# Patient Record
Sex: Male | Born: 1950
Health system: Southern US, Community
[De-identification: ages and names within clinical notes are randomized; demographics above are authoritative.]

## PROBLEM LIST (undated history)

## (undated) DIAGNOSIS — Z9289 Personal history of other medical treatment: Secondary | ICD-10-CM

## (undated) DIAGNOSIS — R0600 Dyspnea, unspecified: Secondary | ICD-10-CM

## (undated) DIAGNOSIS — I2510 Atherosclerotic heart disease of native coronary artery without angina pectoris: Secondary | ICD-10-CM

## (undated) DIAGNOSIS — S0590XA Unspecified injury of unspecified eye and orbit, initial encounter: Secondary | ICD-10-CM

## (undated) DIAGNOSIS — I5042 Chronic combined systolic (congestive) and diastolic (congestive) heart failure: Secondary | ICD-10-CM

## (undated) DIAGNOSIS — I4821 Permanent atrial fibrillation: Secondary | ICD-10-CM

## (undated) DIAGNOSIS — L02419 Cutaneous abscess of limb, unspecified: Secondary | ICD-10-CM

## (undated) DIAGNOSIS — E1165 Type 2 diabetes mellitus with hyperglycemia: Secondary | ICD-10-CM

## (undated) DIAGNOSIS — I428 Other cardiomyopathies: Secondary | ICD-10-CM

## (undated) DIAGNOSIS — Z9989 Dependence on other enabling machines and devices: Secondary | ICD-10-CM

## (undated) DIAGNOSIS — I10 Essential (primary) hypertension: Secondary | ICD-10-CM

## (undated) DIAGNOSIS — N390 Urinary tract infection, site not specified: Secondary | ICD-10-CM

## (undated) DIAGNOSIS — Z789 Other specified health status: Secondary | ICD-10-CM

## (undated) DIAGNOSIS — T4145XA Adverse effect of unspecified anesthetic, initial encounter: Secondary | ICD-10-CM

## (undated) DIAGNOSIS — M1990 Unspecified osteoarthritis, unspecified site: Secondary | ICD-10-CM

## (undated) DIAGNOSIS — L03119 Cellulitis of unspecified part of limb: Secondary | ICD-10-CM

## (undated) DIAGNOSIS — G4733 Obstructive sleep apnea (adult) (pediatric): Secondary | ICD-10-CM

## (undated) HISTORY — DX: Type 2 diabetes mellitus with hyperglycemia: E11.65

## (undated) HISTORY — DX: Cutaneous abscess of limb, unspecified: L02.419

## (undated) HISTORY — PX: PLACEMENT AND SUTURE OF SECONDARY INTRAOCULAR LENS: SHX5338

## (undated) HISTORY — PX: EYE MUSCLE SURGERY: SHX370

## (undated) HISTORY — DX: Urinary tract infection, site not specified: N39.0

## (undated) HISTORY — PX: TONSILLECTOMY: SUR1361

## (undated) HISTORY — DX: Unspecified injury of unspecified eye and orbit, initial encounter: S05.90XA

## (undated) HISTORY — DX: Cellulitis of unspecified part of limb: L03.119

## (undated) HISTORY — PX: RETINAL DETACHMENT SURGERY: SHX105

## (undated) HISTORY — PX: CARDIOVERSION: SHX1299

## (undated) HISTORY — PX: CORNEAL TRANSPLANT: SHX108

---

## 2005-03-08 HISTORY — PX: EYE SURGERY: SHX253

## 2005-03-11 ENCOUNTER — Observation Stay (HOSPITAL_COMMUNITY): Admission: EM | Admit: 2005-03-11 | Discharge: 2005-03-12 | Payer: Self-pay | Admitting: Emergency Medicine

## 2005-03-31 ENCOUNTER — Ambulatory Visit (HOSPITAL_COMMUNITY): Admission: RE | Admit: 2005-03-31 | Discharge: 2005-04-01 | Payer: Self-pay | Admitting: Ophthalmology

## 2008-08-04 ENCOUNTER — Ambulatory Visit: Payer: Self-pay | Admitting: *Deleted

## 2008-08-04 ENCOUNTER — Ambulatory Visit: Payer: Self-pay | Admitting: Family Medicine

## 2008-08-04 ENCOUNTER — Inpatient Hospital Stay (HOSPITAL_COMMUNITY): Admission: EM | Admit: 2008-08-04 | Discharge: 2008-08-13 | Payer: Self-pay | Admitting: Emergency Medicine

## 2008-08-04 DIAGNOSIS — I4821 Permanent atrial fibrillation: Secondary | ICD-10-CM

## 2008-08-04 DIAGNOSIS — L03119 Cellulitis of unspecified part of limb: Secondary | ICD-10-CM

## 2008-08-04 DIAGNOSIS — N390 Urinary tract infection, site not specified: Secondary | ICD-10-CM

## 2008-08-04 DIAGNOSIS — L02419 Cutaneous abscess of limb, unspecified: Secondary | ICD-10-CM

## 2008-08-04 DIAGNOSIS — R188 Other ascites: Secondary | ICD-10-CM

## 2008-08-04 DIAGNOSIS — E1159 Type 2 diabetes mellitus with other circulatory complications: Secondary | ICD-10-CM

## 2008-08-04 DIAGNOSIS — IMO0001 Reserved for inherently not codable concepts without codable children: Secondary | ICD-10-CM

## 2008-08-04 DIAGNOSIS — I4891 Unspecified atrial fibrillation: Secondary | ICD-10-CM

## 2008-08-04 HISTORY — DX: Cutaneous abscess of limb, unspecified: L02.419

## 2008-08-04 HISTORY — DX: Urinary tract infection, site not specified: N39.0

## 2008-08-04 HISTORY — DX: Reserved for inherently not codable concepts without codable children: IMO0001

## 2008-08-04 HISTORY — DX: Permanent atrial fibrillation: I48.21

## 2008-08-04 LAB — CONVERTED CEMR LAB
Glucose, Urine, Semiquant: 100
Protein, U semiquant: >=300
Specific Gravity, Urine: >=1.03
Urobilinogen, UA: >=8

## 2008-08-05 ENCOUNTER — Encounter: Payer: Self-pay | Admitting: Family Medicine

## 2008-08-07 ENCOUNTER — Encounter (INDEPENDENT_AMBULATORY_CARE_PROVIDER_SITE_OTHER): Payer: Self-pay | Admitting: *Deleted

## 2008-08-12 ENCOUNTER — Encounter: Payer: Self-pay | Admitting: Cardiology

## 2008-09-16 ENCOUNTER — Inpatient Hospital Stay (HOSPITAL_COMMUNITY): Admission: AD | Admit: 2008-09-16 | Discharge: 2008-09-19 | Payer: Self-pay | Admitting: Cardiology

## 2008-11-21 ENCOUNTER — Ambulatory Visit (HOSPITAL_COMMUNITY): Admission: RE | Admit: 2008-11-21 | Discharge: 2008-11-21 | Payer: Self-pay | Admitting: Ophthalmology

## 2009-08-04 IMAGING — CR DG CHEST 2V
2 series · 2 of 2 positions shown · non-contrast
Comparison: 08/04/2008 and earlier.

CLINICAL DATA: 57-year-old male with atrial fibrillation.

CHEST - 2 VIEW

[w chest pa *]
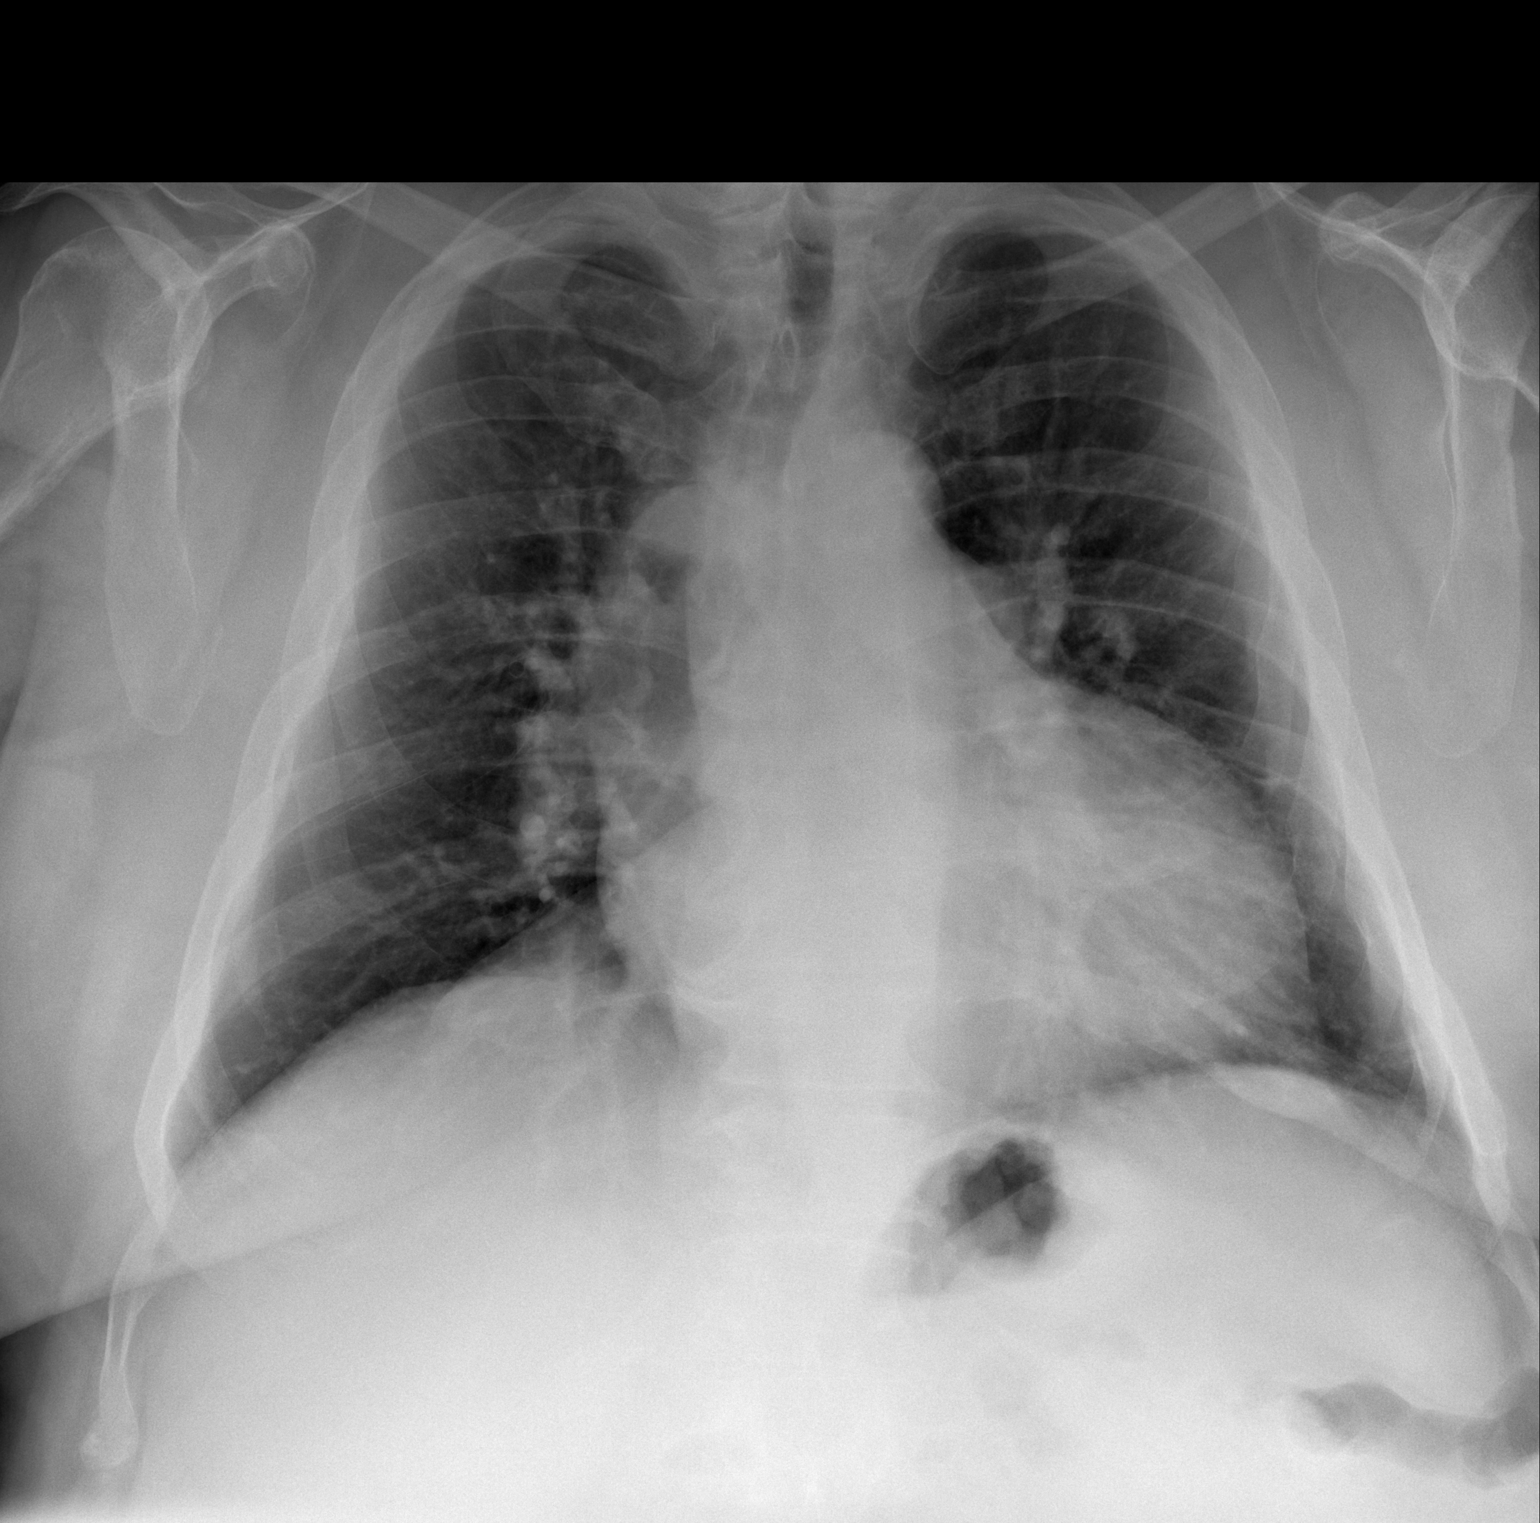

[w chest lat *]
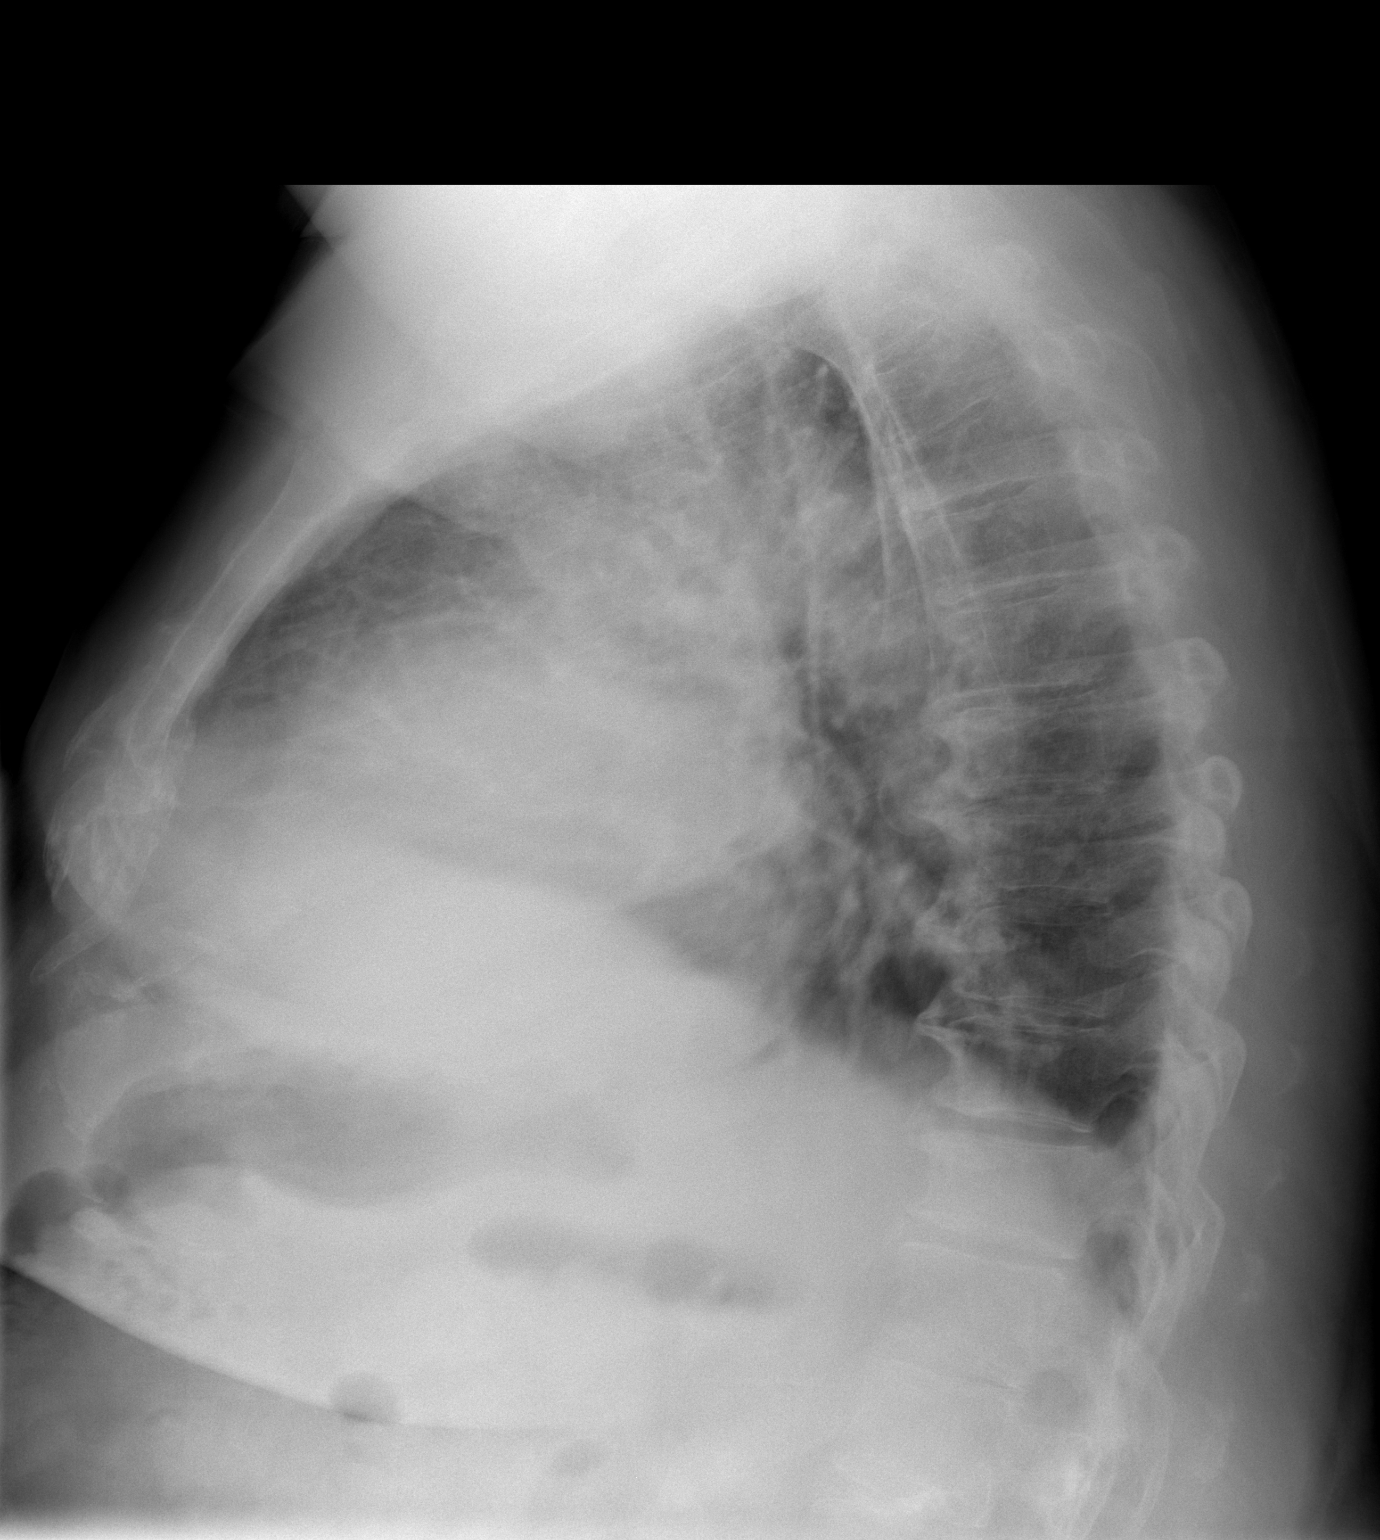

[2 of 2 positions shown; findings below may reference images not displayed]

FINDINGS: Stable mild progression of cardiomegaly and 6228.
Otherwise, normal mediastinal contours.  Low lung volume and
respiratory motion artifact on the lateral view. Visualized
tracheal air column is within normal limits.  No pneumothorax,
pulmonary edema, pleural effusion, consolidation, or confluent
airspace opacity. Stable visualized osseous structures.
IMPRESSION: Cardiomegaly.  No acute cardiopulmonary abnormality.

## 2010-01-08 ENCOUNTER — Ambulatory Visit: Payer: Self-pay | Admitting: Cardiology

## 2010-01-08 ENCOUNTER — Inpatient Hospital Stay (HOSPITAL_COMMUNITY): Admission: EM | Admit: 2010-01-08 | Discharge: 2010-01-11 | Payer: Self-pay | Admitting: Emergency Medicine

## 2010-05-17 ENCOUNTER — Emergency Department (HOSPITAL_COMMUNITY): Payer: 59

## 2010-05-17 ENCOUNTER — Emergency Department (HOSPITAL_COMMUNITY)
Admission: EM | Admit: 2010-05-17 | Discharge: 2010-05-17 | Disposition: A | Discharge status: Home / Self Care | Payer: 59 | Attending: Emergency Medicine | Admitting: Emergency Medicine

## 2010-05-17 DIAGNOSIS — I4891 Unspecified atrial fibrillation: Secondary | ICD-10-CM | POA: Insufficient documentation

## 2010-05-17 DIAGNOSIS — M549 Dorsalgia, unspecified: Secondary | ICD-10-CM | POA: Insufficient documentation

## 2010-05-17 DIAGNOSIS — B029 Zoster without complications: Secondary | ICD-10-CM | POA: Insufficient documentation

## 2010-05-17 DIAGNOSIS — R079 Chest pain, unspecified: Secondary | ICD-10-CM | POA: Insufficient documentation

## 2010-05-17 DIAGNOSIS — Z79899 Other long term (current) drug therapy: Secondary | ICD-10-CM | POA: Insufficient documentation

## 2010-05-17 DIAGNOSIS — E119 Type 2 diabetes mellitus without complications: Secondary | ICD-10-CM | POA: Insufficient documentation

## 2010-05-19 LAB — COMPREHENSIVE METABOLIC PANEL
AST: 36 U/L (ref 0–37)
BUN: 23 mg/dL (ref 6–23)
CO2: 23 mEq/L (ref 19–32)
Chloride: 104 mEq/L (ref 96–112)
Creatinine, Ser: 1.03 mg/dL (ref 0.4–1.5)
GFR calc Af Amer: >60 mL/min (ref >60)
GFR calc non Af Amer: >60 mL/min (ref >60)
Glucose, Bld: 142 mg/dL — ABNORMAL HIGH (ref 70–99)
Total Bilirubin: 2.3 mg/dL — ABNORMAL HIGH (ref 0.3–1.2)

## 2010-05-19 LAB — CBC
HCT: 47.1 % (ref 39.0–52.0)
Hemoglobin: 16.4 g/dL (ref 13.0–17.0)
MCH: 32.7 pg (ref 26.0–34.0)
MCHC: 34.8 g/dL (ref 30.0–36.0)
MCV: 94 fL (ref 78.0–100.0)
Platelets: 256 K/uL (ref 150–400)
RBC: 5.01 MIL/uL (ref 4.22–5.81)
RDW: 14.8 % (ref 11.5–15.5)
WBC: 8.8 K/uL (ref 4.0–10.5)

## 2010-05-19 LAB — URINE MICROSCOPIC-ADD ON

## 2010-05-19 LAB — DIFFERENTIAL
Basophils Absolute: 0 10*3/uL (ref 0.0–0.1)
Basophils Relative: 0 % (ref 0–1)
Eosinophils Absolute: 0 K/uL (ref 0.0–0.7)
Eosinophils Relative: 1 % (ref 0–5)
Lymphocytes Relative: 19 % (ref 12–46)
Lymphs Abs: 1.7 K/uL (ref 0.7–4.0)
Monocytes Absolute: 1.2 K/uL — ABNORMAL HIGH (ref 0.1–1.0)
Monocytes Relative: 14 % — ABNORMAL HIGH (ref 3–12)
Neutro Abs: 5.8 K/uL (ref 1.7–7.7)
Neutrophils Relative %: 66 % (ref 43–77)

## 2010-05-19 LAB — CARDIAC PANEL(CRET KIN+CKTOT+MB+TROPI)
CK, MB: 4.7 ng/mL — ABNORMAL HIGH (ref 0.3–4.0)
Relative Index: 2.5 (ref 0.0–2.5)
Relative Index: 2.5 (ref 0.0–2.5)
Troponin I: 0.01 ng/mL (ref 0.00–0.06)

## 2010-05-19 LAB — BASIC METABOLIC PANEL
BUN: 12 mg/dL (ref 6–23)
Calcium: 8.5 mg/dL (ref 8.4–10.5)
Calcium: 8.7 mg/dL (ref 8.4–10.5)
Chloride: 102 mEq/L (ref 96–112)
Chloride: 103 mEq/L (ref 96–112)
Creatinine, Ser: 0.88 mg/dL (ref 0.4–1.5)
Creatinine, Ser: 0.89 mg/dL (ref 0.4–1.5)
GFR calc Af Amer: >60 mL/min (ref >60)
GFR calc non Af Amer: >60 mL/min (ref >60)
Glucose, Bld: 110 mg/dL — ABNORMAL HIGH (ref 70–99)
Glucose, Bld: 94 mg/dL (ref 70–99)
Potassium: 3.5 mEq/L (ref 3.5–5.1)
Potassium: 3.6 mEq/L (ref 3.5–5.1)
Sodium: 136 mEq/L (ref 135–145)
Sodium: 138 mEq/L (ref 135–145)

## 2010-05-19 LAB — TROPONIN I: Troponin I: 0.02 ng/mL (ref 0.00–0.06)

## 2010-05-19 LAB — URINALYSIS, ROUTINE W REFLEX MICROSCOPIC
Bilirubin Urine: NEGATIVE
Glucose, UA: NEGATIVE mg/dL
Ketones, ur: NEGATIVE mg/dL
Leukocytes, UA: NEGATIVE
Nitrite: NEGATIVE
Protein, ur: 30 mg/dL — AB
Specific Gravity, Urine: 1.015 (ref 1.005–1.030)
Urobilinogen, UA: 1 mg/dL (ref 0.0–1.0)
pH: 5 (ref 5.0–8.0)

## 2010-05-19 LAB — GLUCOSE, CAPILLARY
Glucose-Capillary: 103 mg/dL — ABNORMAL HIGH (ref 70–99)
Glucose-Capillary: 104 mg/dL — ABNORMAL HIGH (ref 70–99)
Glucose-Capillary: 122 mg/dL — ABNORMAL HIGH (ref 70–99)
Glucose-Capillary: 132 mg/dL — ABNORMAL HIGH (ref 70–99)
Glucose-Capillary: 133 mg/dL — ABNORMAL HIGH (ref 70–99)
Glucose-Capillary: 140 mg/dL — ABNORMAL HIGH (ref 70–99)
Glucose-Capillary: 212 mg/dL — ABNORMAL HIGH (ref 70–99)

## 2010-05-19 LAB — CK TOTAL AND CKMB (NOT AT ARMC)
CK, MB: 4.7 ng/mL — ABNORMAL HIGH (ref 0.3–4.0)
Total CK: 157 U/L (ref 7–232)

## 2010-05-19 LAB — POCT CARDIAC MARKERS

## 2010-05-19 LAB — COMPREHENSIVE METABOLIC PANEL WITH GFR
ALT: 29 U/L (ref 0–53)
Albumin: 3.4 g/dL — ABNORMAL LOW (ref 3.5–5.2)
Alkaline Phosphatase: 73 U/L (ref 39–117)
Calcium: 8.7 mg/dL (ref 8.4–10.5)
Potassium: 3.8 meq/L (ref 3.5–5.1)
Sodium: 136 meq/L (ref 135–145)
Total Protein: 6.5 g/dL (ref 6.0–8.3)

## 2010-05-19 LAB — PROTIME-INR
INR: 5.84 (ref 0.00–1.49)
INR: 7.72 (ref 0.00–1.49)
Prothrombin Time: 52.1 seconds — ABNORMAL HIGH (ref 11.6–15.2)
Prothrombin Time: 64.6 seconds — ABNORMAL HIGH (ref 11.6–15.2)

## 2010-05-19 LAB — APTT: aPTT: 53 s — ABNORMAL HIGH (ref 24–37)

## 2010-05-19 LAB — BRAIN NATRIURETIC PEPTIDE: Pro B Natriuretic peptide (BNP): 457 pg/mL — ABNORMAL HIGH (ref 0.0–100.0)

## 2010-06-12 LAB — CBC
MCHC: 33.8 g/dL (ref 30.0–36.0)
RBC: 4.98 MIL/uL (ref 4.22–5.81)
WBC: 9.7 10*3/uL (ref 4.0–10.5)

## 2010-06-12 LAB — APTT: aPTT: 31 seconds (ref 24–37)

## 2010-06-12 LAB — GLUCOSE, CAPILLARY
Glucose-Capillary: 102 mg/dL — ABNORMAL HIGH (ref 70–99)
Glucose-Capillary: 152 mg/dL — ABNORMAL HIGH (ref 70–99)
Glucose-Capillary: 152 mg/dL — ABNORMAL HIGH (ref 70–99)

## 2010-06-12 LAB — BASIC METABOLIC PANEL
Calcium: 9.8 mg/dL (ref 8.4–10.5)
Creatinine, Ser: 0.87 mg/dL (ref 0.4–1.5)
GFR calc Af Amer: >60 mL/min (ref >60)
GFR calc non Af Amer: >60 mL/min (ref >60)

## 2010-06-12 LAB — PROTIME-INR
INR: 1.1 (ref 0.00–1.49)
Prothrombin Time: 13.8 seconds (ref 11.6–15.2)

## 2010-06-14 LAB — GLUCOSE, CAPILLARY
Glucose-Capillary: 107 mg/dL — ABNORMAL HIGH (ref 70–99)
Glucose-Capillary: 125 mg/dL — ABNORMAL HIGH (ref 70–99)
Glucose-Capillary: 128 mg/dL — ABNORMAL HIGH (ref 70–99)
Glucose-Capillary: 171 mg/dL — ABNORMAL HIGH (ref 70–99)

## 2010-06-14 LAB — COMPREHENSIVE METABOLIC PANEL
Albumin: 3.6 g/dL (ref 3.5–5.2)
Alkaline Phosphatase: 62 U/L (ref 39–117)
BUN: 11 mg/dL (ref 6–23)
Creatinine, Ser: 0.89 mg/dL (ref 0.4–1.5)
Potassium: 4.2 mEq/L (ref 3.5–5.1)
Total Protein: 8.1 g/dL (ref 6.0–8.3)

## 2010-06-14 LAB — CBC
HCT: 45.1 % (ref 39.0–52.0)
Platelets: 286 10*3/uL (ref 150–400)
RDW: 13.3 % (ref 11.5–15.5)

## 2010-06-14 LAB — PROTIME-INR
INR: 2.7 — ABNORMAL HIGH (ref 0.00–1.49)
INR: 2.7 — ABNORMAL HIGH (ref 0.00–1.49)
Prothrombin Time: 30.3 seconds — ABNORMAL HIGH (ref 11.6–15.2)
Prothrombin Time: 30.8 seconds — ABNORMAL HIGH (ref 11.6–15.2)
Prothrombin Time: 33.3 seconds — ABNORMAL HIGH (ref 11.6–15.2)

## 2010-06-14 LAB — BRAIN NATRIURETIC PEPTIDE: Pro B Natriuretic peptide (BNP): 81 pg/mL (ref 0.0–100.0)

## 2010-06-15 LAB — BASIC METABOLIC PANEL
BUN: 8 mg/dL (ref 6–23)
BUN: 9 mg/dL (ref 6–23)
BUN: 9 mg/dL (ref 6–23)
BUN: 9 mg/dL (ref 6–23)
BUN: 9 mg/dL (ref 6–23)
CO2: 30 mEq/L (ref 19–32)
CO2: 32 mEq/L (ref 19–32)
Calcium: 8.9 mg/dL (ref 8.4–10.5)
Calcium: 8.9 mg/dL (ref 8.4–10.5)
Calcium: 9.4 mg/dL (ref 8.4–10.5)
Calcium: 9.6 mg/dL (ref 8.4–10.5)
Chloride: 100 mEq/L (ref 96–112)
Chloride: 102 mEq/L (ref 96–112)
Chloride: 102 mEq/L (ref 96–112)
Chloride: 95 mEq/L — ABNORMAL LOW (ref 96–112)
Creatinine, Ser: 0.87 mg/dL (ref 0.4–1.5)
Creatinine, Ser: 0.91 mg/dL (ref 0.4–1.5)
Creatinine, Ser: 0.91 mg/dL (ref 0.4–1.5)
Creatinine, Ser: 0.94 mg/dL (ref 0.4–1.5)
Creatinine, Ser: 0.97 mg/dL (ref 0.4–1.5)
Creatinine, Ser: 0.99 mg/dL (ref 0.4–1.5)
GFR calc Af Amer: >60 mL/min (ref >60)
GFR calc Af Amer: >60 mL/min (ref >60)
GFR calc Af Amer: >60 mL/min (ref >60)
GFR calc Af Amer: >60 mL/min (ref >60)
GFR calc Af Amer: >60 mL/min (ref >60)
GFR calc Af Amer: >60 mL/min (ref >60)
GFR calc non Af Amer: >60 mL/min (ref >60)
GFR calc non Af Amer: >60 mL/min (ref >60)
GFR calc non Af Amer: >60 mL/min (ref >60)
GFR calc non Af Amer: >60 mL/min (ref >60)
GFR calc non Af Amer: >60 mL/min (ref >60)
Glucose, Bld: 110 mg/dL — ABNORMAL HIGH (ref 70–99)
Glucose, Bld: 116 mg/dL — ABNORMAL HIGH (ref 70–99)
Glucose, Bld: 157 mg/dL — ABNORMAL HIGH (ref 70–99)
Potassium: 3.6 mEq/L (ref 3.5–5.1)
Potassium: 3.9 mEq/L (ref 3.5–5.1)
Potassium: 4.1 mEq/L (ref 3.5–5.1)
Sodium: 138 mEq/L (ref 135–145)
Sodium: 141 mEq/L (ref 135–145)

## 2010-06-15 LAB — GLUCOSE, CAPILLARY
Glucose-Capillary: 114 mg/dL — ABNORMAL HIGH (ref 70–99)
Glucose-Capillary: 114 mg/dL — ABNORMAL HIGH (ref 70–99)
Glucose-Capillary: 118 mg/dL — ABNORMAL HIGH (ref 70–99)
Glucose-Capillary: 118 mg/dL — ABNORMAL HIGH (ref 70–99)
Glucose-Capillary: 121 mg/dL — ABNORMAL HIGH (ref 70–99)
Glucose-Capillary: 121 mg/dL — ABNORMAL HIGH (ref 70–99)
Glucose-Capillary: 122 mg/dL — ABNORMAL HIGH (ref 70–99)
Glucose-Capillary: 122 mg/dL — ABNORMAL HIGH (ref 70–99)
Glucose-Capillary: 133 mg/dL — ABNORMAL HIGH (ref 70–99)
Glucose-Capillary: 134 mg/dL — ABNORMAL HIGH (ref 70–99)
Glucose-Capillary: 136 mg/dL — ABNORMAL HIGH (ref 70–99)
Glucose-Capillary: 137 mg/dL — ABNORMAL HIGH (ref 70–99)
Glucose-Capillary: 150 mg/dL — ABNORMAL HIGH (ref 70–99)

## 2010-06-15 LAB — CBC
HCT: 42.5 % (ref 39.0–52.0)
HCT: 44.5 % (ref 39.0–52.0)
HCT: 45.1 % (ref 39.0–52.0)
MCHC: 33.6 g/dL (ref 30.0–36.0)
MCHC: 33.9 g/dL (ref 30.0–36.0)
MCV: 93.7 fL (ref 78.0–100.0)
MCV: 94.8 fL (ref 78.0–100.0)
Platelets: 205 10*3/uL (ref 150–400)
Platelets: 229 10*3/uL (ref 150–400)
Platelets: 237 10*3/uL (ref 150–400)
Platelets: 240 10*3/uL (ref 150–400)
Platelets: 241 10*3/uL (ref 150–400)
Platelets: 244 10*3/uL (ref 150–400)
Platelets: 251 10*3/uL (ref 150–400)
RBC: 4.38 MIL/uL (ref 4.22–5.81)
RBC: 4.51 MIL/uL (ref 4.22–5.81)
RBC: 4.7 MIL/uL (ref 4.22–5.81)
RDW: 14 % (ref 11.5–15.5)
RDW: 14 % (ref 11.5–15.5)
RDW: 14.2 % (ref 11.5–15.5)
WBC: 6.6 10*3/uL (ref 4.0–10.5)
WBC: 6.6 10*3/uL (ref 4.0–10.5)
WBC: 7.4 10*3/uL (ref 4.0–10.5)
WBC: 7.4 10*3/uL (ref 4.0–10.5)
WBC: 7.5 10*3/uL (ref 4.0–10.5)
WBC: 7.8 10*3/uL (ref 4.0–10.5)

## 2010-06-15 LAB — HEPARIN LEVEL (UNFRACTIONATED)
Heparin Unfractionated: 0.25 IU/mL — ABNORMAL LOW (ref 0.30–0.70)
Heparin Unfractionated: 0.35 IU/mL (ref 0.30–0.70)
Heparin Unfractionated: 0.36 IU/mL (ref 0.30–0.70)
Heparin Unfractionated: 0.37 IU/mL (ref 0.30–0.70)
Heparin Unfractionated: 0.38 IU/mL (ref 0.30–0.70)
Heparin Unfractionated: 0.39 IU/mL (ref 0.30–0.70)
Heparin Unfractionated: 0.55 IU/mL (ref 0.30–0.70)
Heparin Unfractionated: 0.73 IU/mL — ABNORMAL HIGH (ref 0.30–0.70)

## 2010-06-15 LAB — PROTIME-INR
INR: 1.2 (ref 0.00–1.49)
INR: 1.3 (ref 0.00–1.49)
INR: 1.7 — ABNORMAL HIGH (ref 0.00–1.49)
INR: 1.8 — ABNORMAL HIGH (ref 0.00–1.49)
Prothrombin Time: 17.1 seconds — ABNORMAL HIGH (ref 11.6–15.2)
Prothrombin Time: 17.9 seconds — ABNORMAL HIGH (ref 11.6–15.2)
Prothrombin Time: 22 seconds — ABNORMAL HIGH (ref 11.6–15.2)

## 2010-06-15 LAB — BRAIN NATRIURETIC PEPTIDE
Pro B Natriuretic peptide (BNP): 126 pg/mL — ABNORMAL HIGH (ref 0.0–100.0)
Pro B Natriuretic peptide (BNP): 137 pg/mL — ABNORMAL HIGH (ref 0.0–100.0)
Pro B Natriuretic peptide (BNP): 141 pg/mL — ABNORMAL HIGH (ref 0.0–100.0)
Pro B Natriuretic peptide (BNP): 157 pg/mL — ABNORMAL HIGH (ref 0.0–100.0)

## 2010-06-16 LAB — LIPID PANEL
HDL: 28 mg/dL — ABNORMAL LOW (ref >39)
Total CHOL/HDL Ratio: 4.5 RATIO
VLDL: 9 mg/dL (ref 0–40)

## 2010-06-16 LAB — POCT I-STAT, CHEM 8
BUN: 17 mg/dL (ref 6–23)
Calcium, Ion: 1.08 mmol/L — ABNORMAL LOW (ref 1.12–1.32)
Chloride: 105 mEq/L (ref 96–112)
Creatinine, Ser: 0.9 mg/dL (ref 0.4–1.5)
Glucose, Bld: 156 mg/dL — ABNORMAL HIGH (ref 70–99)
HCT: 49 % (ref 39.0–52.0)
Hemoglobin: 16.7 g/dL (ref 13.0–17.0)
Potassium: 4.5 mEq/L (ref 3.5–5.1)
Sodium: 140 mEq/L (ref 135–145)
TCO2: 25 mmol/L (ref 0–100)

## 2010-06-16 LAB — DIFFERENTIAL
Basophils Absolute: 0.1 10*3/uL (ref 0.0–0.1)
Basophils Relative: 1 % (ref 0–1)
Eosinophils Absolute: 0.1 10*3/uL (ref 0.0–0.7)
Eosinophils Relative: 2 % (ref 0–5)
Lymphocytes Relative: 20 % (ref 12–46)
Lymphs Abs: 1.4 10*3/uL (ref 0.7–4.0)
Monocytes Absolute: 0.7 10*3/uL (ref 0.1–1.0)
Monocytes Relative: 10 % (ref 3–12)
Neutro Abs: 4.8 10*3/uL (ref 1.7–7.7)
Neutrophils Relative %: 67 % (ref 43–77)

## 2010-06-16 LAB — URINALYSIS, ROUTINE W REFLEX MICROSCOPIC
Hgb urine dipstick: NEGATIVE
Ketones, ur: 15 mg/dL — AB
Nitrite: POSITIVE — AB
Specific Gravity, Urine: 1.033 — ABNORMAL HIGH (ref 1.005–1.030)
Urobilinogen, UA: 2 mg/dL — ABNORMAL HIGH (ref 0.0–1.0)

## 2010-06-16 LAB — URINALYSIS, MICROSCOPIC ONLY
Leukocytes, UA: NEGATIVE
Nitrite: POSITIVE — AB
Specific Gravity, Urine: 1.023 (ref 1.005–1.030)
Urobilinogen, UA: 1 mg/dL (ref 0.0–1.0)

## 2010-06-16 LAB — HEPATIC FUNCTION PANEL
ALT: 31 U/L (ref 0–53)
AST: 27 U/L (ref 0–37)
Albumin: 3 g/dL — ABNORMAL LOW (ref 3.5–5.2)
Alkaline Phosphatase: 61 U/L (ref 39–117)
Bilirubin, Direct: 0.5 mg/dL — ABNORMAL HIGH (ref 0.0–0.3)
Indirect Bilirubin: 1.1 mg/dL — ABNORMAL HIGH (ref 0.3–0.9)
Total Bilirubin: 1.6 mg/dL — ABNORMAL HIGH (ref 0.3–1.2)
Total Protein: 6.1 g/dL (ref 6.0–8.3)

## 2010-06-16 LAB — CBC
HCT: 46 % (ref 39.0–52.0)
Hemoglobin: 15.5 g/dL (ref 13.0–17.0)
MCHC: 33.4 g/dL (ref 30.0–36.0)
MCHC: 33.7 g/dL (ref 30.0–36.0)
MCV: 93.7 fL (ref 78.0–100.0)
MCV: 94 fL (ref 78.0–100.0)
Platelets: 239 10*3/uL (ref 150–400)
Platelets: 252 10*3/uL (ref 150–400)
RBC: 4.9 MIL/uL (ref 4.22–5.81)
RDW: 13.9 % (ref 11.5–15.5)
WBC: 7.2 10*3/uL (ref 4.0–10.5)
WBC: 8.5 10*3/uL (ref 4.0–10.5)

## 2010-06-16 LAB — URINE MICROSCOPIC-ADD ON

## 2010-06-16 LAB — BASIC METABOLIC PANEL
BUN: 10 mg/dL (ref 6–23)
CO2: 25 mEq/L (ref 19–32)
Calcium: 8.4 mg/dL (ref 8.4–10.5)
Chloride: 105 mEq/L (ref 96–112)
Creatinine, Ser: 0.8 mg/dL (ref 0.4–1.5)
GFR calc Af Amer: >60 mL/min (ref >60)

## 2010-06-16 LAB — URINE CULTURE

## 2010-06-16 LAB — GLUCOSE, CAPILLARY
Glucose-Capillary: 141 mg/dL — ABNORMAL HIGH (ref 70–99)
Glucose-Capillary: 147 mg/dL — ABNORMAL HIGH (ref 70–99)
Glucose-Capillary: 165 mg/dL — ABNORMAL HIGH (ref 70–99)

## 2010-06-16 LAB — POCT CARDIAC MARKERS
CKMB, poc: 1.6 ng/mL (ref 1.0–8.0)
Myoglobin, poc: 90.3 ng/mL (ref 12–200)
Troponin i, poc: <0.05 ng/mL (ref 0.00–0.09)

## 2010-06-16 LAB — HEMOGLOBIN A1C
Hgb A1c MFr Bld: 9.5 % — ABNORMAL HIGH (ref 4.6–6.1)
Mean Plasma Glucose: 226 mg/dL

## 2010-06-16 LAB — BLOOD GAS, ARTERIAL
Acid-Base Excess: 0.4 mmol/L (ref 0.0–2.0)
Drawn by: 277331
FIO2: 0.21 %
O2 Saturation: 94 %
Patient temperature: 98.6

## 2010-06-16 LAB — PROTIME-INR
INR: 1.2 (ref 0.00–1.49)
Prothrombin Time: 15.4 seconds — ABNORMAL HIGH (ref 11.6–15.2)

## 2010-06-16 LAB — APTT: aPTT: 30 seconds (ref 24–37)

## 2010-07-21 NOTE — Discharge Summary (Signed)
NAME:  Raymond Pratt, Raymond Pratt NO.:  [id number]   MEDICAL RECORD NO.:  [id number]          PATIENT TYPE:  INP   LOCATION:  2005                         FACILITY:  [facility]   PHYSICIAN:  Ludwig Lean. Doreatha Lew, M.D.DATE OF BIRTH:  January 23, 1951   DATE OF ADMISSION:  09/16/2008  DATE OF DISCHARGE:  09/19/2008                               DISCHARGE SUMMARY   DISCHARGE DIAGNOSES:  1. Atrial fibrillation with subsequent failed cardioversion.  The      patient will be managed with rate control and chronic Coumadin      anticoagulation.  2. Coumadin anticoagulation.  His INR at discharge is 3.1.  3. History of volume overload/cor pulmonale.  4. Diabetes.  5. History of urinary tract infection.  6. Left eye surgery x4 due to a past injury with a nail gun.  7. Sleep apnea.  8. Cardiac enlargement.  9. Previous failed TEE cardioversion in May 2010.  10.Lone run of [place], asymptomatic.   HISTORY OF PRESENT ILLNESS:  Raymond Pratt is a very pleasant 60 year old  morbidly obese white male who presents for initiation of antiarrhythmic  therapy.  He had presented with volume overload towards the latter part  of May and at that time was found to be in atrial fibrillation.  The  exact duration was unknown.  He had unsuccessful attempts at TEE  cardioversion at that time.  He had been maintained on Coumadin.  He  presented for drug loading with plans to repeat his cardioversion with  the hopes to restore sinus rhythm.  Clinically, he has done very well.  He is diuresed nicely.  He is no longer short of breath.  He is not  lightheaded or dizzy.   Please see the history and physical for further patient presentation and  profile.   LABORATORY DATA:  His INR on admission was 2.7.  Digoxin level was 0.3.  BNP was 81.  His CBC was normal.  His chemistries were normal except for  a glucose of 139.   His chest x-ray on admission showed cardiomegaly with no acute  abnormality.   HOSPITAL COURSE:  The  patient was admitted electively.  He was started  on sotalol.  He tolerated that without any known problems.  He underwent  repeat cardioversion on September 18, 2008.  He did have sinus rhythm, but  only held this rhythm for approximately 2 minutes and then reverted back  to atrial fibrillation.  His sotalol was subsequently discontinued.  He  was watched overnight.  He has had one short run of ventricular  tachycardia during this admission with no recurrence..  Today, on September 19, 2008, he is doing well without complaints.  He is felt to be a  satisfactory candidate for discharge today.   DISCHARGE CONDITION:  Stable.   DISCHARGE DIET:  Diabetic heart-healthy.   DISCHARGE MEDICINES:  1. Lasix 60 mg 2 times a day.  2. Potassium 20 mEq a day.  3. Januvia 100 mg a day.  4. Metformin 500 mg b.i.d.  5. Coumadin 7.5 alternating with 5 mg.  6. Tylenol p.r.n.  7. Diltiazem 90 mg 4  times a day.  8. Lanoxin 0.125 daily.  9. Eye drops as he was taking before.   I plan on seeing him back in the office in approximately 2 weeks,  certainly sooner if any problems arise in the interim.   Greater than 30 minutes spent for discharge.      [name], N.P.      Ludwig Lean. Doreatha Lew, M.D.  Electronically Signed    LC/MEDQ  D:  09/19/2008  T:  09/19/2008  Job:  [id number]   cc:   Precious Reel, MD

## 2010-07-21 NOTE — Op Note (Signed)
NAME:  Raymond Pratt, BEAS NO.:  [id number]   MEDICAL RECORD NO.:  [id number]          PATIENT TYPE:  INP   LOCATION:  2005                         FACILITY:  [facility]   PHYSICIAN:  Ludwig Lean. Doreatha Lew, M.D.DATE OF BIRTH:  05/23/1950   DATE OF PROCEDURE:  09/18/2008  DATE OF DISCHARGE:                               OPERATIVE REPORT   PROCEDURE:  Cardioversion.   ANESTHESIA:  Crissie Sickles. Ossey, MD with propofol 150 mcg IV.   Using anterior pressure paddles, 200 watt-seconds biphasic energy were  delivered with conversion to normal sinus rhythm.  The patient remained  in sinus rhythm for approximately 2 minutes and then spontaneously  converted back to atrial fibrillation with controlled ventricular  response of approximately 80-90 beats per minute.  It is felt the  patient will need to be managed in chronic atrial fibrillation.  Sotalol  will be discontinued at this point in time.      Ludwig Lean. Doreatha Lew, M.D.  Electronically Signed     SNT/MEDQ  D:  09/18/2008  T:  09/18/2008  Job:  [id number]

## 2010-07-21 NOTE — Discharge Summary (Signed)
NAME:  Raymond Pratt, DEJONGH NO.:  [id number]   MEDICAL RECORD NO.:  [id number]          PATIENT TYPE:  INP   LOCATION:  4715                         FACILITY:  [facility]   PHYSICIAN:  Ludwig Lean. Doreatha Lew, M.D.DATE OF BIRTH:  01-16-1951   DATE OF ADMISSION:  08/04/2008  DATE OF DISCHARGE:  08/13/2008                               DISCHARGE SUMMARY   DISCHARGE DIAGNOSES:  1. Atrial fibrillation with failed attempt at cardioversion.  2. Coumadin anticoagulation.  His INR at discharge is 1.8.  3. Volume overload/cor pulmonale.  Weight is currently 140.7 kg.  4. Diabetes, uncontrolled with hemoglobin A1c 9.4.  5. History of urinary tract infections.  6. Left eye surgery x4 secondary to a past injury with a nail gun.  7. Sleep apnea.   HISTORY OF PRESENT ILLNESS:  The patient is a 60 year old morbidly obese  white male who has diabetes.  He has not been on any medicines due to  missed appointments.  He presents to the hospital with 2 weeks of  worsening lower extremity edema and shortness of breath.  In the  emergency room, he was noted to be in atrial fibrillation with a rapid  ventricular response.  He was basically unaware of the atrial  arrhythmia.  He was subsequently placed on Cardizem drip and was  admitted for further evaluation.   Please see the history and physical for further presentation and  profile.   LABORATORY DATA:  His chest x-ray showed new cardiac enlargement with  possible edema.  He had a right pleural effusion.  His EKG showed atrial  fibrillation with a rapid ventricular response.  There were no ST or T-  wave changes to suggest ischemia.  His white count was 7.2, hemoglobin  15.  His creatinine was 0.9 with a glucose of 156.  His troponin was  negative with INR was 1.2, albumin was 3.0.  AST and ALT were not  elevated.   HOSPITAL COURSE:  The patient was admitted from the emergency room.  He  was placed on IV Cardizem drip and he was diuresed with  IV Lasix.  His  BNPs were really not elevated.  His presentation on admission was 193.  His point of care markers were negative.  Throughout the remainder of  his hospitalization, we worked on his volume overload.  He was diuresed  gently and had significant weight loss.  His weight on admission was 159  kg.  His rate was controlled with Cardizem, which was subsequently able  to be switched over to an oral regimen.  He was also placed on Coumadin.  We proceeded on with attempts at a TEE cardioversion on August 12, 2008;however, this was unsuccessful.  The patient did receive 59 shock  and had a brief run of sinus rhythm that was only sustained for about 15  seconds.  He subsequently converted back to atrial fibrillation.  He  received two further shocks without conversion.  The procedure was  subsequently aborted.  The patient will now continue with Coumadin  anticoagulation.  We will continue to diurese him as an outpatient and  we  will attempt repeat cardioversion with probable antiarrhythmic  therapy on board in approximately 3-4 weeks.  The patient was started on  CPAP during this admission and that will try to be carried on through at  discharge.  He has had wound care evaluate his lower legs, which are  slowly improving.  He has been up and ambulatory without problems and  today on August 13, 2008, he was felt to be a satisfactory candidate for  discharge.   Discharge weight is 140.7 kg.   Discharge chemistry shows sodium 138, potassium 3.9, chloride 102, CO2  29, BUN 9, creatinine 0.9, and glucose of 104.  His CBC is normal.  His  INR is 1.8.  His BNP is 126.   Discharge condition is stable.   Discharge diet is low-salt heart-healthy diabetic.   Activity is to be increased as tolerated.   DISCHARGE MEDICINES:  1. Glucophage 500 mg two times a day.  2. Januvia 100 mg a day.  3. Baby aspirin daily until he has been told to stop.  4. His eye drops he may continue as he was  taking before.  5. Potassium 20 mEq two times a day.  6. Coumadin 5 mg tablets and will have him take two tablets each      evening initially and then otherwise as directed.  7. Lasix 60 mg b.i.d. in the morning and afternoon.  8. Diltiazem 90 mg four times a day.   We will have his first protime and his Coumadin this coming Thursday at  our office.  He is to come between the hours of 8 a.m. and 4 p.m.  We  will alert Huey Romans that the patient will be discharged in order to arrange  home CPAP use.  We will plan on seeing him back in the office in  approximately 1 week, certainly sooner if any problems arise in the  interim.  We will also having touch base with Dr. Virgina Jock in the next week  or so as well.   Greater than 30 minutes spent for discharge.      [name], N.P.      Ludwig Lean. Doreatha Lew, M.D.  Electronically Signed    LC/MEDQ  D:  08/13/2008  T:  08/13/2008  Job:  [id number]   cc:   Precious Reel, MD

## 2010-07-21 NOTE — Op Note (Signed)
NAME:  Raymond Pratt, Raymond Pratt NO.:  [id number]   MEDICAL RECORD NO.:  [id number]          PATIENT TYPE:  INP   LOCATION:  4715                         FACILITY:  [facility]   PHYSICIAN:  Peter M. Martinique, M.D.  DATE OF BIRTH:  [birth year]   DATE OF PROCEDURE:  08/12/2008  DATE OF DISCHARGE:                               OPERATIVE REPORT   INDICATIONS FOR PROCEDURE:  The patient is a 60 year old white male with  history of congestive heart failure and atrial fibrillation.  Atrial  fibrillation rate has been controlled with Diltiazem.  He has been  anticoagulated.   PROCEDURE:  Transesophageal echocardiogram followed by elective  cardioversion.   TRANSESOPHAGEAL ECHOCARDIOGRAM NOTE:  The patient was sedated with 4 mg  of IV Versed and 50 mcg of IV fentanyl.  TEE probe was passed easily  down the esophagus.  The images demonstrated mild mitral, tricuspid, and  pulmonic insufficiency.  The aortic valve was normal.  The aorta was  normal.  The left ventricular function was normal.  There was no left  atrial or atrial appendage clot.   We proceeded at this point with elective cardioversion.  Per anesthesia,  the patient received IV Diprivan 100 mg.  He next received a  synchronized biphasic DC shock at 150 joules.  This did result in  conversion to normal sinus rhythm, but within 30 seconds he had  converted back into atrial fibrillation.  We then gave him a second and  third shock at 200 joules and the patient failed to convert with either  one of these defibrillations.  At this point, further attempts were  aborted.   IMPRESSION:  Unsuccessful elective cardioversion with inability to  maintain sinus rhythm.           ______________________________  Peter M. Martinique, M.D.     PMJ/MEDQ  D:  08/12/2008  T:  08/13/2008  Job:  [id number]   cc:   Ludwig Lean. Doreatha Lew, M.D.

## 2010-07-21 NOTE — H&P (Signed)
NAME:  Raymond Pratt, Raymond Pratt NO.:  [id number]   MEDICAL RECORD NO.:  [id number]          PATIENT TYPE:  EMS   LOCATION:  [facility]                         FACILITY:  [facility]   PHYSICIAN:  Nigel Bridgeman, MD     DATE OF BIRTH:  March 27, 1950   DATE OF ADMISSION:  08/04/2008  DATE OF DISCHARGE:                              HISTORY & PHYSICAL   CHIEF COMPLAINT:  Atrial fibrillation with rapid ventricular rate in the  setting of shortness of breath and swelling.   HISTORY OF PRESENT ILLNESS:  This is a 60 year old white male with a  history of diabetes mellitus presents with 2 weeks of worsening lower  extremity edema, abdominal edema, and shortness of breath.  The patient  has had worsening orthopnea as well and has slept in a recliner the last  two nights.  His legs are actually weeping fluid.  He denies chest pain  or palpitations or presyncope.  In the emergency department was found to  have atrial fibrillation with rapid ventricular response.  He has not  felt this at all and has no idea when he went into it.  He was placed on  a diltiazem drip and a cardiology consult was called.   PAST MEDICAL HISTORY:  1. Diabetes.  2. History of UTIs  3. Left eye surgery x4 secondary to injury with nail gun.  4. Tonsillectomy and adenoidectomy.   ALLERGIES:  NO KNOWN DRUG ALLERGIES.   MEDICATIONS:  None currently due to missed appointments.  He was  recently on Actos, Amaryl and Altace.   SOCIAL HISTORY:  Lives in [place].  He is on temporary  disability/workman's comp secondary to his eye injury.  He does not  smoke, drink or use drugs.  He is a Games developer.   FAMILY HISTORY:  His mother died of a brain tumor.  He is unsure what  his father died of.  His siblings are living without disease.   REVIEW OF SYSTEMS:  Complete review of systems done and found to be  otherwise negative except as stated in the HPI.   PHYSICAL EXAM:  VITAL SIGNS:  Temperature is 99.1 with a pulse of 131 on  admission with a respiratory rate of 22 and blood pressure of 121/65, O2  saturations are 97% on 2 liters.  With 30 mg of IV diltiazem his blood  pressure dropped 85/70, but has since come back up to 110/88.  His heart  rate is dropped down to 114.  GENERAL:  He is morbidly obese.  HEENT: Shows NCAT, MMM, oropharynx without erythema or exudates.  His  left eye has surgical changes.  NECK:  Supple without lymphadenopathy or bruits.  I cannot assess  thyromegaly or JVD due to neck plethora.  HEART:  Has an irregular irregular rhythm.  No murmurs, gallops or rubs.  Pulses 2+ and equal bilaterally without bruits.  LUNGS:  Mild crackles bilateral bases but for the most part clear.  SKIN:  Shows bilateral shin erythema especially around weeping sites.  He also has groin skin breakdown with probable fungus.  ABDOMEN:  Soft and nontender with normal  bowel sounds.  No rebound or  guarding.  He does have pitting edema in his lower abdomen.  EXTREMITIES:  Show no cyanosis or clubbing.  He does have 2-3+ pitting  edema bilaterally.  MUSCULOSKELETAL:  Shows no joint deformity, effusions or spine or CVA  tenderness.  NEUROLOGICALLY:  He is alert and oriented x3 with cranial nerves II-XII  grossly intact.  Strength is 5/5 all extremities and axial groups with  normal sensation throughout.   LABORATORY:  Radiology review:  His chest x-ray shows new myocardial  enlargement with possible edema.  He has right pleural effusion.  EKG  shows a rate of 134 in atrial fibrillation but no ST or T-wave changes  indicating ischemia.  His labs show white count of 7.2 with a hemoglobin  of 15.5.  His creatinine is 0.9 with a glucose of 156.  Troponin is  negative at this time and INR is 1.2.  Total protein is 6.1 with albumin  of 3.0.  His total bilirubin is 1.6 and his AST and ALT are not  elevated.   ASSESSMENT/PLAN:  This is a 60 year old white male with no significant  heart history who presents in atrial  fibrillation with rapid ventricular  rate and fluid retention likely secondary to congestive heart failure.  1. Atrial fibrillation with rapid ventricular response:  Control rate      with diltiazem drip, heparinize for thrombus prevention.  Check      TSH.  Check echo and if left atrial enlargement absent, consider      TEE/DC cardioversion in the a.m..  2. Fluid retention:  Likely congestive heart failure.  Etiologies      include ischemic cardiomyopathy versus tachycardia induced      cardiomyopathy.  Check BNP and an echocardiogram.  Diurese with      Lasix b.i.d. to t.i.d. depending on response in blood pressure.  3. Coronary artery disease risk:  The patient is very high risk for      coronary artery disease with diabetes.  I suspect he has high      cholesterol as well and lipids will be checked in the morning.  I      am unsure of his blood pressure at home although it was elevated at      some point as evidenced by his prior placement on Altace.  He will      likely need an ischemic evaluation in the future, especially if he      has congestive heart failure, but this may be warranted in the      setting of atrial fibrillation as well.  4. Diabetes mellitus:  I will place him on insulin sliding scale for      now.  Hemoglobin A1c will be checked.  5. Groin breakdown:  Nystatin powder b.i.d.  6. Pt complains of urinary issues: will check UA for occult UTI.      Nigel Bridgeman, MD  Electronically Signed     ACJ/MEDQ  D:  08/04/2008  T:  08/04/2008  Job:  [id number]

## 2010-07-21 NOTE — H&P (Signed)
NAME:  Raymond Pratt, Raymond Pratt NO.:  [id number]   MEDICAL RECORD NO.:  [id number]          PATIENT TYPE:  INP   LOCATION:  2005                         FACILITY:  [facility]   PHYSICIAN:  Ludwig Lean. Doreatha Lew, M.D.DATE OF BIRTH:  1950-09-28   DATE OF ADMISSION:  09/16/2008  DATE OF DISCHARGE:                              HISTORY & PHYSICAL   CHIEF COMPLAINT:  None.   HISTORY OF PRESENT ILLNESS:  Raymond Pratt is a 60 year old morbidly  obese white male who presents for initiation of antiarrhythmic therapy.  He presented with volume overload towards the latter part of May 2010.  At that time, he was noted to be in atrial fibrillation.  He had an  unsuccessful attempt at cardioversion.  He has been maintained on  Coumadin anticoagulation.  He now presents for initiation of  antiarrhythmic therapy with plans for repeat cardioversion in order to  restore sinus rhythm.  Clinically, he has done well.  He has diuresed  nicely.  He is no longer short of breath.   PAST MEDICAL HISTORY:  1. Atrial fibrillation diagnosed in May 2010.  He did have a failed      attempt at TEE cardioversion.  2. Coumadin anticoagulation.  3. History of volume overload/cor pulmonale.  4. Diabetes.  5. History of UTI.  6. Left eye surgery x4 due to a past injury with a nail gun.  7. History of sleep apnea.  8. Cardiac enlargement.   ALLERGIES:  None.   CURRENT MEDICATIONS:  1. Lasix 40 mg taking one and a half tablets b.i.d.  2. Klor-Con 20 mEq daily.  3. Januvia 100 mg daily.  4. Metformin 500 mg 2 times a day.  5. Coumadin 7.5 alternating with 5 mg tablets.  6. Tylenol 500 mg on an as-needed basis.  7. Diltiazem 90 mg 4 times a day.  8. Lanoxin 0.125 mg daily.  9. Brimonidine tartrate  1 drop in the left eye.  10.Prednisolone acetate 1 drop in the left eye.  11.Timolol maleate 1 drop also in the left eye.   SOCIAL HISTORY:  He lives alone.  He is currently on workmen's comp due  to his  previous eye injury.  He has no smoking history and does not have  any alcohol use.   FAMILY HISTORY:  His mother died of a brain tumor.  He is unsure of what  his father died of.  He does have siblings that are living without  significant disease.   REVIEW OF SYSTEMS:  He has had no recent fever, flu, or chills.  He  denies headaches, being lightheaded, or dizzy.  He has not had any chest  pain.  He is no longer short of breath.  He has no complaints of  palpitations.  No complaints of abdominal upset, constipation, or  diarrhea.  Lower extremity edema has basically resolved.  All other  review of systems are negative.   PHYSICAL EXAMINATION:  GENERAL:  He is a pleasant, morbidly obese white  male who is in no acute distress.  He is alert and conversive.  SKIN:  Warm and dry.  Color is unremarkable.  VITAL SIGNS:  Blood pressure 118/78 sitting and 116/76 standing, his  weight is 259 pounds, heart rate is 100 and irregular.  HEENT:  Normocephalic, atraumatic.  Pupils are reactive.  There is a  left eye deformity noted.  Sclerae is nonicteric.  NECK:  Supple.  There is no adenopathy.  No JVD.  LUNGS:  Clear.  CARDIAC:  An irregularly irregular rhythm.  His heart tones are distant.  ABDOMEN:  Morbidly obese, yet soft.  He does have positive bowel sounds.  EXTREMITIES:  Full, but really no significant edema.  MUSCULOSKELETAL:  Strength to be equal and symmetric.  Gait and range of  motion are intact.  NEUROLOGIC:  No gross focal deficits.   Pertinent labs are pending.   OVERALL IMPRESSION:  1. Atrial fibrillation.  2. Morbid obesity.  3. Diabetes.  4. Cor pulmonale with a history of volume overload.   PLAN:  We will proceed on with admission to the hospital on Monday, September 16, 2008.  We will make plans to start him on sotalol 80 mg 2 times a  day.  We will attempt repeat cardioversion during this admission as  well.  The further treatment plan to follow per Dr. Susa Simmonds   discretion.      [name], N.P.      Ludwig Lean. Doreatha Lew, M.D.  Electronically Signed    LC/MEDQ  D:  09/11/2008  T:  09/11/2008  Job:  [id number]   cc:   Precious Reel, MD
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
## 2011-07-28 ENCOUNTER — Encounter: Payer: Self-pay | Admitting: *Deleted

## 2012-02-28 ENCOUNTER — Inpatient Hospital Stay (HOSPITAL_COMMUNITY): Payer: Self-pay

## 2012-02-28 ENCOUNTER — Encounter (HOSPITAL_COMMUNITY): Payer: Self-pay | Admitting: General Practice

## 2012-02-28 ENCOUNTER — Inpatient Hospital Stay (HOSPITAL_COMMUNITY)
Admission: AD | Admit: 2012-02-28 | Discharge: 2012-03-08 | DRG: 287 | Disposition: A | Discharge status: Home / Self Care | Payer: 59 | Source: Ambulatory Visit | Attending: Cardiology | Admitting: Cardiology

## 2012-02-28 DIAGNOSIS — I5043 Acute on chronic combined systolic (congestive) and diastolic (congestive) heart failure: Principal | ICD-10-CM

## 2012-02-28 DIAGNOSIS — I2510 Atherosclerotic heart disease of native coronary artery without angina pectoris: Secondary | ICD-10-CM | POA: Diagnosis present

## 2012-02-28 DIAGNOSIS — E1169 Type 2 diabetes mellitus with other specified complication: Secondary | ICD-10-CM | POA: Diagnosis present

## 2012-02-28 DIAGNOSIS — Y849 Medical procedure, unspecified as the cause of abnormal reaction of the patient, or of later complication, without mention of misadventure at the time of the procedure: Secondary | ICD-10-CM | POA: Diagnosis not present

## 2012-02-28 DIAGNOSIS — G4733 Obstructive sleep apnea (adult) (pediatric): Secondary | ICD-10-CM

## 2012-02-28 DIAGNOSIS — T85698A Other mechanical complication of other specified internal prosthetic devices, implants and grafts, initial encounter: Secondary | ICD-10-CM | POA: Diagnosis not present

## 2012-02-28 DIAGNOSIS — I10 Essential (primary) hypertension: Secondary | ICD-10-CM | POA: Diagnosis present

## 2012-02-28 DIAGNOSIS — Z7901 Long term (current) use of anticoagulants: Secondary | ICD-10-CM

## 2012-02-28 DIAGNOSIS — Z9989 Dependence on other enabling machines and devices: Secondary | ICD-10-CM

## 2012-02-28 DIAGNOSIS — N390 Urinary tract infection, site not specified: Secondary | ICD-10-CM

## 2012-02-28 DIAGNOSIS — I517 Cardiomegaly: Secondary | ICD-10-CM | POA: Diagnosis present

## 2012-02-28 DIAGNOSIS — IMO0002 Reserved for concepts with insufficient information to code with codable children: Secondary | ICD-10-CM | POA: Diagnosis present

## 2012-02-28 DIAGNOSIS — T07XXXA Unspecified multiple injuries, initial encounter: Secondary | ICD-10-CM | POA: Diagnosis present

## 2012-02-28 DIAGNOSIS — I509 Heart failure, unspecified: Secondary | ICD-10-CM | POA: Diagnosis present

## 2012-02-28 DIAGNOSIS — Z6839 Body mass index (BMI) 39.0-39.9, adult: Secondary | ICD-10-CM

## 2012-02-28 DIAGNOSIS — R0789 Other chest pain: Secondary | ICD-10-CM

## 2012-02-28 DIAGNOSIS — Y9210 Unspecified residential institution as the place of occurrence of the external cause: Secondary | ICD-10-CM | POA: Diagnosis not present

## 2012-02-28 DIAGNOSIS — E785 Hyperlipidemia, unspecified: Secondary | ICD-10-CM | POA: Diagnosis present

## 2012-02-28 DIAGNOSIS — Z7982 Long term (current) use of aspirin: Secondary | ICD-10-CM

## 2012-02-28 DIAGNOSIS — I4891 Unspecified atrial fibrillation: Secondary | ICD-10-CM

## 2012-02-28 DIAGNOSIS — E871 Hypo-osmolality and hyponatremia: Secondary | ICD-10-CM

## 2012-02-28 DIAGNOSIS — R188 Other ascites: Secondary | ICD-10-CM

## 2012-02-28 DIAGNOSIS — L03119 Cellulitis of unspecified part of limb: Secondary | ICD-10-CM

## 2012-02-28 DIAGNOSIS — J9819 Other pulmonary collapse: Secondary | ICD-10-CM | POA: Diagnosis present

## 2012-02-28 DIAGNOSIS — E1165 Type 2 diabetes mellitus with hyperglycemia: Secondary | ICD-10-CM | POA: Diagnosis present

## 2012-02-28 DIAGNOSIS — I279 Pulmonary heart disease, unspecified: Secondary | ICD-10-CM | POA: Diagnosis present

## 2012-02-28 DIAGNOSIS — I080 Rheumatic disorders of both mitral and aortic valves: Secondary | ICD-10-CM | POA: Diagnosis present

## 2012-02-28 DIAGNOSIS — E662 Morbid (severe) obesity with alveolar hypoventilation: Secondary | ICD-10-CM | POA: Diagnosis present

## 2012-02-28 HISTORY — DX: Obstructive sleep apnea (adult) (pediatric): G47.33

## 2012-02-28 HISTORY — DX: Unspecified osteoarthritis, unspecified site: M19.90

## 2012-02-28 HISTORY — PX: PERIPHERALLY INSERTED CENTRAL CATHETER INSERTION: SHX2221

## 2012-02-28 HISTORY — DX: Adverse effect of unspecified anesthetic, initial encounter: T41.45XA

## 2012-02-28 HISTORY — DX: Dependence on other enabling machines and devices: Z99.89

## 2012-02-28 LAB — COMPREHENSIVE METABOLIC PANEL
ALT: 32 U/L (ref 0–53)
AST: 36 U/L (ref 0–37)
Albumin: 3.3 g/dL — ABNORMAL LOW (ref 3.5–5.2)
Alkaline Phosphatase: 98 U/L (ref 39–117)
BUN: 19 mg/dL (ref 6–23)
CO2: 30 mEq/L (ref 19–32)
Calcium: 9.1 mg/dL (ref 8.4–10.5)
Chloride: 99 mEq/L (ref 96–112)
Creatinine, Ser: 0.95 mg/dL (ref 0.50–1.35)
GFR calc Af Amer: >90 mL/min (ref >90)
GFR calc non Af Amer: 89 mL/min — ABNORMAL LOW (ref >90)
Glucose, Bld: 145 mg/dL — ABNORMAL HIGH (ref 70–99)
Potassium: 3.6 mEq/L (ref 3.5–5.1)
Sodium: 138 mEq/L (ref 135–145)
Total Bilirubin: 1 mg/dL (ref 0.3–1.2)
Total Protein: 7.5 g/dL (ref 6.0–8.3)

## 2012-02-28 LAB — PROTIME-INR
INR: 2.41 — ABNORMAL HIGH (ref 0.00–1.49)
Prothrombin Time: 25.1 seconds — ABNORMAL HIGH (ref 11.6–15.2)

## 2012-02-28 LAB — CBC
HCT: 43.9 % (ref 39.0–52.0)
Hemoglobin: 14 g/dL (ref 13.0–17.0)
MCH: 31.1 pg (ref 26.0–34.0)
MCHC: 31.9 g/dL (ref 30.0–36.0)
MCV: 97.6 fL (ref 78.0–100.0)
Platelets: 265 10*3/uL (ref 150–400)
RBC: 4.5 MIL/uL (ref 4.22–5.81)
RDW: 14.2 % (ref 11.5–15.5)
WBC: 7.4 10*3/uL (ref 4.0–10.5)

## 2012-02-28 LAB — TROPONIN I: Troponin I: <0.3 ng/mL (ref <0.30)

## 2012-02-28 LAB — TSH: TSH: 2.919 u[IU]/mL (ref 0.350–4.500)

## 2012-02-28 LAB — PRO B NATRIURETIC PEPTIDE: Pro B Natriuretic peptide (BNP): 1405 pg/mL — ABNORMAL HIGH (ref 0–125)

## 2012-02-28 LAB — GLUCOSE, CAPILLARY: Glucose-Capillary: 137 mg/dL — ABNORMAL HIGH (ref 70–99)

## 2012-02-28 MED ORDER — ACETAMINOPHEN 325 MG PO TABS
650.0000 mg | ORAL_TABLET | Freq: Four times a day (QID) | ORAL | Status: DC | PRN
  Administered 2012-03-02 – 2012-03-05 (×5): 650 mg via ORAL
  Filled 2012-02-28 (×5): qty 2

## 2012-02-28 MED ORDER — SODIUM CHLORIDE 0.9 % IJ SOLN
3.0000 mL | Freq: Two times a day (BID) | INTRAMUSCULAR | Status: DC
  Administered 2012-02-28 – 2012-03-06 (×8): 3 mL via INTRAVENOUS

## 2012-02-28 MED ORDER — FUROSEMIDE 10 MG/ML IJ SOLN
80.0000 mg | Freq: Three times a day (TID) | INTRAMUSCULAR | Status: DC
  Administered 2012-02-28 – 2012-03-02 (×8): 80 mg via INTRAVENOUS
  Filled 2012-02-28 (×11): qty 8

## 2012-02-28 MED ORDER — FUROSEMIDE 10 MG/ML IJ SOLN: 40.0000 mg | Freq: Two times a day (BID) | INTRAMUSCULAR | Status: DC

## 2012-02-28 MED ORDER — ASPIRIN 81 MG PO TABS: 81.0000 mg | ORAL_TABLET | Freq: Every day | ORAL | Status: DC

## 2012-02-28 MED ORDER — SODIUM CHLORIDE 0.9 % IJ SOLN: 3.0000 mL | INTRAMUSCULAR | Status: DC | PRN

## 2012-02-28 MED ORDER — METOLAZONE 2.5 MG PO TABS
2.5000 mg | ORAL_TABLET | Freq: Every day | ORAL | Status: DC
  Administered 2012-02-28 – 2012-03-01 (×3): 2.5 mg via ORAL
  Filled 2012-02-28 (×4): qty 1

## 2012-02-28 MED ORDER — AMIODARONE HCL IN DEXTROSE 360-4.14 MG/200ML-% IV SOLN
60.0000 mg/h | INTRAVENOUS | Status: AC
  Administered 2012-02-28 (×2): 60 mg/h via INTRAVENOUS
  Filled 2012-02-28 (×2): qty 200

## 2012-02-28 MED ORDER — DIGOXIN 250 MCG PO TABS
0.2500 mg | ORAL_TABLET | Freq: Every day | ORAL | Status: DC
  Administered 2012-02-28 – 2012-03-08 (×10): 0.25 mg via ORAL
  Filled 2012-02-28 (×10): qty 1

## 2012-02-28 MED ORDER — SODIUM CHLORIDE 0.9 % IV SOLN: 250.0000 mL | INTRAVENOUS | Status: DC | PRN

## 2012-02-28 MED ORDER — SODIUM CHLORIDE 0.9 % IJ SOLN: 10.0000 mL | INTRAMUSCULAR | Status: DC | PRN

## 2012-02-28 MED ORDER — ENALAPRIL MALEATE 2.5 MG PO TABS
2.5000 mg | ORAL_TABLET | Freq: Two times a day (BID) | ORAL | Status: DC
  Administered 2012-02-28 – 2012-03-08 (×18): 2.5 mg via ORAL
  Filled 2012-02-28 (×20): qty 1

## 2012-02-28 MED ORDER — FUROSEMIDE 10 MG/ML IJ SOLN
80.0000 mg | Freq: Two times a day (BID) | INTRAMUSCULAR | Status: DC
  Administered 2012-02-28: 80 mg via INTRAVENOUS
  Filled 2012-02-28 (×2): qty 8

## 2012-02-28 MED ORDER — AMIODARONE LOAD VIA INFUSION
150.0000 mg | Freq: Once | INTRAVENOUS | Status: AC
  Administered 2012-02-28: 150 mg via INTRAVENOUS
  Filled 2012-02-28: qty 83.34

## 2012-02-28 MED ORDER — SIMVASTATIN 20 MG PO TABS
20.0000 mg | ORAL_TABLET | Freq: Every day | ORAL | Status: DC
  Administered 2012-02-28 – 2012-03-07 (×9): 20 mg via ORAL
  Filled 2012-02-28 (×11): qty 1

## 2012-02-28 MED ORDER — INSULIN ASPART 100 UNIT/ML ~~LOC~~ SOLN
0.0000 [IU] | Freq: Three times a day (TID) | SUBCUTANEOUS | Status: DC
  Administered 2012-02-28 – 2012-02-29 (×2): 2 [IU] via SUBCUTANEOUS
  Administered 2012-02-29: 3 [IU] via SUBCUTANEOUS
  Administered 2012-03-01: 2 [IU] via SUBCUTANEOUS
  Administered 2012-03-01 – 2012-03-02 (×2): 3 [IU] via SUBCUTANEOUS
  Administered 2012-03-03 – 2012-03-05 (×4): 2 [IU] via SUBCUTANEOUS
  Administered 2012-03-05: 3 [IU] via SUBCUTANEOUS
  Administered 2012-03-06 – 2012-03-08 (×3): 2 [IU] via SUBCUTANEOUS

## 2012-02-28 MED ORDER — ASPIRIN EC 81 MG PO TBEC
81.0000 mg | DELAYED_RELEASE_TABLET | Freq: Every day | ORAL | Status: DC
  Administered 2012-02-28 – 2012-03-08 (×10): 81 mg via ORAL
  Filled 2012-02-28 (×10): qty 1

## 2012-02-28 MED ORDER — ACETAMINOPHEN 650 MG RE SUPP: 650.0000 mg | Freq: Four times a day (QID) | RECTAL | Status: DC | PRN

## 2012-02-28 MED ORDER — SODIUM CHLORIDE 0.9 % IJ SOLN
3.0000 mL | Freq: Two times a day (BID) | INTRAMUSCULAR | Status: DC
  Administered 2012-02-28 – 2012-03-05 (×3): 3 mL via INTRAVENOUS

## 2012-02-28 MED ORDER — AMIODARONE HCL IN DEXTROSE 360-4.14 MG/200ML-% IV SOLN
30.0000 mg/h | INTRAVENOUS | Status: DC
  Administered 2012-02-29 – 2012-03-02 (×6): 30 mg/h via INTRAVENOUS
  Filled 2012-02-28 (×12): qty 200

## 2012-02-28 MED ORDER — DILTIAZEM HCL ER COATED BEADS 120 MG PO CP24
120.0000 mg | ORAL_CAPSULE | Freq: Every day | ORAL | Status: DC
  Filled 2012-02-28: qty 1

## 2012-02-28 MED ORDER — POTASSIUM CHLORIDE CRYS ER 20 MEQ PO TBCR
40.0000 meq | EXTENDED_RELEASE_TABLET | Freq: Two times a day (BID) | ORAL | Status: DC
  Administered 2012-02-28: 40 meq via ORAL
  Filled 2012-02-28 (×3): qty 2

## 2012-02-28 MED ORDER — TIMOLOL MALEATE 0.25 % OP SOLN
1.0000 [drp] | Freq: Every day | OPHTHALMIC | Status: DC
  Filled 2012-02-28: qty 5
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
## 2012-02-28 NOTE — Plan of Care (Signed)
Problem: Food- and Nutrition-Related Knowledge Deficit (NB-1.1) Goal: Nutrition education Formal process to instruct or train a patient/client in a skill or to impart knowledge to help patients/clients voluntarily manage or modify food choices and eating behavior to maintain or improve health.  Outcome: Completed/Met Date Met:  02/28/12   RD consulted for nutrition education regarding weight loss.  Per pt he did weigh 365 lb over a year ago. He had gotten down to 248 lb with exercise and healthy diet. Pt is now up to 306 lb per pt a lot is fluid (pt admitted with fluid overload). Pt feels that diet has not changed and that fluid gain has been due to diabetes medication change. Pt admits to having more weight to lose and want to continue to lose weight.   Body mass index is 46.65 kg/(m^2). Pt meets criteria for extreme obesity class III based on current BMI.  RD provided "Weight Loss Tips" handout from the Academy of [employer]. Emphasized the importance of serving sizes and provided examples of correct portions of common foods. Discussed importance of controlled and consistent intake throughout the day. Provided examples of ways to balance meals/snacks and encouraged intake of high-fiber, whole grain complex carbohydrates. Emphasized the importance of hydration with calorie-free beverages and limiting sugar-sweetened beverages. Encouraged pt to discuss physical activity options with physician. Teach back method used.  Expect fair compliance.  Current diet order is CHO Modified Medium , patient is consuming approximately 100% of meals at this time. Labs and medications reviewed. No further nutrition interventions warranted at this time. RD contact information provided. If additional nutrition issues arise, please re-consult RD.  [place], [place], [place] Pager [phone] After Hours Pager
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
## 2012-02-28 NOTE — Progress Notes (Signed)
*  PRELIMINARY RESULTS* Echocardiogram 2D Echocardiogram has been performed.  Raymond Pratt 02/28/2012, 4:49 PM

## 2012-02-29 ENCOUNTER — Inpatient Hospital Stay (HOSPITAL_COMMUNITY): Payer: Self-pay

## 2012-02-29 LAB — GLUCOSE, CAPILLARY
Glucose-Capillary: 161 mg/dL — ABNORMAL HIGH (ref 70–99)
Glucose-Capillary: 123 mg/dL — ABNORMAL HIGH (ref 70–99)

## 2012-02-29 LAB — BASIC METABOLIC PANEL
Chloride: 98 mEq/L (ref 96–112)
GFR calc Af Amer: >90 mL/min (ref >90)
Potassium: 3.3 mEq/L — ABNORMAL LOW (ref 3.5–5.1)

## 2012-02-29 LAB — CBC
HCT: 45.6 % (ref 39.0–52.0)
Platelets: 291 10*3/uL (ref 150–400)
RDW: 14.1 % (ref 11.5–15.5)
WBC: 8.6 10*3/uL (ref 4.0–10.5)

## 2012-02-29 LAB — PROTIME-INR: INR: 2.08 — ABNORMAL HIGH (ref 0.00–1.49)

## 2012-02-29 MED ORDER — AMIODARONE IV BOLUS ONLY 150 MG/100ML
150.0000 mg | Freq: Once | INTRAVENOUS | Status: AC
  Administered 2012-02-29: 150 mg via INTRAVENOUS
  Filled 2012-02-29 (×2): qty 100

## 2012-02-29 MED ORDER — HEPARIN (PORCINE) IN NACL 100-0.45 UNIT/ML-% IJ SOLN
1600.0000 [IU]/h | INTRAMUSCULAR | Status: DC
  Administered 2012-02-29: 1200 [IU]/h via INTRAVENOUS
  Filled 2012-02-29 (×3): qty 250

## 2012-02-29 MED ORDER — SPIRONOLACTONE 12.5 MG HALF TABLET
12.5000 mg | ORAL_TABLET | Freq: Every day | ORAL | Status: DC
  Administered 2012-02-29 – 2012-03-08 (×9): 12.5 mg via ORAL
  Filled 2012-02-29 (×10): qty 1

## 2012-02-29 MED ORDER — POTASSIUM CHLORIDE CRYS ER 20 MEQ PO TBCR
40.0000 meq | EXTENDED_RELEASE_TABLET | Freq: Three times a day (TID) | ORAL | Status: DC
  Administered 2012-02-29 – 2012-03-02 (×9): 40 meq via ORAL
  Filled 2012-02-29 (×10): qty 2

## 2012-02-29 NOTE — Progress Notes (Signed)
Subjective: S/P 3rd dose of Lasix and is O>>I Almost 3# weight loss. PICC is coiled and needs to be done by IR Discussed. Breathing OK sitting upright. HR = AFib and 110-115. Looks better than yesterday  Objective: Vital signs in last 24 hours: Temp:  [97.5 F (36.4 C)-98.2 F (36.8 C)] 98.2 F (36.8 C) (12/24 0532) Pulse Rate:  [80-134] 111  (12/24 0532) Resp:  [20-21] 20  (12/24 0532) BP: (116-131)/(57-103) 131/76 mmHg (12/24 0532) SpO2:  [95 %-99 %] 95 % (12/24 0532) Weight:  [137.893 kg (304 lb)-139.164 kg (306 lb 12.8 oz)] 137.893 kg (304 lb) (12/24 0532) Weight change:  Last BM Date: 02/28/12  CBG (last 3)   Basename 02/29/12 0547 02/28/12 2211 02/28/12 1714  GLUCAP 136* 135* 137*    Intake/Output from previous day:  Intake/Output Summary (Last 24 hours) at 02/29/12 0828 Last data filed at 02/29/12 0807  Gross per 24 hour  Intake 1461.25 ml  Output   3825 ml  Net -2363.75 ml   12/23 0701 - 12/24 0700 In: 1101.3 [P.O.:802; I.V.:299.3] Out: 3675 [Urine:3675]   Physical Exam  General appearance: Alert and oriented.  Sitting in chair with leg elevated Head: Normocephalic, without obvious abnormality, atraumatic  Eyes: conjunctivae/corneas clear. PERRL, EOM's intact.  Nose: Nares normal. Septum midline. Mucosa normal. No drainage or sinus tenderness.  Throat: lips, mucosa, and tongue normal; teeth and gums normal  Neck: + JVD  Resp: Distant c Rales  Cardio: Irreg Irreg Rapid  GI: Ab distended and tense with fluid. Obese  Extremities:2-3+ Edema c excoriations/weaping  Looks less like cellulitis this am   Lab Results:  [facility] 02/29/12 0425 02/28/12 1614  NA 137 138  K 3.3* 3.6  CL 98 99  CO2 27 30  GLUCOSE 131* 145*  BUN 21 19  CREATININE 1.02 0.95  CALCIUM 8.9 9.1  MG -- 2.2  PHOS -- 3.0     Basename 02/28/12 1614  AST 36  ALT 32  ALKPHOS 98  BILITOT 1.0  PROT 7.5  ALBUMIN 3.3*     Basename 02/29/12 0425 02/28/12 1614  WBC 8.6 7.4   NEUTROABS -- --  HGB 14.8 14.0  HCT 45.6 43.9  MCV 97.0 97.6  PLT 291 265    Lab Results  Component Value Date   INR 2.41* 02/28/2012   INR 2.88* 01/11/2010   INR 5.84 CRITICAL RESULT CALLED TO, READ BACK BY AND VERIFIED WITH: BOLLER T,RN [id number] 11.05.11 BUCKSONG* 01/10/2010     Basename 02/28/12 1534  CKTOTAL --  CKMB --  CKMBINDEX --  TROPONINI <0.30     Basename 02/28/12 1614  TSH 2.919  T4TOTAL --  T3FREE --  THYROIDAB --    No results found for this basename: VITAMINB12:2,FOLATE:2,FERRITIN:2,TIBC:2,IRON:2,RETICCTPCT:2 in the last 72 hours  Micro Results: No results found for this or any previous visit (from the past 240 hour(s)).   Studies/Results: US Abdomen Limited  02/28/2012  *RADIOLOGY REPORT*  Clinical Data:  Abdominal bloating, evaluate for ascites for possible paracentesis  LIMITED ABDOMINAL ULTRASOUND - RIGHT UPPER QUADRANT  Comparison:  None.  Findings:  No ascites visualized.  IMPRESSION: No ascites visualized.   Original Report Authenticated By: Conchita Paris, M.D.    Dg Chest Port 1 View  02/28/2012  *RADIOLOGY REPORT*  Clinical Data: Central line placement.  PORTABLE CHEST - 1 VIEW  Comparison: Chest radiograph performed earlier today at 03:51 p.m.  Findings: The patient's right PICC appears to be coiled in the right axillary region; this  may be progressing distally along a right axillary vessel, and should be significantly retracted or removed.  Lung expansion is mildly decreased.  Mild vascular congestion is noted.  Mild scattered bilateral atelectasis is seen.  No definite pleural effusion or pneumothorax is identified.  The cardiomediastinal silhouette remains enlarged.  No acute osseous abnormalities are seen.  IMPRESSION:  1.  Right PICC appears to be coiled in the right axillary region; this may be progressing distally along the right axillary vessels, and should be significantly retracted or removed. 2.  Lungs hypoexpanded; mild scattered bilateral  atelectasis seen. 3.  Mild vascular congestion and cardiomegaly again noted.  These results were called by telephone on 02/28/2012 at 11:33 p.m. to [facility], who verbally acknowledged these results.   Original Report Authenticated By: Santa Lighter, M.D.    Portable Chest 1 View  02/28/2012  *RADIOLOGY REPORT*  Clinical Data: Short of breath  PORTABLE CHEST - 1 VIEW  Comparison: 01/08/2010  Findings: Severe cardiomegaly.  Vascular congestion.  No Kerley B lines to suggest pulmonary edema.  No pneumothorax.  No definite pleural effusions. Subsegmental atelectasis at both lung bases.  IMPRESSION: Cardiomegaly and vascular congestion. Bibasilar atelectasis.   Original Report Authenticated By: Marybelle Killings, M.D.      Medications: Scheduled:   . aspirin EC  81 mg Oral Daily  . digoxin  0.25 mg Oral Daily  . enalapril  2.5 mg Oral BID  . furosemide  80 mg Intravenous Q8H  . insulin aspart  0-15 Units Subcutaneous TID WC  . metolazone  2.5 mg Oral Daily  . potassium chloride  40 mEq Oral BID  . simvastatin  20 mg Oral q1800  . sodium chloride  3 mL Intravenous Q12H  . sodium chloride  3 mL Intravenous Q12H  . timolol  1 drop Both Eyes Daily   Continuous:   . amiodarone (NEXTERONE PREMIX) 360 mg/200 mL dextrose 30 mg/hr (02/29/12 0225)     Assessment/Plan: Active Problems:  DIABETES MELLITUS, UNCONTROLLED  ATRIAL FIBRILLATION WITH RAPID VENTRICULAR RESPONSE  Acute on chronic combined systolic and diastolic heart failure  Chest pressure  CHF R and L CHF/Anasarca/Cor Pulmonale/Obesity Hypoventilation - Now EF 20-25%.  Continue aggressive Diuresis.  Elevation of the legs, use of compression stockings, sodium restiction, and meds. May need step down.  Dr B says he will take pt onto his service this am with me managing the DM - I appreciate that.  May need Cath.  May need ultrafiltration.  Dr B wants him off Dilt with the low EF.  K needs replacement Afterload reduction started.   Attempt at PICC was curled and may need to get via IR - cards to order.  HTN - BP is usually excellent and low - He may not tolerate aggressive adjustment of meds   Afib with RVR -Better HR on IV Amio (still tachy but better) and may do IV Dig.  Coumadin with Goal INR 2-3, but current plan is to hold coumadin and use IV Hep gtt  H/O Failed DCCV   MORBID OBESITY  - Needs wt loss.  HYPERLIPIDEMIA on Pravastatin Sodium 40 Mg Tabs (Pravastatin sodium) ... With last LDL: 103 (08/24/2011)  OBSTRUCTIVE SLEEP APNEA - CPAP HS ordered   Legs irritated red and excoriated but no obvious cellulitis. WBC fine. Monitor  DVT Proph with Coumadin/Heparin - INR 2.41 now  I consulted SW to see what he may qualify for - as he is uninsured. Maybe he can sign up for Obamacare.  DIABETES MELLITUS - On Januvia/Onglyza outpatient and will just do ISS Last Hgb A1C: 7.2 up from 5.7  He is off Glucophage.  CBGs are fine -  Basename 02/29/12 0547 02/28/12 2211 02/28/12 1714  GLUCAP 136* 135* 137*     LOS: 1 day   [place] M 02/29/2012, 8:28 AM

## 2012-02-29 NOTE — Progress Notes (Signed)
Advanced Heart Failure Rounding Note   Subjective:    [name] is a 61 y.o. gentlemen with history of morbid obesity, chronic atrial fibrillation on coumadin (diagnosed in 2010 with failed DCCV), diastolic heart failure (per echo 2010), HTN and sleep apnea with CPAP. Also has history of DM (dx 10 years ago) and left eye surgery due to trauma from nail gun accident.  He has been admitted by Dr. Virgina Pratt for progressive edema that has "spread to whole body." Pertinent labs include proBNP 1405, Cr 0.95, Albumin 3.3, INR 2.41. He is in rapid AF with HRs in 110-150 range. CXR with cardiomegaly and vascular congestion and bibasilar atelectasis. No ascites noted on abdominal u/s Wt 306 pounds on admit. He has been placed on IV lasix 80 mg BID. Repeat echo showed LVEF fallen 20-25% (nml 2010), diffuse hypokinesis. Trivial AI, mild MR, LA mildly dilated, RV mod dilated with mod reduced systolic function, PAPP 46 mmHg.   Net -2.5L overnight.  Feels ok.  +orthopnea, lower extremity edema and abdominal distention.   Amiodarone gtt started yesterday for afib with RVR, remains 100-120.      Objective:   Weight Range:  Vital Signs:   Temp:  [97.5 F (36.4 C)-98.2 F (36.8 C)] 98.2 F (36.8 C) (12/24 0532) Pulse Rate:  [80-134] 111  (12/24 0532) Resp:  [20-21] 20  (12/24 0532) BP: (116-131)/(57-103) 131/76 mmHg (12/24 0532) SpO2:  [95 %-99 %] 95 % (12/24 0532) Weight:  [304 lb (137.893 kg)-306 lb 12.8 oz (139.164 kg)] 304 lb (137.893 kg) (12/24 0532) Last BM Date: 02/28/12  Weight change: Filed Weights   02/28/12 1445 02/29/12 0532  Weight: 306 lb 12.8 oz (139.164 kg) 304 lb (137.893 kg)    Intake/Output:   Intake/Output Summary (Last 24 hours) at 02/29/12 0751 Last data filed at 02/29/12 0606  Gross per 24 hour  Intake 1101.25 ml  Output   3675 ml  Net -2573.75 ml     Physical Exam: General: Obese, chronically-ill appearing. No resp difficulty  HEENT: normal  Neck: supple. Very  thick. Unable to assess JVP . Carotids 2+ bilat; no bruits. No lymphadenopathy or thryomegaly appreciated.  Cor: PMI nonpalpable. Irregularly irreg distant heart sounds  Lungs: clear  Abdomen: nontender, obese, + distention. Good bowel sounds.  Extremities: no cyanosis, clubbing, rash, 3-4+ edema up to thighs and into scrotum with weeping areas  Neuro: alert & orientedx3, cranial nerves grossly intact. moves all 4 extremities w/o difficulty. Affect pleasant   Telemetry: afib   Labs: Basic Metabolic Panel:  Lab [id number] 0425 02/28/12 1614  NA 137 138  K 3.3* 3.6  CL 98 99  CO2 27 30  GLUCOSE 131* 145*  BUN 21 19  CREATININE 1.02 0.95  CALCIUM 8.9 9.1  MG -- 2.2  PHOS -- 3.0    Liver Function Tests:  Lab 02/28/12 1614  AST 36  ALT 32  ALKPHOS 98  BILITOT 1.0  PROT 7.5  ALBUMIN 3.3*   No results found for this basename: LIPASE:5,AMYLASE:5 in the last 168 hours No results found for this basename: AMMONIA:3 in the last 168 hours  CBC:  Lab 02/29/12 0425 02/28/12 1614  WBC 8.6 7.4  NEUTROABS -- --  HGB 14.8 14.0  HCT 45.6 43.9  MCV 97.0 97.6  PLT 291 265    Cardiac Enzymes:  Lab 02/28/12 1534  CKTOTAL --  CKMB --  CKMBINDEX --  TROPONINI <0.30    BNP: BNP (last 3 results)  Basename 02/28/12 1534  PROBNP 1405.0*     Other results:  EKG:   Imaging: US Abdomen Limited  02/28/2012  *RADIOLOGY REPORT*  Clinical Data:  Abdominal bloating, evaluate for ascites for possible paracentesis  LIMITED ABDOMINAL ULTRASOUND - RIGHT UPPER QUADRANT  Comparison:  None.  Findings:  No ascites visualized.  IMPRESSION: No ascites visualized.   Original Report Authenticated By: Conchita Paris, M.D.    Dg Chest Port 1 View  02/28/2012  *RADIOLOGY REPORT*  Clinical Data: Central line placement.  PORTABLE CHEST - 1 VIEW  Comparison: Chest radiograph performed earlier today at 03:51 p.m.  Findings: The patient's right PICC appears to be coiled in the right axillary  region; this may be progressing distally along a right axillary vessel, and should be significantly retracted or removed.  Lung expansion is mildly decreased.  Mild vascular congestion is noted.  Mild scattered bilateral atelectasis is seen.  No definite pleural effusion or pneumothorax is identified.  The cardiomediastinal silhouette remains enlarged.  No acute osseous abnormalities are seen.  IMPRESSION:  1.  Right PICC appears to be coiled in the right axillary region; this may be progressing distally along the right axillary vessels, and should be significantly retracted or removed. 2.  Lungs hypoexpanded; mild scattered bilateral atelectasis seen. 3.  Mild vascular congestion and cardiomegaly again noted.  These results were called by telephone on 02/28/2012 at 11:33 p.m. to [facility], who verbally acknowledged these results.   Original Report Authenticated By: Santa Lighter, M.D.    Portable Chest 1 View  02/28/2012  *RADIOLOGY REPORT*  Clinical Data: Short of breath  PORTABLE CHEST - 1 VIEW  Comparison: 01/08/2010  Findings: Severe cardiomegaly.  Vascular congestion.  No Kerley B lines to suggest pulmonary edema.  No pneumothorax.  No definite pleural effusions. Subsegmental atelectasis at both lung bases.  IMPRESSION: Cardiomegaly and vascular congestion. Bibasilar atelectasis.   Original Report Authenticated By: Marybelle Killings, M.D.       Medications:     Scheduled Medications:    . aspirin EC  81 mg Oral Daily  . digoxin  0.25 mg Oral Daily  . enalapril  2.5 mg Oral BID  . furosemide  80 mg Intravenous Q8H  . insulin aspart  0-15 Units Subcutaneous TID WC  . metolazone  2.5 mg Oral Daily  . potassium chloride  40 mEq Oral BID  . simvastatin  20 mg Oral q1800  . sodium chloride  3 mL Intravenous Q12H  . sodium chloride  3 mL Intravenous Q12H  . timolol  1 drop Both Eyes Daily     Infusions:    . amiodarone (NEXTERONE PREMIX) 360 mg/200 mL dextrose 30 mg/hr (02/29/12 0225)      PRN Medications:  sodium chloride, acetaminophen, acetaminophen, sodium chloride, sodium chloride   Assessment:   1. Acute on chronic diastolic/systolic HF heart failure  2. Permanent AF - now with RVR  - on coumadin  3. Morbid obesity/anasarca  4. Sleep apnea with CPAP  5. DM2  6. Chest pressure  Plan/Discussion:    Patient volume status improving on IV diuresis.  Will continue current regimen with addition of spiro.  Watch renal function and K closely.  With coil in PICC will remove and have IR place today.  Transfer to stepdown for hemodynamic monitoring.  Consider adding low dose carvedilol once volume improves.  Give amio bolus as patient remains in afib with RVR  Length of Stay: [place], [facility] 02/29/2012, 7:51 AM  Patient seen and  examined with Leone Haven, PA-C. We discussed all aspects of the encounter. I agree with the assessment and plan as stated above.   Beginning to diurese but still a LONG way to go. Would consider lasix drip but don't want to keep him up all night without Foley. Agree with spiro. Will get PICC replaced by IR (thank you). Will move to our service and transfer to stepdown to measure CVPs and co-ox. Rebolus amio for AF with RVR. Will need R & LHC prior to d/c given newly reduced EF with massive cardiomegaly on CXR (? tachy-induced).   Sherard Sutch,MD 9:21 AM

## 2012-02-29 NOTE — Procedures (Signed)
Successful exchange of right arm PICC.  Length = 39 cm.  Tip in lower SVC.  Ready to use.

## 2012-02-29 NOTE — Progress Notes (Addendum)
Patient referred to CSW for an Advanced Directive. Met with patient- he was not interested in completing the document but was willing to receive the document for later perusal. Patient states that he has a daughter who lives in [place] who he wants to be his [place]. He has discussed with her- however he does not want her to be notified of this admission due to it being the Christmas holiday. Patient related "I'll call her after Christmas."  Patient was given the AD document and discussed how to complete it at a later time. Patient also discussed issues regarding lack of any insurance; he was covered by his work until recently and now is Investment banker, corporate but has been denied by both [employer] due to preexisting conditions.  Patient has attempted to enroll via the government phone number via the [employer] but states he was kept on hold for over and hour and he gave up.  He does not have a computer at home.  Patient states he will continue to work on this after he gets home from the hospital and will enlist his daughter's help as well. Message left for [facility]- Development worker, community re: above   He was appreciative of CSW's information and denies any further needs.  Patient transferred to 2900. CSW signing off.  Lorie Phenix. [place], [place]

## 2012-02-29 NOTE — Progress Notes (Signed)
Report called to [facility], RN on 2900. Will transfer pt. To stepdown unit. Maddelyn Rocca, Joselyn Glassman

## 2012-02-29 NOTE — Plan of Care (Signed)
Problem: Phase I Progression Outcomes Goal: EF % per last Echo/documented,Core Reminder form on chart Outcome: Completed/Met Date Met:  02/29/12 EF 20-25% per ECHO performed on 02/28/12

## 2012-02-29 NOTE — Progress Notes (Signed)
ANTICOAGULATION CONSULT NOTE - Follow Up Consult  Pharmacy Consult for Heparin Indication: atrial fibrillation  No Known Allergies  Patient Measurements: Height: 5\' 8"  (172.7 cm) Weight: 304 lb (137.893 kg) (b scale) IBW/kg (Calculated) : 68.4  Heparin dosing weight: 101 kg  Vital Signs: Temp: 98.2 F (36.8 C) (12/24 0532) Temp src: Oral (12/24 0532) BP: 122/106 mmHg (12/24 1029) Pulse Rate: 111  (12/24 1029)  Labs:  Basename 02/29/12 1015 02/29/12 0425 02/28/12 1614 02/28/12 1534  HGB -- 14.8 14.0 --  HCT -- 45.6 43.9 --  PLT -- 291 265 --  APTT -- -- -- --  LABPROT 22.5* -- 25.1* --  INR 2.08* -- 2.41* --  HEPARINUNFRC -- -- -- --  CREATININE -- 1.02 0.95 --  CKTOTAL -- -- -- --  CKMB -- -- -- --  TROPONINI -- -- -- <0.30    Estimated Creatinine Clearance: 104.8 ml/min (by C-G formula based on Cr of 1.02).  Assessment: 61yom on coumadin pta for afib, admitted with a therapeutic INR. Last dose 12/23. Coumadin now on hold for possible R/LHC and orders received for heparin when INR < 2. INR 2.08 today and ok per heart failure team to go ahead and start heparin.   Goal of Therapy:  Heparin level 0.3-0.7 units/ml Monitor platelets by anticoagulation protocol: Yes   Plan:  1) No bolus 2) Start heparin at 1200 units/hr 3) 6h heparin level 4) Daily heparin level and CBC  Deboraha Sprang 02/29/2012,11:47 AM

## 2012-02-29 NOTE — Care Management Note (Signed)
    Page 1 of 1   02/29/2012     12:59:09 PM   CARE MANAGEMENT NOTE 02/29/2012  Patient:  Raymond Pratt, Raymond Pratt   Account Number:  [id number]  Date Initiated:  02/29/2012  Documentation initiated by:  Elissa Hefty  Subjective/Objective Assessment:   adm w heart failure     Action/Plan:   lives alone, pcp dr Shon Baton   Anticipated DC Date:     Anticipated DC Plan:        DC Planning Services  CM consult      Choice offered to / List presented to:             Status of service:   Medicare Important Message given?   (If response is "NO", the following Medicare IM given date fields will be blank) Date Medicare IM given:   Date Additional Medicare IM given:    Discharge Disposition:    Per UR Regulation:  Reviewed for med. necessity/level of care/duration of stay  If discussed at [place] of Stay Meetings, dates discussed:    Comments:  12/24 1300 debbie Beckett Hickmon rn,bsn [id number]

## 2012-03-01 LAB — BASIC METABOLIC PANEL
CO2: 30 mEq/L (ref 19–32)
Chloride: 94 mEq/L — ABNORMAL LOW (ref 96–112)
GFR calc Af Amer: >90 mL/min (ref >90)
Potassium: 4 mEq/L (ref 3.5–5.1)
Sodium: 136 mEq/L (ref 135–145)

## 2012-03-01 LAB — GLUCOSE, CAPILLARY
Glucose-Capillary: 114 mg/dL — ABNORMAL HIGH (ref 70–99)
Glucose-Capillary: 160 mg/dL — ABNORMAL HIGH (ref 70–99)
Glucose-Capillary: 104 mg/dL — ABNORMAL HIGH (ref 70–99)

## 2012-03-01 LAB — HEPARIN LEVEL (UNFRACTIONATED): Heparin Unfractionated: 0.15 IU/mL — ABNORMAL LOW (ref 0.30–0.70)

## 2012-03-01 LAB — PROTIME-INR
INR: 1.93 — ABNORMAL HIGH (ref 0.00–1.49)
Prothrombin Time: 21.3 seconds — ABNORMAL HIGH (ref 11.6–15.2)

## 2012-03-01 LAB — CBC
HCT: 44.3 % (ref 39.0–52.0)
MCV: 95.3 fL (ref 78.0–100.0)
Platelets: 282 10*3/uL (ref 150–400)
RBC: 4.65 MIL/uL (ref 4.22–5.81)
WBC: 7.8 10*3/uL (ref 4.0–10.5)

## 2012-03-01 LAB — CARBOXYHEMOGLOBIN
Carboxyhemoglobin: 1.1 % (ref 0.5–1.5)
O2 Saturation: 50.2 %
Total hemoglobin: 15.4 g/dL (ref 13.5–18.0)

## 2012-03-01 MED ORDER — HEPARIN (PORCINE) IN NACL 100-0.45 UNIT/ML-% IJ SOLN
1700.0000 [IU]/h | INTRAMUSCULAR | Status: DC
  Administered 2012-03-01 – 2012-03-06 (×9): 1900 [IU]/h via INTRAVENOUS
  Administered 2012-03-06: 1700 [IU]/h via INTRAVENOUS
  Filled 2012-03-01 (×13): qty 250

## 2012-03-01 NOTE — Progress Notes (Signed)
ANTICOAGULATION CONSULT NOTE - Follow Up Consult  Pharmacy Consult for Heparin Indication: atrial fibrillation  No Known Allergies  Patient Measurements: Height: 5\' 8"  (172.7 cm) Weight: 304 lb (137.893 kg) (b scale) IBW/kg (Calculated) : 68.4  Heparin dosing weight: 101 kg  Vital Signs: Temp: 97.4 F (36.3 C) (12/24 2319) Temp src: Oral (12/24 2319) BP: 123/84 mmHg (12/25 0000)  Labs:  Basename 02/29/12 2206 02/29/12 1015 02/29/12 0425 02/28/12 1614 02/28/12 1534  HGB -- -- 14.8 14.0 --  HCT -- -- 45.6 43.9 --  PLT -- -- 291 265 --  APTT -- -- -- -- --  LABPROT -- 22.5* -- 25.1* --  INR -- 2.08* -- 2.41* --  HEPARINUNFRC <0.10* -- -- -- --  CREATININE -- -- 1.02 0.95 --  CKTOTAL -- -- -- -- --  CKMB -- -- -- -- --  TROPONINI -- -- -- -- <0.30    Estimated Creatinine Clearance: 104.8 ml/min (by C-G formula based on Cr of 1.02).  Assessment: 61yom on coumadin pta for afib, admitted with a therapeutic INR. Last dose 12/23. Coumadin now on hold for possible R/LHC and orders received for heparin when INR < 2. Heparin level (<0.1) is below-goal on 1200 units/hr. No problem with line / infusion and no bleeding per RN.   Goal of Therapy:  Heparin level 0.3-0.7 units/ml Monitor platelets by anticoagulation protocol: Yes   Plan:  1. Increase IV heparin to 1600 units/hr.  2. Heparin level in 6 hours.   Doy Mince, Shana Chute 03/01/2012,12:37 AM

## 2012-03-01 NOTE — Progress Notes (Addendum)
Subjective: He says fluid is coming off gradually. Legs itchy last night but better today. No pain.  Objective: Vital signs in last 24 hours: Temp:  [97.3 F (36.3 C)-97.7 F (36.5 C)] 97.4 F (36.3 C) (12/25 0808) Pulse Rate:  [105-114] 105  (12/25 0904) Resp:  [20] 20  (12/24 1538) BP: (104-154)/(63-90) 107/83 mmHg (12/25 0757) SpO2:  [94 %-100 %] 97 % (12/25 0757) Weight:  [133.8 kg (294 lb 15.6 oz)] 133.8 kg (294 lb 15.6 oz) (12/25 0500) Weight change: -5.364 kg (-11 lb 13.2 oz) Last BM Date: 02/28/12  Intake/Output from previous day: 12/24 0701 - 12/25 0700 In: 1729.3 [P.O.:600; I.V.:989.3; IV Piggyback:140] Out: [id number] [Urine:5775] Intake/Output this shift: Total I/O In: 693.8 [P.O.:480; I.V.:213.8] Out: 700 [Urine:700]  General appearance: alert, cooperative and appears stated age Resp: clear to auscultation bilaterally Cardio: irregularly irregular rhythm,  No sig, m/r/g. GI: soft, non-tender; bowel sounds normal; no masses,  no organomegaly Extremities: 2+ edema with some venous stasis dermatitis. Neurologic: Grossly normal   Lab Results:  Basename 03/01/12 0445 02/29/12 0425  WBC 7.8 8.6  HGB 14.7 14.8  HCT 44.3 45.6  PLT 282 291   BMET  Basename 03/01/12 0445 02/29/12 0425  NA 136 137  K 4.0 3.3*  CL 94* 98  CO2 30 27  GLUCOSE 230* 131*  BUN 21 21  CREATININE 1.01 1.02  CALCIUM 9.1 8.9   CMET CMP     Component Value Date/Time   NA 136 03/01/2012 0445   K 4.0 03/01/2012 0445   CL 94* 03/01/2012 0445   CO2 30 03/01/2012 0445   GLUCOSE 230* 03/01/2012 0445   BUN 21 03/01/2012 0445   CREATININE 1.01 03/01/2012 0445   CALCIUM 9.1 03/01/2012 0445   PROT 7.5 02/28/2012 1614   ALBUMIN 3.3* 02/28/2012 1614   AST 36 02/28/2012 1614   ALT 32 02/28/2012 1614   ALKPHOS 98 02/28/2012 1614   BILITOT 1.0 02/28/2012 1614   GFRNONAA 79* 03/01/2012 0445   GFRAA >90 03/01/2012 0445     Studies/Results: US Abdomen Limited  02/28/2012  *RADIOLOGY  REPORT*  Clinical Data:  Abdominal bloating, evaluate for ascites for possible paracentesis  LIMITED ABDOMINAL ULTRASOUND - RIGHT UPPER QUADRANT  Comparison:  None.  Findings:  No ascites visualized.  IMPRESSION: No ascites visualized.   Original Report Authenticated By: Conchita Paris, M.D.    Ir Fluoro Guide Cv Line Right  02/29/2012  *RADIOLOGY REPORT*  Clinical history:Malpositioned right arm PICC line.  PROCEDURE(S): EXCHANGE OF RIGHT ARM PICC LINE WITH FLUOROSCOPY  Physician: Stephan Minister. Henn, MD  Medications:None  Moderate sedation time:None  Fluoroscopy time: 1 minute  Procedure:The procedure was explained to the patient.  The risks and benefits of the procedure were discussed and the patient's questions were addressed.  Informed consent was obtained from the patient.  The right upper arm PICC line were prepped and draped in a sterile fashion.  Maximal barrier sterile technique was utilized including caps, mask, sterile gowns, sterile gloves, sterile drape, hand hygiene and skin antiseptic.  The catheter was pulled back and cut.  A 0.018 wire was advanced centrally.  A peel-away sheath was placed.  A double-lumen power PICC line was cut to 39 cm.  Catheter was advanced through the peel-away sheath to the central venous system.  Catheter was positioned in the lower SVC.  Both lumens aspirated and flushed well.  The skin was anesthetized with lidocaine and the catheter was sutured to the skin.  Findings: The existing  PICC line appeared to be in the right cephalic vein and pointing towards the right brachial vein in the axillary region.  New catheter tip is in the lower SVC.  Complications: None  Impression:Successful exchange of the right arm PICC line.   Original Report Authenticated By: Markus Daft, M.D.    Dg Chest Port 1 View  02/28/2012  *RADIOLOGY REPORT*  Clinical Data: Central line placement.  PORTABLE CHEST - 1 VIEW  Comparison: Chest radiograph performed earlier today at 03:51 p.m.  Findings: The  patient's right PICC appears to be coiled in the right axillary region; this may be progressing distally along a right axillary vessel, and should be significantly retracted or removed.  Lung expansion is mildly decreased.  Mild vascular congestion is noted.  Mild scattered bilateral atelectasis is seen.  No definite pleural effusion or pneumothorax is identified.  The cardiomediastinal silhouette remains enlarged.  No acute osseous abnormalities are seen.  IMPRESSION:  1.  Right PICC appears to be coiled in the right axillary region; this may be progressing distally along the right axillary vessels, and should be significantly retracted or removed. 2.  Lungs hypoexpanded; mild scattered bilateral atelectasis seen. 3.  Mild vascular congestion and cardiomegaly again noted.  These results were called by telephone on 02/28/2012 at 11:33 p.m. to [facility], who verbally acknowledged these results.   Original Report Authenticated By: Santa Lighter, M.D.    Portable Chest 1 View  02/28/2012  *RADIOLOGY REPORT*  Clinical Data: Short of breath  PORTABLE CHEST - 1 VIEW  Comparison: 01/08/2010  Findings: Severe cardiomegaly.  Vascular congestion.  No Kerley B lines to suggest pulmonary edema.  No pneumothorax.  No definite pleural effusions. Subsegmental atelectasis at both lung bases.  IMPRESSION: Cardiomegaly and vascular congestion. Bibasilar atelectasis.   Original Report Authenticated By: Marybelle Killings, M.D.     Medications: I have reviewed the patient's current medications.     Marland Kitchen aspirin EC  81 mg Oral Daily  . digoxin  0.25 mg Oral Daily  . enalapril  2.5 mg Oral BID  . furosemide  80 mg Intravenous Q8H  . insulin aspart  0-15 Units Subcutaneous TID WC  . metolazone  2.5 mg Oral Daily  . potassium chloride  40 mEq Oral TID  . simvastatin  20 mg Oral q1800  . sodium chloride  3 mL Intravenous Q12H  . sodium chloride  3 mL Intravenous Q12H  . spironolactone  12.5 mg Oral Daily  . timolol  1 drop Both  Eyes Daily   CBG (last 3)   Basename 03/01/12 0813 02/29/12 2126 02/29/12 1701  GLUCAP 146* 123* 98      Assessment/Plan:  Active Problems:  DIABETES MELLITUS,he is not uncontrolled, last a1c 6.8% in the office and he was only on a dppIV inhibitor-cbg's good on current regimen.  ATRIAL FIBRILLATION WITH RAPID VENTRICULAR RESPONSE-per cardiology.  Acute on chronic combined systolic and diastolic heart failure-plan reviewed as per Dr. Jeffie Pollock  Edema-follow Obesity-follow.   LOS: 2 days   Jerlyn Ly, MD 03/01/2012, 11:03 AM

## 2012-03-01 NOTE — Progress Notes (Signed)
ANTICOAGULATION CONSULT NOTE - Follow Up Consult  Pharmacy Consult for Heparin Indication: atrial fibrillation  No Known Allergies  Patient Measurements: Height: 5\' 8"  (172.7 cm) Weight: 294 lb 15.6 oz (133.8 kg) IBW/kg (Calculated) : 68.4  Heparin dosing weight: 101 kg  Vital Signs: Temp: 97.7 F (36.5 C) (12/25 0343) Temp src: Oral (12/25 0343) BP: 104/63 mmHg (12/25 0400)  Labs:  Basename 03/01/12 0445 03/01/12 0425 02/29/12 2206 02/29/12 1015 02/29/12 0425 02/28/12 1614 02/28/12 1534  HGB 14.7 -- -- -- 14.8 -- --  HCT 44.3 -- -- -- 45.6 43.9 --  PLT 282 -- -- -- 291 265 --  APTT -- -- -- -- -- -- --  LABPROT 21.3* -- -- 22.5* -- 25.1* --  INR 1.93* -- -- 2.08* -- 2.41* --  HEPARINUNFRC -- 0.15* <0.10* -- -- -- --  CREATININE 1.01 -- -- -- 1.02 0.95 --  CKTOTAL -- -- -- -- -- -- --  CKMB -- -- -- -- -- -- --  TROPONINI -- -- -- -- -- -- <0.30    Estimated Creatinine Clearance: 104.1 ml/min (by C-G formula based on Cr of 1.01).  Assessment: 61yom on coumadin pta for afib, admitted with a therapeutic INR. Last dose 12/23. Coumadin now on hold for possible R/LHC and orders received for heparin when INR < 2. Heparin level (0.15) is below-goal on 1600 units/hr.  The heparin level was drawn about 4 hrs post dose increase.  Goal of Therapy:  Heparin level 0.3-0.7 units/ml Monitor platelets by anticoagulation protocol: Yes   Plan:  1. Increase IV heparin to 1900 units/hr.  2. Heparin level in 6 hours.   Hildred Laser, Pharm D 03/01/2012 7:43 AM

## 2012-03-01 NOTE — Progress Notes (Signed)
Advanced Heart Failure Rounding Note   Subjective:    Raymond Pratt is a 61 y.o. gentlemen with history of morbid obesity, chronic atrial fibrillation on coumadin (diagnosed in 2010 with failed DCCV), diastolic heart failure (per echo 2010), HTN and sleep apnea with CPAP. Also has history of DM (dx 10 years ago) and left eye surgery due to trauma from nail gun accident.  He has been admitted by Dr. Virgina Jock for progressive edema that has "spread to whole body." Pertinent labs include proBNP 1405, Cr 0.95, Albumin 3.3, INR 2.41. He is in rapid AF with HRs in 110-150 range. CXR with cardiomegaly and vascular congestion and bibasilar atelectasis. No ascites noted on abdominal u/s Wt 306 pounds on admit. He has been placed on IV lasix 80 mg BID. Repeat echo showed LVEF fallen 20-25% (nml 2010), diffuse hypokinesis. Trivial AI, mild MR, LA mildly dilated, RV mod dilated with mod reduced systolic function, PAPP 46 mmHg.   Diuresing briskly on IV lasix. Weight down 12 pounds so far. Feeling better. HR improved with IV amio. CVP still in 20s. Co-ox 50%.   Objective:   Weight Range:  Vital Signs:   Temp:  [97.3 F (36.3 C)-97.7 F (36.5 C)] 97.4 F (36.3 C) (12/25 0808) Pulse Rate:  [105-114] 105  (12/25 0904) Resp:  [20] 20  (12/24 1538) BP: (104-154)/(63-106) 107/83 mmHg (12/25 0757) SpO2:  [94 %-100 %] 97 % (12/25 0757) Weight:  [133.8 kg (294 lb 15.6 oz)] 133.8 kg (294 lb 15.6 oz) (12/25 0500) Last BM Date: 02/28/12  Weight change: Filed Weights   02/28/12 1445 02/29/12 0532 03/01/12 0500  Weight: 139.164 kg (306 lb 12.8 oz) 137.893 kg (304 lb) 133.8 kg (294 lb 15.6 oz)    Intake/Output:   Intake/Output Summary (Last 24 hours) at 03/01/12 1018 Last data filed at 03/01/12 1000  Gross per 24 hour  Intake 1997.44 ml  Output   5175 ml  Net -3177.56 ml     Physical Exam: General: Obese, chronically-ill appearing. No resp difficulty  HEENT: normal  Neck: supple. Very thick. Unable to  assess JVP . Carotids 2+ bilat; no bruits. No lymphadenopathy or thryomegaly appreciated.  Cor: PMI nonpalpable. Irregularly irreg distant heart sounds  Lungs: clear  Abdomen: nontender, obese, + distention. Good bowel sounds.  Extremities: no cyanosis, clubbing, rash, 3+ edema up to thighs and into scrotum with weeping areas  Neuro: alert & orientedx3, cranial nerves grossly intact. moves all 4 extremities w/o difficulty. Affect pleasant   Telemetry: afib   Labs: Basic Metabolic Panel:  Lab [id number] 0445 02/29/12 0425 02/28/12 1614  NA 136 137 138  K 4.0 3.3* 3.6  CL 94* 98 99  CO2 30 27 30   GLUCOSE 230* 131* 145*  BUN 21 21 19   CREATININE 1.01 1.02 0.95  CALCIUM 9.1 8.9 9.1  MG -- -- 2.2  PHOS -- -- 3.0    Liver Function Tests:  Lab 02/28/12 1614  AST 36  ALT 32  ALKPHOS 98  BILITOT 1.0  PROT 7.5  ALBUMIN 3.3*   No results found for this basename: LIPASE:5,AMYLASE:5 in the last 168 hours No results found for this basename: AMMONIA:3 in the last 168 hours  CBC:  Lab 03/01/12 0445 02/29/12 0425 02/28/12 1614  WBC 7.8 8.6 7.4  NEUTROABS -- -- --  HGB 14.7 14.8 14.0  HCT 44.3 45.6 43.9  MCV 95.3 97.0 97.6  PLT 282 291 265    Cardiac Enzymes:  Lab 02/28/12 1534  CKTOTAL --  CKMB --  CKMBINDEX --  TROPONINI <0.30    BNP: BNP (last 3 results)  Basename 02/28/12 1534  PROBNP 1405.0*     Other results:  EKG:   Imaging: US Abdomen Limited  02/28/2012  *RADIOLOGY REPORT*  Clinical Data:  Abdominal bloating, evaluate for ascites for possible paracentesis  LIMITED ABDOMINAL ULTRASOUND - RIGHT UPPER QUADRANT  Comparison:  None.  Findings:  No ascites visualized.  IMPRESSION: No ascites visualized.   Original Report Authenticated By: Conchita Paris, M.D.    Ir Fluoro Guide Cv Line Right  02/29/2012  *RADIOLOGY REPORT*  Clinical history:Malpositioned right arm PICC line.  PROCEDURE(S): EXCHANGE OF RIGHT ARM PICC LINE WITH FLUOROSCOPY  Physician: Stephan Minister.  Henn, MD  Medications:None  Moderate sedation time:None  Fluoroscopy time: 1 minute  Procedure:The procedure was explained to the patient.  The risks and benefits of the procedure were discussed and the patient's questions were addressed.  Informed consent was obtained from the patient.  The right upper arm PICC line were prepped and draped in a sterile fashion.  Maximal barrier sterile technique was utilized including caps, mask, sterile gowns, sterile gloves, sterile drape, hand hygiene and skin antiseptic.  The catheter was pulled back and cut.  A 0.018 wire was advanced centrally.  A peel-away sheath was placed.  A double-lumen power PICC line was cut to 39 cm.  Catheter was advanced through the peel-away sheath to the central venous system.  Catheter was positioned in the lower SVC.  Both lumens aspirated and flushed well.  The skin was anesthetized with lidocaine and the catheter was sutured to the skin.  Findings: The existing PICC line appeared to be in the right cephalic vein and pointing towards the right brachial vein in the axillary region.  New catheter tip is in the lower SVC.  Complications: None  Impression:Successful exchange of the right arm PICC line.   Original Report Authenticated By: Markus Daft, M.D.    Dg Chest Port 1 View  02/28/2012  *RADIOLOGY REPORT*  Clinical Data: Central line placement.  PORTABLE CHEST - 1 VIEW  Comparison: Chest radiograph performed earlier today at 03:51 p.m.  Findings: The patient's right PICC appears to be coiled in the right axillary region; this may be progressing distally along a right axillary vessel, and should be significantly retracted or removed.  Lung expansion is mildly decreased.  Mild vascular congestion is noted.  Mild scattered bilateral atelectasis is seen.  No definite pleural effusion or pneumothorax is identified.  The cardiomediastinal silhouette remains enlarged.  No acute osseous abnormalities are seen.  IMPRESSION:  1.  Right PICC appears to  be coiled in the right axillary region; this may be progressing distally along the right axillary vessels, and should be significantly retracted or removed. 2.  Lungs hypoexpanded; mild scattered bilateral atelectasis seen. 3.  Mild vascular congestion and cardiomegaly again noted.  These results were called by telephone on 02/28/2012 at 11:33 p.m. to [facility], who verbally acknowledged these results.   Original Report Authenticated By: Santa Lighter, M.D.    Portable Chest 1 View  02/28/2012  *RADIOLOGY REPORT*  Clinical Data: Short of breath  PORTABLE CHEST - 1 VIEW  Comparison: 01/08/2010  Findings: Severe cardiomegaly.  Vascular congestion.  No Kerley B lines to suggest pulmonary edema.  No pneumothorax.  No definite pleural effusions. Subsegmental atelectasis at both lung bases.  IMPRESSION: Cardiomegaly and vascular congestion. Bibasilar atelectasis.   Original Report Authenticated By: Marybelle Killings, M.D.  Medications:     Scheduled Medications:    . aspirin EC  81 mg Oral Daily  . digoxin  0.25 mg Oral Daily  . enalapril  2.5 mg Oral BID  . furosemide  80 mg Intravenous Q8H  . insulin aspart  0-15 Units Subcutaneous TID WC  . metolazone  2.5 mg Oral Daily  . potassium chloride  40 mEq Oral TID  . simvastatin  20 mg Oral q1800  . sodium chloride  3 mL Intravenous Q12H  . sodium chloride  3 mL Intravenous Q12H  . spironolactone  12.5 mg Oral Daily  . timolol  1 drop Both Eyes Daily    Infusions:    . amiodarone (NEXTERONE PREMIX) 360 mg/200 mL dextrose 30 mg/hr (03/01/12 0620)  . heparin 1,900 Units/hr (03/01/12 0954)    PRN Medications: sodium chloride, acetaminophen, acetaminophen, sodium chloride, sodium chloride   Assessment:   1. Acute on chronic diastolic/systolic HF heart failure  2. Permanent AF - now with RVR  - on coumadin  3. Morbid obesity/anasarca  4. Sleep apnea with CPAP  5. DM2  6. Chest pressure  Plan/Discussion:    Diuresing well but still  a LONG way to go. Will continue current regimen. Co-ox of 50% suggests low output but currently stable without inotropes. May need to consider milrinone at some point. Continue amio and heparin for AF.  Will need R & LHC prior to d/c given newly reduced EF with massive cardiomegaly on CXR (? tachy-induced).   Al Gagen,MD 10:18 AM

## 2012-03-01 NOTE — Progress Notes (Signed)
ANTICOAGULATION CONSULT NOTE - Follow Up Consult  Pharmacy Consult for Heparin Indication: atrial fibrillation  No Known Allergies  Patient Measurements: Height: 5\' 8"  (172.7 cm) Weight: 294 lb 15.6 oz (133.8 kg) IBW/kg (Calculated) : 68.4  Heparin dosing weight: 101 kg  Vital Signs: Temp: 97.9 F (36.6 C) (12/25 1305) Temp src: Oral (12/25 1305) BP: 119/78 mmHg (12/25 1307) Pulse Rate: 97  (12/25 1305)  Labs:  Basename 03/01/12 1250 03/01/12 0445 03/01/12 0425 02/29/12 2206 02/29/12 1015 02/29/12 0425 02/28/12 1614 02/28/12 1534  HGB -- 14.7 -- -- -- 14.8 -- --  HCT -- 44.3 -- -- -- 45.6 43.9 --  PLT -- 282 -- -- -- 291 265 --  APTT -- -- -- -- -- -- -- --  LABPROT -- 21.3* -- -- 22.5* -- 25.1* --  INR -- 1.93* -- -- 2.08* -- 2.41* --  HEPARINUNFRC 0.31 -- 0.15* <0.10* -- -- -- --  CREATININE -- 1.01 -- -- -- 1.02 0.95 --  CKTOTAL -- -- -- -- -- -- -- --  CKMB -- -- -- -- -- -- -- --  TROPONINI -- -- -- -- -- -- -- <0.30    Estimated Creatinine Clearance: 104.1 ml/min (by C-G formula based on Cr of 1.01).  Assessment: 61yom on coumadin pta for afib, admitted with a therapeutic INR. Last dose 12/23. Coumadin now on hold for possible R/LHC and orders received for heparin when INR < 2. Heparin level (0.31 is at on 1900 units/hr.  The heparin level was drawn about 3 hrs post dose increase.  Goal of Therapy:  Heparin level 0.3-0.7 units/ml Monitor platelets by anticoagulation protocol: Yes   Plan:   -No heparin changes -Will recheck a level in am  Hildred Laser, Pharm D 03/01/2012 2:14 PM

## 2012-03-02 ENCOUNTER — Encounter (HOSPITAL_COMMUNITY)
Admission: AD | Disposition: A | Discharge status: Home / Self Care | Payer: Self-pay | Source: Ambulatory Visit | Attending: Internal Medicine

## 2012-03-02 LAB — GLUCOSE, CAPILLARY
Glucose-Capillary: 119 mg/dL — ABNORMAL HIGH (ref 70–99)
Glucose-Capillary: 106 mg/dL — ABNORMAL HIGH (ref 70–99)

## 2012-03-02 LAB — CBC
Hemoglobin: 16.2 g/dL (ref 13.0–17.0)
MCH: 31.9 pg (ref 26.0–34.0)
Platelets: 249 10*3/uL (ref 150–400)
RBC: 5.08 MIL/uL (ref 4.22–5.81)
WBC: 9.3 10*3/uL (ref 4.0–10.5)

## 2012-03-02 LAB — BASIC METABOLIC PANEL
Calcium: 9.7 mg/dL (ref 8.4–10.5)
GFR calc non Af Amer: 70 mL/min — ABNORMAL LOW (ref >90)
Glucose, Bld: 138 mg/dL — ABNORMAL HIGH (ref 70–99)
Potassium: 4.2 mEq/L (ref 3.5–5.1)
Sodium: 132 mEq/L — ABNORMAL LOW (ref 135–145)

## 2012-03-02 LAB — CARBOXYHEMOGLOBIN
Carboxyhemoglobin: 1.5 % (ref 0.5–1.5)
O2 Saturation: 62.9 %
Total hemoglobin: 16.2 g/dL (ref 13.5–18.0)

## 2012-03-02 LAB — HEPARIN LEVEL (UNFRACTIONATED): Heparin Unfractionated: 0.44 IU/mL (ref 0.30–0.70)

## 2012-03-02 LAB — PROTIME-INR: INR: 1.62 — ABNORMAL HIGH (ref 0.00–1.49)

## 2012-03-02 SURGERY — PERCUTANEOUS CORONARY STENT INTERVENTION (PCI-S)
Anesthesia: LOCAL

## 2012-03-02 MED ORDER — FUROSEMIDE 10 MG/ML IJ SOLN
80.0000 mg | Freq: Three times a day (TID) | INTRAMUSCULAR | Status: DC
  Administered 2012-03-02 (×3): 80 mg via INTRAVENOUS
  Filled 2012-03-02 (×4): qty 8

## 2012-03-02 MED ORDER — METOPROLOL SUCCINATE ER 25 MG PO TB24
25.0000 mg | ORAL_TABLET | Freq: Every day | ORAL | Status: DC
  Administered 2012-03-02 – 2012-03-05 (×4): 25 mg via ORAL
  Filled 2012-03-02 (×5): qty 1

## 2012-03-02 MED ORDER — TORSEMIDE 20 MG PO TABS
60.0000 mg | ORAL_TABLET | Freq: Every day | ORAL | Status: DC
  Filled 2012-03-02: qty 3

## 2012-03-02 MED ORDER — AMIODARONE HCL 200 MG PO TABS
400.0000 mg | ORAL_TABLET | Freq: Two times a day (BID) | ORAL | Status: DC
  Administered 2012-03-02 – 2012-03-05 (×8): 400 mg via ORAL
  Filled 2012-03-02 (×10): qty 2

## 2012-03-02 MED ORDER — METOPROLOL SUCCINATE ER 50 MG PO TB24
50.0000 mg | ORAL_TABLET | Freq: Every day | ORAL | Status: DC
  Filled 2012-03-02: qty 1

## 2012-03-02 MED ORDER — AMIODARONE HCL 200 MG PO TABS
200.0000 mg | ORAL_TABLET | Freq: Two times a day (BID) | ORAL | Status: DC
  Filled 2012-03-02 (×2): qty 1

## 2012-03-02 MED ORDER — METOLAZONE 2.5 MG PO TABS
2.5000 mg | ORAL_TABLET | Freq: Every day | ORAL | Status: DC
  Administered 2012-03-02 – 2012-03-04 (×3): 2.5 mg via ORAL
  Filled 2012-03-02 (×4): qty 1

## 2012-03-02 MED ORDER — CARVEDILOL 3.125 MG PO TABS
3.1250 mg | ORAL_TABLET | Freq: Two times a day (BID) | ORAL | Status: DC
  Filled 2012-03-02 (×2): qty 1

## 2012-03-02 MED FILL — Chlorhexidine Gluconate Liquid 4%: CUTANEOUS | Qty: 45 | Status: AC

## 2012-03-02 NOTE — Progress Notes (Signed)
Amiodarone gtt d/ced after po given. Continue to monitor.

## 2012-03-02 NOTE — Progress Notes (Addendum)
CARDIAC REHAB PHASE I   PRE:  Rate/Rhythm: afib/pvc 97 BP:  Supine:   Sitting: 105/75  Standing:    SaO2: 96 RA  MODE:  Ambulation: 600 ft   POST:  Rate/Rhythem: afib 130's  BP:  Supine:  Sitting: 124/89  Standing:    SaO2: 96 RA  Ambulated in hallway x 2 assist with no complaints.  Pt denies any sob or discomfort.  Pt back to chair with call bell in place with no needs at this time [id number] Donney Dice

## 2012-03-02 NOTE — Progress Notes (Signed)
Pt. States he can place cpap on at his convenience. Pt. States he wears cpap at home and has the same machine. RT will continue to monitor pt.

## 2012-03-02 NOTE — Progress Notes (Signed)
Subjective: Feels a lot better than admission Down 20#s Fluid is coming off. Less Edema  Objective: Vital signs in last 24 hours: Temp:  [97.4 F (36.3 C)-98 F (36.7 C)] 97.5 F (36.4 C) (12/26 0500) Pulse Rate:  [86-108] 108  (12/25 1513) Resp:  [18-20] 20  (12/26 0500) BP: (97-148)/(63-93) 97/79 mmHg (12/26 0500) SpO2:  [96 %-99 %] 99 % (12/26 0500) Weight:  [129.9 kg (286 lb 6 oz)] 129.9 kg (286 lb 6 oz) (12/26 0500) Weight change: -3.9 kg (-8 lb 9.6 oz) Last BM Date: 02/28/12  Intake/Output from previous day: 12/25 0701 - 12/26 0700 In: 2261.8 [P.O.:960; I.V.:1277.8; IV Piggyback:24] Out: 6750 [Urine:6750] Intake/Output this shift:    General appearance: alert, cooperative and appears stated age Resp: clear to auscultation bilaterally Cardio: irregularly irregular rhythm, Tachy,  No sig, m/r/g. GI: soft, non-tender; bowel sounds normal; no masses,  no organomegaly Extremities: 2+ edema with some venous stasis dermatitis. Neurologic: Grossly normal   Lab Results:  Basename 03/02/12 0420 03/01/12 0445  WBC 9.3 7.8  HGB 16.2 14.7  HCT 47.5 44.3  PLT 249 282   BMET  Basename 03/02/12 0420 03/01/12 0445  NA 132* 136  K 4.2 4.0  CL 91* 94*  CO2 31 30  GLUCOSE 138* 230*  BUN 24* 21  CREATININE 1.12 1.01  CALCIUM 9.7 9.1   CMET CMP     Component Value Date/Time   NA 132* 03/02/2012 0420   K 4.2 03/02/2012 0420   CL 91* 03/02/2012 0420   CO2 31 03/02/2012 0420   GLUCOSE 138* 03/02/2012 0420   BUN 24* 03/02/2012 0420   CREATININE 1.12 03/02/2012 0420   CALCIUM 9.7 03/02/2012 0420   PROT 7.5 02/28/2012 1614   ALBUMIN 3.3* 02/28/2012 1614   AST 36 02/28/2012 1614   ALT 32 02/28/2012 1614   ALKPHOS 98 02/28/2012 1614   BILITOT 1.0 02/28/2012 1614   GFRNONAA 70* 03/02/2012 0420   GFRAA 81* 03/02/2012 0420     Studies/Results: Ir Fluoro Guide Cv Line Right  02/29/2012  *RADIOLOGY REPORT*  Clinical history:Malpositioned right arm PICC line.   PROCEDURE(S): EXCHANGE OF RIGHT ARM PICC LINE WITH FLUOROSCOPY  Physician: Stephan Minister. Henn, MD  Medications:None  Moderate sedation time:None  Fluoroscopy time: 1 minute  Procedure:The procedure was explained to the patient.  The risks and benefits of the procedure were discussed and the patient's questions were addressed.  Informed consent was obtained from the patient.  The right upper arm PICC line were prepped and draped in a sterile fashion.  Maximal barrier sterile technique was utilized including caps, mask, sterile gowns, sterile gloves, sterile drape, hand hygiene and skin antiseptic.  The catheter was pulled back and cut.  A 0.018 wire was advanced centrally.  A peel-away sheath was placed.  A double-lumen power PICC line was cut to 39 cm.  Catheter was advanced through the peel-away sheath to the central venous system.  Catheter was positioned in the lower SVC.  Both lumens aspirated and flushed well.  The skin was anesthetized with lidocaine and the catheter was sutured to the skin.  Findings: The existing PICC line appeared to be in the right cephalic vein and pointing towards the right brachial vein in the axillary region.  New catheter tip is in the lower SVC.  Complications: None  Impression:Successful exchange of the right arm PICC line.   Original Report Authenticated By: Markus Daft, M.D.     Medications: I have reviewed the patient's current medications.     Marland Kitchen  aspirin EC  81 mg Oral Daily  . digoxin  0.25 mg Oral Daily  . enalapril  2.5 mg Oral BID  . furosemide  80 mg Intravenous Q8H  . insulin aspart  0-15 Units Subcutaneous TID WC  . metolazone  2.5 mg Oral Daily  . potassium chloride  40 mEq Oral TID  . simvastatin  20 mg Oral q1800  . sodium chloride  3 mL Intravenous Q12H  . sodium chloride  3 mL Intravenous Q12H  . spironolactone  12.5 mg Oral Daily  . timolol  1 drop Both Eyes Daily   CBG (last 3)   Basename 03/01/12 2041 03/01/12 1637 03/01/12 1215  GLUCAP 104* 160*  114*      Assessment/Plan:  CHF R and L CHF/Anasarca/Cor Pulmonale/Obesity Hypoventilation - Now EF 20-25%. Continue current Rxs as he is doing Markedly better.   May need Cath.  Afterload reduction started.  PICC.   HTN - BP is excellent Afib with RVR -Still Tachy but Better HR on IV Amio and may do IV Dig.  Coumadin with Goal INR 2-3, but current plan is to hold coumadin and use IV Hep gtt  H/O Failed DCCV   MORBID OBESITY - Needs wt loss.   HYPERLIPIDEMIA on Pravastatin Sodium 40 Mg Tabs (Pravastatin sodium) ... With last LDL: 103 (08/24/2011)   OBSTRUCTIVE SLEEP APNEA - CPAP HS  Legs irritated red and excoriated but no obvious cellulitis. WBC fine. Monitor  DVT Proph with Coumadin/Heparin  DIABETES MELLITUS - On Januvia/Onglyza outpatient and will just do ISS  He is off Glucophage.  CBGs are fine    LOS: 3 days   Precious Reel, MD 03/02/2012, 8:06 AM

## 2012-03-02 NOTE — Progress Notes (Signed)
Pt. States he can place cpap on at his convenience. Pt. Wears cpap at home, same equipment. RT will continue to monitor pt.

## 2012-03-02 NOTE — Progress Notes (Signed)
Nutrition Education Note:   RD consulted for CHF diet education. Pt was previously educated at this admission on weight loss tips.  Pt states that he follows with MD's for weight loss and CHF diet control. Well educated in both areas for foods to avoid, portion sizes, and healthy foods to include more often. Pt states that he does not think he has recently increased his sodium intake, but has been gaining fluid weight.   Pt was provided with "nutrition for heart failure" hand out from the [employer]. Pt familiar with information. Pt denies any additional questions on weight loss or sodium intake at this time.   Diet: Carb Mod Medium. Recommend adding sodium restriction to diet to reinforce diet education.  PO intake: 100%  Chart reviewed, no additional nutrition interventions warranted at this time. Please re-consult as needed.   Orson Slick RD, LDN Pager [phone] After Hours pager [phone]

## 2012-03-02 NOTE — Progress Notes (Signed)
Advanced Heart Failure Rounding Note   Subjective:    Raymond Pratt is a 61 y.o. gentlemen with history of morbid obesity, chronic atrial fibrillation on coumadin (diagnosed in 2010 with failed DCCV), diastolic heart failure (per echo 2010), HTN and sleep apnea with CPAP. Also has history of DM (dx 10 years ago) and left eye surgery due to trauma from nail gun accident.  He has been admitted by Dr. Virgina Jock for progressive edema that has "spread to whole body." Pertinent labs include proBNP 1405, Cr 0.95, Albumin 3.3, INR 2.41. He is in rapid AF with HRs in 110-150 range. CXR with cardiomegaly and vascular congestion and bibasilar atelectasis. No ascites noted on abdominal u/s Wt 306 pounds on admit. He has been placed on IV lasix 80 mg BID. Repeat echo showed LVEF fallen 20-25% (nml 2010), diffuse hypokinesis. Trivial AI, mild MR, LA mildly dilated, RV mod dilated with mod reduced systolic function, PAPP 46 mmHg.   Diuresing briskly on IV lasix and daily Metolazone. Weight down 20 pounds so far. Feeling better. HR improved with IV amio. CVP down to 8. Able to use CPAP  Denies SOB/PND/Orthopnea   CVP 8 this am (checked personally). Co-ox pending  Objective:   Weight Range:  Vital Signs:   Temp:  [97.4 F (36.3 C)-98 F (36.7 C)] 97.5 F (36.4 C) (12/26 0500) Pulse Rate:  [86-108] 108  (12/25 1513) Resp:  [18-20] 20  (12/26 0500) BP: (97-148)/(63-93) 97/79 mmHg (12/26 0500) SpO2:  [96 %-99 %] 99 % (12/26 0500) Weight:  [286 lb 6 oz (129.9 kg)] 286 lb 6 oz (129.9 kg) (12/26 0500) Last BM Date: 02/28/12  Weight change: Filed Weights   02/29/12 0532 03/01/12 0500 03/02/12 0500  Weight: 304 lb (137.893 kg) 294 lb 15.6 oz (133.8 kg) 286 lb 6 oz (129.9 kg)    Intake/Output:   Intake/Output Summary (Last 24 hours) at 03/02/12 0804 Last data filed at 03/02/12 0700  Gross per 24 hour  Intake 2206.1 ml  Output   6050 ml  Net -3843.9 ml     Physical Exam: CVP 8 General: Obese,  chronically-ill appearing. No resp difficulty sitting in chair HEENT: normal  Neck: supple. Very thick. Unable to assess JVP . Carotids 2+ bilat; no bruits. No lymphadenopathy or thryomegaly appreciated.  Cor: PMI nonpalpable. Irregular distant heart sounds  Lungs: clear  Abdomen: nontender, obese, + distention. Good bowel sounds.  Extremities: no cyanosis, clubbing, rash, LLE 2+ > RLE 1+ Neuro: alert & orientedx3, cranial nerves grossly intact. moves all 4 extremities w/o difficulty. Affect pleasant   Telemetry: afib 90-110  Labs: Basic Metabolic Panel:  Lab [id number] 0420 03/01/12 0445 02/29/12 0425 02/28/12 1614  NA 132* 136 137 138  K 4.2 4.0 3.3* 3.6  CL 91* 94* 98 99  CO2 31 30 27 30   GLUCOSE 138* 230* 131* 145*  BUN 24* 21 21 19   CREATININE 1.12 1.01 1.02 0.95  CALCIUM 9.7 9.1 8.9 --  MG -- -- -- 2.2  PHOS -- -- -- 3.0    Liver Function Tests:  Lab 02/28/12 1614  AST 36  ALT 32  ALKPHOS 98  BILITOT 1.0  PROT 7.5  ALBUMIN 3.3*   No results found for this basename: LIPASE:5,AMYLASE:5 in the last 168 hours No results found for this basename: AMMONIA:3 in the last 168 hours  CBC:  Lab 03/02/12 0420 03/01/12 0445 02/29/12 0425 02/28/12 1614  WBC 9.3 7.8 8.6 7.4  NEUTROABS -- -- -- --  HGB  16.2 14.7 14.8 14.0  HCT 47.5 44.3 45.6 43.9  MCV 93.5 95.3 97.0 97.6  PLT 249 282 291 265    Cardiac Enzymes:  Lab 02/28/12 1534  CKTOTAL --  CKMB --  CKMBINDEX --  TROPONINI <0.30    BNP: BNP (last 3 results)  Basename 02/28/12 1534  PROBNP 1405.0*     Other results:    Imaging: Ir Fluoro Guide Cv Line Right  02/29/2012  *RADIOLOGY REPORT*  Clinical history:Malpositioned right arm PICC line.  PROCEDURE(S): EXCHANGE OF RIGHT ARM PICC LINE WITH FLUOROSCOPY  Physician: Stephan Minister. Henn, MD  Medications:None  Moderate sedation time:None  Fluoroscopy time: 1 minute  Procedure:The procedure was explained to the patient.  The risks and benefits of the procedure  were discussed and the patient's questions were addressed.  Informed consent was obtained from the patient.  The right upper arm PICC line were prepped and draped in a sterile fashion.  Maximal barrier sterile technique was utilized including caps, mask, sterile gowns, sterile gloves, sterile drape, hand hygiene and skin antiseptic.  The catheter was pulled back and cut.  A 0.018 wire was advanced centrally.  A peel-away sheath was placed.  A double-lumen power PICC line was cut to 39 cm.  Catheter was advanced through the peel-away sheath to the central venous system.  Catheter was positioned in the lower SVC.  Both lumens aspirated and flushed well.  The skin was anesthetized with lidocaine and the catheter was sutured to the skin.  Findings: The existing PICC line appeared to be in the right cephalic vein and pointing towards the right brachial vein in the axillary region.  New catheter tip is in the lower SVC.  Complications: None  Impression:Successful exchange of the right arm PICC line.   Original Report Authenticated By: Markus Daft, M.D.      Medications:     Scheduled Medications:    . aspirin EC  81 mg Oral Daily  . digoxin  0.25 mg Oral Daily  . enalapril  2.5 mg Oral BID  . furosemide  80 mg Intravenous Q8H  . insulin aspart  0-15 Units Subcutaneous TID WC  . metolazone  2.5 mg Oral Daily  . potassium chloride  40 mEq Oral TID  . simvastatin  20 mg Oral q1800  . sodium chloride  3 mL Intravenous Q12H  . sodium chloride  3 mL Intravenous Q12H  . spironolactone  12.5 mg Oral Daily  . timolol  1 drop Both Eyes Daily    Infusions:    . amiodarone (NEXTERONE PREMIX) 360 mg/200 mL dextrose 30 mg/hr (03/02/12 0446)  . heparin 1,900 Units/hr (03/01/12 2321)    PRN Medications: sodium chloride, acetaminophen, acetaminophen, sodium chloride, sodium chloride   Assessment:   1. Acute on chronic diastolic/systolic HF heart failure (EF 20-25%) 2. Permanent AF - now with RVR  - on  coumadin  3. Morbid obesity/anasarca  4. Sleep apnea with CPAP  5. DM2  6. Chest pressure  Plan/Discussion:    Volume status improving. CVP down to 8. Weight down 8 pounds over night. But still with significant peripheral edema and he says his baseline weight is close to 250 (he is 285 today). Will continue IV lasix  And metolazone. Add TED hose. Diurese as much as possible - renal function permitting. Co-ox was low yesterday. We are repeating today. If remains low can consider milrinone to facilitate diuresis as needed. Probable cath tomorrow versus early next week.  Continue enalapril 2.5 mg  bid.    Switch Amiodarone to po and add low-dose Toprol (failed Heparin for DC-CV in past). Heparin per pharmacy. INR goal 2-3  Consult cardiac rehab. Consult dietitian.   Raymond Quince Rejoice Heatwole,MD 8:43 AM

## 2012-03-02 NOTE — Progress Notes (Signed)
ANTICOAGULATION CONSULT NOTE - Follow Up Consult  Pharmacy Consult for heparin Indication: atrial fibrillation  No Known Allergies  Patient Measurements: Height: 5\' 8"  (172.7 cm) Weight: 286 lb 6 oz (129.9 kg) IBW/kg (Calculated) : 68.4    Vital Signs: Temp: 98.2 F (36.8 C) (12/26 0815) Temp src: Oral (12/26 0815) BP: 100/43 mmHg (12/26 0812) Pulse Rate: 115  (12/26 0907)  Labs:  Basename 03/02/12 0420 03/01/12 1250 03/01/12 0445 03/01/12 0425 02/29/12 1015 02/29/12 0425 02/28/12 1534  HGB 16.2 -- 14.7 -- -- -- --  HCT 47.5 -- 44.3 -- -- 45.6 --  PLT 249 -- 282 -- -- 291 --  APTT -- -- -- -- -- -- --  LABPROT 18.7* -- 21.3* -- 22.5* -- --  INR 1.62* -- 1.93* -- 2.08* -- --  HEPARINUNFRC 0.44 0.31 -- 0.15* -- -- --  CREATININE 1.12 -- 1.01 -- -- 1.02 --  CKTOTAL -- -- -- -- -- -- --  CKMB -- -- -- -- -- -- --  TROPONINI -- -- -- -- -- -- <0.30    Estimated Creatinine Clearance: 92.3 ml/min (by C-G formula based on Cr of 1.12).   Medications:  Scheduled:    . amiodarone  400 mg Oral BID  . aspirin EC  81 mg Oral Daily  . digoxin  0.25 mg Oral Daily  . enalapril  2.5 mg Oral BID  . furosemide  80 mg Intravenous TID  . insulin aspart  0-15 Units Subcutaneous TID WC  . metolazone  2.5 mg Oral Daily  . metoprolol succinate  25 mg Oral Daily  . potassium chloride  40 mEq Oral TID  . simvastatin  20 mg Oral q1800  . sodium chloride  3 mL Intravenous Q12H  . sodium chloride  3 mL Intravenous Q12H  . spironolactone  12.5 mg Oral Daily  . timolol  1 drop Both Eyes Daily  . [DISCONTINUED] amiodarone  200 mg Oral BID  . [DISCONTINUED] carvedilol  3.125 mg Oral BID WC  . [DISCONTINUED] furosemide  80 mg Intravenous Q8H  . [DISCONTINUED] metolazone  2.5 mg Oral Daily  . [DISCONTINUED] metoprolol succinate  50 mg Oral Daily  . [DISCONTINUED] torsemide  60 mg Oral Daily    Assessment: 61 yo male on coumadin pta for afib ( PTA dose 5mg  daily except 7.5mg  on Wed and Sat  ) Coumadin now on hold for possible R/LHC and consult for heparin when INR < 2. (INR today 1.62) Heparin level therapeutic at 0.44. Noted cath possible planned for tomorrow 12/27 or next week. No noted bleeding.  Goal of Therapy:  Heparin level 0.3-0.7 units/ml Monitor platelets by anticoagulation protocol: Yes   Plan:  1. Continue current heparin gtt 1900 units/hour 2. Will recheck a level in am 3. Monitor CBC and s/sx of bleeding    Bola A. Calhoun, [place] Pharmacist Pager:[phone] Phone [phone] 03/02/2012 9:51 AM

## 2012-03-03 ENCOUNTER — Encounter (HOSPITAL_COMMUNITY)
Admission: AD | Disposition: A | Discharge status: Home / Self Care | Payer: Self-pay | Source: Ambulatory Visit | Attending: Internal Medicine

## 2012-03-03 LAB — GLUCOSE, CAPILLARY
Glucose-Capillary: 139 mg/dL — ABNORMAL HIGH (ref 70–99)
Glucose-Capillary: 131 mg/dL — ABNORMAL HIGH (ref 70–99)
Glucose-Capillary: 114 mg/dL — ABNORMAL HIGH (ref 70–99)

## 2012-03-03 LAB — BASIC METABOLIC PANEL
BUN: 27 mg/dL — ABNORMAL HIGH (ref 6–23)
Calcium: 9.6 mg/dL (ref 8.4–10.5)
Creatinine, Ser: 1.26 mg/dL (ref 0.50–1.35)
GFR calc non Af Amer: 60 mL/min — ABNORMAL LOW (ref >90)
Glucose, Bld: 119 mg/dL — ABNORMAL HIGH (ref 70–99)

## 2012-03-03 LAB — CARBOXYHEMOGLOBIN: Methemoglobin: 1 % (ref 0.0–1.5)

## 2012-03-03 LAB — HEPARIN LEVEL (UNFRACTIONATED): Heparin Unfractionated: 0.52 IU/mL (ref 0.30–0.70)

## 2012-03-03 LAB — CBC
MCH: 30.7 pg (ref 26.0–34.0)
MCHC: 32.8 g/dL (ref 30.0–36.0)
Platelets: 304 10*3/uL (ref 150–400)

## 2012-03-03 SURGERY — LEFT AND RIGHT HEART CATHETERIZATION WITH CORONARY ANGIOGRAM

## 2012-03-03 MED ORDER — FUROSEMIDE 10 MG/ML IJ SOLN
80.0000 mg | Freq: Two times a day (BID) | INTRAMUSCULAR | Status: DC
  Administered 2012-03-03: 80 mg via INTRAVENOUS
  Filled 2012-03-03 (×2): qty 8

## 2012-03-03 MED ORDER — POTASSIUM CHLORIDE CRYS ER 20 MEQ PO TBCR
40.0000 meq | EXTENDED_RELEASE_TABLET | Freq: Two times a day (BID) | ORAL | Status: DC
  Administered 2012-03-03 – 2012-03-05 (×6): 40 meq via ORAL
  Filled 2012-03-03 (×6): qty 2

## 2012-03-03 NOTE — Progress Notes (Signed)
ANTICOAGULATION CONSULT NOTE - Follow Up Consult  Pharmacy Consult for heparin Indication: atrial fibrillation  No Known Allergies  Patient Measurements: Height: 5\' 8"  (172.7 cm) Weight: 277 lb 12.5 oz (126 kg) IBW/kg (Calculated) : 68.4    Vital Signs: Temp: 98.6 F (37 C) (12/27 0730) Temp src: Oral (12/27 0730) BP: 97/71 mmHg (12/27 0730) Pulse Rate: 99  (12/27 0730)  Labs:  Basename 03/03/12 0400 03/02/12 0420 03/01/12 1250 03/01/12 0445  HGB 15.3 16.2 -- --  HCT 46.6 47.5 -- 44.3  PLT 304 249 -- 282  APTT -- -- -- --  LABPROT 17.4* 18.7* -- 21.3*  INR 1.47 1.62* -- 1.93*  HEPARINUNFRC 0.52 0.44 0.31 --  CREATININE 1.26 1.12 -- 1.01  CKTOTAL -- -- -- --  CKMB -- -- -- --  TROPONINI -- -- -- --    Estimated Creatinine Clearance: 80.6 ml/min (by C-G formula based on Cr of 1.26).   Medications:  Scheduled:    . amiodarone  400 mg Oral BID  . aspirin EC  81 mg Oral Daily  . digoxin  0.25 mg Oral Daily  . enalapril  2.5 mg Oral BID  . furosemide  80 mg Intravenous BID  . insulin aspart  0-15 Units Subcutaneous TID WC  . metolazone  2.5 mg Oral Daily  . metoprolol succinate  25 mg Oral Daily  . potassium chloride  40 mEq Oral BID  . simvastatin  20 mg Oral q1800  . sodium chloride  3 mL Intravenous Q12H  . sodium chloride  3 mL Intravenous Q12H  . spironolactone  12.5 mg Oral Daily  . timolol  1 drop Both Eyes Daily  . [DISCONTINUED] furosemide  80 mg Intravenous TID  . [DISCONTINUED] potassium chloride  40 mEq Oral TID    Assessment: 61 yo male on coumadin pta for afib ( PTA dose 5mg  daily except 7.5mg  on Wed and Sat ) Coumadin now on hold for possible R/LHC today 12/27. Pharmacy to manage heparin when INR < 2. Heparin level therapeutic at 0.52. CBC stable and no noted bleeding.  Goal of Therapy:  Heparin level 0.3-0.7 units/ml Monitor platelets by anticoagulation protocol: Yes   Plan:  1. Continue current heparin gtt 1900 units/hour 2. Will recheck  a level in am 3. F/u plans after cath  Bola A. Maloy, [place] Pharmacist [phone] Phone [phone] 03/03/2012 9:00 AM

## 2012-03-03 NOTE — Progress Notes (Signed)
Advanced Heart Failure Rounding Note   Subjective:    [name] is a 61 y.o. gentlemen with history of morbid obesity, chronic atrial fibrillation on coumadin (diagnosed in 2010 with failed DCCV), diastolic heart failure (per echo 2010), HTN and sleep apnea with CPAP. Also has history of DM (dx 10 years ago) and left eye surgery due to trauma from nail gun accident.  He has been admitted by Dr. Virgina Jock for progressive edema that has "spread to whole body." Pertinent labs include proBNP 1405, Cr 0.95, Albumin 3.3, INR 2.41. He is in rapid AF with HRs in 110-150 range. CXR with cardiomegaly and vascular congestion and bibasilar atelectasis. No ascites noted on abdominal u/s Wt 306 pounds on admit. He has been placed on IV lasix 80 mg BID. Repeat echo showed LVEF fallen 20-25% (nml 2010), diffuse hypokinesis. Trivial AI, mild MR, LA mildly dilated, RV mod dilated with mod reduced systolic function, PAPP 46 mmHg.   Diuresing briskly on IV lasix and daily Metolazone. Weight down 29 pounds so far. Yesterday he was switched to po amiodarone and Toprol XL added.  CVP 11 .Ambulated around unit.     Denies SOB/PND/Orthopnea   CVP 11  (checked personally). Co-ox 71.7  Objective:   Weight Range:  Vital Signs:   Temp:  [97.6 F (36.4 C)-98.6 F (37 C)] 98.6 F (37 C) (12/27 0730) Pulse Rate:  [93-115] 99  (12/27 0730) Resp:  [17-19] 17  (12/27 0730) BP: (92-109)/(43-78) 97/71 mmHg (12/27 0730) SpO2:  [94 %-98 %] 96 % (12/27 0730) Weight:  [277 lb 12.5 oz (126 kg)] 277 lb 12.5 oz (126 kg) (12/27 0424) Last BM Date: 02/28/12  Weight change: Filed Weights   03/01/12 0500 03/02/12 0500 03/03/12 0424  Weight: 294 lb 15.6 oz (133.8 kg) 286 lb 6 oz (129.9 kg) 277 lb 12.5 oz (126 kg)    Intake/Output:   Intake/Output Summary (Last 24 hours) at 03/03/12 0756 Last data filed at 03/03/12 0600  Gross per 24 hour  Intake 1091.4 ml  Output   4500 ml  Net -3408.6 ml     Physical Exam: CVP  11 General: Obese, chronically-ill appearing. No resp difficulty sitting in chair HEENT: normal  Neck: supple. Very thick. Unable to assess JVP . Carotids 2+ bilat; no bruits. No lymphadenopathy or thryomegaly appreciated.  Cor: PMI nonpalpable. Irregular distant heart sounds  Lungs: clear  Abdomen: nontender, obese, + distention. Good bowel sounds.  Extremities: no cyanosis, clubbing, rash, LLE 2+ > RLE 2+ into thigh Neuro: alert & orientedx3, cranial nerves grossly intact. moves all 4 extremities w/o difficulty. Affect pleasant   Telemetry: afib 90-110  Labs: Basic Metabolic Panel:  Lab [id number] 0400 03/02/12 0420 03/01/12 0445 02/29/12 0425 02/28/12 1614  NA 132* 132* 136 137 138  K 4.6 4.2 4.0 3.3* 3.6  CL 92* 91* 94* 98 99  CO2 29 31 30 27 30   GLUCOSE 119* 138* 230* 131* 145*  BUN 27* 24* 21 21 19   CREATININE 1.26 1.12 1.01 1.02 0.95  CALCIUM 9.6 9.7 9.1 -- --  MG -- -- -- -- 2.2  PHOS -- -- -- -- 3.0    Liver Function Tests:  Lab 02/28/12 1614  AST 36  ALT 32  ALKPHOS 98  BILITOT 1.0  PROT 7.5  ALBUMIN 3.3*   No results found for this basename: LIPASE:5,AMYLASE:5 in the last 168 hours No results found for this basename: AMMONIA:3 in the last 168 hours  CBC:  Lab 03/03/12 0400  03/02/12 0420 03/01/12 0445 02/29/12 0425 02/28/12 1614  WBC 8.4 9.3 7.8 8.6 7.4  NEUTROABS -- -- -- -- --  HGB 15.3 16.2 14.7 14.8 14.0  HCT 46.6 47.5 44.3 45.6 43.9  MCV 93.4 93.5 95.3 97.0 97.6  PLT 304 249 282 291 265    Cardiac Enzymes:  Lab 02/28/12 1534  CKTOTAL --  CKMB --  CKMBINDEX --  TROPONINI <0.30    BNP: BNP (last 3 results)  Basename 02/28/12 1534  PROBNP 1405.0*     Other results:    Imaging: No results found.   Medications:     Scheduled Medications:    . amiodarone  400 mg Oral BID  . aspirin EC  81 mg Oral Daily  . digoxin  0.25 mg Oral Daily  . enalapril  2.5 mg Oral BID  . furosemide  80 mg Intravenous TID  . insulin aspart  0-15  Units Subcutaneous TID WC  . metolazone  2.5 mg Oral Daily  . metoprolol succinate  25 mg Oral Daily  . potassium chloride  40 mEq Oral TID  . simvastatin  20 mg Oral q1800  . sodium chloride  3 mL Intravenous Q12H  . sodium chloride  3 mL Intravenous Q12H  . spironolactone  12.5 mg Oral Daily  . timolol  1 drop Both Eyes Daily    Infusions:    . heparin 1,900 Units/hr (03/03/12 0414)    PRN Medications: sodium chloride, acetaminophen, acetaminophen, sodium chloride, sodium chloride   Assessment:   1. Acute on chronic diastolic/systolic HF heart failure (EF 20-25%) 2. Permanent AF - now with RVR  - on coumadin  3. Morbid obesity/anasarca  4. Sleep apnea with CPAP  5. DM2  6. Chest pressure  Plan/Discussion:   Volume status continues to improve. Overall weight down 29 pounds. Cut back lasix to 80 mg IV bid. Cut back potassium. Creatinine trending up 1.1> 1.26. BP soft. Will not titrate HF medications.  NPO and possible cath later today.   Switch Amiodarone to po and add low-dose Toprol (failed Heparin for DC-CV in past). Heparin per pharmacy. INR goal 2-3  Consult cardiac rehab. Consult dietitian.   CLEGG,AMY,NP-C 7:56 AM  Patient seen and examined with Darrick Grinder, NP. We discussed all aspects of the encounter. I agree with the assessment and plan as stated above.   Still with significant volume overload. Co-ox much better. Would continue to push diuresis. Defer cath until Monday. Watch renal function closely.  Continue coumadin for AF.  Monique Hefty,MD 12:08 PM

## 2012-03-03 NOTE — Progress Notes (Signed)
CARDIAC REHAB PHASE I   PRE:  Rate/Rhythm: 96 afib    BP: sitting 96/78    SaO2:   MODE:  Ambulation: 800 ft   POST:  Rate/Rhythm: 119 afib    BP: sitting 150/91     SaO2:   Tolerated well. No c/o except boredom. BP high after walk. Pt can walk independently. [place], ACSM

## 2012-03-03 NOTE — Progress Notes (Signed)
Subjective: Feels a lot better than admission Down 30#s Fluid is coming off. Less Edema He is starting to get ansy and is asking when he can leave.  Objective: Vital signs in last 24 hours: Temp:  [97.6 F (36.4 C)-98.6 F (37 C)] 98.6 F (37 C) (12/27 0730) Pulse Rate:  [93-115] 99  (12/27 0730) Resp:  [17-19] 17  (12/27 0730) BP: (92-109)/(43-78) 97/71 mmHg (12/27 0730) SpO2:  [94 %-98 %] 96 % (12/27 0730) Weight:  [126 kg (277 lb 12.5 oz)] 126 kg (277 lb 12.5 oz) (12/27 0424) Weight change: -3.9 kg (-8 lb 9.6 oz) Last BM Date: 02/28/12  Intake/Output from previous day: 12/26 0701 - 12/27 0700 In: 1091.4 [P.O.:420; I.V.:655.4; IV Piggyback:16] Out: 4500 [Urine:4500] Intake/Output this shift:    General appearance: alert, cooperative and appears stated age Resp: clear to auscultation bilaterally Cardio: irregularly irregular rhythm, Tachy,  No sig, m/r/g. GI: soft, non-tender; bowel sounds normal; no masses,  no organomegaly Extremities: 1+ edema with some venous stasis dermatitis. Neurologic: Grossly normal   Lab Results:  Basename 03/03/12 0400 03/02/12 0420  WBC 8.4 9.3  HGB 15.3 16.2  HCT 46.6 47.5  PLT 304 249   BMET  Basename 03/03/12 0400 03/02/12 0420  NA 132* 132*  K 4.6 4.2  CL 92* 91*  CO2 29 31  GLUCOSE 119* 138*  BUN 27* 24*  CREATININE 1.26 1.12  CALCIUM 9.6 9.7   CMET CMP     Component Value Date/Time   NA 132* 03/03/2012 0400   K 4.6 03/03/2012 0400   CL 92* 03/03/2012 0400   CO2 29 03/03/2012 0400   GLUCOSE 119* 03/03/2012 0400   BUN 27* 03/03/2012 0400   CREATININE 1.26 03/03/2012 0400   CALCIUM 9.6 03/03/2012 0400   PROT 7.5 02/28/2012 1614   ALBUMIN 3.3* 02/28/2012 1614   AST 36 02/28/2012 1614   ALT 32 02/28/2012 1614   ALKPHOS 98 02/28/2012 1614   BILITOT 1.0 02/28/2012 1614   GFRNONAA 60* 03/03/2012 0400   GFRAA 70* 03/03/2012 0400     Studies/Results: No results found.  Medications: I have reviewed the  patient's current medications.     Marland Kitchen amiodarone  400 mg Oral BID  . aspirin EC  81 mg Oral Daily  . digoxin  0.25 mg Oral Daily  . enalapril  2.5 mg Oral BID  . furosemide  80 mg Intravenous TID  . insulin aspart  0-15 Units Subcutaneous TID WC  . metolazone  2.5 mg Oral Daily  . metoprolol succinate  25 mg Oral Daily  . potassium chloride  40 mEq Oral TID  . simvastatin  20 mg Oral q1800  . sodium chloride  3 mL Intravenous Q12H  . sodium chloride  3 mL Intravenous Q12H  . spironolactone  12.5 mg Oral Daily  . timolol  1 drop Both Eyes Daily   CBG (last 3)   Basename 03/03/12 0733 03/02/12 2149 03/02/12 1638  GLUCAP 139* 104* 106*      Assessment/Plan:  CHF R and L CHF/Anasarca/Cor Pulmonale/Obesity Hypoventilation - Now EF 20-25%. Continue current Rxs as he is doing Markedly better.  Now down close to 30 #s and feels much better.Sallye Lat he is starting to get ansy and wants to go home soon.  He still has a few more tests to run May get Cath later today.  Afterload reduction continued and BP is on lower side. Asymptomatic. PICC.   Hyponatremia - 132 and stable.  Monitor.  Cr  stable with all the diuresis.  HTN - BP is excellent to low Afib with RVR -HR 93-115 on PO Amio and BB  Coumadin with Goal INR 2-3, but current plan is to hold coumadin and continue IV Hep gtt  H/O Failed DCCV   MORBID OBESITY - Needs wt loss.   HYPERLIPIDEMIA on Pravastatin Sodium 40 Mg Tabs (Pravastatin sodium) ... With last LDL: 103 (08/24/2011)   OBSTRUCTIVE SLEEP APNEA - CPAP HS  Legs irritated red and excoriated/Venous stasis no obvious cellulitis. WBC fine. Monitor.  DVT Proph with Coumadin/Heparin  DIABETES MELLITUS - On Januvia/Onglyza outpatient and will just do ISS here and restart DPP4 on D/c He is off Glucophage.  CBGs are fine    LOS: 4 days   Precious Reel, MD 03/03/2012, 7:49 AM

## 2012-03-04 DIAGNOSIS — E871 Hypo-osmolality and hyponatremia: Secondary | ICD-10-CM

## 2012-03-04 DIAGNOSIS — G4733 Obstructive sleep apnea (adult) (pediatric): Secondary | ICD-10-CM

## 2012-03-04 DIAGNOSIS — Z9989 Dependence on other enabling machines and devices: Secondary | ICD-10-CM

## 2012-03-04 LAB — HEPARIN LEVEL (UNFRACTIONATED): Heparin Unfractionated: 0.54 IU/mL (ref 0.30–0.70)

## 2012-03-04 LAB — URINALYSIS, ROUTINE W REFLEX MICROSCOPIC
Bilirubin Urine: NEGATIVE
Ketones, ur: NEGATIVE mg/dL
Leukocytes, UA: NEGATIVE
Nitrite: NEGATIVE
Protein, ur: NEGATIVE mg/dL

## 2012-03-04 LAB — CBC
MCH: 32 pg (ref 26.0–34.0)
Platelets: 287 10*3/uL (ref 150–400)
RBC: 5.18 MIL/uL (ref 4.22–5.81)
RDW: 13.7 % (ref 11.5–15.5)

## 2012-03-04 LAB — GLUCOSE, CAPILLARY: Glucose-Capillary: 143 mg/dL — ABNORMAL HIGH (ref 70–99)

## 2012-03-04 LAB — BASIC METABOLIC PANEL
Calcium: 9.9 mg/dL (ref 8.4–10.5)
GFR calc non Af Amer: 62 mL/min — ABNORMAL LOW (ref >90)
Glucose, Bld: 125 mg/dL — ABNORMAL HIGH (ref 70–99)
Sodium: 127 mEq/L — ABNORMAL LOW (ref 135–145)

## 2012-03-04 LAB — CARBOXYHEMOGLOBIN: Carboxyhemoglobin: 1.5 % (ref 0.5–1.5)

## 2012-03-04 LAB — PROTIME-INR: INR: 1.25 (ref 0.00–1.49)

## 2012-03-04 MED ORDER — FUROSEMIDE 10 MG/ML IJ SOLN
80.0000 mg | Freq: Two times a day (BID) | INTRAMUSCULAR | Status: DC
  Administered 2012-03-04 – 2012-03-05 (×4): 80 mg via INTRAVENOUS
  Filled 2012-03-04 (×4): qty 8

## 2012-03-04 NOTE — Progress Notes (Signed)
Pt. States he can place cpap on at his convenience. RT will continue to monitor.

## 2012-03-04 NOTE — Progress Notes (Signed)
Subjective: Feels much better after diuresis.  Able to sleep in bed without significant orthopnea.  Decreased peripheral edema.  Objective: Vital signs in last 24 hours: Temp:  [97.4 F (36.3 C)-98.3 F (36.8 C)] 97.6 F (36.4 C) (12/28 0735) Pulse Rate:  [94-103] 103  (12/27 1545) Resp:  [18-19] 18  (12/28 0735) BP: (98-143)/(66-96) 108/66 mmHg (12/28 0800) SpO2:  [95 %-99 %] 99 % (12/28 0800) Weight:  [124.2 kg (273 lb 13 oz)] 124.2 kg (273 lb 13 oz) (12/28 0500) Weight change: -1.8 kg (-3 lb 15.5 oz) Last BM Date: 02/28/12  CBG (last 3)   Basename 03/04/12 0733 03/03/12 2128 03/03/12 1645  GLUCAP 143* 114* 106*    Intake/Output from previous day: 12/27 0701 - 12/28 0700 In: 966 [P.O.:400; I.V.:566] Out: 3175 [Urine:3175] Intake/Output this shift: Total I/O In: 32 [I.V.:24; IV Piggyback:8] Out: -   General appearance: alert and no distress Eyes: no scleral icterus; periorbital edema Throat: oropharynx moist without erythema Resp: minimal bibasilar crackles Cardio: irregularly irregular rhythm GI: soft, non-tender; bowel sounds normal; no masses,  no organomegaly Extremities: no clubbing, cyanosis or edema  Lab Results:  Basename 03/04/12 0430 03/03/12 0400  NA 127* 132*  K 4.7 4.6  CL 89* 92*  CO2 28 29  GLUCOSE 125* 119*  BUN 29* 27*  CREATININE 1.24 1.26  CALCIUM 9.9 9.6  MG -- --  PHOS -- --   No results found for this basename: AST:2,ALT:2,ALKPHOS:2,BILITOT:2,PROT:2,ALBUMIN:2 in the last 72 hours  Basename 03/04/12 0430 03/03/12 0400  WBC 8.9 8.4  NEUTROABS -- --  HGB 16.6 15.3  HCT 48.9 46.6  MCV 94.4 93.4  PLT 287 304   Lab Results  Component Value Date   INR 1.25 03/04/2012   INR 1.47 03/03/2012   INR 1.62* 03/02/2012   No results found for this basename: CKTOTAL:3,CKMB:3,CKMBINDEX:3,TROPONINI:3 in the last 72 hours No results found for this basename: TSH,T4TOTAL,FREET3,T3FREE,THYROIDAB in the last 72 hours No results found for this  basename: VITAMINB12:2,FOLATE:2,FERRITIN:2,TIBC:2,IRON:2,RETICCTPCT:2 in the last 72 hours  Studies/Results: No results found.   Medications: Scheduled:   . amiodarone  400 mg Oral BID  . aspirin EC  81 mg Oral Daily  . digoxin  0.25 mg Oral Daily  . enalapril  2.5 mg Oral BID  . furosemide  80 mg Intravenous BID  . insulin aspart  0-15 Units Subcutaneous TID WC  . metolazone  2.5 mg Oral Daily  . metoprolol succinate  25 mg Oral Daily  . potassium chloride  40 mEq Oral BID  . simvastatin  20 mg Oral q1800  . sodium chloride  3 mL Intravenous Q12H  . sodium chloride  3 mL Intravenous Q12H  . spironolactone  12.5 mg Oral Daily  . timolol  1 drop Both Eyes Daily   Continuous:   . heparin 1,900 Units/hr (03/03/12 2014)    Assessment/Plan: Active Problems: 1. DIABETES MELLITUS, UNCONTROLLED- well controlled on SSI.  Will transition to DPP-4 on discharge.  Check urinalysis for proteinuria. 2. Hyponatremia- slight decrease with diuresis.  Continue to monitor. 3. OSA on CPAP- continue CPAP. 4. Acute on chronic combined systolic and diastolic heart failure (EF 20-25%)- continues to diurese well.  Management per cardiology.  Anticipate cath on Monday. 5. ATRIAL FIBRILLATION WITH RAPID VENTRICULAR RESPONSE- continue rate control, Amiodarone and anticoagulation per Cardiology.    Greatly appreciate cardiology management.  We will continue to follow with you.     LOS: 5 days   Raymond Pratt,W [name] 03/04/2012, 9:31 AM

## 2012-03-04 NOTE — Progress Notes (Signed)
Advanced Heart Failure Rounding Note   Subjective:    Raymond Pratt is a 61 y.o. gentlemen with history of morbid obesity, chronic atrial fibrillation on coumadin (diagnosed in 2010 with failed DCCV), diastolic heart failure (per echo 2010), HTN and sleep apnea with CPAP. Also has history of DM (dx 10 years ago) and left eye surgery due to trauma from nail gun accident.  He has been admitted by Dr. Virgina Jock for progressive edema that has "spread to whole body." Pertinent labs include proBNP 1405, Cr 0.95, Albumin 3.3, INR 2.41. He is in rapid AF with HRs in 110-150 range. CXR with cardiomegaly and vascular congestion and bibasilar atelectasis. No ascites noted on abdominal u/s Wt 306 pounds on admit. He has been placed on IV lasix 80 mg BID. Repeat echo showed LVEF fallen 20-25% (nml 2010), diffuse hypokinesis. Trivial AI, mild MR, LA mildly dilated, RV mod dilated with mod reduced systolic function, PAPP 46 mmHg.   Diuresing briskly on IV lasix and daily Metolazone. Weight down 33 pounds so far. Ambulating unit. AF improved rate control on amio. Denies SOB/PND/Orthopnea. Eager to go home  CVP 10  (checked personally). Co-ox 62  Objective:   Weight Range:  Vital Signs:   Temp:  [97.4 F (36.3 C)-98.6 F (37 C)] 98 F (36.7 C) (12/28 0401) Pulse Rate:  [94-103] 103  (12/27 1545) Resp:  [17-19] 18  (12/27 1545) BP: (97-143)/(71-96) 98/73 mmHg (12/27 2350) SpO2:  [95 %-97 %] 97 % (12/28 0401) Weight:  [124.2 kg (273 lb 13 oz)] 124.2 kg (273 lb 13 oz) (12/28 0500) Last BM Date: 02/28/12  Weight change: Filed Weights   03/02/12 0500 03/03/12 0424 03/04/12 0500  Weight: 129.9 kg (286 lb 6 oz) 126 kg (277 lb 12.5 oz) 124.2 kg (273 lb 13 oz)    Intake/Output:   Intake/Output Summary (Last 24 hours) at 03/04/12 0725 Last data filed at 03/04/12 0500  Gross per 24 hour  Intake    928 ml  Output   3175 ml  Net  -2247 ml     Physical Exam: CVP 11 General: Obese, chronically-ill appearing.  No resp difficulty sitting in chair HEENT: normal  Neck: supple. Very thick. Unable to assess JVP . Carotids 2+ bilat; no bruits. No lymphadenopathy or thryomegaly appreciated.  Cor: PMI nonpalpable. Irregular distant heart sounds  Lungs: clear  Abdomen: nontender, obese, + distention. Good bowel sounds.  Extremities: no cyanosis, clubbing, rash, LLE 2+ > RLE 2+.Thigh edema nearly resolved. Neuro: alert & orientedx3, cranial nerves grossly intact. moves all 4 extremities w/o difficulty. Affect pleasant   Telemetry: afib 80-90  Labs: Basic Metabolic Panel:  Lab [id number] 0430 03/03/12 0400 03/02/12 0420 03/01/12 0445 02/29/12 0425 02/28/12 1614  NA 127* 132* 132* 136 137 --  K 4.7 4.6 4.2 4.0 3.3* --  CL 89* 92* 91* 94* 98 --  CO2 28 29 31 30 27  --  GLUCOSE 125* 119* 138* 230* 131* --  BUN 29* 27* 24* 21 21 --  CREATININE 1.24 1.26 1.12 1.01 1.02 --  CALCIUM 9.9 9.6 9.7 -- -- --  MG -- -- -- -- -- 2.2  PHOS -- -- -- -- -- 3.0    Liver Function Tests:  Lab 02/28/12 1614  AST 36  ALT 32  ALKPHOS 98  BILITOT 1.0  PROT 7.5  ALBUMIN 3.3*   No results found for this basename: LIPASE:5,AMYLASE:5 in the last 168 hours No results found for this basename: AMMONIA:3 in the last 168  hours  CBC:  Lab 03/04/12 0430 03/03/12 0400 03/02/12 0420 03/01/12 0445 02/29/12 0425  WBC 8.9 8.4 9.3 7.8 8.6  NEUTROABS -- -- -- -- --  HGB 16.6 15.3 16.2 14.7 14.8  HCT 48.9 46.6 47.5 44.3 45.6  MCV 94.4 93.4 93.5 95.3 97.0  PLT 287 304 249 282 291    Cardiac Enzymes:  Lab 02/28/12 1534  CKTOTAL --  CKMB --  CKMBINDEX --  TROPONINI <0.30    BNP: BNP (last 3 results)  Basename 02/28/12 1534  PROBNP 1405.0*     Other results:    Imaging: No results found.   Medications:     Scheduled Medications:    . amiodarone  400 mg Oral BID  . aspirin EC  81 mg Oral Daily  . digoxin  0.25 mg Oral Daily  . enalapril  2.5 mg Oral BID  . furosemide  80 mg Intravenous BID  .  insulin aspart  0-15 Units Subcutaneous TID WC  . metolazone  2.5 mg Oral Daily  . metoprolol succinate  25 mg Oral Daily  . potassium chloride  40 mEq Oral BID  . simvastatin  20 mg Oral q1800  . sodium chloride  3 mL Intravenous Q12H  . sodium chloride  3 mL Intravenous Q12H  . spironolactone  12.5 mg Oral Daily  . timolol  1 drop Both Eyes Daily    Infusions:    . heparin 1,900 Units/hr (03/03/12 2014)    PRN Medications: sodium chloride, acetaminophen, acetaminophen, sodium chloride, sodium chloride   Assessment:   1. Acute on chronic diastolic/systolic HF heart failure (EF 20-25%) 2. Permanent AF - now with RVR  - on coumadin  3. Morbid obesity/anasarca  4. Sleep apnea with CPAP  5. DM2  6. Chest pressure 7. Hyponatremia  Plan/Discussion:   Volume status continues to improve but still with a way to go. Overall weight down 33 pounds. Renal function stable but sodium dropping. If sodium < 123 will consider giving several doses of tolvaptan.   BP soft. Will not titrate HF medications at this point.  RHC/LLHC on Monday.  Heparin per pharmacy. Resume coumadin post cath.  INR goal 2-3  Continue CR.    Weylin Plagge,MD 7:25 AM

## 2012-03-04 NOTE — Progress Notes (Signed)
CARDIAC REHAB PHASE I   PRE:  Rate/Rhythm: 95afib  BP:  Supine:   Sitting: 137/96  Standing:    SaO2: 93%RA  MODE:  Ambulation: 1050 ft   POST:  Rate/Rhythem: 119afib  BP:  Supine:   Sitting: 136/93  Standing:    SaO2: 97%RA 1419-1450 Pt walked 1050 ft on RA with steady gait. Tolerated well. Said his breathing was better. Able to converse during walk. Reviewed signs of CHF and importance of daily weights.  Jeani Sow

## 2012-03-04 NOTE — Progress Notes (Signed)
ANTICOAGULATION CONSULT NOTE - Follow Up Consult  Pharmacy Consult for heparin (Coumadin on hold) Indication: atrial fibrillation  No Known Allergies  Patient Measurements: Height: 5\' 8"  (172.7 cm) Weight: 273 lb 13 oz (124.2 kg) (stand up scale) IBW/kg (Calculated) : 68.4    Vital Signs: Temp: 97.6 F (36.4 C) (12/28 0735) Temp src: Oral (12/28 0735) BP: 108/66 mmHg (12/28 0800)  Labs:  Basename 03/04/12 0430 03/03/12 0400 03/02/12 0420  HGB 16.6 15.3 --  HCT 48.9 46.6 47.5  PLT 287 304 249  APTT -- -- --  LABPROT 15.5* 17.4* 18.7*  INR 1.25 1.47 1.62*  HEPARINUNFRC 0.54 0.52 0.44  CREATININE 1.24 1.26 1.12  CKTOTAL -- -- --  CKMB -- -- --  TROPONINI -- -- --    Estimated Creatinine Clearance: 81.3 ml/min (by C-G formula based on Cr of 1.24).   Medications:  Scheduled:     . amiodarone  400 mg Oral BID  . aspirin EC  81 mg Oral Daily  . digoxin  0.25 mg Oral Daily  . enalapril  2.5 mg Oral BID  . furosemide  80 mg Intravenous BID  . insulin aspart  0-15 Units Subcutaneous TID WC  . metolazone  2.5 mg Oral Daily  . metoprolol succinate  25 mg Oral Daily  . potassium chloride  40 mEq Oral BID  . simvastatin  20 mg Oral q1800  . sodium chloride  3 mL Intravenous Q12H  . sodium chloride  3 mL Intravenous Q12H  . spironolactone  12.5 mg Oral Daily  . timolol  1 drop Both Eyes Daily  . [DISCONTINUED] furosemide  80 mg Intravenous BID    Assessment: 61 yo male on coumadin pta for afib ( PTA dose 5mg  daily except 7.5mg  on Wed and Sat ) Coumadin now on hold for [facility] on Monday. Pharmacy to manage heparin while INR < 2. Heparin level therapeutic at 0.54. CBC stable and no noted bleeding.  Goal of Therapy:  Heparin level 0.3-0.7 units/ml Monitor platelets by anticoagulation protocol: Yes   Plan:  1. Continue current heparin gtt 1900 units/hour 2. Will recheck a level in am 3. F/u plans after cath  Nevada Crane, Pharm D 03/04/2012 8:55 AM

## 2012-03-05 DIAGNOSIS — E871 Hypo-osmolality and hyponatremia: Secondary | ICD-10-CM

## 2012-03-05 LAB — CBC
MCV: 94.3 fL (ref 78.0–100.0)
Platelets: 314 10*3/uL (ref 150–400)
RBC: 5.22 MIL/uL (ref 4.22–5.81)
RDW: 13.6 % (ref 11.5–15.5)
WBC: 9.3 10*3/uL (ref 4.0–10.5)

## 2012-03-05 LAB — GLUCOSE, CAPILLARY
Glucose-Capillary: 117 mg/dL — ABNORMAL HIGH (ref 70–99)
Glucose-Capillary: 169 mg/dL — ABNORMAL HIGH (ref 70–99)
Glucose-Capillary: 111 mg/dL — ABNORMAL HIGH (ref 70–99)

## 2012-03-05 LAB — PROTIME-INR
INR: 1.27 (ref 0.00–1.49)
Prothrombin Time: 15.6 seconds — ABNORMAL HIGH (ref 11.6–15.2)

## 2012-03-05 LAB — CARBOXYHEMOGLOBIN
Carboxyhemoglobin: 1.5 % (ref 0.5–1.5)
O2 Saturation: 60.4 %
Total hemoglobin: 18.4 g/dL — ABNORMAL HIGH (ref 13.5–18.0)

## 2012-03-05 LAB — BASIC METABOLIC PANEL
CO2: 28 mEq/L (ref 19–32)
Calcium: 9.5 mg/dL (ref 8.4–10.5)
Chloride: 89 mEq/L — ABNORMAL LOW (ref 96–112)
Creatinine, Ser: 1.25 mg/dL (ref 0.50–1.35)
GFR calc Af Amer: 71 mL/min — ABNORMAL LOW (ref >90)
Sodium: 125 mEq/L — ABNORMAL LOW (ref 135–145)

## 2012-03-05 MED ORDER — ASPIRIN 81 MG PO CHEW
324.0000 mg | CHEWABLE_TABLET | ORAL | Status: AC
  Administered 2012-03-06: 324 mg via ORAL
  Filled 2012-03-05: qty 4

## 2012-03-05 MED ORDER — SODIUM CHLORIDE 0.9 % IV SOLN: 250.0000 mL | INTRAVENOUS | Status: DC | PRN

## 2012-03-05 MED ORDER — SODIUM CHLORIDE 0.9 % IJ SOLN: 3.0000 mL | INTRAMUSCULAR | Status: DC | PRN

## 2012-03-05 MED ORDER — LINAGLIPTIN 5 MG PO TABS
5.0000 mg | ORAL_TABLET | Freq: Every day | ORAL | Status: DC
  Administered 2012-03-05 – 2012-03-08 (×4): 5 mg via ORAL
  Filled 2012-03-05 (×4): qty 1

## 2012-03-05 MED ORDER — SODIUM CHLORIDE 0.9 % IJ SOLN: 3.0000 mL | Freq: Two times a day (BID) | INTRAMUSCULAR | Status: DC

## 2012-03-05 NOTE — Progress Notes (Signed)
ANTICOAGULATION CONSULT NOTE - Follow Up Consult  Pharmacy Consult for heparin (Coumadin on hold) Indication: atrial fibrillation  No Known Allergies  Patient Measurements: Height: 5\' 8"  (172.7 cm) Weight: 268 lb 15.4 oz (122 kg) IBW/kg (Calculated) : 68.4    Vital Signs: Temp: 98.3 F (36.8 C) (12/29 0809) Temp src: Oral (12/29 0809) BP: 115/78 mmHg (12/29 0419)  Labs:  Basename 03/05/12 0425 03/04/12 0430 03/03/12 0400  HGB 16.8 16.6 --  HCT 49.2 48.9 46.6  PLT 314 287 304  APTT -- -- --  LABPROT 15.6* 15.5* 17.4*  INR 1.27 1.25 1.47  HEPARINUNFRC 0.36 0.54 0.52  CREATININE 1.25 1.24 1.26  CKTOTAL -- -- --  CKMB -- -- --  TROPONINI -- -- --    Estimated Creatinine Clearance: 79.8 ml/min (by C-G formula based on Cr of 1.25).   Medications:  Scheduled:     . amiodarone  400 mg Oral BID  . aspirin EC  81 mg Oral Daily  . digoxin  0.25 mg Oral Daily  . enalapril  2.5 mg Oral BID  . furosemide  80 mg Intravenous BID  . insulin aspart  0-15 Units Subcutaneous TID WC  . linagliptin  5 mg Oral Daily  . metoprolol succinate  25 mg Oral Daily  . potassium chloride  40 mEq Oral BID  . simvastatin  20 mg Oral q1800  . sodium chloride  3 mL Intravenous Q12H  . sodium chloride  3 mL Intravenous Q12H  . spironolactone  12.5 mg Oral Daily  . [DISCONTINUED] metolazone  2.5 mg Oral Daily  . [DISCONTINUED] timolol  1 drop Both Eyes Daily    Assessment: 61 yo male on coumadin pta for afib ( PTA dose 5mg  daily except 7.5mg  on Wed and Sat ) Coumadin now on hold for [facility] on Monday. Pharmacy to manage heparin while INR < 2. Heparin level therapeutic at 0.36. CBC stable and no noted bleeding.  Goal of Therapy:  Heparin level 0.3-0.7 units/ml Monitor platelets by anticoagulation protocol: Yes   Plan:  1. Continue current heparin gtt 1900 units/hour 2. Will recheck a level in am 3. F/u plans after cath  Raymond Pratt D 03/05/2012 11:17 AM

## 2012-03-05 NOTE — Progress Notes (Addendum)
Advanced Heart Failure Rounding Note   Subjective:    Mr. Rosengrant is a 61 y.o. gentlemen with history of morbid obesity, chronic atrial fibrillation on coumadin (diagnosed in 2010 with failed DCCV), diastolic heart failure (per echo 2010), HTN and sleep apnea with CPAP. Also has history of DM (dx 10 years ago) and left eye surgery due to trauma from nail gun accident.  He has been admitted by Dr. Virgina Jock for progressive edema that has "spread to whole body." Pertinent labs include proBNP 1405, Cr 0.95, Albumin 3.3, INR 2.41. He is in rapid AF with HRs in 110-150 range. CXR with cardiomegaly and vascular congestion and bibasilar atelectasis. No ascites noted on abdominal u/s Wt 306 pounds on admit. He has been placed on IV lasix 80 mg BID. Repeat echo showed LVEF fallen 20-25% (nml 2010), diffuse hypokinesis. Trivial AI, mild MR, LA mildly dilated, RV mod dilated with mod reduced systolic function, PAPP 46 mmHg.   Continues to diurese briskly on IV lasix and daily Metolazone. Weight down 38 pounds so far. Ambulating unit. AF improved rate control on amio. Denies SOB/PND/Orthopnea. Sodium continues to fall. Cr stable  CVP 10  (checked personally). Co-ox 60  Objective:   Weight Range:  Vital Signs:   Temp:  [97.5 F (36.4 C)-98.7 F (37.1 C)] 98.3 F (36.8 C) (12/29 0809) Resp:  [15-20] 18  (12/29 0809) BP: (94-122)/(68-89) 115/78 mmHg (12/29 0419) SpO2:  [97 %-98 %] 98 % (12/29 0809) Weight:  [122 kg (268 lb 15.4 oz)] 122 kg (268 lb 15.4 oz) (12/29 0427) Last BM Date: 03/04/12  Weight change: Filed Weights   03/03/12 0424 03/04/12 0500 03/05/12 0427  Weight: 126 kg (277 lb 12.5 oz) 124.2 kg (273 lb 13 oz) 122 kg (268 lb 15.4 oz)    Intake/Output:   Intake/Output Summary (Last 24 hours) at 03/05/12 0916 Last data filed at 03/05/12 0700  Gross per 24 hour  Intake    776 ml  Output   3975 ml  Net  -3199 ml     Physical Exam: CVP 11 General: Obese, chronically-ill appearing. No  resp difficulty sitting in chair HEENT: normal  Neck: supple. Very thick. Unable to assess JVP . Carotids 2+ bilat; no bruits. No lymphadenopathy or thryomegaly appreciated.  Cor: PMI nonpalpable. Irregular distant heart sounds  Lungs: clear  Abdomen: nontender, obese, + distention. Good bowel sounds.  Extremities: no cyanosis, clubbing, rash, LLE 1+ > RLE 1+.Thigh edema resolved. Neuro: alert & orientedx3, cranial nerves grossly intact. moves all 4 extremities w/o difficulty. Affect pleasant   Telemetry: afib 80-90  Labs: Basic Metabolic Panel:  Lab [id number] 03/04/12 0430 03/03/12 0400 03/02/12 0420 03/01/12 0445 02/28/12 1614  NA 125* 127* 132* 132* 136 --  K 4.5 4.7 4.6 4.2 4.0 --  CL 89* 89* 92* 91* 94* --  CO2 28 28 29 31 30  --  GLUCOSE 120* 125* 119* 138* 230* --  BUN 35* 29* 27* 24* 21 --  CREATININE 1.25 1.24 1.26 1.12 1.01 --  CALCIUM 9.5 9.9 9.6 -- -- --  MG -- -- -- -- -- 2.2  PHOS -- -- -- -- -- 3.0    Liver Function Tests:  Lab 02/28/12 1614  AST 36  ALT 32  ALKPHOS 98  BILITOT 1.0  PROT 7.5  ALBUMIN 3.3*   No results found for this basename: LIPASE:5,AMYLASE:5 in the last 168 hours No results found for this basename: AMMONIA:3 in the last 168 hours  CBC:  Lab  03/05/12 0425 03/04/12 0430 03/03/12 0400 03/02/12 0420 03/01/12 0445  WBC 9.3 8.9 8.4 9.3 7.8  NEUTROABS -- -- -- -- --  HGB 16.8 16.6 15.3 16.2 14.7  HCT 49.2 48.9 46.6 47.5 44.3  MCV 94.3 94.4 93.4 93.5 95.3  PLT 314 287 304 249 282    Cardiac Enzymes:  Lab 02/28/12 1534  CKTOTAL --  CKMB --  CKMBINDEX --  TROPONINI <0.30    BNP: BNP (last 3 results)  Basename 02/28/12 1534  PROBNP 1405.0*     Other results:    Imaging: No results found.   Medications:     Scheduled Medications:    . amiodarone  400 mg Oral BID  . aspirin EC  81 mg Oral Daily  . digoxin  0.25 mg Oral Daily  . enalapril  2.5 mg Oral BID  . furosemide  80 mg Intravenous BID  . insulin  aspart  0-15 Units Subcutaneous TID WC  . metolazone  2.5 mg Oral Daily  . metoprolol succinate  25 mg Oral Daily  . potassium chloride  40 mEq Oral BID  . simvastatin  20 mg Oral q1800  . sodium chloride  3 mL Intravenous Q12H  . sodium chloride  3 mL Intravenous Q12H  . spironolactone  12.5 mg Oral Daily  . timolol  1 drop Both Eyes Daily    Infusions:    . heparin 1,900 Units/hr (03/05/12 0217)    PRN Medications: sodium chloride, acetaminophen, acetaminophen, sodium chloride, sodium chloride   Assessment:   1. Acute on chronic diastolic/systolic HF heart failure (EF 20-25%) 2. Permanent AF - now with RVR  - on coumadin  3. Morbid obesity/anasarca  4. Sleep apnea with CPAP  5. DM2  6. Chest pressure 7. Hyponatremia  Plan/Discussion:   Volume status continues to improve but still with some on board. Overall weight down 39 pounds. Renal function stable but sodium dropping. If sodium < 124 will consider giving several doses of tolvaptan. Stop metolazone today.   BP soft. Will not titrate HF medications at this point.  RHC/LLHC tomorrow.  Heparin per pharmacy. Resume coumadin post cath.  INR goal 2-3  Continue CR.    Christyn Gutkowski,MD 9:16 AM

## 2012-03-05 NOTE — Progress Notes (Signed)
Pt. Stated that he would place himself on CPAP when ready for bed. Sterile water was placed in CPAP by RT.

## 2012-03-05 NOTE — Progress Notes (Signed)
Pt. States that he will place himself on CPAP when ready for bed.

## 2012-03-05 NOTE — Progress Notes (Signed)
Subjective: Feels better.  Continues to diurese well.  Down another 4 lbs.  No lightheadedness or dizziness.  Less swelling.  Shortness of breath improved.  Objective: Vital signs in last 24 hours: Temp:  [97.5 F (36.4 C)-98.7 F (37.1 C)] 98.3 F (36.8 C) (12/29 0809) Resp:  [15-20] 18  (12/29 0809) BP: (94-122)/(68-89) 115/78 mmHg (12/29 0419) SpO2:  [97 %-98 %] 98 % (12/29 0809) Weight:  [122 kg (268 lb 15.4 oz)] 122 kg (268 lb 15.4 oz) (12/29 0427) Weight change: -2.2 kg (-4 lb 13.6 oz) Last BM Date: 03/04/12  CBG (last 3)   Basename 03/05/12 0808 03/04/12 2147 03/04/12 1705  GLUCAP 117* 142* 104*    Intake/Output from previous day: 12/28 0701 - 12/29 0700 In: [id number] [P.O.:240; I.V.:576; IV Piggyback:16] Out: 3975 [Urine:3975] Intake/Output this shift: Total I/O In: -  Out: 900 [Urine:900]  General appearance: alert and obese Eyes: no scleral icterus Throat: oropharynx moist without erythema Resp: clear to auscultation bilaterally Cardio: irregularly irregular rhythm GI: soft, non-tender; bowel sounds normal; no masses,  no organomegaly Extremities: no clubbing, cyanosis; decreased edema, thickened skin over thighs with less edema  Lab Results:  Basename 03/05/12 0425 03/04/12 0430  NA 125* 127*  K 4.5 4.7  CL 89* 89*  CO2 28 28  GLUCOSE 120* 125*  BUN 35* 29*  CREATININE 1.25 1.24  CALCIUM 9.5 9.9  MG -- --  PHOS -- --   No results found for this basename: AST:2,ALT:2,ALKPHOS:2,BILITOT:2,PROT:2,ALBUMIN:2 in the last 72 hours  Basename 03/05/12 0425 03/04/12 0430  WBC 9.3 8.9  NEUTROABS -- --  HGB 16.8 16.6  HCT 49.2 48.9  MCV 94.3 94.4  PLT 314 287   Lab Results  Component Value Date   INR 1.27 03/05/2012   INR 1.25 03/04/2012   INR 1.47 03/03/2012   Urinalysis negative- no proteinuria  Studies/Results: No results found.   Medications: Scheduled:   . amiodarone  400 mg Oral BID  . aspirin EC  81 mg Oral Daily  . digoxin  0.25 mg Oral  Daily  . enalapril  2.5 mg Oral BID  . furosemide  80 mg Intravenous BID  . insulin aspart  0-15 Units Subcutaneous TID WC  . metoprolol succinate  25 mg Oral Daily  . potassium chloride  40 mEq Oral BID  . simvastatin  20 mg Oral q1800  . sodium chloride  3 mL Intravenous Q12H  . sodium chloride  3 mL Intravenous Q12H  . spironolactone  12.5 mg Oral Daily  . timolol  1 drop Both Eyes Daily   Continuous:   . heparin 1,900 Units/hr (03/05/12 0217)    Assessment/Plan: Active Problems:  1. DIABETES MELLITUS, UNCONTROLLED- well controlled on SSI. Will resume DPP-4 in anticipation of possible discharge after cath (Tradjenta substituted for Januvia based on formulary in hospital).  2. Hyponatremia- continued decline with diuresis. Continue to monitor. Stopping Zaroxylyn as recommended by Cardiology may help.   3. OSA on CPAP- continue CPAP.  4. Acute on chronic combined systolic and diastolic heart failure (EF 20-25%)- continues to diurese well. Management per cardiology. Anticipate cath on Monday.  5. ATRIAL FIBRILLATION WITH RAPID VENTRICULAR RESPONSE- continue rate control, Amiodarone and anticoagulation per Cardiology.  Greatly appreciate cardiology management. We will continue to follow with you.     LOS: 6 days   Kolbee Stallman,W [name] 03/05/2012, 10:53 AM

## 2012-03-06 ENCOUNTER — Encounter (HOSPITAL_COMMUNITY)
Admission: AD | Disposition: A | Discharge status: Home / Self Care | Payer: Self-pay | Source: Ambulatory Visit | Attending: Internal Medicine

## 2012-03-06 DIAGNOSIS — I2510 Atherosclerotic heart disease of native coronary artery without angina pectoris: Secondary | ICD-10-CM

## 2012-03-06 HISTORY — PX: LEFT AND RIGHT HEART CATHETERIZATION WITH CORONARY ANGIOGRAM: SHX5449

## 2012-03-06 LAB — CARBOXYHEMOGLOBIN: Carboxyhemoglobin: 1.6 % — ABNORMAL HIGH (ref 0.5–1.5)

## 2012-03-06 LAB — GLUCOSE, CAPILLARY
Glucose-Capillary: 114 mg/dL — ABNORMAL HIGH (ref 70–99)
Glucose-Capillary: 55 mg/dL — ABNORMAL LOW (ref 70–99)
Glucose-Capillary: 108 mg/dL — ABNORMAL HIGH (ref 70–99)

## 2012-03-06 LAB — BASIC METABOLIC PANEL
BUN: 39 mg/dL — ABNORMAL HIGH (ref 6–23)
CO2: 27 mEq/L (ref 19–32)
Calcium: 9.8 mg/dL (ref 8.4–10.5)
Creatinine, Ser: 1.29 mg/dL (ref 0.50–1.35)
Glucose, Bld: 123 mg/dL — ABNORMAL HIGH (ref 70–99)

## 2012-03-06 LAB — CBC
MCH: 32 pg (ref 26.0–34.0)
MCHC: 34.1 g/dL (ref 30.0–36.0)
MCV: 93.8 fL (ref 78.0–100.0)
Platelets: 317 10*3/uL (ref 150–400)
RBC: 5.34 MIL/uL (ref 4.22–5.81)
RDW: 13.6 % (ref 11.5–15.5)

## 2012-03-06 LAB — POCT I-STAT 3, ART BLOOD GAS (G3+): O2 Saturation: 91 %

## 2012-03-06 SURGERY — LEFT AND RIGHT HEART CATHETERIZATION WITH CORONARY ANGIOGRAM
Anesthesia: LOCAL

## 2012-03-06 MED ORDER — WARFARIN - PHARMACIST DOSING INPATIENT
Freq: Every day | Status: DC
  Administered 2012-03-07: 18:00:00

## 2012-03-06 MED ORDER — SODIUM CHLORIDE 0.9 % IV SOLN
250.0000 mL | INTRAVENOUS | Status: DC
Start: 2012-03-07 — End: 2012-03-07

## 2012-03-06 MED ORDER — SODIUM CHLORIDE 0.9 % IJ SOLN: 3.0000 mL | Freq: Two times a day (BID) | INTRAMUSCULAR | Status: DC

## 2012-03-06 MED ORDER — NITROGLYCERIN 0.2 MG/ML ON CALL CATH LAB
INTRAVENOUS | Status: AC
  Filled 2012-03-06: qty 1

## 2012-03-06 MED ORDER — POTASSIUM CHLORIDE CRYS ER 20 MEQ PO TBCR: 40.0000 meq | EXTENDED_RELEASE_TABLET | Freq: Every day | ORAL | Status: DC

## 2012-03-06 MED ORDER — LIDOCAINE HCL (PF) 1 % IJ SOLN
INTRAMUSCULAR | Status: AC
  Filled 2012-03-06: qty 30

## 2012-03-06 MED ORDER — ACETAMINOPHEN 325 MG PO TABS
650.0000 mg | ORAL_TABLET | ORAL | Status: DC | PRN
  Administered 2012-03-06 – 2012-03-07 (×2): 650 mg via ORAL
  Filled 2012-03-06 (×2): qty 2

## 2012-03-06 MED ORDER — MIDAZOLAM HCL 2 MG/2ML IJ SOLN
INTRAMUSCULAR | Status: AC
  Filled 2012-03-06: qty 2

## 2012-03-06 MED ORDER — HEPARIN (PORCINE) IN NACL 2-0.9 UNIT/ML-% IJ SOLN
INTRAMUSCULAR | Status: AC
  Filled 2012-03-06: qty 1000

## 2012-03-06 MED ORDER — FUROSEMIDE 10 MG/ML IJ SOLN
80.0000 mg | Freq: Two times a day (BID) | INTRAMUSCULAR | Status: AC
  Administered 2012-03-06 – 2012-03-07 (×3): 80 mg via INTRAVENOUS
  Filled 2012-03-06 (×4): qty 8

## 2012-03-06 MED ORDER — SODIUM CHLORIDE 0.9 % IJ SOLN
3.0000 mL | INTRAMUSCULAR | Status: DC | PRN
  Administered 2012-03-06 (×2): 3 mL via INTRAVENOUS

## 2012-03-06 MED ORDER — WARFARIN SODIUM 7.5 MG PO TABS
7.5000 mg | ORAL_TABLET | Freq: Once | ORAL | Status: AC
  Administered 2012-03-06: 7.5 mg via ORAL
  Filled 2012-03-06: qty 1

## 2012-03-06 MED ORDER — POTASSIUM CHLORIDE CRYS ER 20 MEQ PO TBCR
40.0000 meq | EXTENDED_RELEASE_TABLET | Freq: Every day | ORAL | Status: DC
  Filled 2012-03-06: qty 2

## 2012-03-06 MED ORDER — ONDANSETRON HCL 4 MG/2ML IJ SOLN: 4.0000 mg | Freq: Four times a day (QID) | INTRAMUSCULAR | Status: DC | PRN

## 2012-03-06 MED ORDER — METOPROLOL SUCCINATE ER 50 MG PO TB24
75.0000 mg | ORAL_TABLET | Freq: Every day | ORAL | Status: DC
  Administered 2012-03-06 – 2012-03-08 (×3): 75 mg via ORAL
  Filled 2012-03-06 (×3): qty 1

## 2012-03-06 MED ORDER — FENTANYL CITRATE 0.05 MG/ML IJ SOLN
INTRAMUSCULAR | Status: AC
  Filled 2012-03-06: qty 2

## 2012-03-06 NOTE — Progress Notes (Signed)
Advanced Heart Failure Rounding Note   Subjective:    Raymond Pratt is a 61 y.o. gentlemen with history of morbid obesity, chronic atrial fibrillation on coumadin (diagnosed in 2010 with failed DCCV), diastolic heart failure (per echo 2010), HTN and sleep apnea with CPAP. Also has history of DM (dx 10 years ago) and left eye surgery due to trauma from nail gun accident.  He has been admitted by Dr. Virgina Jock for progressive edema that has "spread to whole body." Pertinent labs include proBNP 1405, Cr 0.95, Albumin 3.3, INR 2.41. He is in rapid AF with HRs in 110-150 range. CXR with cardiomegaly and vascular congestion and bibasilar atelectasis. No ascites noted on abdominal u/s Wt 306 pounds on admit. He has been placed on IV lasix 80 mg BID. Repeat echo showed LVEF fallen 20-25% (nml 2010), diffuse hypokinesis. Trivial AI, mild MR, LA mildly dilated, RV mod dilated with mod reduced systolic function, PAPP 46 mmHg.   Continues to diurese briskly on IV lasix and daily Metolazone. Weight down 42 pounds so far. Ambulating unit. AF improved rate control on amio. Denies SOB/PND/Orthopnea. Sodium stable. Cr stable   Co-ox 84  Objective:   Weight Range:  Vital Signs:   Temp:  [98.2 F (36.8 C)-98.7 F (37.1 C)] 98.3 F (36.8 C) (12/30 0400) Pulse Rate:  [81-93] 93  (12/29 1813) Resp:  [18-20] 20  (12/29 2200) BP: (103-139)/(62-87) 112/62 mmHg (12/30 0400) SpO2:  [96 %-98 %] 96 % (12/30 0400) Weight:  [265 lb 10.5 oz (120.5 kg)] 265 lb 10.5 oz (120.5 kg) (12/30 0502) Last BM Date: 03/05/12  Weight change: Filed Weights   03/04/12 0500 03/05/12 0427 03/06/12 0502  Weight: 273 lb 13 oz (124.2 kg) 268 lb 15.4 oz (122 kg) 265 lb 10.5 oz (120.5 kg)    Intake/Output:   Intake/Output Summary (Last 24 hours) at 03/06/12 0749 Last data filed at 03/06/12 [id number]  Gross per 24 hour  Intake   1284 ml  Output   4700 ml  Net  -3416 ml     Physical Exam: CVP 11 General: Obese, chronically-ill  appearing. No resp difficulty sitting in chair HEENT: normal  Neck: supple. Very thick. Unable to assess JVP . Carotids 2+ bilat; no bruits. No lymphadenopathy or thryomegaly appreciated.  Cor: PMI nonpalpable. Irregular distant heart sounds  Lungs: clear  Abdomen: nontender, obese, + distention. Good bowel sounds.  Extremities: no cyanosis, clubbing, rash, trace ankle edema Neuro: alert & orientedx3, cranial nerves grossly intact. moves all 4 extremities w/o difficulty. Affect pleasant   Telemetry: afib 80-90  Labs: Basic Metabolic Panel:  Lab [id number] 03/05/12 0425 03/04/12 0430 03/03/12 0400 03/02/12 0420 02/28/12 1614  NA 128* 125* 127* 132* 132* --  K 5.0 4.5 4.7 4.6 4.2 --  CL 90* 89* 89* 92* 91* --  CO2 27 28 28 29 31  --  GLUCOSE 123* 120* 125* 119* 138* --  BUN 39* 35* 29* 27* 24* --  CREATININE 1.29 1.25 1.24 1.26 1.12 --  CALCIUM 9.8 9.5 9.9 -- -- --  MG -- -- -- -- -- 2.2  PHOS -- -- -- -- -- 3.0    Liver Function Tests:  Lab 02/28/12 1614  AST 36  ALT 32  ALKPHOS 98  BILITOT 1.0  PROT 7.5  ALBUMIN 3.3*   No results found for this basename: LIPASE:5,AMYLASE:5 in the last 168 hours No results found for this basename: AMMONIA:3 in the last 168 hours  CBC:  Lab 03/06/12 0443 03/05/12  0425 03/04/12 0430 03/03/12 0400 03/02/12 0420  WBC 10.2 9.3 8.9 8.4 9.3  NEUTROABS -- -- -- -- --  HGB 17.1* 16.8 16.6 15.3 16.2  HCT 50.1 49.2 48.9 46.6 47.5  MCV 93.8 94.3 94.4 93.4 93.5  PLT 317 314 287 304 249    Cardiac Enzymes:  Lab 02/28/12 1534  CKTOTAL --  CKMB --  CKMBINDEX --  TROPONINI <0.30    BNP: BNP (last 3 results)  Basename 02/28/12 1534  PROBNP 1405.0*     Other results:    Imaging: No results found.   Medications:     Scheduled Medications:    . amiodarone  400 mg Oral BID  . aspirin EC  81 mg Oral Daily  . digoxin  0.25 mg Oral Daily  . enalapril  2.5 mg Oral BID  . furosemide  80 mg Intravenous BID  . insulin  aspart  0-15 Units Subcutaneous TID WC  . linagliptin  5 mg Oral Daily  . metoprolol succinate  25 mg Oral Daily  . potassium chloride  40 mEq Oral BID  . simvastatin  20 mg Oral q1800  . sodium chloride  3 mL Intravenous Q12H  . sodium chloride  3 mL Intravenous Q12H  . sodium chloride  3 mL Intravenous Q12H  . spironolactone  12.5 mg Oral Daily    Infusions:    . heparin 1,700 Units/hr (03/06/12 0700)    PRN Medications: sodium chloride, sodium chloride, acetaminophen, acetaminophen, sodium chloride, sodium chloride, sodium chloride   Assessment:   1. Acute on chronic diastolic/systolic HF heart failure (EF 20-25%) 2. Permanent AF - now with RVR  - on coumadin  3. Morbid obesity/anasarca  4. Sleep apnea with CPAP  5. DM2  6. Chest pressure 7. Hyponatremia  Plan/Discussion:   Volume status continues to improve but still with some on board. Overall weight down 42 pounds. Renal function stable but sodium dropping.   RHC/LHC today.   Heparin per pharmacy. Resume coumadin post cath.  INR goal 2-3  Continue cardiac rehab.   CLEGG,AMY NP-C 7:49 AM  Patient seen with NP, agree with note.  Volume difficult to ascertain but weight down considerably.  CVP not set up.  Will hold am Lasix today and hold K today.  LHC/RHC this afternoon.  Hopefully switch over to po diuretics based on cath numbers.  Stop amiodarone today and increase Toprol XL to 75 mg daily.  Continue other meds.   [name] 03/06/2012 8:04 AM

## 2012-03-06 NOTE — Progress Notes (Signed)
ANTICOAGULATION CONSULT NOTE - Follow Up Consult  Pharmacy Consult for heparin (Coumadin on hold) Indication: atrial fibrillation  No Known Allergies  Patient Measurements: Height: 5\' 8"  (172.7 cm) Weight: 265 lb 10.5 oz (120.5 kg) IBW/kg (Calculated) : 68.4    Vital Signs: Temp: 98.3 F (36.8 C) (12/30 0400) Temp src: Oral (12/30 0400) BP: 112/62 mmHg (12/30 0400)  Labs:  Basename 03/06/12 0443 03/05/12 0425 03/04/12 0430  HGB 17.1* 16.8 --  HCT 50.1 49.2 48.9  PLT 317 314 287  APTT -- -- --  LABPROT 15.9* 15.6* 15.5*  INR 1.30 1.27 1.25  HEPARINUNFRC 0.79* 0.36 0.54  CREATININE 1.29 1.25 1.24  CKTOTAL -- -- --  CKMB -- -- --  TROPONINI -- -- --    Estimated Creatinine Clearance: 76.8 ml/min (by C-G formula based on Cr of 1.29).  Assessment: 61 yo male with h/o  Afib, Coumadin now on hold for cath, for heparin.  Goal of Therapy:  Heparin level 0.3-0.7 units/ml Monitor platelets by anticoagulation protocol: Yes   Plan:  Decrease Heparin 1700 units/hr F/U after cath  Phillis Knack, PharmD, BCPS  03/06/2012 6:36 AM

## 2012-03-06 NOTE — Progress Notes (Signed)
ANTICOAGULATION CONSULT NOTE - Follow Up Consult  Pharmacy Consult for coumadin Indication: atrial fibrillation  No Known Allergies  Patient Measurements: Height: 5\' 8"  (172.7 cm) Weight: 265 lb 10.5 oz (120.5 kg) IBW/kg (Calculated) : 68.4    Vital Signs: Temp: 97.9 F (36.6 C) (12/30 1158) Temp src: Oral (12/30 1158) BP: 115/69 mmHg (12/30 1158) Pulse Rate: 78  (12/30 1158)  Labs:  Basename 03/06/12 0443 03/05/12 0425 03/04/12 0430  HGB 17.1* 16.8 --  HCT 50.1 49.2 48.9  PLT 317 314 287  APTT -- -- --  LABPROT 15.9* 15.6* 15.5*  INR 1.30 1.27 1.25  HEPARINUNFRC 0.79* 0.36 0.54  CREATININE 1.29 1.25 1.24  CKTOTAL -- -- --  CKMB -- -- --  TROPONINI -- -- --    Estimated Creatinine Clearance: 76.8 ml/min (by C-G formula based on Cr of 1.29).  Assessment: 61 yo male with hx of afib on chronic coumadin. Previously coumadin has been on hold for cardiac cath. In the meantime he had been on heparin. Now heparin has been discontinued post-cath and coumadin will be restarted.   Goal of Therapy:  INR 2-3   Plan:  1. Coumadin 7.5mg  PO x 1 today 2. Daily INR has already been ordered 3. F/u AM INR  Salome Arnt, PharmD, BCPS Pager # [phone] 03/06/2012 5:43 PM

## 2012-03-06 NOTE — CV Procedure (Signed)
   Cardiac Catheterization Procedure Note  Name: Raymond Pratt MRN: [id number] DOB: 10/01/1950  Procedure: Right Heart Cath, Left Heart Cath, Selective Coronary Angiography  Indication:    Procedural Details: The right groin was prepped, draped, and anesthetized with 1% lidocaine. Using the modified Seldinger technique a 5 French sheath was placed in the right femoral artery and a 7 French sheath was placed in the right femoral vein. A Swan-Ganz catheter was used for the right heart catheterization. Standard protocol was followed for recording of right heart pressures and sampling of oxygen saturations. Fick cardiac output was calculated. Standard Judkins catheters were used for selective coronary angiography.  LV-gram was not done due to elevated creatinine. There were no immediate procedural complications. The patient was transferred to the post catheterization recovery area for further monitoring.  Procedural Findings: Hemodynamics (mmHg) RA mean 11 RV 44/8 PA 46/22, mean 32 PCWP mean 20 LV 140/12 AO 128/73  Oxygen saturations: PA 60% AO 91%  Cardiac Output (Fick) 4.26 Cardiac Index (Fick) 1.84   Coronary angiography: Coronary dominance: right  Left mainstem: No significant disease.   Left anterior descending (LAD): Luminal irregularities.  Left circumflex (LCx): Large system, no significant disease.   Right coronary artery (RCA): 30% proximal RCA stenosis.   Left ventriculography: Not done due to elevated creatinine.   Final Conclusions:  Nonobstructive CAD.  Mildly elevated LV filling pressure.  Low cardiac index.   Recommendations: Continue IV Lasix for now.  Will need to follow creatinine closely.    [name] 03/06/2012, 3:56 PM

## 2012-03-06 NOTE — Progress Notes (Signed)
CARDIAC REHAB PHASE I   PRE:  Rate/Rhythm: 80 afib    BP: sitting 114/78    SaO2:   MODE:  Ambulation: 1400 ft   POST:  Rate/Rhythm: 130 afib    BP: sitting 115/91     SaO2:   Tolerated well. Began to get SOB on last lap while talking. HR higher, 120-130 afib. Asked pt to read HF booklet and we will discuss tomorrow after cath. Pt sts he watches his diet in general.  [id number]  Darrick Meigs CES, ACSM

## 2012-03-07 LAB — POCT I-STAT 3, VENOUS BLOOD GAS (G3P V)
pCO2, Ven: 47.1 mmHg (ref 45.0–50.0)
pH, Ven: 7.385 — ABNORMAL HIGH (ref 7.250–7.300)

## 2012-03-07 LAB — BASIC METABOLIC PANEL
Calcium: 9.8 mg/dL (ref 8.4–10.5)
GFR calc Af Amer: 71 mL/min — ABNORMAL LOW (ref >90)
GFR calc non Af Amer: 62 mL/min — ABNORMAL LOW (ref >90)
Potassium: 4.8 mEq/L (ref 3.5–5.1)
Sodium: 126 mEq/L — ABNORMAL LOW (ref 135–145)

## 2012-03-07 LAB — PROTIME-INR
INR: 1.25 (ref 0.00–1.49)
Prothrombin Time: 15.5 seconds — ABNORMAL HIGH (ref 11.6–15.2)

## 2012-03-07 LAB — GLUCOSE, CAPILLARY: Glucose-Capillary: 154 mg/dL — ABNORMAL HIGH (ref 70–99)

## 2012-03-07 LAB — CBC
Hemoglobin: 17.8 g/dL — ABNORMAL HIGH (ref 13.0–17.0)
MCH: 32.2 pg (ref 26.0–34.0)
Platelets: 272 10*3/uL (ref 150–400)
RBC: 5.52 MIL/uL (ref 4.22–5.81)

## 2012-03-07 MED ORDER — TORSEMIDE 20 MG PO TABS
40.0000 mg | ORAL_TABLET | Freq: Every day | ORAL | Status: DC
  Filled 2012-03-07: qty 2

## 2012-03-07 MED ORDER — TORSEMIDE 20 MG PO TABS: 40.0000 mg | ORAL_TABLET | Freq: Every day | ORAL | Status: DC

## 2012-03-07 MED ORDER — WARFARIN SODIUM 10 MG PO TABS
10.0000 mg | ORAL_TABLET | Freq: Once | ORAL | Status: AC
  Administered 2012-03-07: 18:00:00 10 mg via ORAL
  Filled 2012-03-07: qty 1

## 2012-03-07 NOTE — Progress Notes (Signed)
Advanced Heart Failure Rounding Note   Subjective:    Raymond Pratt is a 61 y.o. gentlemen with history of morbid obesity, chronic atrial fibrillation on coumadin (diagnosed in 2010 with failed DCCV), diastolic heart failure (per echo 2010), HTN and sleep apnea with CPAP. Also has history of DM (dx 10 years ago) and left eye surgery due to trauma from nail gun accident.  He has been admitted by Dr. Virgina Jock for progressive edema that has "spread to whole body." Pertinent labs include proBNP 1405, Cr 0.95, Albumin 3.3, INR 2.41. He is in rapid AF with HRs in 110-150 range. CXR with cardiomegaly and vascular congestion and bibasilar atelectasis. No ascites noted on abdominal u/s Wt 306 pounds on admit. He has been placed on IV lasix 80 mg BID. Repeat echo showed LVEF fallen 20-25% (nml 2010), diffuse hypokinesis. Trivial AI, mild MR, LA mildly dilated, RV mod dilated with mod reduced systolic function, PAPP 46 mmHg.   Continues to diurese briskly on IV lasix and daily Metolazone. Weight down 44 pounds so far. Ambulating unit. Yesterday amiodarone stopped and Toprol Xl 75 mg started.   03/06/12 LHC/RHC  RA mean 11  RV 44/8  PA 46/22, mean 32  PCWP mean 20  LV 140/12  AO 128/73  Oxygen saturations:  PA 60%  AO 91%  Cardiac Output (Fick) 4.26  Cardiac Index (Fick) 1.84   Otherwise normal - Right coronary artery (RCA): 30% proximal RCA stenosis   Denies  SOB/PND/orthopnea.     Objective:   Weight Range:  Vital Signs:   Temp:  [97.5 F (36.4 C)-98.2 F (36.8 C)] 98.2 F (36.8 C) (12/31 0440) Pulse Rate:  [47-108] 75  (12/31 0440) Resp:  [15-48] 15  (12/31 0440) BP: (96-127)/(51-93) 96/51 mmHg (12/31 0440) SpO2:  [91 %-98 %] 96 % (12/31 0440) Weight:  [263 lb 14.3 oz (119.7 kg)] 263 lb 14.3 oz (119.7 kg) (12/31 0440) Last BM Date: 03/05/12  Weight change: Filed Weights   03/05/12 0427 03/06/12 0502 03/07/12 0440  Weight: 268 lb 15.4 oz (122 kg) 265 lb 10.5 oz (120.5 kg) 263 lb  14.3 oz (119.7 kg)    Intake/Output:   Intake/Output Summary (Last 24 hours) at 03/07/12 0819 Last data filed at 03/07/12 0000  Gross per 24 hour  Intake 711.27 ml  Output   2375 ml  Net -1663.73 ml     Physical Exam:  General: Obese, chronically-ill appearing. No resp difficulty sitting in chair HEENT: normal  Neck: supple. Very thick. Unable to assess JVP . Carotids 2+ bilat; no bruits. No lymphadenopathy or thryomegaly appreciated.  Cor: PMI nonpalpable. Irregular distant heart sounds  Lungs: clear  Abdomen: nontender, obese, + distention. Good bowel sounds.  Extremities: no cyanosis, clubbing, rash, trace ankle edema Neuro: alert & orientedx3, cranial nerves grossly intact. moves all 4 extremities w/o difficulty. Affect pleasant   Telemetry: 90s  Labs: Basic Metabolic Panel:  Lab [id number] 03/06/12 0443 03/05/12 0425 03/04/12 0430 03/03/12 0400  NA 126* 128* 125* 127* 132*  K 4.8 5.0 4.5 4.7 4.6  CL 90* 90* 89* 89* 92*  CO2 25 27 28 28 29   GLUCOSE 129* 123* 120* 125* 119*  BUN 36* 39* 35* 29* 27*  CREATININE 1.24 1.29 1.25 1.24 1.26  CALCIUM 9.8 9.8 9.5 -- --  MG -- -- -- -- --  PHOS -- -- -- -- --    Liver Function Tests: No results found for this basename: AST:5,ALT:5,ALKPHOS:5,BILITOT:5,PROT:5,ALBUMIN:5 in the last 168 hours No  results found for this basename: LIPASE:5,AMYLASE:5 in the last 168 hours No results found for this basename: AMMONIA:3 in the last 168 hours  CBC:  Lab 03/07/12 0602 03/06/12 0443 03/05/12 0425 03/04/12 0430 03/03/12 0400  WBC 11.0* 10.2 9.3 8.9 8.4  NEUTROABS -- -- -- -- --  HGB 17.8* 17.1* 16.8 16.6 15.3  HCT 51.7 50.1 49.2 48.9 46.6  MCV 93.7 93.8 94.3 94.4 93.4  PLT 272 317 314 287 304    Cardiac Enzymes: No results found for this basename: CKTOTAL:5,CKMB:5,CKMBINDEX:5,TROPONINI:5 in the last 168 hours  BNP: BNP (last 3 results)  Basename 02/28/12 1534  PROBNP 1405.0*     Other results:    Imaging: No  results found.   Medications:     Scheduled Medications:    . aspirin EC  81 mg Oral Daily  . digoxin  0.25 mg Oral Daily  . enalapril  2.5 mg Oral BID  . furosemide  80 mg Intravenous BID  . insulin aspart  0-15 Units Subcutaneous TID WC  . linagliptin  5 mg Oral Daily  . metoprolol succinate  75 mg Oral Daily  . potassium chloride  40 mEq Oral Daily  . simvastatin  20 mg Oral q1800  . sodium chloride  3 mL Intravenous Q12H  . sodium chloride  3 mL Intravenous Q12H  . spironolactone  12.5 mg Oral Daily  . Warfarin - Pharmacist Dosing Inpatient   Does not apply q1800    Infusions:    . sodium chloride      PRN Medications: sodium chloride, acetaminophen, acetaminophen, acetaminophen, ondansetron (ZOFRAN) IV, sodium chloride, sodium chloride, sodium chloride   Assessment:   1. Acute on chronic diastolic/systolic HF heart failure (EF 20-25%) 2. Permanent AF - now with RVR  - on coumadin  3. Morbid obesity/anasarca  4. Sleep apnea with CPAP  5. DM2  6. Chest pressure 7. Hyponatremia  Plan/Discussion:   Volume status continues to improve but still with mild volume overload. Give one more dose of IV lasix and switch to  Demadex 40 mg po bid today.  Will not tirate HF meds due to soft SBP. Overall weight down 44 pounds. Renal function stable but sodium dropping.   Coumadin per pharmacy. INR < 2.0. Prior to admit coumadin followed at [facility].   Continue cardiac rehab.   Lengthy discussion about daily weights, medication compliance, low salt food choices. Anticipate D/C in am.   Raymond Pratt,Raymond Pratt 8:19 AM  Patient seen with NP, agree with the above note.  BP and HR stable.  Continue coumadin, no history of stroke so no overaching need to bridge with heparin gtt.  Diuresed well yesterday, change to po regimen tonight. Will not titrate other CHF meds with soft BP at times.  Anticipate discharge in am.   Raymond Pratt 03/07/2012 1:29 PM

## 2012-03-07 NOTE — Progress Notes (Signed)
Subjective: Seen for reevaluation of DM2. His appetite is good and his dyspnea is much improved. Appreciate care rendered by cardiologist.   Objective: Vital signs in last 24 hours: Temp:  [97.5 F (36.4 C)-98.2 F (36.8 C)] 98 F (36.7 C) (12/31 0900) Pulse Rate:  [47-108] 88  (12/31 0900) Resp:  [15-48] 22  (12/31 0900) BP: (96-127)/(51-87) 125/80 mmHg (12/31 0900) SpO2:  [91 %-98 %] 96 % (12/31 0900) Weight:  [119.7 kg (263 lb 14.3 oz)] 119.7 kg (263 lb 14.3 oz) (12/31 0440) Weight change: -0.8 kg (-1 lb 12.2 oz)   Intake/Output from previous day: 12/30 0701 - 12/31 0700 In: 711.3 [P.O.:360; I.V.:351.3] Out: 2375 [Urine:2375]   General appearance: alert, cooperative and no distress Resp: clear to auscultation bilaterally Cardio: irregularly irregular rhythm Extremities: bilateral trace leg edema  Lab Results:  Basename 03/07/12 0602 03/06/12 0443  WBC 11.0* 10.2  HGB 17.8* 17.1*  HCT 51.7 50.1  PLT 272 317   BMET  Basename 03/07/12 0602 03/06/12 0443  NA 126* 128*  K 4.8 5.0  CL 90* 90*  CO2 25 27  GLUCOSE 129* 123*  BUN 36* 39*  CREATININE 1.24 1.29  CALCIUM 9.8 9.8   CMET CMP     Component Value Date/Time   NA 126* 03/07/2012 0602   K 4.8 03/07/2012 0602   CL 90* 03/07/2012 0602   CO2 25 03/07/2012 0602   GLUCOSE 129* 03/07/2012 0602   BUN 36* 03/07/2012 0602   CREATININE 1.24 03/07/2012 0602   CALCIUM 9.8 03/07/2012 0602   PROT 7.5 02/28/2012 1614   ALBUMIN 3.3* 02/28/2012 1614   AST 36 02/28/2012 1614   ALT 32 02/28/2012 1614   ALKPHOS 98 02/28/2012 1614   BILITOT 1.0 02/28/2012 1614   GFRNONAA 62* 03/07/2012 0602   GFRAA 71* 03/07/2012 0602    CBG (last 3)   Basename 03/07/12 0837 03/06/12 2126 03/06/12 1638  GLUCAP 116* 176* 108*    INR RESULTS:   Lab Results  Component Value Date   INR 1.25 03/07/2012   INR 1.30 03/06/2012   INR 1.27 03/05/2012     Studies/Results: No results found.  Medications: I have reviewed the  patient's current medications.  Assessment/Plan: #1 DM2:  Stable on sliding scale insulin and anticipate that he can restart Januvia upon discharge from the hospital. We have been supplying him with samples.  #2 Obstructive Sleep Apnea: stable on CPAP treatment.   LOS: 8 days   Jehan Bonano G 03/07/2012, 11:00 AM

## 2012-03-07 NOTE — Progress Notes (Signed)
CARDIAC REHAB PHASE I   PRE:  Rate/Rhythm: 90 Afib  BP:  Supine:   Sitting: 125/80  Standing:    SaO2:   MODE:  Ambulation: 1000 ft   POST:  Rate/Rhythem: 117 Afib  BP:  Supine:   Sitting: 110/79  Standing:    SaO2:  0955-1045 Pt tolerated ambulation well without c/o of pain or SOB. Vs stable Pt very anxious to go home. Explained reasons why he is here and making sure that CHF is better and that he is on the right medications. He states that he understands but is so anxious to go home.Pt has a clear understanding of CHF and he can resite the signs and symptoms of CHF. He seems to have trouble applying them to himself. I  gave pt exercise guidelines, discussed zones for CHF,daily weights, diet and when to call MD or 011. He voices understanding. Put CHF video for pt to watch.  Deon Pilling

## 2012-03-07 NOTE — Plan of Care (Signed)
Problem: Phase III Progression Outcomes Goal: Other Phase III Outcomes/Goals Outcome: Completed/Met Date Met:  03/07/12 Rt groin level 0.  Reviewed post cath instructions, pt voices understanding.  Reviewed CHF teaching materials, pt states he understands all this and was doing everything he was supposed to at home but doesn't understand how he gained 40 pounds of fluid weight.  States the furosemide just "stopped working."  Pt pleasant but has poor hygiene and refuses to wash up despite spilling dinner on gown and having obvious body odor.  Refused to wear TED hose.  Given tylenol at HS as requested b/c he states it helps his legs relax.  Wearing CPAP.

## 2012-03-07 NOTE — Progress Notes (Signed)
ANTICOAGULATION CONSULT NOTE - Follow Up Consult  Pharmacy Consult for coumadin Indication: atrial fibrillation  No Known Allergies  Patient Measurements: Height: 5\' 8"  (172.7 cm) Weight: 263 lb 14.3 oz (119.7 kg) IBW/kg (Calculated) : 68.4    Vital Signs: Temp: 98 F (36.7 C) (12/31 0900) Temp src: Oral (12/31 0900) BP: 125/80 mmHg (12/31 0900) Pulse Rate: 88  (12/31 0900)  Labs:  Basename 03/07/12 0602 03/06/12 0443 03/05/12 0425  HGB 17.8* 17.1* --  HCT 51.7 50.1 49.2  PLT 272 317 314  APTT -- -- --  LABPROT 15.5* 15.9* 15.6*  INR 1.25 1.30 1.27  HEPARINUNFRC -- 0.79* 0.36  CREATININE 1.24 1.29 1.25  CKTOTAL -- -- --  CKMB -- -- --  TROPONINI -- -- --    Estimated Creatinine Clearance: 79.7 ml/min (by C-G formula based on Cr of 1.24).  Assessment: 61 yo male with hx of afib on chronic coumadin. Previously coumadin has been on hold for cardiac cath. In the meantime he had been on heparin. Now heparin has been discontinued post-cath and coumadin restarted 12/30.   INR 1.24.  Goal of Therapy:  INR 2-3   Plan:  1. Coumadin 10 mg PO x 1 today 2. Daily INR. 3. Unless he has a large rise in INR tomorrow, he will likely need Coumadin 7.5 mg on 1/1, and then can resume his home dose of 5 mg daily except 7.5 mg on Wed and Sat.  Recommend f/u with [facility] on Friday if possible.  [place] Crane, Pharm D 03/07/2012 10:01 AM

## 2012-03-08 LAB — BASIC METABOLIC PANEL
BUN: 44 mg/dL — ABNORMAL HIGH (ref 6–23)
Chloride: 91 mEq/L — ABNORMAL LOW (ref 96–112)
GFR calc Af Amer: 71 mL/min — ABNORMAL LOW (ref >90)
GFR calc non Af Amer: 61 mL/min — ABNORMAL LOW (ref >90)
Glucose, Bld: 124 mg/dL — ABNORMAL HIGH (ref 70–99)
Potassium: 5 mEq/L (ref 3.5–5.1)
Sodium: 129 mEq/L — ABNORMAL LOW (ref 135–145)

## 2012-03-08 LAB — CBC
HCT: 50.8 % (ref 39.0–52.0)
Hemoglobin: 17.4 g/dL — ABNORMAL HIGH (ref 13.0–17.0)
MCH: 32 pg (ref 26.0–34.0)
MCHC: 34.3 g/dL (ref 30.0–36.0)
RBC: 5.44 MIL/uL (ref 4.22–5.81)

## 2012-03-08 LAB — CARBOXYHEMOGLOBIN
Methemoglobin: 1.3 % (ref 0.0–1.5)
O2 Saturation: 50 %
Total hemoglobin: 18.8 g/dL — ABNORMAL HIGH (ref 13.5–18.0)

## 2012-03-08 MED ORDER — ENALAPRIL MALEATE 2.5 MG PO TABS: 2.5000 mg | ORAL_TABLET | Freq: Two times a day (BID) | ORAL | Status: DC

## 2012-03-08 MED ORDER — SPIRONOLACTONE 12.5 MG HALF TABLET: 12.5000 mg | ORAL_TABLET | Freq: Every day | ORAL | Status: DC

## 2012-03-08 MED ORDER — TORSEMIDE 20 MG PO TABS
40.0000 mg | ORAL_TABLET | Freq: Two times a day (BID) | ORAL | Status: DC
  Administered 2012-03-08: 40 mg via ORAL
  Filled 2012-03-08 (×3): qty 2

## 2012-03-08 MED ORDER — SPIRONOLACTONE 25 MG PO TABS: ORAL_TABLET | ORAL | Status: DC

## 2012-03-08 MED ORDER — TORSEMIDE 20 MG PO TABS: 40.0000 mg | ORAL_TABLET | Freq: Two times a day (BID) | ORAL | Status: DC

## 2012-03-08 MED ORDER — SIMVASTATIN 20 MG PO TABS: 20.0000 mg | ORAL_TABLET | Freq: Every day | ORAL | Status: DC

## 2012-03-08 MED ORDER — METOPROLOL SUCCINATE ER 25 MG PO TB24: 75.0000 mg | ORAL_TABLET | Freq: Every day | ORAL | Status: DC

## 2012-03-08 MED ORDER — DIGOXIN 250 MCG PO TABS: 0.2500 mg | ORAL_TABLET | Freq: Every day | ORAL | Status: DC

## 2012-03-08 NOTE — Progress Notes (Signed)
Patient ID: Roarke Dutkiewicz, male   DOB: 05-10-1950, 62 y.o.   MRN: [id number] Advanced Heart Failure Rounding Note   Subjective:    Mr. Waugaman is a 62 y.o. gentlemen with history of morbid obesity, chronic atrial fibrillation on coumadin (diagnosed in 2010 with failed DCCV), diastolic heart failure (per echo 2010), HTN and sleep apnea with CPAP. Also has history of DM (dx 10 years ago) and left eye surgery due to trauma from nail gun accident.  He has been admitted by Dr. Virgina Jock for progressive edema that has "spread to whole body." Pertinent labs include proBNP 1405, Cr 0.95, Albumin 3.3, INR 2.41. He is in rapid AF with HRs in 110-150 range. CXR with cardiomegaly and vascular congestion and bibasilar atelectasis. No ascites noted on abdominal u/s Wt 306 pounds on admit. He has been placed on IV lasix 80 mg BID. Repeat echo showed LVEF fallen 20-25% (nml 2010), diffuse hypokinesis. Trivial AI, mild MR, LA mildly dilated, RV mod dilated with mod reduced systolic function, PAPP 46 mmHg.   03/06/12 LHC/RHC  RA mean 11  RV 44/8  PA 46/22, mean 32  PCWP mean 20  LV 140/12  AO 128/73  Oxygen saturations:  PA 60%  AO 91%  Cardiac Output (Fick) 4.26  Cardiac Index (Fick) 1.84  Otherwise normal - Right coronary artery (RCA): 30% proximal RCA stenosis   He has diuresed well.  Weight is down 45 lbs.  Po diuretics begun yesterday.  Denies  SOB/PND/orthopnea.  Feels ready to go home.     Objective:   Weight Range:  Vital Signs:   Temp:  [98 F (36.7 C)-98.1 F (36.7 C)] 98 F (36.7 C) (01/01 0819) Pulse Rate:  [66-92] 92  (01/01 0819) Resp:  [14-19] 14  (01/01 0819) BP: (101-138)/(41-76) 138/76 mmHg (01/01 0819) SpO2:  [96 %-100 %] 98 % (01/01 0819) Weight:  [261 lb 3.2 oz (118.48 kg)] 261 lb 3.2 oz (118.48 kg) (01/01 0430) Last BM Date: 03/07/12  Weight change: Filed Weights   03/06/12 0502 03/07/12 0440 03/08/12 0430  Weight: 265 lb 10.5 oz (120.5 kg) 263 lb 14.3 oz (119.7 kg)  261 lb 3.2 oz (118.48 kg)    Intake/Output:   Intake/Output Summary (Last 24 hours) at 03/08/12 [id number] Last data filed at 03/08/12 0600  Gross per 24 hour  Intake    588 ml  Output   2575 ml  Net  -1987 ml     Physical Exam:  General: Obese, chronically-ill appearing. No resp difficulty sitting in chair HEENT: normal  Neck: supple. Very thick. No JVD. Carotids 2+ bilat; no bruits. No lymphadenopathy or thryomegaly appreciated.  Cor: PMI nonpalpable. Irregular distant heart sounds  Lungs: clear  Abdomen: nontender, obese, + distention. Good bowel sounds.  Extremities: no cyanosis, clubbing.  No edema.  Neuro: alert & orientedx3, cranial nerves grossly intact. moves all 4 extremities w/o difficulty. Affect pleasant  Labs: Basic Metabolic Panel:  Lab [id number] 0500 03/07/12 0602 03/06/12 0443 03/05/12 0425 03/04/12 0430  NA 129* 126* 128* 125* 127*  K 5.0 4.8 5.0 4.5 4.7  CL 91* 90* 90* 89* 89*  CO2 25 25 27 28 28   GLUCOSE 124* 129* 123* 120* 125*  BUN 44* 36* 39* 35* 29*  CREATININE 1.25 1.24 1.29 1.25 1.24  CALCIUM 9.9 9.8 9.8 -- --  MG -- -- -- -- --  PHOS -- -- -- -- --    Liver Function Tests: No results found for this basename: AST:5,ALT:5,ALKPHOS:5,BILITOT:5,PROT:5,ALBUMIN:5 in the  last 168 hours No results found for this basename: LIPASE:5,AMYLASE:5 in the last 168 hours No results found for this basename: AMMONIA:3 in the last 168 hours  CBC:  Lab 03/08/12 0500 03/07/12 0602 03/06/12 0443 03/05/12 0425 03/04/12 0430  WBC 9.6 11.0* 10.2 9.3 8.9  NEUTROABS -- -- -- -- --  HGB 17.4* 17.8* 17.1* 16.8 16.6  HCT 50.8 51.7 50.1 49.2 48.9  MCV 93.4 93.7 93.8 94.3 94.4  PLT 251 272 317 314 287    Cardiac Enzymes: No results found for this basename: CKTOTAL:5,CKMB:5,CKMBINDEX:5,TROPONINI:5 in the last 168 hours  BNP: BNP (last 3 results)  Basename 02/28/12 1534  PROBNP 1405.0*     Other results:    Imaging: No results found.   Medications:      Scheduled Medications:    . aspirin EC  81 mg Oral Daily  . digoxin  0.25 mg Oral Daily  . enalapril  2.5 mg Oral BID  . insulin aspart  0-15 Units Subcutaneous TID WC  . linagliptin  5 mg Oral Daily  . metoprolol succinate  75 mg Oral Daily  . simvastatin  20 mg Oral q1800  . spironolactone  12.5 mg Oral Daily  . torsemide  40 mg Oral Daily  . Warfarin - Pharmacist Dosing Inpatient   Does not apply q1800    Infusions:    PRN Medications: acetaminophen, acetaminophen, acetaminophen, ondansetron (ZOFRAN) IV   Assessment:   1. Acute on chronic diastolic/systolic HF heart failure (EF 20-25%) 2. Permanent AF  - on coumadin  3. Morbid obesity/anasarca  4. Sleep apnea with CPAP  5. DM2  6. Chest pressure 7. Hyponatremia  Plan/Discussion:   Doing well today on po meds.  Weight down 45 lbs total. HR controlled on Toprol XL. K is upper normal today and BUN has risen.  He had last dose of IV diuretic yesterday morning.  Will continue spironolactone at low dose but will need K closely followed.  Coumadin restarted post-cath.  INR is 1.22 today.  No history of CVA so I think it would be reasonable to allow INR to trend up without Lovenox/heparin bridging.    Will discharge today.  - Followup 1/7 in CHF clinic.  Will need close INR followup at Dr. Keane Police office.  Needs BMET and digoxin level on Monday 1/6 in CHF clinic.   - Cardiac meds for discharge: Coumadin per Dr. Keane [employer] office, ASA 81, digoxin 0.25 mg daily, enalapril 2.5 mg bid, Toprol XL 75 daily, spironolactone 12.5 mg daily, torsemide 40 mg po bid, pravastatin at prior dosing.   [name] 03/08/2012 9:07 AM

## 2012-03-08 NOTE — Progress Notes (Signed)
Pt places self on CPAP. Able to setup and remove mask himself.
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
## 2012-03-14 ENCOUNTER — Ambulatory Visit (HOSPITAL_COMMUNITY): Admit: 2012-03-14 | Payer: Self-pay

## 2013-01-15 ENCOUNTER — Inpatient Hospital Stay (HOSPITAL_COMMUNITY)
Admission: EM | Admit: 2013-01-15 | Discharge: 2013-01-20 | DRG: 292 | Disposition: A | Discharge status: Home Health | Payer: BC Managed Care – PPO | Attending: Cardiology | Admitting: Cardiology

## 2013-01-15 ENCOUNTER — Encounter (HOSPITAL_COMMUNITY): Payer: Self-pay | Admitting: Emergency Medicine

## 2013-01-15 ENCOUNTER — Emergency Department (HOSPITAL_COMMUNITY): Payer: BC Managed Care – PPO

## 2013-01-15 DIAGNOSIS — I4891 Unspecified atrial fibrillation: Secondary | ICD-10-CM | POA: Diagnosis present

## 2013-01-15 DIAGNOSIS — I10 Essential (primary) hypertension: Secondary | ICD-10-CM | POA: Diagnosis present

## 2013-01-15 DIAGNOSIS — IMO0001 Reserved for inherently not codable concepts without codable children: Secondary | ICD-10-CM | POA: Diagnosis present

## 2013-01-15 DIAGNOSIS — M109 Gout, unspecified: Secondary | ICD-10-CM | POA: Diagnosis present

## 2013-01-15 DIAGNOSIS — Z7901 Long term (current) use of anticoagulants: Secondary | ICD-10-CM

## 2013-01-15 DIAGNOSIS — I428 Other cardiomyopathies: Secondary | ICD-10-CM | POA: Diagnosis present

## 2013-01-15 DIAGNOSIS — R21 Rash and other nonspecific skin eruption: Secondary | ICD-10-CM | POA: Diagnosis present

## 2013-01-15 DIAGNOSIS — E871 Hypo-osmolality and hyponatremia: Secondary | ICD-10-CM | POA: Diagnosis present

## 2013-01-15 DIAGNOSIS — E1169 Type 2 diabetes mellitus with other specified complication: Secondary | ICD-10-CM | POA: Diagnosis present

## 2013-01-15 DIAGNOSIS — Z7982 Long term (current) use of aspirin: Secondary | ICD-10-CM

## 2013-01-15 DIAGNOSIS — R791 Abnormal coagulation profile: Secondary | ICD-10-CM | POA: Diagnosis present

## 2013-01-15 DIAGNOSIS — I2510 Atherosclerotic heart disease of native coronary artery without angina pectoris: Secondary | ICD-10-CM | POA: Diagnosis present

## 2013-01-15 DIAGNOSIS — I5043 Acute on chronic combined systolic (congestive) and diastolic (congestive) heart failure: Principal | ICD-10-CM | POA: Diagnosis present

## 2013-01-15 DIAGNOSIS — G4733 Obstructive sleep apnea (adult) (pediatric): Secondary | ICD-10-CM | POA: Diagnosis present

## 2013-01-15 DIAGNOSIS — Z79899 Other long term (current) drug therapy: Secondary | ICD-10-CM

## 2013-01-15 DIAGNOSIS — I509 Heart failure, unspecified: Secondary | ICD-10-CM | POA: Diagnosis present

## 2013-01-15 DIAGNOSIS — N289 Disorder of kidney and ureter, unspecified: Secondary | ICD-10-CM

## 2013-01-15 DIAGNOSIS — M7989 Other specified soft tissue disorders: Secondary | ICD-10-CM

## 2013-01-15 DIAGNOSIS — Z6841 Body Mass Index (BMI) 40.0 and over, adult: Secondary | ICD-10-CM

## 2013-01-15 HISTORY — DX: Essential (primary) hypertension: I10

## 2013-01-15 HISTORY — DX: Permanent atrial fibrillation: I48.21

## 2013-01-15 HISTORY — DX: Atherosclerotic heart disease of native coronary artery without angina pectoris: I25.10

## 2013-01-15 HISTORY — DX: Other cardiomyopathies: I42.8

## 2013-01-15 HISTORY — DX: Personal history of other medical treatment: Z92.89

## 2013-01-15 HISTORY — DX: Chronic combined systolic (congestive) and diastolic (congestive) heart failure: I50.42

## 2013-01-15 LAB — PRO B NATRIURETIC PEPTIDE: Pro B Natriuretic peptide (BNP): 1239 pg/mL — ABNORMAL HIGH (ref 0–125)

## 2013-01-15 LAB — PROTIME-INR: INR: 1.6 — ABNORMAL HIGH (ref 0.00–1.49)

## 2013-01-15 LAB — CBC WITH DIFFERENTIAL/PLATELET
Basophils Absolute: 0 10*3/uL (ref 0.0–0.1)
Eosinophils Relative: 2 % (ref 0–5)
HCT: 43.5 % (ref 39.0–52.0)
Hemoglobin: 14.6 g/dL (ref 13.0–17.0)
Lymphocytes Relative: 19 % (ref 12–46)
Lymphs Abs: 1.4 10*3/uL (ref 0.7–4.0)
MCV: 96 fL (ref 78.0–100.0)
Monocytes Absolute: 0.9 10*3/uL (ref 0.1–1.0)
Monocytes Relative: 13 % — ABNORMAL HIGH (ref 3–12)
Neutro Abs: 5 10*3/uL (ref 1.7–7.7)
RDW: 15.1 % (ref 11.5–15.5)
WBC: 7.5 10*3/uL (ref 4.0–10.5)

## 2013-01-15 LAB — COMPREHENSIVE METABOLIC PANEL
AST: 43 U/L — ABNORMAL HIGH (ref 0–37)
BUN: 27 mg/dL — ABNORMAL HIGH (ref 6–23)
CO2: 29 mEq/L (ref 19–32)
Calcium: 9.3 mg/dL (ref 8.4–10.5)
Chloride: 96 mEq/L (ref 96–112)
Creatinine, Ser: 1.13 mg/dL (ref 0.50–1.35)
GFR calc Af Amer: 79 mL/min — ABNORMAL LOW (ref >90)
GFR calc non Af Amer: 68 mL/min — ABNORMAL LOW (ref >90)
Glucose, Bld: 154 mg/dL — ABNORMAL HIGH (ref 70–99)
Total Bilirubin: 1.2 mg/dL (ref 0.3–1.2)

## 2013-01-15 LAB — POCT I-STAT, CHEM 8
BUN: 32 mg/dL — ABNORMAL HIGH (ref 6–23)
Calcium, Ion: 1.05 mmol/L — ABNORMAL LOW (ref 1.13–1.30)
Chloride: 97 mEq/L (ref 96–112)
Hemoglobin: 15.6 g/dL (ref 13.0–17.0)
Sodium: 139 mEq/L (ref 135–145)
TCO2: 27 mmol/L (ref 0–100)

## 2013-01-15 LAB — GLUCOSE, CAPILLARY
Glucose-Capillary: 174 mg/dL — ABNORMAL HIGH (ref 70–99)
Glucose-Capillary: 137 mg/dL — ABNORMAL HIGH (ref 70–99)

## 2013-01-15 LAB — POCT I-STAT TROPONIN I: Troponin i, poc: 0.02 ng/mL (ref 0.00–0.08)

## 2013-01-15 LAB — URIC ACID: Uric Acid, Serum: 13 mg/dL — ABNORMAL HIGH (ref 4.0–7.8)

## 2013-01-15 LAB — POCT I-STAT 3, ART BLOOD GAS (G3+)
Bicarbonate: 28.3 mEq/L — ABNORMAL HIGH (ref 20.0–24.0)
O2 Saturation: 93 %
Patient temperature: 37
TCO2: 29 mmol/L (ref 0–100)
pCO2 arterial: 39.2 mmHg (ref 35.0–45.0)
pH, Arterial: 7.466 — ABNORMAL HIGH (ref 7.350–7.450)

## 2013-01-15 LAB — MRSA PCR SCREENING: MRSA by PCR: NEGATIVE

## 2013-01-15 MED ORDER — CLOBETASOL PROPIONATE 0.05 % EX CREA
TOPICAL_CREAM | Freq: Two times a day (BID) | CUTANEOUS | Status: DC
  Administered 2013-01-15 (×2): via TOPICAL
  Administered 2013-01-16: 1 via TOPICAL
  Administered 2013-01-16 – 2013-01-19 (×6): via TOPICAL
  Filled 2013-01-15: qty 15

## 2013-01-15 MED ORDER — DILTIAZEM HCL 100 MG IV SOLR
5.0000 mg/h | INTRAVENOUS | Status: DC
  Administered 2013-01-15: 15 mg/h via INTRAVENOUS
  Administered 2013-01-16: 10 mg/h via INTRAVENOUS
  Filled 2013-01-15 (×2): qty 100

## 2013-01-15 MED ORDER — ATORVASTATIN CALCIUM 10 MG PO TABS
10.0000 mg | ORAL_TABLET | Freq: Every day | ORAL | Status: DC
  Administered 2013-01-15 – 2013-01-19 (×5): 10 mg via ORAL
  Filled 2013-01-15 (×6): qty 1

## 2013-01-15 MED ORDER — ALPRAZOLAM 0.25 MG PO TABS
0.2500 mg | ORAL_TABLET | Freq: Two times a day (BID) | ORAL | Status: DC | PRN
  Administered 2013-01-19: 0.25 mg via ORAL
  Filled 2013-01-15: qty 1

## 2013-01-15 MED ORDER — INSULIN ASPART 100 UNIT/ML ~~LOC~~ SOLN
0.0000 [IU] | Freq: Three times a day (TID) | SUBCUTANEOUS | Status: DC
  Administered 2013-01-15: 2 [IU] via SUBCUTANEOUS
  Administered 2013-01-15: 3 [IU] via SUBCUTANEOUS
  Administered 2013-01-16 – 2013-01-19 (×7): 2 [IU] via SUBCUTANEOUS
  Administered 2013-01-19: 3 [IU] via SUBCUTANEOUS
  Administered 2013-01-20: 2 [IU] via SUBCUTANEOUS
  Filled 2013-01-15: qty 1

## 2013-01-15 MED ORDER — INSULIN ASPART 100 UNIT/ML ~~LOC~~ SOLN: 0.0000 [IU] | Freq: Every day | SUBCUTANEOUS | Status: DC

## 2013-01-15 MED ORDER — SODIUM CHLORIDE 0.9 % IJ SOLN
3.0000 mL | Freq: Two times a day (BID) | INTRAMUSCULAR | Status: DC
  Administered 2013-01-15 – 2013-01-18 (×4): 3 mL via INTRAVENOUS

## 2013-01-15 MED ORDER — ENALAPRIL MALEATE 2.5 MG PO TABS
2.5000 mg | ORAL_TABLET | Freq: Two times a day (BID) | ORAL | Status: DC
  Administered 2013-01-15 – 2013-01-20 (×11): 2.5 mg via ORAL
  Filled 2013-01-15 (×12): qty 1

## 2013-01-15 MED ORDER — ENOXAPARIN SODIUM 150 MG/ML ~~LOC~~ SOLN
150.0000 mg | Freq: Two times a day (BID) | SUBCUTANEOUS | Status: DC
  Administered 2013-01-15 – 2013-01-16 (×4): 150 mg via SUBCUTANEOUS
  Filled 2013-01-15 (×6): qty 1

## 2013-01-15 MED ORDER — DILTIAZEM HCL 25 MG/5ML IV SOLN
10.0000 mg | Freq: Once | INTRAVENOUS | Status: AC
  Administered 2013-01-15: 10 mg via INTRAVENOUS
  Filled 2013-01-15: qty 5

## 2013-01-15 MED ORDER — LINAGLIPTIN 5 MG PO TABS
5.0000 mg | ORAL_TABLET | Freq: Every day | ORAL | Status: DC
  Administered 2013-01-15 – 2013-01-20 (×6): 5 mg via ORAL
  Filled 2013-01-15 (×8): qty 1

## 2013-01-15 MED ORDER — WARFARIN SODIUM 7.5 MG PO TABS
7.5000 mg | ORAL_TABLET | Freq: Once | ORAL | Status: AC
  Administered 2013-01-15: 7.5 mg via ORAL
  Filled 2013-01-15: qty 1

## 2013-01-15 MED ORDER — ACETAMINOPHEN 325 MG PO TABS
650.0000 mg | ORAL_TABLET | ORAL | Status: DC | PRN
  Administered 2013-01-17 – 2013-01-20 (×5): 650 mg via ORAL
  Filled 2013-01-15 (×5): qty 2

## 2013-01-15 MED ORDER — NITROGLYCERIN 0.4 MG SL SUBL: 0.4000 mg | SUBLINGUAL_TABLET | SUBLINGUAL | Status: DC | PRN

## 2013-01-15 MED ORDER — WARFARIN - PHARMACIST DOSING INPATIENT: Freq: Every day | Status: DC

## 2013-01-15 MED ORDER — METOPROLOL TARTRATE 12.5 MG HALF TABLET
12.5000 mg | ORAL_TABLET | Freq: Four times a day (QID) | ORAL | Status: DC
  Administered 2013-01-15 – 2013-01-16 (×4): 12.5 mg via ORAL
  Filled 2013-01-15 (×8): qty 1

## 2013-01-15 MED ORDER — ZOLPIDEM TARTRATE 5 MG PO TABS
5.0000 mg | ORAL_TABLET | Freq: Every evening | ORAL | Status: DC | PRN
  Administered 2013-01-15 – 2013-01-18 (×4): 5 mg via ORAL
  Filled 2013-01-15 (×4): qty 1

## 2013-01-15 MED ORDER — FUROSEMIDE 10 MG/ML IJ SOLN
40.0000 mg | Freq: Once | INTRAMUSCULAR | Status: AC
  Administered 2013-01-15: 40 mg via INTRAVENOUS
  Filled 2013-01-15: qty 4

## 2013-01-15 MED ORDER — ONDANSETRON HCL 4 MG/2ML IJ SOLN: 4.0000 mg | Freq: Four times a day (QID) | INTRAMUSCULAR | Status: DC | PRN

## 2013-01-15 MED ORDER — DILTIAZEM HCL 100 MG IV SOLR
5.0000 mg/h | Freq: Once | INTRAVENOUS | Status: AC
  Administered 2013-01-15: 5 mg/h via INTRAVENOUS
  Filled 2013-01-15 (×2): qty 100

## 2013-01-15 MED ORDER — ASPIRIN 81 MG PO TABS
81.0000 mg | ORAL_TABLET | Freq: Every day | ORAL | Status: DC
  Administered 2013-01-15 – 2013-01-16 (×2): 81 mg via ORAL
  Filled 2013-01-15 (×6): qty 1

## 2013-01-15 MED ORDER — FUROSEMIDE 10 MG/ML IJ SOLN
40.0000 mg | INTRAMUSCULAR | Status: AC
  Administered 2013-01-15: 40 mg via INTRAVENOUS
  Filled 2013-01-15: qty 4

## 2013-01-15 MED ORDER — DIGOXIN 250 MCG PO TABS
0.2500 mg | ORAL_TABLET | Freq: Every day | ORAL | Status: DC
  Administered 2013-01-15 – 2013-01-20 (×6): 0.25 mg via ORAL
  Filled 2013-01-15: qty 2
  Filled 2013-01-15 (×5): qty 1

## 2013-01-15 MED ORDER — SODIUM CHLORIDE 0.9 % IV SOLN: 250.0000 mL | INTRAVENOUS | Status: DC | PRN

## 2013-01-15 MED ORDER — DEXTROSE 5 % IV SOLN
20.0000 mg/h | INTRAVENOUS | Status: DC
  Administered 2013-01-15: 5 mg/h via INTRAVENOUS
  Administered 2013-01-17: 20 mg/h via INTRAVENOUS
  Filled 2013-01-15 (×14): qty 25

## 2013-01-15 MED ORDER — ALLOPURINOL 300 MG PO TABS
300.0000 mg | ORAL_TABLET | Freq: Every day | ORAL | Status: DC
  Administered 2013-01-15 – 2013-01-20 (×6): 300 mg via ORAL
  Filled 2013-01-15 (×6): qty 1

## 2013-01-15 MED ORDER — SODIUM CHLORIDE 0.9 % IJ SOLN: 3.0000 mL | INTRAMUSCULAR | Status: DC | PRN

## 2013-01-15 MED ORDER — SIMVASTATIN 20 MG PO TABS
20.0000 mg | ORAL_TABLET | Freq: Every day | ORAL | Status: DC
  Filled 2013-01-15: qty 1

## 2013-01-15 NOTE — Progress Notes (Signed)
*  PRELIMINARY RESULTS* Vascular Ultrasound Lower extremity venous duplex has been completed.  Preliminary findings: no obvious evidence of DVT in visualized veins.   Landry Mellow, RDMS, RVT  01/15/2013, 9:24 AM

## 2013-01-15 NOTE — ED Notes (Signed)
Suanne Marker, [place] with cardiology at the bedside.

## 2013-01-15 NOTE — ED Notes (Signed)
Phlebotomy at the bedside  

## 2013-01-15 NOTE — ED Notes (Signed)
Pt. reports progressing SOB with weight gain , edema at upper thighs and abdomen for several days , denies chest pain , pt. stated history of CHF .
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
## 2013-01-15 NOTE — ED Notes (Signed)
There are currently no stepdown beds available. Pt made aware. Recliner placed at bedside for pt. States he'll stay in that and he can't lay down.

## 2013-01-15 NOTE — Progress Notes (Signed)
Placed pt. On cpap. Pt. Is tolerating well at this time. Pt. Placed on 6 cmh20 per pt. Request.

## 2013-01-15 NOTE — Care Management Note (Addendum)
    Page 1 of 2   01/18/2013     11:22:15 AM   CARE MANAGEMENT NOTE 01/18/2013  Patient:  Raymond Pratt, Raymond Pratt   Account Number:  [id number]  Date Initiated:  01/15/2013  Documentation initiated by:  Elissa Hefty  Subjective/Objective Assessment:   adm w at fib     Action/Plan:   lives alone, pcp dr Shon Baton   Anticipated DC Date:     Anticipated DC Plan:  [place]  CM consult      Choice offered to / List presented to:          [facility] arranged  [place] - 11 Patient Refused      Status of service:   Medicare Important Message given?   (If response is "NO", the following Medicare IM given date fields will be blank) Date Medicare IM given:   Date Additional Medicare IM given:    Discharge Disposition:    Per UR Regulation:  Reviewed for med. necessity/level of care/duration of stay  If discussed at [place] of Stay Meetings, dates discussed:    Comments:  11/13   1121 debbie Kyeisha Janowicz rn,bsn spoke w pt again today. he states that he does not want hhc. will let md know that he does not want hhc.  11/12  1523 debbie Efrata Brunner rn,bsn pt really does not want hhc. he states he will follow up w md and hopes to return to work. will be available if pt changes his mind. will cont to follow.  11/12 1156a debbie Crystalynn Mcinerney rn,bsn spoke w pt and he would like to get back to work. if he is not able to return home immediately after disch he would be willing for hhc nse to ck on him. he seems to have good understanding of heart failure and how to manage. ref to donna w [facility] for hhrn for chf. no pref to hhc agency by pt as long as they accept bcbs ins. pt plans to ck on disability in 12-14 to start process incase he can't work.
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
## 2013-01-15 NOTE — ED Notes (Signed)
Pt states he was at the doctor on Friday and from then to now his scale said he had gained 20 lbs.

## 2013-01-15 NOTE — ED Notes (Signed)
Admitting Cardiologist at bedside.

## 2013-01-16 ENCOUNTER — Inpatient Hospital Stay (HOSPITAL_COMMUNITY): Payer: BC Managed Care – PPO

## 2013-01-16 ENCOUNTER — Encounter (HOSPITAL_COMMUNITY): Payer: Self-pay

## 2013-01-16 DIAGNOSIS — I059 Rheumatic mitral valve disease, unspecified: Secondary | ICD-10-CM

## 2013-01-16 LAB — GLUCOSE, CAPILLARY
Glucose-Capillary: 140 mg/dL — ABNORMAL HIGH (ref 70–99)
Glucose-Capillary: 145 mg/dL — ABNORMAL HIGH (ref 70–99)

## 2013-01-16 LAB — BASIC METABOLIC PANEL
BUN: 22 mg/dL (ref 6–23)
Calcium: 8.8 mg/dL (ref 8.4–10.5)
Chloride: 97 mEq/L (ref 96–112)
Creatinine, Ser: 1.01 mg/dL (ref 0.50–1.35)
GFR calc Af Amer: >90 mL/min (ref >90)
Glucose, Bld: 165 mg/dL — ABNORMAL HIGH (ref 70–99)

## 2013-01-16 LAB — PROTIME-INR: Prothrombin Time: 17.7 seconds — ABNORMAL HIGH (ref 11.6–15.2)

## 2013-01-16 MED ORDER — METOLAZONE 5 MG PO TABS
5.0000 mg | ORAL_TABLET | Freq: Once | ORAL | Status: AC
  Administered 2013-01-16: 5 mg via ORAL
  Filled 2013-01-16: qty 1

## 2013-01-16 MED ORDER — PERFLUTREN LIPID MICROSPHERE
INTRAVENOUS | Status: AC
  Administered 2013-01-16: 2 mL
  Filled 2013-01-16: qty 10

## 2013-01-16 MED ORDER — WARFARIN SODIUM 10 MG PO TABS
10.0000 mg | ORAL_TABLET | Freq: Once | ORAL | Status: AC
  Administered 2013-01-16: 10 mg via ORAL
  Filled 2013-01-16: qty 1

## 2013-01-16 MED ORDER — POTASSIUM CHLORIDE CRYS ER 20 MEQ PO TBCR
40.0000 meq | EXTENDED_RELEASE_TABLET | Freq: Three times a day (TID) | ORAL | Status: DC
  Administered 2013-01-16 – 2013-01-19 (×10): 40 meq via ORAL
  Filled 2013-01-16 (×4): qty 2
  Filled 2013-01-16: qty 4
  Filled 2013-01-16 (×8): qty 2

## 2013-01-16 NOTE — Progress Notes (Addendum)
Pt is up and down from bed to chair. Pt takes off CPAP, and O2 whenever he wants despite continued education about O2 sats. Pt has been educated repeatedly about calling nurse for help before attempting to move about the room so he does not fall or get caught up on the wires/iv tubing. Pt refuses to call, and instead continues to get himself twisted up over and over again in the lines and tubing. Pt just returned to bed; bed alarm turned on.
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
## 2013-01-16 NOTE — Procedures (Signed)
Procedure:  Right arm PICC line placement Access:  Right brachial vein 36 cm DL Power PICC placed with tip at cavoatrial junction

## 2013-01-16 NOTE — Progress Notes (Signed)
Advanced Heart Failure Rounding Note   Subjective:    Raymond Pratt is a 62 y.o. male with positive PMH of chronic combined systolic and diastolic CHF (99991111 Echo: EF 20-25%, NYHA class III), A-fib w/ RVR (2010 s/p failed cardioversion), DM, HTN and OSA on CPAP who presents to the [facility] ED with SOB.   Over the last 6 weeks notes that he has had a functional decline and weight has been trending up. Has been seen multiple times at PCP with diuretic adjustments. Presented to the ED yesterday with progressive increased SOB, orthopnea, and DOE. Weight on admission 316 lbs (baseline 261 lbs)  Started on lasix gtt yesterday 5 mg/hr. Weight is down 2 lbs and 24 hr I/O -420 cc. INR on admission subtherapeutic 1.6 and he was started on lovenox this am 1.5. +orthopnea and SOB. Denies CP. Remains on cardizem gtt 10 mg/hr.  Objective:   Weight Range:  Vital Signs:   Temp:  [97.3 F (36.3 C)-98.4 F (36.9 C)] 98.4 F (36.9 C) (11/11 0321) Pulse Rate:  [48-115] 100 (11/10 2345) Resp:  [18-29] 20 (11/11 0107) BP: (91-128)/(49-108) 128/108 mmHg (11/11 0320) SpO2:  [92 %-100 %] 96 % (11/11 0320) Weight:  [314 lb 13.1 oz (142.8 kg)-316 lb 9.3 oz (143.6 kg)] 314 lb 13.1 oz (142.8 kg) (11/11 0328) Last BM Date: 01/15/13  Weight change: Filed Weights   01/15/13 1154 01/16/13 0328  Weight: 316 lb 9.3 oz (143.6 kg) 314 lb 13.1 oz (142.8 kg)    Intake/Output:   Intake/Output Summary (Last 24 hours) at 01/16/13 0709 Last data filed at 01/16/13 0600  Gross per 24 hour  Intake   1380 ml  Output   1800 ml  Net   -420 ml     Physical Exam: General:  Chronically ill appearing. No resp difficulty; sitting in chair HEENT: normal Neck: supple. JVP difficult to assess d/t body habitus, but appears to be elevated . Carotids 2+ bilat; no bruits. No lymphadenopathy or thryomegaly appreciated. Cor: PMI nondisplaced. Regular rate &  Irregular rhythm. No rubs, gallops or murmurs. Lungs: clear Abdomen:  soft, nontender, +++distended. No hepatosplenomegaly. No bruits or masses. Good bowel sounds. Extremities: no cyanosis, clubbing, rash, Woody edema all the way to thighs Neuro: alert & orientedx3, cranial nerves grossly intact. moves all 4 extremities w/o difficulty. Affect pleasant  Telemetry: Afib 80-90s  Labs: Basic Metabolic Panel:  Recent Labs Lab 01/15/13 0441 01/15/13 0508  NA 137 139  K 3.6 3.4*  CL 96 97  CO2 29  --   GLUCOSE 154* 156*  BUN 27* 32*  CREATININE 1.13 1.40*  CALCIUM 9.3  --     Liver Function Tests:  Recent Labs Lab 01/15/13 0441  AST 43*  ALT 28  ALKPHOS 117  BILITOT 1.2  PROT 7.8  ALBUMIN 3.4*   No results found for this basename: LIPASE, AMYLASE,  in the last 168 hours No results found for this basename: AMMONIA,  in the last 168 hours  CBC:  Recent Labs Lab 01/15/13 0441 01/15/13 0508  WBC 7.5  --   NEUTROABS 5.0  --   HGB 14.6 15.6  HCT 43.5 46.0  MCV 96.0  --   PLT 256  --     Cardiac Enzymes: No results found for this basename: CKTOTAL, CKMB, CKMBINDEX, TROPONINI,  in the last 168 hours  BNP: BNP (last 3 results)  Recent Labs  02/28/12 1534 01/15/13 0444  PROBNP 1405.0* 1239.0*    Imaging: Dg Chest 2  View  01/15/2013   CLINICAL DATA:  Shortness of breath, leg swelling, congestive heart failure  EXAM: CHEST  2 VIEW  COMPARISON:  Prior radiograph from 02/28/2012  FINDINGS: Cardiomegaly is grossly stable as compared to prior studies.  The lungs are normally inflated. There is diffuse pulmonary vascular congestion with interstitial thickening, consistent with mild pulmonary edema. Few scattered Kerley B-lines are noted at the left lung base. Bibasilar opacities may reflect atelectasis, edema, or less likely infiltrates. No pneumothorax. A small right pleural effusion is present.  No acute osseous abnormality.  IMPRESSION: 1. Cardiomegaly with mild pulmonary edema. 2. Small right pleural effusion.   Electronically Signed    By: Jeannine Boga M.D.   On: 01/15/2013 06:27      Medications:     Scheduled Medications: . allopurinol  300 mg Oral Daily  . aspirin  81 mg Oral Daily  . atorvastatin  10 mg Oral q1800  . clobetasol cream   Topical BID  . digoxin  0.25 mg Oral Daily  . enalapril  2.5 mg Oral BID  . enoxaparin (LOVENOX) injection  150 mg Subcutaneous Q12H  . insulin aspart  0-15 Units Subcutaneous TID WC  . insulin aspart  0-5 Units Subcutaneous QHS  . linagliptin  5 mg Oral Daily  . metoprolol tartrate  12.5 mg Oral Q6H  . sodium chloride  3 mL Intravenous Q12H  . Warfarin - Pharmacist Dosing Inpatient   Does not apply q1800     Infusions: . diltiazem (CARDIZEM) infusion 10 mg/hr (01/16/13 0328)  . furosemide (LASIX) infusion 5 mg/hr (01/15/13 1145)     PRN Medications:  sodium chloride, acetaminophen, ALPRAZolam, nitroGLYCERIN, ondansetron (ZOFRAN) IV, sodium chloride, zolpidem   Assessment:   1) A/C systolic HF 2) NICM  3) Non obstructive CAD 4) DM2 5) Afib, on coumadin 6) OSA, on CPAP 7) HTN   Plan/Discussion:    Patient remains markedly volume overloaded, about 50 lbs from baseline. Will increase lasix gtt to 15 mg/hr and add metolazone 5 mg today. BMET pending. Will discuss with Dr. Haroldine Laws, whether UF would be beneficial.  Remains in Afib with rate control, will wean cardizem down to 5 mg hr and if rate remains stable discontinue. On lopressor q6hr will hold for now d/t volume overload. If rate becomes an issue can switch to amiodarone.  ECHO pending.   Place PICC for CVP measuring.   INR remains subtherapeutic, pharmacy dosing Coumadin. Will continue lovenox.   Will consult CM and [facility].   Length of Stay: 1   Rande Brunt 01/16/2013, 7:09 AM  Advanced Heart Failure Team Pager [phone] (M-F; 7a - 4p)  Please contact [facility] for night-coverage after hours (4p -7a ) and weekends on [email or web address]  Patient seen and examined with Junie Bame, NP. We discussed all aspects of the encounter. I agree with the assessment and plan as stated above. He is markedly volume overloaded and not responding well to low-dose IV lasix. We discussed increasing lasix gtt versus ultrafiltration as well as PICC versus central line. He prefers to try higher dose lasix via PICC. We will arrange for this today. Will also add metolazone. Continue coumadin.   Echo today.   Quillian Quince Jedidiah Demartini,MD 12:32 PM

## 2013-01-16 NOTE — Progress Notes (Signed)
Echocardiogram 2D Echocardiogram has been performed.  Raymond Pratt 01/16/2013, 1:17 PM

## 2013-01-17 LAB — BASIC METABOLIC PANEL
BUN: 23 mg/dL (ref 6–23)
BUN: 28 mg/dL — ABNORMAL HIGH (ref 6–23)
CO2: 29 mEq/L (ref 19–32)
Chloride: 92 mEq/L — ABNORMAL LOW (ref 96–112)
Creatinine, Ser: 1.31 mg/dL (ref 0.50–1.35)
GFR calc Af Amer: 66 mL/min — ABNORMAL LOW (ref >90)
GFR calc non Af Amer: 71 mL/min — ABNORMAL LOW (ref >90)
GFR calc non Af Amer: 57 mL/min — ABNORMAL LOW (ref >90)
Glucose, Bld: 116 mg/dL — ABNORMAL HIGH (ref 70–99)
Glucose, Bld: 264 mg/dL — ABNORMAL HIGH (ref 70–99)
Potassium: 3.6 mEq/L (ref 3.5–5.1)
Potassium: 3.6 mEq/L (ref 3.5–5.1)
Sodium: 135 mEq/L (ref 135–145)

## 2013-01-17 LAB — GLUCOSE, CAPILLARY
Glucose-Capillary: 128 mg/dL — ABNORMAL HIGH (ref 70–99)
Glucose-Capillary: 128 mg/dL — ABNORMAL HIGH (ref 70–99)
Glucose-Capillary: 115 mg/dL — ABNORMAL HIGH (ref 70–99)
Glucose-Capillary: 135 mg/dL — ABNORMAL HIGH (ref 70–99)

## 2013-01-17 MED ORDER — METOPROLOL SUCCINATE ER 25 MG PO TB24
25.0000 mg | ORAL_TABLET | Freq: Two times a day (BID) | ORAL | Status: DC
  Administered 2013-01-17 – 2013-01-18 (×3): 25 mg via ORAL
  Filled 2013-01-17 (×5): qty 1

## 2013-01-17 MED ORDER — INSULIN ASPART 100 UNIT/ML ~~LOC~~ SOLN: 0.0000 [IU] | Freq: Three times a day (TID) | SUBCUTANEOUS | Status: DC

## 2013-01-17 MED ORDER — SPIRONOLACTONE 12.5 MG HALF TABLET
12.5000 mg | ORAL_TABLET | Freq: Every day | ORAL | Status: DC
  Administered 2013-01-17 – 2013-01-20 (×4): 12.5 mg via ORAL
  Filled 2013-01-17 (×4): qty 1

## 2013-01-17 MED ORDER — ENOXAPARIN SODIUM 150 MG/ML ~~LOC~~ SOLN
1.0000 mg/kg | Freq: Two times a day (BID) | SUBCUTANEOUS | Status: DC
  Administered 2013-01-17 (×2): 135 mg via SUBCUTANEOUS
  Filled 2013-01-17 (×4): qty 1

## 2013-01-17 MED ORDER — ASPIRIN EC 81 MG PO TBEC
81.0000 mg | DELAYED_RELEASE_TABLET | Freq: Every day | ORAL | Status: DC
  Administered 2013-01-17 – 2013-01-20 (×4): 81 mg via ORAL
  Filled 2013-01-17 (×4): qty 1

## 2013-01-17 MED ORDER — METOLAZONE 5 MG PO TABS
5.0000 mg | ORAL_TABLET | Freq: Once | ORAL | Status: AC
  Administered 2013-01-17: 5 mg via ORAL
  Filled 2013-01-17: qty 1

## 2013-01-17 MED ORDER — WARFARIN SODIUM 10 MG PO TABS
10.0000 mg | ORAL_TABLET | Freq: Once | ORAL | Status: AC
  Administered 2013-01-17: 10 mg via ORAL
  Filled 2013-01-17: qty 1

## 2013-01-17 NOTE — Progress Notes (Signed)
CARDIAC REHAB PHASE I   PRE:  Rate/Rhythm: 122 afib    BP: sitting 119/94    SaO2: 94 RA  MODE:  Ambulation: 300 ft   POST:  Rate/Rhythm: 149 afib    BP: sitting 122/103     SaO2: 95-96 Ra  Pt reluctant to walk due to cramping from fluid change. Agreed. Fairly steady, walked slow. HR increased to 130-140s. Some SOB. Return to recliner. Diastolic BP elevated.  [id number]   Josephina Shih [place] CES, ACSM 01/17/2013 2:56 PM

## 2013-01-17 NOTE — Progress Notes (Signed)
Paged and spoke with Dr. Colon Flattery regarding pt's HR (Afib-110s-140s). MD does not want to restart cardizem gtt at this time in light of pt's low EF and hypervolemia.

## 2013-01-17 NOTE — Progress Notes (Signed)
Advanced Heart Failure Rounding Note   Subjective:    Raymond Pratt is a 62 y.o. male with positive PMH of chronic combined systolic and diastolic CHF (99991111 Echo: EF 20-25%, NYHA class III), A-fib w/ RVR (2010 s/p failed cardioversion), DM, HTN and OSA on CPAP who presents to the [facility] ED with SOB.   Over the last 6 weeks notes that he has had a functional decline and weight has been trending up. Has been seen multiple times at PCP with diuretic adjustments. Presented to the ED yesterday with progressive increased SOB, orthopnea, and DOE. Weight on admission 316 lbs (baseline 261 lbs)  Lasix gtt increased to 20 mg/hr yesterday along with a dose of 5 mg metolazone. Weight down 14 lbs and 24 hr I/O -7.1 liters. Cardizem gtt turned off and now Afib 110-120s. +orthopnea and SOB. Denies CP. INR 1.67. CVP 20  Objective:   Weight Range:  Vital Signs:   Temp:  [97.8 F (36.6 C)-98.8 F (37.1 C)] 98.2 F (36.8 C) (11/12 0411) Pulse Rate:  [91-114] 114 (11/12 0400) BP: (110-128)/(79-95) 117/86 mmHg (11/12 0400) SpO2:  [94 %-99 %] 95 % (11/12 0400) Weight:  [295 lb 13.7 oz (134.2 kg)-298 lb 15.1 oz (135.6 kg)] 298 lb 15.1 oz (135.6 kg) (11/12 0700) Last BM Date: 01/15/13  Weight change: Filed Weights   01/16/13 0328 01/17/13 0455 01/17/13 0700  Weight: 314 lb 13.1 oz (142.8 kg) 295 lb 13.7 oz (134.2 kg) 298 lb 15.1 oz (135.6 kg)    Intake/Output:   Intake/Output Summary (Last 24 hours) at 01/17/13 0724 Last data filed at 01/17/13 0600  Gross per 24 hour  Intake 987.09 ml  Output   8160 ml  Net -7172.91 ml     Physical Exam: General:  Chronically ill appearing. No resp difficulty; sitting in chair HEENT: normal Neck: supple. JVP difficult to assess d/t body habitus, but appears to be elevated . Carotids 2+ bilat; no bruits. No lymphadenopathy or thryomegaly appreciated. Cor: PMI nondisplaced. Regular rate &  Irregular rhythm. No rubs, gallops or murmurs. Lungs: clear Abdomen:  soft, nontender, +++distended. No hepatosplenomegaly. No bruits or masses. Good bowel sounds. Extremities: no cyanosis, clubbing, rash, Woody edema all the way to thighs Neuro: alert & orientedx3, cranial nerves grossly intact. moves all 4 extremities w/o difficulty. Affect pleasant  Telemetry: Afib 110-120s  Labs: Basic Metabolic Panel:  Recent Labs Lab 01/15/13 0441 01/15/13 0508 01/16/13 0854 01/17/13 0445  NA 137 139 136 135  K 3.6 3.4* 3.1* 3.6  CL 96 97 97 92*  CO2 29  --  28 29  GLUCOSE 154* 156* 165* 116*  BUN 27* 32* 22 23  CREATININE 1.13 1.40* 1.01 1.09  CALCIUM 9.3  --  8.8 9.4    Liver Function Tests:  Recent Labs Lab 01/15/13 0441  AST 43*  ALT 28  ALKPHOS 117  BILITOT 1.2  PROT 7.8  ALBUMIN 3.4*   No results found for this basename: LIPASE, AMYLASE,  in the last 168 hours No results found for this basename: AMMONIA,  in the last 168 hours  CBC:  Recent Labs Lab 01/15/13 0441 01/15/13 0508  WBC 7.5  --   NEUTROABS 5.0  --   HGB 14.6 15.6  HCT 43.5 46.0  MCV 96.0  --   PLT 256  --     Cardiac Enzymes: No results found for this basename: CKTOTAL, CKMB, CKMBINDEX, TROPONINI,  in the last 168 hours  BNP: BNP (last 3 results)  Recent  Labs  02/28/12 1534 01/15/13 0444  PROBNP 1405.0* 1239.0*    Imaging: Ir Fluoro Guide Cv Line Right  01/16/2013   CLINICAL DATA:  Heart failure and need for PICC line for long-term IV access needs.  EXAM: RIGHT UPPER EXTREMITY PICC LINE PLACEMENT WITH ULTRASOUND AND FLUOROSCOPIC GUIDANCE  FLUOROSCOPY TIME:  12 seconds.  PROCEDURE: The patient was advised of the possible risks and complications and agreed to undergo the procedure. The patient was then brought to the angiographic suite for the procedure.  The right arm was prepped with chlorhexidine, draped in the usual sterile fashion using maximum barrier technique (cap and mask, sterile gown, sterile gloves, large sterile sheet, hand hygiene and cutaneous  antisepsis) and infiltrated locally with 1% Lidocaine.  Ultrasound demonstrated patency of the right brachial vein, and this was documented with an image. Under real-time ultrasound guidance, this vein was accessed with a 21 gauge micropuncture needle and image documentation was performed. A 0.018 wire was introduced in to the vein. Over this, a 5.0 [place] dual lumen power injectable PICC was advanced to the lower SVC/right atrial junction. Fluoroscopy during the procedure and fluoro spot radiograph confirms appropriate catheter position. The catheter was flushed and covered with a sterile dressing.  Complications: None  IMPRESSION: Successful right arm power injectable PICC line placement with ultrasound and fluoroscopic guidance. The catheter is ready for use.   Electronically Signed   By: Aletta Edouard M.D.   On: 01/16/2013 16:49   Ir US Guide Vasc Access Right  01/16/2013   CLINICAL DATA:  Heart failure and need for PICC line for long-term IV access needs.  EXAM: RIGHT UPPER EXTREMITY PICC LINE PLACEMENT WITH ULTRASOUND AND FLUOROSCOPIC GUIDANCE  FLUOROSCOPY TIME:  12 seconds.  PROCEDURE: The patient was advised of the possible risks and complications and agreed to undergo the procedure. The patient was then brought to the angiographic suite for the procedure.  The right arm was prepped with chlorhexidine, draped in the usual sterile fashion using maximum barrier technique (cap and mask, sterile gown, sterile gloves, large sterile sheet, hand hygiene and cutaneous antisepsis) and infiltrated locally with 1% Lidocaine.  Ultrasound demonstrated patency of the right brachial vein, and this was documented with an image. Under real-time ultrasound guidance, this vein was accessed with a 21 gauge micropuncture needle and image documentation was performed. A 0.018 wire was introduced in to the vein. Over this, a 5.0 [place] dual lumen power injectable PICC was advanced to the lower SVC/right atrial junction.  Fluoroscopy during the procedure and fluoro spot radiograph confirms appropriate catheter position. The catheter was flushed and covered with a sterile dressing.  Complications: None  IMPRESSION: Successful right arm power injectable PICC line placement with ultrasound and fluoroscopic guidance. The catheter is ready for use.   Electronically Signed   By: Aletta Edouard M.D.   On: 01/16/2013 16:49     Medications:     Scheduled Medications: . allopurinol  300 mg Oral Daily  . aspirin  81 mg Oral Daily  . atorvastatin  10 mg Oral q1800  . clobetasol cream   Topical BID  . digoxin  0.25 mg Oral Daily  . enalapril  2.5 mg Oral BID  . enoxaparin (LOVENOX) injection  1 mg/kg Subcutaneous Q12H  . insulin aspart  0-15 Units Subcutaneous TID WC  . insulin aspart  0-5 Units Subcutaneous QHS  . linagliptin  5 mg Oral Daily  . potassium chloride  40 mEq Oral TID  . sodium chloride  3 mL Intravenous Q12H  . warfarin  10 mg Oral ONCE-1800  . Warfarin - Pharmacist Dosing Inpatient   Does not apply q1800    Infusions: . furosemide (LASIX) infusion 20 mg/hr (01/17/13 0640)    PRN Medications: sodium chloride, acetaminophen, ALPRAZolam, nitroGLYCERIN, ondansetron (ZOFRAN) IV, sodium chloride, zolpidem   Assessment:   1) A/C systolic HF 2) NICM  3) Non obstructive CAD 4) DM2 5) Afib, on coumadin 6) OSA, on CPAP 7) HTN   Plan/Discussion:    Brisk diuresis yesterday, will continue lasix gtt at 20 mg/hr with another dose of 5 mg metolazone.   Afib rate now 110-120s, will discuss with Dr. Haroldine Laws about starting Amiodarone gtt.   ECHO EF 20-25%, RV mildly dilated and systolic fx severely reduced.  SBP 110-120s will start spiro 12.5 mg daily and continue to watch Cr. Not on a BB currently because of volume overload.  INR remains subtherapeutic, pharmacy dosing Coumadin. Will continue lovenox.   Will consult CM and [facility].   Length of Stay: 2   Rande Brunt  NP-C 01/17/2013, 7:24 AM  Advanced Heart Failure Team Pager [phone] (M-F; 7a - 4p)  Please contact [facility] for night-coverage after hours (4p -7a ) and weekends on [email or web address]  Patient seen and examined with Junie Bame, NP. We discussed all aspects of the encounter. I agree with the assessment and plan as stated above. Diuresing very well on current regimen. Will continue. Can cut lasix back a bit if needed. Watch renal function and electrolytes. Can follow hematocrit to make sure we are not exceeding capillary refill rate.   Start Toprol 25 bid for AF with RVR. Continue coumadin.   Daniel Bensimhon,MD 8:02 AM

## 2013-01-17 NOTE — Progress Notes (Signed)
Found pt up in chair, off cpap and on room air.  Pt talking w/ RN, RN working w/ pt, no resp distress noted.
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
## 2013-01-17 NOTE — Progress Notes (Signed)
Patient is able to put CPAP mask on and off by himself, 6cm pressure, 3L oxygen bled in, patient is stable and no signs of distress.  RT will continue to monitor.

## 2013-01-18 LAB — GLUCOSE, CAPILLARY
Glucose-Capillary: 127 mg/dL — ABNORMAL HIGH (ref 70–99)
Glucose-Capillary: 104 mg/dL — ABNORMAL HIGH (ref 70–99)
Glucose-Capillary: 126 mg/dL — ABNORMAL HIGH (ref 70–99)

## 2013-01-18 LAB — BASIC METABOLIC PANEL
CO2: 32 mEq/L (ref 19–32)
Calcium: 9.2 mg/dL (ref 8.4–10.5)
Chloride: 84 mEq/L — ABNORMAL LOW (ref 96–112)
Creatinine, Ser: 1.28 mg/dL (ref 0.50–1.35)
GFR calc Af Amer: 68 mL/min — ABNORMAL LOW (ref >90)
Glucose, Bld: 276 mg/dL — ABNORMAL HIGH (ref 70–99)
Potassium: 3.9 mEq/L (ref 3.5–5.1)
Sodium: 126 mEq/L — ABNORMAL LOW (ref 135–145)

## 2013-01-18 LAB — CBC
MCV: 93.9 fL (ref 78.0–100.0)
Platelets: 338 10*3/uL (ref 150–400)
RBC: 4.75 MIL/uL (ref 4.22–5.81)
RDW: 14.5 % (ref 11.5–15.5)
WBC: 9.3 10*3/uL (ref 4.0–10.5)

## 2013-01-18 LAB — PROTIME-INR
INR: 2 — ABNORMAL HIGH (ref 0.00–1.49)
Prothrombin Time: 22.1 seconds — ABNORMAL HIGH (ref 11.6–15.2)

## 2013-01-18 MED ORDER — METOPROLOL SUCCINATE 12.5 MG HALF TABLET
12.5000 mg | ORAL_TABLET | Freq: Once | ORAL | Status: AC
  Administered 2013-01-18: 16:00:00 via ORAL
  Filled 2013-01-18: qty 1

## 2013-01-18 MED ORDER — METOPROLOL SUCCINATE ER 25 MG PO TB24
37.5000 mg | ORAL_TABLET | Freq: Two times a day (BID) | ORAL | Status: DC
  Administered 2013-01-18 – 2013-01-19 (×2): 37.5 mg via ORAL
  Filled 2013-01-18 (×4): qty 1

## 2013-01-18 MED ORDER — WARFARIN SODIUM 5 MG PO TABS
5.0000 mg | ORAL_TABLET | Freq: Once | ORAL | Status: AC
  Administered 2013-01-18: 5 mg via ORAL
  Filled 2013-01-18: qty 1

## 2013-01-18 NOTE — Progress Notes (Signed)
Advanced Heart Failure Rounding Note   Subjective:    Raymond Pratt is a 62 y.o. male with positive PMH of chronic combined systolic and diastolic CHF (99991111 Echo: EF 20-25%, NYHA class III), A-fib w/ RVR (2010 s/p failed cardioversion), DM, HTN and OSA on CPAP who presents to the [facility] ED with SOB.   Over the last 6 weeks notes that he has had a functional decline and weight has been trending up. Has been seen multiple times at PCP with diuretic adjustments. Presented to the ED yesterday with progressive increased SOB, orthopnea, and DOE. Weight on admission 316 lbs (baseline 261 lbs)  Weight down 14 lbs and 24 hr I/O -8 liters. Cr stable. Started Toprol 25 mg BID and spiro 12.5 mg. Ambulating with CR. Afib 100-110 +orthopnea and SOB. Denies CP. INR 2.0, CVP 12. + cramps  Objective:   Weight Range:  Vital Signs:   Temp:  [97.4 F (36.3 C)-99.2 F (37.3 C)] 97.7 F (36.5 C) (11/13 0413) Pulse Rate:  [124-134] 124 (11/12 2307) Resp:  [18-20] 20 (11/12 2307) BP: (101-130)/(53-99) 128/73 mmHg (11/13 0350) SpO2:  [95 %-98 %] 98 % (11/13 0413) Weight:  [284 lb 6.3 oz (129 kg)] 284 lb 6.3 oz (129 kg) (11/13 0500) Last BM Date: 01/17/13  Weight change: Filed Weights   01/17/13 0455 01/17/13 0700 01/18/13 0500  Weight: 295 lb 13.7 oz (134.2 kg) 298 lb 15.1 oz (135.6 kg) 284 lb 6.3 oz (129 kg)    Intake/Output:   Intake/Output Summary (Last 24 hours) at 01/18/13 0728 Last data filed at 01/18/13 0600  Gross per 24 hour  Intake   1960 ml  Output  10050 ml  Net  -8090 ml     Physical Exam: General:  Chronically ill appearing. No resp difficulty; sitting in chair HEENT: normal Neck: supple. JVP difficult to assess d/t body habitus, but appears to be elevated . Carotids 2+ bilat; no bruits. No lymphadenopathy or thryomegaly appreciated. Cor: PMI nondisplaced. Irregular rate & Irregular rhythm. No rubs, gallops or murmurs. Lungs: clear Abdomen: obese soft, nontender, +distended.  No hepatosplenomegaly. No bruits or masses. Good bowel sounds. Extremities: no cyanosis, clubbing, rash, Woody edema; RUE 2L PICC Neuro: alert & orientedx3, cranial nerves grossly intact. moves all 4 extremities w/o difficulty. Affect pleasant  Telemetry: Afib 100-110s  Labs: Basic Metabolic Panel:  Recent Labs Lab 01/15/13 0441 01/15/13 0508 01/16/13 0854 01/17/13 0445 01/17/13 2310 01/18/13 0355  NA 137 139 136 135 126* 126*  K 3.6 3.4* 3.1* 3.6 3.6 3.9  CL 96 97 97 92* 84* 84*  CO2 29  --  28 29 32 32  GLUCOSE 154* 156* 165* 116* 264* 276*  BUN 27* 32* 22 23 28* 28*  CREATININE 1.13 1.40* 1.01 1.09 1.31 1.28  CALCIUM 9.3  --  8.8 9.4 9.4 9.2    Liver Function Tests:  Recent Labs Lab 01/15/13 0441  AST 43*  ALT 28  ALKPHOS 117  BILITOT 1.2  PROT 7.8  ALBUMIN 3.4*   No results found for this basename: LIPASE, AMYLASE,  in the last 168 hours No results found for this basename: AMMONIA,  in the last 168 hours  CBC:  Recent Labs Lab 01/15/13 0441 01/15/13 0508 01/18/13 0355  WBC 7.5  --  9.3  NEUTROABS 5.0  --   --   HGB 14.6 15.6 15.4  HCT 43.5 46.0 44.6  MCV 96.0  --  93.9  PLT 256  --  338  Cardiac Enzymes: No results found for this basename: CKTOTAL, CKMB, CKMBINDEX, TROPONINI,  in the last 168 hours  BNP: BNP (last 3 results)  Recent Labs  02/28/12 1534 01/15/13 0444  PROBNP 1405.0* 1239.0*    Imaging: Ir Fluoro Guide Cv Line Right  01/16/2013   CLINICAL DATA:  Heart failure and need for PICC line for long-term IV access needs.  EXAM: RIGHT UPPER EXTREMITY PICC LINE PLACEMENT WITH ULTRASOUND AND FLUOROSCOPIC GUIDANCE  FLUOROSCOPY TIME:  12 seconds.  PROCEDURE: The patient was advised of the possible risks and complications and agreed to undergo the procedure. The patient was then brought to the angiographic suite for the procedure.  The right arm was prepped with chlorhexidine, draped in the usual sterile fashion using maximum barrier  technique (cap and mask, sterile gown, sterile gloves, large sterile sheet, hand hygiene and cutaneous antisepsis) and infiltrated locally with 1% Lidocaine.  Ultrasound demonstrated patency of the right brachial vein, and this was documented with an image. Under real-time ultrasound guidance, this vein was accessed with a 21 gauge micropuncture needle and image documentation was performed. A 0.018 wire was introduced in to the vein. Over this, a 5.0 [place] dual lumen power injectable PICC was advanced to the lower SVC/right atrial junction. Fluoroscopy during the procedure and fluoro spot radiograph confirms appropriate catheter position. The catheter was flushed and covered with a sterile dressing.  Complications: None  IMPRESSION: Successful right arm power injectable PICC line placement with ultrasound and fluoroscopic guidance. The catheter is ready for use.   Electronically Signed   By: Aletta Edouard M.D.   On: 01/16/2013 16:49   Ir US Guide Vasc Access Right  01/16/2013   CLINICAL DATA:  Heart failure and need for PICC line for long-term IV access needs.  EXAM: RIGHT UPPER EXTREMITY PICC LINE PLACEMENT WITH ULTRASOUND AND FLUOROSCOPIC GUIDANCE  FLUOROSCOPY TIME:  12 seconds.  PROCEDURE: The patient was advised of the possible risks and complications and agreed to undergo the procedure. The patient was then brought to the angiographic suite for the procedure.  The right arm was prepped with chlorhexidine, draped in the usual sterile fashion using maximum barrier technique (cap and mask, sterile gown, sterile gloves, large sterile sheet, hand hygiene and cutaneous antisepsis) and infiltrated locally with 1% Lidocaine.  Ultrasound demonstrated patency of the right brachial vein, and this was documented with an image. Under real-time ultrasound guidance, this vein was accessed with a 21 gauge micropuncture needle and image documentation was performed. A 0.018 wire was introduced in to the vein. Over this, a  5.0 [place] dual lumen power injectable PICC was advanced to the lower SVC/right atrial junction. Fluoroscopy during the procedure and fluoro spot radiograph confirms appropriate catheter position. The catheter was flushed and covered with a sterile dressing.  Complications: None  IMPRESSION: Successful right arm power injectable PICC line placement with ultrasound and fluoroscopic guidance. The catheter is ready for use.   Electronically Signed   By: Aletta Edouard M.D.   On: 01/16/2013 16:49     Medications:     Scheduled Medications: . allopurinol  300 mg Oral Daily  . aspirin EC  81 mg Oral Daily  . atorvastatin  10 mg Oral q1800  . clobetasol cream   Topical BID  . digoxin  0.25 mg Oral Daily  . enalapril  2.5 mg Oral BID  . enoxaparin (LOVENOX) injection  1 mg/kg Subcutaneous Q12H  . insulin aspart  0-15 Units Subcutaneous TID WC  . insulin  aspart  0-5 Units Subcutaneous QHS  . linagliptin  5 mg Oral Daily  . metoprolol succinate  25 mg Oral BID  . potassium chloride  40 mEq Oral TID  . sodium chloride  3 mL Intravenous Q12H  . spironolactone  12.5 mg Oral Daily  . Warfarin - Pharmacist Dosing Inpatient   Does not apply q1800    Infusions: . furosemide (LASIX) infusion 20 mg/hr (01/17/13 1900)    PRN Medications: sodium chloride, acetaminophen, ALPRAZolam, nitroGLYCERIN, ondansetron (ZOFRAN) IV, sodium chloride, zolpidem   Assessment:   1) A/C systolic HF 2) NICM  3) Non obstructive CAD 4) DM2 5) Afib, on coumadin 6) OSA, on CPAP 7) HTN 8) Hyponatremia  Plan/Discussion:    Weight down another 14 lbs (total 32 lbs), however still volume overloaded. Cr and Hct stable will stop metolazone and continue lasix gtt at 20 mg/hr.   Afib rate better controlled on Toprol, will increase to 37.5 mg BID. SBP 120s.  INR now therapeutic, will stop lovenox and continue coumadin. Pharmacy dosing.   Will consult CM and [facility].   Patient agreed to [facility].   Length of  Stay: 3   Rande Brunt NP-C 01/18/2013, 7:28 AM  Advanced Heart Failure Team Pager [phone] (M-F; 7a - 4p)  Please contact [facility] for night-coverage after hours (4p -7a ) and weekends on [email or web address]  Patient seen and examined with Junie Bame, NP. We discussed all aspects of the encounter. I agree with the assessment and plan as stated above.   Volume status improving but still a bit to go. Renal function stable. Continue diuresis. Agree with increasing Toprol for AF. Continue to ambulate with CR.  INR therapeutic.   Kimaya Whitlatch,MD 11:23 AM
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
## 2013-01-18 NOTE — Progress Notes (Signed)
CARDIAC REHAB PHASE I   PRE:  Rate/Rhythm: 111 afib    BP: sitting 147/94    SaO2: 96 RA  MODE:  Ambulation: 350 ft   POST:  Rate/Rhythm: 140s    BP: sitting 99/84     SaO2: 97 RA  Pt frustrated today. Sts he is depressed. Doesn't understand how he got this fluid again. Sts he doesn't eat salt. Able to walk better today, no cramping, but HR still up to high 140s walking. Encouraged another walk later to see if more controlled. Did not seem SOB, he denied SOB. BP much lower after walk. Will f/u. [id number]   Josephina Shih [place] CES, ACSM 01/18/2013 11:52 AM

## 2013-01-19 LAB — GLUCOSE, CAPILLARY
Glucose-Capillary: 169 mg/dL — ABNORMAL HIGH (ref 70–99)
Glucose-Capillary: 98 mg/dL (ref 70–99)
Glucose-Capillary: 117 mg/dL — ABNORMAL HIGH (ref 70–99)

## 2013-01-19 LAB — BASIC METABOLIC PANEL
BUN: 39 mg/dL — ABNORMAL HIGH (ref 6–23)
CO2: 31 mEq/L (ref 19–32)
Glucose, Bld: 112 mg/dL — ABNORMAL HIGH (ref 70–99)
Potassium: 4.4 mEq/L (ref 3.5–5.1)
Sodium: 133 mEq/L — ABNORMAL LOW (ref 135–145)

## 2013-01-19 MED ORDER — TORSEMIDE 20 MG PO TABS
40.0000 mg | ORAL_TABLET | Freq: Two times a day (BID) | ORAL | Status: DC
Start: 2013-01-19 — End: 2013-01-20
  Administered 2013-01-19 – 2013-01-20 (×2): 40 mg via ORAL
  Filled 2013-01-19 (×4): qty 2

## 2013-01-19 MED ORDER — POTASSIUM CHLORIDE CRYS ER 20 MEQ PO TBCR
20.0000 meq | EXTENDED_RELEASE_TABLET | Freq: Two times a day (BID) | ORAL | Status: DC
  Administered 2013-01-19 – 2013-01-20 (×2): 20 meq via ORAL
  Filled 2013-01-19 (×2): qty 1

## 2013-01-19 MED ORDER — METOPROLOL SUCCINATE ER 50 MG PO TB24
50.0000 mg | ORAL_TABLET | Freq: Two times a day (BID) | ORAL | Status: DC
  Administered 2013-01-19 – 2013-01-20 (×2): 50 mg via ORAL
  Filled 2013-01-19 (×3): qty 1

## 2013-01-19 MED ORDER — WARFARIN SODIUM 7.5 MG PO TABS
7.5000 mg | ORAL_TABLET | Freq: Once | ORAL | Status: AC
  Administered 2013-01-19: 7.5 mg via ORAL
  Filled 2013-01-19 (×2): qty 1

## 2013-01-19 NOTE — Progress Notes (Signed)
Patient says he has been on and off CPAP all night.  He did not want me to put it on him, said he would do it himself.

## 2013-01-19 NOTE — Progress Notes (Signed)
Advanced Heart Failure Rounding Note   Subjective:    Raymond Pratt is a 62 y.o. male with positive PMH of chronic combined systolic and diastolic CHF (99991111 Echo: EF 20-25%, NYHA class III), A-fib w/ RVR (2010 s/p failed cardioversion), DM, HTN and OSA on CPAP who presents to the [facility] ED with SOB.   Over the last 6 weeks notes that he has had a functional decline and weight has been trending up. Has been seen multiple times at PCP with diuretic adjustments. Presented to the ED yesterday with progressive increased SOB, orthopnea, and DOE. Weight on admission 316 lbs (baseline 261 lbs)  Weight down another 7 pounds. (now 277)  Breathing better.  Cr rising slightly. 1.1->1.3->1.4. INR 2.0. HR still in low 100s. Toprol has been added. CVP 7  Objective:   Weight Range:  Vital Signs:   Temp:  [97.6 F (36.4 C)-98.5 F (36.9 C)] 97.9 F (36.6 C) (11/14 0415) Resp:  [18-20] 18 (11/13 1555) BP: (95-119)/(70-84) 95/70 mmHg (11/14 0403) SpO2:  [93 %-97 %] 97 % (11/14 0415) Weight:  [126 kg (277 lb 12.5 oz)] 126 kg (277 lb 12.5 oz) (11/14 0631) Last BM Date: 01/17/13  Weight change: Filed Weights   01/17/13 0700 01/18/13 0500 01/19/13 0631  Weight: 135.6 kg (298 lb 15.1 oz) 129 kg (284 lb 6.3 oz) 126 kg (277 lb 12.5 oz)    Intake/Output:   Intake/Output Summary (Last 24 hours) at 01/19/13 0802 Last data filed at 01/19/13 0500  Gross per 24 hour  Intake   1023 ml  Output   5125 ml  Net  -4102 ml     Physical Exam: General:  Chronically ill appearing. No resp difficulty; sitting in chair HEENT: normal Neck: supple. JVP difficult to assess d/t body habitus, but appears to be elevated . Carotids 2+ bilat; no bruits. No lymphadenopathy or thryomegaly appreciated. Cor: PMI nondisplaced. Irregular rate & Irregular rhythm. No rubs, gallops or murmurs. Lungs: clear Abdomen: obese soft, nontender, + mildly distended. No hepatosplenomegaly. No bruits or masses. Good bowel  sounds. Extremities: no cyanosis, clubbing, rash, tr- 1+ Woody edema; RUE 2L PICC Neuro: alert & orientedx3, cranial nerves grossly intact. moves all 4 extremities w/o difficulty. Affect pleasant  Telemetry: Afib 100-110s  Labs: Basic Metabolic Panel:  Recent Labs Lab 01/16/13 0854 01/17/13 0445 01/17/13 2310 01/18/13 0355 01/19/13 0425  NA 136 135 126* 126* 133*  K 3.1* 3.6 3.6 3.9 4.4  CL 97 92* 84* 84* 90*  CO2 28 29 32 32 31  GLUCOSE 165* 116* 264* 276* 112*  BUN 22 23 28* 28* 39*  CREATININE 1.01 1.09 1.31 1.28 1.42*  CALCIUM 8.8 9.4 9.4 9.2 9.3    Liver Function Tests:  Recent Labs Lab 01/15/13 0441  AST 43*  ALT 28  ALKPHOS 117  BILITOT 1.2  PROT 7.8  ALBUMIN 3.4*   No results found for this basename: LIPASE, AMYLASE,  in the last 168 hours No results found for this basename: AMMONIA,  in the last 168 hours  CBC:  Recent Labs Lab 01/15/13 0441 01/15/13 0508 01/18/13 0355  WBC 7.5  --  9.3  NEUTROABS 5.0  --   --   HGB 14.6 15.6 15.4  HCT 43.5 46.0 44.6  MCV 96.0  --  93.9  PLT 256  --  338    Cardiac Enzymes: No results found for this basename: CKTOTAL, CKMB, CKMBINDEX, TROPONINI,  in the last 168 hours  BNP: BNP (last 3 results)  Recent Labs  02/28/12 1534 01/15/13 0444  PROBNP 1405.0* 1239.0*    Imaging: No results found.   Medications:     Scheduled Medications: . allopurinol  300 mg Oral Daily  . aspirin EC  81 mg Oral Daily  . atorvastatin  10 mg Oral q1800  . clobetasol cream   Topical BID  . digoxin  0.25 mg Oral Daily  . enalapril  2.5 mg Oral BID  . insulin aspart  0-15 Units Subcutaneous TID WC  . insulin aspart  0-5 Units Subcutaneous QHS  . linagliptin  5 mg Oral Daily  . metoprolol succinate  37.5 mg Oral BID  . potassium chloride  40 mEq Oral TID  . sodium chloride  3 mL Intravenous Q12H  . spironolactone  12.5 mg Oral Daily  . warfarin  7.5 mg Oral ONCE-1800  . Warfarin - Pharmacist Dosing Inpatient    Does not apply q1800    Infusions: . furosemide (LASIX) infusion 20 mg/hr (01/18/13 1900)    PRN Medications: sodium chloride, acetaminophen, ALPRAZolam, nitroGLYCERIN, ondansetron (ZOFRAN) IV, sodium chloride, zolpidem   Assessment:   1) A/C systolic HF 2) NICM  3) Non obstructive CAD 4) DM2 5) Afib, on coumadin 6) OSA, on CPAP 7) HTN 8) Hyponatremia  Plan/Discussion:    Weight down another 7 lbs (total 39 lbs). CVP looks good. Cr up slightly. Will stop IV lasix and switch back to po demadex. Will try to get home in am.   Afib rate still not well controlled increase Toprol to 50 mg BID.   INR now therapeutic, continue coumadin. Pharmacy dosing.   Patient agreed to [facility] and Paramedicine  Length of Stay: 4   [name] MD  01/19/2013, 8:02 AM  Advanced Heart Failure Team Pager [phone] (M-F; [place])  Please contact [facility] for night-coverage after hours (4p -7a ) and weekends on [email or web address]

## 2013-01-19 NOTE — Progress Notes (Signed)
CARDIAC REHAB PHASE I   PRE:  Rate/Rhythm: 106-119 afib  BP:  Supine:   Sitting: 121/73  Standing:    SaO2: 98%RA  MODE:  Ambulation: 700 ft   POST:  Rate/Rhythm: 130 afib at 350 ft and 700 ft  BP:  Supine:   Sitting: 109/87  Standing:    SaO2: 95-97%RA [id number] Pt wants to go home because he has funeral to go to tomorrow. He walked 700 ft with heart rate to 130. Sats good. Denied palpitations or SOB but stopped to rest due to leg pain. To recliner after walk. Reviewed daily weights and when to call MD. Pt states he has to watch salt, coumadin and diabetes which makes diet difficult. Discussed CRP 2 but he declined due to work schedule. Stated he has CHF booklet at home.   Graylon Good, RN BSN  01/19/2013 9:44 AM
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
## 2013-01-20 ENCOUNTER — Encounter (HOSPITAL_COMMUNITY): Payer: Self-pay | Admitting: Physician Assistant

## 2013-01-20 LAB — BASIC METABOLIC PANEL
BUN: 45 mg/dL — ABNORMAL HIGH (ref 6–23)
Calcium: 9.5 mg/dL (ref 8.4–10.5)
Chloride: 88 mEq/L — ABNORMAL LOW (ref 96–112)
Creatinine, Ser: 1.32 mg/dL (ref 0.50–1.35)
GFR calc non Af Amer: 57 mL/min — ABNORMAL LOW (ref >90)
Glucose, Bld: 121 mg/dL — ABNORMAL HIGH (ref 70–99)
Sodium: 129 mEq/L — ABNORMAL LOW (ref 135–145)

## 2013-01-20 MED ORDER — METOLAZONE 5 MG PO TABS: 5.0000 mg | ORAL_TABLET | Freq: Every day | ORAL | Status: DC | PRN

## 2013-01-20 MED ORDER — POTASSIUM CHLORIDE CRYS ER 20 MEQ PO TBCR: 20.0000 meq | EXTENDED_RELEASE_TABLET | Freq: Two times a day (BID) | ORAL | Status: DC

## 2013-01-20 MED ORDER — ALLOPURINOL 300 MG PO TABS: 300.0000 mg | ORAL_TABLET | Freq: Every day | ORAL | Status: DC

## 2013-01-20 MED ORDER — METOPROLOL SUCCINATE ER 50 MG PO TB24: 50.0000 mg | ORAL_TABLET | Freq: Two times a day (BID) | ORAL | Status: DC

## 2013-01-20 MED ORDER — TORSEMIDE 20 MG PO TABS: 40.0000 mg | ORAL_TABLET | Freq: Two times a day (BID) | ORAL | Status: DC

## 2013-01-20 NOTE — Progress Notes (Signed)
Advanced Heart Failure Rounding Note   Subjective:    Raymond Pratt is a 62 y.o. male with positive PMH of chronic combined systolic and diastolic CHF (99991111 Echo: EF 20-25%, NYHA class III), A-fib w/ RVR (2010 s/p failed cardioversion), DM, HTN and OSA on CPAP who presents to the [facility] ED with SOB.   Over the last 6 weeks notes that he has had a functional decline and weight has been trending up. Has been seen multiple times at PCP with diuretic adjustments. Presented to the ED yesterday with progressive increased SOB, orthopnea, and DOE. Weight on admission 316 lbs (baseline 261 lbs)  Taken of IV lasix yesterday as CVP down and renal function slightly worse. Lopressor increased for chronic AF with RVR, (diltaizem stopped on admit due to low EF)  Weight down another pound. (now 276)  Breathing better.  Anxious to go home. Renal function improved. 1.1->1.3->1.4->1.3 . INR 2.0. HR now100s.  CVP 5-6  Objective:   Weight Range:  Vital Signs:   Temp:  [97.3 F (36.3 C)-98.5 F (36.9 C)] 98.5 F (36.9 C) (11/15 0300) Pulse Rate:  [112-113] 112 (11/14 2104) Resp:  [16-18] 18 (11/15 0509) BP: (98-142)/(73-99) 112/97 mmHg (11/15 0509) SpO2:  [94 %-98 %] 98 % (11/15 0300) Weight:  [125.3 kg (276 lb 3.8 oz)] 125.3 kg (276 lb 3.8 oz) (11/15 0500) Last BM Date: 01/17/13  Weight change: Filed Weights   01/18/13 0500 01/19/13 0631 01/20/13 0500  Weight: 129 kg (284 lb 6.3 oz) 126 kg (277 lb 12.5 oz) 125.3 kg (276 lb 3.8 oz)    Intake/Output:   Intake/Output Summary (Last 24 hours) at 01/20/13 0836 Last data filed at 01/20/13 0300  Gross per 24 hour  Intake   1040 ml  Output   2900 ml  Net  -1860 ml     Physical Exam: General:  Chronically ill appearing. No resp difficulty; sitting in chair HEENT: normal Neck: supple. JVP difficult to assess d/t body habitus, but appears to be elevated . Carotids 2+ bilat; no bruits. No lymphadenopathy or thryomegaly appreciated. Cor: PMI  nondisplaced. Irregular rate & Irregular rhythm. No rubs, gallops or murmurs. Lungs: clear Abdomen: obese soft, nontender, nondistended. No hepatosplenomegaly. No bruits or masses. Good bowel sounds. Extremities: no cyanosis, clubbing, rash, tr Woody edema; RUE 2L PICC Neuro: alert & orientedx3, cranial nerves grossly intact. moves all 4 extremities w/o difficulty. Affect pleasant  Telemetry: Afib 95-110s  Labs: Basic Metabolic Panel:  Recent Labs Lab 01/17/13 0445 01/17/13 2310 01/18/13 0355 01/19/13 0425 01/20/13 0515  NA 135 126* 126* 133* 129*  K 3.6 3.6 3.9 4.4 4.5  CL 92* 84* 84* 90* 88*  CO2 29 32 32 31 31  GLUCOSE 116* 264* 276* 112* 121*  BUN 23 28* 28* 39* 45*  CREATININE 1.09 1.31 1.28 1.42* 1.32  CALCIUM 9.4 9.4 9.2 9.3 9.5    Liver Function Tests:  Recent Labs Lab 01/15/13 0441  AST 43*  ALT 28  ALKPHOS 117  BILITOT 1.2  PROT 7.8  ALBUMIN 3.4*   No results found for this basename: LIPASE, AMYLASE,  in the last 168 hours No results found for this basename: AMMONIA,  in the last 168 hours  CBC:  Recent Labs Lab 01/15/13 0441 01/15/13 0508 01/18/13 0355  WBC 7.5  --  9.3  NEUTROABS 5.0  --   --   HGB 14.6 15.6 15.4  HCT 43.5 46.0 44.6  MCV 96.0  --  93.9  PLT 256  --  338    Cardiac Enzymes: No results found for this basename: CKTOTAL, CKMB, CKMBINDEX, TROPONINI,  in the last 168 hours  BNP: BNP (last 3 results)  Recent Labs  02/28/12 1534 01/15/13 0444  PROBNP 1405.0* 1239.0*    Imaging: No results found.   Medications:     Scheduled Medications: . allopurinol  300 mg Oral Daily  . aspirin EC  81 mg Oral Daily  . atorvastatin  10 mg Oral q1800  . clobetasol cream   Topical BID  . digoxin  0.25 mg Oral Daily  . enalapril  2.5 mg Oral BID  . insulin aspart  0-15 Units Subcutaneous TID WC  . insulin aspart  0-5 Units Subcutaneous QHS  . linagliptin  5 mg Oral Daily  . metoprolol succinate  50 mg Oral BID  . potassium  chloride  20 mEq Oral BID  . sodium chloride  3 mL Intravenous Q12H  . spironolactone  12.5 mg Oral Daily  . torsemide  40 mg Oral BID  . Warfarin - Pharmacist Dosing Inpatient   Does not apply q1800    Infusions:    PRN Medications: sodium chloride, acetaminophen, ALPRAZolam, nitroGLYCERIN, ondansetron (ZOFRAN) IV, sodium chloride, zolpidem   Assessment:   1) A/C systolic HF 2) NICM  3) Non obstructive CAD 4) DM2 5) Afib, on coumadin 6) OSA, on CPAP 7) HTN 8) Hyponatremia  Plan/Discussion:    Weight down another pound (total 40 lbs). CVP looks good. Cr stable.   Afib improved control on Toprol 50 mg BID.   INR  therapeutic, continue coumadin.   Patient agreed to [facility] and [place] today on torsemide 40 bid. Toprol 50 bid Stop diltiazem Continue digoxin  Take metolazone 5 mg and KCL 18meq for weight 280 or greater. F/u HF Clinic 1 week from Monday (scheduled)  Please cc Dr. Shon Baton (PCP) on d/c summary. He will f/u with him as well.   [name] 8:49 AM  Advanced Heart Failure Team Pager [phone] (M-F; 7a - 4p)  Please contact [facility] for night-coverage after hours (4p -7a ) and weekends on [email or web address]

## 2013-01-20 NOTE — Progress Notes (Signed)
   CARE MANAGEMENT NOTE 01/20/2013  Patient:  Raymond Pratt, Raymond Pratt   Account Number:  [id number]  Date Initiated:  01/15/2013  Documentation initiated by:  Elissa Hefty  Subjective/Objective Assessment:   adm w at fib     Action/Plan:   lives alone, pcp dr Shon Baton   Anticipated DC Date:     Anticipated DC Plan:  [place]  CM consult      [place]   Choice offered to / List presented to:  C-1 Patient        [place] arranged  HH-1 RN      [place].   Status of service:  Completed, signed off Medicare Important Message given?   (If response is "NO", the following Medicare IM given date fields will be blank) Date Medicare IM given:   Date Additional Medicare IM given:    Discharge Disposition:  [place]  Per UR Regulation:  Reviewed for med. necessity/level of care/duration of stay  If discussed at [place] of Stay Meetings, dates discussed:    Comments:  01/20/13 12:30 Cm spoke with pt who accepts [facility].  Pt chooses [facility] for [facility].  Address and contact numbers verified.  Referral faxed to [facility] for [facility].  No other CM needs were communicated.  Mariane Masters, BSN, [place].  11/13   1121 debbie dowell rn,bsn spoke w pt again today. he states that he does not want [facility]. will let md know that he does not want [facility].  11/12  1523 debbie dowell rn,bsn pt really does not want [facility]. he states he will follow up w md and hopes to return to work. will be available if pt changes his mind. will cont to follow.  11/12 1156a debbie dowell rn,bsn spoke w pt and he would like to get back to work. if he is not able to return home immediately after disch he would be willing for [facility]hc nse to ck on him. he seems to have good understanding of heart failure and how to manage. ref to donna w [facility] for hhrn for chf. no pref to hhc agency by pt as long as they accept bcbs ins. pt plans to ck on  disability in 12-14 to start process incase he can't work.

## 2013-01-20 NOTE — Discharge Summary (Signed)
Patient seen and examined with Richardson Dopp, PA. We discussed all aspects of the encounter. I agree with the assessment and plan as stated above. He is ready for d/c. See my rounding note for other details.   Lonnell Chaput,MD 2:35 PM

## 2013-01-20 NOTE — Progress Notes (Signed)
Pt. Put himself on CPAP. No signs of respiratory distress and sats are fine. Pt also states that he takes the CPAP off from time to time to sit up on side of bed because he gets cramps in leg. RT advised would continue to monitor.
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
## 2013-01-20 NOTE — Progress Notes (Signed)
Pt declined walk at this time as he had just had PICC line removed.  He was dressed and ready for d/c.  He had no further follow-up questions regarding his d/c education. Alberteen Sam, MA, ACSM RCEP

## 2013-01-24 ENCOUNTER — Telehealth: Payer: Self-pay | Admitting: Cardiology

## 2013-01-24 NOTE — Telephone Encounter (Signed)
New problem   [facility] has been unable to reach pt by phone and house visit. Pt hasn't returned their calls. They will be discharging the pt because of this. Please advise.

## 2013-01-25 ENCOUNTER — Encounter: Payer: BC Managed Care – PPO | Admitting: Cardiology

## 2013-01-25 ENCOUNTER — Encounter: Payer: Self-pay | Admitting: Internal Medicine

## 2013-01-25 NOTE — Telephone Encounter (Signed)
Returned call to [facility] at [facility] this patient is not Dr.Jordan's patient.Advised Dr.Bensimhon is patient's cardiologist.

## 2013-01-29 ENCOUNTER — Ambulatory Visit (HOSPITAL_COMMUNITY)
Admission: RE | Admit: 2013-01-29 | Discharge: 2013-01-29 | Disposition: A | Discharge status: Home / Self Care | Payer: BC Managed Care – PPO | Source: Ambulatory Visit | Attending: Internal Medicine | Admitting: Internal Medicine

## 2013-01-29 VITALS — BP 124/74 | HR 78 | Wt 280.5 lb

## 2013-01-29 DIAGNOSIS — G4733 Obstructive sleep apnea (adult) (pediatric): Secondary | ICD-10-CM | POA: Insufficient documentation

## 2013-01-29 DIAGNOSIS — E119 Type 2 diabetes mellitus without complications: Secondary | ICD-10-CM | POA: Insufficient documentation

## 2013-01-29 DIAGNOSIS — I509 Heart failure, unspecified: Secondary | ICD-10-CM | POA: Insufficient documentation

## 2013-01-29 DIAGNOSIS — I2510 Atherosclerotic heart disease of native coronary artery without angina pectoris: Secondary | ICD-10-CM | POA: Insufficient documentation

## 2013-01-29 DIAGNOSIS — I10 Essential (primary) hypertension: Secondary | ICD-10-CM | POA: Insufficient documentation

## 2013-01-29 DIAGNOSIS — I5042 Chronic combined systolic (congestive) and diastolic (congestive) heart failure: Secondary | ICD-10-CM

## 2013-01-29 DIAGNOSIS — I5022 Chronic systolic (congestive) heart failure: Secondary | ICD-10-CM

## 2013-01-29 DIAGNOSIS — I428 Other cardiomyopathies: Secondary | ICD-10-CM | POA: Insufficient documentation

## 2013-01-29 LAB — BASIC METABOLIC PANEL
BUN: 38 mg/dL — ABNORMAL HIGH (ref 6–23)
CO2: 31 mEq/L (ref 19–32)
Chloride: 91 mEq/L — ABNORMAL LOW (ref 96–112)
GFR calc Af Amer: 75 mL/min — ABNORMAL LOW (ref >90)
Glucose, Bld: 118 mg/dL — ABNORMAL HIGH (ref 70–99)
Potassium: 4 mEq/L (ref 3.5–5.1)

## 2013-01-29 MED ORDER — SPIRONOLACTONE 25 MG PO TABS: 12.5000 mg | ORAL_TABLET | Freq: Every day | ORAL | Status: DC

## 2013-01-29 NOTE — Patient Instructions (Addendum)
Go back to taking torsemide 40 mg twice a day.   Remember to take 5 mg metolazone if weight is equal to or greater than 280 lbs. Stop taking your potassium even if you take metolazone.   START NEW MEDICATION:  SPIRONOLACTONE 25 MG; TAKE 1/2 TAB DAILY  Get labs at Dr. Keane Police on December 4th.  Your physician recommends that you schedule a follow-up appointment in: 2-3 weeks  Do the following things EVERYDAY: 1) Weigh yourself in the morning before breakfast. Write it down and keep it in a log. 2) Take your medicines as prescribed 3) Eat low salt foods-Limit salt (sodium) to 2000 mg per day.  4) Stay as active as you can everyday 5) Limit all fluids for the day to less than 2 liters 6)
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
## 2013-02-01 DIAGNOSIS — I5042 Chronic combined systolic (congestive) and diastolic (congestive) heart failure: Secondary | ICD-10-CM | POA: Insufficient documentation

## 2013-02-16 ENCOUNTER — Ambulatory Visit (HOSPITAL_COMMUNITY)
Admission: RE | Admit: 2013-02-16 | Discharge: 2013-02-16 | Disposition: A | Discharge status: Home / Self Care | Payer: BC Managed Care – PPO | Source: Ambulatory Visit | Attending: Internal Medicine | Admitting: Internal Medicine

## 2013-02-16 VITALS — BP 130/80 | HR 93 | Wt 294.0 lb

## 2013-02-16 DIAGNOSIS — I5042 Chronic combined systolic (congestive) and diastolic (congestive) heart failure: Secondary | ICD-10-CM

## 2013-02-16 DIAGNOSIS — G4733 Obstructive sleep apnea (adult) (pediatric): Secondary | ICD-10-CM

## 2013-02-16 DIAGNOSIS — I10 Essential (primary) hypertension: Secondary | ICD-10-CM

## 2013-02-16 MED ORDER — ENALAPRIL MALEATE 2.5 MG PO TABS: 2.5000 mg | ORAL_TABLET | Freq: Two times a day (BID) | ORAL | Status: DC

## 2013-02-16 MED ORDER — SPIRONOLACTONE 25 MG PO TABS: 25.0000 mg | ORAL_TABLET | Freq: Every day | ORAL | Status: AC

## 2013-02-16 MED ORDER — TORSEMIDE 20 MG PO TABS: 60.0000 mg | ORAL_TABLET | Freq: Two times a day (BID) | ORAL | Status: DC
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
## 2013-02-16 NOTE — Patient Instructions (Signed)
Take spironolactone 25 mg daily  Take 60 mg torsemide twice a day  Do the following things EVERYDAY: 1) Weigh yourself in the morning before breakfast. Write it down and keep it in a log. 2) Take your medicines as prescribed 3) Eat low salt foods-Limit salt (sodium) to 2000 mg per day.  4) Stay as active as you can everyday 5) Limit all fluids for the day to less than 2 liters  Follow up in 3 weeks.

## 2013-03-07 ENCOUNTER — Encounter: Payer: Self-pay | Admitting: Licensed Clinical Social Worker

## 2013-03-07 ENCOUNTER — Ambulatory Visit (HOSPITAL_COMMUNITY)
Admission: RE | Admit: 2013-03-07 | Discharge: 2013-03-07 | Disposition: A | Discharge status: Home / Self Care | Payer: BC Managed Care – PPO | Source: Ambulatory Visit | Attending: Internal Medicine | Admitting: Internal Medicine

## 2013-03-07 VITALS — BP 104/66 | HR 98 | Wt 280.0 lb

## 2013-03-07 DIAGNOSIS — I4891 Unspecified atrial fibrillation: Secondary | ICD-10-CM | POA: Insufficient documentation

## 2013-03-07 DIAGNOSIS — I10 Essential (primary) hypertension: Secondary | ICD-10-CM

## 2013-03-07 DIAGNOSIS — G4733 Obstructive sleep apnea (adult) (pediatric): Secondary | ICD-10-CM

## 2013-03-07 DIAGNOSIS — I5042 Chronic combined systolic (congestive) and diastolic (congestive) heart failure: Secondary | ICD-10-CM | POA: Insufficient documentation

## 2013-03-07 DIAGNOSIS — Z7901 Long term (current) use of anticoagulants: Secondary | ICD-10-CM

## 2013-03-07 LAB — BASIC METABOLIC PANEL
BUN: 41 mg/dL — ABNORMAL HIGH (ref 6–23)
CO2: 28 mEq/L (ref 19–32)
Calcium: 9 mg/dL (ref 8.4–10.5)
Chloride: 92 mEq/L — ABNORMAL LOW (ref 96–112)
Creatinine, Ser: 1.07 mg/dL (ref 0.50–1.35)
Glucose, Bld: 178 mg/dL — ABNORMAL HIGH (ref 70–99)
Potassium: 3.9 mEq/L (ref 3.7–5.3)
Sodium: 136 mEq/L — ABNORMAL LOW (ref 137–147)

## 2013-03-07 LAB — PROTIME-INR: Prothrombin Time: 17.5 seconds — ABNORMAL HIGH (ref 11.6–15.2)
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
## 2013-03-07 NOTE — Progress Notes (Signed)
CSW met with patient in the clinic to assess insurance and disability. Patient reports that he has worked in [employer] for many years with the same company. He states that he was on workers compensation for almost 5 years due to an eye injury back in 2008. He returned to work and recently has had increasing health issues and has missed work on and off over the past year. He states that he applied for Disability back in November and is considering early retirement in September 2015 due to his health. He expressed frusration with the lengthy disability process but denies any need for assistance at this time.  He currently has health insurance through the [place] and is hopeful for a reduced premium in January 2015 due to reduced annual income in 2014. Patient spoke of supportive daughter and [employer]. He states "I don't know how anyone gets through stress without family and faith". CSW provided support and encouraged patient to continue with disability process and seek assistance if needed. Patient appears to have good coping skills and will follow up with CSW if further needs arise. Raquel Sarna, [place]

## 2013-03-07 NOTE — Addendum Note (Signed)
Encounter addended by: Kerry Dory, CMA on: 03/07/2013 11:03 AM<BR>     Documentation filed: Orders

## 2013-03-07 NOTE — Patient Instructions (Signed)
Change your metolazone to 5 mg every Monday and Friday. Do not take this Friday. Call if weight is going up.  Continue to take all other medications as prescribed.  Will get labs and fax to Dr. Virgina Jock  Follow up in 1 month  Continue to exercise as much as you can and watch portion control  Do the following things EVERYDAY: 1) Weigh yourself in the morning before breakfast. Write it down and keep it in a log. 2) Take your medicines as prescribed 3) Eat low salt foods-Limit salt (sodium) to 2000 mg per day.  4) Stay as active as you can everyday 5) Limit all fluids for the day to less than 2 liters 6)

## 2013-03-24 ENCOUNTER — Other Ambulatory Visit: Payer: Self-pay | Admitting: Adult Health

## 2013-04-05 ENCOUNTER — Encounter (HOSPITAL_COMMUNITY): Payer: BC Managed Care – PPO

## 2013-08-10 ENCOUNTER — Encounter: Payer: Self-pay | Admitting: Internal Medicine

## 2013-08-14 ENCOUNTER — Inpatient Hospital Stay (HOSPITAL_COMMUNITY): Admission: RE | Admit: 2013-08-14 | Payer: BC Managed Care – PPO | Source: Ambulatory Visit

## 2013-08-22 ENCOUNTER — Encounter: Payer: Self-pay | Admitting: Internal Medicine

## 2013-12-28 ENCOUNTER — Inpatient Hospital Stay (HOSPITAL_COMMUNITY)
Admission: AD | Admit: 2013-12-28 | Discharge: 2014-01-07 | DRG: 291 | Disposition: A | Discharge status: Home Health | Payer: BC Managed Care – PPO | Source: Ambulatory Visit | Attending: Internal Medicine | Admitting: Internal Medicine

## 2013-12-28 ENCOUNTER — Encounter (HOSPITAL_COMMUNITY): Payer: Self-pay | Admitting: *Deleted

## 2013-12-28 ENCOUNTER — Inpatient Hospital Stay (HOSPITAL_COMMUNITY): Payer: BC Managed Care – PPO

## 2013-12-28 DIAGNOSIS — I429 Cardiomyopathy, unspecified: Secondary | ICD-10-CM | POA: Diagnosis present

## 2013-12-28 DIAGNOSIS — I129 Hypertensive chronic kidney disease with stage 1 through stage 4 chronic kidney disease, or unspecified chronic kidney disease: Secondary | ICD-10-CM | POA: Diagnosis present

## 2013-12-28 DIAGNOSIS — K761 Chronic passive congestion of liver: Secondary | ICD-10-CM | POA: Diagnosis present

## 2013-12-28 DIAGNOSIS — N179 Acute kidney failure, unspecified: Secondary | ICD-10-CM | POA: Diagnosis present

## 2013-12-28 DIAGNOSIS — I959 Hypotension, unspecified: Secondary | ICD-10-CM | POA: Diagnosis not present

## 2013-12-28 DIAGNOSIS — I428 Other cardiomyopathies: Secondary | ICD-10-CM | POA: Diagnosis present

## 2013-12-28 DIAGNOSIS — Z79899 Other long term (current) drug therapy: Secondary | ICD-10-CM | POA: Diagnosis not present

## 2013-12-28 DIAGNOSIS — E871 Hypo-osmolality and hyponatremia: Secondary | ICD-10-CM | POA: Diagnosis present

## 2013-12-28 DIAGNOSIS — K7200 Acute and subacute hepatic failure without coma: Secondary | ICD-10-CM | POA: Diagnosis present

## 2013-12-28 DIAGNOSIS — B029 Zoster without complications: Secondary | ICD-10-CM | POA: Diagnosis present

## 2013-12-28 DIAGNOSIS — I10 Essential (primary) hypertension: Secondary | ICD-10-CM | POA: Diagnosis present

## 2013-12-28 DIAGNOSIS — F419 Anxiety disorder, unspecified: Secondary | ICD-10-CM | POA: Diagnosis present

## 2013-12-28 DIAGNOSIS — Z7901 Long term (current) use of anticoagulants: Secondary | ICD-10-CM

## 2013-12-28 DIAGNOSIS — Z7982 Long term (current) use of aspirin: Secondary | ICD-10-CM

## 2013-12-28 DIAGNOSIS — E785 Hyperlipidemia, unspecified: Secondary | ICD-10-CM | POA: Diagnosis present

## 2013-12-28 DIAGNOSIS — R059 Cough, unspecified: Secondary | ICD-10-CM

## 2013-12-28 DIAGNOSIS — R911 Solitary pulmonary nodule: Secondary | ICD-10-CM | POA: Diagnosis present

## 2013-12-28 DIAGNOSIS — R21 Rash and other nonspecific skin eruption: Secondary | ICD-10-CM | POA: Diagnosis present

## 2013-12-28 DIAGNOSIS — F430 Acute stress reaction: Secondary | ICD-10-CM | POA: Diagnosis present

## 2013-12-28 DIAGNOSIS — E876 Hypokalemia: Secondary | ICD-10-CM | POA: Diagnosis present

## 2013-12-28 DIAGNOSIS — I2781 Cor pulmonale (chronic): Secondary | ICD-10-CM | POA: Diagnosis present

## 2013-12-28 DIAGNOSIS — E872 Acidosis: Secondary | ICD-10-CM | POA: Diagnosis present

## 2013-12-28 DIAGNOSIS — I5043 Acute on chronic combined systolic (congestive) and diastolic (congestive) heart failure: Secondary | ICD-10-CM | POA: Diagnosis present

## 2013-12-28 DIAGNOSIS — I482 Chronic atrial fibrillation, unspecified: Secondary | ICD-10-CM

## 2013-12-28 DIAGNOSIS — N189 Chronic kidney disease, unspecified: Secondary | ICD-10-CM | POA: Diagnosis present

## 2013-12-28 DIAGNOSIS — E119 Type 2 diabetes mellitus without complications: Secondary | ICD-10-CM | POA: Diagnosis present

## 2013-12-28 DIAGNOSIS — K7290 Hepatic failure, unspecified without coma: Secondary | ICD-10-CM | POA: Diagnosis not present

## 2013-12-28 DIAGNOSIS — G4733 Obstructive sleep apnea (adult) (pediatric): Secondary | ICD-10-CM | POA: Diagnosis present

## 2013-12-28 DIAGNOSIS — E1169 Type 2 diabetes mellitus with other specified complication: Secondary | ICD-10-CM | POA: Diagnosis present

## 2013-12-28 DIAGNOSIS — R002 Palpitations: Secondary | ICD-10-CM | POA: Diagnosis present

## 2013-12-28 DIAGNOSIS — Z6841 Body Mass Index (BMI) 40.0 and over, adult: Secondary | ICD-10-CM

## 2013-12-28 DIAGNOSIS — L299 Pruritus, unspecified: Secondary | ICD-10-CM | POA: Diagnosis present

## 2013-12-28 DIAGNOSIS — I481 Persistent atrial fibrillation: Secondary | ICD-10-CM

## 2013-12-28 DIAGNOSIS — I272 Other secondary pulmonary hypertension: Secondary | ICD-10-CM | POA: Diagnosis present

## 2013-12-28 DIAGNOSIS — E46 Unspecified protein-calorie malnutrition: Secondary | ICD-10-CM | POA: Diagnosis present

## 2013-12-28 DIAGNOSIS — I2510 Atherosclerotic heart disease of native coronary artery without angina pectoris: Secondary | ICD-10-CM | POA: Diagnosis present

## 2013-12-28 DIAGNOSIS — K759 Inflammatory liver disease, unspecified: Secondary | ICD-10-CM | POA: Diagnosis present

## 2013-12-28 DIAGNOSIS — E1121 Type 2 diabetes mellitus with diabetic nephropathy: Secondary | ICD-10-CM

## 2013-12-28 DIAGNOSIS — R05 Cough: Secondary | ICD-10-CM

## 2013-12-28 DIAGNOSIS — Z8249 Family history of ischemic heart disease and other diseases of the circulatory system: Secondary | ICD-10-CM | POA: Diagnosis not present

## 2013-12-28 DIAGNOSIS — I509 Heart failure, unspecified: Secondary | ICD-10-CM

## 2013-12-28 DIAGNOSIS — Z23 Encounter for immunization: Secondary | ICD-10-CM | POA: Diagnosis not present

## 2013-12-28 DIAGNOSIS — I4819 Other persistent atrial fibrillation: Secondary | ICD-10-CM

## 2013-12-28 LAB — POCT I-STAT 3, ART BLOOD GAS (G3+)
Acid-Base Excess: 7 mmol/L — ABNORMAL HIGH (ref 0.0–2.0)
Bicarbonate: 30.6 mEq/L — ABNORMAL HIGH (ref 20.0–24.0)
O2 Saturation: 98 %
PH ART: 7.507 — AB (ref 7.350–7.450)
PO2 ART: 102 mmHg — AB (ref 80.0–100.0)
Patient temperature: 97.9
TCO2: 32 mmol/L (ref 0–100)
pCO2 arterial: 38.5 mmHg (ref 35.0–45.0)

## 2013-12-28 LAB — MRSA PCR SCREENING: MRSA by PCR: NEGATIVE

## 2013-12-28 LAB — LIPASE, BLOOD: Lipase: 53 U/L (ref 11–59)

## 2013-12-28 LAB — PRO B NATRIURETIC PEPTIDE: PRO B NATRI PEPTIDE: 2624 pg/mL — AB (ref 0–125)

## 2013-12-28 LAB — CARBOXYHEMOGLOBIN
CARBOXYHEMOGLOBIN: 1 % (ref 0.5–1.5)
Methemoglobin: 0.9 % (ref 0.0–1.5)
O2 SAT: 53.5 %
TOTAL HEMOGLOBIN: 15.2 g/dL (ref 13.5–18.0)

## 2013-12-28 LAB — HEPATIC FUNCTION PANEL
ALT: 449 U/L — ABNORMAL HIGH (ref 0–53)
AST: 732 U/L — ABNORMAL HIGH (ref 0–37)
Albumin: 3 g/dL — ABNORMAL LOW (ref 3.5–5.2)
Alkaline Phosphatase: 118 U/L — ABNORMAL HIGH (ref 39–117)
BILIRUBIN DIRECT: 1.9 mg/dL — AB (ref 0.0–0.3)
BILIRUBIN TOTAL: 3.2 mg/dL — AB (ref 0.3–1.2)
Indirect Bilirubin: 1.3 mg/dL — ABNORMAL HIGH (ref 0.3–0.9)
Total Protein: 7.1 g/dL (ref 6.0–8.3)

## 2013-12-28 LAB — LACTIC ACID, PLASMA: Lactic Acid, Venous: 4.6 mmol/L — ABNORMAL HIGH (ref 0.5–2.2)

## 2013-12-28 LAB — PROTIME-INR
INR: 3.52 — ABNORMAL HIGH (ref 0.00–1.49)
Prothrombin Time: 35.5 seconds — ABNORMAL HIGH (ref 11.6–15.2)

## 2013-12-28 LAB — GLUCOSE, CAPILLARY
GLUCOSE-CAPILLARY: 75 mg/dL (ref 70–99)
GLUCOSE-CAPILLARY: 81 mg/dL (ref 70–99)
GLUCOSE-CAPILLARY: 120 mg/dL — AB (ref 70–99)
Glucose-Capillary: 77 mg/dL (ref 70–99)
Glucose-Capillary: 86 mg/dL (ref 70–99)

## 2013-12-28 LAB — AMYLASE: Amylase: 59 U/L (ref 0–105)

## 2013-12-28 LAB — TRIGLYCERIDES: Triglycerides: 53 mg/dL (ref <150)

## 2013-12-28 LAB — DIGOXIN LEVEL: Digoxin Level: <0.3 ng/mL — ABNORMAL LOW (ref 0.8–2.0)

## 2013-12-28 LAB — AMMONIA: Ammonia: 10 umol/L — ABNORMAL LOW (ref 11–60)

## 2013-12-28 MED ORDER — MILRINONE IN DEXTROSE 20 MG/100ML IV SOLN
0.2500 ug/kg/min | INTRAVENOUS | Status: DC
  Administered 2013-12-28: 0.25 ug/kg/min via INTRAVENOUS
  Filled 2013-12-28: qty 100

## 2013-12-28 MED ORDER — METOPROLOL SUCCINATE ER 50 MG PO TB24
50.0000 mg | ORAL_TABLET | Freq: Two times a day (BID) | ORAL | Status: DC
  Administered 2013-12-28 – 2013-12-30 (×5): 50 mg via ORAL
  Filled 2013-12-28 (×7): qty 1

## 2013-12-28 MED ORDER — AMIODARONE HCL IN DEXTROSE 360-4.14 MG/200ML-% IV SOLN
60.0000 mg/h | INTRAVENOUS | Status: AC
  Administered 2013-12-28 (×2): 60 mg/h via INTRAVENOUS
  Filled 2013-12-28 (×2): qty 200

## 2013-12-28 MED ORDER — SODIUM CHLORIDE 0.9 % IJ SOLN
3.0000 mL | Freq: Two times a day (BID) | INTRAMUSCULAR | Status: DC
Start: 2013-12-28 — End: 2014-01-07
  Administered 2013-12-28 – 2013-12-30 (×6): 3 mL via INTRAVENOUS
  Administered 2013-12-31: 12:00:00 via INTRAVENOUS
  Administered 2014-01-02 – 2014-01-06 (×6): 3 mL via INTRAVENOUS

## 2013-12-28 MED ORDER — SODIUM CHLORIDE 0.9 % IJ SOLN
10.0000 mL | INTRAMUSCULAR | Status: DC | PRN
  Administered 2013-12-28: 30 mL

## 2013-12-28 MED ORDER — AMIODARONE LOAD VIA INFUSION
150.0000 mg | Freq: Once | INTRAVENOUS | Status: AC
  Administered 2013-12-28: 150 mg via INTRAVENOUS
  Filled 2013-12-28: qty 83.34

## 2013-12-28 MED ORDER — IOHEXOL 300 MG/ML  SOLN
25.0000 mL | INTRAMUSCULAR | Status: AC
Start: 2013-12-28 — End: 2013-12-28
  Administered 2013-12-28: 25 mL via ORAL

## 2013-12-28 MED ORDER — MILRINONE IN DEXTROSE 20 MG/100ML IV SOLN
0.3750 ug/kg/min | INTRAVENOUS | Status: DC
  Administered 2013-12-28 – 2013-12-29 (×4): 0.25 ug/kg/min via INTRAVENOUS
  Administered 2013-12-30: 0.375 ug/kg/min via INTRAVENOUS
  Administered 2013-12-30: 0.25 ug/kg/min via INTRAVENOUS
  Administered 2013-12-31 – 2014-01-03 (×12): 0.375 ug/kg/min via INTRAVENOUS
  Filled 2013-12-28 (×18): qty 100

## 2013-12-28 MED ORDER — SODIUM CHLORIDE 0.9 % IV SOLN
INTRAVENOUS | Status: DC
  Administered 2013-12-29: 08:00:00 via INTRAVENOUS

## 2013-12-28 MED ORDER — ASPIRIN EC 81 MG PO TBEC
81.0000 mg | DELAYED_RELEASE_TABLET | Freq: Every day | ORAL | Status: DC
  Administered 2013-12-28 – 2014-01-07 (×11): 81 mg via ORAL
  Filled 2013-12-28 (×12): qty 1

## 2013-12-28 MED ORDER — AMIODARONE HCL IN DEXTROSE 360-4.14 MG/200ML-% IV SOLN
30.0000 mg/h | INTRAVENOUS | Status: DC
  Administered 2013-12-28 – 2013-12-31 (×6): 30 mg/h via INTRAVENOUS
  Filled 2013-12-28 (×14): qty 200

## 2013-12-28 MED ORDER — ONDANSETRON HCL 4 MG/2ML IJ SOLN
4.0000 mg | Freq: Four times a day (QID) | INTRAMUSCULAR | Status: DC | PRN
Start: 2013-12-28 — End: 2014-01-07

## 2013-12-28 MED ORDER — ONDANSETRON HCL 4 MG PO TABS: 4.0000 mg | ORAL_TABLET | Freq: Four times a day (QID) | ORAL | Status: DC | PRN

## 2013-12-28 MED ORDER — DILTIAZEM HCL 25 MG/5ML IV SOLN
5.0000 mg | Freq: Once | INTRAVENOUS | Status: DC
  Filled 2013-12-28: qty 5

## 2013-12-28 MED ORDER — CETYLPYRIDINIUM CHLORIDE 0.05 % MT LIQD
7.0000 mL | Freq: Two times a day (BID) | OROMUCOSAL | Status: DC
  Administered 2013-12-28 – 2014-01-04 (×14): 7 mL via OROMUCOSAL

## 2013-12-28 MED ORDER — FUROSEMIDE 10 MG/ML IJ SOLN
80.0000 mg | Freq: Two times a day (BID) | INTRAMUSCULAR | Status: DC
  Administered 2013-12-28: 80 mg via INTRAVENOUS
  Filled 2013-12-28 (×3): qty 8

## 2013-12-28 MED ORDER — FUROSEMIDE 10 MG/ML IJ SOLN
80.0000 mg | Freq: Once | INTRAMUSCULAR | Status: AC
  Administered 2013-12-28: 80 mg via INTRAVENOUS
  Filled 2013-12-28: qty 8

## 2013-12-28 MED ORDER — POTASSIUM CHLORIDE CRYS ER 20 MEQ PO TBCR
20.0000 meq | EXTENDED_RELEASE_TABLET | Freq: Two times a day (BID) | ORAL | Status: DC
Start: 2013-12-28 — End: 2014-01-01
  Administered 2013-12-28 – 2013-12-31 (×8): 20 meq via ORAL
  Filled 2013-12-28 (×13): qty 1

## 2013-12-28 MED ORDER — WARFARIN - PHARMACIST DOSING INPATIENT
Freq: Every day | Status: DC
  Administered 2014-01-04 – 2014-01-05 (×2)

## 2013-12-28 MED ORDER — LINAGLIPTIN 5 MG PO TABS
5.0000 mg | ORAL_TABLET | Freq: Every day | ORAL | Status: DC
  Administered 2013-12-28 – 2014-01-07 (×11): 5 mg via ORAL
  Filled 2013-12-28 (×12): qty 1
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
## 2013-12-28 NOTE — Progress Notes (Signed)
Pt. Complaining of shortness of breath.  Patient arousable but lethargic.  Oxygen saturation 90s but desat into 80s while sleeping.  Dr. Haroldine Laws paged and made aware.  New orders received.  Will continue to monitor.

## 2013-12-28 NOTE — Progress Notes (Signed)
Patient noted diaphoretic and with sob. O2 applied, blood glucose check, VS stable. RR called. Dr. Haroldine Laws paged and left message. Orders placed per Rapid response nurse. Oncoming nurse report off. Will continue to monitor.  Ave Filter, RN

## 2013-12-28 NOTE — Progress Notes (Signed)
  Amiodarone Drug - Drug Interaction Consult Note  Recommendations: Reduce digoxin if it's to be restarted Keep zocor max dose at 20mg  Rx will sign off   Amiodarone is metabolized by the cytochrome P450 system and therefore has the potential to cause many drug interactions. Amiodarone has an average plasma half-life of 50 days (range 20 to 100 days).   There is potential for drug interactions to occur several weeks or months after stopping treatment and the onset of drug interactions may be slow after initiating amiodarone.   [x]  Statins: Increased risk of myopathy. Simvastatin- restrict dose to 20mg  daily. Other statins: counsel patients to report any muscle pain or weakness immediately.  []  Anticoagulants: Amiodarone can increase anticoagulant effect. Consider warfarin dose reduction. Patients should be monitored closely and the dose of anticoagulant altered accordingly, remembering that amiodarone levels take several weeks to stabilize.  []  Antiepileptics: Amiodarone can increase plasma concentration of phenytoin, the dose should be reduced. Note that small changes in phenytoin dose can result in large changes in levels. Monitor patient and counsel on signs of toxicity.  [x]  Beta blockers: increased risk of bradycardia, AV block and myocardial depression. Sotalol - avoid concomitant use.  []   Calcium channel blockers (diltiazem and verapamil): increased risk of bradycardia, AV block and myocardial depression.  []   Cyclosporine: Amiodarone increases levels of cyclosporine. Reduced dose of cyclosporine is recommended.  [x]  Digoxin dose should be halved when amiodarone is started.  []  Diuretics: increased risk of cardiotoxicity if hypokalemia occurs.  []  Oral hypoglycemic agents (glyburide, glipizide, glimepiride): increased risk of hypoglycemia. Patient's glucose levels should be monitored closely when initiating amiodarone therapy.   []  Drugs that prolong the QT interval:  Torsades de  pointes risk may be increased with concurrent use - avoid if possible.  Monitor QTc, also keep magnesium/potassium WNL if concurrent therapy can't be avoided. Marland Kitchen Antibiotics: e.g. fluoroquinolones, erythromycin. . Antiarrhythmics: e.g. quinidine, procainamide, disopyramide, sotalol. . Antipsychotics: e.g. phenothiazines, haloperidol.  . Lithium, tricyclic antidepressants, and methadone. Thank You,   Onnie Boer, PharmD Pager: [phone] 12/28/2013 12:49 PM

## 2013-12-28 NOTE — Progress Notes (Signed)
Amylase call back was only 67. Await CT and other w/up. HR 150. Cardizem 5 mg IV push ordered for HR. Continue BB Dig on hold until level known. Diuretics on hold till we know Vol Status and cards has eval.

## 2013-12-28 NOTE — Progress Notes (Signed)
  Patient more lethargic and dyspneic. Co-ox 53%. U/o sluggish. Will start milrinone and move to SDU.  Travin Marik,MD 4:01 PM

## 2013-12-28 NOTE — Progress Notes (Signed)
Peripherally Inserted Central Catheter/Midline Placement  The IV Nurse has discussed with the patient and/or persons authorized to consent for the patient, the purpose of this procedure and the potential benefits and risks involved with this procedure.  The benefits include less needle sticks, lab draws from the catheter and patient may be discharged home with the catheter.  Risks include, but not limited to, infection, bleeding, blood clot (thrombus formation), and puncture of an artery; nerve damage and irregular heat beat.  Alternatives to this procedure were also discussed.  PICC/Midline Placement Documentation        Raymond Pratt 12/28/2013, 3:21 PM
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
## 2013-12-28 NOTE — Progress Notes (Signed)
Patient arrived to unit directly from PCP. Safety precautions reviewed with patient/ Call light within reach. Alarm activated. TELE applied and confirmed. Dr. Virgina Jock paged r/t his elevate HR. Patient appears in no distress. Awaiting for orders.   Ave Filter, RN

## 2013-12-29 ENCOUNTER — Inpatient Hospital Stay (HOSPITAL_COMMUNITY): Payer: BC Managed Care – PPO

## 2013-12-29 ENCOUNTER — Encounter (HOSPITAL_COMMUNITY): Payer: Self-pay | Admitting: Radiology

## 2013-12-29 DIAGNOSIS — I482 Chronic atrial fibrillation: Secondary | ICD-10-CM

## 2013-12-29 DIAGNOSIS — I059 Rheumatic mitral valve disease, unspecified: Secondary | ICD-10-CM

## 2013-12-29 DIAGNOSIS — B029 Zoster without complications: Secondary | ICD-10-CM

## 2013-12-29 LAB — GLUCOSE, CAPILLARY
GLUCOSE-CAPILLARY: 142 mg/dL — AB (ref 70–99)
GLUCOSE-CAPILLARY: 213 mg/dL — AB (ref 70–99)

## 2013-12-29 LAB — CARBOXYHEMOGLOBIN
Carboxyhemoglobin: 1.3 % (ref 0.5–1.5)
Methemoglobin: 0.9 % (ref 0.0–1.5)
O2 Saturation: 64.7 %
Total hemoglobin: 13.6 g/dL (ref 13.5–18.0)

## 2013-12-29 LAB — PROTIME-INR
INR: 3.8 — AB (ref 0.00–1.49)
Prothrombin Time: 37.7 seconds — ABNORMAL HIGH (ref 11.6–15.2)

## 2013-12-29 LAB — COMPREHENSIVE METABOLIC PANEL
ALT: 514 U/L — AB (ref 0–53)
AST: 823 U/L — AB (ref 0–37)
Albumin: 2.9 g/dL — ABNORMAL LOW (ref 3.5–5.2)
Alkaline Phosphatase: 107 U/L (ref 39–117)
Anion gap: 14 (ref 5–15)
BILIRUBIN TOTAL: 2.9 mg/dL — AB (ref 0.3–1.2)
BUN: 58 mg/dL — ABNORMAL HIGH (ref 6–23)
CALCIUM: 8.6 mg/dL (ref 8.4–10.5)
CHLORIDE: 82 meq/L — AB (ref 96–112)
CO2: 33 meq/L — AB (ref 19–32)
CREATININE: 1.82 mg/dL — AB (ref 0.50–1.35)
GFR calc Af Amer: 44 mL/min — ABNORMAL LOW (ref >90)
GFR calc non Af Amer: 38 mL/min — ABNORMAL LOW (ref >90)
GLUCOSE: 124 mg/dL — AB (ref 70–99)
Potassium: 3.2 mEq/L — ABNORMAL LOW (ref 3.7–5.3)
Sodium: 129 mEq/L — ABNORMAL LOW (ref 137–147)
Total Protein: 6.6 g/dL (ref 6.0–8.3)

## 2013-12-29 LAB — CBC
HCT: 40.3 % (ref 39.0–52.0)
HEMOGLOBIN: 13.5 g/dL (ref 13.0–17.0)
MCH: 30.7 pg (ref 26.0–34.0)
MCHC: 33.5 g/dL (ref 30.0–36.0)
MCV: 91.6 fL (ref 78.0–100.0)
PLATELETS: 342 10*3/uL (ref 150–400)
RBC: 4.4 MIL/uL (ref 4.22–5.81)
RDW: 15.2 % (ref 11.5–15.5)
WBC: 8.1 10*3/uL (ref 4.0–10.5)

## 2013-12-29 MED ORDER — POTASSIUM CHLORIDE CRYS ER 20 MEQ PO TBCR
40.0000 meq | EXTENDED_RELEASE_TABLET | Freq: Once | ORAL | Status: AC
  Administered 2013-12-29: 40 meq via ORAL
  Filled 2013-12-29: qty 2

## 2013-12-29 MED ORDER — POTASSIUM CHLORIDE CRYS ER 20 MEQ PO TBCR
40.0000 meq | EXTENDED_RELEASE_TABLET | Freq: Once | ORAL | Status: AC
  Administered 2013-12-29: 40 meq via ORAL

## 2013-12-29 MED ORDER — IOHEXOL 300 MG/ML  SOLN
25.0000 mL | INTRAMUSCULAR | Status: AC
  Administered 2013-12-29 (×2): 25 mL via ORAL

## 2013-12-29 MED ORDER — FUROSEMIDE 10 MG/ML IJ SOLN
30.0000 mg/h | INTRAVENOUS | Status: DC
  Administered 2013-12-29: 10 mg/h via INTRAVENOUS
  Administered 2013-12-30 – 2013-12-31 (×2): 20 mg/h via INTRAVENOUS
  Filled 2013-12-29 (×7): qty 25

## 2013-12-29 MED ORDER — PERFLUTREN LIPID MICROSPHERE
1.0000 mL | INTRAVENOUS | Status: AC | PRN
  Filled 2013-12-29: qty 10

## 2013-12-29 MED ORDER — VALACYCLOVIR HCL 500 MG PO TABS
1000.0000 mg | ORAL_TABLET | Freq: Two times a day (BID) | ORAL | Status: AC
  Administered 2013-12-29 – 2014-01-04 (×13): 1000 mg via ORAL
  Filled 2013-12-29 (×14): qty 2

## 2013-12-29 NOTE — Progress Notes (Signed)
Patient ID: Raymond Pratt, male   DOB: Nov 16, 1950, 63 y.o.   MRN: [id number]   Raymond Pratt is a 63 y.o. morbidly obese male with h/o chronic combined systolic and diastolic CHF ([id number]) Echo: EF 20-25%, chronic AF (2010 s/p failed cardioversion), DM, HTN and OSA on CPAP.   Discharged from the hospital 01/20/13 for [facility] HF. He diursed 40 lbs and required lasix gtt. Discharge weight was 276 lbs.   Apparently had been doing well until a few weeks ago. Weight was down in the 250s. However over past few weeks has gained over 30 pounds and now weight 289 pounds. Yesterday felt nauseated and also noted pruritic rash on trunk so went to see Dr. Virgina Jock. Labs revealed elevated transaminases in 500s with bilirubin in 3s. Admitted for liver failure.   Denies abdominal pain. Chronic dyspnea without change. + ab bloating. No orthopnea or PND. No CP.  Started on milrinone with co-ox 53%.  Amiodarone begun with elevated rate. Sluggish diuresis overnight, creatinine elevated at 1.8.  LFTs remain elevated.  Rash on back remains pruritic.   Scheduled Meds: . antiseptic oral rinse  7 mL Mouth Rinse BID  . aspirin EC  81 mg Oral Daily  . linagliptin  5 mg Oral Daily  . metoprolol succinate  50 mg Oral BID  . potassium chloride SA  20 mEq Oral BID  . potassium chloride  40 mEq Oral Once  . potassium chloride  40 mEq Oral Once  . sodium chloride  3 mL Intravenous Q12H  . Warfarin - Pharmacist Dosing Inpatient   Does not apply q1800   Continuous Infusions: . sodium chloride 10 mL/hr at 12/29/13 0743  . amiodarone 30 mg/hr (12/29/13 0743)  . furosemide (LASIX) infusion    . milrinone 0.25 mcg/kg/min (12/29/13 0500)   PRN Meds:.ondansetron (ZOFRAN) IV, ondansetron, sodium chloride   Filed Vitals:   12/29/13 0400 12/29/13 0500 12/29/13 0742 12/29/13 0810  BP: 103/62 121/104    Pulse:    80  Temp:    97.5 F (36.4 C)  TempSrc:    Oral  Resp: 24 21  21   Height:   5\' 7"  (1.702 m)   Weight:   290 lb 9.1  oz (131.8 kg)   SpO2: 94% 99%  95%    Intake/Output Summary (Last 24 hours) at 12/29/13 0827 Last data filed at 12/29/13 0500  Gross per 24 hour  Intake 1042.21 ml  Output   1150 ml  Net -107.79 ml    LABS: Basic Metabolic Panel:  Recent Labs  12/29/13 0430  NA 129*  K 3.2*  CL 82*  CO2 33*  GLUCOSE 124*  BUN 58*  CREATININE 1.82*  CALCIUM 8.6   Liver Function Tests:  Recent Labs  12/28/13 1210 12/29/13 0430  AST 732* 823*  ALT 449* 514*  ALKPHOS 118* 107  BILITOT 3.2* 2.9*  PROT 7.1 6.6  ALBUMIN 3.0* 2.9*    Recent Labs  12/28/13 1210  LIPASE 53  AMYLASE 59   CBC:  Recent Labs  12/29/13 0430  WBC 8.1  HGB 13.5  HCT 40.3  MCV 91.6  PLT 342   Cardiac Enzymes: No results found for this basename: CKTOTAL, CKMB, CKMBINDEX, TROPONINI,  in the last 72 hours BNP: No components found with this basename: POCBNP,  D-Dimer: No results found for this basename: DDIMER,  in the last 72 hours Hemoglobin A1C: No results found for this basename: HGBA1C,  in the last 72 hours Fasting Lipid Panel:  Recent Labs  12/28/13 1210  TRIG 53   Thyroid Function Tests: No results found for this basename: TSH, T4TOTAL, FREET3, T3FREE, THYROIDAB,  in the last 72 hours Anemia Panel: No results found for this basename: VITAMINB12, FOLATE, FERRITIN, TIBC, IRON, RETICCTPCT,  in the last 72 hours  RADIOLOGY: Dg Chest Port 1 View  12/28/2013   CLINICAL DATA:  Line placement.  Subsequent encounter.  EXAM: PORTABLE CHEST - 1 VIEW  COMPARISON:  Chest radiograph 01/15/2013.  FINDINGS: Cardiomegaly. LEFT upper extremity PICC is present with the tip in the mid to lower SVC, approximately 1 vertebral body inferior to the carina. Lung volumes are low. Monitoring leads project over the chest. Tubing projects over the LEFT chest.  IMPRESSION: New LEFT upper extremity PICC with the tip in the mid to lower SVC.   Electronically Signed   By: Dereck Ligas M.D.   On: 12/28/2013 15:53      PHYSICAL EXAM General: NAD Neck: JVP 14-16, no thyromegaly or thyroid nodule.  Lungs: Clear to auscultation bilaterally with normal respiratory effort. CV: Nondisplaced PMI.  Heart mildly tachy, irregular S1/S2, no S3/S4, no murmur.  1+ ankle edema.   Abdomen: Soft, nontender, no hepatosplenomegaly, no distention.  Neurologic: Alert and oriented x 3.  Psych: Normal affect. Extremities: No clubbing or cyanosis.  Skin: vesicular-appearing rash across back.    TELEMETRY: Reviewed telemetry pt in atrial fibrillation rate 100s   Assessment:    1. Volume overload  2. Acute on chronic systolic HF: Nonischemic cardiomyopathy - EF 20-25%  3. Chronic AF now with RVR  4. Acute liver failure  5. DM2  6. Morbid obesity  7. AKI on CKD  Plan/Discussion:    Overall scenario concerning for decompensated HF (R>L) though he denies many of the classic symptoms. However weight is up over 30 pounds and he is edematous. He is on milrinone now with low output => co-ox up to 65% this morning.  JVP quite high on exam.  Suspect liver failure likely related to low output or passive hepatopathy. However, must also consider other causes.  Dr. Virgina Jock working up.  - Awaiting echo. - Follow co-ox, CVP (need to start checking CVPs) - Continue current milrinone with improved co-ox, will start Lasix gtt at 10 mg/hr.   Elevated creatinine, ?cardiorenal.  Follow closely.   Chronic atrial fibrillation with elevated rate on milrinone.  Amiodarone was started, will need to follow LFTs closely.  Simvastatin held. Continue warfarin.   Given rash on back, some concern for disseminated Zoster which could also cause hepatitis.  Will involve ID.   [name] 12/29/2013 8:34 AM

## 2013-12-29 NOTE — Progress Notes (Addendum)
Subjective: He feels a bit better today. On airborne isolation for vesicular rash.  Objective: Vital signs in last 24 hours: Temp:  [97.3 F (36.3 C)-98.8 F (37.1 C)] 97.5 F (36.4 C) (10/24 0810) Pulse Rate:  [80-131] 80 (10/24 0810) Resp:  [11-28] 21 (10/24 0810) BP: (92-137)/(45-104) 121/104 mmHg (10/24 0500) SpO2:  [88 %-100 %] 95 % (10/24 0810) Weight:  [131.1 kg (289 lb 0.4 oz)-133.3 kg (293 lb 14 oz)] 131.8 kg (290 lb 9.1 oz) (10/24 0742) Weight change:  Last BM Date: 12/28/13  Intake/Output from previous day: 10/23 0701 - 10/24 0700 In: 1082.2 [P.O.:680; I.V.:402.2] Out: 1150 [Urine:1150] Intake/Output this shift:    General appearance: alert, cooperative and appears stated age Resp: clear to auscultation bilaterally Cardio: irregular, tachy, no sig murmur, no rub. GI: soft, non-tender; bowel sounds normal; no masses,  no organomegaly Extremities: 1+ bilat LE edema Neurologic: Grossly normal On trunck and a bit on arms has a few excoriated areas that could be vesicular.  Lab Results:  Recent Labs  12/29/13 0430  WBC 8.1  HGB 13.5  HCT 40.3  PLT 342   BMET  Recent Labs  12/29/13 0430  NA 129*  K 3.2*  CL 82*  CO2 33*  GLUCOSE 124*  BUN 58*  CREATININE 1.82*  CALCIUM 8.6   CMET CMP     Component Value Date/Time   NA 129* 12/29/2013 0430   K 3.2* 12/29/2013 0430   CL 82* 12/29/2013 0430   CO2 33* 12/29/2013 0430   GLUCOSE 124* 12/29/2013 0430   BUN 58* 12/29/2013 0430   CREATININE 1.82* 12/29/2013 0430   CALCIUM 8.6 12/29/2013 0430   PROT 6.6 12/29/2013 0430   ALBUMIN 2.9* 12/29/2013 0430   AST 823* 12/29/2013 0430   ALT 514* 12/29/2013 0430   ALKPHOS 107 12/29/2013 0430   BILITOT 2.9* 12/29/2013 0430   GFRNONAA 38* 12/29/2013 0430   GFRAA 44* 12/29/2013 0430     Studies/Results: Dg Chest Port 1 View  12/28/2013   CLINICAL DATA:  Line placement.  Subsequent encounter.  EXAM: PORTABLE CHEST - 1 VIEW  COMPARISON:  Chest radiograph  01/15/2013.  FINDINGS: Cardiomegaly. LEFT upper extremity PICC is present with the tip in the mid to lower SVC, approximately 1 vertebral body inferior to the carina. Lung volumes are low. Monitoring leads project over the chest. Tubing projects over the LEFT chest.  IMPRESSION: New LEFT upper extremity PICC with the tip in the mid to lower SVC.   Electronically Signed   By: Dereck Ligas M.D.   On: 12/28/2013 15:53    Medications: I have reviewed the patient's current medications.  Marland Kitchen antiseptic oral rinse  7 mL Mouth Rinse BID  . aspirin EC  81 mg Oral Daily  . linagliptin  5 mg Oral Daily  . metoprolol succinate  50 mg Oral BID  . potassium chloride SA  20 mEq Oral BID  . potassium chloride  40 mEq Oral Once  . potassium chloride  40 mEq Oral Once  . sodium chloride  3 mL Intravenous Q12H  . Warfarin - Pharmacist Dosing Inpatient   Does not apply q1800   CBG (last 3)   Recent Labs  12/28/13 1521 12/28/13 1755 12/28/13 2145  GLUCAP 77 86 120*    Lab Results  Component Value Date   INR 3.80* 12/29/2013   INR 3.52* 12/28/2013   INR 1.48 03/07/2013    Last sugar 140 or so.  Assessment/Plan:  Active Problems:  Acute liver failure-unclear cause. May be due to decompensated R > L heart failure with passive liver congestion.  I suppose it could be due to a viral infection (agree with ID consult and airborne isolation for now).  Cont. chf management per cardiology.  Agree with CT abd to rule out anatomic liver pathology.   Acute on chronic heart failure-see above. DM2-may need to place on ssi. He was placed back on insulin as an outpatient recently he says.  However, sugars controlled for now on just oral dppIV medication.  Full Code. anticoag-per pharmacy   LOS: 1 day   Jerlyn Ly, MD 12/29/2013, 10:26 AM

## 2013-12-29 NOTE — Progress Notes (Signed)
Patient refused CPAP, said that he was restless and would prefer to not use tonight.  RT will continue to monitor.

## 2013-12-29 NOTE — Progress Notes (Signed)
  Echocardiogram 2D Echocardiogram has been performed.  Donata Clay 12/29/2013, 9:57 AM

## 2013-12-29 NOTE — Progress Notes (Signed)
Patient was transferred to [facility] and the RN stated that she needed to come down with pt. But she could not come with him as of 1745, she would need to call back. I advised the RN patient had received PO contrast, and was ready for CT as of 1345, and that we really should try to do the CT today, 10/23. She will call when the patient is able to come down. Patient may need additional contrast PO prior to CT.
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
## 2013-12-30 DIAGNOSIS — R911 Solitary pulmonary nodule: Secondary | ICD-10-CM

## 2013-12-30 DIAGNOSIS — Z7901 Long term (current) use of anticoagulants: Secondary | ICD-10-CM

## 2013-12-30 LAB — HEPATITIS PANEL, ACUTE
HCV Ab: NEGATIVE
HEP A IGM: NONREACTIVE
HEP B C IGM: NONREACTIVE
Hepatitis B Surface Ag: NEGATIVE

## 2013-12-30 LAB — COMPREHENSIVE METABOLIC PANEL
ALT: 530 U/L — ABNORMAL HIGH (ref 0–53)
ANION GAP: 18 — AB (ref 5–15)
AST: 718 U/L — ABNORMAL HIGH (ref 0–37)
Albumin: 3.1 g/dL — ABNORMAL LOW (ref 3.5–5.2)
Alkaline Phosphatase: 115 U/L (ref 39–117)
BILIRUBIN TOTAL: 3.2 mg/dL — AB (ref 0.3–1.2)
BUN: 59 mg/dL — AB (ref 6–23)
CO2: 29 mEq/L (ref 19–32)
CREATININE: 1.85 mg/dL — AB (ref 0.50–1.35)
Calcium: 8.8 mg/dL (ref 8.4–10.5)
Chloride: 82 mEq/L — ABNORMAL LOW (ref 96–112)
GFR calc non Af Amer: 37 mL/min — ABNORMAL LOW (ref >90)
GFR, EST AFRICAN AMERICAN: 43 mL/min — AB (ref >90)
GLUCOSE: 150 mg/dL — AB (ref 70–99)
Potassium: 3.8 mEq/L (ref 3.7–5.3)
Sodium: 129 mEq/L — ABNORMAL LOW (ref 137–147)
Total Protein: 6.9 g/dL (ref 6.0–8.3)

## 2013-12-30 LAB — CARBOXYHEMOGLOBIN
Carboxyhemoglobin: 1.2 % (ref 0.5–1.5)
Methemoglobin: 1 % (ref 0.0–1.5)
O2 Saturation: 51.7 %
TOTAL HEMOGLOBIN: 13.8 g/dL (ref 13.5–18.0)

## 2013-12-30 LAB — PROTIME-INR
INR: 4.38 — ABNORMAL HIGH (ref 0.00–1.49)
PROTHROMBIN TIME: 42.1 s — AB (ref 11.6–15.2)

## 2013-12-30 LAB — CBC
HEMATOCRIT: 40.3 % (ref 39.0–52.0)
HEMOGLOBIN: 13.6 g/dL (ref 13.0–17.0)
MCH: 30.8 pg (ref 26.0–34.0)
MCHC: 33.7 g/dL (ref 30.0–36.0)
MCV: 91.2 fL (ref 78.0–100.0)
Platelets: 335 10*3/uL (ref 150–400)
RBC: 4.42 MIL/uL (ref 4.22–5.81)
RDW: 15.2 % (ref 11.5–15.5)
WBC: 8.9 10*3/uL (ref 4.0–10.5)

## 2013-12-30 LAB — GLUCOSE, CAPILLARY
GLUCOSE-CAPILLARY: 158 mg/dL — AB (ref 70–99)
Glucose-Capillary: 163 mg/dL — ABNORMAL HIGH (ref 70–99)
Glucose-Capillary: 218 mg/dL — ABNORMAL HIGH (ref 70–99)

## 2013-12-30 LAB — HIV ANTIBODY (ROUTINE TESTING W REFLEX): HIV: NONREACTIVE

## 2013-12-30 MED ORDER — ALTEPLASE 2 MG IJ SOLR
2.0000 mg | Freq: Once | INTRAMUSCULAR | Status: AC
  Administered 2013-12-30: 2 mg
  Filled 2013-12-30: qty 2

## 2013-12-30 MED ORDER — METOLAZONE 2.5 MG PO TABS
2.5000 mg | ORAL_TABLET | Freq: Two times a day (BID) | ORAL | Status: DC
  Administered 2013-12-30 (×2): 2.5 mg via ORAL
  Filled 2013-12-30 (×4): qty 1

## 2013-12-30 NOTE — Progress Notes (Signed)
Patient remains having trouble keeping leads in place and monitoring difficulty.  Patient changed to portable Tele pack earlier today, but since being here since 1900 have replaced leads 4 times.  Taped leads in place.  Central monitoring called to say VT episode strip saved. Patient scratching chest. Trying to monitor.

## 2013-12-30 NOTE — Progress Notes (Signed)
Subjective: He wants to eat  A more regular diet. No pain.  Less sob than on admission. Appreciate cards, ID, pharmacy input/help.  Objective: Vital signs in last 24 hours: Temp:  [97.4 F (36.3 C)-97.5 F (36.4 C)] 97.4 F (36.3 C) (10/25 0800) Pulse Rate:  [60-121] 60 (10/24 2300) Resp:  [15-29] 29 (10/25 0824) BP: (85-126)/(53-84) 93/65 mmHg (10/25 1007) SpO2:  [92 %-98 %] 92 % (10/25 0341) Weight change: 0.7 kg (1 lb 8.7 oz) Last BM Date: 12/29/13  Intake/Output from previous day: 10/24 0701 - 10/25 0700 In: 2370.6 [P.O.:1100; I.V.:670.6] Out: 1050 [Urine:1050] Intake/Output this shift: Total I/O In: 690.8 [P.O.:320; I.V.:370.8] Out: 325 [Urine:325]  General appearance: alert, cooperative and appears stated age Resp: clear to auscultation bilaterally Cardio: tachy, no sig. m/r/g GI: soft, non-tender; bowel sounds normal; no masses,  no organomegaly Extremities: extremities normal, atraumatic, no cyanosis or edema Neurologic: Grossly normal   Lab Results:  Recent Labs  12/29/13 0430 12/30/13 0510  WBC 8.1 8.9  HGB 13.5 13.6  HCT 40.3 40.3  PLT 342 335   BMET  Recent Labs  12/29/13 0430 12/30/13 0510  NA 129* 129*  K 3.2* 3.8  CL 82* 82*  CO2 33* 29  GLUCOSE 124* 150*  BUN 58* 59*  CREATININE 1.82* 1.85*  CALCIUM 8.6 8.8   CMET CMP     Component Value Date/Time   NA 129* 12/30/2013 0510   K 3.8 12/30/2013 0510   CL 82* 12/30/2013 0510   CO2 29 12/30/2013 0510   GLUCOSE 150* 12/30/2013 0510   BUN 59* 12/30/2013 0510   CREATININE 1.85* 12/30/2013 0510   CALCIUM 8.8 12/30/2013 0510   PROT 6.9 12/30/2013 0510   ALBUMIN 3.1* 12/30/2013 0510   AST 718* 12/30/2013 0510   ALT 530* 12/30/2013 0510   ALKPHOS 115 12/30/2013 0510   BILITOT 3.2* 12/30/2013 0510   GFRNONAA 37* 12/30/2013 0510   GFRAA 43* 12/30/2013 0510     Studies/Results: Ct Abdomen Pelvis Wo Contrast  12/29/2013   CLINICAL DATA:  63 year old male with history of abdominal  pain and nausea. Acute signs of liver and renal failure.  EXAM: CT ABDOMEN AND PELVIS WITHOUT CONTRAST  TECHNIQUE: Multidetector CT imaging of the abdomen and pelvis was performed following the standard protocol without IV contrast.  COMPARISON:  No priors.  FINDINGS: Lower chest: 8 x 10 mm smoothly marginated left lower lobe pulmonary nodule (image 10 of series 205). A few tiny pleural-based calcifications are noted in the lower right hemithorax. Moderate cardiomegaly. Atherosclerotic calcifications in the right coronary artery. No pericardial fluid, thickening or pericardial calcification.  Hepatobiliary: Mild diffuse decreased attenuation throughout the hepatic parenchyma, compatible with hepatic steatosis. No definite focal hepatic lesions. Gallbladder is unremarkable in appearance.  Pancreas: Unremarkable.  Spleen: Unremarkable.  Adrenals/Urinary Tract: 4 mm calcification in the lower pole collecting system of the right kidney, likely to represent a nonobstructive calculus. No additional calculi are identified within the left renal collecting system, along the course of either ureter, or within the lumen of the urinary bladder. No hydroureteronephrosis to indicate urinary tract obstruction at this time. Urinary bladder is unremarkable in appearance. Bilateral adrenal glands are normal in appearance.  Stomach/Bowel: Normal appearance of the stomach. No pathologic dilatation of the small bowel or colon. Normal appendix.  Vascular/Lymphatic: Atherosclerosis throughout the abdominal and pelvic vasculature, without definite aneurysm. No pathologically enlarged lymph nodes are noted in the abdomen or pelvis on today's non contrast CT examination. Numerous prominent borderline enlarged  bilateral inguinal and left external iliac lymph nodes are noted, but are nonspecific.  Reproductive: Prostate gland is heavily calcified (nonspecific).  Other: Trace volume of ascites.  No pneumoperitoneum.  Musculoskeletal: There are  no aggressive appearing lytic or blastic lesions noted in the visualized portions of the skeleton.  IMPRESSION: 1. No definite acute findings in the abdomen or pelvis. 2. There is a trace volume of ascites. 3. 4 mm nonobstructive calculus in the lower pole collecting system of the right kidney. 4. 10 x 8 mm left lower lobe pulmonary nodule (image 10 of series 205). A short-term follow-up chest CT is recommended in 3 months to ensure the stability or resolution of this finding, as neoplasm is not excluded. 5. Mild hepatic steatosis. 6. Moderate cardiomegaly. 7. Atherosclerosis, including right coronary artery disease. Please note that although the presence of coronary artery calcium documents the presence of coronary artery disease, the severity of this disease and any potential stenosis cannot be assessed on this non-gated CT examination. Assessment for potential risk factor modification, dietary therapy or pharmacologic therapy may be warranted, if clinically indicated.   Electronically Signed   By: Vinnie Langton M.D.   On: 12/29/2013 14:48   Dg Chest Port 1 View  12/28/2013   CLINICAL DATA:  Line placement.  Subsequent encounter.  EXAM: PORTABLE CHEST - 1 VIEW  COMPARISON:  Chest radiograph 01/15/2013.  FINDINGS: Cardiomegaly. LEFT upper extremity PICC is present with the tip in the mid to lower SVC, approximately 1 vertebral body inferior to the carina. Lung volumes are low. Monitoring leads project over the chest. Tubing projects over the LEFT chest.  IMPRESSION: New LEFT upper extremity PICC with the tip in the mid to lower SVC.   Electronically Signed   By: Dereck Ligas M.D.   On: 12/28/2013 15:53    Medications: I have reviewed the patient's current medications.  Marland Kitchen alteplase  2 mg Intracatheter Once  . antiseptic oral rinse  7 mL Mouth Rinse BID  . aspirin EC  81 mg Oral Daily  . linagliptin  5 mg Oral Daily  . metoprolol succinate  50 mg Oral BID  . potassium chloride SA  20 mEq Oral BID   . sodium chloride  3 mL Intravenous Q12H  . valACYclovir  1,000 mg Oral BID  . Warfarin - Pharmacist Dosing Inpatient   Does not apply q1800   Lab Results  Component Value Date   INR 4.38* 12/30/2013   INR 3.80* 12/29/2013   INR 3.52* 12/28/2013     Assessment/Plan:  Active Problems:   Acute liver failure-likely due to passive liver congestion due to R > L acute on chronic chf.  Follow labs daily. Suppose viral etiology is possible. Denies recent nsaid use.  CT shows no bile obstruction or other obvious cause of abnL LFT. Continue current meds.   Acute on chronic heart failure-per cardiology Possible zoster-swab pending. On valtrex Advance diet.   LOS: 2 days   Jerlyn Ly, MD 12/30/2013, 12:10 PM

## 2013-12-30 NOTE — Progress Notes (Signed)
INFECTIOUS DISEASE PROGRESS NOTE  ID: Raymond Pratt is a 63 y.o. male with  Active Problems:   Acute liver failure   Acute on chronic heart failure  Subjective: Denies SOb, no change in rash.  Abtx:  Anti-infectives   Start     Dose/Rate Route Frequency Ordered Stop   12/29/13 1700  valACYclovir (VALTREX) tablet 1,000 mg     1,000 mg Oral 2 times daily 12/29/13 1440        Medications:  Scheduled: . antiseptic oral rinse  7 mL Mouth Rinse BID  . aspirin EC  81 mg Oral Daily  . linagliptin  5 mg Oral Daily  . metoprolol succinate  50 mg Oral BID  . potassium chloride SA  20 mEq Oral BID  . sodium chloride  3 mL Intravenous Q12H  . valACYclovir  1,000 mg Oral BID  . Warfarin - Pharmacist Dosing Inpatient   Does not apply q1800    Objective: Vital signs in last 24 hours: Temp:  [97.4 F (36.3 C)-97.5 F (36.4 C)] 97.4 F (36.3 C) (10/25 0800) Pulse Rate:  [60-121] 60 (10/24 2300) Resp:  [15-29] 29 (10/25 0824) BP: (85-126)/(53-84) 93/65 mmHg (10/25 1007) SpO2:  [92 %-98 %] 92 % (10/25 0341)   General appearance: alert, cooperative and no distress Resp: clear to auscultation bilaterally Cardio: regular rate and rhythm GI: normal findings: bowel sounds normal and soft, non-tender Skin: scattered, small blisters/crusts.   Lab Results  Recent Labs  12/29/13 0430 12/30/13 0510  WBC 8.1 8.9  HGB 13.5 13.6  HCT 40.3 40.3  NA 129* 129*  K 3.2* 3.8  CL 82* 82*  CO2 33* 29  BUN 58* 59*  CREATININE 1.82* 1.85*   Liver Panel  Recent Labs  12/28/13 1210 12/29/13 0430 12/30/13 0510  PROT 7.1 6.6 6.9  ALBUMIN 3.0* 2.9* 3.1*  AST 732* 823* 718*  ALT 449* 514* 530*  ALKPHOS 118* 107 115  BILITOT 3.2* 2.9* 3.2*  BILIDIR 1.9*  --   --   IBILI 1.3*  --   --    Sedimentation Rate No results found for this basename: ESRSEDRATE,  in the last 72 hours C-Reactive Protein No results found for this basename: CRP,  in the last 72  hours  Microbiology: Recent Results (from the past 240 hour(s))  MRSA PCR SCREENING     Status: None   Collection Time    12/28/13  4:55 PM      Result Value Ref Range Status   MRSA by PCR NEGATIVE  NEGATIVE Final   Comment:            The GeneXpert MRSA Assay (FDA     approved for NASAL specimens     only), is one component of a     comprehensive MRSA colonization     surveillance program. It is not     intended to diagnose MRSA     infection nor to guide or     monitor treatment for     MRSA infections.    Studies/Results: Ct Abdomen Pelvis Wo Contrast  12/29/2013   CLINICAL DATA:  63 year old male with history of abdominal pain and nausea. Acute signs of liver and renal failure.  EXAM: CT ABDOMEN AND PELVIS WITHOUT CONTRAST  TECHNIQUE: Multidetector CT imaging of the abdomen and pelvis was performed following the standard protocol without IV contrast.  COMPARISON:  No priors.  FINDINGS: Lower chest: 8 x 10 mm smoothly marginated left lower lobe pulmonary  nodule (image 10 of series 205). A few tiny pleural-based calcifications are noted in the lower right hemithorax. Moderate cardiomegaly. Atherosclerotic calcifications in the right coronary artery. No pericardial fluid, thickening or pericardial calcification.  Hepatobiliary: Mild diffuse decreased attenuation throughout the hepatic parenchyma, compatible with hepatic steatosis. No definite focal hepatic lesions. Gallbladder is unremarkable in appearance.  Pancreas: Unremarkable.  Spleen: Unremarkable.  Adrenals/Urinary Tract: 4 mm calcification in the lower pole collecting system of the right kidney, likely to represent a nonobstructive calculus. No additional calculi are identified within the left renal collecting system, along the course of either ureter, or within the lumen of the urinary bladder. No hydroureteronephrosis to indicate urinary tract obstruction at this time. Urinary bladder is unremarkable in appearance. Bilateral  adrenal glands are normal in appearance.  Stomach/Bowel: Normal appearance of the stomach. No pathologic dilatation of the small bowel or colon. Normal appendix.  Vascular/Lymphatic: Atherosclerosis throughout the abdominal and pelvic vasculature, without definite aneurysm. No pathologically enlarged lymph nodes are noted in the abdomen or pelvis on today's non contrast CT examination. Numerous prominent borderline enlarged bilateral inguinal and left external iliac lymph nodes are noted, but are nonspecific.  Reproductive: Prostate gland is heavily calcified (nonspecific).  Other: Trace volume of ascites.  No pneumoperitoneum.  Musculoskeletal: There are no aggressive appearing lytic or blastic lesions noted in the visualized portions of the skeleton.  IMPRESSION: 1. No definite acute findings in the abdomen or pelvis. 2. There is a trace volume of ascites. 3. 4 mm nonobstructive calculus in the lower pole collecting system of the right kidney. 4. 10 x 8 mm left lower lobe pulmonary nodule (image 10 of series 205). A short-term follow-up chest CT is recommended in 3 months to ensure the stability or resolution of this finding, as neoplasm is not excluded. 5. Mild hepatic steatosis. 6. Moderate cardiomegaly. 7. Atherosclerosis, including right coronary artery disease. Please note that although the presence of coronary artery calcium documents the presence of coronary artery disease, the severity of this disease and any potential stenosis cannot be assessed on this non-gated CT examination. Assessment for potential risk factor modification, dietary therapy or pharmacologic therapy may be warranted, if clinically indicated.   Electronically Signed   By: Vinnie Langton M.D.   On: 12/29/2013 14:48   Dg Chest Port 1 View  12/28/2013   CLINICAL DATA:  Line placement.  Subsequent encounter.  EXAM: PORTABLE CHEST - 1 VIEW  COMPARISON:  Chest radiograph 01/15/2013.  FINDINGS: Cardiomegaly. LEFT upper extremity PICC is  present with the tip in the mid to lower SVC, approximately 1 vertebral body inferior to the carina. Lung volumes are low. Monitoring leads project over the chest. Tubing projects over the LEFT chest.  IMPRESSION: New LEFT upper extremity PICC with the tip in the mid to lower SVC.   Electronically Signed   By: Dereck Ligas M.D.   On: 12/28/2013 15:53     Assessment/Plan: Zoster  Hepatitis  CHF (Ef 15-20%) ARF, acute on chronic Pulmonary Nodule  Total days of antibiotics: 2 valtrex  LFTs are roughly unchanged Will continue valtrex, await his viral studies.  Viral serologies (hepatitis, hiv) are -          Bobby Rumpf Infectious Diseases (pager) [phone] [email or web address] 12/30/2013, 11:02 AM  LOS: 2 days

## 2013-12-30 NOTE — Progress Notes (Signed)
Pharmacist Heart Failure Core Measure Documentation  Assessment: Raymond Pratt has an EF documented as 15-20% on 12/29/13 by ECHO.  Rationale: Heart failure patients with left ventricular systolic dysfunction (LVSD) and an EF < 40% should be prescribed an angiotensin converting enzyme inhibitor (ACEI) or angiotensin receptor blocker (ARB) at discharge unless a contraindication is documented in the medical record.  This patient is not currently on an ACEI or ARB for HF.  This note is being placed in the record in order to provide documentation that a contraindication to the use of these agents is present for this encounter.  ACE Inhibitor or Angiotensin Receptor Blocker is contraindicated (specify all that apply)  []   ACEI allergy AND ARB allergy []   Angioedema []   Moderate or severe aortic stenosis []   Hyperkalemia [x]   Hypotension []   Renal artery stenosis [x]   Worsening renal function, preexisting renal disease or dysfunction   Raymond Pratt 12/30/2013 3:13 PM

## 2013-12-30 NOTE — Progress Notes (Signed)
"  I am so sick of being woke up every hour or so  For vital signs or something. Please allow me to sleep for 6 hours undisturbed. Will I get arrested if I leave tonight." Patient upset about not getting any rest. Will allow rest tonight and not disturb patient til 0600 in AM. Staff made aware. Will monitor.

## 2013-12-30 NOTE — Progress Notes (Signed)
  Dr. Jackalyn Lombard note reviewed.   Continues to struggle with advanced HF. CVP 23. Co-ox 51%. Poor urine output despite lasix gtt at 10/hr.   Echo reviewed. LVEF 15-20% RV function moderately reduced.   Will increase lasix to 20/hr. Increase milrinone to 0.375. Add metolazone. Hopefully hemodynamics will improve once volume is off. However he is quite tenuous.   Daniel Bensimhon,MD 1:31 PM

## 2013-12-30 NOTE — Progress Notes (Signed)
Patient ID: Raymond Pratt, male   DOB: 09-10-1950, 63 y.o.   MRN: [id number]   Raymond Pratt is a 63 y.o. morbidly obese male with h/o chronic combined systolic and diastolic CHF ([id number]) Echo: EF 20-25%, chronic AF (2010 s/p failed cardioversion), DM, HTN and OSA on CPAP.   Denies abdominal pain. Chronic dyspnea without change. + ab bloating. No orthopnea or PND. No CP.  Started on milrinone with little clinical improvement.  Amiodarone begun with elevated rate. He is not yet diuresing very well despite lasix drip.   Scheduled Meds: . antiseptic oral rinse  7 mL Mouth Rinse BID  . aspirin EC  81 mg Oral Daily  . linagliptin  5 mg Oral Daily  . metoprolol succinate  50 mg Oral BID  . potassium chloride SA  20 mEq Oral BID  . sodium chloride  3 mL Intravenous Q12H  . valACYclovir  1,000 mg Oral BID  . Warfarin - Pharmacist Dosing Inpatient   Does not apply q1800   Continuous Infusions: . sodium chloride 10 mL/hr at 12/29/13 0743  . amiodarone 30 mg/hr (12/30/13 0620)  . furosemide (LASIX) infusion Stopped (12/30/13 [id number])  . milrinone 0.25 mcg/kg/min (12/30/13 0620)   PRN Meds:.ondansetron (ZOFRAN) IV, ondansetron, sodium chloride   Filed Vitals:   12/30/13 0400 12/30/13 0800 12/30/13 0824 12/30/13 1007  BP:   85/63 93/65  Pulse:      Temp:  97.4 F (36.3 C)    TempSrc:  Oral    Resp: 23  29   Height:      Weight:      SpO2:        Intake/Output Summary (Last 24 hours) at 12/30/13 1038 Last data filed at 12/30/13 1000  Gross per 24 hour  Intake 2348.8 ml  Output    800 ml  Net 1548.8 ml    LABS: Basic Metabolic Panel:  Recent Labs  12/29/13 0430 12/30/13 0510  NA 129* 129*  K 3.2* 3.8  CL 82* 82*  CO2 33* 29  GLUCOSE 124* 150*  BUN 58* 59*  CREATININE 1.82* 1.85*  CALCIUM 8.6 8.8   Liver Function Tests:  Recent Labs  12/29/13 0430 12/30/13 0510  AST 823* 718*  ALT 514* 530*  ALKPHOS 107 115  BILITOT 2.9* 3.2*  PROT 6.6 6.9  ALBUMIN 2.9*  3.1*    Recent Labs  12/28/13 1210  LIPASE 53  AMYLASE 59   CBC:  Recent Labs  12/29/13 0430 12/30/13 0510  WBC 8.1 8.9  HGB 13.5 13.6  HCT 40.3 40.3  MCV 91.6 91.2  PLT 342 335   Cardiac Enzymes: No results found for this basename: CKTOTAL, CKMB, CKMBINDEX, TROPONINI,  in the last 72 hours BNP: No components found with this basename: POCBNP,  D-Dimer: No results found for this basename: DDIMER,  in the last 72 hours Hemoglobin A1C: No results found for this basename: HGBA1C,  in the last 72 hours Fasting Lipid Panel:  Recent Labs  12/28/13 1210  TRIG 53   Thyroid Function Tests: No results found for this basename: TSH, T4TOTAL, FREET3, T3FREE, THYROIDAB,  in the last 72 hours Anemia Panel: No results found for this basename: VITAMINB12, FOLATE, FERRITIN, TIBC, IRON, RETICCTPCT,  in the last 72 hours  RADIOLOGY: Dg Chest Port 1 View  12/28/2013   CLINICAL DATA:  Line placement.  Subsequent encounter.  EXAM: PORTABLE CHEST - 1 VIEW  COMPARISON:  Chest radiograph 01/15/2013.  FINDINGS: Cardiomegaly. LEFT upper extremity PICC is present  with the tip in the mid to lower SVC, approximately 1 vertebral body inferior to the carina. Lung volumes are low. Monitoring leads project over the chest. Tubing projects over the LEFT chest.  IMPRESSION: New LEFT upper extremity PICC with the tip in the mid to lower SVC.   Electronically Signed   By: Dereck Ligas M.D.   On: 12/28/2013 15:53    PHYSICAL EXAM General: morbidly obese , chronically illl HEENT:  Op clear Neck: JVP 14-16, no thyromegaly or thyroid nodule.  Lungs: Clear to auscultation bilaterally with normal respiratory effort. CV: Nondisplaced PMI.  Heart tachycardic, irregular S1/S2, no S3/S4, no murmur.  1+ ankle edema.   Abdomen: Soft, nontender, no hepatosplenomegaly, no distention.  Neurologic: Alert and oriented x 3.  Psych: Normal affect. Extremities: No clubbing or cyanosis.  Skin: vesicular-appearing rash  across back. excoriations diffusely  TELEMETRY: Reviewed telemetry pt in atrial fibrillation rate 100s   Assessment:    1. Volume overload  2. Acute on chronic systolic HF: Nonischemic cardiomyopathy - EF 20-25%  3. Chronic AF now with RVR  4. Acute liver failure  5. DM2  6. Morbid obesity  7. AKI on CKD  Plan/Discussion:    The patient has severe decompensated systolic and diastolic CHF (R>L).  He probably has an end stage cardiomyopathy though he denies many of the classic symptoms. However weight is up over 30 pounds and he is edematous. He is on milrinone now with low output but is not making significant improvement. He is not diuresing very well with IV lasix.  Suspect liver failure likely related to low output or passive hepatopathy. However, must also consider other causes.  Primary team is working up.  Echo is reviewed - Continue current milrinone, continue IV Lasix gtt at 10 mg/hr.   Elevated creatinine, ?cardiorenal.  Follow closely.   Chronic atrial fibrillation with elevated rate on milrinone.  Amiodarone was started, will need to follow LFTs closely.  Simvastatin held. Continue warfarin.   ID is now on board.  His prognosis is quite poor.  I think that ultimately a more palliative approach would be advised.  Thompson Grayer 12/30/2013 10:38 AM

## 2013-12-30 NOTE — Progress Notes (Signed)
Found patient with leads off several times thru out the night. When replacing leads found patient with IV tubing on floor and on the bed. IV tubing changed twice and cath cleaned with alcohol for 30 seconds before placing back in Picc line each time. Milrinone leaking in bed and on floor. Explained again the necessity of keeping IV tubing intact. Judy Pimple charge RN of findings. Several calls from monitoring team about leads being off. Patient states he hates being connected to so much stuff, "I can't move around." Placed patient in a different lounge chair for his comfort. Patient quite and cooperative for now. Will continue to monitor.
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
## 2013-12-31 DIAGNOSIS — R21 Rash and other nonspecific skin eruption: Secondary | ICD-10-CM

## 2013-12-31 DIAGNOSIS — I509 Heart failure, unspecified: Secondary | ICD-10-CM

## 2013-12-31 DIAGNOSIS — N179 Acute kidney failure, unspecified: Secondary | ICD-10-CM

## 2013-12-31 DIAGNOSIS — N189 Chronic kidney disease, unspecified: Secondary | ICD-10-CM

## 2013-12-31 DIAGNOSIS — K7290 Hepatic failure, unspecified without coma: Secondary | ICD-10-CM

## 2013-12-31 DIAGNOSIS — E871 Hypo-osmolality and hyponatremia: Secondary | ICD-10-CM

## 2013-12-31 DIAGNOSIS — K759 Inflammatory liver disease, unspecified: Secondary | ICD-10-CM

## 2013-12-31 LAB — COMPREHENSIVE METABOLIC PANEL
ALBUMIN: 2.7 g/dL — AB (ref 3.5–5.2)
ALK PHOS: 133 U/L — AB (ref 39–117)
ALT: 464 U/L — AB (ref 0–53)
AST: 509 U/L — AB (ref 0–37)
Anion gap: 14 (ref 5–15)
BUN: 58 mg/dL — ABNORMAL HIGH (ref 6–23)
CALCIUM: 7.8 mg/dL — AB (ref 8.4–10.5)
CO2: 30 mEq/L (ref 19–32)
Chloride: 81 mEq/L — ABNORMAL LOW (ref 96–112)
Creatinine, Ser: 1.81 mg/dL — ABNORMAL HIGH (ref 0.50–1.35)
GFR calc Af Amer: 45 mL/min — ABNORMAL LOW (ref >90)
GFR calc non Af Amer: 38 mL/min — ABNORMAL LOW (ref >90)
GLUCOSE: 346 mg/dL — AB (ref 70–99)
POTASSIUM: 2.8 meq/L — AB (ref 3.7–5.3)
SODIUM: 125 meq/L — AB (ref 137–147)
Total Bilirubin: 2.6 mg/dL — ABNORMAL HIGH (ref 0.3–1.2)
Total Protein: 6.2 g/dL (ref 6.0–8.3)

## 2013-12-31 LAB — CBC
HCT: 37.2 % — ABNORMAL LOW (ref 39.0–52.0)
HEMOGLOBIN: 12.5 g/dL — AB (ref 13.0–17.0)
MCH: 30.5 pg (ref 26.0–34.0)
MCHC: 33.6 g/dL (ref 30.0–36.0)
MCV: 90.7 fL (ref 78.0–100.0)
Platelets: 295 10*3/uL (ref 150–400)
RBC: 4.1 MIL/uL — ABNORMAL LOW (ref 4.22–5.81)
RDW: 15.1 % (ref 11.5–15.5)
WBC: 7.5 10*3/uL (ref 4.0–10.5)

## 2013-12-31 LAB — VARICELLA ZOSTER ANTIBODY, IGG: Varicella IgG: >4000 Index — ABNORMAL HIGH (ref <135.00)

## 2013-12-31 LAB — POTASSIUM: POTASSIUM: 3.4 meq/L — AB (ref 3.7–5.3)

## 2013-12-31 LAB — MAGNESIUM: MAGNESIUM: 2 mg/dL (ref 1.5–2.5)

## 2013-12-31 LAB — GLUCOSE, CAPILLARY
GLUCOSE-CAPILLARY: 156 mg/dL — AB (ref 70–99)
Glucose-Capillary: 165 mg/dL — ABNORMAL HIGH (ref 70–99)
Glucose-Capillary: 151 mg/dL — ABNORMAL HIGH (ref 70–99)
Glucose-Capillary: 157 mg/dL — ABNORMAL HIGH (ref 70–99)

## 2013-12-31 LAB — PROTIME-INR
INR: 3.73 — AB (ref 0.00–1.49)
Prothrombin Time: 37.2 seconds — ABNORMAL HIGH (ref 11.6–15.2)

## 2013-12-31 LAB — SODIUM: SODIUM: 129 meq/L — AB (ref 137–147)

## 2013-12-31 LAB — CARBOXYHEMOGLOBIN
Carboxyhemoglobin: 1.1 % (ref 0.5–1.5)
METHEMOGLOBIN: 1 % (ref 0.0–1.5)
O2 SAT: 54.3 %
Total hemoglobin: 12.9 g/dL — ABNORMAL LOW (ref 13.5–18.0)

## 2013-12-31 LAB — HEMOGLOBIN A1C
Hgb A1c MFr Bld: 7.7 % — ABNORMAL HIGH (ref <5.7)
Mean Plasma Glucose: 174 mg/dL — ABNORMAL HIGH (ref <117)

## 2013-12-31 MED ORDER — INFLUENZA VAC SPLIT QUAD 0.5 ML IM SUSY
0.5000 mL | PREFILLED_SYRINGE | INTRAMUSCULAR | Status: AC
  Administered 2014-01-03: 0.5 mL via INTRAMUSCULAR
  Filled 2013-12-31 (×2): qty 0.5

## 2013-12-31 MED ORDER — HYDROCERIN EX CREA
TOPICAL_CREAM | Freq: Two times a day (BID) | CUTANEOUS | Status: DC
Start: 2013-12-31 — End: 2014-01-07
  Administered 2013-12-31 – 2014-01-01 (×2): via TOPICAL
  Administered 2014-01-02: 1 via TOPICAL
  Administered 2014-01-02 – 2014-01-03 (×2): via TOPICAL
  Administered 2014-01-03: 1 via TOPICAL
  Administered 2014-01-04: 22:00:00 via TOPICAL
  Administered 2014-01-04: 1 via TOPICAL
  Administered 2014-01-05 – 2014-01-07 (×5): via TOPICAL
  Filled 2013-12-31: qty 113

## 2013-12-31 MED ORDER — FUROSEMIDE 10 MG/ML IJ SOLN
20.0000 mg/h | INTRAMUSCULAR | Status: DC
  Administered 2013-12-31 – 2014-01-02 (×6): 30 mg/h via INTRAVENOUS
  Administered 2014-01-02: 20 mg/h via INTRAVENOUS
  Filled 2013-12-31 (×14): qty 25

## 2013-12-31 MED ORDER — METOLAZONE 5 MG PO TABS
5.0000 mg | ORAL_TABLET | Freq: Two times a day (BID) | ORAL | Status: DC
  Administered 2013-12-31 – 2014-01-01 (×4): 5 mg via ORAL
  Filled 2013-12-31 (×8): qty 1

## 2013-12-31 MED ORDER — SODIUM CHLORIDE 0.9 % IJ SOLN
10.0000 mL | INTRAMUSCULAR | Status: DC | PRN
  Administered 2014-01-03: 20 mL

## 2013-12-31 MED ORDER — TOLVAPTAN 15 MG PO TABS
15.0000 mg | ORAL_TABLET | Freq: Once | ORAL | Status: AC
  Administered 2013-12-31: 15 mg via ORAL
  Filled 2013-12-31: qty 1

## 2013-12-31 MED ORDER — SODIUM CHLORIDE 0.9 % IJ SOLN
10.0000 mL | Freq: Two times a day (BID) | INTRAMUSCULAR | Status: DC
  Administered 2013-12-31 – 2014-01-02 (×3): 10 mL
  Administered 2014-01-02: 20 mL
  Administered 2014-01-03 (×2): 10 mL
  Administered 2014-01-04: 20 mL
  Administered 2014-01-05: 10 mL
  Administered 2014-01-06: 20 mL
  Administered 2014-01-06 – 2014-01-07 (×2): 10 mL

## 2013-12-31 MED ORDER — POTASSIUM CHLORIDE 10 MEQ/100ML IV SOLN
10.0000 meq | INTRAVENOUS | Status: DC
  Administered 2013-12-31: 10 meq via INTRAVENOUS
  Filled 2013-12-31: qty 100

## 2013-12-31 MED ORDER — POTASSIUM CHLORIDE CRYS ER 20 MEQ PO TBCR
40.0000 meq | EXTENDED_RELEASE_TABLET | Freq: Once | ORAL | Status: AC
  Administered 2013-12-31: 40 meq via ORAL
  Filled 2013-12-31: qty 2

## 2013-12-31 MED ORDER — INSULIN GLARGINE 100 UNIT/ML ~~LOC~~ SOLN
15.0000 [IU] | Freq: Two times a day (BID) | SUBCUTANEOUS | Status: DC
  Administered 2013-12-31 – 2014-01-07 (×15): 15 [IU] via SUBCUTANEOUS
  Filled 2013-12-31 (×16): qty 0.15

## 2013-12-31 MED ORDER — SPIRONOLACTONE 12.5 MG HALF TABLET
12.5000 mg | ORAL_TABLET | Freq: Every day | ORAL | Status: DC
  Administered 2013-12-31 – 2014-01-06 (×7): 12.5 mg via ORAL
  Filled 2013-12-31 (×8): qty 1

## 2013-12-31 MED ORDER — AMIODARONE HCL 200 MG PO TABS
400.0000 mg | ORAL_TABLET | Freq: Two times a day (BID) | ORAL | Status: DC
Start: 2013-12-31 — End: 2014-01-06
  Administered 2013-12-31 – 2014-01-05 (×12): 400 mg via ORAL
  Filled 2013-12-31 (×15): qty 2

## 2013-12-31 MED ORDER — POTASSIUM CHLORIDE 10 MEQ/100ML IV SOLN: 10.0000 meq | INTRAVENOUS | Status: DC

## 2013-12-31 MED ORDER — INSULIN ASPART 100 UNIT/ML ~~LOC~~ SOLN
0.0000 [IU] | Freq: Three times a day (TID) | SUBCUTANEOUS | Status: DC
  Administered 2013-12-31 (×3): 3 [IU] via SUBCUTANEOUS
  Administered 2014-01-01 (×3): 2 [IU] via SUBCUTANEOUS
  Administered 2014-01-02 (×2): 3 [IU] via SUBCUTANEOUS
  Administered 2014-01-02: 2 [IU] via SUBCUTANEOUS
  Administered 2014-01-03: 11 [IU] via SUBCUTANEOUS
  Administered 2014-01-03: 3 [IU] via SUBCUTANEOUS
  Administered 2014-01-03: 2 [IU] via SUBCUTANEOUS
  Administered 2014-01-04: 5 [IU] via SUBCUTANEOUS
  Administered 2014-01-04: 3 [IU] via SUBCUTANEOUS
  Administered 2014-01-04: 5 [IU] via SUBCUTANEOUS
  Administered 2014-01-05: 2 [IU] via SUBCUTANEOUS
  Administered 2014-01-05: 5 [IU] via SUBCUTANEOUS
  Administered 2014-01-06 (×2): 2 [IU] via SUBCUTANEOUS
  Administered 2014-01-07: 3 [IU] via SUBCUTANEOUS

## 2013-12-31 MED ORDER — LORAZEPAM 0.5 MG PO TABS
0.5000 mg | ORAL_TABLET | Freq: Three times a day (TID) | ORAL | Status: DC | PRN
  Administered 2013-12-31 – 2014-01-03 (×3): 0.5 mg via ORAL
  Filled 2013-12-31 (×3): qty 1

## 2013-12-31 MED ORDER — LORAZEPAM 0.5 MG PO TABS
0.5000 mg | ORAL_TABLET | Freq: Once | ORAL | Status: AC
  Administered 2013-12-31: 0.5 mg via ORAL
  Filled 2013-12-31: qty 1

## 2013-12-31 MED ORDER — POTASSIUM CHLORIDE CRYS ER 20 MEQ PO TBCR
40.0000 meq | EXTENDED_RELEASE_TABLET | Freq: Two times a day (BID) | ORAL | Status: AC
  Administered 2013-12-31 (×2): 40 meq via ORAL
  Filled 2013-12-31: qty 2

## 2013-12-31 MED ORDER — PNEUMOCOCCAL VAC POLYVALENT 25 MCG/0.5ML IJ INJ
0.5000 mL | INJECTION | INTRAMUSCULAR | Status: AC
  Administered 2014-01-03: 0.5 mL via INTRAMUSCULAR
  Filled 2013-12-31 (×2): qty 0.5

## 2013-12-31 NOTE — Progress Notes (Signed)
Subjective: Admitted c Shock Liver d/t R CHF.  May have been brought on by Viral Vesicular rash and AFib RVR. He looks lots better than Fridy. He is having lots of frustration and panic Sx and wants to get out of ICU.  His IV pump beeping is driving him mad. Appreciate cards, ID, pharmacy input/help.  Objective: Vital signs in last 24 hours: Temp:  [97.2 F (36.2 C)-98.2 F (36.8 C)] 97.2 F (36.2 C) (10/26 0300) Pulse Rate:  [62-114] 91 (10/26 0300) Resp:  [17-29] 22 (10/26 0300) BP: (82-106)/(35-72) 106/64 mmHg (10/26 0300) SpO2:  [91 %-98 %] 91 % (10/26 0300) Weight:  [134.3 kg (296 lb 1.2 oz)] 134.3 kg (296 lb 1.2 oz) (10/26 0300) Weight change: 2.5 kg (5 lb 8.2 oz) Last BM Date: 12/29/13  Intake/Output from previous day: 10/25 0701 - 10/26 0700 In: 1812.7 [P.O.:620; I.V.:1192.7] Out: 2250 [Urine:2250] Intake/Output this shift:    General appearance: alert, cooperative and appears stated age Resp: clear to auscultation bilaterally Cardio: tachy, no sig. m/r/g GI: soft, non-tender; bowel sounds normal; no masses,  no organomegaly, Obese Extremities: extremities normal, atraumatic, no cyanosis or edema Neurologic: Grossly normal Chest c much less Rash.  No warmth. Skin coloring much better than friday   Lab Results:  Recent Labs  12/30/13 0510 12/31/13 0407  WBC 8.9 7.5  HGB 13.6 12.5*  HCT 40.3 37.2*  PLT 335 295   BMET  Recent Labs  12/30/13 0510 12/31/13 0407  NA 129* 125*  K 3.8 2.8*  CL 82* 81*  CO2 29 30  GLUCOSE 150* 346*  BUN 59* 58*  CREATININE 1.85* 1.81*  CALCIUM 8.8 7.8*   CMET CMP     Component Value Date/Time   NA 125* 12/31/2013 0407   K 2.8* 12/31/2013 0407   CL 81* 12/31/2013 0407   CO2 30 12/31/2013 0407   GLUCOSE 346* 12/31/2013 0407   BUN 58* 12/31/2013 0407   CREATININE 1.81* 12/31/2013 0407   CALCIUM 7.8* 12/31/2013 0407   PROT 6.2 12/31/2013 0407   ALBUMIN 2.7* 12/31/2013 0407   AST 509* 12/31/2013 0407   ALT 464*  12/31/2013 0407   ALKPHOS 133* 12/31/2013 0407   BILITOT 2.6* 12/31/2013 0407   GFRNONAA 38* 12/31/2013 0407   GFRAA 45* 12/31/2013 0407     Studies/Results: Ct Abdomen Pelvis Wo Contrast  12/29/2013   CLINICAL DATA:  63 year old male with history of abdominal pain and nausea. Acute signs of liver and renal failure.  EXAM: CT ABDOMEN AND PELVIS WITHOUT CONTRAST  TECHNIQUE: Multidetector CT imaging of the abdomen and pelvis was performed following the standard protocol without IV contrast.  COMPARISON:  No priors.  FINDINGS: Lower chest: 8 x 10 mm smoothly marginated left lower lobe pulmonary nodule (image 10 of series 205). A few tiny pleural-based calcifications are noted in the lower right hemithorax. Moderate cardiomegaly. Atherosclerotic calcifications in the right coronary artery. No pericardial fluid, thickening or pericardial calcification.  Hepatobiliary: Mild diffuse decreased attenuation throughout the hepatic parenchyma, compatible with hepatic steatosis. No definite focal hepatic lesions. Gallbladder is unremarkable in appearance.  Pancreas: Unremarkable.  Spleen: Unremarkable.  Adrenals/Urinary Tract: 4 mm calcification in the lower pole collecting system of the right kidney, likely to represent a nonobstructive calculus. No additional calculi are identified within the left renal collecting system, along the course of either ureter, or within the lumen of the urinary bladder. No hydroureteronephrosis to indicate urinary tract obstruction at this time. Urinary bladder is unremarkable in appearance. Bilateral  adrenal glands are normal in appearance.  Stomach/Bowel: Normal appearance of the stomach. No pathologic dilatation of the small bowel or colon. Normal appendix.  Vascular/Lymphatic: Atherosclerosis throughout the abdominal and pelvic vasculature, without definite aneurysm. No pathologically enlarged lymph nodes are noted in the abdomen or pelvis on today's non contrast CT examination.  Numerous prominent borderline enlarged bilateral inguinal and left external iliac lymph nodes are noted, but are nonspecific.  Reproductive: Prostate gland is heavily calcified (nonspecific).  Other: Trace volume of ascites.  No pneumoperitoneum.  Musculoskeletal: There are no aggressive appearing lytic or blastic lesions noted in the visualized portions of the skeleton.  IMPRESSION: 1. No definite acute findings in the abdomen or pelvis. 2. There is a trace volume of ascites. 3. 4 mm nonobstructive calculus in the lower pole collecting system of the right kidney. 4. 10 x 8 mm left lower lobe pulmonary nodule (image 10 of series 205). A short-term follow-up chest CT is recommended in 3 months to ensure the stability or resolution of this finding, as neoplasm is not excluded. 5. Mild hepatic steatosis. 6. Moderate cardiomegaly. 7. Atherosclerosis, including right coronary artery disease. Please note that although the presence of coronary artery calcium documents the presence of coronary artery disease, the severity of this disease and any potential stenosis cannot be assessed on this non-gated CT examination. Assessment for potential risk factor modification, dietary therapy or pharmacologic therapy may be warranted, if clinically indicated.   Electronically Signed   By: Vinnie Langton M.D.   On: 12/29/2013 14:48    Medications: I have reviewed the patient's current medications.  Marland Kitchen antiseptic oral rinse  7 mL Mouth Rinse BID  . aspirin EC  81 mg Oral Daily  . linagliptin  5 mg Oral Daily  . metolazone  2.5 mg Oral BID  . potassium chloride  10 mEq Intravenous Q1 Hr x 4  . potassium chloride SA  20 mEq Oral BID  . sodium chloride  3 mL Intravenous Q12H  . valACYclovir  1,000 mg Oral BID  . Warfarin - Pharmacist Dosing Inpatient   Does not apply q1800   Lab Results  Component Value Date   INR 3.73* 12/31/2013   INR 4.38* 12/30/2013   INR 3.80* 12/29/2013     Assessment/Plan:  Acute liver  failure-likely due to passive liver congestion due to R > L acute on chronic chf.  AST and ALT still 509 and 464 respectively. INR is 3.73.  Suppose viral etiology is possible. Denies recent nsaid use.  CT shows no bile obstruction or other obvious cause of abnl LFT. Continue current meds. NICCM - Acute on chronic heart failure c sig Vol Overload-per cardiology. Pro-BNP 2624 on Admit.  EF 15-20% on 12/29/13 by ECHO.  CVP 18. lasix 20/hr. Milrinone to 0.375. Metolazone.  May do UF Worsening renal function - Cr 1.8 today.  Following Cr Lactic Acidosis from Hypoperfusion d/t low CO CHF which was source of his ab pain in my office Ammonia was only 10 - good. Afib RVR - Amio helping Possible zoster-swab pending. On valtrex.  Hepatitis profile (-), (-) HIV Ab,  Viral cx P Protein Cal Malnutrition - Advance diet. DM2- ISS and Lantus to start. Continue Tradjenta. Frustration and Anxiety - Ativan added.  He wants to be moved and I do not see how we can accommodate that at this point Hypokalemia - Replete 4 mm nonobstructive calculus in the lower pole collecting system of the right kidney - NTD 10 x 8 mm  left lower lobe pulmonary nodule. A short-term follow-up chest CT is recommended in 3 months to ensure the stability or resolution of this finding, as neoplasm is not excluded. Coronary Atherosclerosis - seen on CT.  RF modify as able. Goal INR 2-3 OSA - CPAP   LOS: 3 days   Precious Reel, MD 12/31/2013, 7:46 AM

## 2013-12-31 NOTE — Progress Notes (Signed)
Utilization Review Completed.Amarys Sliwinski T10/26/2015  

## 2013-12-31 NOTE — Progress Notes (Addendum)
Patient ID: Jarmaine Gambles, male   DOB: 1950-05-09, 63 y.o.   MRN: [id number]   Majdi Moeder is a 63 y.o. morbidly obese male with h/o chronic combined systolic and diastolic CHF ([id number]) Echo: EF 20-25%, chronic AF (2010 s/p failed cardioversion), DM, HTN and OSA on CPAP.   Discharged from the hospital 01/20/13 for [facility] HF. He diursed 40 lbs and required lasix gtt. Discharge weight was 276 lbs.   Apparently had been doing well until a few weeks ago. Weight was down in the 250s. However over past few weeks has gained over 30 pounds and hit 289 pounds. Felt nauseated and also noted pruritic rash on trunk so went to see Dr. Virgina Jock. Labs revealed elevated transaminases in 500s with bilirubin in 3s. Admitted for liver failure/HF.   Remains on lasix gtt 20 mg/hr and on metolazone BID, 24 hr I/O -437 cc and weight up 3 lbs. (we reweighed patient and he was 293 not 296)/  On milrinone 0.375 mcg and amiodarone gtt 30 mg/hr. Co-ox 54%. Renal function stable.   Very anxious and irritated this am. Says he wants to leave. Frustrated that nursing response time has been slower than he would like. Denies SOB. Feels bloated. Zoster PCR still pending.     Scheduled Meds: . antiseptic oral rinse  7 mL Mouth Rinse BID  . aspirin EC  81 mg Oral Daily  . linagliptin  5 mg Oral Daily  . metolazone  2.5 mg Oral BID  . potassium chloride  10 mEq Intravenous Q1 Hr x 4  . potassium chloride SA  20 mEq Oral BID  . sodium chloride  3 mL Intravenous Q12H  . valACYclovir  1,000 mg Oral BID  . Warfarin - Pharmacist Dosing Inpatient   Does not apply q1800   Continuous Infusions: . sodium chloride 10 mL/hr at 12/29/13 0743  . amiodarone 30 mg/hr (12/31/13 0600)  . furosemide (LASIX) infusion 20 mg/hr (12/31/13 [id number])  . milrinone 0.375 mcg/kg/min (12/31/13 0600)   PRN Meds:.ondansetron (ZOFRAN) IV, ondansetron, sodium chloride   Filed Vitals:   12/30/13 1942 12/30/13 2024 12/30/13 2321 12/31/13 0300  BP: 91/35  91/35 101/72 106/64  Pulse: 96 114 62 91  Temp: 97.4 F (36.3 C)  97.4 F (36.3 C) 97.2 F (36.2 C)  TempSrc: Oral  Oral Oral  Resp: 22 20 17 22   Height:      Weight:    296 lb 1.2 oz (134.3 kg)  SpO2: 97% 97% 98% 91%    Intake/Output Summary (Last 24 hours) at 12/31/13 0745 Last data filed at 12/31/13 0617  Gross per 24 hour  Intake 1812.66 ml  Output   2250 ml  Net -437.34 ml    LABS: Basic Metabolic Panel:  Recent Labs  12/30/13 0510 12/31/13 0407  NA 129* 125*  K 3.8 2.8*  CL 82* 81*  CO2 29 30  GLUCOSE 150* 346*  BUN 59* 58*  CREATININE 1.85* 1.81*  CALCIUM 8.8 7.8*   Liver Function Tests:  Recent Labs  12/30/13 0510 12/31/13 0407  AST 718* 509*  ALT 530* 464*  ALKPHOS 115 133*  BILITOT 3.2* 2.6*  PROT 6.9 6.2  ALBUMIN 3.1* 2.7*    Recent Labs  12/28/13 1210  LIPASE 53  AMYLASE 59   CBC:  Recent Labs  12/30/13 0510 12/31/13 0407  WBC 8.9 7.5  HGB 13.6 12.5*  HCT 40.3 37.2*  MCV 91.2 90.7  PLT 335 295   Cardiac Enzymes: No results found for  this basename: CKTOTAL, CKMB, CKMBINDEX, TROPONINI,  in the last 72 hours BNP: No components found with this basename: POCBNP,  D-Dimer: No results found for this basename: DDIMER,  in the last 72 hours Hemoglobin A1C: No results found for this basename: HGBA1C,  in the last 72 hours Fasting Lipid Panel:  Recent Labs  12/28/13 1210  TRIG 53   Thyroid Function Tests: No results found for this basename: TSH, T4TOTAL, FREET3, T3FREE, THYROIDAB,  in the last 72 hours Anemia Panel: No results found for this basename: VITAMINB12, FOLATE, FERRITIN, TIBC, IRON, RETICCTPCT,  in the last 72 hours  RADIOLOGY: Dg Chest Port 1 View  12/28/2013   CLINICAL DATA:  Line placement.  Subsequent encounter.  EXAM: PORTABLE CHEST - 1 VIEW  COMPARISON:  Chest radiograph 01/15/2013.  FINDINGS: Cardiomegaly. LEFT upper extremity PICC is present with the tip in the mid to lower SVC, approximately 1 vertebral  body inferior to the carina. Lung volumes are low. Monitoring leads project over the chest. Tubing projects over the LEFT chest.  IMPRESSION: New LEFT upper extremity PICC with the tip in the mid to lower SVC.   Electronically Signed   By: Dereck Ligas M.D.   On: 12/28/2013 15:53    PHYSICAL EXAM CVP 15 General: NAD Neck: JVP 14-16, no thyromegaly or thyroid nodule.  Lungs: Clear to auscultation bilaterally with normal respiratory effort. CV: Nondisplaced PMI.  Heart mildly tachy, irregular S1/S2, no S3/S4, no murmur.  3+ Woody edema.  Abdomen: nontender, no hepatosplenomegaly,  +++ distention.  Neurologic: Alert and oriented x 3.  Psych: Normal affect. Extremities: No clubbing or cyanosis.  Skin: vesicular-appearing rash across back.    TELEMETRY: Reviewed telemetry pt in atrial fibrillation rate 100s   Assessment:    1. Volume overload  2. Acute on chronic systolic biventricular HF: Nonischemic cardiomyopathy - EF 15-20%. RV moderately HK 3. Chronic AF now with RVR  4. Acute liver failure - HIV/hepatitis panels negative  5. DM2  6. Morbid obesity  7. AKI on CKD 8. Hypokalemia/hyponatremia 9. Rash - zoster PCR pending  Plan/Discussion:    Over the weekend lasit gtt increased to 20 mg/hr with metolazone BID, however remains markedly volume overloaded and having sluggish diuresis. Remains on milrinone 0.375 mcg and co-ox 54% this morning. CVP 19 and sodium trending down 125 this am. Can't use demeclocycline or tolvapatan with elevated LFTs.   Will increase lasix gtt to 30 mg hr and increase metolazone 5 mg BID. Will continue to follow renal function closely. K+ 2.8 this am will supplement and recheck at 1300. Check Mag.  Repeat ECHO shows EF 15-20% and RV fx moderately reduced.   Chronic atrial fibrillation with elevated rate on milrinone.  Amiodarone was started and remains on 30 mg/hr. He is getting a lot of fluid from this would like to wean to 15 mg/hr or switch to PO. Will  discuss with Dr. Haroldine Laws.  LFTs trending down, continue to hold Simvastatin. Continue warfarin, pharmacy dosing.   Given rash on back, some concern for disseminated Zoster, ID following.   Junie Bame B NP-C 12/31/2013 7:45 AM  Patient seen and examined with Junie Bame, NP. We discussed all aspects of the encounter. I agree with the assessment and plan as stated above.   Very agitated this morning. He has R>>L heart failure and not diuresing well with high-dose diuretics. Agree with increasing diuretic regimen. Given hyponatremia will give tolvaptan as well. We discussed possibility of ultrafiltration - he is open  to this but would like to try medical therapy first.   Hopefully he can coe off isolation soon as he is going stir crazy. Agree with careful use of benzos per primary team. Can switch amio to po.   Supp K+.   Raima Geathers,MD 9:48 AM

## 2013-12-31 NOTE — Progress Notes (Addendum)
INFECTIOUS DISEASE PROGRESS NOTE  ID: Raymond Pratt is a 63 y.o. male with hx of acute congestive heart failure presents with 30# weight gain, congestive transaminitis in 500s, elevated tbili 3. But also noted to have vesicular rash concerning for recurrent zoster Active Problems:   Acute liver failure   Acute on chronic heart failure  Subjective: Denies SOb, no change in rash. He is anxious to have room of door open since he suffers from claustophobia  Abtx:  Anti-infectives   Start     Dose/Rate Route Frequency Ordered Stop   12/29/13 1700  valACYclovir (VALTREX) tablet 1,000 mg     1,000 mg Oral 2 times daily 12/29/13 1440        Medications:  Scheduled: . amiodarone  400 mg Oral BID  . antiseptic oral rinse  7 mL Mouth Rinse BID  . aspirin EC  81 mg Oral Daily  . insulin aspart  0-15 Units Subcutaneous TID WC  . insulin glargine  15 Units Subcutaneous BID  . linagliptin  5 mg Oral Daily  . metolazone  5 mg Oral BID  . potassium chloride SA  20 mEq Oral BID  . potassium chloride  40 mEq Oral BID  . sodium chloride  10-40 mL Intracatheter Q12H  . sodium chloride  3 mL Intravenous Q12H  . spironolactone  12.5 mg Oral Daily  . tolvaptan  15 mg Oral Once  . valACYclovir  1,000 mg Oral BID  . Warfarin - Pharmacist Dosing Inpatient   Does not apply q1800    Objective: Vital signs in last 24 hours: Temp:  [97.2 F (36.2 C)-98.2 F (36.8 C)] 97.3 F (36.3 C) (10/26 0743) Pulse Rate:  [62-114] 91 (10/26 0300) Resp:  [13-22] 13 (10/26 0743) BP: (82-106)/(35-75) 101/75 mmHg (10/26 0743) SpO2:  [91 %-98 %] 94 % (10/26 0743) Weight:  [293 lb (132.904 kg)-296 lb 1.2 oz (134.3 kg)] 293 lb (132.904 kg) (10/26 0900) gen = a xo by 3 in nad. Obese male in nad Skin = numerous excoriated lesions/papules to low back, chest wall and abdomen, non-vesicular, crusted, some with excoriation, does not appear to follow a dermatome as one would expect with zoster   Lab  Results  Recent Labs  12/30/13 0510 12/31/13 0407  WBC 8.9 7.5  HGB 13.6 12.5*  HCT 40.3 37.2*  NA 129* 125*  K 3.8 2.8*  CL 82* 81*  CO2 29 30  BUN 59* 58*  CREATININE 1.85* 1.81*   Liver Panel  Recent Labs  12/28/13 1210  12/30/13 0510 12/31/13 0407  PROT 7.1  < > 6.9 6.2  ALBUMIN 3.0*  < > 3.1* 2.7*  AST 732*  < > 718* 509*  ALT 449*  < > 530* 464*  ALKPHOS 118*  < > 115 133*  BILITOT 3.2*  < > 3.2* 2.6*  BILIDIR 1.9*  --   --   --   IBILI 1.3*  --   --   --   < > = values in this interval not displayed. Sedimentation Rate No results found for this basename: ESRSEDRATE,  in the last 72 hours C-Reactive Protein No results found for this basename: CRP,  in the last 72 hours  Microbiology: Recent Results (from the past 240 hour(s))  MRSA PCR SCREENING     Status: None   Collection Time    12/28/13  4:55 PM      Result Value Ref Range Status   MRSA by PCR NEGATIVE  NEGATIVE  Final   Comment:            The GeneXpert MRSA Assay (FDA     approved for NASAL specimens     only), is one component of a     comprehensive MRSA colonization     surveillance program. It is not     intended to diagnose MRSA     infection nor to guide or     monitor treatment for     MRSA infections.    Studies/Results: Ct Abdomen Pelvis Wo Contrast  12/29/2013   CLINICAL DATA:  63 year old male with history of abdominal pain and nausea. Acute signs of liver and renal failure.  EXAM: CT ABDOMEN AND PELVIS WITHOUT CONTRAST  TECHNIQUE: Multidetector CT imaging of the abdomen and pelvis was performed following the standard protocol without IV contrast.  COMPARISON:  No priors.  FINDINGS: Lower chest: 8 x 10 mm smoothly marginated left lower lobe pulmonary nodule (image 10 of series 205). A few tiny pleural-based calcifications are noted in the lower right hemithorax. Moderate cardiomegaly. Atherosclerotic calcifications in the right coronary artery. No pericardial fluid, thickening or  pericardial calcification.  Hepatobiliary: Mild diffuse decreased attenuation throughout the hepatic parenchyma, compatible with hepatic steatosis. No definite focal hepatic lesions. Gallbladder is unremarkable in appearance.  Pancreas: Unremarkable.  Spleen: Unremarkable.  Adrenals/Urinary Tract: 4 mm calcification in the lower pole collecting system of the right kidney, likely to represent a nonobstructive calculus. No additional calculi are identified within the left renal collecting system, along the course of either ureter, or within the lumen of the urinary bladder. No hydroureteronephrosis to indicate urinary tract obstruction at this time. Urinary bladder is unremarkable in appearance. Bilateral adrenal glands are normal in appearance.  Stomach/Bowel: Normal appearance of the stomach. No pathologic dilatation of the small bowel or colon. Normal appendix.  Vascular/Lymphatic: Atherosclerosis throughout the abdominal and pelvic vasculature, without definite aneurysm. No pathologically enlarged lymph nodes are noted in the abdomen or pelvis on today's non contrast CT examination. Numerous prominent borderline enlarged bilateral inguinal and left external iliac lymph nodes are noted, but are nonspecific.  Reproductive: Prostate gland is heavily calcified (nonspecific).  Other: Trace volume of ascites.  No pneumoperitoneum.  Musculoskeletal: There are no aggressive appearing lytic or blastic lesions noted in the visualized portions of the skeleton.  IMPRESSION: 1. No definite acute findings in the abdomen or pelvis. 2. There is a trace volume of ascites. 3. 4 mm nonobstructive calculus in the lower pole collecting system of the right kidney. 4. 10 x 8 mm left lower lobe pulmonary nodule (image 10 of series 205). A short-term follow-up chest CT is recommended in 3 months to ensure the stability or resolution of this finding, as neoplasm is not excluded. 5. Mild hepatic steatosis. 6. Moderate cardiomegaly. 7.  Atherosclerosis, including right coronary artery disease. Please note that although the presence of coronary artery calcium documents the presence of coronary artery disease, the severity of this disease and any potential stenosis cannot be assessed on this non-gated CT examination. Assessment for potential risk factor modification, dietary therapy or pharmacologic therapy may be warranted, if clinically indicated.   Electronically Signed   By: Vinnie Langton M.D.   On: 12/29/2013 14:48     Assessment/Plan: Pruritic rash possible Zoster =after 4 days, lesions are healing but not completely convinced this is zoster since the pattern is not consistent with zoster. Lesions are healing. No longer vesicular. Recommend to discontinue isolation.  Would recommend using lotion eucerin  to help with dry skin/pruritic component of it. Recommend to discontinue valtrex. Although if it was more vesicular on admit, would have him finish 7 day course. He has varicella ab suggestive of past infection. Viral culture still pending, but can take up to 5-10 day to grow.   Hepatitis  = related to chf,CHF (Ef 15-20%)  ARF, acute on chronic = related to chf   Total days of antibiotics: 4 valtrex  Will sign off        Kalvin Buss Infectious Diseases (pager) [phone] [email or web address] 12/31/2013, 11:10 AM  LOS: 3 days
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
## 2013-12-31 NOTE — Progress Notes (Signed)
Called to check on patient, all leads off. Found patient sitting in chair with leads in lap, portable Tele box apart end torn off and batteries missing. Changed patient back to hard wire, central monitoring notified. Charge nurse aware. Tapped leads to chest, and asked patient to leave them on. "This stuff won't stay on me." Checked CVP while there with reading at 18. Patient saving urine as advised. "I think I'm sicker than they know." "Or else they aren't telling me." Will try to monitor.

## 2013-12-31 NOTE — Progress Notes (Signed)
CRITICAL VALUE ALERT  Critical value received:  K+ 2.8  Date of notification:  12-31-13  Time of notification:  0550  Critical value read back:Yes.    Nurse who received alert:  Tyrell Antonio  MD notified (1st page):  Maurine Simmering  Time of first page:  0555  MD notified (2nd page):  Time of second page:  Responding MD:  Maurine Simmering  Time MD responded:  [phone]

## 2014-01-01 LAB — COMPREHENSIVE METABOLIC PANEL
ALBUMIN: 3.2 g/dL — AB (ref 3.5–5.2)
ALK PHOS: 163 U/L — AB (ref 39–117)
ALT: 451 U/L — AB (ref 0–53)
AST: 407 U/L — AB (ref 0–37)
Anion gap: 11 (ref 5–15)
BILIRUBIN TOTAL: 3.1 mg/dL — AB (ref 0.3–1.2)
BUN: 53 mg/dL — ABNORMAL HIGH (ref 6–23)
CHLORIDE: 84 meq/L — AB (ref 96–112)
CO2: 39 mEq/L — ABNORMAL HIGH (ref 19–32)
Calcium: 9.2 mg/dL (ref 8.4–10.5)
Creatinine, Ser: 1.72 mg/dL — ABNORMAL HIGH (ref 0.50–1.35)
GFR calc Af Amer: 47 mL/min — ABNORMAL LOW (ref >90)
GFR, EST NON AFRICAN AMERICAN: 41 mL/min — AB (ref >90)
Glucose, Bld: 160 mg/dL — ABNORMAL HIGH (ref 70–99)
POTASSIUM: 3.1 meq/L — AB (ref 3.7–5.3)
SODIUM: 134 meq/L — AB (ref 137–147)
Total Protein: 7.4 g/dL (ref 6.0–8.3)

## 2014-01-01 LAB — GLUCOSE, CAPILLARY
GLUCOSE-CAPILLARY: 129 mg/dL — AB (ref 70–99)
Glucose-Capillary: 148 mg/dL — ABNORMAL HIGH (ref 70–99)
Glucose-Capillary: 132 mg/dL — ABNORMAL HIGH (ref 70–99)
Glucose-Capillary: 154 mg/dL — ABNORMAL HIGH (ref 70–99)

## 2014-01-01 LAB — CBC
HCT: 40.1 % (ref 39.0–52.0)
Hemoglobin: 13.7 g/dL (ref 13.0–17.0)
MCH: 31.1 pg (ref 26.0–34.0)
MCHC: 34.2 g/dL (ref 30.0–36.0)
MCV: 91.1 fL (ref 78.0–100.0)
PLATELETS: 306 10*3/uL (ref 150–400)
RBC: 4.4 MIL/uL (ref 4.22–5.81)
RDW: 15.3 % (ref 11.5–15.5)
WBC: 9.1 10*3/uL (ref 4.0–10.5)

## 2014-01-01 LAB — CARBOXYHEMOGLOBIN
Carboxyhemoglobin: 1.3 % (ref 0.5–1.5)
METHEMOGLOBIN: 0.8 % (ref 0.0–1.5)
O2 Saturation: 58.2 %
Total hemoglobin: 14.3 g/dL (ref 13.5–18.0)

## 2014-01-01 LAB — PROTIME-INR
INR: 2.17 — AB (ref 0.00–1.49)
Prothrombin Time: 24.4 seconds — ABNORMAL HIGH (ref 11.6–15.2)

## 2014-01-01 LAB — PRO B NATRIURETIC PEPTIDE: Pro B Natriuretic peptide (BNP): 1882 pg/mL — ABNORMAL HIGH (ref 0–125)

## 2014-01-01 LAB — VARICELLA ZOSTER ANTIBODY, IGM: Varicella-Zoster Ab, IgM: 0.02 {ISR} (ref <0.91)

## 2014-01-01 MED ORDER — POTASSIUM CHLORIDE CRYS ER 20 MEQ PO TBCR
40.0000 meq | EXTENDED_RELEASE_TABLET | Freq: Once | ORAL | Status: AC
  Administered 2014-01-01: 40 meq via ORAL
  Filled 2014-01-01: qty 2

## 2014-01-01 MED ORDER — WARFARIN SODIUM 5 MG PO TABS
5.0000 mg | ORAL_TABLET | Freq: Once | ORAL | Status: AC
  Administered 2014-01-01: 5 mg via ORAL
  Filled 2014-01-01: qty 1

## 2014-01-01 MED ORDER — DM-GUAIFENESIN ER 30-600 MG PO TB12
1.0000 | ORAL_TABLET | Freq: Two times a day (BID) | ORAL | Status: DC | PRN
  Administered 2014-01-01 – 2014-01-06 (×9): 1 via ORAL
  Filled 2014-01-01 (×11): qty 1

## 2014-01-01 MED ORDER — POTASSIUM CHLORIDE CRYS ER 20 MEQ PO TBCR
40.0000 meq | EXTENDED_RELEASE_TABLET | Freq: Three times a day (TID) | ORAL | Status: DC
  Administered 2014-01-01 – 2014-01-02 (×6): 40 meq via ORAL
  Filled 2014-01-01 (×8): qty 2

## 2014-01-01 MED ORDER — POTASSIUM CHLORIDE CRYS ER 20 MEQ PO TBCR: 40.0000 meq | EXTENDED_RELEASE_TABLET | Freq: Two times a day (BID) | ORAL | Status: DC

## 2014-01-01 MED ORDER — METOPROLOL SUCCINATE ER 25 MG PO TB24
25.0000 mg | ORAL_TABLET | Freq: Two times a day (BID) | ORAL | Status: DC
  Administered 2014-01-01 (×2): 25 mg via ORAL
  Filled 2014-01-01 (×4): qty 1

## 2014-01-01 NOTE — Progress Notes (Signed)
RT has entered room on several occasions to place patient on CPAP. Each time the patient asks for RT to come in at a later time. RT entered room again at 3:34am again to see if patient was ready to be placed on CPAP and he refused. RT will continue to monitor.

## 2014-01-01 NOTE — Progress Notes (Signed)
RT entered patients room to place on CPAP and patient stated he would place himself on it before he went to sleep.

## 2014-01-01 NOTE — Progress Notes (Signed)
Subjective: Admitted c Shock Liver d/t R CHF.  May have been brought on by Viral Vesicular rash and AFib RVR. He looks lots better than Friday. Appreciate cards, ID, pharmacy input/help. Dr Baxter Flattery said to complete Valtrex and remove isolation Still hypokalemic but some better Cr stable to min better Now c great diuresis and loss of weight.  Possibly he has turned the corner.  Objective: Vital signs in last 24 hours: Temp:  [97.3 F (36.3 C)-98.1 F (36.7 C)] 97.8 F (36.6 C) (10/27 0356) Pulse Rate:  [88-135] 128 (10/27 0356) Resp:  [13-24] 18 (10/27 0356) BP: (86-126)/(31-95) 111/65 mmHg (10/27 0356) SpO2:  [92 %-99 %] 99 % (10/27 0356) Weight:  [127.37 kg (280 lb 12.8 oz)-132.904 kg (293 lb)] 127.37 kg (280 lb 12.8 oz) (10/27 0300) Weight change: -1.396 kg (-3 lb 1.2 oz) Last BM Date: 12/31/13  Intake/Output from previous day: 10/26 0701 - 10/27 0700 In: 2418.5 [P.O.:1310; I.V.:1108.5] Out: 10000 [Urine:10000] Intake/Output this shift:    General appearance: alert, cooperative and appears stated age Resp: clear to auscultation bilaterally Cardio: tachy, no sig. m/r/g GI: soft, non-tender; bowel sounds normal; no masses,  no organomegaly, Obese Extremities: extremities normal, atraumatic, no cyanosis or edema Neurologic: Grossly normal Chest c much less Rash.  No warmth. Skin coloring much better than friday   Lab Results:  Recent Labs  12/31/13 0407 01/01/14 0415  WBC 7.5 9.1  HGB 12.5* 13.7  HCT 37.2* 40.1  PLT 295 306   BMET  Recent Labs  12/31/13 0407 12/31/13 1600 01/01/14 0415  NA 125* 129* 134*  K 2.8* 3.4* 3.1*  CL 81*  --  84*  CO2 30  --  39*  GLUCOSE 346*  --  160*  BUN 58*  --  53*  CREATININE 1.81*  --  1.72*  CALCIUM 7.8*  --  9.2   CMET CMP     Component Value Date/Time   NA 134* 01/01/2014 0415   K 3.1* 01/01/2014 0415   CL 84* 01/01/2014 0415   CO2 39* 01/01/2014 0415   GLUCOSE 160* 01/01/2014 0415   BUN 53* 01/01/2014 0415    CREATININE 1.72* 01/01/2014 0415   CALCIUM 9.2 01/01/2014 0415   PROT 7.4 01/01/2014 0415   ALBUMIN 3.2* 01/01/2014 0415   AST 407* 01/01/2014 0415   ALT 451* 01/01/2014 0415   ALKPHOS 163* 01/01/2014 0415   BILITOT 3.1* 01/01/2014 0415   GFRNONAA 41* 01/01/2014 0415   GFRAA 47* 01/01/2014 0415     Studies/Results: No results found.  Medications: I have reviewed the patient's current medications.  Marland Kitchen amiodarone  400 mg Oral BID  . antiseptic oral rinse  7 mL Mouth Rinse BID  . aspirin EC  81 mg Oral Daily  . hydrocerin   Topical BID  . Influenza vac split quadrivalent PF  0.5 mL Intramuscular Tomorrow-1000  . insulin aspart  0-15 Units Subcutaneous TID WC  . insulin glargine  15 Units Subcutaneous BID  . linagliptin  5 mg Oral Daily  . metolazone  5 mg Oral BID  . pneumococcal 23 valent vaccine  0.5 mL Intramuscular Tomorrow-1000  . potassium chloride SA  20 mEq Oral BID  . sodium chloride  10-40 mL Intracatheter Q12H  . sodium chloride  3 mL Intravenous Q12H  . spironolactone  12.5 mg Oral Daily  . valACYclovir  1,000 mg Oral BID  . Warfarin - Pharmacist Dosing Inpatient   Does not apply q1800   Lab Results  Component  Value Date   INR 2.17* 01/01/2014   INR 3.73* 12/31/2013   INR 4.38* 12/30/2013     Assessment/Plan:  Acute liver failure-likely due to passive liver congestion due to R > L acute on chronic chf.  AST and ALT still 407 and 4651 respectively - which is slowly improving. INR is 2.17.  Suppose viral etiology is possible.  CT shows no bile obstruction or other obvious cause of abnl LFT. Continue current meds.  NICCM - Acute on chronic heart failure c sig Vol Overload-per cardiology. Pro-BNP 2624 - 1882.  EF 15-20% on 12/29/13 by ECHO.   lasix 30/hr. Milrinone to 0.375. Metolazone.  May not need to do UF as he is diuresing and wt reducing  Worsening renal function - Cr 1.7 - 1.8 today.  Following Cr.  Actually Cr better than expected even c sig  diuresis  Lactic Acidosis from Hypoperfusion d/t low CO CHF which was source of his ab pain in my office Ammonia was only 10 - good. Afib RVR - Amio helping.  INR today 2.17 Possible zoster-swab pending. On valtrex for 7 days.  Hepatitis profile (-), (-) HIV Ab,  Viral cx P.  Off isolation and skin looks better Protein Cal Malnutrition - Advance diet. DM2- ISS and Lantus. Continue Tradjenta.  CBGS fine Frustration and Anxiety better today off isolation. He is just tired- Ativan prn.   Hypokalemia - Replete 4 mm nonobstructive calculus in the lower pole collecting system of the right kidney - NTD 10 x 8 mm left lower lobe pulmonary nodule. A short-term follow-up chest CT is recommended in 3 months to ensure the stability or resolution of this finding, as neoplasm is not excluded. Coronary Atherosclerosis - seen on CT.  RF modify as able. Goal INR 2-3 OSA - CPAP.  He did not wear last night.   LOS: 4 days   Precious Reel, MD 01/01/2014, 7:12 AM
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
## 2014-01-01 NOTE — Progress Notes (Signed)
Patient ID: Raymond Pratt, male   DOB: February 13, 1951, 63 y.o.   MRN: [id number]   Raymond Pratt is a 63 y.o. morbidly obese male with h/o chronic combined systolic and diastolic CHF ([id number]) Echo: EF 20-25%, chronic AF (2010 s/p failed cardioversion), DM, HTN and OSA on CPAP.   Discharged from the hospital 01/20/13 for Raymond Pratt HF. He diursed 40 lbs and required lasix gtt. Discharge weight was 276 lbs.   Apparently had been doing well until a few weeks ago. Weight was down in the 250s. However over past few weeks has gained over 30 pounds and hit 289 pounds. Felt nauseated and also noted pruritic rash on trunk so went to see Dr. Virgina Pratt. Labs revealed elevated transaminases in 500s with bilirubin in 3s. Admitted for liver failure/HF.   Yesterday lasix increased to 30/hr and metolazone increased.  Also received one dose of tolvaptan. -7.5L overnight. Weight down 13 pounds to 280. Still diuresing well. Breathing better. AF rate is fast again after stopping IV amio yesterday. Remains on milrinone 0.375.    Scheduled Meds: . amiodarone  400 mg Oral BID  . antiseptic oral rinse  7 mL Mouth Rinse BID  . aspirin EC  81 mg Oral Daily  . hydrocerin   Topical BID  . Influenza vac split quadrivalent PF  0.5 mL Intramuscular Tomorrow-1000  . insulin aspart  0-15 Units Subcutaneous TID WC  . insulin glargine  15 Units Subcutaneous BID  . linagliptin  5 mg Oral Daily  . metolazone  5 mg Oral BID  . pneumococcal 23 valent vaccine  0.5 mL Intramuscular Tomorrow-1000  . potassium chloride  40 mEq Oral Once  . potassium chloride SA  40 mEq Oral TID  . sodium chloride  10-40 mL Intracatheter Q12H  . sodium chloride  3 mL Intravenous Q12H  . spironolactone  12.5 mg Oral Daily  . valACYclovir  1,000 mg Oral BID  . warfarin  5 mg Oral ONCE-1800  . Warfarin - Pharmacist Dosing Inpatient   Does not apply q1800   Continuous Infusions: . sodium chloride 10 mL/hr at 12/29/13 0743  . furosemide (LASIX) infusion 30  mg/hr (01/01/14 0230)  . milrinone 0.375 mcg/kg/min (01/01/14 0547)   PRN Meds:.dextromethorphan-guaiFENesin, LORazepam, ondansetron (ZOFRAN) IV, ondansetron, sodium chloride, sodium chloride   Filed Vitals:   01/01/14 0013 01/01/14 0300 01/01/14 0356 01/01/14 0742  BP: 105/73  111/65 95/61  Pulse: 131 135 128 122  Temp: 97.3 F (36.3 C)  97.8 F (36.6 C) 97.6 F (36.4 C)  TempSrc: Oral  Oral Oral  Resp: 20 18 18    Height:      Weight:  127.37 kg (280 lb 12.8 oz)    SpO2: 99%  99% 95%    Intake/Output Summary (Last 24 hours) at 01/01/14 1017 Last data filed at 01/01/14 0900  Gross per 24 hour  Intake 2283.5 ml  Output  10250 ml  Net -7966.5 ml    LABS: Basic Metabolic Panel:  Recent Labs  12/31/13 0407 12/31/13 1600 01/01/14 0415  NA 125* 129* 134*  K 2.8* 3.4* 3.1*  CL 81*  --  84*  CO2 30  --  39*  GLUCOSE 346*  --  160*  BUN 58*  --  53*  CREATININE 1.81*  --  1.72*  CALCIUM 7.8*  --  9.2  MG 2.0  --   --    Liver Function Tests:  Recent Labs  12/31/13 0407 01/01/14 0415  AST 509* 407*  ALT 464*  451*  ALKPHOS 133* 163*  BILITOT 2.6* 3.1*  PROT 6.2 7.4  ALBUMIN 2.7* 3.2*   No results found for this basename: LIPASE, AMYLASE,  in the last 72 hours CBC:  Recent Labs  12/31/13 0407 01/01/14 0415  WBC 7.5 9.1  HGB 12.5* 13.7  HCT 37.2* 40.1  MCV 90.7 91.1  PLT 295 306   Cardiac Enzymes: No results found for this basename: CKTOTAL, CKMB, CKMBINDEX, TROPONINI,  in the last 72 hours BNP: No components found with this basename: POCBNP,  D-Dimer: No results found for this basename: DDIMER,  in the last 72 hours Hemoglobin A1C:  Recent Labs  12/31/13 0407  HGBA1C 7.7*   Fasting Lipid Panel: No results found for this basename: CHOL, HDL, LDLCALC, TRIG, CHOLHDL, LDLDIRECT,  in the last 72 hours Thyroid Function Tests: No results found for this basename: TSH, T4TOTAL, FREET3, T3FREE, THYROIDAB,  in the last 72 hours Anemia Panel: No  results found for this basename: VITAMINB12, FOLATE, FERRITIN, TIBC, IRON, RETICCTPCT,  in the last 72 hours  RADIOLOGY: Dg Chest Port 1 View  12/28/2013   CLINICAL DATA:  Line placement.  Subsequent encounter.  EXAM: PORTABLE CHEST - 1 VIEW  COMPARISON:  Chest radiograph 01/15/2013.  FINDINGS: Cardiomegaly. LEFT upper extremity PICC is present with the tip in the mid to lower SVC, approximately 1 vertebral body inferior to the carina. Lung volumes are low. Monitoring leads project over the chest. Tubing projects over the LEFT chest.  IMPRESSION: New LEFT upper extremity PICC with the tip in the mid to lower SVC.   Electronically Signed   By: Dereck Ligas M.D.   On: 12/28/2013 15:53    PHYSICAL EXAM CVP 10 General: NAD Neck: JVP jaw, no thyromegaly or thyroid nodule.  Lungs: Clear to auscultation bilaterally with normal respiratory effort. CV: Nondisplaced PMI.  Heart mildly tachy, irregular S1/S2, no S3/S4, no murmur.  1+ Woody edema.  Abdomen: nontender, no hepatosplenomegaly,  + distention.  Neurologic: Alert and oriented x 3.  Psych: Normal affect. Extremities: No clubbing or cyanosis.  Skin: vesicular-appearing rash across back.    TELEMETRY: Reviewed telemetry pt in atrial fibrillation rate 120-140s   Assessment:    1. Volume overload  2. Acute on chronic systolic biventricular HF: Nonischemic cardiomyopathy - EF 15-20%. RV moderately HK 3. Chronic AF now with RVR  4. Acute liver failure - HIV/hepatitis panels negative  5. DM2  6. Morbid obesity  7. AKI on CKD 8. Hypokalemia/hyponatremia 9. Rash - zoster PCR pending  Plan/Discussion:    Great diuresis overnight. Still with mild fluid overload. Renal function stable. Hyponatremia improved. Co-ox is good on milrinone. Will diurese one more day and then work on trying to wean milrinone. AF rate back up despite po amio. Will restart low-dose Toprol. Continue coumadin. K supped.    Raymond Ziff,MD 10:17 AM

## 2014-01-02 LAB — CBC
HCT: 42.7 % (ref 39.0–52.0)
Hemoglobin: 14.5 g/dL (ref 13.0–17.0)
MCH: 31.5 pg (ref 26.0–34.0)
MCHC: 34 g/dL (ref 30.0–36.0)
MCV: 92.6 fL (ref 78.0–100.0)
Platelets: 336 10*3/uL (ref 150–400)
RBC: 4.61 MIL/uL (ref 4.22–5.81)
RDW: 15.7 % — ABNORMAL HIGH (ref 11.5–15.5)
WBC: 10.3 10*3/uL (ref 4.0–10.5)

## 2014-01-02 LAB — CARBOXYHEMOGLOBIN
CARBOXYHEMOGLOBIN: 1.4 % (ref 0.5–1.5)
Methemoglobin: 0.9 % (ref 0.0–1.5)
O2 Saturation: 56.6 %
Total hemoglobin: 15.2 g/dL (ref 13.5–18.0)

## 2014-01-02 LAB — COMPREHENSIVE METABOLIC PANEL
ALT: 369 U/L — AB (ref 0–53)
AST: 272 U/L — AB (ref 0–37)
Albumin: 3.3 g/dL — ABNORMAL LOW (ref 3.5–5.2)
Alkaline Phosphatase: 151 U/L — ABNORMAL HIGH (ref 39–117)
Anion gap: 11 (ref 5–15)
BILIRUBIN TOTAL: 3.3 mg/dL — AB (ref 0.3–1.2)
BUN: 44 mg/dL — ABNORMAL HIGH (ref 6–23)
CO2: 44 meq/L — AB (ref 19–32)
Calcium: 9.7 mg/dL (ref 8.4–10.5)
Chloride: 86 mEq/L — ABNORMAL LOW (ref 96–112)
Creatinine, Ser: 1.6 mg/dL — ABNORMAL HIGH (ref 0.50–1.35)
GFR calc non Af Amer: 45 mL/min — ABNORMAL LOW (ref >90)
GFR, EST AFRICAN AMERICAN: 52 mL/min — AB (ref >90)
Glucose, Bld: 131 mg/dL — ABNORMAL HIGH (ref 70–99)
Potassium: 3.6 mEq/L — ABNORMAL LOW (ref 3.7–5.3)
SODIUM: 141 meq/L (ref 137–147)
Total Protein: 7.5 g/dL (ref 6.0–8.3)

## 2014-01-02 LAB — GLUCOSE, CAPILLARY
GLUCOSE-CAPILLARY: 165 mg/dL — AB (ref 70–99)
GLUCOSE-CAPILLARY: 240 mg/dL — AB (ref 70–99)
Glucose-Capillary: 194 mg/dL — ABNORMAL HIGH (ref 70–99)
Glucose-Capillary: 150 mg/dL — ABNORMAL HIGH (ref 70–99)

## 2014-01-02 LAB — PROTIME-INR
INR: 1.91 — AB (ref 0.00–1.49)
PROTHROMBIN TIME: 22 s — AB (ref 11.6–15.2)

## 2014-01-02 LAB — VARICELLA-ZOSTER BY PCR: Varicella-Zoster, PCR: NOT DETECTED

## 2014-01-02 MED ORDER — METOLAZONE 5 MG PO TABS
5.0000 mg | ORAL_TABLET | Freq: Every day | ORAL | Status: DC
  Administered 2014-01-02: 5 mg via ORAL
  Filled 2014-01-02 (×2): qty 1

## 2014-01-02 MED ORDER — POTASSIUM CHLORIDE CRYS ER 20 MEQ PO TBCR: 40.0000 meq | EXTENDED_RELEASE_TABLET | Freq: Once | ORAL | Status: DC

## 2014-01-02 MED ORDER — POTASSIUM CHLORIDE CRYS ER 20 MEQ PO TBCR
40.0000 meq | EXTENDED_RELEASE_TABLET | Freq: Once | ORAL | Status: AC
  Administered 2014-01-02: 40 meq via ORAL
  Filled 2014-01-02: qty 2

## 2014-01-02 MED ORDER — PREDNISONE 20 MG PO TABS
40.0000 mg | ORAL_TABLET | Freq: Every day | ORAL | Status: AC
  Administered 2014-01-02 – 2014-01-04 (×3): 40 mg via ORAL
  Filled 2014-01-02 (×3): qty 2

## 2014-01-02 MED ORDER — WARFARIN SODIUM 7.5 MG PO TABS
7.5000 mg | ORAL_TABLET | Freq: Once | ORAL | Status: AC
  Administered 2014-01-02: 7.5 mg via ORAL
  Filled 2014-01-02: qty 1

## 2014-01-02 MED ORDER — ACETAZOLAMIDE 250 MG PO TABS
500.0000 mg | ORAL_TABLET | Freq: Two times a day (BID) | ORAL | Status: AC
  Administered 2014-01-02 (×2): 500 mg via ORAL
  Filled 2014-01-02 (×2): qty 2

## 2014-01-02 MED ORDER — METOPROLOL SUCCINATE ER 50 MG PO TB24
50.0000 mg | ORAL_TABLET | Freq: Two times a day (BID) | ORAL | Status: DC
  Administered 2014-01-02 – 2014-01-07 (×9): 50 mg via ORAL
  Filled 2014-01-02 (×12): qty 1

## 2014-01-02 NOTE — Progress Notes (Signed)
Subjective: Admitted c Shock Liver d/t R CHF.  May have been brought on by Viral Vesicular rash and AFib RVR. He looks lots better than Friday. Cr improving despite diuresis d/t better perfusion. Wt 285# on admit in my office 266.75 now.   ie 22-26# wt loss LFTs improving as well Brisk Diuresis.  Objective: Vital signs in last 24 hours: Temp:  [97.4 F (36.3 C)-98.5 F (36.9 C)] 97.4 F (36.3 C) (10/28 0350) Pulse Rate:  [104-127] 126 (10/28 0439) Resp:  [17-18] 18 (10/28 0350) BP: (86-117)/(61-76) 117/76 mmHg (10/28 0439) SpO2:  [91 %-99 %] 94 % (10/28 0350) Weight:  [121 kg (266 lb 12.1 oz)] 121 kg (266 lb 12.1 oz) (10/28 0359) Weight change: -11.904 kg (-26 lb 3.9 oz) Last BM Date: 01/02/14  Intake/Output from previous day: 10/27 0701 - 10/28 0700 In: 2395 [P.O.:1360; I.V.:1035] Out: 9525 [Urine:9525] Intake/Output this shift:    General appearance: alert, cooperative and appears stated age Resp: clear to auscultation bilaterally Cardio: tachy, no sig. m/r/g GI: soft, non-tender; bowel sounds normal; no masses,  no organomegaly, Obese Extremities: extremities normal, atraumatic, no cyanosis or edema Neurologic: Grossly normal Chest c much less Rash.  No warmth. Skin coloring much better than friday   Lab Results:  Recent Labs  01/01/14 0415 01/02/14 0455  WBC 9.1 10.3  HGB 13.7 14.5  HCT 40.1 42.7  PLT 306 336   BMET  Recent Labs  01/01/14 0415 01/02/14 0455  NA 134* 141  K 3.1* 3.6*  CL 84* 86*  CO2 39* 44*  GLUCOSE 160* 131*  BUN 53* 44*  CREATININE 1.72* 1.60*  CALCIUM 9.2 9.7   CMET CMP     Component Value Date/Time   NA 141 01/02/2014 0455   K 3.6* 01/02/2014 0455   CL 86* 01/02/2014 0455   CO2 44* 01/02/2014 0455   GLUCOSE 131* 01/02/2014 0455   BUN 44* 01/02/2014 0455   CREATININE 1.60* 01/02/2014 0455   CALCIUM 9.7 01/02/2014 0455   PROT 7.5 01/02/2014 0455   ALBUMIN 3.3* 01/02/2014 0455   AST 272* 01/02/2014 0455   ALT 369*  01/02/2014 0455   ALKPHOS 151* 01/02/2014 0455   BILITOT 3.3* 01/02/2014 0455   GFRNONAA 45* 01/02/2014 0455   GFRAA 52* 01/02/2014 0455     Studies/Results: No results found.  Medications: I have reviewed the patient's current medications.  Marland Kitchen acetaZOLAMIDE  500 mg Oral BID  . amiodarone  400 mg Oral BID  . antiseptic oral rinse  7 mL Mouth Rinse BID  . aspirin EC  81 mg Oral Daily  . hydrocerin   Topical BID  . Influenza vac split quadrivalent PF  0.5 mL Intramuscular Tomorrow-1000  . insulin aspart  0-15 Units Subcutaneous TID WC  . insulin glargine  15 Units Subcutaneous BID  . linagliptin  5 mg Oral Daily  . metolazone  5 mg Oral Daily  . metoprolol succinate  50 mg Oral BID  . pneumococcal 23 valent vaccine  0.5 mL Intramuscular Tomorrow-1000  . potassium chloride SA  40 mEq Oral TID  . potassium chloride  40 mEq Oral Once  . sodium chloride  10-40 mL Intracatheter Q12H  . sodium chloride  3 mL Intravenous Q12H  . spironolactone  12.5 mg Oral Daily  . valACYclovir  1,000 mg Oral BID  . Warfarin - Pharmacist Dosing Inpatient   Does not apply q1800   Lab Results  Component Value Date   INR 2.17* 01/01/2014   INR  3.73* 12/31/2013   INR 4.38* 12/30/2013     Assessment/Plan:  Acute liver failure- due to passive liver congestion due to R > L acute on chronic chf.  AST and ALT is finally  improving - slowly improving. INR is 2.17.  Suppose viral etiology is possible.  CT shows no bile obstruction or other obvious cause of abnl LFT. Continue current meds.  NICCM - Acute on chronic heart failure c sig Vol Overload-per cardiology. Clearly improving and doing better.  Cards saying may go home by Friday.  EF 15-20% on 12/29/13 by ECHO.   lasix 30/hr. Milrinone to 0.375. Metolazone.  May not need to do UF as he is diuresing and wt reducing  Worsening renal function - Cr 1.7 - 1.8 today.  Following Cr.  Actually Cr better than expected even c sig diuresis  Lactic Acidosis  from Hypoperfusion d/t low CO CHF which was source of his ab pain in my office Ammonia was only 10 - good. Afib RVR - Amio helping.  toprol increased per cards. INR goal 2-3 Possible zoster- IgM Abs (-). Finish valtrex after 7 days.  Hepatitis profile (-), (-) HIV Ab,  Viral cx P.  Off isolation and skin improving  Protein Cal Malnutrition - Advance diet. DM2- ISS and Lantus. Continue Tradjenta.  CBGs fine Hypokalemia - Repleted 4 mm nonobstructive calculus in the lower pole collecting system of the right kidney - NTD 10 x 8 mm left lower lobe pulmonary nodule. A short-term follow-up chest CT is recommended in 3 months to ensure the stability or resolution of this finding, as neoplasm is not excluded. Coronary Atherosclerosis - seen on CT.  RF modify as able. Goal INR 2-3 OSA - CPAP   LOS: 5 days   Precious Reel, MD 01/02/2014, 8:06 AM

## 2014-01-02 NOTE — Evaluation (Signed)
Physical Therapy Evaluation Patient Details Name: Raymond Pratt MRN: [id number] DOB: 1950/09/21 Today's Date: 01/02/2014   History of Present Illness  pt presents with SOB, CHF, and A-fib.    Clinical Impression  Pt eager for mobility and indicates feeling much better today than when admitted.  Pt's HR remains elevated in 130's throughout session with occasional nonsustained increases to 140's.  Pt indicates feeling fine when HR increases.  Feel pt will continue to make great progress with mobility to return to home at D/C.  Will continue to follow.      Follow Up Recommendations No PT follow up;Supervision - Intermittent    Equipment Recommendations  None recommended by PT    Recommendations for Other Services       Precautions / Restrictions Precautions Precautions: None Precaution Comments: Watch HR Restrictions Weight Bearing Restrictions: No      Mobility  Bed Mobility               General bed mobility comments: pt sitting EOB.    Transfers Overall transfer level: Needs assistance Equipment used: None Transfers: Sit to/from Stand Sit to Stand: Supervision         General transfer comment: pt demos good safety  Ambulation/Gait Ambulation/Gait assistance: Min guard Ambulation Distance (Feet): 300 Feet Assistive device: None (and IV Pole) Gait Pattern/deviations: Step-through pattern;Decreased stride length     General Gait Details: pt ambulated initially with no AD, but utilized IV pole for support during 2nd half of gait 2/2 fatigue.  pt's HR remained elevated in 130's with nonsustained increases to 140's during mobility.    Stairs            Wheelchair Mobility    Modified Rankin (Stroke Patients Only)       Balance Overall balance assessment: Needs assistance Sitting-balance support: No upper extremity supported;Feet supported Sitting balance-Leahy Scale: Good     Standing balance support: No upper extremity supported Standing  balance-Leahy Scale: Fair                               Pertinent Vitals/Pain Pain Assessment: No/denies pain    Home Living Family/patient expects to be discharged to:: Private residence Living Arrangements: Alone Available Help at Discharge: Family;Available PRN/intermittently Type of Home: House Home Access: Stairs to enter   Entrance Stairs-Number of Steps: 2 Home Layout: Two level;Able to live on main level with bedroom/bathroom Home Equipment: None Additional Comments: pt notes he can stay at his daughter's home when he needs help.      Prior Function Level of Independence: Independent               Hand Dominance        Extremity/Trunk Assessment   Upper Extremity Assessment: Defer to OT evaluation           Lower Extremity Assessment: Overall WFL for tasks assessed      Cervical / Trunk Assessment: Normal  Communication   Communication: No difficulties  Cognition Arousal/Alertness: Awake/alert Behavior During Therapy: WFL for tasks assessed/performed Overall Cognitive Status: Within Functional Limits for tasks assessed                      General Comments      Exercises        Assessment/Plan    PT Assessment Patient needs continued PT services  PT Diagnosis Difficulty walking   PT Problem List Decreased strength;Decreased activity  tolerance;Decreased balance;Decreased mobility;Decreased knowledge of use of DME;Cardiopulmonary status limiting activity  PT Treatment Interventions DME instruction;Gait training;Stair training;Functional mobility training;Therapeutic activities;Therapeutic exercise;Balance training;Patient/family education   PT Goals (Current goals can be found in the Care Plan section) Acute Rehab PT Goals Patient Stated Goal: To get back outside.   PT Goal Formulation: With patient Time For Goal Achievement: 01/16/14 Potential to Achieve Goals: Good    Frequency Min 3X/week   Barriers to discharge         Co-evaluation               End of Session Equipment Utilized During Treatment: Gait belt Activity Tolerance: Patient tolerated treatment well Patient left: in bed;with call bell/phone within reach (Sitting EOB) Nurse Communication: Mobility status         Time: [id number] PT Time Calculation (min): 24 min   Charges:   PT Evaluation $Initial PT Evaluation Tier I: 1 Procedure PT Treatments $Gait Training: 8-22 mins   PT G CodesCatarina Hartshorn, [place] 01/02/2014, 9:46 AM

## 2014-01-02 NOTE — Progress Notes (Signed)
Pharmacist Heart Failure Core Measure Documentation  Assessment: Raymond Pratt has an EF documented as 15-20% on 12/29/13 by Echo.  Rationale: Heart failure patients with left ventricular systolic dysfunction (LVSD) and an EF < 40% should be prescribed an angiotensin converting enzyme inhibitor (ACEI) or angiotensin receptor blocker (ARB) at discharge unless a contraindication is documented in the medical record.  This patient is not currently on an ACEI or ARB for HF.  This note is being placed in the record in order to provide documentation that a contraindication to the use of these agents is present for this encounter.  ACE Inhibitor or Angiotensin Receptor Blocker is contraindicated (specify all that apply)  []   ACEI allergy AND ARB allergy []   Angioedema []   Moderate or severe aortic stenosis []   Hyperkalemia [x]   Hypotension []   Renal artery stenosis []   Worsening renal function, preexisting renal disease or dysfunction  Raymond Pratt, Pharm D 01/02/2014 10:46 AM

## 2014-01-02 NOTE — Progress Notes (Signed)
Patient ID: Phinehas Leesman, male   DOB: [birth year], 63 y.o.   MRN: [id number]   Jaimie Edgecomb is a 63 y.o. morbidly obese male with h/o chronic combined systolic and diastolic CHF ([id number]) Echo: EF 20-25%, chronic AF (2010 s/p failed cardioversion), DM, HTN and OSA on CPAP.   Discharged from the hospital 01/20/13 for [facility] HF. He diursed 40 lbs and required lasix gtt. Discharge weight was 276 lbs.   Apparently had been doing well until a few weeks ago. Weight was down in the 250s. However over past few weeks has gained over 30 pounds and hit 289 pounds. Felt nauseated and also noted pruritic rash on trunk so went to see Dr. Virgina Jock. Labs revealed elevated transaminases in 500s with bilirubin in 3s. Admitted for liver failure/HF.   Remains on lasix gtt 30 mg/hr and metolazone 5 mg BID. 24 hr I/O - 7.1 liters and weight down 14 lbs. Renal function stable. Denies SOB, orthopnea or CP. Remains on milrinone 0.375 mcg.   Scheduled Meds: . amiodarone  400 mg Oral BID  . antiseptic oral rinse  7 mL Mouth Rinse BID  . aspirin EC  81 mg Oral Daily  . hydrocerin   Topical BID  . Influenza vac split quadrivalent PF  0.5 mL Intramuscular Tomorrow-1000  . insulin aspart  0-15 Units Subcutaneous TID WC  . insulin glargine  15 Units Subcutaneous BID  . linagliptin  5 mg Oral Daily  . metolazone  5 mg Oral BID  . metoprolol succinate  25 mg Oral BID  . pneumococcal 23 valent vaccine  0.5 mL Intramuscular Tomorrow-1000  . potassium chloride SA  40 mEq Oral TID  . sodium chloride  10-40 mL Intracatheter Q12H  . sodium chloride  3 mL Intravenous Q12H  . spironolactone  12.5 mg Oral Daily  . valACYclovir  1,000 mg Oral BID  . Warfarin - Pharmacist Dosing Inpatient   Does not apply q1800   Continuous Infusions: . sodium chloride 10 mL/hr at 12/29/13 0743  . furosemide (LASIX) infusion 30 mg/hr (01/02/14 0712)  . milrinone 0.375 mcg/kg/min (01/02/14 0325)   PRN Meds:.dextromethorphan-guaiFENesin,  LORazepam, ondansetron (ZOFRAN) IV, ondansetron, sodium chloride, sodium chloride   Filed Vitals:   01/01/14 2356 01/02/14 0350 01/02/14 0359 01/02/14 0439  BP: 95/75   117/76  Pulse: 112 104  126  Temp: 98.1 F (36.7 C) 97.4 F (36.3 C)    TempSrc: Oral Axillary    Resp: 17 18    Height:      Weight:   266 lb 12.1 oz (121 kg)   SpO2: 99% 94%      Intake/Output Summary (Last 24 hours) at 01/02/14 0742 Last data filed at 01/02/14 0700  Gross per 24 hour  Intake   2395 ml  Output   9525 ml  Net  -7130 ml    LABS: Basic Metabolic Panel:  Recent Labs  12/31/13 0407  01/01/14 0415 01/02/14 0455  NA 125*  < > 134* 141  K 2.8*  < > 3.1* 3.6*  CL 81*  --  84* 86*  CO2 30  --  39* 44*  GLUCOSE 346*  --  160* 131*  BUN 58*  --  53* 44*  CREATININE 1.81*  --  1.72* 1.60*  CALCIUM 7.8*  --  9.2 9.7  MG 2.0  --   --   --   < > = values in this interval not displayed. Liver Function Tests:  Recent Labs  01/01/14 0415  01/02/14 0455  AST 407* 272*  ALT 451* 369*  ALKPHOS 163* 151*  BILITOT 3.1* 3.3*  PROT 7.4 7.5  ALBUMIN 3.2* 3.3*   No results found for this basename: LIPASE, AMYLASE,  in the last 72 hours CBC:  Recent Labs  01/01/14 0415 01/02/14 0455  WBC 9.1 10.3  HGB 13.7 14.5  HCT 40.1 42.7  MCV 91.1 92.6  PLT 306 336   Cardiac Enzymes: No results found for this basename: CKTOTAL, CKMB, CKMBINDEX, TROPONINI,  in the last 72 hours BNP: No components found with this basename: POCBNP,  D-Dimer: No results found for this basename: DDIMER,  in the last 72 hours Hemoglobin A1C:  Recent Labs  12/31/13 0407  HGBA1C 7.7*   Fasting Lipid Panel: No results found for this basename: CHOL, HDL, LDLCALC, TRIG, CHOLHDL, LDLDIRECT,  in the last 72 hours Thyroid Function Tests: No results found for this basename: TSH, T4TOTAL, FREET3, T3FREE, THYROIDAB,  in the last 72 hours Anemia Panel: No results found for this basename: VITAMINB12, FOLATE, FERRITIN,  TIBC, IRON, RETICCTPCT,  in the last 72 hours  RADIOLOGY: Dg Chest Port 1 View  12/28/2013   CLINICAL DATA:  Line placement.  Subsequent encounter.  EXAM: PORTABLE CHEST - 1 VIEW  COMPARISON:  Chest radiograph 01/15/2013.  FINDINGS: Cardiomegaly. LEFT upper extremity PICC is present with the tip in the mid to lower SVC, approximately 1 vertebral body inferior to the carina. Lung volumes are low. Monitoring leads project over the chest. Tubing projects over the LEFT chest.  IMPRESSION: New LEFT upper extremity PICC with the tip in the mid to lower SVC.   Electronically Signed   By: Dereck Ligas M.D.   On: 12/28/2013 15:53    PHYSICAL EXAM CVP 9 General: NAD Neck: JVP difficult to assess d/t body habitus but mildly elevated, no thyromegaly or thyroid nodule.  Lungs: Clear to auscultation bilaterally with normal respiratory effort. CV: Nondisplaced PMI.  Heart mildly tachy, irregular S1/S2, no S3/S4, no murmur.  1+ Woody edema.  Abdomen: nontender, no hepatosplenomegaly,  + distention.  Neurologic: Alert and oriented x 3.  Psych: Normal affect. Extremities: No clubbing or cyanosis.  Skin: vesicular-appearing rash across back.    TELEMETRY: Reviewed telemetry pt in atrial fibrillation rate 120s   Assessment:    1. Volume overload  2. Acute on chronic systolic biventricular HF: Nonischemic cardiomyopathy - EF 15-20%. RV moderately HK 3. Chronic AF now with RVR  4. Acute liver failure - HIV/hepatitis panels negative  5. DM2  6. Morbid obesity  7. AKI on CKD 8. Hypokalemia/hyponatremia 9. Rash - zoster PCR pending  Plan/Discussion:    Stable overnight and continues to have brisk diuresis. Weight down another 14 lbs and 24 hr I/O -7.1 liters. CVP 9 and urine remains very clear. Will cut lasix gtt back to 20 mg/hr and give only one dose of 5 mg metolazone today. CO2 44 will give diamox 500 mg BID today. Continue milrinone 0.375 mcg and will try to wean down tomorrow.   K+ 3.6 will  supplement. AF rate remains elevated 115-120s. Will increase Toprol to 50 mg BID.   Ambulate in the halls. Hopefully home by Friday.   Rande Brunt, NP-C 7:42 AM   Patient seen and examined with Junie Bame, NP. We discussed all aspects of the encounter. I agree with the assessment and plan as stated above.   Continues to diurese briskly. Weight down 30 pounds in 2 days. Feeling better but remains frustrated.  AF rate improved but still elevated. Agree with increasing Toprol. Agree with diamox. Hopefully can try to wean milrinone soon. Would not pursue AV node ablation + biV pacer at this point.   Rubi Tooley,MD 9:10 AM
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
## 2014-01-02 NOTE — Discharge Instructions (Signed)
°Information on my medicine - Coumadin®   (Warfarin) ° °This medication education was reviewed with me or my healthcare representative as part of my discharge preparation.  The pharmacist that spoke with me during my hospital stay was:  Tomesha Sargent Rhea, RPH ° °Why was Coumadin prescribed for you? °Coumadin was prescribed for you because you have a blood clot or a medical condition that can cause an increased risk of forming blood clots. Blood clots can cause serious health problems by blocking the flow of blood to the heart, lung, or brain. Coumadin can prevent harmful blood clots from forming. °As a reminder your indication for Coumadin is:   Stroke Prevention Because Of Atrial Fibrillation ° °What test will check on my response to Coumadin? °While on Coumadin (warfarin) you will need to have an INR test regularly to ensure that your dose is keeping you in the desired range. The INR (international normalized ratio) number is calculated from the result of the laboratory test called prothrombin time (PT). ° °If an INR APPOINTMENT HAS NOT ALREADY BEEN MADE FOR YOU please schedule an appointment to have this lab work done by your health care provider within 7 days. °Your INR goal is usually a number between:  2 to 3 or your provider may give you a more narrow range like 2-2.5.  Ask your health care provider during an office visit what your goal INR is. ° °What  do you need to  know  About  COUMADIN? °Take Coumadin (warfarin) exactly as prescribed by your healthcare provider about the same time each day.  DO NOT stop taking without talking to the doctor who prescribed the medication.  Stopping without other blood clot prevention medication to take the place of Coumadin may increase your risk of developing a new clot or stroke.  Get refills before you run out. ° °What do you do if you miss a dose? °If you miss a dose, take it as soon as you remember on the same day then continue your regularly scheduled regimen the  next day.  Do not take two doses of Coumadin at the same time. ° °Important Safety Information °A possible side effect of Coumadin (Warfarin) is an increased risk of bleeding. You should call your healthcare provider right away if you experience any of the following: °  Bleeding from an injury or your nose that does not stop. °  Unusual colored urine (red or dark brown) or unusual colored stools (red or black). °  Unusual bruising for unknown reasons. °  A serious fall or if you hit your head (even if there is no bleeding). ° °Some foods or medicines interact with Coumadin® (warfarin) and might alter your response to warfarin. To help avoid this: °  Eat a balanced diet, maintaining a consistent amount of Vitamin K. °  Notify your provider about major diet changes you plan to make. °  Avoid alcohol or limit your intake to 1 drink for women and 2 drinks for men per day. °(1 drink is 5 oz. wine, 12 oz. beer, or 1.5 oz. liquor.) ° °Make sure that ANY health care provider who prescribes medication for you knows that you are taking Coumadin (warfarin).  Also make sure the healthcare provider who is monitoring your Coumadin knows when you have started a new medication including herbals and non-prescription products. ° °Coumadin® (Warfarin)  Major Drug Interactions  °Increased Warfarin Effect Decreased Warfarin Effect  °Alcohol (large quantities) °Antibiotics (esp. Septra/Bactrim, Flagyl, Cipro) °Amiodarone (Cordarone) °Aspirin (  ASA) °Cimetidine (Tagamet) °Megestrol (Megace) °NSAIDs (ibuprofen, naproxen, etc.) °Piroxicam (Feldene) °Propafenone (Rythmol SR) °Propranolol (Inderal) °Isoniazid (INH) °Posaconazole (Noxafil) Barbiturates (Phenobarbital) °Carbamazepine (Tegretol) °Chlordiazepoxide (Librium) °Cholestyramine (Questran) °Griseofulvin °Oral Contraceptives °Rifampin °Sucralfate (Carafate) °Vitamin K  ° °Coumadin® (Warfarin) Major Herbal Interactions  °Increased Warfarin Effect Decreased Warfarin Effect   °Garlic °Ginseng °Ginkgo biloba Coenzyme Q10 °Green tea °St. John’s wort   ° °Coumadin® (Warfarin) FOOD Interactions  °Eat a consistent number of servings per week of foods HIGH in Vitamin K °(1 serving = ½ cup)  °Collards (cooked, or boiled & drained) °Kale (cooked, or boiled & drained) °Mustard greens (cooked, or boiled & drained) °Parsley *serving size only = ¼ cup °Spinach (cooked, or boiled & drained) °Swiss chard (cooked, or boiled & drained) °Turnip greens (cooked, or boiled & drained)  °Eat a consistent number of servings per week of foods MEDIUM-HIGH in Vitamin K °(1 serving = 1 cup)  °Asparagus (cooked, or boiled & drained) °Broccoli (cooked, boiled & drained, or raw & chopped) °Brussel sprouts (cooked, or boiled & drained) *serving size only = ½ cup °Lettuce, raw (green leaf, endive, romaine) °Spinach, raw °Turnip greens, raw & chopped  ° °These websites have more information on Coumadin (warfarin):  www.coumadin.com; °www.ahrq.gov/consumer/coumadin.htm; ° ° ° °

## 2014-01-02 NOTE — Evaluation (Signed)
Occupational Therapy Evaluation Patient Details Name: Raymond Pratt MRN: [id number] DOB: Jan 12, 1951 Today's Date: 01/02/2014    History of Present Illness pt presents with SOB, CHF, and A-fib.     Clinical Impression   Pt is performing ADL and ADL transfers at an independent to supervision level.  Instructed in energy conservation. No further OT needs.  Follow Up Recommendations  No OT follow up;Supervision - Intermittent    Equipment Recommendations       Recommendations for Other Services       Precautions / Restrictions Precautions Precautions: None Precaution Comments: Watch HR Restrictions Weight Bearing Restrictions: No      Mobility Bed Mobility               General bed mobility comments: pt sitting EOB.    Transfers Overall transfer level: Needs assistance Equipment used: None Transfers: Sit to/from Stand Sit to Stand: Supervision         General transfer comment: pt demos good safety    Balance Overall balance assessment: Needs assistance Sitting-balance support: No upper extremity supported;Feet supported Sitting balance-Leahy Scale: Good     Standing balance support: No upper extremity supported Standing balance-Leahy Scale: Fair                              ADL Overall ADL's : Needs assistance/impaired Eating/Feeding: Independent;Sitting   Grooming: Wash/dry hands;Standing;Supervision/safety   Upper Body Bathing: Set up;Sitting   Lower Body Bathing: Sit to/from stand;Independent   Upper Body Dressing : Set up;Sitting   Lower Body Dressing: Sit to/from stand;Independent   Toilet Transfer:  (stood to use urinal independently)   Toileting- Water quality scientist and Hygiene: Independent;Sit to/from stand         General ADL Comments: Pt brings his LE up on the bed to reach his feet to donn socks. Instructed in energy conservation strategies.     Vision                     Perception     Praxis       Pertinent Vitals/Pain Pain Assessment: No/denies pain     Hand Dominance Right   Extremity/Trunk Assessment Upper Extremity Assessment Upper Extremity Assessment: Overall WFL for tasks assessed   Lower Extremity Assessment Lower Extremity Assessment: Defer to PT evaluation   Cervical / Trunk Assessment Cervical / Trunk Assessment: Normal   Communication Communication Communication: No difficulties   Cognition Arousal/Alertness: Awake/alert Behavior During Therapy: Flat affect (low energy) Overall Cognitive Status: Within Functional Limits for tasks assessed                     General Comments       Exercises       Shoulder Instructions      Home Living Family/patient expects to be discharged to:: Private residence Living Arrangements: Alone Available Help at Discharge: Family;Available PRN/intermittently Type of Home: House Home Access: Stairs to enter Entrance Stairs-Number of Steps: 2   Home Layout: Two level;Able to live on main level with bedroom/bathroom     Bathroom Shower/Tub: Teacher, early years/pre: Standard     Home Equipment: Crutches;Cane - single point   Additional Comments: pt notes he can stay at his daughter's home when he needs help.        Prior Functioning/Environment Level of Independence: Independent             OT Diagnosis:  OT Problem List:     OT Treatment/Interventions:      OT Goals(Current goals can be found in the care plan section) Acute Rehab OT Goals Patient Stated Goal: To get back outside.    OT Frequency:     Barriers to D/C:            Co-evaluation              End of Session    Activity Tolerance: Patient tolerated treatment well Patient left: in bed;with call bell/phone within reach;with nursing/sitter in room   Time: 1232-1255 OT Time Calculation (min): 23 min Charges:  OT General Charges $OT Visit: 1 Procedure OT Evaluation $Initial OT Evaluation Tier I: 1  Procedure OT Treatments $Self Care/Home Management : 8-22 mins G-Codes:    Malka So 01/02/2014, 12:59 PM [phone]

## 2014-01-02 NOTE — Progress Notes (Signed)
Asked pt about wearing CPAP tonight and he states that he doesn't get much sleep and has to get up throughout the night and take it off. Educated pt on importance, and encouraged him to call if he changes his mind about wearing the CPAP, he communicates understanding.

## 2014-01-03 LAB — COMPREHENSIVE METABOLIC PANEL
ALT: 291 U/L — ABNORMAL HIGH (ref 0–53)
AST: 162 U/L — ABNORMAL HIGH (ref 0–37)
Albumin: 3.3 g/dL — ABNORMAL LOW (ref 3.5–5.2)
Alkaline Phosphatase: 137 U/L — ABNORMAL HIGH (ref 39–117)
Anion gap: 13 (ref 5–15)
BUN: 45 mg/dL — ABNORMAL HIGH (ref 6–23)
CO2: 40 mEq/L (ref 19–32)
Calcium: 9.5 mg/dL (ref 8.4–10.5)
Chloride: 79 mEq/L — ABNORMAL LOW (ref 96–112)
Creatinine, Ser: 1.77 mg/dL — ABNORMAL HIGH (ref 0.50–1.35)
GFR calc Af Amer: 46 mL/min — ABNORMAL LOW (ref >90)
GFR calc non Af Amer: 39 mL/min — ABNORMAL LOW (ref >90)
Glucose, Bld: 342 mg/dL — ABNORMAL HIGH (ref 70–99)
Potassium: 3.7 mEq/L (ref 3.7–5.3)
Sodium: 132 mEq/L — ABNORMAL LOW (ref 137–147)
Total Bilirubin: 3 mg/dL — ABNORMAL HIGH (ref 0.3–1.2)
Total Protein: 7.8 g/dL (ref 6.0–8.3)

## 2014-01-03 LAB — CBC
HCT: 42.5 % (ref 39.0–52.0)
Hemoglobin: 14.1 g/dL (ref 13.0–17.0)
MCH: 30.6 pg (ref 26.0–34.0)
MCHC: 33.2 g/dL (ref 30.0–36.0)
MCV: 92.2 fL (ref 78.0–100.0)
Platelets: 365 10*3/uL (ref 150–400)
RBC: 4.61 MIL/uL (ref 4.22–5.81)
RDW: 15.5 % (ref 11.5–15.5)
WBC: 10.5 10*3/uL (ref 4.0–10.5)

## 2014-01-03 LAB — GLUCOSE, CAPILLARY
GLUCOSE-CAPILLARY: 298 mg/dL — AB (ref 70–99)
Glucose-Capillary: 199 mg/dL — ABNORMAL HIGH (ref 70–99)
Glucose-Capillary: 316 mg/dL — ABNORMAL HIGH (ref 70–99)
Glucose-Capillary: 143 mg/dL — ABNORMAL HIGH (ref 70–99)

## 2014-01-03 LAB — CARBOXYHEMOGLOBIN
Carboxyhemoglobin: 1.7 % — ABNORMAL HIGH (ref 0.5–1.5)
Methemoglobin: 0.8 % (ref 0.0–1.5)
O2 Saturation: 73.6 %
Total hemoglobin: 14.6 g/dL (ref 13.5–18.0)

## 2014-01-03 LAB — PROTIME-INR
INR: 2.13 — ABNORMAL HIGH (ref 0.00–1.49)
Prothrombin Time: 24 seconds — ABNORMAL HIGH (ref 11.6–15.2)

## 2014-01-03 MED ORDER — ACETAZOLAMIDE ER 500 MG PO CP12
500.0000 mg | ORAL_CAPSULE | Freq: Two times a day (BID) | ORAL | Status: DC
  Filled 2014-01-03 (×2): qty 1

## 2014-01-03 MED ORDER — WARFARIN SODIUM 5 MG PO TABS
5.0000 mg | ORAL_TABLET | Freq: Once | ORAL | Status: AC
  Administered 2014-01-03: 5 mg via ORAL
  Filled 2014-01-03: qty 1

## 2014-01-03 MED ORDER — MILRINONE IN DEXTROSE 20 MG/100ML IV SOLN
0.1250 ug/kg/min | INTRAVENOUS | Status: DC
  Administered 2014-01-03: 0.25 ug/kg/min via INTRAVENOUS
  Administered 2014-01-04 – 2014-01-05 (×2): 0.125 ug/kg/min via INTRAVENOUS
  Filled 2014-01-03 (×3): qty 100

## 2014-01-03 MED ORDER — DIGOXIN 125 MCG PO TABS
0.1250 mg | ORAL_TABLET | Freq: Every day | ORAL | Status: DC
  Administered 2014-01-03 – 2014-01-07 (×5): 0.125 mg via ORAL
  Filled 2014-01-03 (×5): qty 1

## 2014-01-03 NOTE — Progress Notes (Signed)
Subjective: Admitted c Shock Liver d/t R CHF.  May have been brought on by Viral Vesicular rash and AFib RVR. He looks lots better than Friday. LFTs improving Wt 285# on admit in my office (higher in house) - 259 now.   ie 26-30# wt loss S/p Brisk Diuresis. Now hypotensive and Tachycardic - somehow tolerating this.  Objective: Vital signs in last 24 hours: Temp:  [97.2 F (36.2 C)-98.7 F (37.1 C)] 97.8 F (36.6 C) (10/29 0732) Pulse Rate:  [61-130] 122 (10/29 0732) Resp:  [18-20] 18 (10/29 0400) BP: (51-105)/(29-64) 100/62 mmHg (10/29 0750) SpO2:  [93 %-97 %] 94 % (10/29 0732) Weight:  [117.482 kg (259 lb)] 117.482 kg (259 lb) (10/29 0600) Weight change: -3.518 kg (-7 lb 12.1 oz) Last BM Date: 01/02/14  Intake/Output from previous day: 10/28 0701 - 10/29 0700 In: 2038 [P.O.:360; I.V.:1678] Out: 5625 [Urine:5625] Intake/Output this shift:    General appearance: alert, cooperative and appears stated age Resp: clear to auscultation bilaterally Cardio: tachy, no sig. m/r/g GI: soft, non-tender; bowel sounds normal; no masses,  no organomegaly, Obese Extremities: extremities normal, atraumatic, no cyanosis or edema Neurologic: Grossly normal Skin clearly better Skin coloring much better than friday   Lab Results:  Recent Labs  01/02/14 0455 01/03/14 0500  WBC 10.3 10.5  HGB 14.5 14.1  HCT 42.7 42.5  PLT 336 365   BMET  Recent Labs  01/02/14 0455 01/03/14 0500  NA 141 132*  K 3.6* 3.7  CL 86* 79*  CO2 44* 40*  GLUCOSE 131* 342*  BUN 44* 45*  CREATININE 1.60* 1.77*  CALCIUM 9.7 9.5   CMET CMP     Component Value Date/Time   NA 132* 01/03/2014 0500   K 3.7 01/03/2014 0500   CL 79* 01/03/2014 0500   CO2 40* 01/03/2014 0500   GLUCOSE 342* 01/03/2014 0500   BUN 45* 01/03/2014 0500   CREATININE 1.77* 01/03/2014 0500   CALCIUM 9.5 01/03/2014 0500   PROT 7.8 01/03/2014 0500   ALBUMIN 3.3* 01/03/2014 0500   AST 162* 01/03/2014 0500   ALT 291*  01/03/2014 0500   ALKPHOS 137* 01/03/2014 0500   BILITOT 3.0* 01/03/2014 0500   GFRNONAA 39* 01/03/2014 0500   GFRAA 46* 01/03/2014 0500     Studies/Results: No results found.  Medications: I have reviewed the patient's current medications.  Marland Kitchen amiodarone  400 mg Oral BID  . antiseptic oral rinse  7 mL Mouth Rinse BID  . aspirin EC  81 mg Oral Daily  . hydrocerin   Topical BID  . Influenza vac split quadrivalent PF  0.5 mL Intramuscular Tomorrow-1000  . insulin aspart  0-15 Units Subcutaneous TID WC  . insulin glargine  15 Units Subcutaneous BID  . linagliptin  5 mg Oral Daily  . metolazone  5 mg Oral Daily  . metoprolol succinate  50 mg Oral BID  . pneumococcal 23 valent vaccine  0.5 mL Intramuscular Tomorrow-1000  . potassium chloride SA  40 mEq Oral TID  . predniSONE  40 mg Oral Q breakfast  . sodium chloride  10-40 mL Intracatheter Q12H  . sodium chloride  3 mL Intravenous Q12H  . spironolactone  12.5 mg Oral Daily  . valACYclovir  1,000 mg Oral BID  . Warfarin - Pharmacist Dosing Inpatient   Does not apply q1800   Lab Results  Component Value Date   INR 2.13* 01/03/2014   INR 1.91* 01/02/2014   INR 2.17* 01/01/2014     Assessment/Plan:  Acute liver failure- due to passive liver congestion due to R > L acute on chronic chf.  AST and ALT improving nicely. INR therapeutic.  CT showed no bile obstruction or other obvious cause of abnl LFT. Continue current meds.  NICCM - Acute on chronic heart failure c sig Vol Overload- per cardiology. Will ask cards to move him to their service.  Clearly improving and doing better much better - weight down near baseline.  EF 15-20% on 12/29/13 by ECHO.   lasix 30/hr - may need to place on hold c the low BP - May need fluid back? Except CVP still up some.  Milrinone. Hold Metolazone.  UF not needed.  Will discus c Ht Failure team.  Worsening renal function - Cr 1.7 - 1.8 today.  Following Cr.  Watch for ATN c low BP  Lactic Acidosis  from Hypoperfusion d/t low CO CHF which was source of his ab pain in my office Ammonia was only 10 - good. Afib RVR - Amio/toprol - Cards holding on Ablation BiV Pacer. INR goal 2-3 Possible zoster- IgM Abs (-). Finish valtrex after 7 days.  Hepatitis profile (-), (-) HIV Ab,  Viral cx P.  Off isolation and skin improved  Protein Cal Malnutrition - push diet. DM2- ISS and Lantus. Continue Tradjenta.  CBGs fine 150-240 Hypokalemia - Repleted 4 mm nonobstructive calculus in the lower pole collecting system of the right kidney - NTD 10 x 8 mm left lower lobe pulmonary nodule. A short-term follow-up chest CT is recommended in 3 months to ensure the stability or resolution of this finding, as neoplasm is not excluded. Coronary Atherosclerosis - seen on CT.  RF modify as able. Goal INR 2-3 OSA - CPAP   LOS: 6 days   Precious Reel, MD 01/03/2014, 7:53 AM

## 2014-01-03 NOTE — Progress Notes (Signed)
Patient found sitting on side of bed stated "I feel like I'm having a panic attack", no visible respiratory distress noted. Patient was itching bilateral arms due to dry rash present. I gave the patient his PRN dose of 0.5mg  ativan, placed his oxygen back on him at 2L and applied his prescribed Eucerin cream to bilateral arms. His gown, bed pad and blankets were changed and the patient was repositioned in the bed to a more comfortable position. He made several statements about wanting to go home and felt he was being lied to about how long his actual length of stay in the hospital would be.
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
## 2014-01-03 NOTE — Progress Notes (Signed)
Patient ID: Raymond Pratt, male   DOB: 03-15-1950, 63 y.o.   MRN: [id number]   Raymond Pratt is a 63 y.o. morbidly obese male with h/o chronic combined systolic and diastolic CHF ([id number]) Echo: EF 20-25%, chronic AF (2010 s/p failed cardioversion), DM, HTN and OSA on CPAP.   Discharged from the hospital 01/20/13 for [facility] HF. He diursed 40 lbs and required lasix gtt. Discharge weight was 276 lbs.   Apparently had been doing well until a few weeks ago. Weight was down in the 250s. However over past few weeks has gained over 30 pounds and hit 289 pounds. Felt nauseated and also noted pruritic rash on trunk so went to see Dr. Virgina Jock. Labs revealed elevated transaminases in 500s with bilirubin in 3s. Admitted for liver failure/HF.   Lasix gtt decreased to 20 mg/hr and metolazone 5 mg once yesterday. He also received diamox for elevated CO2. Weight down 7 lbs and 24 hr I/O - 3.5 liters. Creatinine slightly elevated but BUN stable. Denies SOB, orthopnea or CP. CVP 13. BP recorded as 50 this am but was 100 when checked manually.   Scheduled Meds: . amiodarone  400 mg Oral BID  . antiseptic oral rinse  7 mL Mouth Rinse BID  . aspirin EC  81 mg Oral Daily  . hydrocerin   Topical BID  . Influenza vac split quadrivalent PF  0.5 mL Intramuscular Tomorrow-1000  . insulin aspart  0-15 Units Subcutaneous TID WC  . insulin glargine  15 Units Subcutaneous BID  . linagliptin  5 mg Oral Daily  . metolazone  5 mg Oral Daily  . metoprolol succinate  50 mg Oral BID  . pneumococcal 23 valent vaccine  0.5 mL Intramuscular Tomorrow-1000  . potassium chloride SA  40 mEq Oral TID  . predniSONE  40 mg Oral Q breakfast  . sodium chloride  10-40 mL Intracatheter Q12H  . sodium chloride  3 mL Intravenous Q12H  . spironolactone  12.5 mg Oral Daily  . valACYclovir  1,000 mg Oral BID  . Warfarin - Pharmacist Dosing Inpatient   Does not apply q1800   Continuous Infusions: . sodium chloride 10 mL/hr at 01/02/14 2000    . furosemide (LASIX) infusion 20 mg/hr (01/02/14 2000)  . milrinone 0.375 mcg/kg/min (01/03/14 0725)   PRN Meds:.dextromethorphan-guaiFENesin, LORazepam, ondansetron (ZOFRAN) IV, ondansetron, sodium chloride, sodium chloride   Filed Vitals:   01/03/14 0400 01/03/14 0600 01/03/14 0732 01/03/14 0750  BP: 93/63  51/29 100/62  Pulse:   122   Temp:   97.8 F (36.6 C)   TempSrc:   Oral   Resp: 18     Height:  5\' 7"  (1.702 m)    Weight:  259 lb (117.482 kg)    SpO2: 94%  94%     Intake/Output Summary (Last 24 hours) at 01/03/14 0750 Last data filed at 01/03/14 0700  Gross per 24 hour  Intake   2038 ml  Output   5625 ml  Net  -3587 ml    LABS: Basic Metabolic Panel:  Recent Labs  01/02/14 0455 01/03/14 0500  NA 141 132*  K 3.6* 3.7  CL 86* 79*  CO2 44* 40*  GLUCOSE 131* 342*  BUN 44* 45*  CREATININE 1.60* 1.77*  CALCIUM 9.7 9.5   Liver Function Tests:  Recent Labs  01/02/14 0455 01/03/14 0500  AST 272* 162*  ALT 369* 291*  ALKPHOS 151* 137*  BILITOT 3.3* 3.0*  PROT 7.5 7.8  ALBUMIN 3.3* 3.3*  No results found for this basename: LIPASE, AMYLASE,  in the last 72 hours CBC:  Recent Labs  01/02/14 0455 01/03/14 0500  WBC 10.3 10.5  HGB 14.5 14.1  HCT 42.7 42.5  MCV 92.6 92.2  PLT 336 365   Cardiac Enzymes: No results found for this basename: CKTOTAL, CKMB, CKMBINDEX, TROPONINI,  in the last 72 hours BNP: No components found with this basename: POCBNP,  D-Dimer: No results found for this basename: DDIMER,  in the last 72 hours Hemoglobin A1C: No results found for this basename: HGBA1C,  in the last 72 hours Fasting Lipid Panel: No results found for this basename: CHOL, HDL, LDLCALC, TRIG, CHOLHDL, LDLDIRECT,  in the last 72 hours Thyroid Function Tests: No results found for this basename: TSH, T4TOTAL, FREET3, T3FREE, THYROIDAB,  in the last 72 hours Anemia Panel: No results found for this basename: VITAMINB12, FOLATE, FERRITIN, TIBC, IRON,  RETICCTPCT,  in the last 72 hours  RADIOLOGY: Dg Chest Port 1 View  12/28/2013   CLINICAL DATA:  Line placement.  Subsequent encounter.  EXAM: PORTABLE CHEST - 1 VIEW  COMPARISON:  Chest radiograph 01/15/2013.  FINDINGS: Cardiomegaly. LEFT upper extremity PICC is present with the tip in the mid to lower SVC, approximately 1 vertebral body inferior to the carina. Lung volumes are low. Monitoring leads project over the chest. Tubing projects over the LEFT chest.  IMPRESSION: New LEFT upper extremity PICC with the tip in the mid to lower SVC.   Electronically Signed   By: Dereck Ligas M.D.   On: 12/28/2013 15:53    PHYSICAL EXAM CVP 9 General: NAD Neck: JVP difficult to assess d/t body habitus , no thyromegaly or thyroid nodule.  Lungs: Clear to auscultation bilaterally with normal respiratory effort. CV: Nondisplaced PMI.  Heart mildly tachy, irregular S1/S2, no S3/S4, no murmur.  1+ Woody edema.  Abdomen: nontender, no hepatosplenomegaly,  + distention.  Neurologic: Alert and oriented x 3.  Psych: Normal affect. Extremities: No clubbing or cyanosis.  Skin: vesicular-appearing rash across back.    TELEMETRY: Reviewed telemetry pt in atrial fibrillation rate 120s   Assessment:    1. Volume overload  2. Acute on chronic systolic biventricular HF: Nonischemic cardiomyopathy - EF 15-20%. RV moderately HK 3. Chronic AF now with RVR  4. Acute liver failure - HIV/hepatitis panels negative  5. DM2  6. Morbid obesity  7. AKI on CKD 8. Hypokalemia/hyponatremia 9. Rash - zoster PCR pending  Plan/Discussion:    Stable overnight. SBP running low on monitor but by manual cuff SBP 100s.   Continues to diurese and weight down another 6 lbs and 24 hr I/O -3.5 liters. His CVP remains elevated at 13. Creatinine is up slightly however BUN is stable. Will try one more day of IV lasix and one dose of metolazone. Co2 improved however remains elevated at 40 will give 2 more doses of diamox. Will  recheck BMET at 1300.   HR remains elevated. 63 is on his home dose of Toprol 50 mg BID. HR is probably increased also d/t milrinone. Will decrease milrinone to 0.25 mcg. Can't titrate BB d/t low BP.  Continue to ambulate in the halls.   Rande Brunt, NP-C 7:50 AM  Patient seen and examined with Junie Bame, NP. We discussed all aspects of the encounter. I agree with the assessment and plan as stated above.   CVP now 8. Would stop lasix and metolazone and restart oral diuretics tomorrow. Agree with weaning milrinone. Will need  to watch co-ox closely.   Main cardiac issue now is AF with RVR which is longstanding. Will continue amio. BP too low to titrate Toprol. Will start low-dose digoxin carefully. AVN ablation and CRT would be last option.   Celie Desrochers,MD 10:02 AM

## 2014-01-04 ENCOUNTER — Inpatient Hospital Stay (HOSPITAL_COMMUNITY): Payer: BC Managed Care – PPO

## 2014-01-04 LAB — COMPREHENSIVE METABOLIC PANEL
ALT: 216 U/L — AB (ref 0–53)
AST: 100 U/L — AB (ref 0–37)
Albumin: 3.3 g/dL — ABNORMAL LOW (ref 3.5–5.2)
Alkaline Phosphatase: 142 U/L — ABNORMAL HIGH (ref 39–117)
Anion gap: 13 (ref 5–15)
BUN: 57 mg/dL — ABNORMAL HIGH (ref 6–23)
CALCIUM: 9.5 mg/dL (ref 8.4–10.5)
CO2: 39 meq/L — AB (ref 19–32)
Chloride: 78 mEq/L — ABNORMAL LOW (ref 96–112)
Creatinine, Ser: 2.14 mg/dL — ABNORMAL HIGH (ref 0.50–1.35)
GFR calc Af Amer: 36 mL/min — ABNORMAL LOW (ref >90)
GFR calc non Af Amer: 31 mL/min — ABNORMAL LOW (ref >90)
Glucose, Bld: 148 mg/dL — ABNORMAL HIGH (ref 70–99)
Potassium: 3.2 mEq/L — ABNORMAL LOW (ref 3.7–5.3)
SODIUM: 130 meq/L — AB (ref 137–147)
TOTAL PROTEIN: 7.6 g/dL (ref 6.0–8.3)
Total Bilirubin: 2.3 mg/dL — ABNORMAL HIGH (ref 0.3–1.2)

## 2014-01-04 LAB — GLUCOSE, CAPILLARY
GLUCOSE-CAPILLARY: 154 mg/dL — AB (ref 70–99)
GLUCOSE-CAPILLARY: 225 mg/dL — AB (ref 70–99)
Glucose-Capillary: 249 mg/dL — ABNORMAL HIGH (ref 70–99)
Glucose-Capillary: 230 mg/dL — ABNORMAL HIGH (ref 70–99)

## 2014-01-04 LAB — URINALYSIS, ROUTINE W REFLEX MICROSCOPIC
Bilirubin Urine: NEGATIVE
Glucose, UA: NEGATIVE mg/dL
Hgb urine dipstick: NEGATIVE
Ketones, ur: NEGATIVE mg/dL
LEUKOCYTES UA: NEGATIVE
Nitrite: NEGATIVE
PROTEIN: NEGATIVE mg/dL
Specific Gravity, Urine: 1.014 (ref 1.005–1.030)
Urobilinogen, UA: 1 mg/dL (ref 0.0–1.0)
pH: 7.5 (ref 5.0–8.0)

## 2014-01-04 LAB — PROTIME-INR
INR: 2.43 — ABNORMAL HIGH (ref 0.00–1.49)
Prothrombin Time: 26.6 seconds — ABNORMAL HIGH (ref 11.6–15.2)

## 2014-01-04 LAB — CARBOXYHEMOGLOBIN
CARBOXYHEMOGLOBIN: 1.3 % (ref 0.5–1.5)
Methemoglobin: 0.9 % (ref 0.0–1.5)
O2 SAT: 64.2 %
Total hemoglobin: 14.9 g/dL (ref 13.5–18.0)

## 2014-01-04 LAB — CBC
HCT: 40.6 % (ref 39.0–52.0)
Hemoglobin: 13.8 g/dL (ref 13.0–17.0)
MCH: 30.6 pg (ref 26.0–34.0)
MCHC: 34 g/dL (ref 30.0–36.0)
MCV: 90 fL (ref 78.0–100.0)
PLATELETS: 363 10*3/uL (ref 150–400)
RBC: 4.51 MIL/uL (ref 4.22–5.81)
RDW: 15.5 % (ref 11.5–15.5)
WBC: 14.5 10*3/uL — ABNORMAL HIGH (ref 4.0–10.5)

## 2014-01-04 MED ORDER — AMIODARONE HCL 400 MG PO TABS: 400.0000 mg | ORAL_TABLET | Freq: Two times a day (BID) | ORAL | Status: DC

## 2014-01-04 MED ORDER — WARFARIN SODIUM 5 MG PO TABS
5.0000 mg | ORAL_TABLET | Freq: Once | ORAL | Status: AC
  Administered 2014-01-04: 5 mg via ORAL
  Filled 2014-01-04: qty 1

## 2014-01-04 MED ORDER — DM-GUAIFENESIN ER 30-600 MG PO TB12: 1.0000 | ORAL_TABLET | Freq: Two times a day (BID) | ORAL | Status: DC | PRN

## 2014-01-04 MED ORDER — TEMAZEPAM 15 MG PO CAPS
15.0000 mg | ORAL_CAPSULE | Freq: Every evening | ORAL | Status: DC | PRN
  Administered 2014-01-04 – 2014-01-06 (×3): 15 mg via ORAL
  Filled 2014-01-04 (×3): qty 1

## 2014-01-04 MED ORDER — POTASSIUM CHLORIDE CRYS ER 20 MEQ PO TBCR
40.0000 meq | EXTENDED_RELEASE_TABLET | Freq: Two times a day (BID) | ORAL | Status: AC
  Administered 2014-01-04 (×2): 40 meq via ORAL
  Filled 2014-01-04: qty 4

## 2014-01-04 MED ORDER — POTASSIUM CHLORIDE CRYS ER 20 MEQ PO TBCR
40.0000 meq | EXTENDED_RELEASE_TABLET | Freq: Once | ORAL | Status: AC
  Administered 2014-01-04: 40 meq via ORAL
  Filled 2014-01-04: qty 2
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
## 2014-01-04 NOTE — Progress Notes (Signed)
Inpatient Diabetes Program Recommendations  AACE/ADA: New Consensus Statement on Inpatient Glycemic Control (2013)  Target Ranges:  Prepandial:   less than 140 mg/dL      Peak postprandial:   less than 180 mg/dL (1-2 hours)      Critically ill patients:  140 - 180 mg/dL  Results for Raymond Pratt, Raymond Pratt (MRN [id number]) as of 01/04/2014 11:22  Ref. Range 01/03/2014 07:35 01/03/2014 11:51 01/03/2014 16:29 01/03/2014 21:26 01/04/2014 08:11  Glucose-Capillary Latest Range: 70-99 mg/dL 199 (H) 316 (H) 143 (H) 298 (H) 154 (H)   Inpatient Diabetes Program Recommendations Insulin - Meal Coverage: add Novolog 4 units TID with meals per Glycemic Control order set (should not be given if pt eats <50%) Diet: add carb modified to current heart healthy diet Thank you  Raoul Pitch BSN, RN,CDE Inpatient Diabetes Coordinator [phone] (team pager)

## 2014-01-04 NOTE — Progress Notes (Signed)
Subjective: Admitted c Shock Liver d/t R CHF.  May have been brought on by Viral Vesicular rash and AFib RVR. He looks lots better than Friday. LFTs improving Wt 285# on admit in my office (higher in house) - 259 10/29 and 263 today.   ie 26-30# wt loss S/p Brisk Diuresis.  BPs 100-120 Remains Tachycardic - tolerating this. Has not slept much. Up and down all night Some panic attack Sxs - given Ativan and O2. Cr up > 2. Only IV = Milrinone. He is not ready for D/c but if OK c Cards can we move to floor.  He is clearly frustrated and wants to go home More coughing, some sputum  Objective: Vital signs in last 24 hours: Temp:  [97.4 F (36.3 C)-98.2 F (36.8 C)] 97.4 F (36.3 C) (10/30 0341) Pulse Rate:  [121-133] 121 (10/30 0341) Resp:  [20] 20 (10/29 2000) BP: (51-114)/(29-82) 114/82 mmHg (10/30 0341) SpO2:  [94 %-96 %] 96 % (10/30 0341) Weight:  [119.3 kg (263 lb 0.1 oz)] 119.3 kg (263 lb 0.1 oz) (10/30 0600) Weight change: 1.818 kg (4 lb 0.1 oz) Last BM Date: 01/03/14  Intake/Output from previous day: 10/29 0701 - 10/30 0700 In: 1309.8 [P.O.:1040; I.V.:269.8] Out: 1000 [Urine:1000] Intake/Output this shift: Total I/O In: 186 [I.V.:186] Out: 575 [Urine:575]  General appearance: alert, cooperative and appears stated age Resp: clear to auscultation bilaterally Cardio: tachy, no sig. m/r/g GI: soft, non-tender; bowel sounds normal; no masses,  no organomegaly, Obese Extremities: extremities normal, atraumatic, no cyanosis or edema Neurologic: Grossly normal Skin clearly better Skin coloring much better than friday   Lab Results:  Recent Labs  01/03/14 0500 01/04/14 0400  WBC 10.5 14.5*  HGB 14.1 13.8  HCT 42.5 40.6  PLT 365 363   BMET  Recent Labs  01/03/14 0500 01/04/14 0400  NA 132* 130*  K 3.7 3.2*  CL 79* 78*  CO2 40* 39*  GLUCOSE 342* 148*  BUN 45* 57*  CREATININE 1.77* 2.14*  CALCIUM 9.5 9.5   CMET CMP     Component Value Date/Time   NA  130* 01/04/2014 0400   K 3.2* 01/04/2014 0400   CL 78* 01/04/2014 0400   CO2 39* 01/04/2014 0400   GLUCOSE 148* 01/04/2014 0400   BUN 57* 01/04/2014 0400   CREATININE 2.14* 01/04/2014 0400   CALCIUM 9.5 01/04/2014 0400   PROT 7.6 01/04/2014 0400   ALBUMIN 3.3* 01/04/2014 0400   AST 100* 01/04/2014 0400   ALT 216* 01/04/2014 0400   ALKPHOS 142* 01/04/2014 0400   BILITOT 2.3* 01/04/2014 0400   GFRNONAA 31* 01/04/2014 0400   GFRAA 36* 01/04/2014 0400     Studies/Results: No results found.  Medications: I have reviewed the patient's current medications.  Marland Kitchen amiodarone  400 mg Oral BID  . antiseptic oral rinse  7 mL Mouth Rinse BID  . aspirin EC  81 mg Oral Daily  . digoxin  0.125 mg Oral Daily  . hydrocerin   Topical BID  . insulin aspart  0-15 Units Subcutaneous TID WC  . insulin glargine  15 Units Subcutaneous BID  . linagliptin  5 mg Oral Daily  . metoprolol succinate  50 mg Oral BID  . predniSONE  40 mg Oral Q breakfast  . sodium chloride  10-40 mL Intracatheter Q12H  . sodium chloride  3 mL Intravenous Q12H  . spironolactone  12.5 mg Oral Daily  . valACYclovir  1,000 mg Oral BID  . Warfarin - Pharmacist Dosing  Inpatient   Does not apply q1800   Lab Results  Component Value Date   INR 2.43* 01/04/2014   INR 2.13* 01/03/2014   INR 1.91* 01/02/2014     Assessment/Plan:  Acute liver failure- due to passive liver congestion due to R > L acute on chronic chf.  AST and ALT improving nicely 100/216 respectively. INR therapeutic.  CT showed no bile obstruction or other obvious cause of abnl LFT. Continue current meds.  Leukocytosis - WBC shot up to 14.5 today - c cough/sputum - already on Mucinex.  Check PA and Lat CXR  Insomnia - Resoril 15 HS added.  NICCM - Acute on chronic heart failure c sig Vol Overload- per cardiology. Will ask cards to move him to their service.  Had been improving and doing better much better - Lasix gtt and metalozone sopped.  Milrinone  decreased.  Weight down near baseline but up a few pounds since yesterday.  BPs need to be obtained manually and OK.  EF 15-20% on 12/29/13 by ECHO.    Except CVP 10-13 and Co-Ox 64. Cr up to 2.14.  UF not needed.  Will discus c Ht Failure team.  I do not think he is ready for D/c and needs 1-2 more days?? - ? Transfer to floor.  Worsening renal function - Cr 1.7 - 1.8 - 2.19.  Following Cr.    Anxiety - Ativan  Lactic Acidosis from Hypoperfusion d/t low CO CHF which was source of his ab pain in my office Ammonia was only 10 - good. Afib RVR - Amio/toprol = 110-120 HR  - Cards holding on Ablation BiV Pacer. INR goal 2-3 Zoster Ruled out- IgM Abs (-). Finish valtrex after 7 days.  Hepatitis profile (-), (-) HIV Ab,  Viral cx (-), Swab (-)  Off isolation and skin improved.  His skin lesions were probably excoriations from Elevated T Bili c pruritis.  Current T Bili 2.3 Protein Cal Malnutrition - push diet. DM2- ISS and Lantus. Continue Tradjenta.  CBGs fine c one sig elevation after fruit Hypokalemia - Needs more Repletion 4 mm nonobstructive calculus in the lower pole collecting system of the right kidney - NTD 10 x 8 mm left lower lobe pulmonary nodule. A short-term follow-up chest CT is recommended in 3 months to ensure the stability or resolution of this finding, as neoplasm is not excluded. Coronary Atherosclerosis - seen on CT.  RF modify as able. Goal INR 2-3 OSA - CPAP   LOS: 7 days   Precious Reel, MD 01/04/2014, 6:31 AM

## 2014-01-04 NOTE — Progress Notes (Addendum)
Patient ID: Raymond Pratt, male   DOB: 06/11/1950, 63 y.o.   MRN: [id number]   Vern Fithen is a 63 y.o. morbidly obese male with h/o chronic combined systolic and diastolic CHF ([id number]) Echo: EF 20-25%, chronic AF (2010 s/p failed cardioversion), DM, HTN and OSA on CPAP.   Discharged from the hospital 01/20/13 for [facility] HF. He diursed 40 lbs and required lasix gtt. Discharge weight was 276 lbs.   Apparently had been doing well until a few weeks ago. Weight was down in the 250s. However over past few weeks has gained over 30 pounds and hit 289 pounds. Felt nauseated and also noted pruritic rash on trunk so went to see Dr. Virgina Jock. Labs revealed elevated transaminases in 500s with bilirubin in 3s. Admitted for liver failure/HF.   Lasix gtt stopped as well as metolazone yesterday. Milrinone decreased to 0. 25 mcg and co-ox 64%. Weight up 4 lbs. Renal function up slightly. This am WBC up and has pending blood cx and urine cx. CVP 8    Scheduled Meds: . amiodarone  400 mg Oral BID  . antiseptic oral rinse  7 mL Mouth Rinse BID  . aspirin EC  81 mg Oral Daily  . digoxin  0.125 mg Oral Daily  . hydrocerin   Topical BID  . insulin aspart  0-15 Units Subcutaneous TID WC  . insulin glargine  15 Units Subcutaneous BID  . linagliptin  5 mg Oral Daily  . metoprolol succinate  50 mg Oral BID  . predniSONE  40 mg Oral Q breakfast  . sodium chloride  10-40 mL Intracatheter Q12H  . sodium chloride  3 mL Intravenous Q12H  . spironolactone  12.5 mg Oral Daily  . valACYclovir  1,000 mg Oral BID  . Warfarin - Pharmacist Dosing Inpatient   Does not apply q1800   Continuous Infusions: . sodium chloride 10 mL/hr at 01/02/14 2000  . milrinone 0.25 mcg/kg/min (01/03/14 2012)   PRN Meds:.dextromethorphan-guaiFENesin, LORazepam, ondansetron (ZOFRAN) IV, ondansetron, sodium chloride, sodium chloride, temazepam   Filed Vitals:   01/03/14 2000 01/03/14 2300 01/04/14 0341 01/04/14 0600  BP: 104/62 97/57  114/82   Pulse:  121 121   Temp: 98.1 F (36.7 C) 97.9 F (36.6 C) 97.4 F (36.3 C)   TempSrc: Oral Oral Oral   Resp: 20     Height:      Weight:    263 lb 0.1 oz (119.3 kg)  SpO2: 94% 94% 96%     Intake/Output Summary (Last 24 hours) at 01/04/14 0809 Last data filed at 01/04/14 0600  Gross per 24 hour  Intake 1283.6 ml  Output   1000 ml  Net  283.6 ml    LABS: Basic Metabolic Panel:  Recent Labs  01/03/14 0500 01/04/14 0400  NA 132* 130*  K 3.7 3.2*  CL 79* 78*  CO2 40* 39*  GLUCOSE 342* 148*  BUN 45* 57*  CREATININE 1.77* 2.14*  CALCIUM 9.5 9.5   Liver Function Tests:  Recent Labs  01/03/14 0500 01/04/14 0400  AST 162* 100*  ALT 291* 216*  ALKPHOS 137* 142*  BILITOT 3.0* 2.3*  PROT 7.8 7.6  ALBUMIN 3.3* 3.3*   No results found for this basename: LIPASE, AMYLASE,  in the last 72 hours CBC:  Recent Labs  01/03/14 0500 01/04/14 0400  WBC 10.5 14.5*  HGB 14.1 13.8  HCT 42.5 40.6  MCV 92.2 90.0  PLT 365 363   Cardiac Enzymes: No results found for this basename:  CKTOTAL, CKMB, CKMBINDEX, TROPONINI,  in the last 72 hours BNP: No components found with this basename: POCBNP,  D-Dimer: No results found for this basename: DDIMER,  in the last 72 hours Hemoglobin A1C: No results found for this basename: HGBA1C,  in the last 72 hours Fasting Lipid Panel: No results found for this basename: CHOL, HDL, LDLCALC, TRIG, CHOLHDL, LDLDIRECT,  in the last 72 hours Thyroid Function Tests: No results found for this basename: TSH, T4TOTAL, FREET3, T3FREE, THYROIDAB,  in the last 72 hours Anemia Panel: No results found for this basename: VITAMINB12, FOLATE, FERRITIN, TIBC, IRON, RETICCTPCT,  in the last 72 hours  RADIOLOGY: Dg Chest Port 1 View  12/28/2013   CLINICAL DATA:  Line placement.  Subsequent encounter.  EXAM: PORTABLE CHEST - 1 VIEW  COMPARISON:  Chest radiograph 01/15/2013.  FINDINGS: Cardiomegaly. LEFT upper extremity PICC is present with the tip  in the mid to lower SVC, approximately 1 vertebral body inferior to the carina. Lung volumes are low. Monitoring leads project over the chest. Tubing projects over the LEFT chest.  IMPRESSION: New LEFT upper extremity PICC with the tip in the mid to lower SVC.   Electronically Signed   By: Dereck Ligas M.D.   On: 12/28/2013 15:53    PHYSICAL EXAM CVP 8 General: NAD, lying in bed Neck: JVP difficult to assess d/t body habitus , no thyromegaly or thyroid nodule.  Lungs: Clear to auscultation bilaterally with normal respiratory effort. CV: Nondisplaced PMI.  Heart mildly tachy, irregular S1/S2, no S3/S4, no murmur.  1+ Woody edema.  Abdomen: nontender, no hepatosplenomegaly, mildly distended Neurologic: Alert and oriented x 3.  Psych: Normal affect. Extremities: No clubbing or cyanosis; R 2L PICC.  Skin: vesicular-appearing rash across back.    TELEMETRY: Reviewed telemetry pt in atrial fibrillation rate 120s   Assessment:    1. Volume overload  2. Acute on chronic systolic biventricular HF: Nonischemic cardiomyopathy - EF 15-20%. RV moderately HK 3. Chronic AF now with RVR  4. Acute liver failure - HIV/hepatitis panels negative  5. DM2  6. Morbid obesity  7. Acute on chronic renal failure, stage III 8. Hypokalemia/hyponatremia 9. Rash -   Plan/Discussion:    Stable overnight. Lasix gtt and metolazone stopped yesterday. Weight is up 4 lbs and renal fx up. Will continue to hold diuretics today. CVP 8. Co-ox 64% will wean milrinone to 0.125 mcg. Supplement K+   WBC slightly elevated today and has pending blood cx and urine cx. CXR shows bibasilar subsegmental atelectasis.   Remains in afib but HR more controlled 100-115s on Toprol 50 mg BID.   Patient very anxious to go home.   Continue to ambulate in the halls.   Rande Brunt, NP-C 8:09 AM  Patient seen and examined with Junie Bame, NP. We discussed all aspects of the encounter. I agree with the assessment and plan as  stated above.   He is making progress though he remains stir crazy. Continue to wean milrinone. Hold diuretics one more day. If renal function stabilizes can start back on home regimen. AF rate improving with weaning milrinone. Continue Toprol and amio. WBC up. BCx and UA drawn. No fevers. Will give incentive spirometer.   Hopefully we can get milrinone off tomorrow and potentially home Sunday or Monday unless other issues arise.   Quincey Quesinberry,MD 8:42 AM

## 2014-01-04 NOTE — Progress Notes (Signed)
Physical Therapy Treatment Patient Details Name: Panayiotis Rainville MRN: [id number] DOB: 12/10/1950 Today's Date: January 06, 2014    History of Present Illness pt presents with SOB, CHF, and A-fib.      PT Comments    Pt doing well with mobility and no further PT needed.  Ready for dc from PT standpoint.    Follow Up Recommendations  No PT follow up     Equipment Recommendations  None recommended by PT    Recommendations for Other Services       Precautions / Restrictions Precautions Precautions: None    Mobility  Bed Mobility               General bed mobility comments: Per pt independent.  Transfers Overall transfer level: Modified independent Equipment used: None Transfers: Sit to/from Stand Sit to Stand: Modified independent (Device/Increase time)            Ambulation/Gait Ambulation/Gait assistance: Modified independent (Device/Increase time) Ambulation Distance (Feet): 350 Feet Assistive device: None (IV pole at times) Gait Pattern/deviations: Wide base of support         Stairs            Wheelchair Mobility    Modified Rankin (Stroke Patients Only)       Balance     Sitting balance-Leahy Scale: Good       Standing balance-Leahy Scale: Good                      Cognition Arousal/Alertness: Awake/alert Behavior During Therapy: WFL for tasks assessed/performed Overall Cognitive Status: Within Functional Limits for tasks assessed                      Exercises      General Comments        Pertinent Vitals/Pain Pain Assessment: No/denies pain    Home Living                      Prior Function            PT Goals (current goals can now be found in the care plan section) Progress towards PT goals: Goals met/education completed, patient discharged from PT    Frequency       PT Plan Other (comment) (DC PT)    Co-evaluation             End of Session   Activity Tolerance:  Patient tolerated treatment well Patient left: in chair;with call bell/phone within reach     Time: 1430-1439 PT Time Calculation (min): 9 min  Charges:  $Gait Training: 8-22 mins                    G Codes:      Dariel Pellecchia 06-Jan-2014, 3:38 PM  [facility] PT [phone]

## 2014-01-05 LAB — CARBOXYHEMOGLOBIN
Carboxyhemoglobin: 1.1 % (ref 0.5–1.5)
Carboxyhemoglobin: 1.4 % (ref 0.5–1.5)
METHEMOGLOBIN: 0.9 % (ref 0.0–1.5)
Methemoglobin: 0.9 % (ref 0.0–1.5)
O2 Saturation: 47.9 %
O2 Saturation: 57.6 %
TOTAL HEMOGLOBIN: 14.4 g/dL (ref 13.5–18.0)
Total hemoglobin: 13.9 g/dL (ref 13.5–18.0)

## 2014-01-05 LAB — COMPREHENSIVE METABOLIC PANEL
ALBUMIN: 3.2 g/dL — AB (ref 3.5–5.2)
ALK PHOS: 137 U/L — AB (ref 39–117)
ALT: 161 U/L — AB (ref 0–53)
ANION GAP: 13 (ref 5–15)
AST: 61 U/L — AB (ref 0–37)
BILIRUBIN TOTAL: 2.1 mg/dL — AB (ref 0.3–1.2)
BUN: 51 mg/dL — AB (ref 6–23)
CHLORIDE: 88 meq/L — AB (ref 96–112)
CO2: 33 mEq/L — ABNORMAL HIGH (ref 19–32)
Calcium: 9.6 mg/dL (ref 8.4–10.5)
Creatinine, Ser: 1.59 mg/dL — ABNORMAL HIGH (ref 0.50–1.35)
GFR calc Af Amer: 52 mL/min — ABNORMAL LOW (ref >90)
GFR calc non Af Amer: 45 mL/min — ABNORMAL LOW (ref >90)
Glucose, Bld: 165 mg/dL — ABNORMAL HIGH (ref 70–99)
Potassium: 3.4 mEq/L — ABNORMAL LOW (ref 3.7–5.3)
SODIUM: 134 meq/L — AB (ref 137–147)
Total Protein: 7.8 g/dL (ref 6.0–8.3)

## 2014-01-05 LAB — CBC
HEMATOCRIT: 41.4 % (ref 39.0–52.0)
Hemoglobin: 13.7 g/dL (ref 13.0–17.0)
MCH: 30.4 pg (ref 26.0–34.0)
MCHC: 33.1 g/dL (ref 30.0–36.0)
MCV: 92 fL (ref 78.0–100.0)
Platelets: 368 10*3/uL (ref 150–400)
RBC: 4.5 MIL/uL (ref 4.22–5.81)
RDW: 15.8 % — AB (ref 11.5–15.5)
WBC: 11 10*3/uL — ABNORMAL HIGH (ref 4.0–10.5)

## 2014-01-05 LAB — PROTIME-INR
INR: 2.52 — ABNORMAL HIGH (ref 0.00–1.49)
Prothrombin Time: 27.4 seconds — ABNORMAL HIGH (ref 11.6–15.2)

## 2014-01-05 LAB — GLUCOSE, CAPILLARY
GLUCOSE-CAPILLARY: 122 mg/dL — AB (ref 70–99)
Glucose-Capillary: 211 mg/dL — ABNORMAL HIGH (ref 70–99)
Glucose-Capillary: 176 mg/dL — ABNORMAL HIGH (ref 70–99)
Glucose-Capillary: 189 mg/dL — ABNORMAL HIGH (ref 70–99)

## 2014-01-05 MED ORDER — WARFARIN SODIUM 5 MG PO TABS
5.0000 mg | ORAL_TABLET | Freq: Once | ORAL | Status: AC
  Administered 2014-01-05: 5 mg via ORAL
  Filled 2014-01-05: qty 1

## 2014-01-05 MED ORDER — POTASSIUM CHLORIDE CRYS ER 20 MEQ PO TBCR
40.0000 meq | EXTENDED_RELEASE_TABLET | Freq: Once | ORAL | Status: AC
  Administered 2014-01-05: 40 meq via ORAL
  Filled 2014-01-05: qty 2

## 2014-01-05 NOTE — Progress Notes (Signed)
Subjective: Raymond Pratt, sitting in Raymond chair currently, alert and oriented, clearly frustrated with length of duration of hospital stay greater than 2 weeks, continues on stepdown, continues on milrinone, cardiology seeing the patient at the same time, there are dictating care at this time given Asian significant cardiovascular issues. Patient continues to go somewhat stir crazy given length of time in the hospital. Current weight from this morning is unavailable, but denies any worsening respiratory distress, chest pain, or palpitations. Denies any presyncope.  Objective: Vital signs in last 24 hours: Temp:  [97.5 F (36.4 C)-98.2 F (36.8 C)] 98.2 F (36.8 C) (10/31 1150) Pulse Rate:  [98-117] 98 (10/31 0500) Resp:  [16-20] 20 (10/31 1150) BP: (97-129)/(61-77) 110/65 mmHg (10/31 1150) SpO2:  [94 %-97 %] 96 % (10/31 1150) Weight change:  Last BM Date: 01/04/14  CBG (last 3)   Recent Labs  01/04/14 1646 01/04/14 2120 01/05/14 0810  GLUCAP 230* 225* 122*    Intake/Output from previous day: 10/30 0701 - 10/31 0700 In: 1016.8 [P.O.:900; I.V.:116.8] Out: 1575 [Urine:1575] Intake/Output this shift: Total I/O In: 350 [P.O.:350] Out: 250 [Urine:250]  General appearance obese, cooperative, answering all questions appropriately No respiratory distress, lungs clear to auscultation bilaterally Cardiovascular reveals minimal tachycardia, with no appreciable murmurs Abdomen obese, but soft, nontender Extremities reveal no edema, overt cyanosis, skin thin and shiny   Lab Results:  Recent Labs  01/04/14 0400 01/05/14 0645  NA 130* 134*  K 3.2* 3.4*  CL 78* 88*  CO2 39* 33*  GLUCOSE 148* 165*  BUN 57* 51*  CREATININE 2.14* 1.59*  CALCIUM 9.5 9.6    Recent Labs  01/04/14 0400 01/05/14 0645  AST 100* 61*  ALT 216* 161*  ALKPHOS 142* 137*  BILITOT 2.3* 2.1*  PROT 7.6 7.8  ALBUMIN 3.3* 3.2*    Recent Labs  01/04/14 0400 01/05/14 0645  WBC 14.5* 11.0*  HGB  13.8 13.7  HCT 40.6 41.4  MCV 90.0 92.0  PLT 363 368   No results found for this basename: CKTOTAL, CKMB, CKMBINDEX, TROPONINI,  in the last 72 hours No results found for this basename: TSH, T4TOTAL, FREET3, T3FREE, THYROIDAB,  in the last 72 hours No results found for this basename: VITAMINB12, FOLATE, FERRITIN, TIBC, IRON, RETICCTPCT,  in the last 72 hours  Studies/Results: Dg Chest 2 View  01/04/2014   CLINICAL DATA:  Cough  EXAM: CHEST  2 VIEW  COMPARISON:  12/29/2013; 12/28/2013  FINDINGS: Mild rightward rotation. Prominence of the ascending thoracic aorta. Slight hilar prominence, likely vascular.  Mild cardiomegaly persists. Upper zone pulmonary vascular prominence. Suspected subsegmental atelectasis in both lung bases.  Left PICC line tip: SVC. Old left-sided healed rib fractures. Thoracic spondylosis noted.  No pleural effusion identified. Slight anterior wedging at T12, probably physiologic.  IMPRESSION: 1. Cardiomegaly with upper zone pulmonary vascular prominence compatible with pulmonary venous hypertension. 2. Bibasilar subsegmental atelectasis. 3. Prominent contour of the ascending thoracic aorta, at least partially due to rightward rotation. Raymond dilated ascending thoracic aorta is not excluded. 4. Mild bilateral hilar prominence is likely vascular.   Electronically Signed   By: Sherryl Barters M.D.   On: 01/04/2014 08:10     Medications: Scheduled: . amiodarone  400 mg Oral BID  . aspirin EC  81 mg Oral Daily  . digoxin  0.125 mg Oral Daily  . hydrocerin   Topical BID  . insulin aspart  0-15 Units Subcutaneous TID WC  . insulin glargine  15 Units Subcutaneous BID  .  linagliptin  5 mg Oral Daily  . metoprolol succinate  50 mg Oral BID  . potassium chloride  40 mEq Oral Once  . sodium chloride  10-40 mL Intracatheter Q12H  . sodium chloride  3 mL Intravenous Q12H  . spironolactone  12.5 mg Oral Daily  . warfarin  5 mg Oral ONCE-1800  . Warfarin - Pharmacist Dosing Inpatient    Does not apply q1800   Continuous: . sodium chloride 10 mL/hr at 01/02/14 2000  . milrinone 0.125 mcg/kg/min (01/05/14 1100)    Assessment/Plan: Active Problems:   Acute liver failure secondary to passive liver congestion right greater than left currently continues to depend on milrinone, care being dictated by radiology, with improving liver function transaminases.   Acute on chronic heart failure secondary to the above Leukocytosis, improving, chest x-ray without overt pneumonia Insomnia on Restoril Worsening renal function improving by holding diuretics yesterday, diuretics to be reinitiated per cardiology Anxiety using Ativan DM 2-continuing insulin and sliding scale\ Disposition per cardiology with plan of care and action    LOS: 8 days   Raymond Pratt 01/05/2014, 12:18 PM
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
## 2014-01-05 NOTE — Progress Notes (Addendum)
Patient ID: Raymond Pratt, male   DOB: 10/17/1950, 63 y.o.   MRN: [id number]   Raymond Pratt is a 63 y.o. morbidly obese male with h/o chronic combined systolic and diastolic CHF ([id number]) Echo: EF 20-25%, chronic AF (2010 s/p failed cardioversion), DM, HTN and OSA on CPAP.   Diuretics remain on hold.  Milrinone decreased to 0. 125 mcg yesterday. Co-ox 48% today. No weight on chart. Cr back to baseline. Walking halls without difficulty. WBC down. UA negative. CXR with atelectasis.  Denies dyspnea. CVP 13    Scheduled Meds: . amiodarone  400 mg Oral BID  . aspirin EC  81 mg Oral Daily  . digoxin  0.125 mg Oral Daily  . hydrocerin   Topical BID  . insulin aspart  0-15 Units Subcutaneous TID WC  . insulin glargine  15 Units Subcutaneous BID  . linagliptin  5 mg Oral Daily  . metoprolol succinate  50 mg Oral BID  . sodium chloride  10-40 mL Intracatheter Q12H  . sodium chloride  3 mL Intravenous Q12H  . spironolactone  12.5 mg Oral Daily  . Warfarin - Pharmacist Dosing Inpatient   Does not apply q1800   Continuous Infusions: . sodium chloride 10 mL/hr at 01/02/14 2000  . milrinone 0.125 mcg/kg/min (01/05/14 0122)   PRN Meds:.dextromethorphan-guaiFENesin, LORazepam, ondansetron (ZOFRAN) IV, ondansetron, sodium chloride, sodium chloride, temazepam   Filed Vitals:   01/04/14 2130 01/04/14 2330 01/05/14 0500 01/05/14 0812  BP:  97/61 97/61 97/77   Pulse: 106 117 98   Temp:  97.5 F (36.4 C) 97.9 F (36.6 C) 97.8 F (36.6 C)  TempSrc:  Oral Oral Oral  Resp:   16 18  Height:      Weight:      SpO2:  96% 97% 96%    Intake/Output Summary (Last 24 hours) at 01/05/14 1127 Last data filed at 01/05/14 1037  Gross per 24 hour  Intake 1233.6 ml  Output   1825 ml  Net -591.4 ml    LABS: Basic Metabolic Panel:  Recent Labs  01/04/14 0400 01/05/14 0645  NA 130* 134*  K 3.2* 3.4*  CL 78* 88*  CO2 39* 33*  GLUCOSE 148* 165*  BUN 57* 51*  CREATININE 2.14* 1.59*  CALCIUM  9.5 9.6   Liver Function Tests:  Recent Labs  01/04/14 0400 01/05/14 0645  AST 100* 61*  ALT 216* 161*  ALKPHOS 142* 137*  BILITOT 2.3* 2.1*  PROT 7.6 7.8  ALBUMIN 3.3* 3.2*   No results found for this basename: LIPASE, AMYLASE,  in the last 72 hours CBC:  Recent Labs  01/04/14 0400 01/05/14 0645  WBC 14.5* 11.0*  HGB 13.8 13.7  HCT 40.6 41.4  MCV 90.0 92.0  PLT 363 368   Cardiac Enzymes: No results found for this basename: CKTOTAL, CKMB, CKMBINDEX, TROPONINI,  in the last 72 hours BNP: No components found with this basename: POCBNP,  D-Dimer: No results found for this basename: DDIMER,  in the last 72 hours Hemoglobin A1C: No results found for this basename: HGBA1C,  in the last 72 hours Fasting Lipid Panel: No results found for this basename: CHOL, HDL, LDLCALC, TRIG, CHOLHDL, LDLDIRECT,  in the last 72 hours Thyroid Function Tests: No results found for this basename: TSH, T4TOTAL, FREET3, T3FREE, THYROIDAB,  in the last 72 hours Anemia Panel: No results found for this basename: VITAMINB12, FOLATE, FERRITIN, TIBC, IRON, RETICCTPCT,  in the last 72 hours  RADIOLOGY: Dg Chest Port 1 View  12/28/2013  CLINICAL DATA:  Line placement.  Subsequent encounter.  EXAM: PORTABLE CHEST - 1 VIEW  COMPARISON:  Chest radiograph 01/15/2013.  FINDINGS: Cardiomegaly. LEFT upper extremity PICC is present with the tip in the mid to lower SVC, approximately 1 vertebral body inferior to the carina. Lung volumes are low. Monitoring leads project over the chest. Tubing projects over the LEFT chest.  IMPRESSION: New LEFT upper extremity PICC with the tip in the mid to lower SVC.   Electronically Signed   By: Dereck Ligas M.D.   On: 12/28/2013 15:53    PHYSICAL EXAM CVP 13 General: NAD, sitting in chair Neck: JVP difficult to assess d/t body habitus , no thyromegaly or thyroid nodule.  Lungs: Clear to auscultation bilaterally with normal respiratory effort. CV: Nondisplaced PMI.   Heart mildly tachy, irregular S1/S2, no S3/S4, no murmur.  1+ Woody edema.  Abdomen: nontender, no hepatosplenomegaly, mildly distended Neurologic: Alert and oriented x 3.  Psych: Normal affect. Extremities: No clubbing or cyanosis; R 2L PICC.  Skin: vesicular-appearing rash across back.    TELEMETRY: Reviewed telemetry pt in atrial fibrillation rate 95-105. Up to 115 with ambulation   Assessment:    1. Volume overload  2. Acute on chronic systolic biventricular HF: Nonischemic cardiomyopathy - EF 15-20%. RV moderately HK 3. Chronic AF now with RVR  4. Acute liver failure - HIV/hepatitis panels negative  5. DM2  6. Morbid obesity  7. Acute on chronic renal failure, stage III 8. Hypokalemia/hyponatremia 9. Rash -   Plan/Discussion:    Stable overnight. Feeling better. Diuretics on hold. CVP back up to 13. Renal function now back to baseline.  Will resume home diuretics. Supp K+.    AF rate improved on amio and with milrinone wean.  Low co-ox concerning and I worry he may be nearing the point where he might be inotrope dependent. Will repeat co-ox now. Continue to wean as tolerated. Ideally would cut back Toprol but needs for rate control.   Continue to ambulate in the halls.   Raymond Bickers, MD 11:27 AM   Addendum: Repeat co-ox 57%. Will stop milrinone and recheck in am.  Raymond Pratt 1:42 PM

## 2014-01-06 LAB — GLUCOSE, CAPILLARY
GLUCOSE-CAPILLARY: 131 mg/dL — AB (ref 70–99)
GLUCOSE-CAPILLARY: 135 mg/dL — AB (ref 70–99)
GLUCOSE-CAPILLARY: 143 mg/dL — AB (ref 70–99)

## 2014-01-06 LAB — BASIC METABOLIC PANEL
Anion gap: 13 (ref 5–15)
BUN: 40 mg/dL — ABNORMAL HIGH (ref 6–23)
CO2: 28 mEq/L (ref 19–32)
Calcium: 8.4 mg/dL (ref 8.4–10.5)
Chloride: 93 mEq/L — ABNORMAL LOW (ref 96–112)
Creatinine, Ser: 1.16 mg/dL (ref 0.50–1.35)
GFR calc Af Amer: 76 mL/min — ABNORMAL LOW (ref >90)
GFR, EST NON AFRICAN AMERICAN: 66 mL/min — AB (ref >90)
Glucose, Bld: 322 mg/dL — ABNORMAL HIGH (ref 70–99)
POTASSIUM: 3.2 meq/L — AB (ref 3.7–5.3)
SODIUM: 134 meq/L — AB (ref 137–147)

## 2014-01-06 LAB — CARBOXYHEMOGLOBIN
Carboxyhemoglobin: 1 % (ref 0.5–1.5)
Methemoglobin: 0.9 % (ref 0.0–1.5)
O2 SAT: 41.6 %
TOTAL HEMOGLOBIN: 15.1 g/dL (ref 13.5–18.0)
TOTAL OXYGEN CONTENT: 1 mL/dL (ref 15.0–23.0)

## 2014-01-06 LAB — PROTIME-INR
INR: 3.06 — ABNORMAL HIGH (ref 0.00–1.49)
Prothrombin Time: 31.8 seconds — ABNORMAL HIGH (ref 11.6–15.2)

## 2014-01-06 MED ORDER — POTASSIUM CHLORIDE CRYS ER 20 MEQ PO TBCR
40.0000 meq | EXTENDED_RELEASE_TABLET | Freq: Four times a day (QID) | ORAL | Status: AC
  Administered 2014-01-06 (×2): 40 meq via ORAL
  Filled 2014-01-06 (×2): qty 2

## 2014-01-06 MED ORDER — AMIODARONE HCL 200 MG PO TABS
200.0000 mg | ORAL_TABLET | Freq: Two times a day (BID) | ORAL | Status: DC
  Administered 2014-01-06 – 2014-01-07 (×3): 200 mg via ORAL
  Filled 2014-01-06 (×4): qty 1

## 2014-01-06 MED ORDER — TORSEMIDE 20 MG PO TABS
60.0000 mg | ORAL_TABLET | Freq: Two times a day (BID) | ORAL | Status: DC
  Administered 2014-01-06 (×2): 60 mg via ORAL
  Filled 2014-01-06 (×4): qty 3

## 2014-01-06 MED ORDER — MILRINONE IN DEXTROSE 20 MG/100ML IV SOLN
0.1250 ug/kg/min | INTRAVENOUS | Status: DC
  Administered 2014-01-06 – 2014-01-07 (×2): 0.125 ug/kg/min via INTRAVENOUS
  Filled 2014-01-06 (×2): qty 100

## 2014-01-06 MED ORDER — WARFARIN SODIUM 1 MG PO TABS
1.0000 mg | ORAL_TABLET | Freq: Once | ORAL | Status: AC
  Administered 2014-01-06: 1 mg via ORAL
  Filled 2014-01-06: qty 1
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
## 2014-01-06 NOTE — Progress Notes (Signed)
Subjective: Still ambulating in the halls, sitting in a chair currently, alert and oriented, clearly frustrated with length of duration of hospital stay greater than 2 weeks, continues on stepdown, milrinone discontinued yesterday, however given current lab values reinitiated at a lower dose today, cardiology has seen the patient this AM, there are dictating care at this time given patient's significant cardiovascular issues. Patient continues to go somewhat stir crazy given length of time in the hospital esp at HS. Denies any worsening respiratory distress, chest pain, or palpitations. Denies any presyncope.  Objective: Vital signs in last 24 hours: Temp:  [97.8 F (36.6 C)-98.6 F (37 C)] 98.6 F (37 C) (11/01 0810) Pulse Rate:  [92] 92 (10/31 2122) Resp:  [18-21] 21 (11/01 0400) BP: (91-110)/(60-76) 97/73 mmHg (11/01 0810) SpO2:  [96 %-98 %] 98 % (11/01 0810) Weight:  [120.3 kg (265 lb 3.4 oz)] 120.3 kg (265 lb 3.4 oz) (11/01 0500) Weight change:  Last BM Date: 01/04/14  CBG (last 3)   Recent Labs  01/05/14 1657 01/05/14 2106 01/06/14 0812  GLUCAP 176* 189* 131*    Intake/Output from previous day: 10/31 0701 - 11/01 0700 In: 1010 [P.O.:1010] Out: 1625 [Urine:1625] Intake/Output this shift:    General appearance obese, cooperative, answering all questions appropriately No respiratory distress, lungs clear to auscultation bilaterally Cardiovascular reveals minimal tachycardia, with no appreciable murmurs, irregular Abdomen obese, but soft, nontender Extremities reveal tr edema, no overt cyanosis, skin thin and shiny   Lab Results:  Recent Labs  01/04/14 0400 01/05/14 0645  NA 130* 134*  K 3.2* 3.4*  CL 78* 88*  CO2 39* 33*  GLUCOSE 148* 165*  BUN 57* 51*  CREATININE 2.14* 1.59*  CALCIUM 9.5 9.6    Recent Labs  01/04/14 0400 01/05/14 0645  AST 100* 61*  ALT 216* 161*  ALKPHOS 142* 137*  BILITOT 2.3* 2.1*  PROT 7.6 7.8  ALBUMIN 3.3* 3.2*    Recent  Labs  01/04/14 0400 01/05/14 0645  WBC 14.5* 11.0*  HGB 13.8 13.7  HCT 40.6 41.4  MCV 90.0 92.0  PLT 363 368   No results for input(s): CKTOTAL, CKMB, CKMBINDEX, TROPONINI in the last 72 hours. No results for input(s): TSH, T4TOTAL, T3FREE, THYROIDAB in the last 72 hours.  Invalid input(s): FREET3 No results for input(s): VITAMINB12, FOLATE, FERRITIN, TIBC, IRON, RETICCTPCT in the last 72 hours.  Studies/Results: No results found.   Medications: Scheduled: . amiodarone  200 mg Oral BID  . aspirin EC  81 mg Oral Daily  . digoxin  0.125 mg Oral Daily  . hydrocerin   Topical BID  . insulin aspart  0-15 Units Subcutaneous TID WC  . insulin glargine  15 Units Subcutaneous BID  . linagliptin  5 mg Oral Daily  . metoprolol succinate  50 mg Oral BID  . sodium chloride  10-40 mL Intracatheter Q12H  . sodium chloride  3 mL Intravenous Q12H  . spironolactone  12.5 mg Oral Daily  . torsemide  60 mg Oral BID  . Warfarin - Pharmacist Dosing Inpatient   Does not apply q1800   Continuous: . sodium chloride 10 mL/hr at 01/02/14 2000  . milrinone      Assessment/Plan: Active Problems:   Acute liver failure secondary to passive liver congestion right greater than left currently continues to depend on milrinonerestarted at a lower dose this morning by cardiology, care being dictated by cardiology, with improving liver function transaminases.   Acute on chronic heart failure secondary to the  above Leukocytosis, improving, chest x-ray without overt pneumonia Insomnia on Restoril Worsening renal function improving by holding diuretics yesterday, diuretics to be reinitiated per cardiology-labs pending for this morning Anxiety using Ativan DM 2-continuing insulin and sliding scale-stable Disposition per cardiology with plan of care and action    LOS: 9 days   Kameren Pargas R 01/06/2014, 9:16 AM

## 2014-01-06 NOTE — Progress Notes (Signed)
Patient ID: Raymond Pratt, male   DOB: [birth year], 62 y.o.   MRN: [id number]   Raymond Pratt is a 63 y.o. morbidly obese male with h/o chronic combined systolic and diastolic CHF ([id number]) Echo: EF 20-25%, chronic AF (2010 s/p failed cardioversion), DM, HTN and OSA on CPAP.   Milrinone off yesterday but co-ox down to 42% today.  CVP 5-7.  Feels ok, wants to go home. Remains in atrial fibrillation with HR in 90s.  Scheduled Meds: . amiodarone  200 mg Oral BID  . aspirin EC  81 mg Oral Daily  . digoxin  0.125 mg Oral Daily  . hydrocerin   Topical BID  . insulin aspart  0-15 Units Subcutaneous TID WC  . insulin glargine  15 Units Subcutaneous BID  . linagliptin  5 mg Oral Daily  . metoprolol succinate  50 mg Oral BID  . sodium chloride  10-40 mL Intracatheter Q12H  . sodium chloride  3 mL Intravenous Q12H  . spironolactone  12.5 mg Oral Daily  . torsemide  60 mg Oral BID  . Warfarin - Pharmacist Dosing Inpatient   Does not apply q1800   Continuous Infusions: . sodium chloride 10 mL/hr at 01/02/14 2000  . milrinone     PRN Meds:.dextromethorphan-guaiFENesin, LORazepam, ondansetron **OR** ondansetron (ZOFRAN) IV, sodium chloride, sodium chloride, temazepam   Filed Vitals:   01/05/14 2122 01/05/14 2300 01/06/14 0400 01/06/14 0500  BP: 101/72 96/76 93/72    Pulse: 92     Temp:  97.8 F (36.6 C) 98.6 F (37 C)   TempSrc:  Oral Oral   Resp:  19 21   Height:      Weight:    265 lb 3.4 oz (120.3 kg)  SpO2:  96% 97%     Intake/Output Summary (Last 24 hours) at 01/06/14 0811 Last data filed at 01/06/14 0400  Gross per 24 hour  Intake   1010 ml  Output   1625 ml  Net   -615 ml    LABS: Basic Metabolic Panel:  Recent Labs  01/04/14 0400 01/05/14 0645  NA 130* 134*  K 3.2* 3.4*  CL 78* 88*  CO2 39* 33*  GLUCOSE 148* 165*  BUN 57* 51*  CREATININE 2.14* 1.59*  CALCIUM 9.5 9.6   Liver Function Tests:  Recent Labs  01/04/14 0400 01/05/14 0645  AST 100* 61*    ALT 216* 161*  ALKPHOS 142* 137*  BILITOT 2.3* 2.1*  PROT 7.6 7.8  ALBUMIN 3.3* 3.2*   No results for input(s): LIPASE, AMYLASE in the last 72 hours. CBC:  Recent Labs  01/04/14 0400 01/05/14 0645  WBC 14.5* 11.0*  HGB 13.8 13.7  HCT 40.6 41.4  MCV 90.0 92.0  PLT 363 368   Cardiac Enzymes: No results for input(s): CKTOTAL, CKMB, CKMBINDEX, TROPONINI in the last 72 hours. BNP: Invalid input(s): POCBNP D-Dimer: No results for input(s): DDIMER in the last 72 hours. Hemoglobin A1C: No results for input(s): HGBA1C in the last 72 hours. Fasting Lipid Panel: No results for input(s): CHOL, HDL, LDLCALC, TRIG, CHOLHDL, LDLDIRECT in the last 72 hours. Thyroid Function Tests: No results for input(s): TSH, T4TOTAL, T3FREE, THYROIDAB in the last 72 hours.  Invalid input(s): FREET3 Anemia Panel: No results for input(s): VITAMINB12, FOLATE, FERRITIN, TIBC, IRON, RETICCTPCT in the last 72 hours.  RADIOLOGY: Dg Chest Port 1 View  12/28/2013   CLINICAL DATA:  Line placement.  Subsequent encounter.  EXAM: PORTABLE CHEST - 1 VIEW  COMPARISON:  Chest radiograph 01/15/2013.  FINDINGS: Cardiomegaly. LEFT upper extremity PICC is present with the tip in the mid to lower SVC, approximately 1 vertebral body inferior to the carina. Lung volumes are low. Monitoring leads project over the chest. Tubing projects over the LEFT chest.  IMPRESSION: New LEFT upper extremity PICC with the tip in the mid to lower SVC.   Electronically Signed   By: Raymond Pratt M.D.   On: 12/28/2013 15:53    PHYSICAL EXAM CVP 5-7 General: NAD, sitting in chair Neck: JVP 12-13 cm, no thyromegaly or thyroid nodule.  Lungs: Clear to auscultation bilaterally with normal respiratory effort. CV: Nondisplaced PMI.  Heart mildly tachy, irregular S1/S2, no S3/S4, no murmur.  1+ Woody edema.  Abdomen: nontender, no hepatosplenomegaly, mildly distended Neurologic: Alert and oriented x 3.  Psych: Normal affect. Extremities: No  clubbing or cyanosis; R 2L PICC.  Skin: vesicular-appearing rash across back.    TELEMETRY: Reviewed telemetry pt in atrial fibrillation rate 90s   Assessment:    1. Volume overload  2. Acute on chronic systolic biventricular HF: Nonischemic cardiomyopathy - EF 15-20%. RV moderately HK 3. Chronic AF now with RVR  4. Acute liver failure - HIV/hepatitis panels negative  5. DM2  6. Morbid obesity  7. Acute on chronic renal failure, stage III 8. Hypokalemia/hyponatremia 9. Rash -   Plan/Discussion:    Stable overnight but co-ox low again this morning at 42% off milrinone.  I think that he is going to be inotrope-dependent.  I will restart milrinone at lowest dose 0.125 to try to limit effect on heart rate.  Will repeat co-ox in am.  I think he will need to go home on milrinone.  CVP not measuring particularly high but JVP up.  I think that we can begin his home torsemide, 60 mg bid.  No BMET yet today but creatinine yesterday back to 1.5.   AF rate stable.  Will follow with re-initiation of low dose milrinone.  He is on digoxin and Toprol XL.  Will try to cut back on amiodarone (hopefully eventually stop) as he is not going to be a good cardioversion candidate (probably permanent atrial fibrillation).   Continue to ambulate in the halls.   [name] 01/06/2014

## 2014-01-07 DIAGNOSIS — I5023 Acute on chronic systolic (congestive) heart failure: Secondary | ICD-10-CM

## 2014-01-07 LAB — COMPREHENSIVE METABOLIC PANEL
ALT: 100 U/L — ABNORMAL HIGH (ref 0–53)
AST: 43 U/L — ABNORMAL HIGH (ref 0–37)
Albumin: 2.9 g/dL — ABNORMAL LOW (ref 3.5–5.2)
Alkaline Phosphatase: 114 U/L (ref 39–117)
Anion gap: 13 (ref 5–15)
BUN: 44 mg/dL — ABNORMAL HIGH (ref 6–23)
CHLORIDE: 96 meq/L (ref 96–112)
CO2: 32 meq/L (ref 19–32)
Calcium: 9.1 mg/dL (ref 8.4–10.5)
Creatinine, Ser: 1.46 mg/dL — ABNORMAL HIGH (ref 0.50–1.35)
GFR, EST AFRICAN AMERICAN: 58 mL/min — AB (ref >90)
GFR, EST NON AFRICAN AMERICAN: 50 mL/min — AB (ref >90)
GLUCOSE: 153 mg/dL — AB (ref 70–99)
Potassium: 3.5 mEq/L — ABNORMAL LOW (ref 3.7–5.3)
SODIUM: 141 meq/L (ref 137–147)
Total Bilirubin: 1.8 mg/dL — ABNORMAL HIGH (ref 0.3–1.2)
Total Protein: 7.2 g/dL (ref 6.0–8.3)

## 2014-01-07 LAB — CBC
HEMATOCRIT: 44.9 % (ref 39.0–52.0)
HEMOGLOBIN: 14.5 g/dL (ref 13.0–17.0)
MCH: 30 pg (ref 26.0–34.0)
MCHC: 32.3 g/dL (ref 30.0–36.0)
MCV: 93 fL (ref 78.0–100.0)
Platelets: 384 10*3/uL (ref 150–400)
RBC: 4.83 MIL/uL (ref 4.22–5.81)
RDW: 16.2 % — ABNORMAL HIGH (ref 11.5–15.5)
WBC: 8.8 10*3/uL (ref 4.0–10.5)

## 2014-01-07 LAB — GLUCOSE, CAPILLARY
GLUCOSE-CAPILLARY: 200 mg/dL — AB (ref 70–99)
Glucose-Capillary: 119 mg/dL — ABNORMAL HIGH (ref 70–99)

## 2014-01-07 LAB — CARBOXYHEMOGLOBIN
Carboxyhemoglobin: 1.2 % (ref 0.5–1.5)
Methemoglobin: 0.8 % (ref 0.0–1.5)
O2 Saturation: 59.1 %
Total hemoglobin: 15.1 g/dL (ref 13.5–18.0)

## 2014-01-07 LAB — PROTIME-INR
INR: 2.68 — AB (ref 0.00–1.49)
Prothrombin Time: 28.7 seconds — ABNORMAL HIGH (ref 11.6–15.2)

## 2014-01-07 LAB — DIGOXIN LEVEL: Digoxin Level: <0.3 ng/mL — ABNORMAL LOW (ref 0.8–2.0)

## 2014-01-07 MED ORDER — WARFARIN SODIUM 5 MG PO TABS
5.0000 mg | ORAL_TABLET | Freq: Once | ORAL | Status: DC
  Filled 2014-01-07: qty 1

## 2014-01-07 MED ORDER — MILRINONE IN DEXTROSE 20 MG/100ML IV SOLN: 0.1250 ug/kg/min | INTRAVENOUS | Status: DC

## 2014-01-07 MED ORDER — AMIODARONE HCL 200 MG PO TABS: 200.0000 mg | ORAL_TABLET | Freq: Two times a day (BID) | ORAL | Status: DC

## 2014-01-07 MED ORDER — TORSEMIDE 20 MG PO TABS
60.0000 mg | ORAL_TABLET | Freq: Two times a day (BID) | ORAL | Status: DC
  Administered 2014-01-07: 60 mg via ORAL
  Filled 2014-01-07 (×2): qty 3

## 2014-01-07 MED ORDER — TEMAZEPAM 15 MG PO CAPS: 15.0000 mg | ORAL_CAPSULE | Freq: Every evening | ORAL | Status: DC | PRN

## 2014-01-07 MED ORDER — DIGOXIN 250 MCG PO TABS: 0.1250 mg | ORAL_TABLET | Freq: Every day | ORAL | Status: DC

## 2014-01-07 MED ORDER — WARFARIN SODIUM 5 MG PO TABS: ORAL_TABLET | ORAL | Status: DC

## 2014-01-07 MED ORDER — TORSEMIDE 20 MG PO TABS: 60.0000 mg | ORAL_TABLET | Freq: Two times a day (BID) | ORAL | Status: DC

## 2014-01-07 MED ORDER — METOPROLOL SUCCINATE ER 50 MG PO TB24: 50.0000 mg | ORAL_TABLET | Freq: Two times a day (BID) | ORAL | Status: DC

## 2014-01-07 MED ORDER — POTASSIUM CHLORIDE CRYS ER 20 MEQ PO TBCR
40.0000 meq | EXTENDED_RELEASE_TABLET | Freq: Once | ORAL | Status: AC
  Administered 2014-01-07: 40 meq via ORAL
  Filled 2014-01-07: qty 4

## 2014-01-07 MED ORDER — SPIRONOLACTONE 25 MG PO TABS
25.0000 mg | ORAL_TABLET | Freq: Every day | ORAL | Status: DC
  Administered 2014-01-07: 25 mg via ORAL
  Filled 2014-01-07: qty 1

## 2014-01-07 NOTE — Progress Notes (Signed)
Subjective: frustrated with length of duration of hospital stay Looks to be milrinone dependent.  Denies any worsening respiratory distress, chest pain, or palpitations. Denies any presyncope. No cough. Looks well this am. Questions answered.  Objective: Vital signs in last 24 hours: Temp:  [97.3 F (36.3 C)-98.1 F (36.7 C)] 98 F (36.7 C) (11/02 0739) Pulse Rate:  [90-96] 90 (11/02 0739) Resp:  [15-20] 15 (11/02 0427) BP: (81-125)/(52-89) 121/68 mmHg (11/02 0739) SpO2:  [96 %-99 %] 98 % (11/02 0739) Weight:  [118.3 kg (260 lb 12.9 oz)] 118.3 kg (260 lb 12.9 oz) (11/02 0427) Weight change: -2 kg (-4 lb 6.5 oz) Last BM Date: 01/06/14  CBG (last 3)   Recent Labs  01/06/14 0812 01/06/14 1147 01/06/14 2138  GLUCAP 131* 135* 143*    Intake/Output from previous day: 11/01 0701 - 11/02 0700 In: 1825 [P.O.:1580; I.V.:245] Out: 5250 [Urine:5250] Intake/Output this shift:    General appearance obese, cooperative, answering all questions appropriately No respiratory distress, lungs clear to auscultation bilaterally Cardiovascular reveals minimal tachycardia, with no appreciable murmurs, irregular.  Some PVCs on monitor Abdomen obese, but soft, nontender Extremities reveal tr edema, no overt cyanosis, skin thin and shiny   Lab Results:  Recent Labs  01/06/14 0925 01/07/14 0440  NA 134* 141  K 3.2* 3.5*  CL 93* 96  CO2 28 32  GLUCOSE 322* 153*  BUN 40* 44*  CREATININE 1.16 1.46*  CALCIUM 8.4 9.1    Recent Labs  01/05/14 0645 01/07/14 0440  AST 61* 43*  ALT 161* 100*  ALKPHOS 137* 114  BILITOT 2.1* 1.8*  PROT 7.8 7.2  ALBUMIN 3.2* 2.9*    Recent Labs  01/05/14 0645 01/07/14 0440  WBC 11.0* 8.8  HGB 13.7 14.5  HCT 41.4 44.9  MCV 92.0 93.0  PLT 368 384   No results for input(s): CKTOTAL, CKMB, CKMBINDEX, TROPONINI in the last 72 hours. No results for input(s): TSH, T4TOTAL, T3FREE, THYROIDAB in the last 72 hours.  Invalid input(s): FREET3 No  results for input(s): VITAMINB12, FOLATE, FERRITIN, TIBC, IRON, RETICCTPCT in the last 72 hours.  Studies/Results: No results found.   Medications: Scheduled: . amiodarone  200 mg Oral BID  . aspirin EC  81 mg Oral Daily  . digoxin  0.125 mg Oral Daily  . hydrocerin   Topical BID  . insulin aspart  0-15 Units Subcutaneous TID WC  . insulin glargine  15 Units Subcutaneous BID  . linagliptin  5 mg Oral Daily  . metoprolol succinate  50 mg Oral BID  . potassium chloride  40 mEq Oral Once  . sodium chloride  10-40 mL Intracatheter Q12H  . sodium chloride  3 mL Intravenous Q12H  . spironolactone  25 mg Oral Daily  . torsemide  60 mg Oral BID  . warfarin  5 mg Oral ONCE-1800  . Warfarin - Pharmacist Dosing Inpatient   Does not apply q1800   Continuous: . sodium chloride 10 mL (01/06/14 2000)  . milrinone 0.125 mcg/kg/min (01/07/14 0223)    Assessment/Plan:   Admitted c Shock Liver d/t R CHF. May have been brought on by Viral Vesicular rash and AFib RVR. He looks better than admit AST and ALT down to 43 and 100 respectively Wt 285# on admit in my office (higher in house) - 260 on 11/2. ie 26-30# wt loss S/p Brisk Diuresis.  BPs 80-120 Remains HR fine to Mild Tachycardic - tolerating this. Has not slept much during hospital stay.  Acute liver  failure- due to passive liver congestion due to R > L acute on chronic chf. AST and ALT improved nicely from 700-900's down to 40-100 respectively. INR therapeutic. CT showed no bile obstruction or other obvious cause of abnl LFT. Continue current meds.  His CHF continues to depend on milrinone. Improved liver function transaminases.  NICCM - Acute on chronic heart failure c sig Vol Overload- per cardiology. He had been improving and doing better much better - Lasix gtt and metalozone stopped. Milrinone decreased and weaned off.  Parameters showed this medicine was needed. Weight down near baseline.  EF 15-20% on 12/29/13 by ECHO.Cr  shot up to 2.14 and back down to baseline currently to 1.46. UF was not needed. Amy Clegg's Note (NP stated this am: CO-OX 41>59% improved with Milrinone 0.125 mcg . Will set up for home Milrinone. She contacted [facility] for home milrionone. Creatinine yesterday likely an outlier. Overall he diuresed 29 pounds. Ok to d/c home today after home milrinone set up.   D/C Home Meds Milrinone 0.125 mcg Torsemide 60 mg twice a day Toprol XL 50 mg twice a day Amiodarone 200 mg twice a day Digoxin 0.125 mg daily Spironolactone 25 mg daily  Kdur 20 meq twice aday.   AF rate stable. He is on digoxin and Toprol XL. Will try to cut back on amiodarone (hopefully eventually stop) as he is not going to be a good cardioversion candidate (probably permanent atrial fibrillation).   Leukocytosis, improved to 8.8.  W/Up was negative.  No Fever.  Never needed Abx. chest x-ray without overt pneumonia  Insomnia on Restoril.  Rx for D/c wtritten  AKI - resolved.  Anxiety using Ativan- can use Xanax prn at home DM2-continuing insulin and sliding scale-stable. On D/c resume home meds. Continue Tradjenta. CBGs fineLactic Acidosis from Hypoperfusion d/t low CO CHF which was source of his ab pain in my office Ammonia was only 10  Afib RVR - Amio/toprol/Dig. Cards holding on Ablation BiV Pacer. INR goal 2-3 Zoster Ruled out- IgM Abs (-). Finish valtrex after 7 days. Hepatitis profile (-), (-) HIV Ab, Viral cx (-), Swab (-) Off isolation and skin improved. His skin lesions were probably excoriations from Elevated T Bili c pruritis. Current T Bili 1.8 Protein Cal Malnutrition - push diet. Hypokalemia - Needed Repletion.  3.5 today 4 mm nonobstructive calculus in the lower pole collecting system of the right kidney - NTD 10 x 8 mm left lower lobe pulmonary nodule. A short-term follow-up chest CT is recommended in 3 months @ Apr 08, 2014 to ensure the stability or resolution of this finding, as neoplasm is not  excluded. Coronary Atherosclerosis - seen on CT. RF modify as able. Goal INR 2-3.  Home warfarin dose: 7.5mg  MWFSat and 5mg  TTSun   11/2 INR 2.68.  11/1 3.06.   10/31 2.52.  10/30 2.42.  10/29 2.13.  10.28 1.91    Will D/c on Warfarin 7.5mg  WFSat and 5mg  MTTSun OSA - CPAP  Will work with cards on getting him D/ced today. I did his Rxs and did a D/C MAR - Cards please look it over for accuracy. Will let Cards arrange the IV Milrinone at home as I do not know how. PICC CDI. No more cough. He is ready for D/c.    LOS: 10 days   Shaniece Bussa M 01/07/2014, 8:12 AM

## 2014-01-07 NOTE — Progress Notes (Addendum)
CARE MANAGEMENT NOTE 01/07/2014  Patient:  KELVON, DENKINS   Account Number:  [id number]  Date Initiated:  01/02/2014  Documentation initiated by:  Elissa Hefty  Subjective/Objective Assessment:   adm w liver failure     Action/Plan:   lives alone, pcp dr Virgina Jock   Anticipated DC Date:  01/07/2014   Anticipated DC Plan:  [place]  CM consult      [facility] Choice  HOME HEALTH   Choice offered to / List presented to:  C-1 Patient        [place] arranged  HH-1 RN  [place].   Status of service:  Completed, signed off Medicare Important Message given?   (If response is "NO", the following Medicare IM given date fields will be blank) Date Medicare IM given:   Medicare IM given by:   Date Additional Medicare IM given:   Additional Medicare IM given by:    Discharge Disposition:  [place]  Per UR Regulation:  Reviewed for med. necessity/level of care/duration of stay  If discussed at [place] of Stay Meetings, dates discussed:   01/03/2014    Comments:   01/07/2014 10:45 AM NCM spoke to pt and dc home on IV Milrinone. [facility] aware of dc home today on medication. Pt states he lives at home alone but has dtrs, Franklyn Lor  that can assist with care. Jonnie Finner RN CCM Case Mgmt phone [phone]

## 2014-01-07 NOTE — Progress Notes (Signed)
Patient ID: Raymond Pratt, male   DOB: Dec 15, 1950, 63 y.o.   MRN: [id number]   Raymond Pratt is a 63 y.o. morbidly obese male with h/o chronic combined systolic and diastolic CHF ([id number]) Echo: EF 20-25%, chronic AF (2010 s/p failed cardioversion), DM, HTN and OSA on CPAP.   Yesterday Milrinone restarted at 0.125 mcg for CO-OX 42%. Today CO-OX is up 59%.   Wants to go home. Denies SOB.    Scheduled Meds: . amiodarone  200 mg Oral BID  . aspirin EC  81 mg Oral Daily  . digoxin  0.125 mg Oral Daily  . hydrocerin   Topical BID  . insulin aspart  0-15 Units Subcutaneous TID WC  . insulin glargine  15 Units Subcutaneous BID  . linagliptin  5 mg Oral Daily  . metoprolol succinate  50 mg Oral BID  . sodium chloride  10-40 mL Intracatheter Q12H  . sodium chloride  3 mL Intravenous Q12H  . spironolactone  12.5 mg Oral Daily  . torsemide  60 mg Oral BID  . Warfarin - Pharmacist Dosing Inpatient   Does not apply q1800   Continuous Infusions: . sodium chloride 10 mL (01/06/14 2000)  . milrinone 0.125 mcg/kg/min (01/07/14 0223)   PRN Meds:.dextromethorphan-guaiFENesin, LORazepam, ondansetron **OR** ondansetron (ZOFRAN) IV, sodium chloride, sodium chloride, temazepam   Filed Vitals:   01/06/14 2147 01/06/14 2358 01/07/14 0010 01/07/14 0427  BP: 94/54 81/52 90/58  125/89  Pulse: 96     Temp:  98.1 F (36.7 C)  97.7 F (36.5 C)  TempSrc:  Oral  Oral  Resp:  16  15  Height:      Weight:    260 lb 12.9 oz (118.3 kg)  SpO2:  97%  97%    Intake/Output Summary (Last 24 hours) at 01/07/14 0747 Last data filed at 01/07/14 0600  Gross per 24 hour  Intake 1825.01 ml  Output   4975 ml  Net -3149.99 ml    LABS: Basic Metabolic Panel:  Recent Labs  01/06/14 0925 01/07/14 0440  NA 134* 141  K 3.2* 3.5*  CL 93* 96  CO2 28 32  GLUCOSE 322* 153*  BUN 40* 44*  CREATININE 1.16 1.46*  CALCIUM 8.4 9.1   Liver Function Tests:  Recent Labs  01/05/14 0645 01/07/14 0440  AST  61* 43*  ALT 161* 100*  ALKPHOS 137* 114  BILITOT 2.1* 1.8*  PROT 7.8 7.2  ALBUMIN 3.2* 2.9*   No results for input(s): LIPASE, AMYLASE in the last 72 hours. CBC:  Recent Labs  01/05/14 0645 01/07/14 0440  WBC 11.0* 8.8  HGB 13.7 14.5  HCT 41.4 44.9  MCV 92.0 93.0  PLT 368 384   Cardiac Enzymes: No results for input(s): CKTOTAL, CKMB, CKMBINDEX, TROPONINI in the last 72 hours. BNP: Invalid input(s): POCBNP D-Dimer: No results for input(s): DDIMER in the last 72 hours. Hemoglobin A1C: No results for input(s): HGBA1C in the last 72 hours. Fasting Lipid Panel: No results for input(s): CHOL, HDL, LDLCALC, TRIG, CHOLHDL, LDLDIRECT in the last 72 hours. Thyroid Function Tests: No results for input(s): TSH, T4TOTAL, T3FREE, THYROIDAB in the last 72 hours.  Invalid input(s): FREET3 Anemia Panel: No results for input(s): VITAMINB12, FOLATE, FERRITIN, TIBC, IRON, RETICCTPCT in the last 72 hours.  RADIOLOGY: Dg Chest Port 1 View  12/28/2013   CLINICAL DATA:  Line placement.  Subsequent encounter.  EXAM: PORTABLE CHEST - 1 VIEW  COMPARISON:  Chest radiograph 01/15/2013.  FINDINGS: Cardiomegaly. LEFT upper extremity PICC  is present with the tip in the mid to lower SVC, approximately 1 vertebral body inferior to the carina. Lung volumes are low. Monitoring leads project over the chest. Tubing projects over the LEFT chest.  IMPRESSION: New LEFT upper extremity PICC with the tip in the mid to lower SVC.   Electronically Signed   By: Dereck Ligas M.D.   On: 12/28/2013 15:53    PHYSICAL EXAM General: NAD, sitting in chair Neck: JVP 7-8 cm, no thyromegaly or thyroid nodule.  Lungs: Clear to auscultation bilaterally with normal respiratory effort. CV: Nondisplaced PMI.  Heart , irregular S1/S2, no S3/S4, no murmur.  1+ Woody edema.  Abdomen: nontender, no hepatosplenomegaly, mildly distended Neurologic: Alert and oriented x 3.  Psych: Normal affect. Extremities: No clubbing or  cyanosis; R 2L PICC.  Skin: vesicular-appearing rash across back.    TELEMETRY: Reviewed telemetry pt in atrial fibrillation rate 90s   Assessment:    1. Volume overload  2. Acute on chronic systolic biventricular HF: Nonischemic cardiomyopathy - EF 15-20%. RV moderately HK 3. Chronic AF now with RVR  4. Acute liver failure - HIV/hepatitis panels negative  5. DM2  6. Morbid obesity  7. Acute on chronic renal failure, stage III 8. Hypokalemia/hyponatremia 9. Rash -   Plan/Discussion:    CO-OX 41>59% improved with Milrinone 0.125 mcg  . Will set up for home Milrinone. I have contacted [facility] for home milrionone. Creatinine yesterday likely an outlier. Overall he diuresed 29 pounds. Ok to d/c home today after home milrinone set up.   AF rate stable.  Will follow with re-initiation of low dose milrinone.  He is on digoxin and Toprol XL.  Will try to cut back on amiodarone (hopefully eventually stop) as he is not going to be a good cardioversion candidate (probably permanent atrial fibrillation).   D/C Home Meds Milrinone 0.125 mcg Torsemide 60 mg twice a day Toprol XL 50 mg twice a day Amiodarone 200 mg twice a day Digoxin 0.125 mg daily Spironolactone 25 mg daily  Kdur 20 meq twice aday.    Has follow up in HF clinic 01/14/14 at 9:45.  CLEGG,AMY NP-C  01/07/2014  Patient seen and examined with Darrick Grinder, NP. We discussed all aspects of the encounter. I agree with the assessment and plan as stated above.   He looks much better. He is clearly inotropic dependent. Will send home on milrinone. Agree with above med list. Needs f/u next week in HF clinic. Hopefully we can wean amiodarone as outpatient. He has refused ICD in past. Will need to readdress as outpatient though as he is now Class IV may not be indicated.  Daniel Bensimhon,MD 9:57 AM
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
## 2014-01-07 NOTE — Progress Notes (Signed)
RT note:  Pt places self on/off cpap/  Rt wil continue to monitor.

## 2014-01-10 LAB — CULTURE, BLOOD (ROUTINE X 2)
Culture: NO GROWTH
Culture: NO GROWTH

## 2014-01-14 ENCOUNTER — Encounter (HOSPITAL_COMMUNITY): Payer: Self-pay

## 2014-01-14 ENCOUNTER — Ambulatory Visit (HOSPITAL_COMMUNITY)
Admit: 2014-01-14 | Discharge: 2014-01-14 | Disposition: A | Discharge status: Home / Self Care | Payer: BC Managed Care – PPO | Source: Ambulatory Visit | Attending: Internal Medicine | Admitting: Internal Medicine

## 2014-01-14 VITALS — BP 135/83 | HR 97 | Resp 18 | Wt 284.4 lb

## 2014-01-14 DIAGNOSIS — I480 Paroxysmal atrial fibrillation: Secondary | ICD-10-CM

## 2014-01-14 DIAGNOSIS — E1165 Type 2 diabetes mellitus with hyperglycemia: Secondary | ICD-10-CM | POA: Insufficient documentation

## 2014-01-14 DIAGNOSIS — Z7901 Long term (current) use of anticoagulants: Secondary | ICD-10-CM | POA: Diagnosis not present

## 2014-01-14 DIAGNOSIS — I482 Chronic atrial fibrillation: Secondary | ICD-10-CM | POA: Diagnosis not present

## 2014-01-14 DIAGNOSIS — I429 Cardiomyopathy, unspecified: Secondary | ICD-10-CM | POA: Insufficient documentation

## 2014-01-14 DIAGNOSIS — E669 Obesity, unspecified: Secondary | ICD-10-CM | POA: Diagnosis not present

## 2014-01-14 DIAGNOSIS — I2510 Atherosclerotic heart disease of native coronary artery without angina pectoris: Secondary | ICD-10-CM | POA: Diagnosis not present

## 2014-01-14 DIAGNOSIS — R21 Rash and other nonspecific skin eruption: Secondary | ICD-10-CM | POA: Insufficient documentation

## 2014-01-14 DIAGNOSIS — Z9989 Dependence on other enabling machines and devices: Secondary | ICD-10-CM

## 2014-01-14 DIAGNOSIS — G4733 Obstructive sleep apnea (adult) (pediatric): Secondary | ICD-10-CM | POA: Diagnosis not present

## 2014-01-14 DIAGNOSIS — I5042 Chronic combined systolic (congestive) and diastolic (congestive) heart failure: Secondary | ICD-10-CM | POA: Diagnosis not present

## 2014-01-14 DIAGNOSIS — Z79899 Other long term (current) drug therapy: Secondary | ICD-10-CM | POA: Insufficient documentation

## 2014-01-14 DIAGNOSIS — I10 Essential (primary) hypertension: Secondary | ICD-10-CM | POA: Diagnosis not present

## 2014-01-14 DIAGNOSIS — Z794 Long term (current) use of insulin: Secondary | ICD-10-CM | POA: Diagnosis not present

## 2014-01-14 DIAGNOSIS — I4891 Unspecified atrial fibrillation: Secondary | ICD-10-CM | POA: Insufficient documentation

## 2014-01-14 NOTE — Patient Instructions (Signed)
We will be in contact with you by phone to schedule a Hickman catheter to be placed and to have your PICC line removed.  Follow up with our office in 2 weeks with one of our doctors. You will also meet with our Education officer, museum at this time.  Do the following things EVERYDAY: 1) Weigh yourself in the morning before breakfast. Write it down and keep it in a log. 2) Take your medicines as prescribed 3) Eat low salt foods-Limit salt (sodium) to 2000 mg per day.  4) Stay as active as you can everyday 5) Limit all fluids for the day to less than 2 liters
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
## 2014-01-15 ENCOUNTER — Other Ambulatory Visit (HOSPITAL_COMMUNITY): Payer: Self-pay | Admitting: Adult Health

## 2014-01-15 ENCOUNTER — Telehealth (HOSPITAL_COMMUNITY): Payer: Self-pay | Admitting: Adult Health

## 2014-01-15 DIAGNOSIS — I5042 Chronic combined systolic (congestive) and diastolic (congestive) heart failure: Secondary | ICD-10-CM

## 2014-01-15 NOTE — Telephone Encounter (Signed)
Tried to call pt to schedule catheter placement, no answer and no VM. JM

## 2014-01-16 ENCOUNTER — Emergency Department (HOSPITAL_COMMUNITY)
Admission: EM | Admit: 2014-01-16 | Discharge: 2014-01-16 | Disposition: A | Discharge status: Home / Self Care | Payer: BC Managed Care – PPO | Attending: Emergency Medicine | Admitting: Emergency Medicine

## 2014-01-16 ENCOUNTER — Telehealth: Payer: Self-pay | Admitting: Cardiology

## 2014-01-16 ENCOUNTER — Encounter (HOSPITAL_COMMUNITY): Payer: Self-pay | Admitting: *Deleted

## 2014-01-16 ENCOUNTER — Telehealth (HOSPITAL_COMMUNITY): Payer: Self-pay | Admitting: Vascular Surgery

## 2014-01-16 ENCOUNTER — Telehealth (HOSPITAL_COMMUNITY): Payer: Self-pay | Admitting: Anesthesiology

## 2014-01-16 DIAGNOSIS — Z7982 Long term (current) use of aspirin: Secondary | ICD-10-CM | POA: Insufficient documentation

## 2014-01-16 DIAGNOSIS — I10 Essential (primary) hypertension: Secondary | ICD-10-CM | POA: Diagnosis not present

## 2014-01-16 DIAGNOSIS — Z79899 Other long term (current) drug therapy: Secondary | ICD-10-CM | POA: Diagnosis not present

## 2014-01-16 DIAGNOSIS — G4733 Obstructive sleep apnea (adult) (pediatric): Secondary | ICD-10-CM | POA: Diagnosis not present

## 2014-01-16 DIAGNOSIS — R7989 Other specified abnormal findings of blood chemistry: Secondary | ICD-10-CM | POA: Diagnosis present

## 2014-01-16 DIAGNOSIS — Z87440 Personal history of urinary (tract) infections: Secondary | ICD-10-CM | POA: Insufficient documentation

## 2014-01-16 DIAGNOSIS — D688 Other specified coagulation defects: Secondary | ICD-10-CM | POA: Insufficient documentation

## 2014-01-16 DIAGNOSIS — I482 Chronic atrial fibrillation, unspecified: Secondary | ICD-10-CM

## 2014-01-16 DIAGNOSIS — Z7901 Long term (current) use of anticoagulants: Secondary | ICD-10-CM | POA: Diagnosis not present

## 2014-01-16 DIAGNOSIS — I5042 Chronic combined systolic (congestive) and diastolic (congestive) heart failure: Secondary | ICD-10-CM | POA: Diagnosis not present

## 2014-01-16 DIAGNOSIS — Z794 Long term (current) use of insulin: Secondary | ICD-10-CM | POA: Insufficient documentation

## 2014-01-16 DIAGNOSIS — Z9981 Dependence on supplemental oxygen: Secondary | ICD-10-CM | POA: Diagnosis not present

## 2014-01-16 DIAGNOSIS — Z872 Personal history of diseases of the skin and subcutaneous tissue: Secondary | ICD-10-CM | POA: Diagnosis not present

## 2014-01-16 DIAGNOSIS — Z87828 Personal history of other (healed) physical injury and trauma: Secondary | ICD-10-CM | POA: Insufficient documentation

## 2014-01-16 DIAGNOSIS — I2510 Atherosclerotic heart disease of native coronary artery without angina pectoris: Secondary | ICD-10-CM | POA: Diagnosis not present

## 2014-01-16 DIAGNOSIS — R899 Unspecified abnormal finding in specimens from other organs, systems and tissues: Secondary | ICD-10-CM

## 2014-01-16 DIAGNOSIS — E119 Type 2 diabetes mellitus without complications: Secondary | ICD-10-CM | POA: Insufficient documentation

## 2014-01-16 DIAGNOSIS — M1990 Unspecified osteoarthritis, unspecified site: Secondary | ICD-10-CM | POA: Insufficient documentation

## 2014-01-16 LAB — CBC WITH DIFFERENTIAL/PLATELET
Basophils Absolute: 0 10*3/uL (ref 0.0–0.1)
Basophils Relative: 0 % (ref 0–1)
Eosinophils Absolute: 0.3 10*3/uL (ref 0.0–0.7)
Eosinophils Relative: 3 % (ref 0–5)
HCT: 40.4 % (ref 39.0–52.0)
HEMOGLOBIN: 13.2 g/dL (ref 13.0–17.0)
LYMPHS ABS: 1.7 10*3/uL (ref 0.7–4.0)
Lymphocytes Relative: 22 % (ref 12–46)
MCH: 30.8 pg (ref 26.0–34.0)
MCHC: 32.7 g/dL (ref 30.0–36.0)
MCV: 94.2 fL (ref 78.0–100.0)
Monocytes Absolute: 0.8 10*3/uL (ref 0.1–1.0)
Monocytes Relative: 10 % (ref 3–12)
NEUTROS PCT: 65 % (ref 43–77)
Neutro Abs: 5.3 10*3/uL (ref 1.7–7.7)
Platelets: 268 10*3/uL (ref 150–400)
RBC: 4.29 MIL/uL (ref 4.22–5.81)
RDW: 17.2 % — ABNORMAL HIGH (ref 11.5–15.5)
WBC: 8.1 10*3/uL (ref 4.0–10.5)

## 2014-01-16 LAB — BASIC METABOLIC PANEL
ANION GAP: 13 (ref 5–15)
BUN: 20 mg/dL (ref 6–23)
CALCIUM: 8.9 mg/dL (ref 8.4–10.5)
CO2: 29 meq/L (ref 19–32)
Chloride: 101 mEq/L (ref 96–112)
Creatinine, Ser: 0.93 mg/dL (ref 0.50–1.35)
GFR calc Af Amer: >90 mL/min (ref >90)
GFR calc non Af Amer: 88 mL/min — ABNORMAL LOW (ref >90)
GLUCOSE: 69 mg/dL — AB (ref 70–99)
Potassium: 4.4 mEq/L (ref 3.7–5.3)
SODIUM: 143 meq/L (ref 137–147)

## 2014-01-16 LAB — PROTIME-INR
INR: 1.61 — ABNORMAL HIGH (ref 0.00–1.49)
PROTHROMBIN TIME: 19.3 s — AB (ref 11.6–15.2)

## 2014-01-16 NOTE — Discharge Instructions (Signed)
Your blood tests today showed a normal potassium. Please continue taking all of your medications as prescribed. Please follow-up with your PCP in 2 days for a recheck.

## 2014-01-16 NOTE — Telephone Encounter (Signed)
Critical lab potassium greater than 7.5.Raymond Pratt Please advise

## 2014-01-16 NOTE — ED Notes (Signed)
The pt  Is here for a high potassium.  He was just discharged from this hospital mnday  He had lab drawn then and lab drawn yesterday.  He was called at home and was told that he had a HIGH POT.  AND TO COME TO THE ED.   HE HAS RUNNING IV.

## 2014-01-16 NOTE — ED Notes (Signed)
MD at bedside. 

## 2014-01-16 NOTE — Telephone Encounter (Signed)
Multiple attempts made to call patient on his listed phone number, both emergency contacts and a phone number provided through [facility] [phone].  All numbers continue to ring or disconnect.  Left voicemail for patient's daughter to call us back or have patient call back immediately and go to emergency dpartment for potassium level >7.5 Will continue to attempt to reach patient.  Renee Pain

## 2014-01-16 NOTE — Telephone Encounter (Signed)
Pt called. Apparently he had a high  K+ -7.5. He finally got the message and is coming in to have it re checked.   Kerin Ransom PA-C 01/16/2014 5:27 PM
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
## 2014-01-16 NOTE — Telephone Encounter (Signed)
Tried calling patient again about elevated K+ 7.5. Have tried calling all emergency contacts and left VM to go to ED for critical K+ level. Talked to Shella Spearing and phone number wrong for patient and recalled and got in touch with him and told him to go to ED.    Raymond Pratt B NP-C 5:13 PM

## 2014-01-18 ENCOUNTER — Telehealth (HOSPITAL_COMMUNITY): Payer: Self-pay | Admitting: Adult Health

## 2014-01-18 NOTE — Telephone Encounter (Signed)
Made second attempt to reach patient to schedule tunneled cath placement. Left VM. JM

## 2014-01-24 ENCOUNTER — Other Ambulatory Visit (HOSPITAL_COMMUNITY): Payer: Self-pay | Admitting: Internal Medicine

## 2014-01-24 ENCOUNTER — Ambulatory Visit (HOSPITAL_COMMUNITY)
Admission: RE | Admit: 2014-01-24 | Discharge: 2014-01-24 | Disposition: A | Discharge status: Home / Self Care | Payer: BC Managed Care – PPO | Source: Ambulatory Visit | Attending: Internal Medicine | Admitting: Internal Medicine

## 2014-01-24 ENCOUNTER — Telehealth (HOSPITAL_COMMUNITY): Payer: Self-pay | Admitting: *Deleted

## 2014-01-24 DIAGNOSIS — I5022 Chronic systolic (congestive) heart failure: Secondary | ICD-10-CM

## 2014-01-24 DIAGNOSIS — I509 Heart failure, unspecified: Secondary | ICD-10-CM | POA: Diagnosis not present

## 2014-01-24 MED ORDER — LIDOCAINE HCL 1 % IJ SOLN
INTRAMUSCULAR | Status: AC
  Filled 2014-01-24: qty 20

## 2014-01-24 NOTE — Telephone Encounter (Signed)
Pt's home health RN called to report that pt's PICC had came out, called IR and they will replace it tonight, pt aware and will come to radiology now, Carolynn Sayers, RN with [facility] is aware and will arrange for pt to be reconnected

## 2014-01-24 NOTE — Procedures (Signed)
Successful placement of right brachial vein approach 41 cm dual lumen PICC line with tip at the superior caval-atrial junction.  The PICC line is ready for immediate use.

## 2014-01-29 ENCOUNTER — Encounter (HOSPITAL_COMMUNITY): Payer: BC Managed Care – PPO

## 2014-02-04 ENCOUNTER — Encounter: Payer: Self-pay | Admitting: Anesthesiology

## 2014-02-05 ENCOUNTER — Encounter (HOSPITAL_COMMUNITY): Payer: BC Managed Care – PPO

## 2014-02-11 ENCOUNTER — Ambulatory Visit (HOSPITAL_BASED_OUTPATIENT_CLINIC_OR_DEPARTMENT_OTHER)
Admission: RE | Admit: 2014-02-11 | Discharge: 2014-02-11 | Disposition: A | Discharge status: Home / Self Care | Payer: BC Managed Care – PPO | Source: Ambulatory Visit | Attending: Internal Medicine | Admitting: Internal Medicine

## 2014-02-11 ENCOUNTER — Encounter: Payer: Self-pay | Admitting: Internal Medicine

## 2014-02-11 ENCOUNTER — Encounter (HOSPITAL_COMMUNITY): Payer: Self-pay

## 2014-02-11 VITALS — BP 148/88 | HR 138 | Wt 285.4 lb

## 2014-02-11 DIAGNOSIS — I482 Chronic atrial fibrillation: Secondary | ICD-10-CM | POA: Insufficient documentation

## 2014-02-11 DIAGNOSIS — I481 Persistent atrial fibrillation: Secondary | ICD-10-CM | POA: Diagnosis not present

## 2014-02-11 DIAGNOSIS — G4733 Obstructive sleep apnea (adult) (pediatric): Secondary | ICD-10-CM | POA: Diagnosis present

## 2014-02-11 DIAGNOSIS — I5042 Chronic combined systolic (congestive) and diastolic (congestive) heart failure: Secondary | ICD-10-CM | POA: Insufficient documentation

## 2014-02-11 DIAGNOSIS — E669 Obesity, unspecified: Secondary | ICD-10-CM | POA: Insufficient documentation

## 2014-02-11 DIAGNOSIS — I2510 Atherosclerotic heart disease of native coronary artery without angina pectoris: Secondary | ICD-10-CM | POA: Diagnosis present

## 2014-02-11 DIAGNOSIS — Z7982 Long term (current) use of aspirin: Secondary | ICD-10-CM

## 2014-02-11 DIAGNOSIS — I10 Essential (primary) hypertension: Secondary | ICD-10-CM

## 2014-02-11 DIAGNOSIS — I4891 Unspecified atrial fibrillation: Secondary | ICD-10-CM

## 2014-02-11 DIAGNOSIS — H5462 Unqualified visual loss, left eye, normal vision right eye: Secondary | ICD-10-CM | POA: Diagnosis present

## 2014-02-11 DIAGNOSIS — Z833 Family history of diabetes mellitus: Secondary | ICD-10-CM

## 2014-02-11 DIAGNOSIS — R911 Solitary pulmonary nodule: Secondary | ICD-10-CM | POA: Diagnosis present

## 2014-02-11 DIAGNOSIS — M109 Gout, unspecified: Secondary | ICD-10-CM | POA: Diagnosis present

## 2014-02-11 DIAGNOSIS — Z9889 Other specified postprocedural states: Secondary | ICD-10-CM

## 2014-02-11 DIAGNOSIS — I4819 Other persistent atrial fibrillation: Secondary | ICD-10-CM

## 2014-02-11 DIAGNOSIS — E785 Hyperlipidemia, unspecified: Secondary | ICD-10-CM | POA: Diagnosis present

## 2014-02-11 DIAGNOSIS — Z6841 Body Mass Index (BMI) 40.0 and over, adult: Secondary | ICD-10-CM | POA: Diagnosis not present

## 2014-02-11 DIAGNOSIS — R7881 Bacteremia: Secondary | ICD-10-CM | POA: Diagnosis not present

## 2014-02-11 DIAGNOSIS — T80211A Bloodstream infection due to central venous catheter, initial encounter: Principal | ICD-10-CM | POA: Diagnosis present

## 2014-02-11 DIAGNOSIS — Z8249 Family history of ischemic heart disease and other diseases of the circulatory system: Secondary | ICD-10-CM

## 2014-02-11 DIAGNOSIS — R6883 Chills (without fever): Secondary | ICD-10-CM

## 2014-02-11 DIAGNOSIS — Z7901 Long term (current) use of anticoagulants: Secondary | ICD-10-CM

## 2014-02-11 DIAGNOSIS — Z22322 Carrier or suspected carrier of Methicillin resistant Staphylococcus aureus: Secondary | ICD-10-CM

## 2014-02-11 DIAGNOSIS — Z79899 Other long term (current) drug therapy: Secondary | ICD-10-CM

## 2014-02-11 DIAGNOSIS — E119 Type 2 diabetes mellitus without complications: Secondary | ICD-10-CM | POA: Diagnosis present

## 2014-02-11 DIAGNOSIS — F419 Anxiety disorder, unspecified: Secondary | ICD-10-CM | POA: Diagnosis present

## 2014-02-11 DIAGNOSIS — N200 Calculus of kidney: Secondary | ICD-10-CM | POA: Diagnosis present

## 2014-02-11 DIAGNOSIS — I429 Cardiomyopathy, unspecified: Secondary | ICD-10-CM | POA: Insufficient documentation

## 2014-02-11 DIAGNOSIS — I428 Other cardiomyopathies: Secondary | ICD-10-CM | POA: Diagnosis present

## 2014-02-11 DIAGNOSIS — I872 Venous insufficiency (chronic) (peripheral): Secondary | ICD-10-CM | POA: Diagnosis present

## 2014-02-11 DIAGNOSIS — B9689 Other specified bacterial agents as the cause of diseases classified elsewhere: Secondary | ICD-10-CM | POA: Diagnosis present

## 2014-02-11 DIAGNOSIS — K7200 Acute and subacute hepatic failure without coma: Secondary | ICD-10-CM | POA: Diagnosis present

## 2014-02-11 DIAGNOSIS — M545 Low back pain: Secondary | ICD-10-CM | POA: Diagnosis present

## 2014-02-11 LAB — CBC
HCT: 40.8 % (ref 39.0–52.0)
HEMOGLOBIN: 13.1 g/dL (ref 13.0–17.0)
MCH: 30.3 pg (ref 26.0–34.0)
MCHC: 32.1 g/dL (ref 30.0–36.0)
MCV: 94.2 fL (ref 78.0–100.0)
Platelets: 293 10*3/uL (ref 150–400)
RBC: 4.33 MIL/uL (ref 4.22–5.81)
RDW: 17.8 % — ABNORMAL HIGH (ref 11.5–15.5)
WBC: 12.6 10*3/uL — AB (ref 4.0–10.5)

## 2014-02-11 MED ORDER — METOLAZONE 5 MG PO TABS: 5.0000 mg | ORAL_TABLET | ORAL | Status: DC

## 2014-02-11 MED ORDER — POTASSIUM CHLORIDE CRYS ER 20 MEQ PO TBCR: 20.0000 meq | EXTENDED_RELEASE_TABLET | Freq: Two times a day (BID) | ORAL | Status: DC

## 2014-02-11 MED ORDER — AMIODARONE HCL 200 MG PO TABS: 400.0000 mg | ORAL_TABLET | Freq: Two times a day (BID) | ORAL | Status: DC

## 2014-02-11 NOTE — Patient Instructions (Addendum)
Take Metolazone 5 mg every Monday  Take an extra Potassium tab on Mondays with your Metolazone  Increase Amiodarone to 400 mg (2 tabs) Twice daily   [place] will check with INR level next week, if greater than 2 we will call you to schedule a cardioversion  Labs today  Your physician recommends that you schedule a follow-up appointment in: 4 weeks
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
## 2014-02-12 ENCOUNTER — Inpatient Hospital Stay (HOSPITAL_COMMUNITY)
Admission: AD | Admit: 2014-02-12 | Discharge: 2014-02-18 | DRG: 314 | Disposition: A | Discharge status: Home Health | Payer: BC Managed Care – PPO | Source: Ambulatory Visit | Attending: Internal Medicine | Admitting: Internal Medicine

## 2014-02-12 DIAGNOSIS — R911 Solitary pulmonary nodule: Secondary | ICD-10-CM | POA: Diagnosis present

## 2014-02-12 DIAGNOSIS — N200 Calculus of kidney: Secondary | ICD-10-CM | POA: Diagnosis present

## 2014-02-12 DIAGNOSIS — I872 Venous insufficiency (chronic) (peripheral): Secondary | ICD-10-CM | POA: Diagnosis present

## 2014-02-12 DIAGNOSIS — E785 Hyperlipidemia, unspecified: Secondary | ICD-10-CM | POA: Diagnosis present

## 2014-02-12 DIAGNOSIS — I5043 Acute on chronic combined systolic (congestive) and diastolic (congestive) heart failure: Secondary | ICD-10-CM

## 2014-02-12 DIAGNOSIS — E1159 Type 2 diabetes mellitus with other circulatory complications: Secondary | ICD-10-CM | POA: Diagnosis not present

## 2014-02-12 DIAGNOSIS — T80211A Bloodstream infection due to central venous catheter, initial encounter: Secondary | ICD-10-CM | POA: Diagnosis present

## 2014-02-12 DIAGNOSIS — M109 Gout, unspecified: Secondary | ICD-10-CM | POA: Diagnosis present

## 2014-02-12 DIAGNOSIS — K7200 Acute and subacute hepatic failure without coma: Secondary | ICD-10-CM | POA: Diagnosis present

## 2014-02-12 DIAGNOSIS — I481 Persistent atrial fibrillation: Secondary | ICD-10-CM | POA: Diagnosis not present

## 2014-02-12 DIAGNOSIS — Z833 Family history of diabetes mellitus: Secondary | ICD-10-CM | POA: Diagnosis not present

## 2014-02-12 DIAGNOSIS — I10 Essential (primary) hypertension: Secondary | ICD-10-CM | POA: Diagnosis present

## 2014-02-12 DIAGNOSIS — I5042 Chronic combined systolic (congestive) and diastolic (congestive) heart failure: Secondary | ICD-10-CM | POA: Diagnosis present

## 2014-02-12 DIAGNOSIS — Z9989 Dependence on other enabling machines and devices: Secondary | ICD-10-CM

## 2014-02-12 DIAGNOSIS — R7881 Bacteremia: Secondary | ICD-10-CM | POA: Diagnosis present

## 2014-02-12 DIAGNOSIS — Z7901 Long term (current) use of anticoagulants: Secondary | ICD-10-CM | POA: Diagnosis not present

## 2014-02-12 DIAGNOSIS — E1169 Type 2 diabetes mellitus with other specified complication: Secondary | ICD-10-CM | POA: Diagnosis present

## 2014-02-12 DIAGNOSIS — Z9889 Other specified postprocedural states: Secondary | ICD-10-CM | POA: Diagnosis not present

## 2014-02-12 DIAGNOSIS — H5462 Unqualified visual loss, left eye, normal vision right eye: Secondary | ICD-10-CM | POA: Diagnosis present

## 2014-02-12 DIAGNOSIS — I4819 Other persistent atrial fibrillation: Secondary | ICD-10-CM

## 2014-02-12 DIAGNOSIS — Z7982 Long term (current) use of aspirin: Secondary | ICD-10-CM | POA: Diagnosis not present

## 2014-02-12 DIAGNOSIS — I482 Chronic atrial fibrillation: Secondary | ICD-10-CM | POA: Diagnosis present

## 2014-02-12 DIAGNOSIS — Z8249 Family history of ischemic heart disease and other diseases of the circulatory system: Secondary | ICD-10-CM | POA: Diagnosis not present

## 2014-02-12 DIAGNOSIS — F419 Anxiety disorder, unspecified: Secondary | ICD-10-CM | POA: Diagnosis present

## 2014-02-12 DIAGNOSIS — A4150 Gram-negative sepsis, unspecified: Secondary | ICD-10-CM | POA: Diagnosis not present

## 2014-02-12 DIAGNOSIS — Z6841 Body Mass Index (BMI) 40.0 and over, adult: Secondary | ICD-10-CM | POA: Diagnosis not present

## 2014-02-12 DIAGNOSIS — I480 Paroxysmal atrial fibrillation: Secondary | ICD-10-CM | POA: Diagnosis not present

## 2014-02-12 DIAGNOSIS — I4891 Unspecified atrial fibrillation: Secondary | ICD-10-CM | POA: Diagnosis present

## 2014-02-12 DIAGNOSIS — G4733 Obstructive sleep apnea (adult) (pediatric): Secondary | ICD-10-CM | POA: Diagnosis present

## 2014-02-12 DIAGNOSIS — Z22322 Carrier or suspected carrier of Methicillin resistant Staphylococcus aureus: Secondary | ICD-10-CM | POA: Diagnosis not present

## 2014-02-12 DIAGNOSIS — M545 Low back pain: Secondary | ICD-10-CM | POA: Diagnosis present

## 2014-02-12 DIAGNOSIS — Z79899 Other long term (current) drug therapy: Secondary | ICD-10-CM | POA: Diagnosis not present

## 2014-02-12 DIAGNOSIS — I2510 Atherosclerotic heart disease of native coronary artery without angina pectoris: Secondary | ICD-10-CM | POA: Diagnosis present

## 2014-02-12 DIAGNOSIS — I428 Other cardiomyopathies: Secondary | ICD-10-CM | POA: Diagnosis present

## 2014-02-12 DIAGNOSIS — B9689 Other specified bacterial agents as the cause of diseases classified elsewhere: Secondary | ICD-10-CM | POA: Diagnosis present

## 2014-02-12 DIAGNOSIS — E119 Type 2 diabetes mellitus without complications: Secondary | ICD-10-CM | POA: Diagnosis present

## 2014-02-12 HISTORY — DX: Other specified health status: Z78.9

## 2014-02-12 LAB — COMPREHENSIVE METABOLIC PANEL
ALT: 26 U/L (ref 0–53)
AST: 28 U/L (ref 0–37)
Albumin: 3.4 g/dL — ABNORMAL LOW (ref 3.5–5.2)
Alkaline Phosphatase: 97 U/L (ref 39–117)
Anion gap: 11 (ref 5–15)
BUN: 23 mg/dL (ref 6–23)
CALCIUM: 9.3 mg/dL (ref 8.4–10.5)
CO2: 34 mEq/L — ABNORMAL HIGH (ref 19–32)
CREATININE: 1.22 mg/dL (ref 0.50–1.35)
Chloride: 93 mEq/L — ABNORMAL LOW (ref 96–112)
GFR calc non Af Amer: 62 mL/min — ABNORMAL LOW (ref >90)
GFR, EST AFRICAN AMERICAN: 72 mL/min — AB (ref >90)
GLUCOSE: 126 mg/dL — AB (ref 70–99)
Potassium: 4 mEq/L (ref 3.7–5.3)
SODIUM: 138 meq/L (ref 137–147)
Total Bilirubin: 0.8 mg/dL (ref 0.3–1.2)
Total Protein: 7.9 g/dL (ref 6.0–8.3)

## 2014-02-12 LAB — MRSA PCR SCREENING: MRSA BY PCR: POSITIVE — AB

## 2014-02-12 LAB — CBC
HCT: 40.3 % (ref 39.0–52.0)
Hemoglobin: 13.1 g/dL (ref 13.0–17.0)
MCH: 30.6 pg (ref 26.0–34.0)
MCHC: 32.5 g/dL (ref 30.0–36.0)
MCV: 94.2 fL (ref 78.0–100.0)
Platelets: 294 10*3/uL (ref 150–400)
RBC: 4.28 MIL/uL (ref 4.22–5.81)
RDW: 17.9 % — ABNORMAL HIGH (ref 11.5–15.5)
WBC: 8.1 10*3/uL (ref 4.0–10.5)

## 2014-02-12 LAB — PROTIME-INR
INR: 1.75 — ABNORMAL HIGH (ref 0.00–1.49)
PROTHROMBIN TIME: 20.6 s — AB (ref 11.6–15.2)

## 2014-02-12 LAB — GLUCOSE, CAPILLARY: GLUCOSE-CAPILLARY: 122 mg/dL — AB (ref 70–99)

## 2014-02-12 MED ORDER — SPIRONOLACTONE 25 MG PO TABS
25.0000 mg | ORAL_TABLET | Freq: Every day | ORAL | Status: DC
  Administered 2014-02-12 – 2014-02-18 (×7): 25 mg via ORAL
  Filled 2014-02-12 (×7): qty 1

## 2014-02-12 MED ORDER — INSULIN ASPART 100 UNIT/ML ~~LOC~~ SOLN: 0.0000 [IU] | Freq: Every day | SUBCUTANEOUS | Status: DC

## 2014-02-12 MED ORDER — TORSEMIDE 20 MG PO TABS
60.0000 mg | ORAL_TABLET | Freq: Two times a day (BID) | ORAL | Status: DC
  Administered 2014-02-13 – 2014-02-18 (×11): 60 mg via ORAL
  Filled 2014-02-12 (×14): qty 3

## 2014-02-12 MED ORDER — INSULIN ASPART 100 UNIT/ML ~~LOC~~ SOLN
0.0000 [IU] | Freq: Three times a day (TID) | SUBCUTANEOUS | Status: DC
  Administered 2014-02-14: 5 [IU] via SUBCUTANEOUS
  Administered 2014-02-14: 2 [IU] via SUBCUTANEOUS
  Administered 2014-02-15: 3 [IU] via SUBCUTANEOUS
  Administered 2014-02-16 – 2014-02-17 (×2): 2 [IU] via SUBCUTANEOUS

## 2014-02-12 MED ORDER — CHLORHEXIDINE GLUCONATE CLOTH 2 % EX PADS
6.0000 | MEDICATED_PAD | Freq: Every day | CUTANEOUS | Status: AC
  Administered 2014-02-12 – 2014-02-17 (×5): 6 via TOPICAL

## 2014-02-12 MED ORDER — METOLAZONE 5 MG PO TABS: 5.0000 mg | ORAL_TABLET | ORAL | Status: DC

## 2014-02-12 MED ORDER — AMIODARONE HCL 200 MG PO TABS
400.0000 mg | ORAL_TABLET | Freq: Two times a day (BID) | ORAL | Status: DC
  Administered 2014-02-12 – 2014-02-18 (×12): 400 mg via ORAL
  Filled 2014-02-12 (×13): qty 2

## 2014-02-12 MED ORDER — POTASSIUM CHLORIDE CRYS ER 20 MEQ PO TBCR
20.0000 meq | EXTENDED_RELEASE_TABLET | Freq: Two times a day (BID) | ORAL | Status: DC
  Administered 2014-02-12 – 2014-02-16 (×8): 20 meq via ORAL
  Filled 2014-02-12 (×11): qty 1

## 2014-02-12 MED ORDER — ALPRAZOLAM 0.25 MG PO TABS: 0.2500 mg | ORAL_TABLET | Freq: Two times a day (BID) | ORAL | Status: DC | PRN

## 2014-02-12 MED ORDER — ASPIRIN 81 MG PO TABS: 81.0000 mg | ORAL_TABLET | Freq: Every day | ORAL | Status: DC

## 2014-02-12 MED ORDER — DIGOXIN 125 MCG PO TABS
0.1250 mg | ORAL_TABLET | Freq: Every day | ORAL | Status: DC
  Administered 2014-02-13 – 2014-02-18 (×6): 0.125 mg via ORAL
  Filled 2014-02-12 (×6): qty 1

## 2014-02-12 MED ORDER — SODIUM CHLORIDE 0.9 % IV SOLN
INTRAVENOUS | Status: DC
  Administered 2014-02-12: 250 mL via INTRAVENOUS
  Administered 2014-02-14 – 2014-02-15 (×2): via INTRAVENOUS

## 2014-02-12 MED ORDER — ONDANSETRON HCL 4 MG PO TABS
4.0000 mg | ORAL_TABLET | Freq: Three times a day (TID) | ORAL | Status: DC | PRN
  Administered 2014-02-13: 4 mg via ORAL
  Filled 2014-02-12: qty 1

## 2014-02-12 MED ORDER — MILRINONE IN DEXTROSE 20 MG/100ML IV SOLN
0.1250 ug/kg/min | INTRAVENOUS | Status: DC
  Administered 2014-02-12 – 2014-02-14 (×3): 0.125 ug/kg/min via INTRAVENOUS
  Filled 2014-02-12 (×3): qty 100

## 2014-02-12 MED ORDER — MILRINONE IN DEXTROSE 20 MG/100ML IV SOLN
0.1250 ug/kg/min | INTRAVENOUS | Status: DC
Start: 2014-02-12 — End: 2014-02-12

## 2014-02-12 MED ORDER — METOPROLOL SUCCINATE ER 50 MG PO TB24
50.0000 mg | ORAL_TABLET | Freq: Two times a day (BID) | ORAL | Status: DC
  Administered 2014-02-12 – 2014-02-13 (×2): 50 mg via ORAL
  Filled 2014-02-12 (×3): qty 1

## 2014-02-12 MED ORDER — MUPIROCIN 2 % EX OINT
1.0000 "application " | TOPICAL_OINTMENT | Freq: Two times a day (BID) | CUTANEOUS | Status: AC
  Administered 2014-02-13 – 2014-02-17 (×10): 1 via NASAL
  Filled 2014-02-12: qty 22

## 2014-02-12 MED ORDER — MILRINONE IN DEXTROSE 20 MG/100ML IV SOLN
0.2500 ug/kg/min | INTRAVENOUS | Status: DC
Start: 2014-02-12 — End: 2014-02-12

## 2014-02-12 MED ORDER — ASPIRIN 81 MG PO CHEW
81.0000 mg | CHEWABLE_TABLET | Freq: Every day | ORAL | Status: DC
  Administered 2014-02-12 – 2014-02-18 (×7): 81 mg via ORAL
  Filled 2014-02-12 (×7): qty 1

## 2014-02-12 MED ORDER — DEXTROSE 5 % IV SOLN
2.0000 g | Freq: Three times a day (TID) | INTRAVENOUS | Status: DC
  Administered 2014-02-12 – 2014-02-15 (×9): 2 g via INTRAVENOUS
  Filled 2014-02-12 (×12): qty 2

## 2014-02-12 MED ORDER — INSULIN GLARGINE 100 UNIT/ML ~~LOC~~ SOLN
30.0000 [IU] | Freq: Every day | SUBCUTANEOUS | Status: DC
  Administered 2014-02-13 – 2014-02-17 (×4): 30 [IU] via SUBCUTANEOUS
  Filled 2014-02-12 (×6): qty 0.3

## 2014-02-12 MED ORDER — INSULIN ASPART 100 UNIT/ML ~~LOC~~ SOLN
3.0000 [IU] | Freq: Three times a day (TID) | SUBCUTANEOUS | Status: DC
  Administered 2014-02-13 – 2014-02-18 (×14): 3 [IU] via SUBCUTANEOUS

## 2014-02-12 MED ORDER — SIMVASTATIN 20 MG PO TABS
20.0000 mg | ORAL_TABLET | Freq: Every day | ORAL | Status: DC
  Administered 2014-02-13 – 2014-02-18 (×6): 20 mg via ORAL
  Filled 2014-02-12 (×6): qty 1

## 2014-02-12 MED ORDER — ALLOPURINOL 300 MG PO TABS
300.0000 mg | ORAL_TABLET | Freq: Every day | ORAL | Status: DC
Start: 2014-02-13 — End: 2014-02-18
  Administered 2014-02-13 – 2014-02-18 (×6): 300 mg via ORAL
  Filled 2014-02-12 (×6): qty 1

## 2014-02-12 NOTE — Progress Notes (Signed)
Dr. Forde Dandy paged, patient is a direct admit from home. No orders for patient at this time.
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
## 2014-02-12 NOTE — Progress Notes (Signed)
I was called this afternoon with GNR Blood Cx positive. I have been in touch with Dr Haroldine Laws and eventually got in touch c pt. The reason the Cx was done was the pt said that he got chills with the last flush and he did not look great. WBC 12.6 I called pt and he reports he feels the best he has in awhile. I disappointed him and directly admitted him to the CCU. Will need Abx. Will need PIV placed and Milrinone continued via PIV Once PIV in place PICC removed Once bacteremia gone Hickman catheter may need to be placed. He will also need repeat peripheral Cxs and cx the PICC post removal. Weigh pt daily. Page Dr Forde Dandy on arrival to floor.
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
## 2014-02-13 ENCOUNTER — Encounter (HOSPITAL_COMMUNITY): Payer: Self-pay | Admitting: *Deleted

## 2014-02-13 DIAGNOSIS — A4150 Gram-negative sepsis, unspecified: Secondary | ICD-10-CM

## 2014-02-13 DIAGNOSIS — I5042 Chronic combined systolic (congestive) and diastolic (congestive) heart failure: Secondary | ICD-10-CM

## 2014-02-13 DIAGNOSIS — I481 Persistent atrial fibrillation: Secondary | ICD-10-CM

## 2014-02-13 LAB — COMPREHENSIVE METABOLIC PANEL
ALBUMIN: 3.1 g/dL — AB (ref 3.5–5.2)
ALT: 24 U/L (ref 0–53)
ANION GAP: 16 — AB (ref 5–15)
AST: 26 U/L (ref 0–37)
Alkaline Phosphatase: 88 U/L (ref 39–117)
BUN: 23 mg/dL (ref 6–23)
CALCIUM: 9 mg/dL (ref 8.4–10.5)
CHLORIDE: 94 meq/L — AB (ref 96–112)
CO2: 26 mEq/L (ref 19–32)
CREATININE: 1.14 mg/dL (ref 0.50–1.35)
GFR calc Af Amer: 78 mL/min — ABNORMAL LOW (ref >90)
GFR calc non Af Amer: 67 mL/min — ABNORMAL LOW (ref >90)
Glucose, Bld: 119 mg/dL — ABNORMAL HIGH (ref 70–99)
Potassium: 3.7 mEq/L (ref 3.7–5.3)
Sodium: 136 mEq/L — ABNORMAL LOW (ref 137–147)
TOTAL PROTEIN: 7.7 g/dL (ref 6.0–8.3)
Total Bilirubin: 0.8 mg/dL (ref 0.3–1.2)

## 2014-02-13 LAB — CBC
HEMATOCRIT: 41 % (ref 39.0–52.0)
Hemoglobin: 13.3 g/dL (ref 13.0–17.0)
MCH: 30.6 pg (ref 26.0–34.0)
MCHC: 32.4 g/dL (ref 30.0–36.0)
MCV: 94.5 fL (ref 78.0–100.0)
Platelets: 298 10*3/uL (ref 150–400)
RBC: 4.34 MIL/uL (ref 4.22–5.81)
RDW: 17.8 % — AB (ref 11.5–15.5)
WBC: 7.8 10*3/uL (ref 4.0–10.5)

## 2014-02-13 LAB — GLUCOSE, CAPILLARY
GLUCOSE-CAPILLARY: 112 mg/dL — AB (ref 70–99)
Glucose-Capillary: 107 mg/dL — ABNORMAL HIGH (ref 70–99)
Glucose-Capillary: 115 mg/dL — ABNORMAL HIGH (ref 70–99)
Glucose-Capillary: 193 mg/dL — ABNORMAL HIGH (ref 70–99)

## 2014-02-13 LAB — HEMOGLOBIN A1C
Hgb A1c MFr Bld: 7.8 % — ABNORMAL HIGH (ref <5.7)
MEAN PLASMA GLUCOSE: 177 mg/dL — AB (ref <117)

## 2014-02-13 LAB — PROTIME-INR
INR: 1.84 — ABNORMAL HIGH (ref 0.00–1.49)
PROTHROMBIN TIME: 21.4 s — AB (ref 11.6–15.2)

## 2014-02-13 LAB — HEPARIN LEVEL (UNFRACTIONATED): Heparin Unfractionated: 0.18 IU/mL — ABNORMAL LOW (ref 0.30–0.70)

## 2014-02-13 MED ORDER — SODIUM CHLORIDE 0.9 % IV SOLN
250.0000 mL | INTRAVENOUS | Status: DC
  Administered 2014-02-13: 250 mL via INTRAVENOUS

## 2014-02-13 MED ORDER — WARFARIN SODIUM 10 MG PO TABS
10.0000 mg | ORAL_TABLET | Freq: Once | ORAL | Status: AC
  Administered 2014-02-13: 10 mg via ORAL
  Filled 2014-02-13 (×2): qty 1

## 2014-02-13 MED ORDER — METOPROLOL SUCCINATE ER 25 MG PO TB24
25.0000 mg | ORAL_TABLET | Freq: Two times a day (BID) | ORAL | Status: DC
  Administered 2014-02-13 – 2014-02-18 (×10): 25 mg via ORAL
  Filled 2014-02-13 (×12): qty 1

## 2014-02-13 MED ORDER — SODIUM CHLORIDE 0.9 % IJ SOLN
3.0000 mL | Freq: Two times a day (BID) | INTRAMUSCULAR | Status: DC
  Administered 2014-02-13 – 2014-02-16 (×6): 3 mL via INTRAVENOUS

## 2014-02-13 MED ORDER — HEPARIN (PORCINE) IN NACL 100-0.45 UNIT/ML-% IJ SOLN
2000.0000 [IU]/h | INTRAMUSCULAR | Status: DC
  Administered 2014-02-13: 1800 [IU]/h via INTRAVENOUS
  Administered 2014-02-13: 1600 [IU]/h via INTRAVENOUS
  Administered 2014-02-14 – 2014-02-15 (×2): 2000 [IU]/h via INTRAVENOUS
  Filled 2014-02-13 (×7): qty 250

## 2014-02-13 MED ORDER — WARFARIN - PHARMACIST DOSING INPATIENT
Freq: Every day | Status: DC
  Administered 2014-02-13: 18:00:00

## 2014-02-13 MED ORDER — SODIUM CHLORIDE 0.9 % IJ SOLN
3.0000 mL | INTRAMUSCULAR | Status: DC | PRN
  Administered 2014-02-16: 3 mL via INTRAVENOUS
  Filled 2014-02-13: qty 3

## 2014-02-13 MED ORDER — SODIUM CHLORIDE 0.9 % IV SOLN
INTRAVENOUS | Status: DC
  Administered 2014-02-13 – 2014-02-14 (×2): via INTRAVENOUS

## 2014-02-13 NOTE — Progress Notes (Signed)
ANTICOAGULATION CONSULT NOTE - Follow Up Consult  Pharmacy Consult for Heparin Indication: atrial fibrillation  No Known Allergies  Patient Measurements: Height: 5\' 7"  (170.2 cm) Weight: 278 lb 7.1 oz (126.3 kg) IBW/kg (Calculated) : 66.1 Heparin Dosing Weight: 99 kg  Vital Signs: Temp: 97.7 F (36.5 C) (12/09 1953) Temp Source: Oral (12/09 1953) BP: 114/72 mmHg (12/09 2000) Pulse Rate: 85 (12/09 1953)  Labs:  Recent Labs  02/11/14 1601 02/12/14 2100 02/13/14 0214 02/13/14 2103  HGB 13.1 13.1 13.3  --   HCT 40.8 40.3 41.0  --   PLT 293 294 298  --   LABPROT  --  20.6* 21.4*  --   INR  --  1.75* 1.84*  --   HEPARINUNFRC  --   --   --  0.18*  CREATININE  --  1.22 1.14  --     Estimated Creatinine Clearance: 85.7 mL/min (by C-G formula based on Cr of 1.14).   Medications:  Scheduled:  . allopurinol  300 mg Oral Daily  . amiodarone  400 mg Oral BID  . aspirin  81 mg Oral Daily  . cefTAZidime (FORTAZ)  IV  2 g Intravenous 3 times per day  . Chlorhexidine Gluconate Cloth  6 each Topical Q0600  . digoxin  0.125 mg Oral Daily  . insulin aspart  0-15 Units Subcutaneous TID WC  . insulin aspart  0-5 Units Subcutaneous QHS  . insulin aspart  3 Units Subcutaneous TID WC  . insulin glargine  30 Units Subcutaneous QHS  . metoprolol succinate  25 mg Oral BID  . mupirocin ointment  1 application Nasal BID  . potassium chloride SA  20 mEq Oral BID  . simvastatin  20 mg Oral q1800  . sodium chloride  3 mL Intravenous Q12H  . spironolactone  25 mg Oral Daily  . torsemide  60 mg Oral BID  . Warfarin - Pharmacist Dosing Inpatient   Does not apply q1800   Infusions:  . sodium chloride 10 mL/hr at 02/13/14 1300  . sodium chloride 20 mL/hr at 02/13/14 1900  . sodium chloride 250 mL (02/13/14 2133)  . heparin 1,600 Units/hr (02/13/14 1900)  . milrinone 0.125 mcg/kg/min (02/13/14 1900)    Assessment: 63 yo M initiated on heparin therapy today for hx afib.  INR is  subtherapeutic and pt was started on heparin for bridge.  Plans for TEE/DCCV in AM.   No IV issues noted.  Goal of Therapy:  Heparin level 0.3-0.7 units/ml Monitor platelets by anticoagulation protocol: Yes   Plan:  Increase heparin to 1800 units/hr Heparin level in 6 hours Continue daily heparin level and CBC while on heparin  [employer], Pharm.D., BCPS Clinical Pharmacist Pager [phone] 02/13/2014 10:08 PM

## 2014-02-13 NOTE — Progress Notes (Signed)
Patient scheduled for TEE-DC-CV tomorrow. Case # [id number]. Will place orders.

## 2014-02-13 NOTE — Progress Notes (Signed)
[facility]    Patient Status: Active pt with [facility] prior to this readmission  [facility] is providing the following services: HHRN and Home Infusion Pharmacy for home inotropes. [facility] hospital team will follow Mr. Strauss during his hospital stay and support DC home when deemed appropriate    If patient discharges after hours, please call [phone].
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
## 2014-02-13 NOTE — Progress Notes (Addendum)
Nutrition Brief Note  Patient identified on the Malnutrition Screening Tool (MST) Report  Wt Readings from Last 15 Encounters:  02/13/14 278 lb 7.1 oz (126.3 kg)  02/11/14 285 lb 6.4 oz (129.457 kg)  01/16/14 270 lb (122.471 kg)  01/14/14 284 lb 6 oz (128.992 kg)  01/07/14 260 lb 12.9 oz (118.3 kg)  03/07/13 280 lb (127.007 kg)  02/16/13 294 lb (133.358 kg)  01/29/13 280 lb 8 oz (127.234 kg)  01/20/13 276 lb 3.8 oz (125.3 kg)  03/08/12 261 lb 3.2 oz (118.48 kg)   Pt has hx of intentional weight loss. He is followed closely by the Heart Failure Clinic. Wt fluctuations likely due to fluid retention and loss due to diuretic use. Per MD notes, discharge weight at previous admission was 260#.   Body mass index is 43.6 kg/(m^2). Patient meets criteria for extreme obesity, class III based on current BMI.   Current diet order is NPO (due to procedure- scheduled for TEE-DC-CV on 02/14/14), patient is consuming approximately n/a% of meals at this time. Labs and medications reviewed.   No nutrition interventions warranted at this time. If nutrition issues arise, please consult RD.   Evalynn Hankins A. Jimmye Norman, RD, LDN Pager: [phone] After hours Pager: [phone]

## 2014-02-13 NOTE — Progress Notes (Signed)
Transferred in from 2Hroom5 ambulatory, alert.

## 2014-02-13 NOTE — Care Management Note (Signed)
    Page 1 of 1   02/13/2014     8:02:06 AM CARE MANAGEMENT NOTE 02/13/2014  Patient:  Raymond Pratt, Raymond Pratt   Account Number:  [id number]  Date Initiated:  02/13/2014  Documentation initiated by:  Elissa Hefty  Subjective/Objective Assessment:   adm w pos cult, on home milrinone     Action/Plan:   lives at home, pcp dr Virgina Jock   Anticipated DC Date:  02/16/2014   Anticipated DC Plan:  [place]         [facility] Choice  Resumption Of Svcs/PTA Provider   Choice offered to / List presented to:          [facility] arranged  HH-1 RN      [place].   Status of service:   Medicare Important Message given?   (If response is "NO", the following Medicare IM given date fields will be blank) Date Medicare IM given:   Medicare IM given by:   Date Additional Medicare IM given:   Additional Medicare IM given by:    Discharge Disposition:  [place]  Per UR Regulation:  Reviewed for med. necessity/level of care/duration of stay  If discussed at [place] of Stay Meetings, dates discussed:    Comments:

## 2014-02-13 NOTE — Progress Notes (Signed)
Subjective: Admitted c GNR Feels fine. No New C/o In a good mood  Objective: Vital signs in last 24 hours: Temp:  [97.6 F (36.4 C)-98.2 F (36.8 C)] 97.6 F (36.4 C) (12/09 0427) Resp:  [12-27] 21 (12/09 0427) BP: (80-128)/(50-83) 116/50 mmHg (12/09 0427) SpO2:  [94 %-96 %] 94 % (12/09 0015) Weight:  [126.3 kg (278 lb 7.1 oz)] 126.3 kg (278 lb 7.1 oz) (12/09 0500) Weight change:     CBG (last 3)   Recent Labs  02/12/14 2129  GLUCAP 122*    Intake/Output from previous day:  Intake/Output Summary (Last 24 hours) at 02/13/14 0621 Last data filed at 02/13/14 0400  Gross per 24 hour  Intake 376.05 ml  Output    825 ml  Net -448.95 ml   12/08 0701 - 12/09 0700 In: 376.1 [P.O.:240; I.V.:86.1; IV Piggyback:50] Out: 825 [Urine:825]   Physical Exam  General appearance: And O Eyes: no scleral icterus Throat: oropharynx moist without erythema Resp: CTA B Cardio: Irreg Irreg, JVD GI: Obese, soft, non-tender; bowel sounds normal; no masses,  no organomegaly Extremities: Some edema R PICC out.  L PIV noted   Lab Results:  Recent Labs  02/12/14 2100 02/13/14 0214  NA 138 136*  K 4.0 3.7  CL 93* 94*  CO2 34* 26  GLUCOSE 126* 119*  BUN 23 23  CREATININE 1.22 1.14  CALCIUM 9.3 9.0     Recent Labs  02/12/14 2100 02/13/14 0214  AST 28 26  ALT 26 24  ALKPHOS 97 88  BILITOT 0.8 0.8  PROT 7.9 7.7  ALBUMIN 3.4* 3.1*     Recent Labs  02/12/14 2100 02/13/14 0214  WBC 8.1 7.8  HGB 13.1 13.3  HCT 40.3 41.0  MCV 94.2 94.5  PLT 294 298    Lab Results  Component Value Date   INR 1.84* 02/13/2014   INR 1.75* 02/12/2014   INR 1.61* 01/16/2014    No results for input(s): CKTOTAL, CKMB, CKMBINDEX, TROPONINI in the last 72 hours.  No results for input(s): TSH, T4TOTAL, T3FREE, THYROIDAB in the last 72 hours.  Invalid input(s): FREET3  No results for input(s): VITAMINB12, FOLATE, FERRITIN, TIBC, IRON, RETICCTPCT in the last 72 hours.  Micro  Results: Recent Results (from the past 240 hour(s))  Blood culture (routine single)     Status: None (Preliminary result)   Collection Time: 02/11/14  4:35 PM  Result Value Ref Range Status   Specimen Description BLOOD  Final   Special Requests BOTTLES DRAWN AEROBIC AND ANAEROBIC 10CC PICC  Final   Culture  Setup Time   Final    02/12/2014 01:09 Performed at [place] Note: Gram Stain Report Called to,Read Back By and Verified With: DR Staphany Ditton@3 :11PM ON 02/12/14 BY DANTS REPORT FAXED BY REQUEST Performed at [facility]    Report Status PENDING  Incomplete  MRSA PCR Screening     Status: Abnormal   Collection Time: 02/12/14  7:04 PM  Result Value Ref Range Status   MRSA by PCR POSITIVE (A) NEGATIVE Final    Comment:        The GeneXpert MRSA Assay (FDA approved for NASAL specimens only), is one component of a comprehensive MRSA colonization surveillance program. It is not intended to diagnose MRSA infection nor to guide or monitor treatment for MRSA infections. RESULT CALLED TO, READ BACK BY AND VERIFIED WITH: ALLEN,Z RN 2307 02/12/14 MITCHELL,L  Studies/Results: No results found.   Medications: Scheduled: . allopurinol  300 mg Oral Daily  . amiodarone  400 mg Oral BID  . aspirin  81 mg Oral Daily  . cefTAZidime (FORTAZ)  IV  2 g Intravenous 3 times per day  . Chlorhexidine Gluconate Cloth  6 each Topical Q0600  . digoxin  0.125 mg Oral Daily  . insulin aspart  0-15 Units Subcutaneous TID WC  . insulin aspart  0-5 Units Subcutaneous QHS  . insulin aspart  3 Units Subcutaneous TID WC  . insulin glargine  30 Units Subcutaneous QHS  . [START ON 02/18/2014] metolazone  5 mg Oral Q7 days  . metoprolol succinate  50 mg Oral BID  . mupirocin ointment  1 application Nasal BID  . potassium chloride SA  20 mEq Oral BID  . simvastatin  20 mg Oral q1800  . spironolactone  25 mg Oral Daily  . torsemide  60 mg  Oral BID   Continuous: . sodium chloride 250 mL (02/12/14 2203)  . milrinone 0.125 mcg/kg/min (02/12/14 2158)     Assessment/Plan: Principal Problem:   Gram-negative bacteremia Active Problems:   Type 2 diabetes mellitus with vascular disease   ATRIAL FIBRILLATION WITH RAPID VENTRICULAR RESPONSE   Acute on chronic combined systolic and diastolic heart failure, NYHA class 4   Obstructive sleep apnea on CPAP   Warfarin anticoagulation   Chronic combined systolic and diastolic heart failure   HTN (hypertension)   A-fib   Gram neg Rod Bacteremia - PICC Ou and Cath tip Cx Pt. On IV Abx = Fortaz.  Will repeat Blood Cxs again today PIV placed and Milrinone continued via PIV Once bacteremia gone Hickman catheter may need to be placed.  CHF/NICCM/EF 15-20% on 12/29/13 by ECHO - Milrinone Depedendent.  Per HF Team.  Dr Haroldine Laws will see him today,  Current Cr is fine.  Current weight is 278# and at D/c 11/2 he was 260# Milrinone 0.125 mcg Torsemide 60 mg twice a day Metolazone 5mg  q Monday Toprol XL 50 mg twice a day Amiodarone 400 mg twice a day Digoxin 0.125 mg daily Spironolactone 25 mg daily  Kdur 20 meq twice aday.  Shock Liver is back to baseline.  LFTs wnl  OSA - CPAP  AF rate stable. He is on digoxin and Toprol XL.  Considering TEE/DCCV - If this was to be done next week can this be done here in house over next 1-2 days?. Coumadin - Pharm to Dose.  Obesity - Needs wt loss.  DM2 - CBGs fine.  Lantus and SSI - A1C = 7.8-  Recent Labs  02/12/14 2129  GLUCAP 122*   ID -  Anti-infectives    Start     Dose/Rate Route Frequency Ordered Stop   02/12/14 2200  cefTAZidime (FORTAZ) 2 g in dextrose 5 % 50 mL IVPB     2 g100 mL/hr over 30 Minutes Intravenous 3 times per day 02/12/14 2125       DVT Prophylaxis - coumadin.  INR 1.84    LOS: 1 day   Timohty Renbarger M 02/13/2014, 6:21 AM

## 2014-02-13 NOTE — Progress Notes (Addendum)
[facility] for Coumadin Indication: afib  No Known Allergies  Patient Measurements: Height: 5\' 7"  (170.2 cm) Weight: 278 lb 7.1 oz (126.3 kg) IBW/kg (Calculated) : 66.1  Current Weight = 129.5 kg Milrinone Dosing weight per [facility] records = 118 kg  Vital Signs: Temp: 97.9 F (36.6 C) (12/09 0742) Temp Source: Oral (12/09 0742) BP: 123/94 mmHg (12/09 0742) Pulse Rate: 96 (12/09 0742) Intake/Output from previous day: 12/08 0701 - 12/09 0700 In: 376.1 [P.O.:240; I.V.:86.1; IV Piggyback:50] Out: 825 [Urine:825] Intake/Output from this shift:    Labs:  Recent Labs  02/11/14 1601 02/12/14 2100 02/13/14 0214  WBC 12.6* 8.1 7.8  HGB 13.1 13.1 13.3  PLT 293 294 298  CREATININE  --  1.22 1.14   Estimated Creatinine Clearance: 85.7 mL/min (by C-G formula based on Cr of 1.14).    Microbiology: Recent Results (from the past 720 hour(s))  Blood culture (routine single)     Status: None (Preliminary result)   Collection Time: 02/11/14  4:35 PM  Result Value Ref Range Status   Specimen Description BLOOD  Final   Special Requests BOTTLES DRAWN AEROBIC AND ANAEROBIC 10CC PICC  Final   Culture  Setup Time   Final    02/12/2014 01:09 Performed at [place] Note: Gram Stain Report Called to,Read Back By and Verified With: DR RUSSO@3 :11PM ON 02/12/14 BY DANTS REPORT FAXED BY REQUEST Performed at [facility]    Report Status PENDING  Incomplete  MRSA PCR Screening     Status: Abnormal   Collection Time: 02/12/14  7:04 PM  Result Value Ref Range Status   MRSA by PCR POSITIVE (A) NEGATIVE Final    Comment:        The GeneXpert MRSA Assay (FDA approved for NASAL specimens only), is one component of a comprehensive MRSA colonization surveillance program. It is not intended to diagnose MRSA infection nor to guide or monitor treatment for MRSA infections. RESULT CALLED TO, READ BACK BY  AND VERIFIED WITH: ALLEN,Z RN 2307 02/12/14 MITCHELL,L    Assessment: 63 yo M on home milrinone therapy who was seen in HF clinic after [facility] advised him to go to MDs office. Pt reports that he flushed his catheter last Tuesday (12/1) and an hour later felt chills. This recurred on Thursday (12/3). Blood cx from that office visit were obtained and are now growing GNR. Pt was called at home and asked to come back to [facility] for IV abx for GNR bacteremia and now on fortaz (cultures pending)  Coumadin PTA for hx afib; dose adjusted on 12/7 to 7.5mg  daily except 10mg  on Weds/Fri based on INR 1.7 at that visit.INR today= 1.84.  Goal of Therapy:  Renal dose adjustment of antibiotics INR 2-3  Plan:  -Coumadin 10mg  po today -Daily PT/INR  Hildred Laser, Pharm D 02/13/2014 8:23 AM  Addendum: Planning TEE guided cardioversion now that patient hospitalized. INR low will add IV heparin for now until INR responds.  -Start heparin at 1600 units/hr - (previously at goal on this rate) -HL this evening then daily  Erin Hearing PharmD., BCPS Clinical Pharmacist Pager [phone] 02/13/2014 11:56 AM

## 2014-02-13 NOTE — Progress Notes (Signed)
CARDIAC REHAB PHASE I   PRE:  Rate/Rhythm: 89 afib  BP:  Supine:   Sitting: 119/78  Standing:    SaO2: 96%RA  MODE:  Ambulation: 390 ft   POST:  Rate/Rhythm: 118 afib  BP:  Supine:   Sitting: 108/63  Standing:    SaO2: 96%RA 1340-1425 Pt walked 390 ft  With steady gait. Tolerated well. To recliner after walk. Heart rate to 118. No DOE noted.   Graylon Good, RN BSN  02/13/2014 2:20 PM

## 2014-02-13 NOTE — Plan of Care (Signed)
Problem: Phase I Progression Outcomes Goal: Pain controlled with appropriate interventions Outcome: Completed/Met Date Met:  02/13/14 Goal: OOB as tolerated unless otherwise ordered Outcome: Completed/Met Date Met:  02/13/14 Goal: Initial discharge plan identified Outcome: Completed/Met Date Met:  02/13/14 Goal: Voiding-avoid urinary catheter unless indicated Outcome: Completed/Met Date Met:  02/13/14 Goal: Hemodynamically stable Outcome: Completed/Met Date Met:  02/13/14     

## 2014-02-14 ENCOUNTER — Encounter: Payer: Self-pay | Admitting: Internal Medicine

## 2014-02-14 ENCOUNTER — Encounter (HOSPITAL_COMMUNITY): Payer: Self-pay | Admitting: Anesthesiology

## 2014-02-14 ENCOUNTER — Inpatient Hospital Stay (HOSPITAL_COMMUNITY): Payer: BC Managed Care – PPO | Admitting: Anesthesiology

## 2014-02-14 ENCOUNTER — Encounter (HOSPITAL_COMMUNITY)
Admission: AD | Disposition: A | Discharge status: Home Health | Payer: Self-pay | Source: Ambulatory Visit | Attending: Internal Medicine

## 2014-02-14 DIAGNOSIS — B9689 Other specified bacterial agents as the cause of diseases classified elsewhere: Secondary | ICD-10-CM

## 2014-02-14 DIAGNOSIS — E1159 Type 2 diabetes mellitus with other circulatory complications: Secondary | ICD-10-CM

## 2014-02-14 DIAGNOSIS — I4891 Unspecified atrial fibrillation: Secondary | ICD-10-CM

## 2014-02-14 DIAGNOSIS — R7881 Bacteremia: Secondary | ICD-10-CM

## 2014-02-14 HISTORY — PX: CARDIOVERSION: SHX1299

## 2014-02-14 HISTORY — PX: TEE WITHOUT CARDIOVERSION: SHX5443

## 2014-02-14 LAB — CULTURE, BLOOD (SINGLE)

## 2014-02-14 LAB — CBC
HCT: 40.8 % (ref 39.0–52.0)
Hemoglobin: 13.2 g/dL (ref 13.0–17.0)
MCH: 30.4 pg (ref 26.0–34.0)
MCHC: 32.4 g/dL (ref 30.0–36.0)
MCV: 94 fL (ref 78.0–100.0)
Platelets: 321 10*3/uL (ref 150–400)
RBC: 4.34 MIL/uL (ref 4.22–5.81)
RDW: 17.8 % — ABNORMAL HIGH (ref 11.5–15.5)
WBC: 8.7 10*3/uL (ref 4.0–10.5)

## 2014-02-14 LAB — PROTIME-INR
INR: 1.95 — ABNORMAL HIGH (ref 0.00–1.49)
PROTHROMBIN TIME: 22.4 s — AB (ref 11.6–15.2)

## 2014-02-14 LAB — BASIC METABOLIC PANEL
ANION GAP: 12 (ref 5–15)
BUN: 29 mg/dL — ABNORMAL HIGH (ref 6–23)
CHLORIDE: 93 meq/L — AB (ref 96–112)
CO2: 32 meq/L (ref 19–32)
Calcium: 9.3 mg/dL (ref 8.4–10.5)
Creatinine, Ser: 1.12 mg/dL (ref 0.50–1.35)
GFR calc Af Amer: 80 mL/min — ABNORMAL LOW (ref >90)
GFR calc non Af Amer: 69 mL/min — ABNORMAL LOW (ref >90)
Glucose, Bld: 128 mg/dL — ABNORMAL HIGH (ref 70–99)
POTASSIUM: 3.8 meq/L (ref 3.7–5.3)
SODIUM: 137 meq/L (ref 137–147)

## 2014-02-14 LAB — GLUCOSE, CAPILLARY
GLUCOSE-CAPILLARY: 150 mg/dL — AB (ref 70–99)
GLUCOSE-CAPILLARY: 67 mg/dL — AB (ref 70–99)
Glucose-Capillary: 140 mg/dL — ABNORMAL HIGH (ref 70–99)
Glucose-Capillary: 215 mg/dL — ABNORMAL HIGH (ref 70–99)
Glucose-Capillary: 74 mg/dL (ref 70–99)

## 2014-02-14 LAB — HEPARIN LEVEL (UNFRACTIONATED)
Heparin Unfractionated: 0.26 IU/mL — ABNORMAL LOW (ref 0.30–0.70)
Heparin Unfractionated: 0.37 IU/mL (ref 0.30–0.70)

## 2014-02-14 SURGERY — ECHOCARDIOGRAM, TRANSESOPHAGEAL
Anesthesia: Monitor Anesthesia Care

## 2014-02-14 MED ORDER — BUTAMBEN-TETRACAINE-BENZOCAINE 2-2-14 % EX AERO
INHALATION_SPRAY | CUTANEOUS | Status: DC | PRN
Start: 2014-02-14 — End: 2014-02-14
  Administered 2014-02-14: 2 via TOPICAL

## 2014-02-14 MED ORDER — VANCOMYCIN HCL 10 G IV SOLR
1250.0000 mg | Freq: Two times a day (BID) | INTRAVENOUS | Status: DC
  Administered 2014-02-15 – 2014-02-16 (×4): 1250 mg via INTRAVENOUS
  Filled 2014-02-14 (×6): qty 1250

## 2014-02-14 MED ORDER — FENTANYL CITRATE 0.05 MG/ML IJ SOLN
INTRAMUSCULAR | Status: AC
  Filled 2014-02-14: qty 2

## 2014-02-14 MED ORDER — LOSARTAN POTASSIUM 25 MG PO TABS
12.5000 mg | ORAL_TABLET | Freq: Every day | ORAL | Status: DC
Start: 2014-02-14 — End: 2014-02-18
  Administered 2014-02-14 – 2014-02-18 (×5): 12.5 mg via ORAL
  Filled 2014-02-14 (×5): qty 0.5

## 2014-02-14 MED ORDER — WARFARIN SODIUM 7.5 MG PO TABS
7.5000 mg | ORAL_TABLET | Freq: Once | ORAL | Status: AC
  Administered 2014-02-14: 7.5 mg via ORAL
  Filled 2014-02-14: qty 1

## 2014-02-14 MED ORDER — LIDOCAINE HCL (CARDIAC) 20 MG/ML IV SOLN
INTRAVENOUS | Status: DC | PRN
  Administered 2014-02-14: 30 mg via INTRAVENOUS

## 2014-02-14 MED ORDER — MIDAZOLAM HCL 5 MG/ML IJ SOLN
INTRAMUSCULAR | Status: AC
  Filled 2014-02-14: qty 2

## 2014-02-14 MED ORDER — PROPOFOL 10 MG/ML IV BOLUS
INTRAVENOUS | Status: DC | PRN
  Administered 2014-02-14 (×4): 20 mg via INTRAVENOUS

## 2014-02-14 MED ORDER — VANCOMYCIN HCL 10 G IV SOLR
1250.0000 mg | Freq: Once | INTRAVENOUS | Status: AC
  Administered 2014-02-14: 1250 mg via INTRAVENOUS
  Filled 2014-02-14: qty 1250

## 2014-02-14 MED ORDER — LACTATED RINGERS IV SOLN
INTRAVENOUS | Status: DC | PRN
  Administered 2014-02-14: 12:00:00 via INTRAVENOUS

## 2014-02-14 MED ORDER — MIDAZOLAM HCL 5 MG/5ML IJ SOLN
INTRAMUSCULAR | Status: DC | PRN
  Administered 2014-02-14 (×2): 1 mg via INTRAVENOUS

## 2014-02-14 MED ORDER — POTASSIUM CHLORIDE CRYS ER 20 MEQ PO TBCR
40.0000 meq | EXTENDED_RELEASE_TABLET | Freq: Once | ORAL | Status: AC
  Administered 2014-02-14: 40 meq via ORAL

## 2014-02-14 NOTE — Progress Notes (Signed)
ANTICOAGULATION CONSULT NOTE - Follow Up Consult  Pharmacy Consult for Heparin Indication: atrial fibrillation  No Known Allergies  Patient Measurements: Height: 5\' 7"  (170.2 cm) Weight: 273 lb (123.832 kg) IBW/kg (Calculated) : 66.1 Heparin Dosing Weight: 99 kg  Vital Signs: Temp: 98.3 F (36.8 C) (12/10 1645) Temp Source: Oral (12/10 1645) BP: 110/60 mmHg (12/10 1645) Pulse Rate: 74 (12/10 1645)  Labs:  Recent Labs  02/12/14 2100 02/13/14 0214 02/13/14 2103 02/14/14 0244 02/14/14 1645  HGB 13.1 13.3  --  13.2  --   HCT 40.3 41.0  --  40.8  --   PLT 294 298  --  321  --   LABPROT 20.6* 21.4*  --  22.4*  --   INR 1.75* 1.84*  --  1.95*  --   HEPARINUNFRC  --   --  0.18* 0.26* 0.37  CREATININE 1.22 1.14  --  1.12  --     Estimated Creatinine Clearance: 86.3 mL/min (by C-G formula based on Cr of 1.12).   Medications:  Scheduled:  . allopurinol  300 mg Oral Daily  . amiodarone  400 mg Oral BID  . aspirin  81 mg Oral Daily  . cefTAZidime (FORTAZ)  IV  2 g Intravenous 3 times per day  . Chlorhexidine Gluconate Cloth  6 each Topical Q0600  . digoxin  0.125 mg Oral Daily  . insulin aspart  0-15 Units Subcutaneous TID WC  . insulin aspart  0-5 Units Subcutaneous QHS  . insulin aspart  3 Units Subcutaneous TID WC  . insulin glargine  30 Units Subcutaneous QHS  . losartan  12.5 mg Oral Daily  . metoprolol succinate  25 mg Oral BID  . mupirocin ointment  1 application Nasal BID  . potassium chloride SA  20 mEq Oral BID  . simvastatin  20 mg Oral q1800  . sodium chloride  3 mL Intravenous Q12H  . spironolactone  25 mg Oral Daily  . torsemide  60 mg Oral BID  . warfarin  7.5 mg Oral ONCE-1800  . Warfarin - Pharmacist Dosing Inpatient   Does not apply q1800   Infusions:  . sodium chloride 10 mL/hr at 02/13/14 1300  . sodium chloride 20 mL/hr at 02/13/14 1900  . sodium chloride 250 mL (02/13/14 2133)  . heparin 2,000 Units/hr (02/14/14 1556)  . milrinone 0.125  mcg/kg/min (02/14/14 1704)    Assessment: 63 yo M initiated on heparin therapy today for hx afib. Cardioverted to NSR earlier today, heparin level delayed d/t procedures, so re-checked this evening. Heparin level is therapeutic for goal. No complications noted.  Goal of Therapy:  INR 2-3 Heparin level 0.3-0.7 units/ml Monitor platelets by anticoagulation protocol: Yes   Plan:  Continue heparin at 2000 units/hr Continue daily heparin level and CBC while on heparin  Sloan Leiter, PharmD, BCPS Clinical Pharmacist [phone]  02/14/2014 5:35 PM

## 2014-02-14 NOTE — Transfer of Care (Signed)
Immediate Anesthesia Transfer of Care Note  Patient: Raymond Pratt  Procedure(s) Performed: Procedure(s): TRANSESOPHAGEAL ECHOCARDIOGRAM (TEE) (N/A) CARDIOVERSION (N/A)  Patient Location: Endoscopy Unit  Anesthesia Type:MAC  Level of Consciousness: awake  Airway & Oxygen Therapy: Patient Spontanous Breathing and Patient connected to nasal cannula oxygen  Post-op Assessment: Report given to PACU RN and Post -op Vital signs reviewed and stable  Post vital signs: Reviewed and stable  Complications: No apparent anesthesia complications

## 2014-02-14 NOTE — Progress Notes (Signed)
Advanced Heart Failure Rounding Note   Subjective:    Raymond Pratt is a 63 y.o. male with positive PMH of chronic combined systolic and diastolic CHF ([id number]) Echo: EF 20-25%, A-fib w/ RVR (2010 s/p failed cardioversion), DM, HTN and OSA on CPAP.   Admitted to [facility] 10/23 through 01/08/14 with volume overload and low output. He was diuresed with IV lasix and milrinone. He ultimately required home milrinone 0.125 mcg. Discharge weight was 260 pounds. [facility] followed once discharged.   On 11/19 pulled PICC out and replaced by IR. Says he flushed the catheter on 12/1 and 1 hour later had chills. On 12/3 flushed catheter again and had chills. No problems since then. HR was running in the 110-140s. 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   He continued milrinone and was set up for DC-CV due to uncontrolled rate with A fib. Weight down 5 pounds. Denies SOB. Feels good.   INR 1.95    Objective:   Weight Range:  Vital Signs:   Temp:  [97.6 F (36.4 C)-98.6 F (37 C)] 97.8 F (36.6 C) (12/10 0348) Pulse Rate:  [85-99] 93 (12/09 2336) Resp:  [21] 21 (12/09 0742) BP: (98-123)/(63-94) 120/90 mmHg (12/10 0400) SpO2:  [93 %-97 %] 95 % (12/10 0400) Weight:  [273 lb (123.832 kg)] 273 lb (123.832 kg) (12/10 0500)    Weight change: Filed Weights   02/12/14 2100 02/13/14 0500 02/14/14 0500  Weight: 278 lb 7.1 oz (126.3 kg) 278 lb 7.1 oz (126.3 kg) 273 lb (123.832 kg)    Intake/Output:   Intake/Output Summary (Last 24 hours) at 02/14/14 0732 Last data filed at 02/14/14 0600  Gross per 24 hour  Intake 1340.93 ml  Output   3500 ml  Net -2159.07 ml     Physical Exam: General: Chronically ill appearing. No resp difficulty. Sitting on the side of the bed.  HEENT: normal Neck: supple. JVP 7-8. Carotids 2+ bilat; no bruits. No lymphadenopathy or thryomegaly appreciated. Cor: PMI nondisplaced. Irregular rate &  rhythm. No rubs, gallops or murmurs. Lungs: clear Abdomen: obese, soft, nontender, mildly distended. No hepatosplenomegaly. No bruits or masses. Good bowel sounds. Extremities: no cyanosis, clubbing, rash, trace bilateral edema Neuro: alert & orientedx3, cranial nerves grossly intact. moves all 4 extremities w/o difficulty. Affect pleasant  Telemetry: A Fib  Labs: Basic Metabolic Panel:  Recent Labs Lab 02/12/14 2100 02/13/14 0214 02/14/14 0244  NA 138 136* 137  K 4.0 3.7 3.8  CL 93* 94* 93*  CO2 34* 26 32  GLUCOSE 126* 119* 128*  BUN 23 23 29*  CREATININE 1.22 1.14 1.12  CALCIUM 9.3 9.0 9.3    Liver Function Tests:  Recent Labs Lab 02/12/14 2100 02/13/14 0214  AST 28 26  ALT 26 24  ALKPHOS 97 88  BILITOT 0.8 0.8  PROT 7.9 7.7  ALBUMIN 3.4* 3.1*   No results for input(s): LIPASE, AMYLASE in the last 168 hours. No results for input(s): AMMONIA in the last 168 hours.  CBC:  Recent Labs Lab 02/11/14 1601 02/12/14 2100 02/13/14 0214 02/14/14 0244  WBC 12.6* 8.1 7.8 8.7  HGB 13.1 13.1 13.3 13.2  HCT 40.8 40.3 41.0 40.8  MCV 94.2 94.2 94.5 94.0  PLT 293 294 298 321    Cardiac Enzymes: No results for input(s): CKTOTAL, CKMB, CKMBINDEX, TROPONINI in the last 168 hours.  BNP: BNP (last 3 results)  Recent Labs  12/28/13 1210 01/01/14 0415  PROBNP 2624.0* 1882.0*     Other results:    Imaging:  No results found.   Medications:     Scheduled Medications: . allopurinol  300 mg Oral Daily  . amiodarone  400 mg Oral BID  . aspirin  81 mg Oral Daily  . cefTAZidime (FORTAZ)  IV  2 g Intravenous 3 times per day  . Chlorhexidine Gluconate Cloth  6 each Topical Q0600  . digoxin  0.125 mg Oral Daily  . insulin aspart  0-15 Units Subcutaneous TID WC  . insulin aspart  0-5 Units Subcutaneous QHS  . insulin aspart  3 Units Subcutaneous TID WC  . insulin glargine  30 Units Subcutaneous QHS  . metoprolol succinate  25 mg Oral BID  . mupirocin  ointment  1 application Nasal BID  . potassium chloride SA  20 mEq Oral BID  . simvastatin  20 mg Oral q1800  . sodium chloride  3 mL Intravenous Q12H  . spironolactone  25 mg Oral Daily  . torsemide  60 mg Oral BID  . Warfarin - Pharmacist Dosing Inpatient   Does not apply q1800     Infusions: . sodium chloride 10 mL/hr at 02/13/14 1300  . sodium chloride 20 mL/hr at 02/13/14 1900  . sodium chloride 250 mL (02/13/14 2133)  . heparin 2,000 Units/hr (02/14/14 0351)  . milrinone 0.125 mcg/kg/min (02/13/14 1900)     PRN Medications:  ALPRAZolam, ondansetron, sodium chloride   Assessment:    1) Bacteremia - GN rods from blood cx 2) Chronic combined systolic/diastolic HF - on milrinone 0.125 mcg 3) Atrial fibrillation 4) HTN 5) OSA   Plan/Discussion:    Day #2 of Ceftaz for GNR bacteremia.PICC out.  Per Dr Russo--> ID consulted.  Volume status stable. Continue torsemide 60 mg twice a day. On metoprolol 25 mg twice a day. Continue spironolactone 25 mg daily. Add 12.5 mg losartan daily will watch renal function closely. On 25 mg spironolactone daily. Check BMET in am.    Plan for TEE/DC-CV today. On amiodarone 400 mg twice a day. On heparin and coumadin.   Length of Stay: 2   CLEGG,AMY NP-C  02/14/2014, 7:32 AM  Advanced Heart Failure Team Pager [phone] (M-F; [place])  Please contact [facility] for night-coverage after hours (4p -7a ) and weekends on [email or web address]  Much improved today. Volume status looks good. Co-ox good. AF rate much improved. Will proceed with attempt at TEE/DC-CV today on amiodarone.   Will ask ID for their input on the pantoea agglomerans bacteremia. Once surveillance cultures clear will need Hickman to resume home milrinone.   Daniel Bensimhon,MD 10:07 AM

## 2014-02-14 NOTE — Interval H&P Note (Signed)
History and Physical Interval Note:  02/14/2014 12:03 PM  Raymond Pratt  has presented today for surgery, with the diagnosis of a fib   The various methods of treatment have been discussed with the patient and family. After consideration of risks, benefits and other options for treatment, the patient has consented to  Procedure(s): TRANSESOPHAGEAL ECHOCARDIOGRAM (TEE) (N/A) CARDIOVERSION (N/A) as a surgical intervention .  The patient's history has been reviewed, patient examined, no change in status, stable for surgery.  I have reviewed the patient's chart and labs.  Questions were answered to the patient's satisfaction.     Adelaida Reindel [employer]

## 2014-02-14 NOTE — Procedures (Signed)
Electrical Cardioversion Procedure Note Laquavious Karpowicz RA:[id number] 1950/09/19  Procedure: Electrical Cardioversion Indications:  Atrial Fibrillation. The patient is on heparin gtt and coumadin. No LAA thrombus on TEE.   Procedure Details Consent: Risks of procedure as well as the alternatives and risks of each were explained to the (patient/caregiver).  Consent for procedure obtained. Time Out: Verified patient identification, verified procedure, site/side was marked, verified correct patient position, special equipment/implants available, medications/allergies/relevent history reviewed, required imaging and test results available.  Performed  Patient placed on cardiac monitor, pulse oximetry, supplemental oxygen as necessary.  Sedation given: Propofol per anesthesiology.  Pacer pads placed anterior and posterior chest.  Cardioverted 1 time(s).  Cardioverted at [place].  Evaluation Findings: Post procedure EKG shows: NSR Complications: None Patient did tolerate procedure well.   [name] 02/14/2014, 12:46 PM
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
## 2014-02-14 NOTE — Progress Notes (Signed)
  Echocardiogram Echocardiogram Transesophageal has been performed.  Darlina Sicilian M 02/14/2014, 1:04 PM

## 2014-02-14 NOTE — CV Procedure (Signed)
Procedure: TEE  Indication: Atrial fibrillation.   Sedation: Propofol per anesthesiology  Findings: Please see echo section for full report.  Mildly dilated LV with EF 15-20%.  Diffuse hypokinesis.  Moderate to severe mitral regurgitation, likely due to annular dilatation.  Moderately dilated RV with moderately decreased systolic function.  Trivial AI, no AS.  Mild left atrial enlargement but no LAA thrombus.  No evidence of endocarditis.   May proceed to DCCV.   [name] 02/14/2014

## 2014-02-14 NOTE — Progress Notes (Signed)
CARDIAC REHAB PHASE I   PRE:  Rate/Rhythm: 70 SR    BP: sitting 105/72    SaO2:   MODE:  Ambulation: 350 ft   POST:  Rate/Rhythm: 88 SR    BP: sitting 127/75     SaO2:   Tolerated well, no c/o. Sts he thinks he feels less SOB today than yesterday when he walked. Only walked one lap as MD came in. He will walk more later. Maintained SR. [id number]   Raymond Pratt CES, ACSM 02/14/2014 2:32 PM

## 2014-02-14 NOTE — Anesthesia Postprocedure Evaluation (Signed)
  Anesthesia Post-op Note  Patient: Raymond Pratt  Procedure(s) Performed: Procedure(s): TRANSESOPHAGEAL ECHOCARDIOGRAM (TEE) (N/A) CARDIOVERSION (N/A)  Patient Location: PACU and Endoscopy Unit  Anesthesia Type:MAC  Level of Consciousness: awake  Airway and Oxygen Therapy: Patient Spontanous Breathing  Post-op Pain: mild  Post-op Assessment: Post-op Vital signs reviewed  Post-op Vital Signs: Reviewed  Last Vitals:  Filed Vitals:   02/14/14 0917  BP: 180/90  Pulse: 95  Temp:   Resp: 18    Complications: No apparent anesthesia complications

## 2014-02-14 NOTE — Progress Notes (Signed)
Discussed case with ID and after repeat Bl Cxs clear we can switch Fortaz to Bactrim for a full 2 week course. After Cxs clear Dr Haroldine Laws can coordinate the Hickman as well and would not need IV Abx.

## 2014-02-14 NOTE — H&P (View-Only) (Signed)
Advanced Heart Failure Rounding Note   Subjective:    Gregeory Diangelo is a 63 y.o. male with positive PMH of chronic combined systolic and diastolic CHF ([id number]) Echo: EF 20-25%, A-fib w/ RVR (2010 s/p failed cardioversion), DM, HTN and OSA on CPAP.   Admitted to [facility] 10/23 through 01/08/14 with volume overload and low output. He was diuresed with IV lasix and milrinone. He ultimately required home milrinone 0.125 mcg. Discharge weight was 260 pounds. [facility] followed once discharged.   On 11/19 pulled PICC out and replaced by IR. Says he flushed the catheter on 12/1 and 1 hour later had chills. On 12/3 flushed catheter again and had chills. No problems since then. HR was running in the 110-140s. 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   He continued milrinone and was set up for DC-CV due to uncontrolled rate with A fib. Weight down 5 pounds. Denies SOB. Feels good.   INR 1.95    Objective:   Weight Range:  Vital Signs:   Temp:  [97.6 F (36.4 C)-98.6 F (37 C)] 97.8 F (36.6 C) (12/10 0348) Pulse Rate:  [85-99] 93 (12/09 2336) Resp:  [21] 21 (12/09 0742) BP: (98-123)/(63-94) 120/90 mmHg (12/10 0400) SpO2:  [93 %-97 %] 95 % (12/10 0400) Weight:  [273 lb (123.832 kg)] 273 lb (123.832 kg) (12/10 0500)    Weight change: Filed Weights   02/12/14 2100 02/13/14 0500 02/14/14 0500  Weight: 278 lb 7.1 oz (126.3 kg) 278 lb 7.1 oz (126.3 kg) 273 lb (123.832 kg)    Intake/Output:   Intake/Output Summary (Last 24 hours) at 02/14/14 0732 Last data filed at 02/14/14 0600  Gross per 24 hour  Intake 1340.93 ml  Output   3500 ml  Net -2159.07 ml     Physical Exam: General: Chronically ill appearing. No resp difficulty. Sitting on the side of the bed.  HEENT: normal Neck: supple. JVP 7-8. Carotids 2+ bilat; no bruits. No lymphadenopathy or thryomegaly appreciated. Cor: PMI nondisplaced. Irregular rate &  rhythm. No rubs, gallops or murmurs. Lungs: clear Abdomen: obese, soft, nontender, mildly distended. No hepatosplenomegaly. No bruits or masses. Good bowel sounds. Extremities: no cyanosis, clubbing, rash, trace bilateral edema Neuro: alert & orientedx3, cranial nerves grossly intact. moves all 4 extremities w/o difficulty. Affect pleasant  Telemetry: A Fib  Labs: Basic Metabolic Panel:  Recent Labs Lab 02/12/14 2100 02/13/14 0214 02/14/14 0244  NA 138 136* 137  K 4.0 3.7 3.8  CL 93* 94* 93*  CO2 34* 26 32  GLUCOSE 126* 119* 128*  BUN 23 23 29*  CREATININE 1.22 1.14 1.12  CALCIUM 9.3 9.0 9.3    Liver Function Tests:  Recent Labs Lab 02/12/14 2100 02/13/14 0214  AST 28 26  ALT 26 24  ALKPHOS 97 88  BILITOT 0.8 0.8  PROT 7.9 7.7  ALBUMIN 3.4* 3.1*   No results for input(s): LIPASE, AMYLASE in the last 168 hours. No results for input(s): AMMONIA in the last 168 hours.  CBC:  Recent Labs Lab 02/11/14 1601 02/12/14 2100 02/13/14 0214 02/14/14 0244  WBC 12.6* 8.1 7.8 8.7  HGB 13.1 13.1 13.3 13.2  HCT 40.8 40.3 41.0 40.8  MCV 94.2 94.2 94.5 94.0  PLT 293 294 298 321    Cardiac Enzymes: No results for input(s): CKTOTAL, CKMB, CKMBINDEX, TROPONINI in the last 168 hours.  BNP: BNP (last 3 results)  Recent Labs  12/28/13 1210 01/01/14 0415  PROBNP 2624.0* 1882.0*     Other results:    Imaging:  No results found.   Medications:     Scheduled Medications: . allopurinol  300 mg Oral Daily  . amiodarone  400 mg Oral BID  . aspirin  81 mg Oral Daily  . cefTAZidime (FORTAZ)  IV  2 g Intravenous 3 times per day  . Chlorhexidine Gluconate Cloth  6 each Topical Q0600  . digoxin  0.125 mg Oral Daily  . insulin aspart  0-15 Units Subcutaneous TID WC  . insulin aspart  0-5 Units Subcutaneous QHS  . insulin aspart  3 Units Subcutaneous TID WC  . insulin glargine  30 Units Subcutaneous QHS  . metoprolol succinate  25 mg Oral BID  . mupirocin  ointment  1 application Nasal BID  . potassium chloride SA  20 mEq Oral BID  . simvastatin  20 mg Oral q1800  . sodium chloride  3 mL Intravenous Q12H  . spironolactone  25 mg Oral Daily  . torsemide  60 mg Oral BID  . Warfarin - Pharmacist Dosing Inpatient   Does not apply q1800     Infusions: . sodium chloride 10 mL/hr at 02/13/14 1300  . sodium chloride 20 mL/hr at 02/13/14 1900  . sodium chloride 250 mL (02/13/14 2133)  . heparin 2,000 Units/hr (02/14/14 0351)  . milrinone 0.125 mcg/kg/min (02/13/14 1900)     PRN Medications:  ALPRAZolam, ondansetron, sodium chloride   Assessment:    1) Bacteremia - GN rods from blood cx 2) Chronic combined systolic/diastolic HF - on milrinone 0.125 mcg 3) Atrial fibrillation 4) HTN 5) OSA   Plan/Discussion:    Day #2 of Ceftaz for GNR bacteremia.PICC out.  Per Dr Russo--> ID consulted.  Volume status stable. Continue torsemide 60 mg twice a day. On metoprolol 25 mg twice a day. Continue spironolactone 25 mg daily. Add 12.5 mg losartan daily will watch renal function closely. On 25 mg spironolactone daily. Check BMET in am.    Plan for TEE/DC-CV today. On amiodarone 400 mg twice a day. On heparin and coumadin.   Length of Stay: 2   CLEGG,AMY NP-C  02/14/2014, 7:32 AM  Advanced Heart Failure Team Pager [phone] (M-F; [place])  Please contact [facility] for night-coverage after hours (4p -7a ) and weekends on [email or web address]  Much improved today. Volume status looks good. Co-ox good. AF rate much improved. Will proceed with attempt at TEE/DC-CV today on amiodarone.   Will ask ID for their input on the pantoea agglomerans bacteremia. Once surveillance cultures clear will need Hickman to resume home milrinone.   Danila Eddie,MD 10:07 AM

## 2014-02-14 NOTE — Progress Notes (Signed)
ANTICOAGULATION CONSULT NOTE - Follow Up Consult  Pharmacy Consult for Heparin>>warfarin Indication: atrial fibrillation  No Known Allergies  Patient Measurements: Height: 5\' 7"  (170.2 cm) Weight: 273 lb (123.832 kg) IBW/kg (Calculated) : 66.1 Heparin Dosing Weight: 99 kg  Vital Signs: Temp: 97.7 F (36.5 C) (12/10 0755) Temp Source: Oral (12/10 0917) BP: 102/64 mmHg (12/10 1317) Pulse Rate: 75 (12/10 1317)  Labs:  Recent Labs  02/12/14 2100 02/13/14 0214 02/13/14 2103 02/14/14 0244  HGB 13.1 13.3  --  13.2  HCT 40.3 41.0  --  40.8  PLT 294 298  --  321  LABPROT 20.6* 21.4*  --  22.4*  INR 1.75* 1.84*  --  1.95*  HEPARINUNFRC  --   --  0.18* 0.26*  CREATININE 1.22 1.14  --  1.12    Estimated Creatinine Clearance: 86.3 mL/min (by C-G formula based on Cr of 1.12).   Medications:  Scheduled:  . allopurinol  300 mg Oral Daily  . amiodarone  400 mg Oral BID  . aspirin  81 mg Oral Daily  . cefTAZidime (FORTAZ)  IV  2 g Intravenous 3 times per day  . Chlorhexidine Gluconate Cloth  6 each Topical Q0600  . digoxin  0.125 mg Oral Daily  . insulin aspart  0-15 Units Subcutaneous TID WC  . insulin aspart  0-5 Units Subcutaneous QHS  . insulin aspart  3 Units Subcutaneous TID WC  . insulin glargine  30 Units Subcutaneous QHS  . losartan  12.5 mg Oral Daily  . metoprolol succinate  25 mg Oral BID  . mupirocin ointment  1 application Nasal BID  . potassium chloride SA  20 mEq Oral BID  . simvastatin  20 mg Oral q1800  . sodium chloride  3 mL Intravenous Q12H  . spironolactone  25 mg Oral Daily  . torsemide  60 mg Oral BID  . Warfarin - Pharmacist Dosing Inpatient   Does not apply q1800   Infusions:  . sodium chloride 10 mL/hr at 02/13/14 1300  . sodium chloride 20 mL/hr at 02/13/14 1900  . sodium chloride 250 mL (02/13/14 2133)  . heparin 2,000 Units/hr (02/14/14 0351)  . milrinone 0.125 mcg/kg/min (02/13/14 1900)    Assessment: 63 yo M initiated on heparin  therapy today for hx afib.  INR is subtherapeutic at 1.9 and pt was started on heparin for bridge yesterday. Cardioverted to NSR earlier today, heparin level delayed d/t procedures, check this afternoon. No complications noted.  Goal of Therapy:  INR 2-3 Heparin level 0.3-0.7 units/ml Monitor platelets by anticoagulation protocol: Yes   Plan:  Warfarin 7.5mg  tonight Continue heparin at 2000 units/hr Heparin level this afternoon Continue daily heparin level and CBC while on heparin  Erin Hearing PharmD., BCPS Clinical Pharmacist Pager [phone] 02/14/2014 2:27 PM

## 2014-02-14 NOTE — Anesthesia Preprocedure Evaluation (Addendum)
Anesthesia Evaluation  Patient identified by MRN, date of birth, ID band Patient awake    Reviewed: Allergy & Precautions, H&P , NPO status , Patient's Chart, lab work & pertinent test results  Airway Mallampati: III  TM Distance: >3 FB Neck ROM: Full    Dental  (+) Poor Dentition   Pulmonary sleep apnea ,          Cardiovascular hypertension, + CAD, + Peripheral Vascular Disease and +CHF     Neuro/Psych    GI/Hepatic negative GI ROS, Neg liver ROS,   Endo/Other  diabetes  Renal/GU negative Renal ROS     Musculoskeletal   Abdominal   Peds  Hematology   Anesthesia Other Findings   Reproductive/Obstetrics                           Anesthesia Physical Anesthesia Plan  ASA: III  Anesthesia Plan: MAC   Post-op Pain Management:    Induction: Intravenous  Airway Management Planned: Nasal Cannula and Simple Face Mask  Additional Equipment:   Intra-op Plan:   Post-operative Plan:   Informed Consent: I have reviewed the patients History and Physical, chart, labs and discussed the procedure including the risks, benefits and alternatives for the proposed anesthesia with the patient or authorized representative who has indicated his/her understanding and acceptance.   Dental advisory given  Plan Discussed with: CRNA and Anesthesiologist  Anesthesia Plan Comments:        Anesthesia Quick Evaluation

## 2014-02-14 NOTE — Anesthesia Postprocedure Evaluation (Signed)
  Anesthesia Post-op Note  Patient: Raymond Pratt  Procedure(s) Performed: Procedure(s): TRANSESOPHAGEAL ECHOCARDIOGRAM (TEE) (N/A) CARDIOVERSION (N/A)  Patient Location: PACU  Anesthesia Type:MAC  Level of Consciousness: awake  Airway and Oxygen Therapy: Patient Spontanous Breathing  Post-op Pain: mild  Post-op Assessment: Post-op Vital signs reviewed  Post-op Vital Signs: Reviewed  Last Vitals:  Filed Vitals:   02/14/14 1317  BP: 102/64  Pulse: 75  Temp:   Resp: 21    Complications: No apparent anesthesia complications

## 2014-02-14 NOTE — Progress Notes (Signed)
RT spoke with patient about wearing the CPAP and the patient stated that he didn't want to wear the CPAP while he was here in the hospital.  RT advised the patient of the importance of wearing and that if he changed his mind that he could notify the RT dept. And will get him set up.  RT will continue to monitor.
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
## 2014-02-14 NOTE — Progress Notes (Signed)
Subjective: Admitted c GNR Bacteremia and wt gain. Feels fine. No New C/o Going for TEE/DCCV today In a good mood  Objective: Vital signs in last 24 hours: Temp:  [97.6 F (36.4 C)-98.6 F (37 C)] 97.8 F (36.6 C) (12/10 0348) Pulse Rate:  [85-99] 93 (12/09 2336) Resp:  [21] 21 (12/09 0742) BP: (98-123)/(63-94) 120/90 mmHg (12/10 0400) SpO2:  [93 %-97 %] 95 % (12/10 0400) Weight:  [123.832 kg (273 lb)] 123.832 kg (273 lb) (12/10 0500) Weight change: -2.468 kg (-5 lb 7.1 oz)    CBG (last 3)   Recent Labs  02/13/14 1651 02/13/14 1958 02/13/14 2116  GLUCAP 115* 112* 193*    Intake/Output from previous day:  Intake/Output Summary (Last 24 hours) at 02/14/14 0720 Last data filed at 02/14/14 0600  Gross per 24 hour  Intake 1340.93 ml  Output   3500 ml  Net -2159.07 ml   12/09 0701 - 12/10 0700 In: 1340.9 [P.O.:390; I.V.:800.9; IV Piggyback:150] Out: 3500 [Urine:3500]   Physical Exam  General appearance: And O Eyes: no scleral icterus Throat: oropharynx moist without erythema Resp: CTA B Cardio: Irreg Irreg, JVD GI: Obese, soft, non-tender; bowel sounds normal; no masses,  no organomegaly Extremities: Some edema R PICC out.  L PIV noted   Lab Results:  Recent Labs  02/13/14 0214 02/14/14 0244  NA 136* 137  K 3.7 3.8  CL 94* 93*  CO2 26 32  GLUCOSE 119* 128*  BUN 23 29*  CREATININE 1.14 1.12  CALCIUM 9.0 9.3     Recent Labs  02/12/14 2100 02/13/14 0214  AST 28 26  ALT 26 24  ALKPHOS 97 88  BILITOT 0.8 0.8  PROT 7.9 7.7  ALBUMIN 3.4* 3.1*     Recent Labs  02/13/14 0214 02/14/14 0244  WBC 7.8 8.7  HGB 13.3 13.2  HCT 41.0 40.8  MCV 94.5 94.0  PLT 298 321    Lab Results  Component Value Date   INR 1.95* 02/14/2014   INR 1.84* 02/13/2014   INR 1.75* 02/12/2014    No results for input(s): CKTOTAL, CKMB, CKMBINDEX, TROPONINI in the last 72 hours.  No results for input(s): TSH, T4TOTAL, T3FREE, THYROIDAB in the last 72  hours.  Invalid input(s): FREET3  No results for input(s): VITAMINB12, FOLATE, FERRITIN, TIBC, IRON, RETICCTPCT in the last 72 hours.  Micro Results: Recent Results (from the past 240 hour(s))  Blood culture (routine single)     Status: None (Preliminary result)   Collection Time: 02/11/14  4:35 PM  Result Value Ref Range Status   Specimen Description BLOOD  Final   Special Requests BOTTLES DRAWN AEROBIC AND ANAEROBIC 10CC PICC  Final   Culture  Setup Time   Final    02/12/2014 01:09 Performed at [facility] Note: Gram Stain Report Called to,Read Back By and Verified With: DR Sye Schroepfer@3 :11PM ON 02/12/14 BY DANTS REPORT FAXED BY REQUEST Performed at [facility]    Report Status PENDING  Incomplete  MRSA PCR Screening     Status: Abnormal   Collection Time: 02/12/14  7:04 PM  Result Value Ref Range Status   MRSA by PCR POSITIVE (A) NEGATIVE Final    Comment:        The GeneXpert MRSA Assay (FDA approved for NASAL specimens only), is one component of a comprehensive MRSA colonization surveillance program. It is not intended to diagnose MRSA infection nor to  guide or monitor treatment for MRSA infections. RESULT CALLED TO, READ BACK BY AND VERIFIED WITH: ALLEN,Z RN 2307 02/12/14 MITCHELL,L      Studies/Results: No results found.   Medications: Scheduled: . allopurinol  300 mg Oral Daily  . amiodarone  400 mg Oral BID  . aspirin  81 mg Oral Daily  . cefTAZidime (FORTAZ)  IV  2 g Intravenous 3 times per day  . Chlorhexidine Gluconate Cloth  6 each Topical Q0600  . digoxin  0.125 mg Oral Daily  . insulin aspart  0-15 Units Subcutaneous TID WC  . insulin aspart  0-5 Units Subcutaneous QHS  . insulin aspart  3 Units Subcutaneous TID WC  . insulin glargine  30 Units Subcutaneous QHS  . metoprolol succinate  25 mg Oral BID  . mupirocin ointment  1 application Nasal BID  . potassium chloride SA  20 mEq Oral BID  .  simvastatin  20 mg Oral q1800  . sodium chloride  3 mL Intravenous Q12H  . spironolactone  25 mg Oral Daily  . torsemide  60 mg Oral BID  . Warfarin - Pharmacist Dosing Inpatient   Does not apply q1800   Continuous: . sodium chloride 10 mL/hr at 02/13/14 1300  . sodium chloride 20 mL/hr at 02/13/14 1900  . sodium chloride 250 mL (02/13/14 2133)  . heparin 2,000 Units/hr (02/14/14 0351)  . milrinone 0.125 mcg/kg/min (02/13/14 1900)     Assessment/Plan: Principal Problem:   Gram-negative bacteremia Active Problems:   Type 2 diabetes mellitus with vascular disease   ATRIAL FIBRILLATION WITH RAPID VENTRICULAR RESPONSE   Acute on chronic combined systolic and diastolic heart failure, NYHA class 4   Obstructive sleep apnea on CPAP   Warfarin anticoagulation   Chronic combined systolic and diastolic heart failure   HTN (hypertension)   A-fib   Gram neg Rod Bacteremia - Speciation and Sensitivities P.  PICC Out and Cath tip Cx Pending. On IV Abx = Fortaz.  Had repeat Blood Cxs again yesterday and Neg to date.  Will ask ID to comment on drugs and length of time for treatment Milrinone continued via PIV Once bacteremia gone Hickman catheter may need to be placed.  CHF/NICCM/EF 15-20% on 12/29/13 by ECHO - Milrinone Depedendent.  Per HF Team.  Dr Haroldine Laws will see him today,  Current Cr is fine.  Current weight is 273# down 5# from admit and at D/c 11/2 he was 260# Milrinone 0.125 mcg Torsemide 60 mg twice a day Metolazone 5mg  q Monday Toprol XL 50 mg twice a day Amiodarone 400 mg twice a day Digoxin 0.125 mg daily Spironolactone 25 mg daily  Kdur 20 meq twice aday.  Shock Liver is back to baseline.  LFTs wnl Cr back to baseline as well!  OSA - CPAP  AF rate stable. He is on digoxin and Toprol XL.  TEE/DCCV - today.  Maintain anticoagulation - Hep gtt until therapeutic Hopefully can maintain NSR post procedure and maybe wean from Milrinone???  Obesity - Needs wt  loss.  DM2 - CBGs fine.  Lantus and SSI - A1C = 7.8-   Recent Labs  02/13/14 1651 02/13/14 1958 02/13/14 2116  GLUCAP 115* 112* 193*   ID -  Anti-infectives    Start     Dose/Rate Route Frequency Ordered Stop   02/12/14 2200  cefTAZidime (FORTAZ) 2 g in dextrose 5 % 50 mL IVPB     2 g100 mL/hr over 30 Minutes Intravenous 3 times per day  02/12/14 2125       DVT Prophylaxis - coumadin.  Hep gtt until therapeutic   LOS: 2 days   Napolean Sia M 02/14/2014, 7:20 AM

## 2014-02-14 NOTE — Progress Notes (Signed)
CRITICAL VALUE ALERT  Critical value received:  Gram positive cocci in clusters aerobic bottle  Date of notification:  02/14/2014  Time of notification:  1935  Critical value read back:Yes.    Nurse who received alert:  Rosebud Poles RN  MD notified (1st page):  Avva, Ravisankar  Time of first page:  2050  MD notified (2nd page):  Time of second page:  Responding MD:  Prince Solian  Time MD responded:  2100

## 2014-02-14 NOTE — Progress Notes (Signed)
ANTICOAGULATION CONSULT NOTE - Follow Up Consult  Pharmacy Consult for Heparin  Indication: atrial fibrillation  No Known Allergies  Patient Measurements: Height: 5\' 7"  (170.2 cm) Weight: 278 lb 7.1 oz (126.3 kg) IBW/kg (Calculated) : 66.1  Vital Signs: Temp: 97.7 F (36.5 C) (12/09 2336) Temp Source: Oral (12/09 2336) BP: 103/77 mmHg (12/09 2336) Pulse Rate: 93 (12/09 2336)  Labs:  Recent Labs  02/12/14 2100 02/13/14 0214 02/13/14 2103 02/14/14 0244  HGB 13.1 13.3  --  13.2  HCT 40.3 41.0  --  40.8  PLT 294 298  --  321  LABPROT 20.6* 21.4*  --  22.4*  INR 1.75* 1.84*  --  1.95*  HEPARINUNFRC  --   --  0.18* 0.26*  CREATININE 1.22 1.14  --   --     Estimated Creatinine Clearance: 85.7 mL/min (by C-G formula based on Cr of 1.14).   Assessment: Sub-therapeutic heparin level despite rate increase, other labs as above, no issues per RN.   Goal of Therapy:  Heparin level 0.3-0.7 units/ml Monitor platelets by anticoagulation protocol: Yes   Plan:  -Increase heparin drip to 2000 units/hr -1100 HL -Daily CBC/HL -Monitor for bleeding  Narda Bonds 02/14/2014,3:33 AM

## 2014-02-15 ENCOUNTER — Encounter (HOSPITAL_COMMUNITY): Payer: Self-pay | Admitting: Cardiology

## 2014-02-15 DIAGNOSIS — I480 Paroxysmal atrial fibrillation: Secondary | ICD-10-CM

## 2014-02-15 LAB — CBC
HEMATOCRIT: 40.7 % (ref 39.0–52.0)
HEMOGLOBIN: 13.2 g/dL (ref 13.0–17.0)
MCH: 30.5 pg (ref 26.0–34.0)
MCHC: 32.4 g/dL (ref 30.0–36.0)
MCV: 94 fL (ref 78.0–100.0)
Platelets: 320 10*3/uL (ref 150–400)
RBC: 4.33 MIL/uL (ref 4.22–5.81)
RDW: 17.8 % — AB (ref 11.5–15.5)
WBC: 8.4 10*3/uL (ref 4.0–10.5)

## 2014-02-15 LAB — BASIC METABOLIC PANEL
Anion gap: 13 (ref 5–15)
BUN: 27 mg/dL — AB (ref 6–23)
CO2: 30 mEq/L (ref 19–32)
CREATININE: 1.13 mg/dL (ref 0.50–1.35)
Calcium: 9.8 mg/dL (ref 8.4–10.5)
Chloride: 93 mEq/L — ABNORMAL LOW (ref 96–112)
GFR calc non Af Amer: 68 mL/min — ABNORMAL LOW (ref >90)
GFR, EST AFRICAN AMERICAN: 79 mL/min — AB (ref >90)
GLUCOSE: 159 mg/dL — AB (ref 70–99)
Potassium: 4.3 mEq/L (ref 3.7–5.3)
Sodium: 136 mEq/L — ABNORMAL LOW (ref 137–147)

## 2014-02-15 LAB — PROTIME-INR
INR: 2.54 — ABNORMAL HIGH (ref 0.00–1.49)
Prothrombin Time: 27.5 seconds — ABNORMAL HIGH (ref 11.6–15.2)

## 2014-02-15 LAB — GLUCOSE, CAPILLARY
GLUCOSE-CAPILLARY: 151 mg/dL — AB (ref 70–99)
GLUCOSE-CAPILLARY: 114 mg/dL — AB (ref 70–99)
Glucose-Capillary: 104 mg/dL — ABNORMAL HIGH (ref 70–99)
Glucose-Capillary: 106 mg/dL — ABNORMAL HIGH (ref 70–99)

## 2014-02-15 LAB — HEPARIN LEVEL (UNFRACTIONATED): Heparin Unfractionated: 0.67 IU/mL (ref 0.30–0.70)

## 2014-02-15 MED ORDER — SULFAMETHOXAZOLE-TRIMETHOPRIM 800-160 MG PO TABS: 1.0000 | ORAL_TABLET | Freq: Two times a day (BID) | ORAL | Status: DC

## 2014-02-15 MED ORDER — CEFTRIAXONE SODIUM IN DEXTROSE 40 MG/ML IV SOLN
2.0000 g | INTRAVENOUS | Status: DC
  Administered 2014-02-15 – 2014-02-17 (×3): 2 g via INTRAVENOUS
  Filled 2014-02-15 (×3): qty 50

## 2014-02-15 MED ORDER — LOSARTAN POTASSIUM 25 MG PO TABS: 12.5000 mg | ORAL_TABLET | Freq: Every day | ORAL | Status: DC

## 2014-02-15 MED ORDER — WARFARIN SODIUM 5 MG PO TABS
5.0000 mg | ORAL_TABLET | Freq: Once | ORAL | Status: AC
  Administered 2014-02-15: 5 mg via ORAL
  Filled 2014-02-15: qty 1

## 2014-02-15 NOTE — Progress Notes (Addendum)
INFECTIOUS DISEASE PROGRESS NOTE  ID: Raymond Pratt is a 63 y.o. male with  Principal Problem:   Gram-negative bacteremia Active Problems:   Type 2 diabetes mellitus with vascular disease   ATRIAL FIBRILLATION WITH RAPID VENTRICULAR RESPONSE   Acute on chronic combined systolic and diastolic heart failure, NYHA class 4   Obstructive sleep apnea on CPAP   Warfarin anticoagulation   Chronic combined systolic and diastolic heart failure   HTN (hypertension)   A-fib  Subjective: Without complaints  Abtx:  Anti-infectives    Start     Dose/Rate Route Frequency Ordered Stop   02/15/14 1100  vancomycin (VANCOCIN) 1,250 mg in sodium chloride 0.9 % 250 mL IVPB     1,250 mg166.7 mL/hr over 90 Minutes Intravenous Every 12 hours 02/14/14 2118     02/15/14 0000  sulfamethoxazole-trimethoprim (BACTRIM DS,SEPTRA DS) 800-160 MG per tablet     1 tablet Oral 2 times daily 02/15/14 0810 02/26/14 2359   02/14/14 2200  vancomycin (VANCOCIN) 1,250 mg in sodium chloride 0.9 % 250 mL IVPB     1,250 mg166.7 mL/hr over 90 Minutes Intravenous  Once 02/14/14 2118 02/14/14 2313   02/12/14 2200  cefTAZidime (FORTAZ) 2 g in dextrose 5 % 50 mL IVPB     2 g100 mL/hr over 30 Minutes Intravenous 3 times per day 02/12/14 2125        Medications:  Scheduled: . allopurinol  300 mg Oral Daily  . amiodarone  400 mg Oral BID  . aspirin  81 mg Oral Daily  . cefTAZidime (FORTAZ)  IV  2 g Intravenous 3 times per day  . Chlorhexidine Gluconate Cloth  6 each Topical Q0600  . digoxin  0.125 mg Oral Daily  . insulin aspart  0-15 Units Subcutaneous TID WC  . insulin aspart  0-5 Units Subcutaneous QHS  . insulin aspart  3 Units Subcutaneous TID WC  . insulin glargine  30 Units Subcutaneous QHS  . losartan  12.5 mg Oral Daily  . metoprolol succinate  25 mg Oral BID  . mupirocin ointment  1 application Nasal BID  . potassium chloride SA  20 mEq Oral BID  . simvastatin  20 mg Oral q1800  . sodium chloride  3 mL  Intravenous Q12H  . spironolactone  25 mg Oral Daily  . torsemide  60 mg Oral BID  . vancomycin  1,250 mg Intravenous Q12H  . warfarin  5 mg Oral ONCE-1800  . Warfarin - Pharmacist Dosing Inpatient   Does not apply q1800    Objective: Vital signs in last 24 hours: Temp:  [98.2 F (36.8 C)-98.4 F (36.9 C)] 98.2 F (36.8 C) (12/11 0804) Pulse Rate:  [71-81] 71 (12/11 0307) Resp:  [19-23] 20 (12/11 0804) BP: (91-115)/(54-98) 114/98 mmHg (12/11 0804) SpO2:  [93 %-97 %] 97 % (12/11 0804) Weight:  [123.787 kg (272 lb 14.4 oz)] 123.787 kg (272 lb 14.4 oz) (12/11 0702)   General appearance: alert, cooperative and no distress Resp: diminished breath sounds bibasilar Cardio: regular rate and rhythm GI: normal findings: soft, non-tender and abnormal findings:  distended and hypoactive bowel sounds Extremities: edema RUE prev pic site is no d/c, no erythema. non-tender.   Lab Results  Recent Labs  02/14/14 0244 02/15/14 0339  WBC 8.7 8.4  HGB 13.2 13.2  HCT 40.8 40.7  NA 137 136*  K 3.8 4.3  CL 93* 93*  CO2 32 30  BUN 29* 27*  CREATININE 1.12 1.13   Liver Panel  Recent Labs  02/12/14 2100 02/13/14 0214  PROT 7.9 7.7  ALBUMIN 3.4* 3.1*  AST 28 26  ALT 26 24  ALKPHOS 97 88  BILITOT 0.8 0.8   Sedimentation Rate No results for input(s): ESRSEDRATE in the last 72 hours. C-Reactive Protein No results for input(s): CRP in the last 72 hours.  Microbiology: Recent Results (from the past 240 hour(s))  Blood culture (routine single)     Status: None   Collection Time: 02/11/14  4:35 PM  Result Value Ref Range Status   Specimen Description BLOOD  Final   Special Requests BOTTLES DRAWN AEROBIC AND ANAEROBIC 10CC PICC  Final   Culture  Setup Time   Final    02/12/2014 01:09 Performed at [facility]    Culture   Final    PANTOEA AGGLOMERANS Note: Gram Stain Report Called to,Read Back By and Verified With: DR RUSSO@3 :11PM ON 02/12/14 BY DANTS REPORT FAXED BY  REQUEST Performed at [facility]    Report Status 02/14/2014 FINAL  Final   Organism ID, Bacteria PANTOEA AGGLOMERANS  Final      Susceptibility   Pantoea agglomerans - MIC*    CEFAZOLIN <=4 SENSITIVE Sensitive     CEFEPIME <=1 SENSITIVE Sensitive     CEFTAZIDIME <=1 SENSITIVE Sensitive     CEFTRIAXONE <=1 SENSITIVE Sensitive     CIPROFLOXACIN <=0.25 SENSITIVE Sensitive     GENTAMICIN <=1 SENSITIVE Sensitive     IMIPENEM <=0.25 SENSITIVE Sensitive     PIP/TAZO 64 INTERMEDIATE Intermediate     TOBRAMYCIN <=1 SENSITIVE Sensitive     TRIMETH/SULFA <=20 SENSITIVE Sensitive     * PANTOEA AGGLOMERANS  MRSA PCR Screening     Status: Abnormal   Collection Time: 02/12/14  7:04 PM  Result Value Ref Range Status   MRSA by PCR POSITIVE (A) NEGATIVE Final    Comment:        The GeneXpert MRSA Assay (FDA approved for NASAL specimens only), is one component of a comprehensive MRSA colonization surveillance program. It is not intended to diagnose MRSA infection nor to guide or monitor treatment for MRSA infections. RESULT CALLED TO, READ BACK BY AND VERIFIED WITH: ALLEN,Z RN 2307 02/12/14 MITCHELL,L   Cath Tip Culture     Status: None (Preliminary result)   Collection Time: 02/12/14 11:26 PM  Result Value Ref Range Status   Specimen Description CATH TIP  Final   Special Requests NONE  Final   Culture   Final    Culture reincubated for better growth Performed at [facility]    Report Status PENDING  Incomplete  Culture, blood (routine x 2)     Status: None (Preliminary result)   Collection Time: 02/13/14  7:05 AM  Result Value Ref Range Status   Specimen Description BLOOD RIGHT HAND  Final   Special Requests BOTTLES DRAWN AEROBIC ONLY 10CC  Final   Culture  Setup Time   Final    02/13/2014 16:06 Performed at [facility]    Culture   Final    GRAM POSITIVE COCCI IN CLUSTERS Note: Gram Stain Report Called to,Read Back By and Verified With: CHRISTINA WOLF  RN 5P Performed at [facility]    Report Status PENDING  Incomplete  Culture, blood (routine x 2)     Status: None (Preliminary result)   Collection Time: 02/13/14  7:10 AM  Result Value Ref Range Status   Specimen Description BLOOD LEFT HAND  Final   Special Requests  BOTTLES DRAWN AEROBIC AND ANAEROBIC 10CC  Final   Culture  Setup Time   Final    02/13/2014 16:06 Performed at [facility]    Culture   Final           BLOOD CULTURE RECEIVED NO GROWTH TO DATE CULTURE WILL BE HELD FOR 5 DAYS BEFORE ISSUING A FINAL NEGATIVE REPORT Performed at [facility]    Report Status PENDING  Incomplete    Studies/Results: No results found.   Assessment/Plan: P agglomerans bacteremia NICM DM2 BCx 1/2 GPC  Total days of antibiotics  4/14 ceftaz 2 vanco  Await speciation of his GPC. Suspect this is coag neg staph/contaminant. Would continue his anbx for 2 weeks IV Can replace his long term line (or hickman) when his repeat BCx are (-) which they are so far.  He is doing well.  Will f/u BCx over w/e.           Bobby Rumpf Infectious Diseases (pager) [phone] [email or web address] 02/15/2014, 9:41 AM  LOS: 3 days

## 2014-02-15 NOTE — Progress Notes (Addendum)
Advanced Heart Failure Rounding Note   Subjective:    Raymond Pratt is a 63 y.o. male with positive PMH of chronic combined systolic and diastolic CHF ([id number]) Echo: EF 20-25%, A-fib w/ RVR (2010 s/p failed cardioversion), DM, HTN and OSA on CPAP.   Admitted to [facility] 10/23 through 01/08/14 with volume overload and low output. He was diuresed with IV lasix and milrinone. He ultimately required home milrinone 0.125 mcg. Discharge weight was 260 pounds. [facility] followed once discharged.   On 11/19 pulled PICC out and replaced by IR. Says he flushed the catheter on 12/1 and 1 hour later had chills. On 12/3 flushed catheter again and had chills. No problems since then. HR was running in the 110-140s. 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 Underwent TEE-DC-CV (12/10). Remains in NSR. Weight stable. Denies SOB, orthopnea or CP. Cultures repeated and 1/2 growing GPC. Remains on milrinone 0.125 mcg.   INR 2.54   Objective:   Weight Range:  Vital Signs:   Temp:  [98.2 F (36.8 C)-98.4 F (36.9 C)] 98.2 F (36.8 C) (12/11 0804) Pulse Rate:  [71-95] 71 (12/11 0307) Resp:  [18-23] 20 (12/11 0804) BP: (91-180)/(54-98) 114/98 mmHg (12/11 0804) SpO2:  [93 %-97 %] 97 % (12/11 0804) Weight:  [272 lb 14.4 oz (123.787 kg)] 272 lb 14.4 oz (123.787 kg) (12/11 0702) Last BM Date: 02/14/14  Weight change: Filed Weights   02/13/14 0500 02/14/14 0500 02/15/14 0702  Weight: 278 lb 7.1 oz (126.3 kg) 273 lb (123.832 kg) 272 lb 14.4 oz (123.787 kg)    Intake/Output:   Intake/Output Summary (Last 24 hours) at 02/15/14 [id number] Last data filed at 02/15/14 0700  Gross per 24 hour  Intake 2488.2 ml  Output   2700 ml  Net -211.8 ml     Physical Exam: General: Chronically ill appearing. No resp difficulty. Sitting on the side of the bed.  HEENT: normal Neck: supple. JVP 7-8. Carotids 2+ bilat; no bruits. No lymphadenopathy or  thryomegaly appreciated. Cor: PMI nondisplaced. Irregular rate & rhythm. No rubs, gallops or murmurs. Lungs: clear Abdomen: obese, soft, nontender, mildly distended. No hepatosplenomegaly. No bruits or masses. Good bowel sounds. Extremities: no cyanosis, clubbing, rash, trace bilateral edema Neuro: alert & orientedx3, cranial nerves grossly intact. moves all 4 extremities w/o difficulty. Affect pleasant  Telemetry: A Fib  Labs: Basic Metabolic Panel:  Recent Labs Lab 02/12/14 2100 02/13/14 0214 02/14/14 0244 02/15/14 0339  NA 138 136* 137 136*  K 4.0 3.7 3.8 4.3  CL 93* 94* 93* 93*  CO2 34* 26 32 30  GLUCOSE 126* 119* 128* 159*  BUN 23 23 29* 27*  CREATININE 1.22 1.14 1.12 1.13  CALCIUM 9.3 9.0 9.3 9.8    Liver Function Tests:  Recent Labs Lab 02/12/14 2100 02/13/14 0214  AST 28 26  ALT 26 24  ALKPHOS 97 88  BILITOT 0.8 0.8  PROT 7.9 7.7  ALBUMIN 3.4* 3.1*   No results for input(s): LIPASE, AMYLASE in the last 168 hours. No results for input(s): AMMONIA in the last 168 hours.  CBC:  Recent Labs Lab 02/11/14 1601 02/12/14 2100 02/13/14 0214 02/14/14 0244 02/15/14 0339  WBC 12.6* 8.1 7.8 8.7 8.4  HGB 13.1 13.1 13.3 13.2 13.2  HCT 40.8 40.3 41.0 40.8 40.7  MCV 94.2 94.2 94.5 94.0 94.0  PLT 293 294 298 321 320    Cardiac Enzymes: No results for  input(s): CKTOTAL, CKMB, CKMBINDEX, TROPONINI in the last 168 hours.  BNP: BNP (last 3 results)  Recent Labs  12/28/13 1210 01/01/14 0415  PROBNP 2624.0* 1882.0*     Other results:    Imaging: No results found.   Medications:     Scheduled Medications: . allopurinol  300 mg Oral Daily  . amiodarone  400 mg Oral BID  . aspirin  81 mg Oral Daily  . cefTAZidime (FORTAZ)  IV  2 g Intravenous 3 times per day  . Chlorhexidine Gluconate Cloth  6 each Topical Q0600  . digoxin  0.125 mg Oral Daily  . insulin aspart  0-15 Units Subcutaneous TID WC  . insulin aspart  0-5 Units Subcutaneous QHS  .  insulin aspart  3 Units Subcutaneous TID WC  . insulin glargine  30 Units Subcutaneous QHS  . losartan  12.5 mg Oral Daily  . metoprolol succinate  25 mg Oral BID  . mupirocin ointment  1 application Nasal BID  . potassium chloride SA  20 mEq Oral BID  . simvastatin  20 mg Oral q1800  . sodium chloride  3 mL Intravenous Q12H  . spironolactone  25 mg Oral Daily  . torsemide  60 mg Oral BID  . vancomycin  1,250 mg Intravenous Q12H  . Warfarin - Pharmacist Dosing Inpatient   Does not apply q1800    Infusions: . sodium chloride 10 mL/hr at 02/15/14 0551  . sodium chloride 20 mL/hr at 02/14/14 2202  . sodium chloride 250 mL (02/13/14 2133)  . milrinone 0.125 mcg/kg/min (02/14/14 1900)    PRN Medications: ALPRAZolam, ondansetron, sodium chloride   Assessment:    1) Bacteremia - GN rods from blood cx 2) Chronic combined systolic/diastolic HF - on milrinone 0.125 mcg 3) Atrial fibrillation 4) HTN 5) OSA   Plan/Discussion:    Day #3 of Ceftaz for GNR bacteremia.PICC out.  ID consulted. He is also on vancomycin. Repeat cultures 1/2 showed GPC in clusters (possbile contaminate).   TEE-DC-CV yesterday and remains in NSR. Continue amiodarone 400 mg BID for 1 week and then would decrease to 200 mg BID. Heparin stopped and INR now therapeutic.   Dr. Virgina Jock would like to try stopping milrinone and see how he does. With having atrial kick back now he maybe able to tolerate being off milrinone.  My concern is that we may not be able to manage his volume status without milrinone but worth a try.    Length of Stay: 3 Rande Brunt NP-C  02/15/2014, 8:32 AM  Advanced Heart Failure Team Pager [phone] (M-F; 7a - 4p)  Please contact [facility] for night-coverage after hours (4p -7a ) and weekends on [email or web address]  Patient seen and examined with Junie Bame, NP. We discussed all aspects of the encounter. I agree with the assessment and plan as stated above.   Now back in NSR  after DC-CV. Continue amio. Feels well. Will hold milrinone for now but I suspect he may need it back soon. If he needs back can place Hickman once surveillance cx are negative. I favor maintaining milrinone. He will need central access for home abx in either case.   Daniel Bensimhon,MD 1:40 PM

## 2014-02-15 NOTE — Progress Notes (Signed)
Subjective: Admitted c GNR Bacteremia and wt gain. Feels fine. No New C/o S/P TEE/DCCV 12/10 and maintaining NSR on Tele In a good mood He is happy to maintain NSR at this point  Objective: Vital signs in last 24 hours: Temp:  [97.7 F (36.5 C)-98.4 F (36.9 C)] 98.2 F (36.8 C) (12/11 0307) Pulse Rate:  [71-95] 71 (12/11 0307) Resp:  [18-23] 21 (12/10 1317) BP: (91-180)/(54-90) 109/75 mmHg (12/11 0307) SpO2:  [93 %-97 %] 96 % (12/11 0307) Weight:  [123.787 kg (272 lb 14.4 oz)] 123.787 kg (272 lb 14.4 oz) (12/11 0702) Weight change:  Last BM Date: 02/14/14  CBG (last 3)   Recent Labs  02/14/14 1640 02/14/14 2059 02/14/14 2101  GLUCAP 215* 67* 74    Intake/Output from previous day:  Intake/Output Summary (Last 24 hours) at 02/15/14 0727 Last data filed at 02/15/14 0700  Gross per 24 hour  Intake 2512.6 ml  Output   2700 ml  Net -187.4 ml   12/10 0701 - 12/11 0700 In: 2512.6 [P.O.:732; I.V.:1380.6; IV Piggyback:400] Out: 2700 [Urine:2700]   Physical Exam  General appearance: And O Eyes: no scleral icterus Throat: oropharynx moist without erythema Resp: CTA B Cardio: REG, JVD GI: Obese, soft, non-tender; bowel sounds normal; no masses,  no organomegaly Extremities: Some edema R PICC out.  L PIV noted   Lab Results:  Recent Labs  02/14/14 0244 02/15/14 0339  NA 137 136*  K 3.8 4.3  CL 93* 93*  CO2 32 30  GLUCOSE 128* 159*  BUN 29* 27*  CREATININE 1.12 1.13  CALCIUM 9.3 9.8     Recent Labs  02/12/14 2100 02/13/14 0214  AST 28 26  ALT 26 24  ALKPHOS 97 88  BILITOT 0.8 0.8  PROT 7.9 7.7  ALBUMIN 3.4* 3.1*     Recent Labs  02/14/14 0244 02/15/14 0339  WBC 8.7 8.4  HGB 13.2 13.2  HCT 40.8 40.7  MCV 94.0 94.0  PLT 321 320    Lab Results  Component Value Date   INR 2.54* 02/15/2014   INR 1.95* 02/14/2014   INR 1.84* 02/13/2014    No results for input(s): CKTOTAL, CKMB, CKMBINDEX, TROPONINI in the last 72 hours.  No  results for input(s): TSH, T4TOTAL, T3FREE, THYROIDAB in the last 72 hours.  Invalid input(s): FREET3  No results for input(s): VITAMINB12, FOLATE, FERRITIN, TIBC, IRON, RETICCTPCT in the last 72 hours.  Micro Results: Recent Results (from the past 240 hour(s))  Blood culture (routine single)     Status: None   Collection Time: 02/11/14  4:35 PM  Result Value Ref Range Status   Specimen Description BLOOD  Final   Special Requests BOTTLES DRAWN AEROBIC AND ANAEROBIC 10CC PICC  Final   Culture  Setup Time   Final    02/12/2014 01:09 Performed at [facility]    Culture   Final    PANTOEA AGGLOMERANS Note: Gram Stain Report Called to,Read Back By and Verified With: DR Alexes Lamarque@3 :11PM ON 02/12/14 BY DANTS REPORT FAXED BY REQUEST Performed at [facility]    Report Status 02/14/2014 FINAL  Final   Organism ID, Bacteria PANTOEA AGGLOMERANS  Final      Susceptibility   Pantoea agglomerans - MIC*    CEFAZOLIN <=4 SENSITIVE Sensitive     CEFEPIME <=1 SENSITIVE Sensitive     CEFTAZIDIME <=1 SENSITIVE Sensitive     CEFTRIAXONE <=1 SENSITIVE Sensitive     CIPROFLOXACIN <=0.25 SENSITIVE Sensitive  GENTAMICIN <=1 SENSITIVE Sensitive     IMIPENEM <=0.25 SENSITIVE Sensitive     PIP/TAZO 64 INTERMEDIATE Intermediate     TOBRAMYCIN <=1 SENSITIVE Sensitive     TRIMETH/SULFA <=20 SENSITIVE Sensitive     * PANTOEA AGGLOMERANS  MRSA PCR Screening     Status: Abnormal   Collection Time: 02/12/14  7:04 PM  Result Value Ref Range Status   MRSA by PCR POSITIVE (A) NEGATIVE Final    Comment:        The GeneXpert MRSA Assay (FDA approved for NASAL specimens only), is one component of a comprehensive MRSA colonization surveillance program. It is not intended to diagnose MRSA infection nor to guide or monitor treatment for MRSA infections. RESULT CALLED TO, READ BACK BY AND VERIFIED WITH: ALLEN,Z RN 2307 02/12/14 MITCHELL,L   Cath Tip Culture     Status: None (Preliminary  result)   Collection Time: 02/12/14 11:26 PM  Result Value Ref Range Status   Specimen Description CATH TIP  Final   Special Requests NONE  Final   Culture   Final    Culture reincubated for better growth Performed at [facility]    Report Status PENDING  Incomplete  Culture, blood (routine x 2)     Status: None (Preliminary result)   Collection Time: 02/13/14  7:05 AM  Result Value Ref Range Status   Specimen Description BLOOD RIGHT HAND  Final   Special Requests BOTTLES DRAWN AEROBIC ONLY 10CC  Final   Culture  Setup Time   Final    02/13/2014 16:06 Performed at [facility]    Culture   Final    GRAM POSITIVE COCCI IN CLUSTERS Note: Gram Stain Report Called to,Read Back By and Verified With: CHRISTINA WOLF RN 34P Performed at [facility]    Report Status PENDING  Incomplete  Culture, blood (routine x 2)     Status: None (Preliminary result)   Collection Time: 02/13/14  7:10 AM  Result Value Ref Range Status   Specimen Description BLOOD LEFT HAND  Final   Special Requests BOTTLES DRAWN AEROBIC AND ANAEROBIC 10CC  Final   Culture  Setup Time   Final    02/13/2014 16:06 Performed at [facility]    Culture   Final           BLOOD CULTURE RECEIVED NO GROWTH TO DATE CULTURE WILL BE HELD FOR 5 DAYS BEFORE ISSUING A FINAL NEGATIVE REPORT Performed at [facility]    Report Status PENDING  Incomplete     Studies/Results: No results found.   Medications: Scheduled: . allopurinol  300 mg Oral Daily  . amiodarone  400 mg Oral BID  . aspirin  81 mg Oral Daily  . cefTAZidime (FORTAZ)  IV  2 g Intravenous 3 times per day  . Chlorhexidine Gluconate Cloth  6 each Topical Q0600  . digoxin  0.125 mg Oral Daily  . insulin aspart  0-15 Units Subcutaneous TID WC  . insulin aspart  0-5 Units Subcutaneous QHS  . insulin aspart  3 Units Subcutaneous TID WC  . insulin glargine  30 Units Subcutaneous QHS  . losartan  12.5 mg Oral Daily  .  metoprolol succinate  25 mg Oral BID  . mupirocin ointment  1 application Nasal BID  . potassium chloride SA  20 mEq Oral BID  . simvastatin  20 mg Oral q1800  . sodium chloride  3 mL Intravenous Q12H  . spironolactone  25 mg  Oral Daily  . torsemide  60 mg Oral BID  . vancomycin  1,250 mg Intravenous Q12H  . Warfarin - Pharmacist Dosing Inpatient   Does not apply q1800   Continuous: . sodium chloride 10 mL/hr at 02/15/14 0551  . sodium chloride 20 mL/hr at 02/14/14 2202  . sodium chloride 250 mL (02/13/14 2133)  . heparin 2,000 Units/hr (02/15/14 0406)  . milrinone 0.125 mcg/kg/min (02/14/14 1900)     Assessment/Plan: Principal Problem:   Gram-negative bacteremia Active Problems:   Type 2 diabetes mellitus with vascular disease   ATRIAL FIBRILLATION WITH RAPID VENTRICULAR RESPONSE   Acute on chronic combined systolic and diastolic heart failure, NYHA class 4   Obstructive sleep apnea on CPAP   Warfarin anticoagulation   Chronic combined systolic and diastolic heart failure   HTN (hypertension)   A-fib   PANTOEA AGGLOMERANS Bacteremia -  Pan S x for Zosyn.   PICC Out and Cath tip Cx -- Culture reincubated for better growth Performed at [facility].  Remains On IV Abx = Fortaz.  Can switch to PO Septra soon. Appreciate ID input Had repeat Blood Cxs and 1/2 - for GPC in clusters - Will ask ID to weigh in on this as my presumption is this is a contaminate as he has never been ill.  Await speciation.  Vanc given Milrinone continued via PIV Once bacteremia gone Hickman catheter may need to be placed?  CHF/NICCM/EF 15-20% on 12/29/13 by ECHO - Milrinone Depedendent.  Per HF Team.  Cr remains fine.  Current weight is 272# down 6# from admit and at D/c 11/2 he was 260# Milrinone 0.125 mcg Torsemide 60 mg twice a day Metolazone 5mg  q Monday Toprol XL 50 mg twice a day Amiodarone 400 mg twice a day Digoxin 0.125 mg daily Spironolactone 25 mg daily  Kdur 20 meq twice  aday.  Shock Liver is back to baseline.  LFTs wnl Cr back to baseline as well!  OSA - CPAP  AF - Now in NSR post TEE/DCCV - 12/10  And remains on digoxin, Amio and Toprol XL.  Maintain anticoagulation -INR now therapeutic Hopefully can maintain NSR post procedure and maybe wean from Milrinone???  Obesity - Needs wt loss.  DM2 - CBGs fine.  Lantus and SSI - A1C = 7.8-   Recent Labs  02/14/14 1640 02/14/14 2059 02/14/14 2101  GLUCAP 215* 67* 74   ID -  Anti-infectives    Start     Dose/Rate Route Frequency Ordered Stop   02/15/14 1100  vancomycin (VANCOCIN) 1,250 mg in sodium chloride 0.9 % 250 mL IVPB     1,250 mg166.7 mL/hr over 90 Minutes Intravenous Every 12 hours 02/14/14 2118     02/14/14 2200  vancomycin (VANCOCIN) 1,250 mg in sodium chloride 0.9 % 250 mL IVPB     1,250 mg166.7 mL/hr over 90 Minutes Intravenous  Once 02/14/14 2118 02/14/14 2313   02/12/14 2200  cefTAZidime (FORTAZ) 2 g in dextrose 5 % 50 mL IVPB     2 g100 mL/hr over 30 Minutes Intravenous 3 times per day 02/12/14 2125       DVT Prophylaxis - coumadin.     I would like ID to weigh in on GPC Blood Cx and Cards to see if Milrinone can be weaned or when Hickman can be placed.   LOS: 3 days   Donye Campanelli M 02/15/2014, 7:27 AM

## 2014-02-15 NOTE — Progress Notes (Signed)
Patient has stated that he doesn't want to wear the CPAP while in the hospital because he doesn't get proper rest. RT advised patient of the importance and that if he changes his mind that he advise the RN and a CPAP will be provided. RT will continue to monitor.

## 2014-02-15 NOTE — Progress Notes (Signed)
[place] for Heparin>>warfarin Indication: atrial fibrillation  No Known Allergies  Patient Measurements: Height: 5\' 7"  (170.2 cm) Weight: 272 lb 14.4 oz (123.787 kg) IBW/kg (Calculated) : 66.1 Heparin Dosing Weight: 99 kg  Vital Signs: Temp: 98.2 F (36.8 C) (12/11 0804) Temp Source: Oral (12/11 0804) BP: 114/98 mmHg (12/11 0804) Pulse Rate: 71 (12/11 0307)  Labs:  Recent Labs  02/13/14 0214  02/14/14 0244 02/14/14 1645 02/15/14 0339  HGB 13.3  --  13.2  --  13.2  HCT 41.0  --  40.8  --  40.7  PLT 298  --  321  --  320  LABPROT 21.4*  --  22.4*  --  27.5*  INR 1.84*  --  1.95*  --  2.54*  HEPARINUNFRC  --   < > 0.26* 0.37 0.67  CREATININE 1.14  --  1.12  --  1.13  < > = values in this interval not displayed.  Estimated Creatinine Clearance: 85.5 mL/min (by C-G formula based on Cr of 1.13).   Medications:  Scheduled:  . allopurinol  300 mg Oral Daily  . amiodarone  400 mg Oral BID  . aspirin  81 mg Oral Daily  . cefTAZidime (FORTAZ)  IV  2 g Intravenous 3 times per day  . Chlorhexidine Gluconate Cloth  6 each Topical Q0600  . digoxin  0.125 mg Oral Daily  . insulin aspart  0-15 Units Subcutaneous TID WC  . insulin aspart  0-5 Units Subcutaneous QHS  . insulin aspart  3 Units Subcutaneous TID WC  . insulin glargine  30 Units Subcutaneous QHS  . losartan  12.5 mg Oral Daily  . metoprolol succinate  25 mg Oral BID  . mupirocin ointment  1 application Nasal BID  . potassium chloride SA  20 mEq Oral BID  . simvastatin  20 mg Oral q1800  . sodium chloride  3 mL Intravenous Q12H  . spironolactone  25 mg Oral Daily  . torsemide  60 mg Oral BID  . vancomycin  1,250 mg Intravenous Q12H  . Warfarin - Pharmacist Dosing Inpatient   Does not apply q1800   Infusions:  . sodium chloride 10 mL/hr at 02/15/14 0551  . sodium chloride 20 mL/hr at 02/14/14 2202  . sodium chloride 250 mL (02/13/14 2133)  . milrinone 0.125 mcg/kg/min  (02/14/14 1900)    Assessment: 63 yo M on heparin/warfarin therapy for hx afib.  INR is now therapeutic at 2.5 and heparin has been stopped. Cardioverted to NSR yesterday and has maintained. Will give 5mg  of warfarin tonight. **Noted plans to discharge on bactrim at d/c, will likely need to decrease warfarin dosing and follow closely as outpatient, unless ID has different recs on discharge abx  Bacteremia - GNR (agglomerans) - pan sensitive, complicated by GPC in 1/2 bld cxs and started on vanc last night, continue on fortaz likely until ready for d/c. No fever, wbc 8, scr stable 1.1.  Goal of Therapy:  INR 2-3 Vancomycin trough 15-20 Heparin level 0.3-0.7 units/ml Monitor platelets by anticoagulation protocol: Yes   Plan:  Warfarin 5mg  tonight Stop heparin Continue vancomycin and fortaz  Follow up cx data for abx changes   Erin Hearing PharmD., BCPS Clinical Pharmacist Pager [phone] 02/15/2014 8:27 AM

## 2014-02-16 LAB — CATH TIP CULTURE: Culture: 15

## 2014-02-16 LAB — BASIC METABOLIC PANEL
Anion gap: 12 (ref 5–15)
BUN: 31 mg/dL — AB (ref 6–23)
CALCIUM: 9.8 mg/dL (ref 8.4–10.5)
CO2: 30 mEq/L (ref 19–32)
CREATININE: 1.21 mg/dL (ref 0.50–1.35)
Chloride: 95 mEq/L — ABNORMAL LOW (ref 96–112)
GFR, EST AFRICAN AMERICAN: 72 mL/min — AB (ref >90)
GFR, EST NON AFRICAN AMERICAN: 62 mL/min — AB (ref >90)
GLUCOSE: 144 mg/dL — AB (ref 70–99)
Potassium: 4.6 mEq/L (ref 3.7–5.3)
Sodium: 137 mEq/L (ref 137–147)

## 2014-02-16 LAB — CULTURE, BLOOD (ROUTINE X 2)

## 2014-02-16 LAB — GLUCOSE, CAPILLARY
GLUCOSE-CAPILLARY: 110 mg/dL — AB (ref 70–99)
GLUCOSE-CAPILLARY: 94 mg/dL (ref 70–99)
Glucose-Capillary: 127 mg/dL — ABNORMAL HIGH (ref 70–99)
Glucose-Capillary: 124 mg/dL — ABNORMAL HIGH (ref 70–99)

## 2014-02-16 LAB — PROTIME-INR
INR: 2.8 — ABNORMAL HIGH (ref 0.00–1.49)
Prothrombin Time: 29.7 seconds — ABNORMAL HIGH (ref 11.6–15.2)

## 2014-02-16 MED ORDER — POTASSIUM CHLORIDE CRYS ER 20 MEQ PO TBCR
20.0000 meq | EXTENDED_RELEASE_TABLET | Freq: Every day | ORAL | Status: DC
  Administered 2014-02-17 – 2014-02-18 (×2): 20 meq via ORAL
  Filled 2014-02-16 (×2): qty 1

## 2014-02-16 MED ORDER — WARFARIN SODIUM 5 MG PO TABS
5.0000 mg | ORAL_TABLET | Freq: Once | ORAL | Status: AC
  Administered 2014-02-16: 5 mg via ORAL
  Filled 2014-02-16: qty 1

## 2014-02-16 NOTE — Progress Notes (Signed)
CARDIAC REHAB PHASE I   PRE:  Rate/Rhythm: 66  BP:  Sitting: 112/63     SaO2: 100% RA  MODE:  Ambulation: 700 ft   POST:  Rate/Rhythm: 79  BP:  Sitting: 111/52     SaO2: 96% RA  9:30am-9:55am  Patient tolerated walk well. Patient talked the entire time of ambulation.  Patient had no complaints of shortness of breath, dizziness, or pain. He stated that he had already gone for a walk by himself this am. Raymond Pratt was placed in his chair.   Hal Morales, MS 02/16/2014 9:51 AM

## 2014-02-16 NOTE — Progress Notes (Signed)
Advanced Heart Failure Rounding Note   Subjective:    Raymond Pratt is a 62 y.o. male with positive PMH of chronic combined systolic and diastolic CHF ([id number]) Echo: EF 20-25%, A-fib w/ RVR (2010 s/p failed cardioversion), DM, HTN and OSA on CPAP.   Admitted to [facility] 10/23 through 01/08/14 with volume overload and low output. He was diuresed with IV lasix and milrinone. He ultimately required home milrinone 0.125 mcg. Discharge weight was 260 pounds. [facility] followed once discharged.   On 11/19 pulled PICC out and replaced by IR. Says he flushed the catheter on 12/1 and 1 hour later had chills. On 12/3 flushed catheter again and had chills. No problems since then. HR was running in the 110-140s. 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 Underwent TEE-DC-CV (12/10). Remains in NSR, on amio. Weight slightly up from yesterday about one and half lbs. Denies SOB, orthopnea or CP. Cultures repeated and 1/2 growing GPC (most likely contaminant per ID). Off milrinone yesterday.   INR 2.80   Objective:   Weight Range:  Vital Signs:   Temp:  [97.6 F (36.4 C)-98.1 F (36.7 C)] 98.1 F (36.7 C) (12/12 0756) Pulse Rate:  [69-70] 69 (12/12 0338) Resp:  [18-20] 18 (12/12 0756) BP: (97-111)/(52-71) 105/71 mmHg (12/12 0756) SpO2:  [96 %-99 %] 97 % (12/12 0756) Weight:  [124 kg (273 lb 5.9 oz)] 124 kg (273 lb 5.9 oz) (12/12 0338) Last BM Date: 02/16/14  Weight change: Filed Weights   02/14/14 0500 02/15/14 0702 02/16/14 0338  Weight: 123.832 kg (273 lb) 123.787 kg (272 lb 14.4 oz) 124 kg (273 lb 5.9 oz)    Intake/Output:   Intake/Output Summary (Last 24 hours) at 02/16/14 1047 Last data filed at 02/16/14 1029  Gross per 24 hour  Intake 1218.67 ml  Output   1000 ml  Net 218.67 ml     Physical Exam: General: Chronically ill appearing. No resp difficulty. Sitting on the side of the bed.  HEENT: normal Neck:  supple. JVP 7-8. Carotids 2+ bilat; no bruits. No lymphadenopathy or thryomegaly appreciated. Cor: PMI nondisplaced. NSR. 2/6 SEM  Lungs: clear Abdomen: obese, soft, nontender, mildly distended. No hepatosplenomegaly. No bruits or masses. Good bowel sounds. Extremities: no cyanosis, clubbing, rash, trace bilateral edema Neuro: alert & orientedx3, cranial nerves grossly intact. moves all 4 extremities w/o difficulty. Affect pleasant  Telemetry: NSR 65-75 Labs: Basic Metabolic Panel:  Recent Labs Lab 02/12/14 2100 02/13/14 0214 02/14/14 0244 02/15/14 0339 02/16/14 0303  NA 138 136* 137 136* 137  K 4.0 3.7 3.8 4.3 4.6  CL 93* 94* 93* 93* 95*  CO2 34* 26 32 30 30  GLUCOSE 126* 119* 128* 159* 144*  BUN 23 23 29* 27* 31*  CREATININE 1.22 1.14 1.12 1.13 1.21  CALCIUM 9.3 9.0 9.3 9.8 9.8    Liver Function Tests:  Recent Labs Lab 02/12/14 2100 02/13/14 0214  AST 28 26  ALT 26 24  ALKPHOS 97 88  BILITOT 0.8 0.8  PROT 7.9 7.7  ALBUMIN 3.4* 3.1*   No results for input(s): LIPASE, AMYLASE in the last 168 hours. No results for input(s): AMMONIA in the last 168 hours.  CBC:  Recent Labs Lab 02/11/14 1601 02/12/14 2100 02/13/14 0214 02/14/14 0244 02/15/14 0339  WBC 12.6* 8.1 7.8 8.7 8.4  HGB 13.1 13.1 13.3 13.2 13.2  HCT 40.8 40.3 41.0 40.8 40.7  MCV 94.2 94.2 94.5  94.0 94.0  PLT 293 294 298 321 320    Cardiac Enzymes: No results for input(s): CKTOTAL, CKMB, CKMBINDEX, TROPONINI in the last 168 hours.  BNP: BNP (last 3 results)  Recent Labs  12/28/13 1210 01/01/14 0415  PROBNP 2624.0* 1882.0*     Other results:    Imaging: No results found.   Medications:     Scheduled Medications: . allopurinol  300 mg Oral Daily  . amiodarone  400 mg Oral BID  . aspirin  81 mg Oral Daily  . cefTRIAXone (ROCEPHIN)  IV  2 g Intravenous Q24H  . Chlorhexidine Gluconate Cloth  6 each Topical Q0600  . digoxin  0.125 mg Oral Daily  . insulin aspart  0-15 Units  Subcutaneous TID WC  . insulin aspart  0-5 Units Subcutaneous QHS  . insulin aspart  3 Units Subcutaneous TID WC  . insulin glargine  30 Units Subcutaneous QHS  . losartan  12.5 mg Oral Daily  . metoprolol succinate  25 mg Oral BID  . mupirocin ointment  1 application Nasal BID  . potassium chloride SA  20 mEq Oral BID  . simvastatin  20 mg Oral q1800  . sodium chloride  3 mL Intravenous Q12H  . spironolactone  25 mg Oral Daily  . torsemide  60 mg Oral BID  . vancomycin  1,250 mg Intravenous Q12H  . Warfarin - Pharmacist Dosing Inpatient   Does not apply q1800    Infusions: . sodium chloride Stopped (02/16/14 0200)  . sodium chloride 20 mL/hr at 02/14/14 2202  . sodium chloride 250 mL (02/13/14 2133)    PRN Medications: ALPRAZolam, ondansetron, sodium chloride   Assessment:    1) Bacteremia - GN rods from blood cx - GPC 1/2, coag negative staph  2) Chronic combined systolic/diastolic HF 3) Atrial fibrillation, converted to NSR s/p DCCV on 12/10  4) HTN 5) OSA   Plan/Discussion:    3 days of Cefetaz, switched to Rocephin yesterday, currently on day 2.PICC out.  ID consulted. He is also on vancomycin, empirically for repeat cultures 1/2 showed coag neg staph (likely contaminate).  TEE-DC-CV 12/10 and remains in NSR. Continue amiodarone 400 mg BID for 1 week and then would decrease to 200 mg BID. Heparin stopped and INR now therapeutic (2.98).   Off Milrinone on 12/11.  Weight slightly up today about 1.5 lbs, I/O + 631.  May need repeat PICC/CL for Milrinone/IV ABx (2 weeks for ID).     Length of Stay: 4 Kennith Maes  02/16/2014, 10:42 AM  Advanced Heart Failure Team Pager [phone] (M-F; [place])  Please contact [facility] for night-coverage after hours (4p -7a ) and weekends on [email or web address]  Patient seen and examined with Dr. Awanda Mink. We discussed all aspects of the encounter. I agree with the assessment and plan as stated above.   Looks good ambulating hall.  Maintaining SR. Weight up off milrinone. Surveillance bcx 1/2 CNS (contaminant). Will wait one more day and plan to replace PICC tomorrow if [facility] remain negative. Can check co-ox when PICC in and make decision about restarting mirlinone. Will need 2 weeks IV abx.   Kymoni Monday,MD 12:21 PM

## 2014-02-16 NOTE — Progress Notes (Signed)
[place] for Heparin>>warfarin Indication: atrial fibrillation  No Known Allergies  Patient Measurements: Height: 5\' 7"  (170.2 cm) Weight: 273 lb 5.9 oz (124 kg) IBW/kg (Calculated) : 66.1 Heparin Dosing Weight: 99 kg  Vital Signs: Temp: 97.9 F (36.6 C) (12/12 1149) Temp Source: Oral (12/12 1149) BP: 107/59 mmHg (12/12 1149) Pulse Rate: 69 (12/12 0338)  Labs:  Recent Labs  02/14/14 0244 02/14/14 1645 02/15/14 0339 02/16/14 0303  HGB 13.2  --  13.2  --   HCT 40.8  --  40.7  --   PLT 321  --  320  --   LABPROT 22.4*  --  27.5* 29.7*  INR 1.95*  --  2.54* 2.80*  HEPARINUNFRC 0.26* 0.37 0.67  --   CREATININE 1.12  --  1.13 1.21    Estimated Creatinine Clearance: 80 mL/min (by C-G formula based on Cr of 1.21).   Medications:  Scheduled:  . allopurinol  300 mg Oral Daily  . amiodarone  400 mg Oral BID  . aspirin  81 mg Oral Daily  . cefTRIAXone (ROCEPHIN)  IV  2 g Intravenous Q24H  . Chlorhexidine Gluconate Cloth  6 each Topical Q0600  . digoxin  0.125 mg Oral Daily  . insulin aspart  0-15 Units Subcutaneous TID WC  . insulin aspart  0-5 Units Subcutaneous QHS  . insulin aspart  3 Units Subcutaneous TID WC  . insulin glargine  30 Units Subcutaneous QHS  . losartan  12.5 mg Oral Daily  . metoprolol succinate  25 mg Oral BID  . mupirocin ointment  1 application Nasal BID  . [START ON 02/17/2014] potassium chloride SA  20 mEq Oral Daily  . simvastatin  20 mg Oral q1800  . sodium chloride  3 mL Intravenous Q12H  . spironolactone  25 mg Oral Daily  . torsemide  60 mg Oral BID  . vancomycin  1,250 mg Intravenous Q12H  . warfarin  5 mg Oral ONCE-1800  . Warfarin - Pharmacist Dosing Inpatient   Does not apply q1800   Infusions:  . sodium chloride Stopped (02/16/14 0200)  . sodium chloride 20 mL/hr at 02/14/14 2202  . sodium chloride 250 mL (02/13/14 2133)    Assessment: 63 yo M on heparin/warfarin therapy for hx afib.  Heparin stopped with INR > 2.   Cardioverted to NSR this admit and has maintained.  INR 2.8 no bleeding noted, cbc stable **Noted plans to discharge on bactrim at d/c, will likely need to decrease warfarin dosing and follow closely as outpatient, unless ID has different recs on discharge abx  Bacteremia - GNR (agglomerans) - pan sensitive, complicated by GPC in 1/2 bld cxs and started on vanc last night, ceftaz changed to ceftriaxone by ID 12/11.  Afebrile, wbc 8, scr stable 1.2.  Goal of Therapy:  INR 2-3 Vancomycin trough 15-20 Heparin level 0.3-0.7 units/ml Monitor platelets by anticoagulation protocol: Yes   Plan:  Warfarin 5mg  tonight Continue vancomycin and ceftrixaone  Follow up cx data for abx changes   Bonnita Nasuti Pharm.D. CPP, BCPS Clinical Pharmacist [phone] 02/16/2014 2:47 PM

## 2014-02-16 NOTE — Progress Notes (Signed)
Subjective: Feels tired and is frustrated with being awoken early in the am, no dyspnea at rest  Objective: Vital signs in last 24 hours: Temp:  [97.6 F (36.4 C)-98.2 F (36.8 C)] 97.8 F (36.6 C) (12/12 0338) Pulse Rate:  [69-70] 69 (12/12 0338) Resp:  [19-20] 20 (12/12 0338) BP: (97-114)/(52-98) 105/71 mmHg (12/12 0338) SpO2:  [96 %-99 %] 98 % (12/12 0338) Weight:  [124 kg (273 lb 5.9 oz)] 124 kg (273 lb 5.9 oz) (12/12 0338) Weight change:    Intake/Output from previous day: 12/11 0701 - 12/12 0700 In: 1431.9 [P.O.:790; I.V.:141.9; IV Piggyback:500] Out: 800 [Urine:800]   General appearance: alert, cooperative and no distress Resp: clear to auscultation bilaterally Cardio: regular rate and rhythm and with a 2/6 SEM Extremities: bilateral 1+ leg edema  Lab Results:  Recent Labs  02/14/14 0244 02/15/14 0339  WBC 8.7 8.4  HGB 13.2 13.2  HCT 40.8 40.7  PLT 321 320   BMET  Recent Labs  02/15/14 0339 02/16/14 0303  NA 136* 137  K 4.3 4.6  CL 93* 95*  CO2 30 30  GLUCOSE 159* 144*  BUN 27* 31*  CREATININE 1.13 1.21  CALCIUM 9.8 9.8   CMET CMP     Component Value Date/Time   NA 137 02/16/2014 0303   K 4.6 02/16/2014 0303   CL 95* 02/16/2014 0303   CO2 30 02/16/2014 0303   GLUCOSE 144* 02/16/2014 0303   BUN 31* 02/16/2014 0303   CREATININE 1.21 02/16/2014 0303   CALCIUM 9.8 02/16/2014 0303   PROT 7.7 02/13/2014 0214   ALBUMIN 3.1* 02/13/2014 0214   AST 26 02/13/2014 0214   ALT 24 02/13/2014 0214   ALKPHOS 88 02/13/2014 0214   BILITOT 0.8 02/13/2014 0214   GFRNONAA 62* 02/16/2014 0303   GFRAA 72* 02/16/2014 0303    CBG (last 3)   Recent Labs  02/15/14 1150 02/15/14 1718 02/15/14 2204  GLUCAP 114* 104* 106*    INR RESULTS:   Lab Results  Component Value Date   INR 2.80* 02/16/2014   INR 2.54* 02/15/2014   INR 1.95* 02/14/2014     Studies/Results: No results found.  Medications: I have reviewed the patient's current  medications.  Assessment/Plan: #1 Bacteremia: stable on antibiotics and awaiting final blood culture results with GPC in clusters. He will need a central line prior to discharge. Anticipate central line replacement and discharge on Monday #2 Atrial fibrillation: stable and he remains in a sinus rhythm #3 DM2: stable on insulin  LOS: 4 days   Raymond Pratt G 02/16/2014, 7:52 AM

## 2014-02-17 LAB — BASIC METABOLIC PANEL
ANION GAP: 15 (ref 5–15)
BUN: 33 mg/dL — AB (ref 6–23)
CALCIUM: 10.1 mg/dL (ref 8.4–10.5)
CO2: 29 mEq/L (ref 19–32)
CREATININE: 1.14 mg/dL (ref 0.50–1.35)
Chloride: 93 mEq/L — ABNORMAL LOW (ref 96–112)
GFR calc non Af Amer: 67 mL/min — ABNORMAL LOW (ref >90)
GFR, EST AFRICAN AMERICAN: 78 mL/min — AB (ref >90)
Glucose, Bld: 104 mg/dL — ABNORMAL HIGH (ref 70–99)
Potassium: 4 mEq/L (ref 3.7–5.3)
Sodium: 137 mEq/L (ref 137–147)

## 2014-02-17 LAB — PROTIME-INR
INR: 3 — AB (ref 0.00–1.49)
PROTHROMBIN TIME: 31.4 s — AB (ref 11.6–15.2)

## 2014-02-17 LAB — GLUCOSE, CAPILLARY
GLUCOSE-CAPILLARY: 94 mg/dL (ref 70–99)
GLUCOSE-CAPILLARY: 113 mg/dL — AB (ref 70–99)
GLUCOSE-CAPILLARY: 137 mg/dL — AB (ref 70–99)
Glucose-Capillary: 129 mg/dL — ABNORMAL HIGH (ref 70–99)

## 2014-02-17 NOTE — Progress Notes (Addendum)
Advanced Heart Failure Rounding Note   Subjective:    Raymond Pratt is a 63 y.o. male with positive PMH of chronic combined systolic and diastolic CHF ([id number]) Echo: EF 20-25%, A-fib w/ RVR (2010 s/p failed cardioversion), DM, HTN and OSA on CPAP.   Admitted to [facility] 10/23 through 01/08/14 with volume overload and low output. He was diuresed with IV lasix and milrinone. He ultimately required home milrinone 0.125 mcg. Discharge weight was 260 pounds. [facility] followed once discharged.   On 11/19 pulled PICC out and replaced by IR. Says he flushed the catheter on 12/1 and 1 hour later had chills. On 12/3 flushed catheter again and had chills. No problems since then. HR was running in the 110-140s. 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 Underwent TEE-DC-CV (12/10). Remains in NSR, on amio. Weight down. Ambulating halls. Denies SOB, orthopnea or CP. Cultures repeated and 1/2 growing GPC  (likely contaminant). Off milrinone       Objective:   Weight Range:  Vital Signs:   Temp:  [97.1 F (36.2 C)-98.4 F (36.9 C)] 98.4 F (36.9 C) (12/13 0852) Pulse Rate:  [64] 64 (12/12 2217) Resp:  [16-20] 16 (12/13 0340) BP: (107-118)/(59-88) 108/88 mmHg (12/13 0852) SpO2:  [92 %-98 %] 98 % (12/13 0852) Weight:  [122.3 kg (269 lb 10 oz)] 122.3 kg (269 lb 10 oz) (12/13 0340) Last BM Date: 02/17/14  Weight change: Filed Weights   02/15/14 0702 02/16/14 0338 02/17/14 0340  Weight: 123.787 kg (272 lb 14.4 oz) 124 kg (273 lb 5.9 oz) 122.3 kg (269 lb 10 oz)    Intake/Output:   Intake/Output Summary (Last 24 hours) at 02/17/14 1132 Last data filed at 02/17/14 1000  Gross per 24 hour  Intake   1749 ml  Output   3650 ml  Net  -1901 ml     Physical Exam: General: Chronically ill appearing. No resp difficulty. Sitting on the side of the bed.  HEENT: normal Neck: supple. JVP 7-8. Carotids 2+ bilat; no bruits. No  lymphadenopathy or thryomegaly appreciated. Cor: PMI nondisplaced. NSR. 2/6 SEM  Lungs: clear Abdomen: obese, soft, nontender, mildly distended. No hepatosplenomegaly. No bruits or masses. Good bowel sounds. Extremities: no cyanosis, clubbing, rash, trace bilateral edema Neuro: alert & orientedx3, cranial nerves grossly intact. moves all 4 extremities w/o difficulty. Affect pleasant  Telemetry: NSR 65-75 Labs: Basic Metabolic Panel:  Recent Labs Lab 02/13/14 0214 02/14/14 0244 02/15/14 0339 02/16/14 0303 02/17/14 0258  NA 136* 137 136* 137 137  K 3.7 3.8 4.3 4.6 4.0  CL 94* 93* 93* 95* 93*  CO2 26 32 30 30 29   GLUCOSE 119* 128* 159* 144* 104*  BUN 23 29* 27* 31* 33*  CREATININE 1.14 1.12 1.13 1.21 1.14  CALCIUM 9.0 9.3 9.8 9.8 10.1    Liver Function Tests:  Recent Labs Lab 02/12/14 2100 02/13/14 0214  AST 28 26  ALT 26 24  ALKPHOS 97 88  BILITOT 0.8 0.8  PROT 7.9 7.7  ALBUMIN 3.4* 3.1*   No results for input(s): LIPASE, AMYLASE in the last 168 hours. No results for input(s): AMMONIA in the last 168 hours.  CBC:  Recent Labs Lab 02/11/14 1601 02/12/14 2100 02/13/14 0214 02/14/14 0244 02/15/14 0339  WBC 12.6* 8.1 7.8 8.7 8.4  HGB 13.1 13.1 13.3 13.2 13.2  HCT 40.8 40.3 41.0 40.8 40.7  MCV 94.2 94.2 94.5 94.0 94.0  PLT  293 294 298 321 320    Cardiac Enzymes: No results for input(s): CKTOTAL, CKMB, CKMBINDEX, TROPONINI in the last 168 hours.  BNP: BNP (last 3 results)  Recent Labs  12/28/13 1210 01/01/14 0415  PROBNP 2624.0* 1882.0*     Other results:    Imaging: No results found.   Medications:     Scheduled Medications: . allopurinol  300 mg Oral Daily  . amiodarone  400 mg Oral BID  . aspirin  81 mg Oral Daily  . cefTRIAXone (ROCEPHIN)  IV  2 g Intravenous Q24H  . digoxin  0.125 mg Oral Daily  . insulin aspart  0-15 Units Subcutaneous TID WC  . insulin aspart  0-5 Units Subcutaneous QHS  . insulin aspart  3 Units  Subcutaneous TID WC  . insulin glargine  30 Units Subcutaneous QHS  . losartan  12.5 mg Oral Daily  . metoprolol succinate  25 mg Oral BID  . potassium chloride SA  20 mEq Oral Daily  . simvastatin  20 mg Oral q1800  . spironolactone  25 mg Oral Daily  . torsemide  60 mg Oral BID  . Warfarin - Pharmacist Dosing Inpatient   Does not apply q1800    Infusions: . sodium chloride Stopped (02/16/14 0200)    PRN Medications: ALPRAZolam, ondansetron   Assessment:    1) Bacteremia - GN rods from blood cx - GPC 1/2, coag negative staph  2) Chronic combined systolic/diastolic HF 3) Atrial fibrillation, converted to NSR s/p DCCV on 12/10  4) HTN 5) OSA   Plan/Discussion:    Looks good ambulating hall. Maintaining SR on amio. Stable off milrinone. Surveillance bcx 1/2 CNS (contaminant). Will have IR place power PICC in IJ tomorrow (he has not done well with PICC). Can check co-ox when PICC in and make decision about restarting mirlinone. Will need 2 weeks IV abx. Hopefully home in am.     Length of Stay: [place]  02/17/2014, 11:32 AM Advanced Heart Failure Team Pager [phone] (M-F; 7a - 4p)  Please contact [facility] for night-coverage after hours (4p -7a ) and weekends on [email or web address]

## 2014-02-17 NOTE — Progress Notes (Signed)
Pt has refused cpap at this time.  Rt will continue to monitor.

## 2014-02-17 NOTE — Progress Notes (Signed)
Subjective: Tired, no dyspnea or pain  Objective: Vital signs in last 24 hours: Temp:  [97.1 F (36.2 C)-98.4 F (36.9 C)] 98.4 F (36.9 C) (12/13 0852) Pulse Rate:  [64] 64 (12/12 2217) Resp:  [16-20] 16 (12/13 0340) BP: (107-118)/(59-88) 108/88 mmHg (12/13 0852) SpO2:  [92 %-98 %] 98 % (12/13 0852) Weight:  [122.3 kg (269 lb 10 oz)] 122.3 kg (269 lb 10 oz) (12/13 0340) Weight change: -1.487 kg (-3 lb 4.4 oz)   Intake/Output from previous day: 12/12 0701 - 12/13 0700 In: 1989 [P.O.:1486; I.V.:3; IV Piggyback:500] Out: 3950 [Urine:3950]   General appearance: alert, cooperative and no distress Resp: clear to auscultation bilaterally Cardio: regular rate and rhythm GI: soft, non-tender; bowel sounds normal; no masses,  no organomegaly Extremities: bilateral trace leg edema  Lab Results:  Recent Labs  02/15/14 0339  WBC 8.4  HGB 13.2  HCT 40.7  PLT 320   BMET  Recent Labs  02/16/14 0303 02/17/14 0258  NA 137 137  K 4.6 4.0  CL 95* 93*  CO2 30 29  GLUCOSE 144* 104*  BUN 31* 33*  CREATININE 1.21 1.14  CALCIUM 9.8 10.1   CMET CMP     Component Value Date/Time   NA 137 02/17/2014 0258   K 4.0 02/17/2014 0258   CL 93* 02/17/2014 0258   CO2 29 02/17/2014 0258   GLUCOSE 104* 02/17/2014 0258   BUN 33* 02/17/2014 0258   CREATININE 1.14 02/17/2014 0258   CALCIUM 10.1 02/17/2014 0258   PROT 7.7 02/13/2014 0214   ALBUMIN 3.1* 02/13/2014 0214   AST 26 02/13/2014 0214   ALT 24 02/13/2014 0214   ALKPHOS 88 02/13/2014 0214   BILITOT 0.8 02/13/2014 0214   GFRNONAA 67* 02/17/2014 0258   GFRAA 78* 02/17/2014 0258    CBG (last 3)   Recent Labs  02/16/14 1136 02/16/14 1604 02/16/14 2020  GLUCAP 94 127* 124*    INR RESULTS:   Lab Results  Component Value Date   INR 3.00* 02/17/2014   INR 2.80* 02/16/2014   INR 2.54* 02/15/2014     Studies/Results: No results found.  Medications: I have reviewed the patient's current  medications.  Assessment/Plan: #1 Gram negative bacteremia: stable and will request PICC line, then home on IV rocephin #2 CHF: stable and decision on restarting milrinone pending #3 Paroxysmal Atrial fibrillation: stable and remains in a sinus rhythm  LOS: 5 days   Meagan Spease G 02/17/2014, 9:59 AM

## 2014-02-18 ENCOUNTER — Inpatient Hospital Stay (HOSPITAL_COMMUNITY): Payer: BC Managed Care – PPO

## 2014-02-18 LAB — BASIC METABOLIC PANEL
Anion gap: 15 (ref 5–15)
BUN: 38 mg/dL — ABNORMAL HIGH (ref 6–23)
CO2: 26 mEq/L (ref 19–32)
Calcium: 9.7 mg/dL (ref 8.4–10.5)
Chloride: 91 mEq/L — ABNORMAL LOW (ref 96–112)
Creatinine, Ser: 1.29 mg/dL (ref 0.50–1.35)
GFR calc Af Amer: 67 mL/min — ABNORMAL LOW (ref >90)
GFR calc non Af Amer: 58 mL/min — ABNORMAL LOW (ref >90)
Glucose, Bld: 110 mg/dL — ABNORMAL HIGH (ref 70–99)
Potassium: 4 mEq/L (ref 3.7–5.3)
Sodium: 132 mEq/L — ABNORMAL LOW (ref 137–147)

## 2014-02-18 LAB — GLUCOSE, CAPILLARY
Glucose-Capillary: 108 mg/dL — ABNORMAL HIGH (ref 70–99)
Glucose-Capillary: 85 mg/dL (ref 70–99)
Glucose-Capillary: 139 mg/dL — ABNORMAL HIGH (ref 70–99)

## 2014-02-18 LAB — CARBOXYHEMOGLOBIN
Carboxyhemoglobin: 1.3 % (ref 0.5–1.5)
Methemoglobin: 0.8 % (ref 0.0–1.5)
O2 SAT: 62.4 %
Total hemoglobin: 15.9 g/dL (ref 13.5–18.0)

## 2014-02-18 LAB — PROTIME-INR
INR: 3.15 — ABNORMAL HIGH (ref 0.00–1.49)
Prothrombin Time: 32.6 seconds — ABNORMAL HIGH (ref 11.6–15.2)

## 2014-02-18 MED ORDER — LIDOCAINE-EPINEPHRINE (PF) 1 %-1:200000 IJ SOLN
INTRAMUSCULAR | Status: AC
  Filled 2014-02-18: qty 10

## 2014-02-18 MED ORDER — CEFTRIAXONE SODIUM IN DEXTROSE 40 MG/ML IV SOLN: 2.0000 g | INTRAVENOUS | Status: DC

## 2014-02-18 MED ORDER — CEFTRIAXONE SODIUM IN DEXTROSE 40 MG/ML IV SOLN
2.0000 g | INTRAVENOUS | Status: DC
  Administered 2014-02-18: 2 g via INTRAVENOUS
  Filled 2014-02-18: qty 50

## 2014-02-18 MED ORDER — AMIODARONE HCL 200 MG PO TABS: ORAL_TABLET | ORAL | Status: DC

## 2014-02-18 MED ORDER — WARFARIN SODIUM 5 MG PO TABS: ORAL_TABLET | ORAL | Status: DC

## 2014-02-18 MED ORDER — POTASSIUM CHLORIDE CRYS ER 20 MEQ PO TBCR: 20.0000 meq | EXTENDED_RELEASE_TABLET | Freq: Every day | ORAL | Status: DC

## 2014-02-18 NOTE — Progress Notes (Signed)
I have done MAR and D/c summary.  Discussed case c HF team. I just did not sign the D/c summary yet to see what transpires with PICC/ IV Abx and Co-OX today Milrinone is on D/c med list and will pull it off if he continues to not need it. He looks good and will most likely be d/ced later today on Once a day IV Rocephin through Monday 12/21.

## 2014-02-18 NOTE — Progress Notes (Signed)
02/18/2014 5:33 PM PIV removed. Instructions for care of PICC given. Discharge instructions given and prescriptions. Verbalized understanding. Taken to car with all belonging. Charnika Herbst, Carolynn Comment

## 2014-02-18 NOTE — Progress Notes (Signed)
[place] for Heparin>>warfarin Indication: atrial fibrillation  No Known Allergies  Patient Measurements: Height: 5\' 7"  (170.2 cm) Weight: 268 lb 6.4 oz (121.745 kg) IBW/kg (Calculated) : 66.1 Heparin Dosing Weight: 99 kg  Vital Signs: Temp: 97.1 F (36.2 C) (12/14 1229) Temp Source: Oral (12/14 1229) BP: 119/71 mmHg (12/14 1229) Pulse Rate: 69 (12/14 0300)  Labs:  Recent Labs  02/16/14 0303 02/17/14 0258 02/18/14 0300  LABPROT 29.7* 31.4* 32.6*  INR 2.80* 3.00* 3.15*  CREATININE 1.21 1.14 1.29    Estimated Creatinine Clearance: 74.2 mL/min (by C-G formula based on Cr of 1.29).   Medications:  Scheduled:  . allopurinol  300 mg Oral Daily  . amiodarone  400 mg Oral BID  . aspirin  81 mg Oral Daily  . cefTRIAXone (ROCEPHIN)  IV  2 g Intravenous Q24H  . digoxin  0.125 mg Oral Daily  . insulin aspart  0-15 Units Subcutaneous TID WC  . insulin aspart  0-5 Units Subcutaneous QHS  . insulin aspart  3 Units Subcutaneous TID WC  . insulin glargine  30 Units Subcutaneous QHS  . losartan  12.5 mg Oral Daily  . metoprolol succinate  25 mg Oral BID  . potassium chloride SA  20 mEq Oral Daily  . simvastatin  20 mg Oral q1800  . spironolactone  25 mg Oral Daily  . torsemide  60 mg Oral BID  . Warfarin - Pharmacist Dosing Inpatient   Does not apply q1800   Infusions:  . sodium chloride Stopped (02/16/14 0200)    Assessment: 63 yo M on heparin/warfarin therapy for hx afib. Heparin stopped with INR > 2.   Cardioverted to NSR this admit and has maintained.   Coag: Afib:  INR trend up, possibly due to antibiotics PTA Warfarin 7.5 mg daily, except 10 mg wed & fri  ID ; Bacteremia - GNR (agglomerans) - pan sensitive, ceftriaxone  Per ID 2 wks of tx  CTX>> 12/11 Afebrile, wbc 8, scr stable, CrCl ~ 75 ml/min  Goal of Therapy:  INR 2-3 Monitor platelets by anticoagulation protocol: Yes   Plan:  No warfarin today, to plan 7.5 mg  daily starting 12/15 and follow up INR next week at Dr. Odessa Fleming office  ceftrixaone  Follow up cx data for abx changes  Thank you for allowing pharmacy to be a part of this patients care team.  Rowe Robert Pharm.D., BCPS, AQ-Cardiology Clinical Pharmacist 02/18/2014 1:54 PM Pager: [phone] Phone: [phone]

## 2014-02-18 NOTE — Progress Notes (Signed)
Pt is walking independently this am, 4 laps (1400 ft). No needs as he sts he knows what hes not supposed to eat well and that he weighs daily.  Yves Dill CES, ACSM 11:43 AM 02/18/2014

## 2014-02-18 NOTE — Discharge Instructions (Signed)

## 2014-02-18 NOTE — Progress Notes (Signed)
INFECTIOUS DISEASE PROGRESS NOTE  ID: Raymond Pratt is a 63 y.o. male with  Principal Problem:   Gram-negative bacteremia Active Problems:   Type 2 diabetes mellitus with vascular disease   ATRIAL FIBRILLATION WITH RAPID VENTRICULAR RESPONSE   Acute on chronic combined systolic and diastolic heart failure, NYHA class 4   Obstructive sleep apnea on CPAP   Warfarin anticoagulation   Chronic combined systolic and diastolic heart failure   HTN (hypertension)   A-fib  Subjective: Without complaints  Abtx:  Anti-infectives    Start     Dose/Rate Route Frequency Ordered Stop   02/18/14 1400  cefTRIAXone (ROCEPHIN) 2 g in dextrose 5 % 50 mL IVPB - Premix     2 g100 mL/hr over 30 Minutes Intravenous Every 24 hours 02/18/14 0819     02/18/14 0000  cefTRIAXone (ROCEPHIN) 40 MG/ML IVPB     2 g100 mL/hr over 30 Minutes Intravenous Every 24 hours 02/18/14 0727     02/15/14 1600  cefTRIAXone (ROCEPHIN) 2 g in dextrose 5 % 50 mL IVPB - Premix  Status:  Discontinued     2 g100 mL/hr over 30 Minutes Intravenous Every 24 hours 02/15/14 1512 02/18/14 0819   02/15/14 1100  vancomycin (VANCOCIN) 1,250 mg in sodium chloride 0.9 % 250 mL IVPB  Status:  Discontinued     1,250 mg166.7 mL/hr over 90 Minutes Intravenous Every 12 hours 02/14/14 2118 02/17/14 1005   02/15/14 0000  sulfamethoxazole-trimethoprim (BACTRIM DS,SEPTRA DS) 800-160 MG per tablet  Status:  Discontinued     1 tablet Oral 2 times daily 02/15/14 0810 02/18/14    02/14/14 2200  vancomycin (VANCOCIN) 1,250 mg in sodium chloride 0.9 % 250 mL IVPB     1,250 mg166.7 mL/hr over 90 Minutes Intravenous  Once 02/14/14 2118 02/14/14 2313   02/12/14 2200  cefTAZidime (FORTAZ) 2 g in dextrose 5 % 50 mL IVPB  Status:  Discontinued     2 g100 mL/hr over 30 Minutes Intravenous 3 times per day 02/12/14 2125 02/15/14 1512      Medications:  Scheduled: . allopurinol  300 mg Oral Daily  . amiodarone  400 mg Oral BID  . aspirin  81 mg Oral Daily   . cefTRIAXone (ROCEPHIN)  IV  2 g Intravenous Q24H  . digoxin  0.125 mg Oral Daily  . insulin aspart  0-15 Units Subcutaneous TID WC  . insulin aspart  0-5 Units Subcutaneous QHS  . insulin aspart  3 Units Subcutaneous TID WC  . insulin glargine  30 Units Subcutaneous QHS  . losartan  12.5 mg Oral Daily  . metoprolol succinate  25 mg Oral BID  . potassium chloride SA  20 mEq Oral Daily  . simvastatin  20 mg Oral q1800  . spironolactone  25 mg Oral Daily  . torsemide  60 mg Oral BID  . Warfarin - Pharmacist Dosing Inpatient   Does not apply q1800    Objective: Vital signs in last 24 hours: Temp:  [97.2 F (36.2 C)-98.3 F (36.8 C)] 97.2 F (36.2 C) (12/14 0810) Pulse Rate:  [63-69] 69 (12/14 0300) Resp:  [17-117] 18 (12/14 0810) BP: (72-123)/(51-80) 115/61 mmHg (12/14 0905) SpO2:  [94 %-97 %] 95 % (12/14 0810) Weight:  [121.745 kg (268 lb 6.4 oz)] 121.745 kg (268 lb 6.4 oz) (12/14 0300)   General appearance: alert, cooperative and no distress Resp: clear to auscultation bilaterally Cardio: regular rate and rhythm GI: normal findings: bowel sounds normal and soft, non-tender  Extremities: edema 2-3+  Lab Results  Recent Labs  02/17/14 0258 02/18/14 0300  NA 137 132*  K 4.0 4.0  CL 93* 91*  CO2 29 26  BUN 33* 38*  CREATININE 1.14 1.29   Liver Panel No results for input(s): PROT, ALBUMIN, AST, ALT, ALKPHOS, BILITOT, BILIDIR, IBILI in the last 72 hours. Sedimentation Rate No results for input(s): ESRSEDRATE in the last 72 hours. C-Reactive Protein No results for input(s): CRP in the last 72 hours.  Microbiology: Recent Results (from the past 240 hour(s))  Blood culture (routine single)     Status: None   Collection Time: 02/11/14  4:35 PM  Result Value Ref Range Status   Specimen Description BLOOD  Final   Special Requests BOTTLES DRAWN AEROBIC AND ANAEROBIC 10CC PICC  Final   Culture  Setup Time   Final    02/12/2014 01:09 Performed at [facility]    Culture   Final    PANTOEA AGGLOMERANS Note: Gram Stain Report Called to,Read Back By and Verified With: DR RUSSO@3 :11PM ON 02/12/14 BY DANTS REPORT FAXED BY REQUEST Performed at [facility]    Report Status 02/14/2014 FINAL  Final   Organism ID, Bacteria PANTOEA AGGLOMERANS  Final      Susceptibility   Pantoea agglomerans - MIC*    CEFAZOLIN <=4 SENSITIVE Sensitive     CEFEPIME <=1 SENSITIVE Sensitive     CEFTAZIDIME <=1 SENSITIVE Sensitive     CEFTRIAXONE <=1 SENSITIVE Sensitive     CIPROFLOXACIN <=0.25 SENSITIVE Sensitive     GENTAMICIN <=1 SENSITIVE Sensitive     IMIPENEM <=0.25 SENSITIVE Sensitive     PIP/TAZO 64 INTERMEDIATE Intermediate     TOBRAMYCIN <=1 SENSITIVE Sensitive     TRIMETH/SULFA <=20 SENSITIVE Sensitive     * PANTOEA AGGLOMERANS  MRSA PCR Screening     Status: Abnormal   Collection Time: 02/12/14  7:04 PM  Result Value Ref Range Status   MRSA by PCR POSITIVE (A) NEGATIVE Final    Comment:        The GeneXpert MRSA Assay (FDA approved for NASAL specimens only), is one component of a comprehensive MRSA colonization surveillance program. It is not intended to diagnose MRSA infection nor to guide or monitor treatment for MRSA infections. RESULT CALLED TO, READ BACK BY AND VERIFIED WITH: ALLEN,Z RN [id number] 02/12/14 MITCHELL,L   Cath Tip Culture     Status: None   Collection Time: 02/12/14 11:26 PM  Result Value Ref Range Status   Specimen Description CATH TIP  Final   Special Requests NONE  Final   Culture   Final    15 COLONIES PANTOEA AGGLOMERANS Performed at [facility]    Report Status 02/16/2014 FINAL  Final   Organism ID, Bacteria PANTOEA AGGLOMERANS  Final      Susceptibility   Pantoea agglomerans - MIC*    CEFAZOLIN <=4 SENSITIVE Sensitive     CEFEPIME <=1 SENSITIVE Sensitive     CEFTAZIDIME <=1 SENSITIVE Sensitive     CEFTRIAXONE <=1 SENSITIVE Sensitive     CIPROFLOXACIN <=0.25 SENSITIVE Sensitive      GENTAMICIN <=1 SENSITIVE Sensitive     IMIPENEM <=0.25 SENSITIVE Sensitive     PIP/TAZO 8 SENSITIVE Sensitive     TOBRAMYCIN <=1 SENSITIVE Sensitive     TRIMETH/SULFA <=20 SENSITIVE Sensitive     * 15 COLONIES PANTOEA AGGLOMERANS  Culture, blood (routine x 2)     Status: None   Collection Time: 02/13/14  7:05 AM  Result Value Ref Range Status   Specimen Description BLOOD RIGHT HAND  Final   Special Requests BOTTLES DRAWN AEROBIC ONLY 10CC  Final   Culture  Setup Time   Final    02/13/2014 16:06 Performed at [facility]    Culture   Final    STAPHYLOCOCCUS SPECIES (COAGULASE NEGATIVE) Note: THE SIGNIFICANCE OF ISOLATING THIS ORGANISM FROM A SINGLE SET OF BLOOD CULTURES WHEN MULTIPLE SETS ARE DRAWN IS UNCERTAIN. PLEASE NOTIFY THE MICROBIOLOGY DEPARTMENT WITHIN ONE WEEK IF SPECIATION AND SENSITIVITIES ARE REQUIRED. Note: Gram Stain Report Called to,Read Back By and Verified With: Frederico Hamman RN [phone] Performed at [facility]    Report Status 02/16/2014 FINAL  Final  Culture, blood (routine x 2)     Status: None (Preliminary result)   Collection Time: 02/13/14  7:10 AM  Result Value Ref Range Status   Specimen Description BLOOD LEFT HAND  Final   Special Requests BOTTLES DRAWN AEROBIC AND ANAEROBIC 10CC  Final   Culture  Setup Time   Final    02/13/2014 16:06 Performed at [facility]    Culture   Final           BLOOD CULTURE RECEIVED NO GROWTH TO DATE CULTURE WILL BE HELD FOR 5 DAYS BEFORE ISSUING A FINAL NEGATIVE REPORT Performed at [facility]    Report Status PENDING  Incomplete    Studies/Results: No results found.   Assessment/Plan: P agglomerans bacteremia due to [facility] NICM DM2 BCx 1/2 GPC (Coag Neg Staph)  Total days of antibiotics  7/14 ceftaz --> ceftriaxone  Plan for 2 weeks of ceftriaxone Would repeat his BCx 1 week after he completes his IV therapy to assess for clearance.  available as needed.         Bobby Rumpf Infectious Diseases (pager) [phone] [email or web address] 02/18/2014, 11:23 AM  LOS: 6 days

## 2014-02-18 NOTE — Procedures (Signed)
Successful placement of right IJ approach tunneled dual lumen PICC line with tip at the superior caval-atrial junction.  The PICC line is ready for immediate use.

## 2014-02-18 NOTE — Progress Notes (Signed)
Advanced Heart Failure Rounding Note   Subjective:    Raymond Pratt is a 63 y.o. male with positive PMH of chronic combined systolic and diastolic CHF ([id number]) Echo: EF 20-25%, A-fib w/ RVR (2010 s/p failed cardioversion), DM, HTN and OSA on CPAP.   Admitted to [facility] 10/23 through 01/08/14 with volume overload and low output. He was diuresed with IV lasix and milrinone. He ultimately required home milrinone 0.125 mcg. Discharge weight was 260 pounds. [facility] followed once discharged.   On 11/19 pulled PICC out and replaced by IR. Says he flushed the catheter on 12/1 and 1 hour later had chills. On 12/3 flushed catheter again and had chills. No problems since then. HR was running in the 110-140s. 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 Underwent TEE-DC-CV (12/10). Remains in NSR, on amio.  Cultures repeated and 1/2 growing GPC  (likely contaminant). Off milrinone    Plan for PICC today.   Denies SOB. Wants to go home.     Objective:   Weight Range:  Vital Signs:   Temp:  [97.3 F (36.3 C)-98.4 F (36.9 C)] 97.5 F (36.4 C) (12/14 0300) Pulse Rate:  [63-69] 69 (12/14 0300) Resp:  [17-117] 17 (12/14 0300) BP: (72-123)/(51-88) 111/70 mmHg (12/14 0300) SpO2:  [94 %-98 %] 94 % (12/14 0300) Weight:  [268 lb 6.4 oz (121.745 kg)] 268 lb 6.4 oz (121.745 kg) (12/14 0300) Last BM Date: 02/17/14  Weight change: Filed Weights   02/16/14 0338 02/17/14 0340 02/18/14 0300  Weight: 273 lb 5.9 oz (124 kg) 269 lb 10 oz (122.3 kg) 268 lb 6.4 oz (121.745 kg)    Intake/Output:   Intake/Output Summary (Last 24 hours) at 02/18/14 0808 Last data filed at 02/18/14 0730  Gross per 24 hour  Intake    530 ml  Output   2450 ml  Net  -1920 ml     Physical Exam: General: Chronically ill appearing. No resp difficulty. Sitting in the chair.   HEENT: normal Neck: supple. JVP 7-8. Carotids 2+ bilat; no bruits. No  lymphadenopathy or thryomegaly appreciated. Cor: PMI nondisplaced. NSR. 2/6 SEM  Lungs: clear Abdomen: obese, soft, nontender, mildly distended. No hepatosplenomegaly. No bruits or masses. Good bowel sounds. Extremities: no cyanosis, clubbing, rash, no edema.  Neuro: alert & orientedx3, cranial nerves grossly intact. moves all 4 extremities w/o difficulty. Affect pleasant  Telemetry: NSR 65-75 Labs: Basic Metabolic Panel:  Recent Labs Lab 02/14/14 0244 02/15/14 0339 02/16/14 0303 02/17/14 0258 02/18/14 0300  NA 137 136* 137 137 132*  K 3.8 4.3 4.6 4.0 4.0  CL 93* 93* 95* 93* 91*  CO2 32 30 30 29 26   GLUCOSE 128* 159* 144* 104* 110*  BUN 29* 27* 31* 33* 38*  CREATININE 1.12 1.13 1.21 1.14 1.29  CALCIUM 9.3 9.8 9.8 10.1 9.7    Liver Function Tests:  Recent Labs Lab 02/12/14 2100 02/13/14 0214  AST 28 26  ALT 26 24  ALKPHOS 97 88  BILITOT 0.8 0.8  PROT 7.9 7.7  ALBUMIN 3.4* 3.1*   No results for input(s): LIPASE, AMYLASE in the last 168 hours. No results for input(s): AMMONIA in the last 168 hours.  CBC:  Recent Labs Lab 02/11/14 1601 02/12/14 2100 02/13/14 0214 02/14/14 0244 02/15/14 0339  WBC 12.6* 8.1 7.8 8.7 8.4  HGB 13.1 13.1 13.3 13.2 13.2  HCT 40.8 40.3 41.0 40.8 40.7  MCV 94.2 94.2 94.5  94.0 94.0  PLT 293 294 298 321 320    Cardiac Enzymes: No results for input(s): CKTOTAL, CKMB, CKMBINDEX, TROPONINI in the last 168 hours.  BNP: BNP (last 3 results)  Recent Labs  12/28/13 1210 01/01/14 0415  PROBNP 2624.0* 1882.0*     Other results:    Imaging: No results found.   Medications:     Scheduled Medications: . allopurinol  300 mg Oral Daily  . amiodarone  400 mg Oral BID  . aspirin  81 mg Oral Daily  . cefTRIAXone (ROCEPHIN)  IV  2 g Intravenous Q24H  . digoxin  0.125 mg Oral Daily  . insulin aspart  0-15 Units Subcutaneous TID WC  . insulin aspart  0-5 Units Subcutaneous QHS  . insulin aspart  3 Units Subcutaneous TID WC   . insulin glargine  30 Units Subcutaneous QHS  . losartan  12.5 mg Oral Daily  . metoprolol succinate  25 mg Oral BID  . potassium chloride SA  20 mEq Oral Daily  . simvastatin  20 mg Oral q1800  . spironolactone  25 mg Oral Daily  . torsemide  60 mg Oral BID  . Warfarin - Pharmacist Dosing Inpatient   Does not apply q1800    Infusions: . sodium chloride Stopped (02/16/14 0200)    PRN Medications: ALPRAZolam, ondansetron   Assessment:    1) Bacteremia - GN rods from blood cx - GPC 1/2, coag negative staph  2) Chronic combined systolic/diastolic HF 3) Atrial fibrillation, converted to NSR s/p DCCV on 12/10  4) HTN 5) OSA   Plan/Discussion:    Plan for PICC today. Plan to check CO-OX once placed as he is off milrinone. As long as CO-OX is stable will go home off milrinone.    Weight stable. Continue current HF meds.   Maintaining SR on amio. INR managed by Dr Virgina Jock .  Stable off milrinone. Surveillance bcx 1/2 CNS (contaminant). Will need 2 weeks IV abx.   Home today. [facility] to follow for home antibiotics at 4:00 pm   Length of Stay: 6 CLEGG,AMY  NP-C  02/18/2014, 8:08 AM Advanced Heart Failure Team Pager [phone] (M-F; 7a - 4p)  Please contact [facility] for night-coverage after hours (4p -7a ) and weekends on [email or web address]  Patient seen and examined with Darrick Grinder, NP. We discussed all aspects of the encounter. I agree with the assessment and plan as stated above.   Doing well. Maintaining SR. For Power Picc today. If co-ox ok can go home off milrinone and see how he does. Low threshold to restart.   Swayzee Wadley,MD 8:41 AM
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
## 2014-02-18 NOTE — Progress Notes (Signed)
CARE MANAGEMENT NOTE 02/18/2014  Patient:  Raymond Pratt, Raymond Pratt   Account Number:  [id number]  Date Initiated:  02/13/2014  Documentation initiated by:  Elissa Hefty  Subjective/Objective Assessment:   adm w pos cult, on home milrinone     Action/Plan:   lives at home, pcp dr Virgina Jock   Anticipated DC Date:  02/16/2014   Anticipated DC Plan:  [place]  CM consult      [facility] Choice  Resumption Of Svcs/PTA Provider   Choice offered to / List presented to:  C-1 Patient        [place] arranged  HH-1 RN  IV Antibiotics      [facility].   Status of service:  Completed, signed off Medicare Important Message given?   (If response is "NO", the following Medicare IM given date fields will be blank) Date Medicare IM given:   Medicare IM given by:   Date Additional Medicare IM given:   Additional Medicare IM given by:    Discharge Disposition:  [place]  Per UR Regulation:  Reviewed for med. necessity/level of care/duration of stay  If discussed at [place] of Stay Meetings, dates discussed:    Comments:  02/18/2014 [place] to make aware of scheduled dc home with IV abx. Jonnie Finner RN CCM Case Mgmt phone [phone]  02/18/2014 1130 Notified [facility] of dc home with IV abx scheduled for today. Spoke to [facility] IV rep, Babette Relic RN CCM Case Mgmt phone [phone]

## 2014-02-19 LAB — CULTURE, BLOOD (ROUTINE X 2): Culture: NO GROWTH

## 2014-02-25 ENCOUNTER — Inpatient Hospital Stay (HOSPITAL_COMMUNITY): Payer: BC Managed Care – PPO

## 2014-02-26 ENCOUNTER — Telehealth (HOSPITAL_COMMUNITY): Payer: Self-pay | Admitting: Vascular Surgery

## 2014-02-26 NOTE — Telephone Encounter (Signed)
Nurse from [facility] called for resert orders, labs and picc line care.. Please advise

## 2014-03-04 ENCOUNTER — Encounter: Payer: Self-pay | Admitting: Internal Medicine

## 2014-03-04 NOTE — Telephone Encounter (Signed)
Detailed message left for RN to recert orders.

## 2014-03-05 ENCOUNTER — Ambulatory Visit (HOSPITAL_COMMUNITY)
Admission: RE | Admit: 2014-03-05 | Discharge: 2014-03-05 | Disposition: A | Discharge status: Home / Self Care | Payer: BC Managed Care – PPO | Source: Ambulatory Visit | Attending: Internal Medicine | Admitting: Internal Medicine

## 2014-03-05 ENCOUNTER — Encounter (HOSPITAL_COMMUNITY): Payer: Self-pay

## 2014-03-05 VITALS — BP 130/84 | HR 75 | Resp 18 | Wt 284.4 lb

## 2014-03-05 DIAGNOSIS — I480 Paroxysmal atrial fibrillation: Secondary | ICD-10-CM | POA: Diagnosis not present

## 2014-03-05 DIAGNOSIS — Z7982 Long term (current) use of aspirin: Secondary | ICD-10-CM | POA: Insufficient documentation

## 2014-03-05 DIAGNOSIS — I10 Essential (primary) hypertension: Secondary | ICD-10-CM

## 2014-03-05 DIAGNOSIS — I482 Chronic atrial fibrillation: Secondary | ICD-10-CM | POA: Diagnosis not present

## 2014-03-05 DIAGNOSIS — Z79899 Other long term (current) drug therapy: Secondary | ICD-10-CM | POA: Diagnosis not present

## 2014-03-05 DIAGNOSIS — G4733 Obstructive sleep apnea (adult) (pediatric): Secondary | ICD-10-CM | POA: Insufficient documentation

## 2014-03-05 DIAGNOSIS — I429 Cardiomyopathy, unspecified: Secondary | ICD-10-CM | POA: Insufficient documentation

## 2014-03-05 DIAGNOSIS — E119 Type 2 diabetes mellitus without complications: Secondary | ICD-10-CM | POA: Diagnosis not present

## 2014-03-05 DIAGNOSIS — Z7901 Long term (current) use of anticoagulants: Secondary | ICD-10-CM | POA: Diagnosis not present

## 2014-03-05 DIAGNOSIS — I2510 Atherosclerotic heart disease of native coronary artery without angina pectoris: Secondary | ICD-10-CM | POA: Diagnosis not present

## 2014-03-05 DIAGNOSIS — E669 Obesity, unspecified: Secondary | ICD-10-CM | POA: Insufficient documentation

## 2014-03-05 DIAGNOSIS — Z794 Long term (current) use of insulin: Secondary | ICD-10-CM | POA: Insufficient documentation

## 2014-03-05 DIAGNOSIS — I5042 Chronic combined systolic (congestive) and diastolic (congestive) heart failure: Secondary | ICD-10-CM | POA: Diagnosis not present

## 2014-03-05 LAB — BASIC METABOLIC PANEL
Anion gap: 9 (ref 5–15)
BUN: 27 mg/dL — ABNORMAL HIGH (ref 6–23)
CHLORIDE: 96 meq/L (ref 96–112)
CO2: 31 mmol/L (ref 19–32)
Calcium: 9.4 mg/dL (ref 8.4–10.5)
Creatinine, Ser: 1.17 mg/dL (ref 0.50–1.35)
GFR calc Af Amer: 75 mL/min — ABNORMAL LOW (ref >90)
GFR calc non Af Amer: 65 mL/min — ABNORMAL LOW (ref >90)
Glucose, Bld: 206 mg/dL — ABNORMAL HIGH (ref 70–99)
Potassium: 3.9 mmol/L (ref 3.5–5.1)
Sodium: 136 mmol/L (ref 135–145)

## 2014-03-05 LAB — CARBOXYHEMOGLOBIN
Carboxyhemoglobin: 1 % (ref 0.5–1.5)
Methemoglobin: 0.8 % (ref 0.0–1.5)
O2 SAT: 79.7 %
Total hemoglobin: 14.9 g/dL (ref 13.5–18.0)

## 2014-03-05 LAB — TSH: TSH: 5.479 u[IU]/mL — ABNORMAL HIGH (ref 0.350–4.500)

## 2014-03-05 LAB — T4, FREE: FREE T4: 1.11 ng/dL (ref 0.80–1.80)

## 2014-03-05 LAB — BRAIN NATRIURETIC PEPTIDE: B NATRIURETIC PEPTIDE 5: 88.3 pg/mL (ref 0.0–100.0)

## 2014-03-05 LAB — DIGOXIN LEVEL: Digoxin Level: 0.6 ng/mL — ABNORMAL LOW (ref 0.8–2.0)

## 2014-03-05 MED ORDER — METOLAZONE 5 MG PO TABS: 5.0000 mg | ORAL_TABLET | ORAL | Status: DC

## 2014-03-05 NOTE — Patient Instructions (Signed)
Take 1 5mg  tablet of Metolazone weekly on TUESDAYS.  Will call you with lab results.  If they are normal we will arrange to remove PICC line.  Follow up in 1 month.  Happy New Year!  Do the following things EVERYDAY: 1) Weigh yourself in the morning before breakfast. Write it down and keep it in a log. 2) Take your medicines as prescribed 3) Eat low salt foods-Limit salt (sodium) to 2000 mg per day.  4) Stay as active as you can everyday 5) Limit all fluids for the day to less than 2 liters
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
## 2014-03-06 ENCOUNTER — Telehealth: Payer: Self-pay | Admitting: Licensed Clinical Social Worker

## 2014-03-06 NOTE — Telephone Encounter (Signed)
Patient returned call to discuss insurance options. Patient reports he currently has health insurance through the [place] and the premiums are going up $300 monthly because of his SSD ($1751.00) income. Patient inquired about Medicare and Medicaid. CSW discussed income guidelines for medicaid and that patient is over the income limit. Patient will become eligible for Medicare potentially after 2 years of receiving SSD (04/2015 ?). CSW encouraged patient to contact SS to discuss Medicare eligibility. Patient verbalizes understanding of follow up and will call CSW if furhter needs arise. Raquel Sarna, [place]

## 2014-03-06 NOTE — Telephone Encounter (Signed)
CSW referred to assist with insurance questions. CSW attempted to contact patient with message left. CSW will await return call from patient. Raquel Sarna, [place]

## 2014-03-07 ENCOUNTER — Ambulatory Visit (HOSPITAL_COMMUNITY)
Admission: RE | Admit: 2014-03-07 | Discharge: 2014-03-07 | Disposition: A | Discharge status: Home / Self Care | Payer: BC Managed Care – PPO | Source: Ambulatory Visit | Attending: Anesthesiology | Admitting: Anesthesiology

## 2014-03-07 ENCOUNTER — Other Ambulatory Visit (HOSPITAL_COMMUNITY): Payer: Self-pay

## 2014-03-07 ENCOUNTER — Telehealth (HOSPITAL_COMMUNITY): Payer: Self-pay

## 2014-03-07 DIAGNOSIS — I5041 Acute combined systolic (congestive) and diastolic (congestive) heart failure: Secondary | ICD-10-CM | POA: Diagnosis not present

## 2014-03-07 DIAGNOSIS — Z452 Encounter for adjustment and management of vascular access device: Secondary | ICD-10-CM | POA: Diagnosis not present

## 2014-03-07 DIAGNOSIS — I5022 Chronic systolic (congestive) heart failure: Secondary | ICD-10-CM

## 2014-03-07 MED ORDER — CHLORHEXIDINE GLUCONATE 4 % EX LIQD
CUTANEOUS | Status: AC
  Filled 2014-03-07: qty 15

## 2014-03-07 MED ORDER — LIDOCAINE HCL 1 % IJ SOLN
INTRAMUSCULAR | Status: AC
  Filled 2014-03-07: qty 20

## 2014-03-07 NOTE — Telephone Encounter (Signed)
Patient strongly requests tunneled catheter removal, per Junie Bame NP-C, okayed per patient preference, though we stressed convenience and rational on keeping line.  Scheduled for removal today through IR.  Patient aware and agreeable.  Renee Pain

## 2014-03-07 NOTE — Procedures (Signed)
Interventional Radiology Procedure Note  Procedure: Bedside removal of tunneled PICC  Complications: No immediate Recommendations:  - Ok to shower tomorrow - Do not submerge for 7 days   Signed,  Dulcy Fanny. Earleen Newport, DO

## 2014-03-11 ENCOUNTER — Telehealth (HOSPITAL_COMMUNITY): Payer: Self-pay | Admitting: Vascular Surgery

## 2014-03-11 NOTE — Telephone Encounter (Signed)
Pt called he wants to cancel his advance home care pt insurance is canceled.. Please advise

## 2014-03-11 NOTE — Telephone Encounter (Signed)
Spoke w/pt he is unsure if insurance was cancelled first of year or not, will have Kennyth Lose, [place] f/u with pt 1/5

## 2014-03-13 NOTE — Telephone Encounter (Signed)
Pt does not want [facility] services any more and is no longer home bound and is out of town today, message sent to [facility] to d/c orders, Kennyth Lose CSW is working on pt regarding insurance

## 2014-04-04 ENCOUNTER — Encounter (HOSPITAL_COMMUNITY): Payer: BC Managed Care – PPO

## 2014-07-01 ENCOUNTER — Telehealth (HOSPITAL_COMMUNITY): Payer: Self-pay | Admitting: Vascular Surgery

## 2014-07-01 ENCOUNTER — Encounter (HOSPITAL_COMMUNITY): Payer: Self-pay | Admitting: Vascular Surgery

## 2014-08-15 NOTE — Telephone Encounter (Signed)
Open in error

## 2014-10-23 ENCOUNTER — Emergency Department (HOSPITAL_COMMUNITY): Payer: BLUE CROSS/BLUE SHIELD

## 2014-10-23 ENCOUNTER — Encounter (HOSPITAL_COMMUNITY): Payer: Self-pay | Admitting: Emergency Medicine

## 2014-10-23 ENCOUNTER — Emergency Department (HOSPITAL_COMMUNITY)
Admission: EM | Admit: 2014-10-23 | Discharge: 2014-10-23 | Disposition: A | Discharge status: Home / Self Care | Payer: BLUE CROSS/BLUE SHIELD | Attending: Emergency Medicine | Admitting: Emergency Medicine

## 2014-10-23 DIAGNOSIS — I4891 Unspecified atrial fibrillation: Secondary | ICD-10-CM | POA: Insufficient documentation

## 2014-10-23 DIAGNOSIS — M19042 Primary osteoarthritis, left hand: Secondary | ICD-10-CM | POA: Diagnosis not present

## 2014-10-23 DIAGNOSIS — I10 Essential (primary) hypertension: Secondary | ICD-10-CM | POA: Insufficient documentation

## 2014-10-23 DIAGNOSIS — M436 Torticollis: Secondary | ICD-10-CM | POA: Insufficient documentation

## 2014-10-23 DIAGNOSIS — Z794 Long term (current) use of insulin: Secondary | ICD-10-CM | POA: Diagnosis not present

## 2014-10-23 DIAGNOSIS — Z7982 Long term (current) use of aspirin: Secondary | ICD-10-CM | POA: Diagnosis not present

## 2014-10-23 DIAGNOSIS — R51 Headache: Secondary | ICD-10-CM | POA: Diagnosis not present

## 2014-10-23 DIAGNOSIS — Z8619 Personal history of other infectious and parasitic diseases: Secondary | ICD-10-CM | POA: Insufficient documentation

## 2014-10-23 DIAGNOSIS — Z7901 Long term (current) use of anticoagulants: Secondary | ICD-10-CM | POA: Insufficient documentation

## 2014-10-23 DIAGNOSIS — Z9981 Dependence on supplemental oxygen: Secondary | ICD-10-CM | POA: Diagnosis not present

## 2014-10-23 DIAGNOSIS — I5042 Chronic combined systolic (congestive) and diastolic (congestive) heart failure: Secondary | ICD-10-CM | POA: Insufficient documentation

## 2014-10-23 DIAGNOSIS — Z79899 Other long term (current) drug therapy: Secondary | ICD-10-CM | POA: Diagnosis not present

## 2014-10-23 DIAGNOSIS — E669 Obesity, unspecified: Secondary | ICD-10-CM | POA: Diagnosis not present

## 2014-10-23 DIAGNOSIS — I2510 Atherosclerotic heart disease of native coronary artery without angina pectoris: Secondary | ICD-10-CM | POA: Diagnosis not present

## 2014-10-23 DIAGNOSIS — Z87828 Personal history of other (healed) physical injury and trauma: Secondary | ICD-10-CM | POA: Diagnosis not present

## 2014-10-23 DIAGNOSIS — E119 Type 2 diabetes mellitus without complications: Secondary | ICD-10-CM | POA: Insufficient documentation

## 2014-10-23 DIAGNOSIS — Z87440 Personal history of urinary (tract) infections: Secondary | ICD-10-CM | POA: Diagnosis not present

## 2014-10-23 DIAGNOSIS — Z9889 Other specified postprocedural states: Secondary | ICD-10-CM | POA: Insufficient documentation

## 2014-10-23 DIAGNOSIS — G4733 Obstructive sleep apnea (adult) (pediatric): Secondary | ICD-10-CM | POA: Insufficient documentation

## 2014-10-23 DIAGNOSIS — M542 Cervicalgia: Secondary | ICD-10-CM | POA: Diagnosis present

## 2014-10-23 DIAGNOSIS — Z872 Personal history of diseases of the skin and subcutaneous tissue: Secondary | ICD-10-CM | POA: Insufficient documentation

## 2014-10-23 DIAGNOSIS — M19041 Primary osteoarthritis, right hand: Secondary | ICD-10-CM | POA: Insufficient documentation

## 2014-10-23 LAB — BASIC METABOLIC PANEL
Anion gap: 14 (ref 5–15)
BUN: 24 mg/dL — AB (ref 6–20)
CHLORIDE: 91 mmol/L — AB (ref 101–111)
CO2: 28 mmol/L (ref 22–32)
CREATININE: 1.36 mg/dL — AB (ref 0.61–1.24)
Calcium: 9 mg/dL (ref 8.9–10.3)
GFR calc Af Amer: >60 mL/min (ref >60)
GFR, EST NON AFRICAN AMERICAN: 54 mL/min — AB (ref >60)
Glucose, Bld: 200 mg/dL — ABNORMAL HIGH (ref 65–99)
Potassium: 3.3 mmol/L — ABNORMAL LOW (ref 3.5–5.1)
Sodium: 133 mmol/L — ABNORMAL LOW (ref 135–145)

## 2014-10-23 LAB — CBC
HEMATOCRIT: 40.8 % (ref 39.0–52.0)
Hemoglobin: 13.9 g/dL (ref 13.0–17.0)
MCH: 31.2 pg (ref 26.0–34.0)
MCHC: 34.1 g/dL (ref 30.0–36.0)
MCV: 91.5 fL (ref 78.0–100.0)
Platelets: 287 10*3/uL (ref 150–400)
RBC: 4.46 MIL/uL (ref 4.22–5.81)
RDW: 14.4 % (ref 11.5–15.5)
WBC: 10.9 10*3/uL — ABNORMAL HIGH (ref 4.0–10.5)

## 2014-10-23 LAB — PROTIME-INR
INR: 1.5 — ABNORMAL HIGH (ref 0.00–1.49)
Prothrombin Time: 18.2 seconds — ABNORMAL HIGH (ref 11.6–15.2)

## 2014-10-23 LAB — VIRAL CULTURE VIRC: Culture: UNDETERMINED

## 2014-10-23 LAB — I-STAT TROPONIN, ED: Troponin i, poc: 0.01 ng/mL (ref 0.00–0.08)

## 2014-10-23 MED ORDER — HYDROMORPHONE HCL 1 MG/ML IJ SOLN
1.0000 mg | Freq: Once | INTRAMUSCULAR | Status: AC
  Administered 2014-10-23: 1 mg via INTRAVENOUS
  Filled 2014-10-23: qty 1

## 2014-10-23 MED ORDER — CYCLOBENZAPRINE HCL 5 MG PO TABS: 5.0000 mg | ORAL_TABLET | Freq: Three times a day (TID) | ORAL | Status: DC | PRN

## 2014-10-23 MED ORDER — HYDROCODONE-ACETAMINOPHEN 5-325 MG PO TABS: 1.0000 | ORAL_TABLET | ORAL | Status: DC | PRN

## 2014-10-23 NOTE — ED Notes (Signed)
Patient started yesterday with "just a crick in neck" and has progressed to right-sided neck and head pain.  Patient states "stiff and a lot of pain" to neck and right side of head.  Patient states pain 10/10.
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
## 2014-10-23 NOTE — ED Notes (Signed)
Returned from [employer], pt continues to c/o pain 10/10-- pt drove self to hospital, states will drive himself home, per pt.

## 2014-10-23 NOTE — ED Notes (Addendum)
Pt states when he woke up yesterday, thought he had a "crick" in his neck , gradually gotten worse, with headache.  States is a pt of Dr. Haroldine Laws.  Also has shingles on abd-- dried and scabbed over,

## 2014-10-23 NOTE — Discharge Instructions (Signed)

## 2014-10-23 NOTE — ED Notes (Signed)
Pt placed in gown and in bed. Pt monitored by pulse ox, bp cuff, and 12-lead. 

## 2014-11-16 IMAGING — CT CT ABD-PELV W/O CM
2 of 4 series · 9 of 46 positions shown, 10 images · non-contrast
Comparison: No priors.

CLINICAL DATA: 63-year-old male with history of abdominal pain and
nausea. Acute signs of liver and renal failure.

EXAM:
CT ABDOMEN AND PELVIS WITHOUT CONTRAST
TECHNIQUE: Multidetector CT imaging of the abdomen and pelvis was performed
following the standard protocol without IV contrast.

[Series 201: routine, idose (2) · axial · 0.98mm/px · z∈[-607,-207]mm · 6 of 96 slices shown, 7 images]
[im 8/96  soft-tissue]
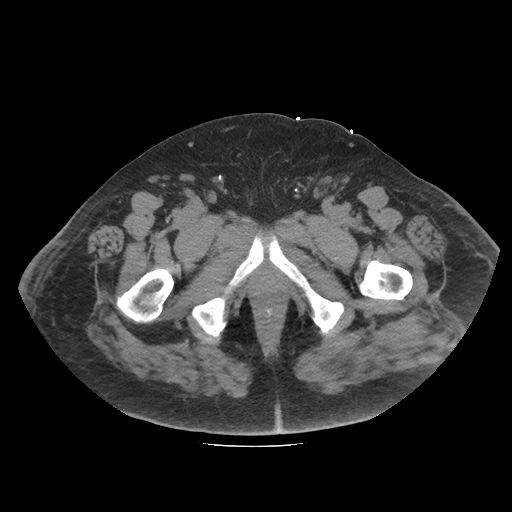
[im 8/96  bone]
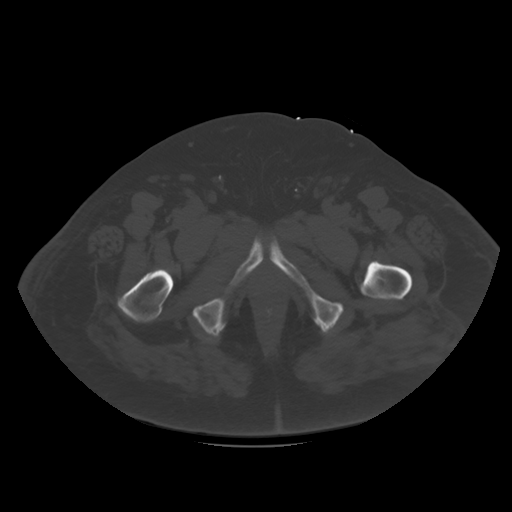
[im 24/96  soft-tissue]
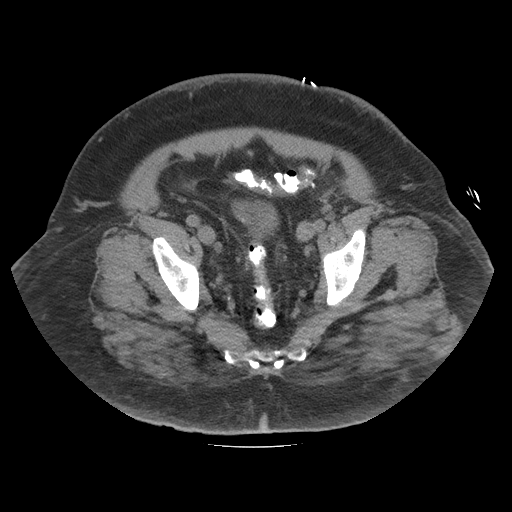
[im 40/96  soft-tissue]
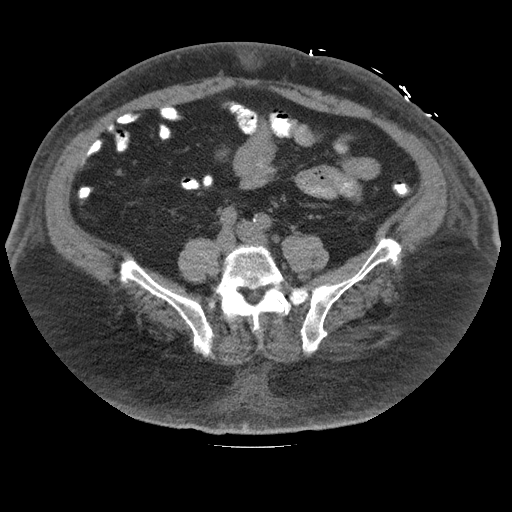
[im 56/96  soft-tissue]
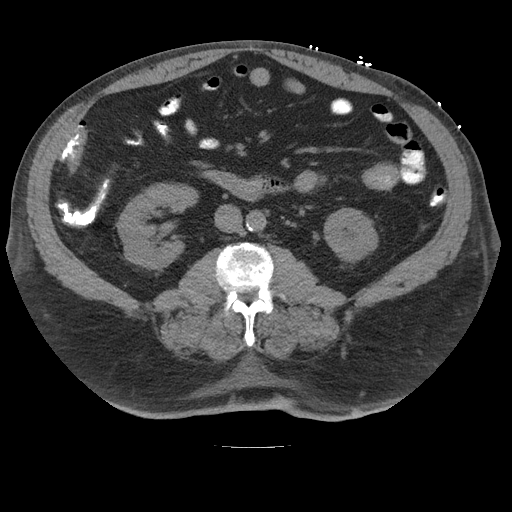
[im 72/96  soft-tissue]
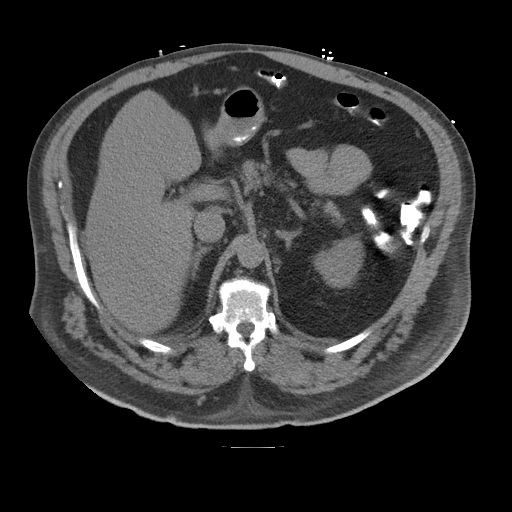
[im 88/96  soft-tissue]
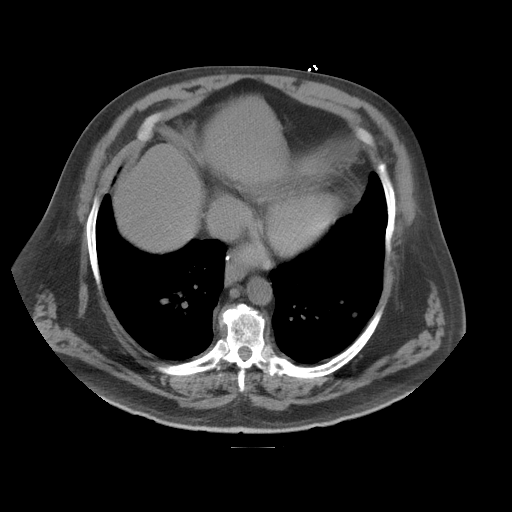

[Series 202: coronals, idose (2) · coronal · 0.50mm/px · 3 of 163 slices shown]
[im 55/163  soft-tissue]
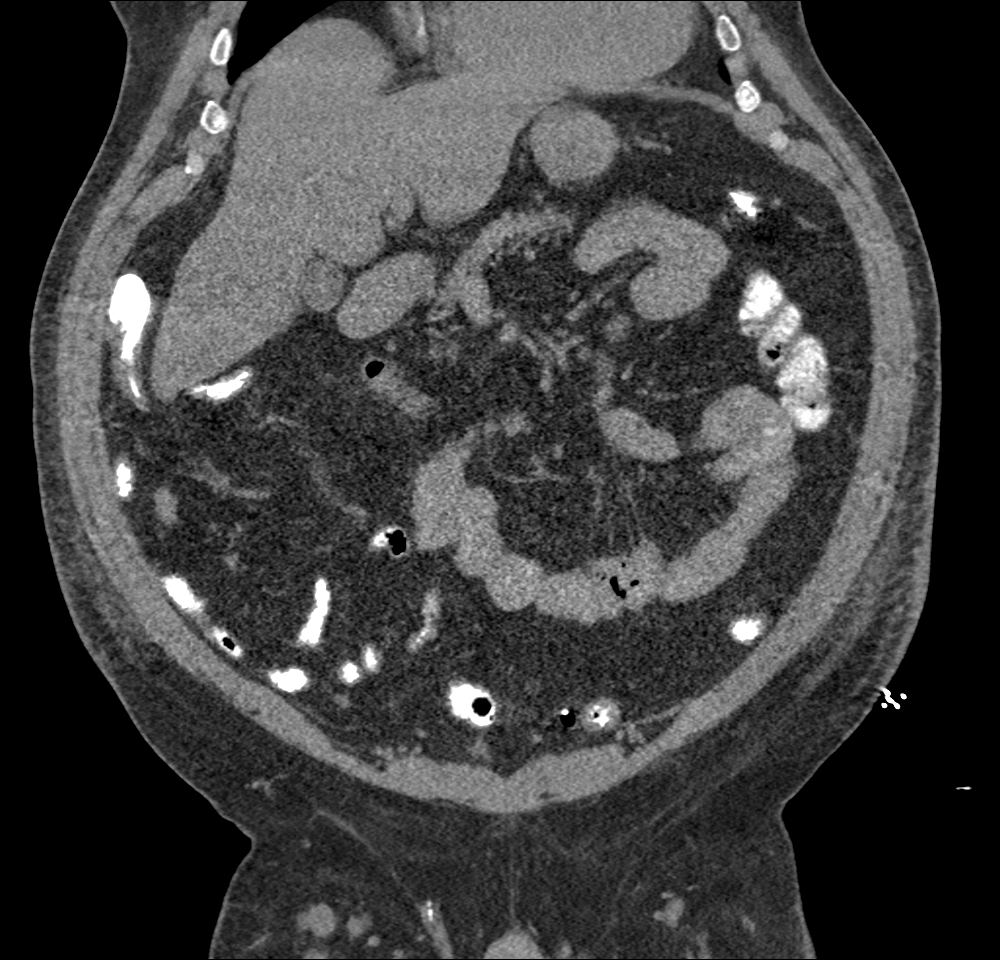
[im 73/163  soft-tissue]
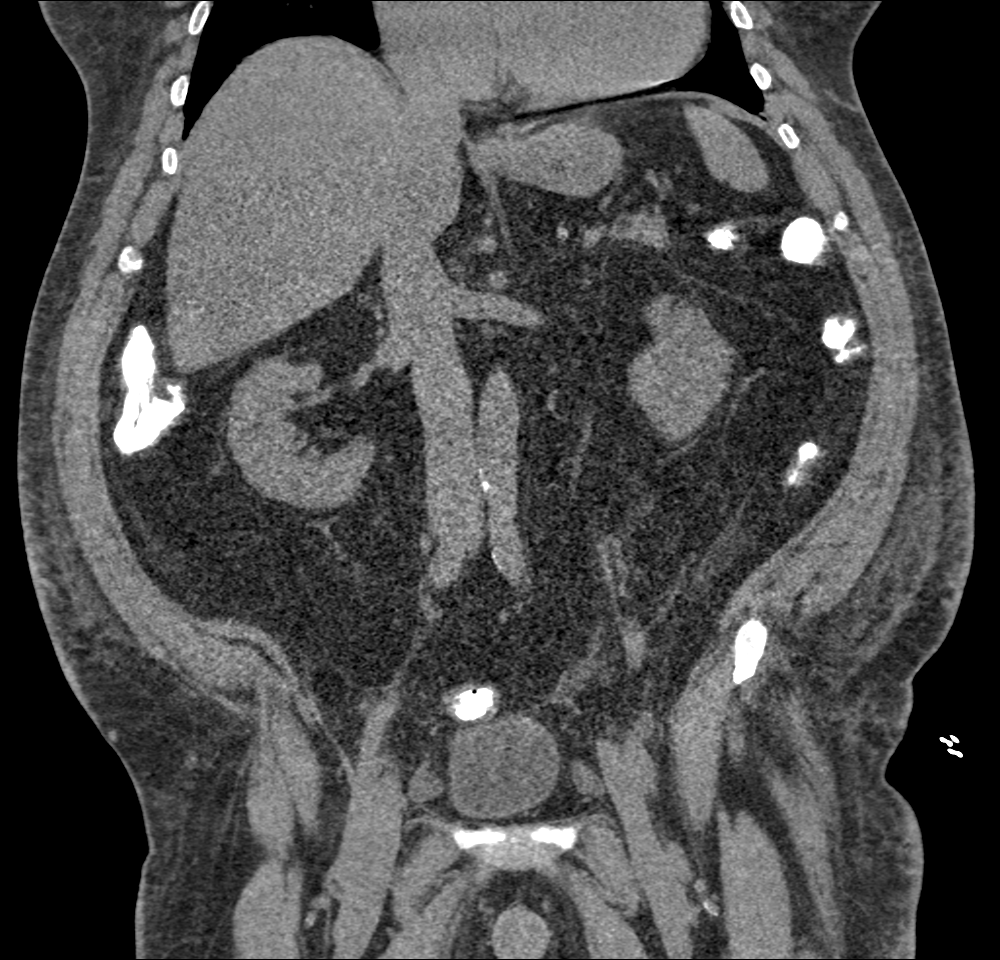
[im 91/163  soft-tissue]
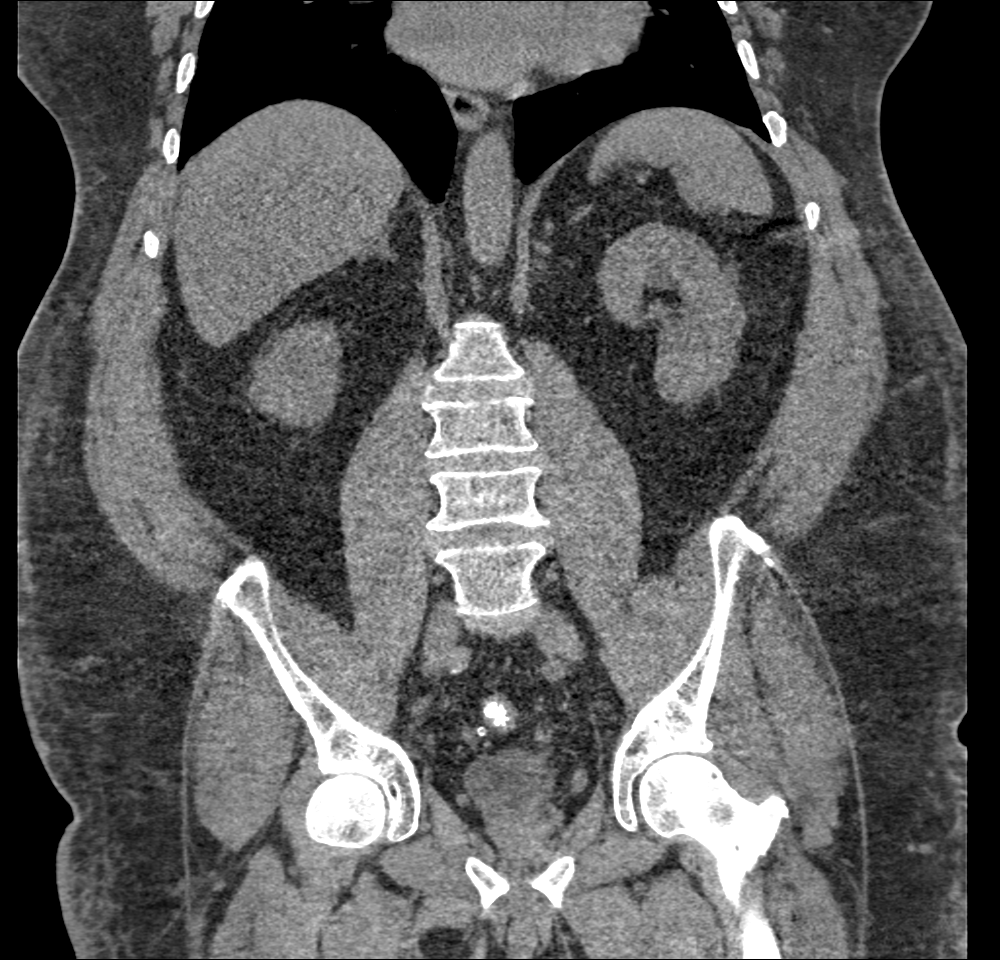

[9 of 46 positions shown; findings below may reference images not displayed]

FINDINGS: Lower chest: 8 x 10 mm smoothly marginated left lower lobe pulmonary
nodule (image 10 of series 205). A few tiny pleural-based
calcifications are noted in the lower right hemithorax. Moderate
cardiomegaly. Atherosclerotic calcifications in the right coronary
artery. No pericardial fluid, thickening or pericardial
calcification.

Hepatobiliary: Mild diffuse decreased attenuation throughout the
hepatic parenchyma, compatible with hepatic steatosis. No definite
focal hepatic lesions. Gallbladder is unremarkable in appearance.

Pancreas: Unremarkable.

Spleen: Unremarkable.

Adrenals/Urinary Tract: 4 mm calcification in the lower pole
collecting system of the right kidney, likely to represent a
nonobstructive calculus. No additional calculi are identified within
the left renal collecting system, along the course of either ureter,
or within the lumen of the urinary bladder. No hydroureteronephrosis
to indicate urinary tract obstruction at this time. Urinary bladder
is unremarkable in appearance. Bilateral adrenal glands are normal
in appearance.

Stomach/Bowel: Normal appearance of the stomach. No pathologic
dilatation of the small bowel or colon. Normal appendix.

Vascular/Lymphatic: Atherosclerosis throughout the abdominal and
pelvic vasculature, without definite aneurysm. No pathologically
enlarged lymph nodes are noted in the abdomen or pelvis on today's
non contrast CT examination. Numerous prominent borderline enlarged
bilateral inguinal and left external iliac lymph nodes are noted,
but are nonspecific.

Reproductive: Prostate gland is heavily calcified (nonspecific).

Other: Trace volume of ascites.  No pneumoperitoneum.

Musculoskeletal: There are no aggressive appearing lytic or blastic
lesions noted in the visualized portions of the skeleton.
IMPRESSION: 1. No definite acute findings in the abdomen or pelvis.
2. There is a trace volume of ascites.
3. 4 mm nonobstructive calculus in the lower pole collecting system
of the right kidney.
4. 10 x 8 mm left lower lobe pulmonary nodule (image 10 of series
205). A short-term follow-up chest CT is recommended in 3 months to
ensure the stability or resolution of this finding, as neoplasm is
not excluded.
5. Mild hepatic steatosis.
6. Moderate cardiomegaly.
7. Atherosclerosis, including right coronary artery disease. Please
note that although the presence of coronary artery calcium documents
the presence of coronary artery disease, the severity of this
disease and any potential stenosis cannot be assessed on this
non-gated CT examination. Assessment for potential risk factor
modification, dietary therapy or pharmacologic therapy may be
warranted, if clinically indicated.

## 2014-12-18 ENCOUNTER — Telehealth (HOSPITAL_COMMUNITY): Payer: Self-pay | Admitting: Cardiology

## 2014-12-18 NOTE — Telephone Encounter (Signed)
Received faxed correspondence from patients PCP regarding CHF  Patient declined admission and plan to diurese and follow up in the PCP office x 1r week  Reviewed with Dr. Haroldine Laws and per MD patient should for up with the CHF clinic x 1 week

## 2014-12-25 NOTE — Telephone Encounter (Signed)
Late entry: spoke with pt directly on 12/19/14 Pt reports he cannot afford co pay and bill associated with office visits for CHF clinic, pt states he will keep follow up scheduled with PCP will call to make appointment if no changes are seen in symptoms

## 2015-01-06 IMAGING — US IR FLUORO GUIDE CV LINE*R*
1 series · 1 of 1 positions shown · non-contrast
Comparison: none

INDICATION: Poor venous access. In need of intravenous access for blood draws
and medication administration. Note, the patient has undergone and
two recent upper extremity approach PICC lines, one of which he
inadvertently removed, the second of which became infected secondary
to patient's poor hygiene. As such, request has now been made for
placement of a tunneled IJ PICC line for more durable centric venous
access.

[Series 1: ir fluoro guide cv line*right* · 1 of 1 slices shown]
[im 1/1]
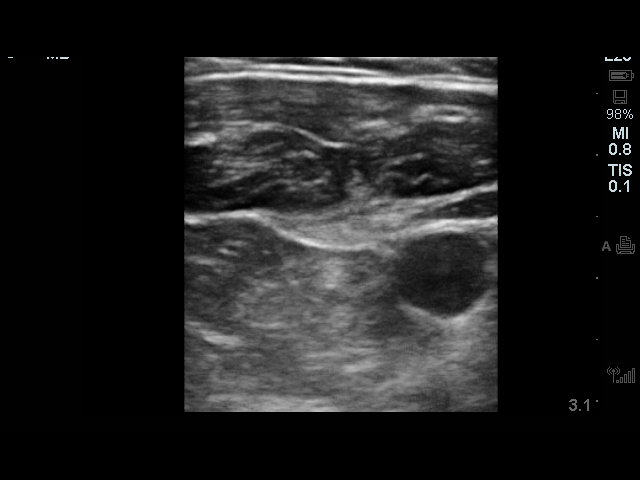

[1 of 1 positions shown; findings below may reference images not displayed]

EXAM:
TUNNELED PICC PLACEMENT WITH ULTRASOUND AND FLUOROSCOPIC GUIDANCE

MEDICATIONS:
The patient is currently admitted to the hospital and receiving
intravenous antibiotics.

The IV antibiotic was given in an appropriate time interval prior to
skin puncture.

CONTRAST:  None

ANESTHESIA/SEDATION:
None

FLUOROSCOPY TIME:  12 seconds (8.2 mGy)

COMPLICATIONS:
None immediate



After creating a small venotomy incision, a micropuncture kit was
utilized to access the right internal jugular vein under direct,
real-time ultrasound guidance after the overlying soft tissues were
anesthetized with 1% lidocaine with epinephrine. Ultrasound image
documentation was performed. The microwire was kinked to measure
appropriate catheter length. The micropuncture sheath was exchanged
for a peel-away sheath over a guidewire. A tunneled PICC measuring
26 cm from tip to cuff was tunneled in a retrograde fashion from the
anterior chest wall to the venotomy incision.

The catheter was then placed through the peel-away sheath with tips
ultimately positioned within the superior cavoatrial junction. Final
catheter positioning was confirmed and documented with a spot
radiographic image. The catheter aspirates and flushes normally. The
catheter was flushed with appropriate volume heparin dwells.

The catheter exit site was secured with a 0-Prolene retention
suture. The venotomy incision was closed with an interrupted 4-0
Vicryl, Dermabond and Tuga. Dressings were applied. The
patient tolerated the procedure well without immediate post
procedural complication.
IMPRESSION: Successful placement of 26 cm tip to cuff tunneled PICC via the
right internal jugular vein with tip terminating within the superior
cavoatrial junction. The catheter is ready for immediate use.

## 2015-05-01 DIAGNOSIS — I480 Paroxysmal atrial fibrillation: Secondary | ICD-10-CM | POA: Diagnosis not present

## 2015-05-01 DIAGNOSIS — Z7901 Long term (current) use of anticoagulants: Secondary | ICD-10-CM | POA: Diagnosis not present

## 2015-05-16 DIAGNOSIS — Z7901 Long term (current) use of anticoagulants: Secondary | ICD-10-CM | POA: Diagnosis not present

## 2015-05-16 DIAGNOSIS — I480 Paroxysmal atrial fibrillation: Secondary | ICD-10-CM | POA: Diagnosis not present

## 2015-05-30 ENCOUNTER — Encounter (HOSPITAL_COMMUNITY): Payer: Self-pay | Admitting: Vascular Surgery

## 2015-05-30 DIAGNOSIS — G4733 Obstructive sleep apnea (adult) (pediatric): Secondary | ICD-10-CM | POA: Diagnosis not present

## 2015-05-30 DIAGNOSIS — N183 Chronic kidney disease, stage 3 (moderate): Secondary | ICD-10-CM | POA: Diagnosis not present

## 2015-05-30 DIAGNOSIS — I2510 Atherosclerotic heart disease of native coronary artery without angina pectoris: Secondary | ICD-10-CM | POA: Diagnosis not present

## 2015-05-30 DIAGNOSIS — R627 Adult failure to thrive: Secondary | ICD-10-CM | POA: Diagnosis not present

## 2015-05-30 DIAGNOSIS — I5042 Chronic combined systolic (congestive) and diastolic (congestive) heart failure: Secondary | ICD-10-CM | POA: Diagnosis not present

## 2015-05-30 DIAGNOSIS — I480 Paroxysmal atrial fibrillation: Secondary | ICD-10-CM | POA: Diagnosis not present

## 2015-05-30 DIAGNOSIS — E1151 Type 2 diabetes mellitus with diabetic peripheral angiopathy without gangrene: Secondary | ICD-10-CM | POA: Diagnosis not present

## 2015-05-30 DIAGNOSIS — Z7901 Long term (current) use of anticoagulants: Secondary | ICD-10-CM | POA: Diagnosis not present

## 2015-05-30 DIAGNOSIS — I272 Other secondary pulmonary hypertension: Secondary | ICD-10-CM | POA: Diagnosis not present

## 2015-07-03 DIAGNOSIS — Z7901 Long term (current) use of anticoagulants: Secondary | ICD-10-CM | POA: Diagnosis not present

## 2015-07-03 DIAGNOSIS — M109 Gout, unspecified: Secondary | ICD-10-CM | POA: Diagnosis not present

## 2015-07-03 DIAGNOSIS — I480 Paroxysmal atrial fibrillation: Secondary | ICD-10-CM | POA: Diagnosis not present

## 2015-08-11 DIAGNOSIS — I130 Hypertensive heart and chronic kidney disease with heart failure and stage 1 through stage 4 chronic kidney disease, or unspecified chronic kidney disease: Secondary | ICD-10-CM | POA: Diagnosis not present

## 2015-08-11 DIAGNOSIS — E1151 Type 2 diabetes mellitus with diabetic peripheral angiopathy without gangrene: Secondary | ICD-10-CM | POA: Diagnosis not present

## 2015-08-11 DIAGNOSIS — M109 Gout, unspecified: Secondary | ICD-10-CM | POA: Diagnosis not present

## 2015-08-11 DIAGNOSIS — I509 Heart failure, unspecified: Secondary | ICD-10-CM | POA: Diagnosis not present

## 2015-08-11 DIAGNOSIS — I480 Paroxysmal atrial fibrillation: Secondary | ICD-10-CM | POA: Diagnosis not present

## 2015-08-11 DIAGNOSIS — Z7901 Long term (current) use of anticoagulants: Secondary | ICD-10-CM | POA: Diagnosis not present

## 2015-09-05 DIAGNOSIS — I130 Hypertensive heart and chronic kidney disease with heart failure and stage 1 through stage 4 chronic kidney disease, or unspecified chronic kidney disease: Secondary | ICD-10-CM | POA: Diagnosis not present

## 2015-09-05 DIAGNOSIS — M109 Gout, unspecified: Secondary | ICD-10-CM | POA: Diagnosis not present

## 2015-09-05 DIAGNOSIS — I5042 Chronic combined systolic (congestive) and diastolic (congestive) heart failure: Secondary | ICD-10-CM | POA: Diagnosis not present

## 2015-09-05 DIAGNOSIS — E1151 Type 2 diabetes mellitus with diabetic peripheral angiopathy without gangrene: Secondary | ICD-10-CM | POA: Diagnosis not present

## 2015-09-05 DIAGNOSIS — I480 Paroxysmal atrial fibrillation: Secondary | ICD-10-CM | POA: Diagnosis not present

## 2015-09-05 DIAGNOSIS — I2781 Cor pulmonale (chronic): Secondary | ICD-10-CM | POA: Diagnosis not present

## 2015-09-05 DIAGNOSIS — I272 Other secondary pulmonary hypertension: Secondary | ICD-10-CM | POA: Diagnosis not present

## 2015-09-05 DIAGNOSIS — Z7901 Long term (current) use of anticoagulants: Secondary | ICD-10-CM | POA: Diagnosis not present

## 2015-10-03 DIAGNOSIS — I480 Paroxysmal atrial fibrillation: Secondary | ICD-10-CM | POA: Diagnosis not present

## 2015-10-03 DIAGNOSIS — Z7901 Long term (current) use of anticoagulants: Secondary | ICD-10-CM | POA: Diagnosis not present

## 2015-10-08 ENCOUNTER — Encounter (HOSPITAL_COMMUNITY): Payer: BLUE CROSS/BLUE SHIELD

## 2015-10-31 DIAGNOSIS — Z7901 Long term (current) use of anticoagulants: Secondary | ICD-10-CM | POA: Diagnosis not present

## 2015-10-31 DIAGNOSIS — I480 Paroxysmal atrial fibrillation: Secondary | ICD-10-CM | POA: Diagnosis not present

## 2015-11-25 DIAGNOSIS — I480 Paroxysmal atrial fibrillation: Secondary | ICD-10-CM | POA: Diagnosis not present

## 2015-11-25 DIAGNOSIS — Z7901 Long term (current) use of anticoagulants: Secondary | ICD-10-CM | POA: Diagnosis not present

## 2015-12-22 DIAGNOSIS — I480 Paroxysmal atrial fibrillation: Secondary | ICD-10-CM | POA: Diagnosis not present

## 2015-12-22 DIAGNOSIS — M542 Cervicalgia: Secondary | ICD-10-CM | POA: Diagnosis not present

## 2015-12-22 DIAGNOSIS — Z7901 Long term (current) use of anticoagulants: Secondary | ICD-10-CM | POA: Diagnosis not present

## 2016-01-27 DIAGNOSIS — I480 Paroxysmal atrial fibrillation: Secondary | ICD-10-CM | POA: Diagnosis not present

## 2016-01-27 DIAGNOSIS — Z7901 Long term (current) use of anticoagulants: Secondary | ICD-10-CM | POA: Diagnosis not present

## 2016-01-27 DIAGNOSIS — N183 Chronic kidney disease, stage 3 (moderate): Secondary | ICD-10-CM | POA: Diagnosis not present

## 2016-01-27 DIAGNOSIS — M109 Gout, unspecified: Secondary | ICD-10-CM | POA: Diagnosis not present

## 2016-01-27 DIAGNOSIS — Z794 Long term (current) use of insulin: Secondary | ICD-10-CM | POA: Diagnosis not present

## 2016-01-27 DIAGNOSIS — E1151 Type 2 diabetes mellitus with diabetic peripheral angiopathy without gangrene: Secondary | ICD-10-CM | POA: Diagnosis not present

## 2016-01-27 DIAGNOSIS — I130 Hypertensive heart and chronic kidney disease with heart failure and stage 1 through stage 4 chronic kidney disease, or unspecified chronic kidney disease: Secondary | ICD-10-CM | POA: Diagnosis not present

## 2016-01-27 DIAGNOSIS — I5042 Chronic combined systolic (congestive) and diastolic (congestive) heart failure: Secondary | ICD-10-CM | POA: Diagnosis not present

## 2016-02-20 DIAGNOSIS — Z7901 Long term (current) use of anticoagulants: Secondary | ICD-10-CM | POA: Diagnosis not present

## 2016-02-20 DIAGNOSIS — I480 Paroxysmal atrial fibrillation: Secondary | ICD-10-CM | POA: Diagnosis not present

## 2016-03-31 DIAGNOSIS — Z7901 Long term (current) use of anticoagulants: Secondary | ICD-10-CM | POA: Diagnosis not present

## 2016-03-31 DIAGNOSIS — I480 Paroxysmal atrial fibrillation: Secondary | ICD-10-CM | POA: Diagnosis not present

## 2016-05-14 DIAGNOSIS — I5042 Chronic combined systolic (congestive) and diastolic (congestive) heart failure: Secondary | ICD-10-CM | POA: Diagnosis not present

## 2016-05-14 DIAGNOSIS — G4733 Obstructive sleep apnea (adult) (pediatric): Secondary | ICD-10-CM | POA: Diagnosis not present

## 2016-05-14 DIAGNOSIS — R627 Adult failure to thrive: Secondary | ICD-10-CM | POA: Diagnosis not present

## 2016-05-14 DIAGNOSIS — I130 Hypertensive heart and chronic kidney disease with heart failure and stage 1 through stage 4 chronic kidney disease, or unspecified chronic kidney disease: Secondary | ICD-10-CM | POA: Diagnosis not present

## 2016-05-14 DIAGNOSIS — I2789 Other specified pulmonary heart diseases: Secondary | ICD-10-CM | POA: Diagnosis not present

## 2016-05-14 DIAGNOSIS — E1151 Type 2 diabetes mellitus with diabetic peripheral angiopathy without gangrene: Secondary | ICD-10-CM | POA: Diagnosis not present

## 2016-05-14 DIAGNOSIS — Z7901 Long term (current) use of anticoagulants: Secondary | ICD-10-CM | POA: Diagnosis not present

## 2016-05-14 DIAGNOSIS — I480 Paroxysmal atrial fibrillation: Secondary | ICD-10-CM | POA: Diagnosis not present

## 2016-06-18 DIAGNOSIS — I509 Heart failure, unspecified: Secondary | ICD-10-CM | POA: Diagnosis not present

## 2016-06-18 DIAGNOSIS — Z7901 Long term (current) use of anticoagulants: Secondary | ICD-10-CM | POA: Diagnosis not present

## 2016-06-18 DIAGNOSIS — E1151 Type 2 diabetes mellitus with diabetic peripheral angiopathy without gangrene: Secondary | ICD-10-CM | POA: Diagnosis not present

## 2016-06-18 DIAGNOSIS — I480 Paroxysmal atrial fibrillation: Secondary | ICD-10-CM | POA: Diagnosis not present

## 2016-06-18 DIAGNOSIS — Z6841 Body Mass Index (BMI) 40.0 and over, adult: Secondary | ICD-10-CM | POA: Diagnosis not present

## 2016-07-16 DIAGNOSIS — I130 Hypertensive heart and chronic kidney disease with heart failure and stage 1 through stage 4 chronic kidney disease, or unspecified chronic kidney disease: Secondary | ICD-10-CM | POA: Diagnosis not present

## 2016-07-16 DIAGNOSIS — Z7901 Long term (current) use of anticoagulants: Secondary | ICD-10-CM | POA: Diagnosis not present

## 2016-07-16 DIAGNOSIS — E1151 Type 2 diabetes mellitus with diabetic peripheral angiopathy without gangrene: Secondary | ICD-10-CM | POA: Diagnosis not present

## 2016-07-16 DIAGNOSIS — R609 Edema, unspecified: Secondary | ICD-10-CM | POA: Diagnosis not present

## 2016-07-16 DIAGNOSIS — Z6841 Body Mass Index (BMI) 40.0 and over, adult: Secondary | ICD-10-CM | POA: Diagnosis not present

## 2016-07-16 DIAGNOSIS — I480 Paroxysmal atrial fibrillation: Secondary | ICD-10-CM | POA: Diagnosis not present

## 2016-08-13 DIAGNOSIS — Z794 Long term (current) use of insulin: Secondary | ICD-10-CM | POA: Diagnosis not present

## 2016-08-13 DIAGNOSIS — E1151 Type 2 diabetes mellitus with diabetic peripheral angiopathy without gangrene: Secondary | ICD-10-CM | POA: Diagnosis not present

## 2016-08-13 DIAGNOSIS — I130 Hypertensive heart and chronic kidney disease with heart failure and stage 1 through stage 4 chronic kidney disease, or unspecified chronic kidney disease: Secondary | ICD-10-CM | POA: Diagnosis not present

## 2016-08-13 DIAGNOSIS — Z7901 Long term (current) use of anticoagulants: Secondary | ICD-10-CM | POA: Diagnosis not present

## 2016-08-13 DIAGNOSIS — Z6841 Body Mass Index (BMI) 40.0 and over, adult: Secondary | ICD-10-CM | POA: Diagnosis not present

## 2016-08-13 DIAGNOSIS — I480 Paroxysmal atrial fibrillation: Secondary | ICD-10-CM | POA: Diagnosis not present

## 2016-08-13 DIAGNOSIS — I5042 Chronic combined systolic (congestive) and diastolic (congestive) heart failure: Secondary | ICD-10-CM | POA: Diagnosis not present

## 2016-09-24 DIAGNOSIS — Z7901 Long term (current) use of anticoagulants: Secondary | ICD-10-CM | POA: Diagnosis not present

## 2016-09-24 DIAGNOSIS — R627 Adult failure to thrive: Secondary | ICD-10-CM | POA: Diagnosis not present

## 2016-09-24 DIAGNOSIS — I480 Paroxysmal atrial fibrillation: Secondary | ICD-10-CM | POA: Diagnosis not present

## 2016-09-24 DIAGNOSIS — I130 Hypertensive heart and chronic kidney disease with heart failure and stage 1 through stage 4 chronic kidney disease, or unspecified chronic kidney disease: Secondary | ICD-10-CM | POA: Diagnosis not present

## 2016-09-24 DIAGNOSIS — I5042 Chronic combined systolic (congestive) and diastolic (congestive) heart failure: Secondary | ICD-10-CM | POA: Diagnosis not present

## 2016-09-24 DIAGNOSIS — E1151 Type 2 diabetes mellitus with diabetic peripheral angiopathy without gangrene: Secondary | ICD-10-CM | POA: Diagnosis not present

## 2016-09-24 DIAGNOSIS — I878 Other specified disorders of veins: Secondary | ICD-10-CM | POA: Diagnosis not present

## 2016-09-24 DIAGNOSIS — Z794 Long term (current) use of insulin: Secondary | ICD-10-CM | POA: Diagnosis not present

## 2016-10-29 DIAGNOSIS — I480 Paroxysmal atrial fibrillation: Secondary | ICD-10-CM | POA: Diagnosis not present

## 2016-10-29 DIAGNOSIS — Z7901 Long term (current) use of anticoagulants: Secondary | ICD-10-CM | POA: Diagnosis not present

## 2016-11-26 DIAGNOSIS — I480 Paroxysmal atrial fibrillation: Secondary | ICD-10-CM | POA: Diagnosis not present

## 2016-11-26 DIAGNOSIS — Z7901 Long term (current) use of anticoagulants: Secondary | ICD-10-CM | POA: Diagnosis not present

## 2017-01-07 DIAGNOSIS — Z6841 Body Mass Index (BMI) 40.0 and over, adult: Secondary | ICD-10-CM | POA: Diagnosis not present

## 2017-01-07 DIAGNOSIS — E1151 Type 2 diabetes mellitus with diabetic peripheral angiopathy without gangrene: Secondary | ICD-10-CM | POA: Diagnosis not present

## 2017-01-07 DIAGNOSIS — Z794 Long term (current) use of insulin: Secondary | ICD-10-CM | POA: Diagnosis not present

## 2017-01-07 DIAGNOSIS — I5042 Chronic combined systolic (congestive) and diastolic (congestive) heart failure: Secondary | ICD-10-CM | POA: Diagnosis not present

## 2017-01-07 DIAGNOSIS — I480 Paroxysmal atrial fibrillation: Secondary | ICD-10-CM | POA: Diagnosis not present

## 2017-01-07 DIAGNOSIS — R627 Adult failure to thrive: Secondary | ICD-10-CM | POA: Diagnosis not present

## 2017-01-07 DIAGNOSIS — I130 Hypertensive heart and chronic kidney disease with heart failure and stage 1 through stage 4 chronic kidney disease, or unspecified chronic kidney disease: Secondary | ICD-10-CM | POA: Diagnosis not present

## 2017-01-07 DIAGNOSIS — Z7901 Long term (current) use of anticoagulants: Secondary | ICD-10-CM | POA: Diagnosis not present

## 2017-02-09 DIAGNOSIS — Z7901 Long term (current) use of anticoagulants: Secondary | ICD-10-CM | POA: Diagnosis not present

## 2017-02-09 DIAGNOSIS — I480 Paroxysmal atrial fibrillation: Secondary | ICD-10-CM | POA: Diagnosis not present

## 2017-03-18 DIAGNOSIS — Z7901 Long term (current) use of anticoagulants: Secondary | ICD-10-CM | POA: Diagnosis not present

## 2017-03-18 DIAGNOSIS — I480 Paroxysmal atrial fibrillation: Secondary | ICD-10-CM | POA: Diagnosis not present

## 2017-04-15 DIAGNOSIS — I480 Paroxysmal atrial fibrillation: Secondary | ICD-10-CM | POA: Diagnosis not present

## 2017-04-15 DIAGNOSIS — Z7901 Long term (current) use of anticoagulants: Secondary | ICD-10-CM | POA: Diagnosis not present

## 2017-05-20 DIAGNOSIS — I480 Paroxysmal atrial fibrillation: Secondary | ICD-10-CM | POA: Diagnosis not present

## 2017-05-20 DIAGNOSIS — Z7901 Long term (current) use of anticoagulants: Secondary | ICD-10-CM | POA: Diagnosis not present

## 2017-05-20 DIAGNOSIS — Z6841 Body Mass Index (BMI) 40.0 and over, adult: Secondary | ICD-10-CM | POA: Diagnosis not present

## 2017-06-10 DIAGNOSIS — I480 Paroxysmal atrial fibrillation: Secondary | ICD-10-CM | POA: Diagnosis not present

## 2017-06-10 DIAGNOSIS — I130 Hypertensive heart and chronic kidney disease with heart failure and stage 1 through stage 4 chronic kidney disease, or unspecified chronic kidney disease: Secondary | ICD-10-CM | POA: Diagnosis not present

## 2017-06-10 DIAGNOSIS — I5042 Chronic combined systolic (congestive) and diastolic (congestive) heart failure: Secondary | ICD-10-CM | POA: Diagnosis not present

## 2017-06-10 DIAGNOSIS — E1151 Type 2 diabetes mellitus with diabetic peripheral angiopathy without gangrene: Secondary | ICD-10-CM | POA: Diagnosis not present

## 2017-06-10 DIAGNOSIS — Z794 Long term (current) use of insulin: Secondary | ICD-10-CM | POA: Diagnosis not present

## 2017-06-10 DIAGNOSIS — I2721 Secondary pulmonary arterial hypertension: Secondary | ICD-10-CM | POA: Diagnosis not present

## 2017-06-10 DIAGNOSIS — Z7901 Long term (current) use of anticoagulants: Secondary | ICD-10-CM | POA: Diagnosis not present

## 2017-06-10 DIAGNOSIS — G4733 Obstructive sleep apnea (adult) (pediatric): Secondary | ICD-10-CM | POA: Diagnosis not present

## 2017-06-15 DIAGNOSIS — E1165 Type 2 diabetes mellitus with hyperglycemia: Secondary | ICD-10-CM | POA: Diagnosis not present

## 2017-06-15 DIAGNOSIS — N183 Chronic kidney disease, stage 3 (moderate): Secondary | ICD-10-CM | POA: Diagnosis not present

## 2017-06-15 DIAGNOSIS — Z6841 Body Mass Index (BMI) 40.0 and over, adult: Secondary | ICD-10-CM | POA: Diagnosis not present

## 2017-06-15 DIAGNOSIS — Z794 Long term (current) use of insulin: Secondary | ICD-10-CM | POA: Diagnosis not present

## 2017-06-15 DIAGNOSIS — I10 Essential (primary) hypertension: Secondary | ICD-10-CM | POA: Diagnosis not present

## 2017-07-22 DIAGNOSIS — I480 Paroxysmal atrial fibrillation: Secondary | ICD-10-CM | POA: Diagnosis not present

## 2017-07-22 DIAGNOSIS — Z7901 Long term (current) use of anticoagulants: Secondary | ICD-10-CM | POA: Diagnosis not present

## 2017-07-28 DIAGNOSIS — I5042 Chronic combined systolic (congestive) and diastolic (congestive) heart failure: Secondary | ICD-10-CM | POA: Diagnosis not present

## 2017-07-28 DIAGNOSIS — I10 Essential (primary) hypertension: Secondary | ICD-10-CM | POA: Diagnosis not present

## 2017-07-28 DIAGNOSIS — N183 Chronic kidney disease, stage 3 (moderate): Secondary | ICD-10-CM | POA: Diagnosis not present

## 2017-07-28 DIAGNOSIS — E1165 Type 2 diabetes mellitus with hyperglycemia: Secondary | ICD-10-CM | POA: Diagnosis not present

## 2017-07-28 DIAGNOSIS — Z794 Long term (current) use of insulin: Secondary | ICD-10-CM | POA: Diagnosis not present

## 2017-08-19 DIAGNOSIS — Z7901 Long term (current) use of anticoagulants: Secondary | ICD-10-CM | POA: Diagnosis not present

## 2017-08-19 DIAGNOSIS — I480 Paroxysmal atrial fibrillation: Secondary | ICD-10-CM | POA: Diagnosis not present

## 2017-09-14 DIAGNOSIS — E1151 Type 2 diabetes mellitus with diabetic peripheral angiopathy without gangrene: Secondary | ICD-10-CM | POA: Diagnosis not present

## 2017-09-14 DIAGNOSIS — M109 Gout, unspecified: Secondary | ICD-10-CM | POA: Diagnosis not present

## 2017-09-14 DIAGNOSIS — I480 Paroxysmal atrial fibrillation: Secondary | ICD-10-CM | POA: Diagnosis not present

## 2017-09-14 DIAGNOSIS — Z23 Encounter for immunization: Secondary | ICD-10-CM | POA: Diagnosis not present

## 2017-09-14 DIAGNOSIS — E1165 Type 2 diabetes mellitus with hyperglycemia: Secondary | ICD-10-CM | POA: Diagnosis not present

## 2017-09-14 DIAGNOSIS — I130 Hypertensive heart and chronic kidney disease with heart failure and stage 1 through stage 4 chronic kidney disease, or unspecified chronic kidney disease: Secondary | ICD-10-CM | POA: Diagnosis not present

## 2017-09-14 DIAGNOSIS — Z794 Long term (current) use of insulin: Secondary | ICD-10-CM | POA: Diagnosis not present

## 2017-09-14 DIAGNOSIS — R627 Adult failure to thrive: Secondary | ICD-10-CM | POA: Diagnosis not present

## 2017-09-14 DIAGNOSIS — I5042 Chronic combined systolic (congestive) and diastolic (congestive) heart failure: Secondary | ICD-10-CM | POA: Diagnosis not present

## 2017-09-19 DIAGNOSIS — E1165 Type 2 diabetes mellitus with hyperglycemia: Secondary | ICD-10-CM | POA: Diagnosis not present

## 2017-10-14 DIAGNOSIS — I480 Paroxysmal atrial fibrillation: Secondary | ICD-10-CM | POA: Diagnosis not present

## 2017-10-14 DIAGNOSIS — Z7901 Long term (current) use of anticoagulants: Secondary | ICD-10-CM | POA: Diagnosis not present

## 2017-11-04 DIAGNOSIS — Z7901 Long term (current) use of anticoagulants: Secondary | ICD-10-CM | POA: Diagnosis not present

## 2017-11-04 DIAGNOSIS — I480 Paroxysmal atrial fibrillation: Secondary | ICD-10-CM | POA: Diagnosis not present

## 2017-12-09 DIAGNOSIS — Z7901 Long term (current) use of anticoagulants: Secondary | ICD-10-CM | POA: Diagnosis not present

## 2017-12-09 DIAGNOSIS — I480 Paroxysmal atrial fibrillation: Secondary | ICD-10-CM | POA: Diagnosis not present

## 2018-01-13 DIAGNOSIS — Z7901 Long term (current) use of anticoagulants: Secondary | ICD-10-CM | POA: Diagnosis not present

## 2018-01-13 DIAGNOSIS — I480 Paroxysmal atrial fibrillation: Secondary | ICD-10-CM | POA: Diagnosis not present

## 2018-02-10 DIAGNOSIS — I480 Paroxysmal atrial fibrillation: Secondary | ICD-10-CM | POA: Diagnosis not present

## 2018-02-10 DIAGNOSIS — Z7901 Long term (current) use of anticoagulants: Secondary | ICD-10-CM | POA: Diagnosis not present

## 2018-02-21 DIAGNOSIS — E1165 Type 2 diabetes mellitus with hyperglycemia: Secondary | ICD-10-CM | POA: Diagnosis not present

## 2018-02-21 DIAGNOSIS — Z794 Long term (current) use of insulin: Secondary | ICD-10-CM | POA: Diagnosis not present

## 2018-02-21 DIAGNOSIS — I5042 Chronic combined systolic (congestive) and diastolic (congestive) heart failure: Secondary | ICD-10-CM | POA: Diagnosis not present

## 2018-02-21 DIAGNOSIS — Z23 Encounter for immunization: Secondary | ICD-10-CM | POA: Diagnosis not present

## 2018-02-21 DIAGNOSIS — F418 Other specified anxiety disorders: Secondary | ICD-10-CM | POA: Diagnosis not present

## 2018-02-21 DIAGNOSIS — I480 Paroxysmal atrial fibrillation: Secondary | ICD-10-CM | POA: Diagnosis not present

## 2018-02-21 DIAGNOSIS — I130 Hypertensive heart and chronic kidney disease with heart failure and stage 1 through stage 4 chronic kidney disease, or unspecified chronic kidney disease: Secondary | ICD-10-CM | POA: Diagnosis not present

## 2018-02-21 DIAGNOSIS — E1151 Type 2 diabetes mellitus with diabetic peripheral angiopathy without gangrene: Secondary | ICD-10-CM | POA: Diagnosis not present

## 2018-02-21 DIAGNOSIS — N183 Chronic kidney disease, stage 3 (moderate): Secondary | ICD-10-CM | POA: Diagnosis not present

## 2018-03-10 DIAGNOSIS — Z7901 Long term (current) use of anticoagulants: Secondary | ICD-10-CM | POA: Diagnosis not present

## 2018-03-10 DIAGNOSIS — I480 Paroxysmal atrial fibrillation: Secondary | ICD-10-CM | POA: Diagnosis not present

## 2018-03-10 DIAGNOSIS — Z6841 Body Mass Index (BMI) 40.0 and over, adult: Secondary | ICD-10-CM | POA: Diagnosis not present

## 2018-04-14 DIAGNOSIS — Z7901 Long term (current) use of anticoagulants: Secondary | ICD-10-CM | POA: Diagnosis not present

## 2018-04-14 DIAGNOSIS — I480 Paroxysmal atrial fibrillation: Secondary | ICD-10-CM | POA: Diagnosis not present

## 2018-04-14 DIAGNOSIS — Z6841 Body Mass Index (BMI) 40.0 and over, adult: Secondary | ICD-10-CM | POA: Diagnosis not present

## 2018-05-12 DIAGNOSIS — I480 Paroxysmal atrial fibrillation: Secondary | ICD-10-CM | POA: Diagnosis not present

## 2018-05-12 DIAGNOSIS — Z6841 Body Mass Index (BMI) 40.0 and over, adult: Secondary | ICD-10-CM | POA: Diagnosis not present

## 2018-05-12 DIAGNOSIS — Z7901 Long term (current) use of anticoagulants: Secondary | ICD-10-CM | POA: Diagnosis not present

## 2018-06-09 DIAGNOSIS — I878 Other specified disorders of veins: Secondary | ICD-10-CM | POA: Diagnosis not present

## 2018-06-09 DIAGNOSIS — Z7901 Long term (current) use of anticoagulants: Secondary | ICD-10-CM | POA: Diagnosis not present

## 2018-06-09 DIAGNOSIS — E1165 Type 2 diabetes mellitus with hyperglycemia: Secondary | ICD-10-CM | POA: Diagnosis not present

## 2018-06-09 DIAGNOSIS — N183 Chronic kidney disease, stage 3 (moderate): Secondary | ICD-10-CM | POA: Diagnosis not present

## 2018-06-09 DIAGNOSIS — I5042 Chronic combined systolic (congestive) and diastolic (congestive) heart failure: Secondary | ICD-10-CM | POA: Diagnosis not present

## 2018-06-09 DIAGNOSIS — I130 Hypertensive heart and chronic kidney disease with heart failure and stage 1 through stage 4 chronic kidney disease, or unspecified chronic kidney disease: Secondary | ICD-10-CM | POA: Diagnosis not present

## 2018-06-09 DIAGNOSIS — F418 Other specified anxiety disorders: Secondary | ICD-10-CM | POA: Diagnosis not present

## 2018-06-09 DIAGNOSIS — I480 Paroxysmal atrial fibrillation: Secondary | ICD-10-CM | POA: Diagnosis not present

## 2018-06-09 DIAGNOSIS — R609 Edema, unspecified: Secondary | ICD-10-CM | POA: Diagnosis not present

## 2018-07-14 DIAGNOSIS — Z7901 Long term (current) use of anticoagulants: Secondary | ICD-10-CM | POA: Diagnosis not present

## 2018-07-14 DIAGNOSIS — I480 Paroxysmal atrial fibrillation: Secondary | ICD-10-CM | POA: Diagnosis not present

## 2018-08-11 DIAGNOSIS — Z7901 Long term (current) use of anticoagulants: Secondary | ICD-10-CM | POA: Diagnosis not present

## 2018-08-11 DIAGNOSIS — I480 Paroxysmal atrial fibrillation: Secondary | ICD-10-CM | POA: Diagnosis not present

## 2018-08-18 DIAGNOSIS — I480 Paroxysmal atrial fibrillation: Secondary | ICD-10-CM | POA: Diagnosis not present

## 2018-08-18 DIAGNOSIS — Z7901 Long term (current) use of anticoagulants: Secondary | ICD-10-CM | POA: Diagnosis not present

## 2018-10-20 DIAGNOSIS — I480 Paroxysmal atrial fibrillation: Secondary | ICD-10-CM | POA: Diagnosis not present

## 2018-10-20 DIAGNOSIS — Z7901 Long term (current) use of anticoagulants: Secondary | ICD-10-CM | POA: Diagnosis not present

## 2018-11-17 DIAGNOSIS — I480 Paroxysmal atrial fibrillation: Secondary | ICD-10-CM | POA: Diagnosis not present

## 2018-11-17 DIAGNOSIS — Z7901 Long term (current) use of anticoagulants: Secondary | ICD-10-CM | POA: Diagnosis not present

## 2018-11-24 DIAGNOSIS — N183 Chronic kidney disease, stage 3 (moderate): Secondary | ICD-10-CM | POA: Diagnosis not present

## 2018-11-24 DIAGNOSIS — R82998 Other abnormal findings in urine: Secondary | ICD-10-CM | POA: Diagnosis not present

## 2018-11-24 DIAGNOSIS — E1165 Type 2 diabetes mellitus with hyperglycemia: Secondary | ICD-10-CM | POA: Diagnosis not present

## 2018-11-24 DIAGNOSIS — I130 Hypertensive heart and chronic kidney disease with heart failure and stage 1 through stage 4 chronic kidney disease, or unspecified chronic kidney disease: Secondary | ICD-10-CM | POA: Diagnosis not present

## 2018-12-05 ENCOUNTER — Inpatient Hospital Stay (HOSPITAL_COMMUNITY)
Admission: EM | Admit: 2018-12-05 | Discharge: 2018-12-14 | DRG: 286 | Disposition: A | Discharge status: Home / Self Care | Payer: Medicare HMO | Attending: Internal Medicine | Admitting: Internal Medicine

## 2018-12-05 ENCOUNTER — Encounter (HOSPITAL_COMMUNITY): Payer: Self-pay | Admitting: Emergency Medicine

## 2018-12-05 ENCOUNTER — Other Ambulatory Visit: Payer: Self-pay

## 2018-12-05 ENCOUNTER — Emergency Department (HOSPITAL_COMMUNITY): Payer: Medicare HMO

## 2018-12-05 DIAGNOSIS — I130 Hypertensive heart and chronic kidney disease with heart failure and stage 1 through stage 4 chronic kidney disease, or unspecified chronic kidney disease: Principal | ICD-10-CM | POA: Diagnosis present

## 2018-12-05 DIAGNOSIS — Z9989 Dependence on other enabling machines and devices: Secondary | ICD-10-CM

## 2018-12-05 DIAGNOSIS — I4821 Permanent atrial fibrillation: Secondary | ICD-10-CM | POA: Diagnosis present

## 2018-12-05 DIAGNOSIS — I440 Atrioventricular block, first degree: Secondary | ICD-10-CM | POA: Diagnosis present

## 2018-12-05 DIAGNOSIS — Z9119 Patient's noncompliance with other medical treatment and regimen: Secondary | ICD-10-CM

## 2018-12-05 DIAGNOSIS — I5043 Acute on chronic combined systolic (congestive) and diastolic (congestive) heart failure: Secondary | ICD-10-CM | POA: Diagnosis present

## 2018-12-05 DIAGNOSIS — R0602 Shortness of breath: Secondary | ICD-10-CM | POA: Diagnosis not present

## 2018-12-05 DIAGNOSIS — M109 Gout, unspecified: Secondary | ICD-10-CM | POA: Diagnosis present

## 2018-12-05 DIAGNOSIS — E1165 Type 2 diabetes mellitus with hyperglycemia: Secondary | ICD-10-CM | POA: Diagnosis present

## 2018-12-05 DIAGNOSIS — I5023 Acute on chronic systolic (congestive) heart failure: Secondary | ICD-10-CM | POA: Diagnosis not present

## 2018-12-05 DIAGNOSIS — I878 Other specified disorders of veins: Secondary | ICD-10-CM | POA: Diagnosis present

## 2018-12-05 DIAGNOSIS — N179 Acute kidney failure, unspecified: Secondary | ICD-10-CM | POA: Diagnosis not present

## 2018-12-05 DIAGNOSIS — E785 Hyperlipidemia, unspecified: Secondary | ICD-10-CM | POA: Diagnosis present

## 2018-12-05 DIAGNOSIS — I959 Hypotension, unspecified: Secondary | ICD-10-CM | POA: Diagnosis not present

## 2018-12-05 DIAGNOSIS — I2510 Atherosclerotic heart disease of native coronary artery without angina pectoris: Secondary | ICD-10-CM | POA: Diagnosis present

## 2018-12-05 DIAGNOSIS — Z79899 Other long term (current) drug therapy: Secondary | ICD-10-CM

## 2018-12-05 DIAGNOSIS — Z947 Corneal transplant status: Secondary | ICD-10-CM

## 2018-12-05 DIAGNOSIS — I428 Other cardiomyopathies: Secondary | ICD-10-CM | POA: Diagnosis present

## 2018-12-05 DIAGNOSIS — Z7901 Long term (current) use of anticoagulants: Secondary | ICD-10-CM

## 2018-12-05 DIAGNOSIS — R791 Abnormal coagulation profile: Secondary | ICD-10-CM | POA: Diagnosis not present

## 2018-12-05 DIAGNOSIS — D509 Iron deficiency anemia, unspecified: Secondary | ICD-10-CM | POA: Diagnosis present

## 2018-12-05 DIAGNOSIS — J9601 Acute respiratory failure with hypoxia: Secondary | ICD-10-CM | POA: Diagnosis not present

## 2018-12-05 DIAGNOSIS — I5021 Acute systolic (congestive) heart failure: Secondary | ICD-10-CM | POA: Diagnosis not present

## 2018-12-05 DIAGNOSIS — G4733 Obstructive sleep apnea (adult) (pediatric): Secondary | ICD-10-CM | POA: Diagnosis present

## 2018-12-05 DIAGNOSIS — I4891 Unspecified atrial fibrillation: Secondary | ICD-10-CM | POA: Diagnosis not present

## 2018-12-05 DIAGNOSIS — D649 Anemia, unspecified: Secondary | ICD-10-CM

## 2018-12-05 DIAGNOSIS — Z808 Family history of malignant neoplasm of other organs or systems: Secondary | ICD-10-CM

## 2018-12-05 DIAGNOSIS — E871 Hypo-osmolality and hyponatremia: Secondary | ICD-10-CM | POA: Diagnosis not present

## 2018-12-05 DIAGNOSIS — E1169 Type 2 diabetes mellitus with other specified complication: Secondary | ICD-10-CM | POA: Diagnosis present

## 2018-12-05 DIAGNOSIS — N183 Chronic kidney disease, stage 3 unspecified: Secondary | ICD-10-CM | POA: Diagnosis present

## 2018-12-05 DIAGNOSIS — Z6841 Body Mass Index (BMI) 40.0 and over, adult: Secondary | ICD-10-CM

## 2018-12-05 DIAGNOSIS — I5082 Biventricular heart failure: Secondary | ICD-10-CM | POA: Diagnosis present

## 2018-12-05 DIAGNOSIS — I10 Essential (primary) hypertension: Secondary | ICD-10-CM | POA: Diagnosis not present

## 2018-12-05 DIAGNOSIS — Z66 Do not resuscitate: Secondary | ICD-10-CM | POA: Diagnosis not present

## 2018-12-05 DIAGNOSIS — D638 Anemia in other chronic diseases classified elsewhere: Secondary | ICD-10-CM | POA: Diagnosis present

## 2018-12-05 DIAGNOSIS — D631 Anemia in chronic kidney disease: Secondary | ICD-10-CM | POA: Diagnosis present

## 2018-12-05 DIAGNOSIS — E875 Hyperkalemia: Secondary | ICD-10-CM | POA: Diagnosis not present

## 2018-12-05 DIAGNOSIS — Z7982 Long term (current) use of aspirin: Secondary | ICD-10-CM

## 2018-12-05 DIAGNOSIS — I480 Paroxysmal atrial fibrillation: Secondary | ICD-10-CM | POA: Diagnosis not present

## 2018-12-05 DIAGNOSIS — E1122 Type 2 diabetes mellitus with diabetic chronic kidney disease: Secondary | ICD-10-CM | POA: Diagnosis present

## 2018-12-05 DIAGNOSIS — I509 Heart failure, unspecified: Secondary | ICD-10-CM | POA: Diagnosis not present

## 2018-12-05 DIAGNOSIS — Z9114 Patient's other noncompliance with medication regimen: Secondary | ICD-10-CM

## 2018-12-05 DIAGNOSIS — I272 Pulmonary hypertension, unspecified: Secondary | ICD-10-CM | POA: Diagnosis present

## 2018-12-05 DIAGNOSIS — Z20828 Contact with and (suspected) exposure to other viral communicable diseases: Secondary | ICD-10-CM | POA: Diagnosis present

## 2018-12-05 DIAGNOSIS — G8929 Other chronic pain: Secondary | ICD-10-CM | POA: Diagnosis present

## 2018-12-05 DIAGNOSIS — Z794 Long term (current) use of insulin: Secondary | ICD-10-CM

## 2018-12-05 DIAGNOSIS — Z79891 Long term (current) use of opiate analgesic: Secondary | ICD-10-CM

## 2018-12-05 LAB — HEPATIC FUNCTION PANEL
ALT: 28 U/L (ref 0–44)
AST: 23 U/L (ref 15–41)
Albumin: 3.1 g/dL — ABNORMAL LOW (ref 3.5–5.0)
Alkaline Phosphatase: 69 U/L (ref 38–126)
Bilirubin, Direct: 0.1 mg/dL (ref 0.0–0.2)
Indirect Bilirubin: 0.6 mg/dL (ref 0.3–0.9)
Total Bilirubin: 0.7 mg/dL (ref 0.3–1.2)
Total Protein: 6.9 g/dL (ref 6.5–8.1)

## 2018-12-05 LAB — CBC
HCT: 35.1 % — ABNORMAL LOW (ref 39.0–52.0)
Hemoglobin: 10.3 g/dL — ABNORMAL LOW (ref 13.0–17.0)
MCH: 26.4 pg (ref 26.0–34.0)
MCHC: 29.3 g/dL — ABNORMAL LOW (ref 30.0–36.0)
MCV: 90 fL (ref 80.0–100.0)
Platelets: 388 10*3/uL (ref 150–400)
RBC: 3.9 MIL/uL — ABNORMAL LOW (ref 4.22–5.81)
RDW: 17.5 % — ABNORMAL HIGH (ref 11.5–15.5)
WBC: 9.9 10*3/uL (ref 4.0–10.5)
nRBC: 0 % (ref 0.0–0.2)

## 2018-12-05 LAB — BASIC METABOLIC PANEL
Anion gap: 12 (ref 5–15)
BUN: 22 mg/dL (ref 8–23)
CO2: 28 mmol/L (ref 22–32)
Calcium: 8.4 mg/dL — ABNORMAL LOW (ref 8.9–10.3)
Chloride: 99 mmol/L (ref 98–111)
Creatinine, Ser: 1.33 mg/dL — ABNORMAL HIGH (ref 0.61–1.24)
GFR calc Af Amer: >60 mL/min (ref >60)
GFR calc non Af Amer: 55 mL/min — ABNORMAL LOW (ref >60)
Glucose, Bld: 206 mg/dL — ABNORMAL HIGH (ref 70–99)
Potassium: 4 mmol/L (ref 3.5–5.1)
Sodium: 139 mmol/L (ref 135–145)

## 2018-12-05 LAB — TROPONIN I (HIGH SENSITIVITY)
Troponin I (High Sensitivity): 19 ng/L — ABNORMAL HIGH (ref <18)
Troponin I (High Sensitivity): 19 ng/L — ABNORMAL HIGH (ref <18)

## 2018-12-05 LAB — PROTIME-INR
INR: 2.6 — ABNORMAL HIGH (ref 0.8–1.2)
Prothrombin Time: 27.1 seconds — ABNORMAL HIGH (ref 11.4–15.2)

## 2018-12-05 LAB — BRAIN NATRIURETIC PEPTIDE: B Natriuretic Peptide: 111.3 pg/mL — ABNORMAL HIGH (ref 0.0–100.0)

## 2018-12-05 MED ORDER — WARFARIN - PHARMACIST DOSING INPATIENT
Freq: Every day | Status: DC
  Administered 2018-12-06: 18:00:00

## 2018-12-05 MED ORDER — FUROSEMIDE 10 MG/ML IJ SOLN
60.0000 mg | Freq: Once | INTRAMUSCULAR | Status: AC
  Administered 2018-12-05: 60 mg via INTRAVENOUS
  Filled 2018-12-05: qty 6

## 2018-12-05 MED ORDER — SODIUM CHLORIDE 0.9% FLUSH
3.0000 mL | Freq: Once | INTRAVENOUS | Status: AC
  Administered 2018-12-05: 3 mL via INTRAVENOUS

## 2018-12-05 MED ORDER — FUROSEMIDE 10 MG/ML IJ SOLN
80.0000 mg | Freq: Two times a day (BID) | INTRAMUSCULAR | Status: DC
  Administered 2018-12-06 – 2018-12-07 (×3): 80 mg via INTRAVENOUS
  Filled 2018-12-05 (×3): qty 8
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
## 2018-12-05 NOTE — ED Triage Notes (Signed)
Pt states for the last 1 week he has had sob that has gradually gotten worse. Pt denies in CP.

## 2018-12-05 NOTE — ED Provider Notes (Signed)
[facility]GENC[facility] DEPARTMENT Provider Note   CSN: [id number] Arrival date & time: 12/05/18  1336     History   Chief Complaint Chief Complaint  Patient presents with   Shortness of Breath    HPI Raymond Pratt is a 68 y.o. male.     HPI   Raymond Pratt is a 68 y.o. male, with a history of CHF, DM, CAD, HTN, obesity, presenting to the ED with shortness of breath worsening for about the last two weeks at rest and with exertion. He suspects he is fluid overloaded.  He has had some increased orthopnea and a feeling of lung congestion. He adds he is supposed to be taking metolazone in addition to his torsemide, however, the prescription co-pay for metolazone increased 2 months ago and he has not been able to afford to fill this medication.  Denies fever/chills, chest pain, abdominal pain, N/V/D, acute lower extremity edema/pain, urinary symptoms, or any other complaints.    Past Medical History:  Diagnosis Date   Arthritis    "touch in my fingers" (02/28/2012)   CAD (coronary artery disease)    non-obstructive by LHC 12.2013:  pRCA 30%   CELLULITIS, LEGS 08/04/2008   Qualifier: Diagnosis of  By: Assunta Found MD, Annie Main     Chronic combined systolic and diastolic CHF (congestive heart failure) ([place])    Complication of anesthesia    "ether made me sick to my stomach" (02/28/2012)   DIABETES MELLITUS, UNCONTROLLED 08/04/2008   Qualifier: Diagnosis of  By: Assunta Found MD, Rober Minion injury    NAIL GUN   HTN (hypertension)    Hx of echocardiogram    a. Echo 02/28/2012: EF 20-25%, mild MR, mild LAE, mod RVE, PASP 46;  b.  Echo (11/14):  EF 20-25%, diff Hk, Tr AI, MAC, mild to mod MR, mod LAE, mild RVE, mod RAE, PASP 45   Medical history non-contributory    NICM (nonischemic cardiomyopathy) (HCC)    OSA on CPAP    Permanent atrial fibrillation 08/04/2008   Qualifier: Diagnosis of  By: Assunta Found MD, Stephen  ; failed DCCV/notes 02/28/2012   UTI  08/04/2008   Qualifier: Diagnosis of  By: Assunta Found MD, Annie Main      Patient Active Problem List   Diagnosis Date Noted   CHF (congestive heart failure) ([place]) 12/05/2018   Anemia    Gram-negative bacteremia 02/12/2014   Chills (without fever) 02/11/2014   Acute liver failure 12/28/2013   Acute on chronic heart failure ([place]) 12/28/2013   A-fib ([place]) 03/07/2013   HTN (hypertension) 02/16/2013   Chronic combined systolic and diastolic heart failure ([place]) 02/01/2013   Anticoagulated on Coumadin 01/15/2013   Hyposmolality and/or hyponatremia 03/04/2012   OSA on CPAP 03/04/2012   Acute on chronic combined systolic and diastolic heart failure, NYHA class 4 ([place]) 02/28/2012   Chest pressure 02/28/2012   Type 2 diabetes mellitus with hyperlipidemia ([place]) 08/04/2008   ATRIAL FIBRILLATION WITH RAPID VENTRICULAR RESPONSE 08/04/2008   UTI 08/04/2008   CELLULITIS, LEGS 08/04/2008   OTHER ASCITES 08/04/2008    Past Surgical History:  Procedure Laterality Date   CARDIOVERSION     failed   CARDIOVERSION N/A 02/14/2014   Procedure: CARDIOVERSION;  Surgeon: Larey Dresser, MD;  Location: [facility] ENDOSCOPY;  Service: Cardiovascular;  Laterality: N/A;   CORNEAL TRANSPLANT     "left eye" (02/28/2012)   EYE MUSCLE SURGERY     "left eye" (02/28/2012)   EYE SURGERY  2007   "  got nail go in; had to do 5-6 ORs total" (02/28/2012)   LEFT AND RIGHT HEART CATHETERIZATION WITH CORONARY ANGIOGRAM N/A 03/06/2012   Procedure: LEFT AND RIGHT HEART CATHETERIZATION WITH CORONARY ANGIOGRAM;  Surgeon: Larey Dresser, MD;  Location: [facility] CATH LAB;  Service: Cardiovascular;  Laterality: N/A;   PERIPHERALLY INSERTED CENTRAL CATHETER INSERTION  02/28/2012   PLACEMENT AND SUTURE OF SECONDARY INTRAOCULAR LENS     "left eye" (02/28/2012)   RETINAL DETACHMENT SURGERY     "left eye" (02/28/2012)   TEE WITHOUT CARDIOVERSION N/A 02/14/2014   Procedure: TRANSESOPHAGEAL ECHOCARDIOGRAM (TEE);  Surgeon:  Larey Dresser, MD;  Location: [facility] ENDOSCOPY;  Service: Cardiovascular;  Laterality: N/A;   TONSILLECTOMY     "when I was a kid" (02/28/2012)        Home Medications    Prior to Admission medications   Medication Sig Start Date End Date Taking? Authorizing Provider  amiodarone (PACERONE) 200 MG tablet Amiodarone 200 mg 2 Tabs = 400 mg BID for 1 week and then would decrease to 200 mg BID after Patient taking differently: Take 100 mg by mouth daily.  02/18/14  Yes Shon Baton, MD  Insulin Isophane & Regular Human (NOVOLIN 70/30 FLEXPEN RELION) (70-30) 100 UNIT/ML PEN Inject 20-65 Units into the skin. Inject 65 units into the skin before breakfast and 20 units three times a day before meals   Yes [provider]  spironolactone (ALDACTONE) 25 MG tablet Take 1 tablet (25 mg total) by mouth daily. 02/16/13  Yes Clegg, Amy D, NP  torsemide (DEMADEX) 20 MG tablet Take 3 tablets (60 mg total) by mouth 2 (two) times daily. 02/16/13  Yes Clegg, Amy D, NP  warfarin (COUMADIN) 5 MG tablet Take 5 mg daily Patient taking differently: Take 5-10 mg by mouth daily. Take 5 mg daily 02/18/14  Yes Shon Baton, MD  acetaminophen (TYLENOL) 325 MG tablet Take 650 mg by mouth every 6 (six) hours as needed for mild pain.    [provider]  allopurinol (ZYLOPRIM) 300 MG tablet Take 300 mg by mouth daily.     [provider]  aspirin 81 MG tablet Take 81 mg by mouth daily.    [provider]  cyclobenzaprine (FLEXERIL) 5 MG tablet Take 1 tablet (5 mg total) by mouth 3 (three) times daily as needed for muscle spasms. 10/23/14   Dorie Rank, MD  digoxin (LANOXIN) 0.25 MG tablet Take 0.5 tablets (0.125 mg total) by mouth daily. 01/07/14   Shon Baton, MD  HYDROcodone-acetaminophen (NORCO/VICODIN) 5-325 MG per tablet Take 1 tablet by mouth every 4 (four) hours as needed. 10/23/14   Dorie Rank, MD  insulin NPH Human (HUMULIN N,NOVOLIN N) 100 UNIT/ML injection Inject 20 Units into the skin  2 (two) times daily.     [provider]  lisinopril (PRINIVIL,ZESTRIL) 5 MG tablet Take 5 mg by mouth daily.  10/22/14   [provider]  losartan (COZAAR) 25 MG tablet Take 0.5 tablets (12.5 mg total) by mouth daily. 02/15/14   Shon Baton, MD  metolazone (ZAROXOLYN) 2.5 MG tablet Take 2.5 mg by mouth once a week.  10/10/14   [provider]  metolazone (ZAROXOLYN) 5 MG tablet Take 1 tablet (5 mg total) by mouth once a week. TUESDAYS 03/05/14   Rande Brunt, NP  metoprolol succinate (TOPROL-XL) 50 MG 24 hr tablet Take 1 tablet (50 mg total) by mouth 2 (two) times daily. Take with or immediately following a meal. 01/07/14  Shon Baton, MD  Multiple Vitamins-Minerals (MULTIVITAMIN PO) Take 1 tablet by mouth daily.    [provider]  potassium chloride SA (K-DUR,KLOR-CON) 20 MEQ tablet Take 1 tablet (20 mEq total) by mouth daily. Take an extra tab on Mondays with metolazone 02/18/14   Shon Baton, MD  simvastatin (ZOCOR) 20 MG tablet Take 1 tablet (20 mg total) by mouth daily at 6 PM. 03/08/12   Lendon Colonel, NP  sitaGLIPtin (JANUVIA) 100 MG tablet Take 100 mg by mouth daily.    [provider]    Family History Family History  Problem Relation Age of Onset   Cancer Mother        brain tumor    Social History Social History   Tobacco Use   Smoking status: Never Smoker   Smokeless tobacco: Never Used  Substance Use Topics   Alcohol use: No   Drug use: No     Allergies   Actos [pioglitazone]   Review of Systems Review of Systems  Constitutional: Negative for chills, diaphoresis and fever.  Respiratory: Positive for shortness of breath.   Cardiovascular: Negative for chest pain and leg swelling.  Gastrointestinal: Negative for abdominal pain, diarrhea, nausea and vomiting.  Genitourinary: Negative for difficulty urinating, dysuria, flank pain and hematuria.  Musculoskeletal: Negative for back pain.  Neurological: Negative  for dizziness, syncope, weakness and light-headedness.  All other systems reviewed and are negative.    Physical Exam Updated Vital Signs BP 121/78 (BP Location: Right Arm)    Pulse 75    Temp 98.7 F (37.1 C) (Oral)    Resp 18    SpO2 95%   Physical Exam Vitals signs and nursing note reviewed.  Constitutional:      General: He is not in acute distress.    Appearance: He is well-developed. He is obese. He is not diaphoretic.  HENT:     Head: Normocephalic and atraumatic.     Mouth/Throat:     Mouth: Mucous membranes are moist.     Pharynx: Oropharynx is clear.  Eyes:     Conjunctiva/sclera: Conjunctivae normal.  Neck:     Musculoskeletal: Neck supple.  Cardiovascular:     Rate and Rhythm: Normal rate and regular rhythm.     Pulses: Normal pulses.          Radial pulses are 2+ on the right side and 2+ on the left side.       Posterior tibial pulses are 2+ on the right side and 2+ on the left side.     Heart sounds: Normal heart sounds.     Comments: Tactile temperature in the extremities appropriate and equal bilaterally. Pulmonary:     Breath sounds: Decreased breath sounds present.     Comments: Some conversational dyspnea with extended conversation. Abdominal:     Palpations: Abdomen is soft.     Tenderness: There is no abdominal tenderness. There is no guarding.  Musculoskeletal:     Right lower leg: No edema.     Left lower leg: No edema.  Lymphadenopathy:     Cervical: No cervical adenopathy.  Skin:    General: Skin is warm and dry.  Neurological:     Mental Status: He is alert.  Psychiatric:        Mood and Affect: Mood and affect normal.        Speech: Speech normal.        Behavior: Behavior normal.      ED Treatments / Results  Labs (all labs ordered are listed, but only abnormal results are displayed) Labs Reviewed  BASIC METABOLIC PANEL - Abnormal; Notable for the following components:      Result Value   Glucose, Bld 206 (*)    Creatinine, Ser  1.33 (*)    Calcium 8.4 (*)    GFR calc non Af Amer 55 (*)    All other components within normal limits  CBC - Abnormal; Notable for the following components:   RBC 3.90 (*)    Hemoglobin 10.3 (*)    HCT 35.1 (*)    MCHC 29.3 (*)    RDW 17.5 (*)    All other components within normal limits  PROTIME-INR - Abnormal; Notable for the following components:   Prothrombin Time 27.1 (*)    INR 2.6 (*)    All other components within normal limits  BRAIN NATRIURETIC PEPTIDE - Abnormal; Notable for the following components:   B Natriuretic Peptide 111.3 (*)    All other components within normal limits  HEPATIC FUNCTION PANEL - Abnormal; Notable for the following components:   Albumin 3.1 (*)    All other components within normal limits  TROPONIN I (HIGH SENSITIVITY) - Abnormal; Notable for the following components:   Troponin I (High Sensitivity) 19 (*)    All other components within normal limits  TROPONIN I (HIGH SENSITIVITY) - Abnormal; Notable for the following components:   Troponin I (High Sensitivity) 19 (*)    All other components within normal limits  SARS CORONAVIRUS 2 (TAT 6-24 HRS)   BUN  Date Value Ref Range Status  12/05/2018 22 8 - 23 mg/dL Final  10/23/2014 24 (H) 6 - 20 mg/dL Final  03/05/2014 27 (H) 6 - 23 mg/dL Final  02/18/2014 38 (H) 6 - 23 mg/dL Final   Creatinine, Ser  Date Value Ref Range Status  12/05/2018 1.33 (H) 0.61 - 1.24 mg/dL Final  10/23/2014 1.36 (H) 0.61 - 1.24 mg/dL Final  03/05/2014 1.17 0.50 - 1.35 mg/dL Final  02/18/2014 1.29 0.50 - 1.35 mg/dL Final     EKG EKG Interpretation  Date/Time:  Tuesday December 05 2018 13:45:43 EDT Ventricular Rate:  93 PR Interval:  210 QRS Duration: 102 QT Interval:  388 QTC Calculation: 482 R Axis:   19 Text Interpretation:  Sinus rhythm with 1st degree A-V block with occasional Premature ventricular complexes Low voltage QRS Prolonged QT Artifact Abnormal ECG Confirmed by Carmin Muskrat [id number]) on  12/05/2018 10:13:08 PM   Radiology Dg Chest 2 View  Result Date: 12/05/2018 CLINICAL DATA:  Shortness of breath for 1 day EXAM: CHEST - 2 VIEW COMPARISON:  10/23/2014 FINDINGS: Mild bilateral interstitial thickening. No pleural effusion, focal consolidation or pneumothorax. Stable cardiomegaly. No acute osseous abnormality. IMPRESSION: 1. Cardiomegaly with mild pulmonary vascular congestion. Electronically Signed   By: Kathreen Devoid   On: 12/05/2018 14:27    Procedures Procedures (including critical care time)  Medications Ordered in ED Medications  furosemide (LASIX) injection 80 mg (has no administration in time range)  Warfarin - Pharmacist Dosing Inpatient (has no administration in time range)  sodium chloride flush (NS) 0.9 % injection 3 mL (3 mLs Intravenous Given 12/05/18 1827)  furosemide (LASIX) injection 60 mg (60 mg Intravenous Given 12/05/18 1918)     Initial Impression / Assessment and Plan / ED Course  I have reviewed the triage vital signs and the nursing notes.  Pertinent labs & imaging results that were available during my care of the patient were  reviewed by me and considered in my medical decision making (see chart for details).  Clinical Course as of Dec 05 117  Tue Dec 05, 2018  1758 I received the following message from Dr. Virgina Jock, patient's PCP: I sent Mr Aguilera to ED c presumed CHF exacerbation.  He compained of DOE/SOB/PND/Orthopnea and chest pains.  He has a h/o chronic combined systolic and diastolic CHF (30/1601) Echo: EF 20-25%, A-fib w/ RVR (2010 s/p failed cardioversion), DM, HTN and OSA on CPAP and used to see Dr Jeffie Pollock and stopped seeing him on his own.  He has upped his diuretics of late without help.  He also has CKD c Cr 1.3-1.8 ver time.  Recent DM and very high A1C - Insulin titrated.  I see his CXR showed Vascular congestion. The Trop is 1 point high.  Can you add a BNP and consider whether he needs admission and IV diuretics/ a new ECHO, etc.    [SJ]  63 Dr. Virgina Jock states last value was 12.1 on 11/24/18.  Patient denies hematochezia or melena.  Hemoglobin(!): 10.3 [SJ]  2022 Spoke with Dr. Flossie Buffy, hospitalist, agrees to admit the patient.   [SJ]    Clinical Course User Index [SJ] Dontavius Keim C, PA-C       Patient presents with progressive shortness of breath over about the past 2 weeks.  He does have some increased work of breathing, SPO2 on room air down to 89%.  Suspicion for CHF exacerbation. Pulmonary vascular congestion on chest x-ray.  Troponin mildly elevated, but delta troponins flat.  BNP is mildly elevated. My suspicion for ACS as primary cause for the patient's symptoms is low.  Suspicion for PE is low, especially in light of patient's therapeutic level of anticoagulation. Patient admitted for diuresis and continued evaluation and management.  Findings and plan of care discussed with Adrian Prows, MD. Dr. Vanita Panda personally evaluated and examined this patient.   Vitals:   12/05/18 1745 12/05/18 1800 12/05/18 1815 12/05/18 1828  BP: 113/83 112/81 109/67   Pulse: 87 86 77   Resp: (!) 21 19 (!) 22   Temp:      TempSrc:      SpO2: (!) 89% 90% 92%   Weight:    133.8 kg  Height:    _0  (1.727 m)     Final Clinical Impressions(s) / ED Diagnoses   Final diagnoses:  SOB (shortness of breath)    ED Discharge Orders    None       Layla Maw 12/06/18 0119    Carmin Muskrat, MD 12/06/18 [phone]

## 2018-12-06 ENCOUNTER — Inpatient Hospital Stay (HOSPITAL_COMMUNITY): Payer: Medicare HMO

## 2018-12-06 DIAGNOSIS — I5043 Acute on chronic combined systolic (congestive) and diastolic (congestive) heart failure: Secondary | ICD-10-CM

## 2018-12-06 DIAGNOSIS — I5021 Acute systolic (congestive) heart failure: Secondary | ICD-10-CM

## 2018-12-06 LAB — BASIC METABOLIC PANEL
Anion gap: 9 (ref 5–15)
BUN: 21 mg/dL (ref 8–23)
CO2: 28 mmol/L (ref 22–32)
Calcium: 8.1 mg/dL — ABNORMAL LOW (ref 8.9–10.3)
Chloride: 98 mmol/L (ref 98–111)
Creatinine, Ser: 1.34 mg/dL — ABNORMAL HIGH (ref 0.61–1.24)
GFR calc Af Amer: >60 mL/min (ref >60)
GFR calc non Af Amer: 54 mL/min — ABNORMAL LOW (ref >60)
Glucose, Bld: 253 mg/dL — ABNORMAL HIGH (ref 70–99)
Potassium: 3.8 mmol/L (ref 3.5–5.1)
Sodium: 135 mmol/L (ref 135–145)

## 2018-12-06 LAB — CBC
HCT: 35.8 % — ABNORMAL LOW (ref 39.0–52.0)
Hemoglobin: 10.3 g/dL — ABNORMAL LOW (ref 13.0–17.0)
MCH: 25.9 pg — ABNORMAL LOW (ref 26.0–34.0)
MCHC: 28.8 g/dL — ABNORMAL LOW (ref 30.0–36.0)
MCV: 90.2 fL (ref 80.0–100.0)
Platelets: 408 10*3/uL — ABNORMAL HIGH (ref 150–400)
RBC: 3.97 MIL/uL — ABNORMAL LOW (ref 4.22–5.81)
RDW: 17.6 % — ABNORMAL HIGH (ref 11.5–15.5)
WBC: 10.6 10*3/uL — ABNORMAL HIGH (ref 4.0–10.5)
nRBC: 0 % (ref 0.0–0.2)

## 2018-12-06 LAB — GLUCOSE, CAPILLARY
Glucose-Capillary: 122 mg/dL — ABNORMAL HIGH (ref 70–99)
Glucose-Capillary: 138 mg/dL — ABNORMAL HIGH (ref 70–99)
Glucose-Capillary: 148 mg/dL — ABNORMAL HIGH (ref 70–99)

## 2018-12-06 LAB — CBG MONITORING, ED
Glucose-Capillary: 230 mg/dL — ABNORMAL HIGH (ref 70–99)
Glucose-Capillary: 167 mg/dL — ABNORMAL HIGH (ref 70–99)

## 2018-12-06 LAB — ECHOCARDIOGRAM COMPLETE
Height: 68 in
Weight: 4720 oz

## 2018-12-06 LAB — PROTIME-INR
INR: 2.3 — ABNORMAL HIGH (ref 0.8–1.2)
Prothrombin Time: 25 seconds — ABNORMAL HIGH (ref 11.4–15.2)

## 2018-12-06 LAB — SARS CORONAVIRUS 2 (TAT 6-24 HRS): SARS Coronavirus 2: NEGATIVE

## 2018-12-06 LAB — HEMOGLOBIN A1C
Hgb A1c MFr Bld: 13.3 % — ABNORMAL HIGH (ref 4.8–5.6)
Mean Plasma Glucose: 335.01 mg/dL

## 2018-12-06 LAB — VITAMIN B12: Vitamin B-12: 422 pg/mL (ref 180–914)

## 2018-12-06 LAB — IRON AND TIBC
Iron: 18 ug/dL — ABNORMAL LOW (ref 45–182)
Saturation Ratios: 5 % — ABNORMAL LOW (ref 17.9–39.5)
TIBC: 346 ug/dL (ref 250–450)
UIBC: 328 ug/dL

## 2018-12-06 LAB — HIV ANTIBODY (ROUTINE TESTING W REFLEX): HIV Screen 4th Generation wRfx: NONREACTIVE

## 2018-12-06 MED ORDER — INSULIN ASPART 100 UNIT/ML ~~LOC~~ SOLN
0.0000 [IU] | Freq: Three times a day (TID) | SUBCUTANEOUS | Status: DC
  Administered 2018-12-06 (×2): 2 [IU] via SUBCUTANEOUS
  Administered 2018-12-07: 07:00:00 3 [IU] via SUBCUTANEOUS
  Administered 2018-12-07: 12:00:00 2 [IU] via SUBCUTANEOUS
  Administered 2018-12-08: 18:00:00 5 [IU] via SUBCUTANEOUS
  Administered 2018-12-08: 12:00:00 3 [IU] via SUBCUTANEOUS
  Administered 2018-12-08: 07:00:00 2 [IU] via SUBCUTANEOUS
  Administered 2018-12-09: 3 [IU] via SUBCUTANEOUS
  Administered 2018-12-09: 17:00:00 2 [IU] via SUBCUTANEOUS
  Administered 2018-12-09: 3 [IU] via SUBCUTANEOUS
  Administered 2018-12-10: 10 [IU] via SUBCUTANEOUS
  Administered 2018-12-10 – 2018-12-11 (×4): 3 [IU] via SUBCUTANEOUS
  Administered 2018-12-11 – 2018-12-12 (×2): 8 [IU] via SUBCUTANEOUS
  Administered 2018-12-12: 3 [IU] via SUBCUTANEOUS
  Administered 2018-12-12: 18:00:00 8 [IU] via SUBCUTANEOUS
  Administered 2018-12-13 (×2): 11 [IU] via SUBCUTANEOUS
  Administered 2018-12-13: 15 [IU] via SUBCUTANEOUS
  Administered 2018-12-14: 5 [IU] via SUBCUTANEOUS
  Administered 2018-12-14: 12:00:00 8 [IU] via SUBCUTANEOUS

## 2018-12-06 MED ORDER — AMIODARONE HCL 200 MG PO TABS
200.0000 mg | ORAL_TABLET | Freq: Every day | ORAL | Status: DC
  Administered 2018-12-06 – 2018-12-14 (×9): 200 mg via ORAL
  Filled 2018-12-06 (×9): qty 1

## 2018-12-06 MED ORDER — TRAMADOL HCL 50 MG PO TABS
50.0000 mg | ORAL_TABLET | Freq: Once | ORAL | Status: AC
  Administered 2018-12-06: 50 mg via ORAL
  Filled 2018-12-06: qty 1

## 2018-12-06 MED ORDER — WARFARIN SODIUM 5 MG PO TABS
5.0000 mg | ORAL_TABLET | Freq: Once | ORAL | Status: AC
  Administered 2018-12-06: 5 mg via ORAL
  Filled 2018-12-06 (×2): qty 1

## 2018-12-06 MED ORDER — SPIRONOLACTONE 12.5 MG HALF TABLET
12.5000 mg | ORAL_TABLET | Freq: Every day | ORAL | Status: DC
  Administered 2018-12-06 – 2018-12-10 (×5): 12.5 mg via ORAL
  Filled 2018-12-06 (×6): qty 1

## 2018-12-06 MED ORDER — GUAIFENESIN-DM 100-10 MG/5ML PO SYRP
5.0000 mL | ORAL_SOLUTION | ORAL | Status: DC | PRN
  Administered 2018-12-06 – 2018-12-11 (×5): 5 mL via ORAL
  Filled 2018-12-06 (×5): qty 5

## 2018-12-06 MED ORDER — LIVING WELL WITH DIABETES BOOK
Freq: Once | Status: AC
  Administered 2018-12-06: 18:00:00
  Filled 2018-12-06: qty 1

## 2018-12-06 MED ORDER — INSULIN DETEMIR 100 UNIT/ML ~~LOC~~ SOLN
40.0000 [IU] | Freq: Two times a day (BID) | SUBCUTANEOUS | Status: DC
  Administered 2018-12-06 – 2018-12-07 (×4): 40 [IU] via SUBCUTANEOUS
  Filled 2018-12-06 (×8): qty 0.4

## 2018-12-06 MED ORDER — SODIUM CHLORIDE 0.9 % IV SOLN
510.0000 mg | Freq: Once | INTRAVENOUS | Status: AC
  Administered 2018-12-06: 510 mg via INTRAVENOUS
  Filled 2018-12-06 (×2): qty 17

## 2018-12-06 MED ORDER — INSULIN ASPART 100 UNIT/ML ~~LOC~~ SOLN
30.0000 [IU] | Freq: Three times a day (TID) | SUBCUTANEOUS | Status: DC
  Administered 2018-12-06 – 2018-12-07 (×5): 30 [IU] via SUBCUTANEOUS

## 2018-12-06 MED ORDER — PERFLUTREN LIPID MICROSPHERE
1.0000 mL | INTRAVENOUS | Status: AC | PRN
  Administered 2018-12-06: 2 mL via INTRAVENOUS
  Filled 2018-12-06: qty 10

## 2018-12-06 MED ORDER — DIGOXIN 125 MCG PO TABS: 0.1250 mg | ORAL_TABLET | Freq: Every day | ORAL | Status: DC

## 2018-12-06 MED ORDER — SIMVASTATIN 20 MG PO TABS: 20.0000 mg | ORAL_TABLET | Freq: Every day | ORAL | Status: DC

## 2018-12-06 MED ORDER — SPIRONOLACTONE 25 MG PO TABS: 25.0000 mg | ORAL_TABLET | Freq: Every day | ORAL | Status: DC

## 2018-12-06 NOTE — ED Notes (Signed)
Lunch Tray Ordered @ 1038.  

## 2018-12-06 NOTE — ED Notes (Signed)
TELE 

## 2018-12-06 NOTE — TOC Benefit Eligibility Note (Signed)
Transition of Care [facility]) Benefit Eligibility Note    Patient Details  Name: Raymond Pratt MRN: [id number] Date of Birth: 31-Aug-1950   Medication/Dose: Delene Loll 24-26 MG BID  Covered?: Yes  Tier: 3 Drug  Prescription Coverage [facility]  Spoke with Person/Company/Phone Number:: CATELYN  @ [employer] # [phone]  Co-Pay: $115.52  Prior Approval: No  Deductible: Met  Additional Notes: Q/L TWO PILL PER DAY  AND   PATIENT NEAR THE COVERAGE  GAP    Memory Argue Phone Number: 12/06/2018, 4:48 PM

## 2018-12-06 NOTE — ED Notes (Signed)
Breakfast Ordered 

## 2018-12-06 NOTE — Progress Notes (Signed)
Orthopedic Tech Progress Note Patient Details:  Raymond Pratt 1951/01/23 [id number]  Patient ID: [name], male   DOB: 1951/02/18, 68 y.o.   MRN: [id number]   [place] Pink 12/06/2018, 2:48 PMOrtho out of unna boots wraps

## 2018-12-06 NOTE — ED Notes (Signed)
Pt is NSR on monitor 

## 2018-12-06 NOTE — ED Notes (Signed)
Attempted to call report and secretary stated the nurse will call me back
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
## 2018-12-06 NOTE — Progress Notes (Signed)
Pt. Refused cpap. Pt. States he does not wear cpap anymore.

## 2018-12-06 NOTE — ED Notes (Signed)
Report given to Philis Nettle, RN on 3E at bedside

## 2018-12-06 NOTE — ED Notes (Signed)
Patient found without monitoring equipment stated he does not want it on. Pt had removed nasal cannula. This RN explained that oxygen would help pt's breathing. Pt continuing to remove equipment and O2.

## 2018-12-06 NOTE — Progress Notes (Signed)
PROGRESS NOTE  Raymond Pratt [id number] DOB: 29-May-1950 DOA: 12/05/2018 PCP: Shon Baton, MD  HPI/Recap of past 24 hours:  Raymond Pratt is a 68 y.o. male with medical history significant of nonischemic cardiomyopathy with EF of 15-20 on echo on 02/2014, atrial fibrillation on Coumadin, hypertension, type 2 diabetes, OSA on CPAP who presents with concerns of worsening shortness of breath.  Patient reports that he has had gradual worsening of his shortness of breath for the past several weeks.  He was able to lay flat about 3 weeks ago but has had to prop himself up with more pillows due to shortness of breath at night.  Also notes worsening bilateral lower extremity edema.  Has some abdominal bloating as well.  He reports that he has been taking his torsemide daily and was unaware of Lasix which is on his medication list.  He reports that he was also given metolazone but stopped about a month ago since he was only given about "5 pills."  He denies any fever or chest pain.  Denies any abdominal pain, nausea or vomiting.  ED Course: He was afebrile and normotensive on room air.  CBC showed no leukocytosis and had new anemia 10.3 that is decreased from a 13.9 four years ago.  Troponin flat at 19.  BNP of 111.  INR of 2.6.  Chest x-ray showed cardiomegaly with mild pulmonary vascular congestion.  EKG shows sinus rhythm with first-degree AV block with occasional PVCs with prolonged QTc of 482.  12/06/18: Seen and examined at bedside this morning.  Reports persistent nonproductive cough and dyspnea with minimal movement.  No chest pain at this time.  Significant volume overload on exam.  Ongoing diuresing.  Assessment/Plan: Active Problems:   Type 2 diabetes mellitus with hyperlipidemia (HCC)   OSA on CPAP   Anticoagulated on Coumadin   CHF (congestive heart failure) ([place])   Acute on chronic systolic CHF suspect secondary to diet and medication noncompliance. Last 2D echo LVEF 15 to 20%  2D echo ordered and pending. Ongoing diuresing On IV Lasix 80 mg twice daily Strict I's and O's and daily weight Closely monitor blood pressure, electrolytes and renal function while on IV diuretics Start daily BMPs  Acute hypoxic respiratory failure likely secondary to pulmonary edema from acute on chronic systolic CHF On the monitor in the ED room patient saturating in the low 90s on 4 L of oxygen by nasal cannula Unclear if patient uses oxygen supplementation at baseline Continue to maintain O2 saturation greater than 92% Continue diuresing  Paroxysmal A. fib on Coumadin In sinus rhythm and rate controlled Resume Toprol-XL as blood pressure permits Resume Spironolactone and digoxin Therapeutic INR on Coumadin, 2.3 on 12/06/2018 Pharmacy managing Coumadin  Type 2 diabetes with hyperglycemia Obtain hemoglobin A1c, 13.3 on 12/06/2018 Hold off oral anti-glycemic's Continue insulin sliding scale Diabetes coordinator for patient education  Hyperlipidemia Resume statin  Medical noncompliance Needs education reinforcement  CKD 3 Appears to be at his baseline creatinine 1.3 with GFR 54 Avoid nephrotoxins Monitor urine output Repeat BMP in the morning  Iron deficiency anemia Iron studies significant for remarkable iron deficiency next Hemoglobin stable at 10 with MCV 90, hemoglobin 13 in 2016 Iron 18 Trans sat 5 No sign of overt bleeding Transfuse IV Feraheme 510 mg once  OSA Continue CPAP   DVT prophylaxis:WARFARIN Code Status:Full Family Communication: None at bedside.    Disposition Plan: Patient is currently not appropriate for discharge at this time due to persistent volume overload  in the setting of acute on chronic systolic CHF, ongoing diuresing with IV diuretics, acute hypoxic respiratory failure, in the setting of multiple comorbidities and advanced age.  Patient will require at least 2 midnights for further evaluation and treatment of present condition.      Consults called: Cardiology    Objective: Vitals:   12/06/18 0700 12/06/18 0730 12/06/18 0745 12/06/18 0800  BP: 133/88 124/85 (!) 125/91 (!) 133/103  Pulse: 81 71 75 91  Resp: (!) 22 18 20  (!) 22  Temp:      TempSrc:      SpO2: 98% 95% 97% 96%  Weight:      Height:       No intake or output data in the 24 hours ending 12/06/18 0903 Filed Weights   12/05/18 1828  Weight: 133.8 kg    Exam:  . General: 68 y.o. year-old male well developed well nourished in no acute distress.  Alert and oriented x3. . Cardiovascular: Regular rate and rhythm with no rubs or gallops.     Marland Kitchen Respiratory: Diffuse rales bilaterally with no wheezing noted.  Good respiratory effort.   . Abdomen: Severely obese nontender nondistended with normal bowel sounds x4 quadrants. . Musculoskeletal: 2+ lower extremity edema up to abdomen. Marland Kitchen Psychiatry: Mood is appropriate for condition and setting   Data Reviewed: CBC: Recent Labs  Lab 12/05/18 1403 12/06/18 0300  WBC 9.9 10.6*  HGB 10.3* 10.3*  HCT 35.1* 35.8*  MCV 90.0 90.2  PLT 388 [id number]*   Basic Metabolic Panel: Recent Labs  Lab 12/05/18 1403 12/06/18 0300  NA 139 135  K 4.0 3.8  CL 99 98  CO2 28 28  GLUCOSE 206* 253*  BUN 22 21  CREATININE 1.33* 1.34*  CALCIUM 8.4* 8.1*   GFR: Estimated Creatinine Clearance: 71.6 mL/min (A) (by C-G formula based on SCr of 1.34 mg/dL (H)). Liver Function Tests: Recent Labs  Lab 12/05/18 1825  AST 23  ALT 28  ALKPHOS 69  BILITOT 0.7  PROT 6.9  ALBUMIN 3.1*   No results for input(s): LIPASE, AMYLASE in the last 168 hours. No results for input(s): AMMONIA in the last 168 hours. Coagulation Profile: Recent Labs  Lab 12/05/18 1825 12/06/18 0300  INR 2.6* 2.3*   Cardiac Enzymes: No results for input(s): CKTOTAL, CKMB, CKMBINDEX, TROPONINI in the last 168 hours. BNP (last 3 results) No results for input(s): PROBNP in the last 8760 hours. HbA1C: Recent Labs    12/06/18 0215  HGBA1C  13.3*   CBG: Recent Labs  Lab 12/06/18 0318 12/06/18 0755  GLUCAP 230* 167*   Lipid Profile: No results for input(s): CHOL, HDL, LDLCALC, TRIG, CHOLHDL, LDLDIRECT in the last 72 hours. Thyroid Function Tests: No results for input(s): TSH, T4TOTAL, FREET4, T3FREE, THYROIDAB in the last 72 hours. Anemia Panel: Recent Labs    12/05/18 2333  VITAMINB12 422  TIBC 346  IRON 18*   Urine analysis:    Component Value Date/Time   COLORURINE YELLOW 01/04/2014 [place] 01/04/2014 0955   LABSPEC 1.014 01/04/2014 0955   PHURINE 7.5 01/04/2014 0955   GLUCOSEU NEGATIVE 01/04/2014 0955   HGBUR NEGATIVE 01/04/2014 0955   HGBUR trace-lysed 08/04/2008 [place] 01/04/2014 0955   [place] 01/04/2014 0955   PROTEINUR NEGATIVE 01/04/2014 0955   UROBILINOGEN 1.0 01/04/2014 0955   NITRITE NEGATIVE 01/04/2014 0955   LEUKOCYTESUR NEGATIVE 01/04/2014 0955   Sepsis Labs: @LABRCNTIP (procalcitonin:4,lacticidven:4)  ) Recent Results (from the past 240  hour(s))  SARS CORONAVIRUS 2 (TAT 6-24 HRS) Nasopharyngeal Nasopharyngeal Swab     Status: None   Collection Time: 12/05/18  6:25 PM   Specimen: Nasopharyngeal Swab  Result Value Ref Range Status   SARS Coronavirus 2 NEGATIVE NEGATIVE Final    Comment: (NOTE) SARS-CoV-2 target nucleic acids are NOT DETECTED. The SARS-CoV-2 RNA is generally detectable in upper and lower respiratory specimens during the acute phase of infection. Negative results do not preclude SARS-CoV-2 infection, do not rule out co-infections with other pathogens, and should not be used as the sole basis for treatment or other patient management decisions. Negative results must be combined with clinical observations, patient history, and epidemiological information. The expected result is Negative. Fact Sheet for Patients: SugarRoll.be Fact Sheet for Healthcare Providers:  https://www.woods-mathews.com/ This test is not yet approved or cleared by the [place] FDA and  has been authorized for detection and/or diagnosis of SARS-CoV-2 by FDA under an Emergency Use Authorization (EUA). This EUA will remain  in effect (meaning this test can be used) for the duration of the COVID-19 declaration under Section 56 4(b)(1) of the Act, 21 U.S.C. section 360bbb-3(b)(1), unless the authorization is terminated or revoked sooner. Performed at [facility], [place] [address], [place], [place] [address]       Studies: Dg Chest 2 View  Result Date: 12/05/2018 CLINICAL DATA:  Shortness of breath for 1 day EXAM: CHEST - 2 VIEW COMPARISON:  10/23/2014 FINDINGS: Mild bilateral interstitial thickening. No pleural effusion, focal consolidation or pneumothorax. Stable cardiomegaly. No acute osseous abnormality. IMPRESSION: 1. Cardiomegaly with mild pulmonary vascular congestion. Electronically Signed   By: Kathreen Devoid   On: 12/05/2018 14:27    Scheduled Meds: . furosemide  80 mg Intravenous BID  . insulin aspart  0-15 Units Subcutaneous TID WC  . insulin aspart  30 Units Subcutaneous TID WC  . insulin detemir  40 Units Subcutaneous BID  . Warfarin - Pharmacist Dosing Inpatient   Does not apply q1800    Continuous Infusions:   LOS: 1 day     Kayleen Memos, MD [facility] Pager [phone]  If 7PM-7AM, please contact night-coverage [email or web address] Password [email or web address] 12/06/2018, 9:03 AM

## 2018-12-06 NOTE — Progress Notes (Signed)
Ortho tech paged for [employer] per order by MD, ortho tech called and was told that they are out of unna boot.

## 2018-12-06 NOTE — ED Notes (Signed)
Patient placed on recliner for comfort. Patient continuing to take off monitor equipment stating my vital signs do not be to taken this much. This RN explained that we needed to monitor vital signs for pt safety. Pt verbalized understanding and equipment replaced. Pt continuing to express frustrations about not having room upstairs. Pt stated he would like to go home, get comfortable and the hospital call him when his room is ready. This RN explained to pt that he would have to check in and go through entire process again if he chose to leave. Pt returned to recliner on his own and monitoring equipment reapplied.

## 2018-12-06 NOTE — Progress Notes (Signed)
[facility] for Warfarin Indication: atrial fibrillation  Allergies  Allergen Reactions  . Actos [Pioglitazone] Other (See Comments)    Made the patient retain fluid    Patient Measurements: Height: 5\' 8"  (172.7 cm) Weight: 295 lb (133.8 kg) IBW/kg (Calculated) : 68.4  Vital Signs: BP: 141/99 (09/30 1000) Pulse Rate: 86 (09/30 0900)  Labs: Recent Labs    12/05/18 1403 12/05/18 1825 12/06/18 0300  HGB 10.3*  --  10.3*  HCT 35.1*  --  35.8*  PLT 388  --  408*  LABPROT  --  27.1* 25.0*  INR  --  2.6* 2.3*  CREATININE 1.33*  --  1.34*  TROPONINIHS 19* 19*  --     Estimated Creatinine Clearance: 71.6 mL/min (A) (by C-G formula based on SCr of 1.34 mg/dL (H)).   Assessment: 68 y.o. male presenting with SOB. Pt continues on warfarin for history of afib. INR is therapeutic today at 2.3. No bleeding noted.   Goal of Therapy:  INR 2-3 Monitor platelets by anticoagulation protocol: Yes   Plan:  Warfarin 5mg  PO x 1 tonight Daily INR  Salome Arnt, PharmD, BCPS Please see AMION for all pharmacy numbers 12/06/2018 10:19 AM

## 2018-12-06 NOTE — Progress Notes (Signed)
  Echocardiogram 2D Echocardiogram with definity has been performed.  Darlina Sicilian M 12/06/2018, 9:09 AM
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
## 2018-12-06 NOTE — ED Notes (Signed)
Attempted to call report a second time and they said nurse was busy. I'm going to do bedside report

## 2018-12-06 NOTE — ED Notes (Signed)
Pt stated the doctor told him to poor urinal into the sink. This RN explained I would empty his urinals and not to use sink. Pt had urinated on floor. This RN cleaned urine up.   Pt stating he does not like the monitoring equipment and continues to take equipment off. Pt also stated he does not like hospital gowns. Pt had changed into his normal clothes and refuses to change into gown again. This RN explained to patient that he is getting admitted to the hospital but the emergency room was currently in a holding pattern. Pt expressed frustrations about not having room upstairs. Patient comforted and returned to bed.   Monitoring equipment replaced. And urinals emptied and replaced.

## 2018-12-06 NOTE — Progress Notes (Signed)
Patient refused CPAP for tonight. Patient stated he was too congested and unable to tolerate CPAP tonight. RT instructed patient to have RN call RT if he changes his mind. RT will monitor as needed.

## 2018-12-07 ENCOUNTER — Inpatient Hospital Stay: Payer: Self-pay

## 2018-12-07 LAB — BASIC METABOLIC PANEL
Anion gap: 9 (ref 5–15)
BUN: 23 mg/dL (ref 8–23)
CO2: 30 mmol/L (ref 22–32)
Calcium: 9 mg/dL (ref 8.9–10.3)
Chloride: 102 mmol/L (ref 98–111)
Creatinine, Ser: 1.31 mg/dL — ABNORMAL HIGH (ref 0.61–1.24)
GFR calc Af Amer: >60 mL/min (ref >60)
GFR calc non Af Amer: 56 mL/min — ABNORMAL LOW (ref >60)
Glucose, Bld: 86 mg/dL (ref 70–99)
Potassium: 4.2 mmol/L (ref 3.5–5.1)
Sodium: 141 mmol/L (ref 135–145)

## 2018-12-07 LAB — PROTIME-INR
INR: 2 — ABNORMAL HIGH (ref 0.8–1.2)
Prothrombin Time: 22.1 seconds — ABNORMAL HIGH (ref 11.4–15.2)

## 2018-12-07 LAB — GLUCOSE, CAPILLARY
Glucose-Capillary: 160 mg/dL — ABNORMAL HIGH (ref 70–99)
Glucose-Capillary: 128 mg/dL — ABNORMAL HIGH (ref 70–99)
Glucose-Capillary: 103 mg/dL — ABNORMAL HIGH (ref 70–99)
Glucose-Capillary: 67 mg/dL — ABNORMAL LOW (ref 70–99)

## 2018-12-07 LAB — FOLATE RBC
Folate, Hemolysate: >620 ng/mL
Folate, RBC: >1761 ng/mL (ref >498)
Hematocrit: 35.2 % — ABNORMAL LOW (ref 37.5–51.0)

## 2018-12-07 MED ORDER — WARFARIN SODIUM 7.5 MG PO TABS
7.5000 mg | ORAL_TABLET | Freq: Once | ORAL | Status: AC
  Administered 2018-12-07: 7.5 mg via ORAL
  Filled 2018-12-07: qty 1

## 2018-12-07 MED ORDER — SODIUM CHLORIDE 0.9% FLUSH
10.0000 mL | Freq: Two times a day (BID) | INTRAVENOUS | Status: DC
  Administered 2018-12-09: 22:00:00 10 mL
  Administered 2018-12-10: 20 mL
  Administered 2018-12-10 – 2018-12-11 (×2): 10 mL
  Administered 2018-12-13: 30 mL
  Administered 2018-12-14: 10 mL

## 2018-12-07 MED ORDER — CHLORHEXIDINE GLUCONATE CLOTH 2 % EX PADS
6.0000 | MEDICATED_PAD | Freq: Every day | CUTANEOUS | Status: DC
  Administered 2018-12-07 – 2018-12-14 (×6): 6 via TOPICAL

## 2018-12-07 MED ORDER — ATORVASTATIN CALCIUM 40 MG PO TABS
40.0000 mg | ORAL_TABLET | Freq: Every day | ORAL | Status: DC
  Administered 2018-12-07 – 2018-12-14 (×8): 40 mg via ORAL
  Filled 2018-12-07 (×8): qty 1

## 2018-12-07 MED ORDER — SODIUM CHLORIDE 0.9% FLUSH: 10.0000 mL | INTRAVENOUS | Status: DC | PRN

## 2018-12-07 MED ORDER — DM-GUAIFENESIN ER 30-600 MG PO TB12
2.0000 | ORAL_TABLET | Freq: Two times a day (BID) | ORAL | Status: DC
  Administered 2018-12-07 – 2018-12-09 (×4): 2 via ORAL
  Filled 2018-12-07 (×10): qty 2

## 2018-12-07 MED ORDER — TRAMADOL HCL 50 MG PO TABS
50.0000 mg | ORAL_TABLET | Freq: Every day | ORAL | Status: DC
  Administered 2018-12-07 – 2018-12-13 (×7): 50 mg via ORAL
  Filled 2018-12-07 (×7): qty 1

## 2018-12-07 MED ORDER — SODIUM CHLORIDE 3 % IN NEBU
4.0000 mL | INHALATION_SOLUTION | Freq: Every day | RESPIRATORY_TRACT | Status: AC
  Administered 2018-12-07 – 2018-12-09 (×2): 4 mL via RESPIRATORY_TRACT
  Filled 2018-12-07 (×3): qty 4

## 2018-12-07 MED ORDER — FUROSEMIDE 10 MG/ML IJ SOLN
20.0000 mg/h | INTRAVENOUS | Status: DC
  Administered 2018-12-07 – 2018-12-10 (×6): 20 mg/h via INTRAVENOUS
  Filled 2018-12-07: qty 21
  Filled 2018-12-07 (×2): qty 25
  Filled 2018-12-07: qty 21
  Filled 2018-12-07 (×2): qty 25
  Filled 2018-12-07: qty 21
  Filled 2018-12-07: qty 25
  Filled 2018-12-07: qty 21
  Filled 2018-12-07: qty 25

## 2018-12-07 NOTE — Discharge Instructions (Addendum)
Heart Healthy, Consistent Carbohydrate Nutrition Therapy   A heart-healthy and consistent carbohydrate diet is recommended to manage heart disease and diabetes. To follow a heart-healthy and consistent carbohydrate diet,  Eat a balanced diet with whole grains, fruits and vegetables, and lean protein sources.   Choose heart-healthy unsaturated fats. Limit saturated fats, trans fats, and cholesterol intake. Eat more plant-based or vegetarian meals using beans and soy foods for protein.   Eat whole, unprocessed foods to limit the amount of sodium (salt) you eat.   Choose a consistent amount of carbohydrate at each meal and snack. Limit refined carbohydrates especially sugar, sweets and sugar-sweetened beverages.   If you drink alcohol, do so in moderation: one serving per day (women) and two servings per day (men). o One serving is equivalent to 12 ounces beer, 5 ounces wine, or 1.5 ounces distilled spirits      Tips Tips for Choosing Heart-Healthy Fats Choose lean protein and low-fat dairy foods to reduce saturated fat intake.  Saturated fat is usually found in animal-based protein and is associated with certain health risks. Saturated fat is the biggest contributor to raise low-density lipoprotein (LDL) cholesterol levels. Research shows that limiting saturated fat lowers unhealthy cholesterol levels. Eat no more than 7% of your total calories each day from saturated fat. Ask your RDN to help you determine how much saturated fat is right for you.  There are many foods that do not contain large amounts of saturated fats. Swapping these foods to replace foods high in saturated fats will help you limit the saturated fat you eat and improve your cholesterol levels. You can also try eating more plant-based or vegetarian meals. Instead of Try:  Whole milk, cheese, yogurt, and ice cream 1% or skim milk, low-fat cheese, non-fat yogurt, and low-fat ice cream  Fatty, marbled beef and pork Lean  beef, pork, or venison  Poultry with skin Poultry without skin  Butter, stick margarine Reduced-fat, whipped, or liquid spreads  Coconut oil, palm oil Liquid vegetable oils: corn, canola, olive, soybean and safflower oils   Avoid foods that contain trans fats.  Trans fats increase levels of LDL-cholesterol. Hydrogenated fat in processed foods is the main source of trans fats in foods.   Trans fats can be found in stick margarine, shortening, processed sweets, baked goods, some fried foods, and packaged foods made with hydrogenated oils. Avoid foods with partially hydrogenated oil on the ingredient list such as: cookies, pastries, baked goods, biscuits, crackers, microwave popcorn, and frozen dinners. Choose foods with heart healthy fats.  Polyunsaturated and monounsaturated fat are unsaturated fats that may help lower your blood cholesterol level when used in place of saturated fat in your diet.  Ask your RDN about taking a dietary supplement with plant sterols and stanols to help lower your cholesterol level.  Research shows that substituting saturated fats with unsaturated fats is beneficial to cholesterol levels. Try these easy swaps: Instead of Try:  Butter, stick margarine, or solid shortening Reduced-fat, whipped, or liquid spreads  Beef, pork, or poultry with skin Fish and seafood  Chips, crackers, snack foods Raw or unsalted nuts and seeds or nut butters Hummus with vegetables Avocado on toast  Coconut oil, palm oil Liquid vegetable oils: corn, canola, olive, soybean and safflower oils  Limit the amount of cholesterol you eat to less than 200 milligrams per day.  Cholesterol is a substance carried through the bloodstream via lipoproteins, which are known as transporters of fat. Some body functions need cholesterol to work  properly, but too much cholesterol in the bloodstream can damage arteries and build up blood vessel linings (which can lead to heart attack and stroke). You  should eat less than 200 milligrams cholesterol per day.  People respond differently to eating cholesterol. There is no test available right now that can figure out which people will respond more to dietary cholesterol and which will respond less. For individuals with high intake of dietary cholesterol, different types of increase (none, small, moderate, large) in LDL-cholesterol levels are all possible.   Food sources of cholesterol include egg yolks and organ meats such as liver, gizzards. Limit egg yolks to two to four per week and avoid organ meats like liver and gizzards to control cholesterol intake. Tips for Choosing Heart-Healthy Carbohydrates Consume a consistent amount of carbohydrate  It is important to eat foods with carbohydrates in moderation because they impact your blood glucose level. Carbohydrates can be found in many foods such as:  Grains (breads, crackers, rice, pasta, and cereals)   Starchy Vegetables (potatoes, corn, and peas)   Beans and legumes   Milk, soy milk, and yogurt   Fruit and fruit juice   Sweets (cakes, cookies, ice cream, jam and jelly)  Your RDN will help you set a goal for how many carbohydrate servings to eat at your meals and snacks. For many adults, eating 3 to 5 servings of carbohydrate foods at each meal and 1 or 2 carbohydrate servings for each snack works well.   Check your blood glucose level regularly. It can tell you if you need to adjust when you eat carbohydrates.  Choose foods rich in viscous (soluble) fiber  Viscous, or soluble, is found in the walls of plant cells. Viscous fiber is found only in plant-based foods. Eating foods with fiber helps to lower your unhealthy cholesterol and keep your blood glucose in range   Rich sources of viscous fiber include vegetables (asparagus, Brussels sprouts, sweet potatoes, turnips) fruit (apricots, mangoes, oranges), legumes, and whole grains (barley, oats, and oat bran).   As you increase your  fiber intake gradually, also increase the amount of water you drink. This will help prevent constipation.   If you have difficulty achieving this goal, ask your RDN about fiber laxatives. Choose fiber supplements made with viscous fibers such as psyllium seed husks or methylcellulose to help lower unhealthy cholesterol.   Limit refined carbohydrates   There are three types of carbohydrates: starches, sugar, and fiber. Some carbohydrates occur naturally in food, like the starches in rice or corn or the sugars in fruits and milk. Refined carbohydrates--foods with high amounts of simple sugars--can raise triglyceride levels. High triglyceride levels are associated with coronary heart disease.  Some examples of refined carbohydrate foods are table sugar, sweets, and beverages sweetened with added sugar. Tips for Reducing Sodium (Salt) Although sodium is important for your body to function, too much sodium can be harmful for people with high blood pressure. As sodium and fluid buildup in your tissues and bloodstream, your blood pressure increases. High blood pressure may cause damage to other organs and increase your risk for a stroke. Even if you take a pill for blood pressure or a water pill (diuretic) to remove fluid, it is still important to have less salt in your diet. Ask your doctor and RDN what amount of sodium is right for you.  Avoid processed foods. Eat more fresh foods.   Fresh fruits and vegetables are naturally low in sodium, as well as frozen vegetables  and fruits that have no added juices or sauces.   Fresh meats are lower in sodium than processed meats, such as bacon, sausage, and hotdogs. Read the nutrition label or ask your butcher to help you find a fresh meat that is low in sodium.  Eat less salt--at the table and when cooking.   A single teaspoon of table salt has 2,300 mg of sodium.   Leave the salt out of recipes for pasta, casseroles, and soups.   Ask your RDN how to cook  your favorite recipes without sodium  Be a smart shopper.   Look for food packages that say salt-free or sodium-free. These items contain less than 5 milligrams of sodium per serving.   Very low-sodium products contain less than 35 milligrams of sodium per serving.   Low-sodium products contain less than 140 milligrams of sodium per serving.   Beware for Unsalted or No Added Salt products. These items may still be high in sodium. Check the nutrition label.  Add flavors to your food without adding sodium.   Try lemon juice, lime juice, fruit juice or vinegar.   Dry or fresh herbs add flavor. Try basil, bay leaf, dill, rosemary, parsley, sage, dry mustard, nutmeg, thyme, and paprika.   Pepper, red pepper flakes, and cayenne pepper can add spice t your meals without adding sodium. Hot sauce contains sodium, but if you use just a drop or two, it will not add up to much.   Buy a sodium-free seasoning blend or make your own at home. Additional Lifestyle Tips Achieve and maintain a healthy weight.  Talk with your RDN or your doctor about what is a healthy weight for you.  Set goals to reach and maintain that weight.   To lose weight, reduce your calorie intake along with increasing your physical activity. A weight loss of 10 to 15 pounds could reduce LDL-cholesterol by 5 milligrams per deciliter. Participate in physical activity.  Talk with your health care team to find out what types of physical activity are best for you. Set a plan to get about 30 minutes of exercise on most days.  Foods Recommended Food Group Foods Recommended  Grains Whole grain breads and cereals, including whole wheat, barley, rye, buckwheat, corn, teff, quinoa, millet, amaranth, brown or wild rice, sorghum, and oats Pasta, especially whole wheat or other whole grain types  [employer], quinoa or wild rice Whole grain crackers, bread, rolls, pitas Home-made bread with reduced-sodium baking soda    Protein Foods Lean cuts of beef and pork (loin, leg, round, extra lean hamburger)  Skinless Cytogeneticist and other wild game Dried beans and peas Nuts and nut butters Meat alternatives made with soy or textured vegetable protein  Egg whites or egg substitute Cold cuts made with lean meat or soy protein  Dairy Nonfat (skim), low-fat, or 1%-fat milk  Nonfat or low-fat yogurt or cottage cheese Fat-free and low-fat cheese  Vegetables Fresh, frozen, or canned vegetables without added fat or salt   Fruits Fresh, frozen, canned, or dried fruit   Oils Unsaturated oils (corn, olive, peanut, soy, sunflower, canola)  Soft or liquid margarines and vegetable oil spreads  Salad dressings Seeds and nuts  Avocado   Foods Not Recommended Food Group Foods Not Recommended  Grains Breads or crackers topped with salt Cereals (hot or cold) with more than 300 mg sodium per serving Biscuits, cornbread, and other quick breads prepared with baking soda Bread crumbs or stuffing mix from a store  High-fat bakery products, such as doughnuts, biscuits, croissants, danish pastries, pies, cookies Instant cooking foods to which you add hot water and stir--potatoes, noodles, rice, etc. Packaged starchy foods--seasoned noodle or rice dishes, stuffing mix, macaroni and cheese dinner Snacks made with partially hydrogenated oils, including chips, cheese puffs, snack mixes, regular crackers, butter-flavored popcorn  Protein Foods Higher-fat cuts of meats (ribs, t-bone steak, regular hamburger) Bacon, sausage, or hot dogs Cold cuts, such as salami or bologna, deli meats, cured meats, corned beef Organ meats (liver, brains, gizzards, sweetbreads) Poultry with skin Fried or smoked meat, poultry, and fish Whole eggs and egg yolks (more than 2-4 per week) Salted legumes, nuts, seeds, or nut/seed butters Meat alternatives with high levels of sodium (>300 mg per serving) or saturated fat (>5 g per serving)   Dairy Whole milk,?2% fat milk, buttermilk Whole milk yogurt or ice cream Cream Half-&-half Cream cheese Sour cream Cheese  Vegetables Canned or frozen vegetables with salt, fresh vegetables prepared with salt, butter, cheese, or cream sauce Fried vegetables Pickled vegetables such as olives, pickles, or sauerkraut  Fruits Fried fruits Fruits served with butter or cream  Oils Butter, stick margarine, shortening Partially hydrogenated oils or trans fats Tropical oils (coconut, palm, palm kernel oils)  Other Candy, sugar sweetened soft drinks and desserts Salt, sea salt, garlic salt, and seasoning mixes containing salt Bouillon cubes Ketchup, barbecue sauce, Worcestershire sauce, soy sauce, teriyaki sauce Miso Salsa Pickles, olives, relish   Heart Healthy Consistent Carbohydrate Vegetarian (Lacto-Ovo) Sample 1-Day Menu  Breakfast 1 cup oatmeal, cooked (2 carbohydrate servings)   cup blueberries (1 carbohydrate serving)  11 almonds, without salt  1 cup 1% milk (1 carbohydrate serving)  1 cup coffee  Morning Snack 1 cup fat-free plain yogurt (1 carbohydrate serving)  Lunch 1 whole wheat bun (1 carbohydrate servings)  1 black bean burger (1 carbohydrate servings)  1 slice cheddar cheese, low sodium  2 slices tomatoes  2 leaves lettuce  1 teaspoon mustard  1 small pear (1 carbohydrate servings)  1 cup green tea, unsweetened  Afternoon Snack 1/3 cup trail mix with nuts, seeds, and raisins, without salt (1 carbohydrate servinga)  Evening Meal  cup meatless chicken  2/3 cup brown rice, cooked (2 carbohydrate servings)  1 cup broccoli, cooked (2/3 carbohydrate serving)   cup carrots, cooked (1/3 carbohydrate serving)  2 teaspoons olive oil  1 teaspoon balsamic vinegar  1 whole wheat dinner roll (1 carbohydrate serving)  1 teaspoon margarine, soft, tub  1 cup 1% milk (1 carbohydrate serving)  Evening Snack 1 extra small banana (1 carbohydrate serving)  1 tablespoon peanut  butter   Heart Healthy Consistent Carbohydrate Vegan Sample 1-Day Menu  Breakfast 1 cup oatmeal, cooked (2 carbohydrate servings)   cup blueberries (1 carbohydrate serving)  11 almonds, without salt  1 cup soymilk fortified with calcium, vitamin B12, and vitamin D  1 cup coffee  Morning Snack 6 ounces soy yogurt (1 carbohydrate servings)  Lunch 1 whole wheat bun(1 carbohydrate servings)  1 black bean burger (1 carbohydrate serving)  2 slices tomatoes  2 leaves lettuce  1 teaspoon mustard  1 small pear (1 carbohydrate servings)  1 cup green tea, unsweetened  Afternoon Snack 1/3 cup trail mix with nuts, seeds, and raisins, without salt (1 carbohydrate servings)  Evening Meal  cup meatless chicken  2/3 cup brown rice, cooked (2 carbohydrate servings)  1 cup broccoli, cooked (2/3 carbohydrate serving)   cup carrots, cooked (1/3 carbohydrate serving)  2  teaspoons olive oil  1 teaspoon balsamic vinegar  1 whole wheat dinner roll (1 carbohydrate serving)  1 teaspoon margarine, soft, tub  1 cup soymilk fortified with calcium, vitamin B12, and vitamin D  Evening Snack 1 extra small banana (1 carbohydrate serving)  1 tablespoon peanut butter    Heart Healthy Consistent Carbohydrate Sample 1-Day Menu  Breakfast 1 cup cooked oatmeal (2 carbohydrate servings)  3/4 cup blueberries (1 carbohydrate serving)  1 ounce almonds  1 cup skim milk (1 carbohydrate serving)  1 cup coffee  Morning Snack 1 cup sugar-free nonfat yogurt (1 carbohydrate serving)  Lunch 2 slices whole-wheat bread (2 carbohydrate servings)  2 ounces lean [place] breast  1 ounce low-fat Swiss cheese  1 teaspoon mustard  1 slice tomato  1 lettuce leaf  1 small pear (1 carbohydrate serving)  1 cup skim milk (1 carbohydrate serving)  Afternoon Snack 1 ounce trail mix with unsalted nuts, seeds, and raisins (1 carbohydrate serving)  Evening Meal 3 ounces salmon  2/3 cup cooked brown rice (2 carbohydrate servings)  1  teaspoon soft margarine  1 cup cooked broccoli with 1/2 cup cooked carrots (1 carbohydrate serving  Carrots, cooked, boiled, drained, without salt  1 cup lettuce  1 teaspoon olive oil with vinegar for dressing  1 small whole grain roll (1 carbohydrate serving)  1 teaspoon soft margarine  1 cup unsweetened tea  Evening Snack 1 extra-small banana (1 carbohydrate serving)  Copyright 2020  [employer]. All rights reserved.    Call number above and see if you qualify for additional assistance with medications just as we discussed.  Also, remember to follow up with primary care PCP MD regarding diabetes. If your blood sugars exceed 200 mg/dL reach out to MD or [facility] for further insulin adjustments.   Insulin Aspart; Insulin Aspart Protamine injection What is this medicine? INSULIN ASPART; INSULIN ASPART PROTAMINE (IN su lin AS part; IN su lin AS part PRO ta meen) is a human-made form of insulin. This drug lowers the amount of sugar in your blood. This medicine is a mixture of a rapid-acting insulin and a longer-acting insulin. It starts working 10 to 20 minutes after injection and continues to work for as long as 12 to 24 hours. This medicine may be used for other purposes; ask your health care provider or pharmacist if you have questions. COMMON BRAND NAME(S): NovoLog Mix 70/30 What should I tell my health care provider before I take this medicine? They need to know if you have any of these conditions:  episodes of low blood sugar  eye disease, vision problems  kidney disease  liver disease  an unusual or allergic reaction to insulin, metacresol, other medicines, foods, dyes, or preservatives  pregnant or trying to get pregnant  breast-feeding How should I use this medicine? This medicine is for injection under the skin. Use exactly as directed. It is important to follow the directions given to you by your health care professional or doctor. You should  inject this medicine within 15 minutes of starting your meal. You will be taught how to use this medicine and how to adjust doses for activities and illness. Do not use more insulin than prescribed. Do not use more or less often than prescribed. Always check the appearance of your insulin before using it. This medicine should be white and cloudy. Do not use if it is not uniformly cloudy after mixing. To mix this medicine, roll the vial gently  10 times in your hands. Make sure to perform the mixing procedures before each injection. Do not mix this medicine with any other insulin or diluent. If you use a pen, be sure to take off the outer needle cover before using the dose. It is important that you put your used needles and syringes in a special sharps container. Do not put them in a trash can. If you do not have a sharps container, call your pharmacist or healthcare provider to get one. Talk to your pediatrician regarding the use of this medicine in children. Special care may be needed. Overdosage: If you think you have taken too much of this medicine contact a poison control center or emergency room at once. NOTE: This medicine is only for you. Do not share this medicine with others. What if I miss a dose? It is important not to miss a dose. Your health care professional or doctor should discuss a plan for missed doses with you. If you do miss a dose, follow their plan. Do not take double doses. What may interact with this medicine?  other medicines for diabetes Many medications may cause changes in blood sugar, these include:  alcohol containing beverages  antiviral medicines for HIV or AIDS  aspirin and aspirin-like drugs  certain medicines for blood pressure, heart disease, irregular heart beat  chromium  diuretics  male hormones, such as estrogens or progestins, birth control pills  fenofibrate  gemfibrozil  isoniazid  lanreotide  male hormones or anabolic steroids  MAOIs  like Carbex, Eldepryl, Marplan, Nardil, and Parnate  medicines for weight loss  medicines for allergies, asthma, cold, or cough  medicines for depression, anxiety, or psychotic disturbances  niacin  nicotine  NSAIDs, medicines for pain and inflammation, like ibuprofen or naproxen  octreotide  pasireotide  pentamidine  phenytoin  probenecid  quinolone antibiotics such as ciprofloxacin, levofloxacin, ofloxacin  some herbal dietary supplements  steroid medicines such as prednisone or cortisone  sulfamethoxazole; trimethoprim  thyroid hormones Some medications can hide the warning symptoms of low blood sugar (hypoglycemia). You may need to monitor your blood sugar more closely if you are taking one of these medications. These include:  beta-blockers, often used for high blood pressure or heart problems (examples include atenolol, metoprolol, propranolol)  clonidine  guanethidine  reserpine This list may not describe all possible interactions. Give your health care provider a list of all the medicines, herbs, non-prescription drugs, or dietary supplements you use. Also tell them if you smoke, drink alcohol, or use illegal drugs. Some items may interact with your medicine. What should I watch for while using this medicine? Visit your health care professional or doctor for regular checks on your progress. A test called the HbA1C (A1C) will be monitored. This is a simple blood test. It measures your blood sugar control over the last 2 to 3 months. You will receive this test every 3 to 6 months. Learn how to check your blood sugar. Learn the symptoms of low and high blood sugar and how to manage them. Always carry a quick-source of sugar with you in case you have symptoms of low blood sugar. Examples include hard sugar candy or glucose tablets. Make sure others know that you can choke if you eat or drink when you develop serious symptoms of low blood sugar, such as seizures or  unconsciousness. They must get medical help at once. Tell your doctor or health care professional if you have high blood sugar. You might need to change  the dose of your medicine. If you are sick or exercising more than usual, you might need to change the dose of your medicine. Do not skip meals. Ask your doctor or health care professional if you should avoid alcohol. Many nonprescription cough and cold products contain sugar or alcohol. These can affect blood sugar. Make sure that you have the right kind of syringe for the type of insulin you use. Try not to change the brand and type of insulin or syringe unless your health care professional or doctor tells you to. Switching insulin brand or type can cause dangerously high or low blood sugar. Always keep an extra supply of insulin, syringes, and needles on hand. Use a syringe one time only. Throw away syringe and needle in a closed container to prevent accidental needle sticks. Insulin pens and cartridges should never be shared. Even if the needle is changed, sharing may result in passing of viruses like hepatitis or HIV. Each time you get a new box of pen needles, check to see if they are the same type as the ones you were trained to use. If not, ask your health care professional to show you how to use this new type properly. Wear a medical ID bracelet or chain, and carry a card that describes your disease and details of your medicine and dosage times. What side effects may I notice from receiving this medicine? Side effects that you should report to your doctor or health care professional as soon as possible:  allergic reactions like skin rash, itching or hives, swelling of the face, lips, or tongue  breathing problems  signs and symptoms of high blood sugar such as dizziness, dry mouth, dry skin, fruity breath, nausea, stomach pain, increased hunger or thirst, increased urination  signs and symptoms of low blood sugar such as feeling anxious,  confusion, dizziness, increased hunger, unusually weak or tired, sweating, shakiness, cold, irritable, headache, blurred vision, fast heartbeat, loss of consciousness Side effects that usually do not require medical attention (report to your doctor or health care professional if they continue or are bothersome):  increase or decrease in fatty tissue under the skin due to overuse of a particular injection site  itching, burning, swelling, or rash at site where injected This list may not describe all possible side effects. Call your doctor for medical advice about side effects. You may report side effects to FDA at 1-800-FDA-1088. Where should I keep my medicine? Keep out of the reach of children. Unopened Vials: Novolog Mix Vials: Store in a refrigerator between 2 and 8 degrees C (36 and 46 degrees F) or at room temperature below 30 degrees C (86 degrees F). Do not freeze or use if the insulin has been frozen. Protect from light and excessive heat. If stored at room temperature, the vial must be discarded after 28 days. Throw away any unopened and unused medicine that has been stored in the refrigerator after the expiration date. Unopened Pens: Novolog Mix FlexPens: Store in a refrigerator between 2 and 8 degrees C (36 and 46 degrees F) or at room temperature below 30 degrees C (86 degrees F). Do not freeze or use if the insulin has been frozen. Protect from light and excessive heat. If stored at room temperature, the pen must be discarded after 14 days. Throw away any unopened and unused medicine that has been stored in the refrigerator after the expiration date. Vials that you are using: Novolog Mix Vials: Store in the refrigerator or at room  temperature below 30 degrees C (86 degrees F). Do not freeze. Keep away from heat and light. Throw the opened vial away after 28 days. Pens that you are using: Novolog Mix FlexPens: Store at room temperature below 30 degrees C (86 degrees F). Do not refrigerate  or freeze. Keep away from heat and light. Throw the pen away after 14 days, even if it still has insulin left in it. NOTE: This sheet is a summary. It may not cover all possible information. If you have questions about this medicine, talk to your doctor, pharmacist, or health care provider.  2020 Elsevier/Gold Standard (2018-05-29 16:47:52)

## 2018-12-07 NOTE — Consult Note (Signed)
   [facility] CM Inpatient Consult   12/07/2018  Krystian Poliseno 05/02/1950 [id number]   [facility] status: alerted for pending hospital admission in [facility] Medicare  Chart review reveals from MD progress notes for HPI recap which includes but not limited to:  Narayan Wakeland a 67 y.o.malewith medical history significant ofnonischemic cardiomyopathy with EF of 15-20 on echo on 02/2014, atrial fibrillation on Coumadin, hypertension, type 2 diabetes, OSA on CPAP who presents with concerns of worsening shortness of breath. Patient reports that he has had gradual worsening of his shortness of breath for the past several weeks. He was able to lay flat about 3 weeks ago but has had to prop himself up with more pillows due to shortness of breath at night. Also notes worsening bilateral lower extremity edema. Has some abdominal bloating as well. He reports that he has been taking his torsemide daily and was unaware of Lasix which is on his medication list. He reports that he was also given metolazone but stopped about a month ago since he was only given about "5 pills." Admitted with acute on chronic systolic CHF.  Plan:  Follow patient's progress and disposition needs with inpatient [facility] team.  For questions, please contact:  Natividad Brood, RN BSN [facility] Liaison  [phone] business mobile phone Toll free office [phone]  Fax number: [phone] [email or web address]

## 2018-12-07 NOTE — Progress Notes (Signed)
[place] for Warfarin Indication: atrial fibrillation  Allergies  Allergen Reactions  . Actos [Pioglitazone] Other (See Comments)    Made the patient retain fluid    Patient Measurements: Height: 5\' 7"  (170.2 cm) Weight: (!) 322 lb 6.4 oz (146.2 kg) IBW/kg (Calculated) : 66.1  Vital Signs: Temp: 98.4 F (36.9 C) (10/01 0826) Temp Source: Oral (10/01 0826) BP: 117/76 (10/01 0826) Pulse Rate: 90 (10/01 0910)  Labs: Recent Labs    12/05/18 1403 12/05/18 1825 12/06/18 0300 12/07/18 0812  HGB 10.3*  --  10.3*  --   HCT 35.1*  --  35.8*  --   PLT 388  --  408*  --   LABPROT  --  27.1* 25.0* 22.1*  INR  --  2.6* 2.3* 2.0*  CREATININE 1.33*  --  1.34*  --   TROPONINIHS 19* 19*  --   --     Estimated Creatinine Clearance: 74.2 mL/min (A) (by C-G formula based on SCr of 1.34 mg/dL (H)).   Assessment: 68 y.o. male presenting with SOB. Pt continues on warfarin for history of afib. INR is therapeutic today at 2. No bleeding noted.   Goal of Therapy:  INR 2-3 Monitor platelets by anticoagulation protocol: Yes   Plan:  Warfarin 7.5 mg PO x 1 tonight Daily INR  [place] Crane, Roylene Reason, [facility] Clinical Pharmacist Phone [phone]  12/07/2018 9:25 AM

## 2018-12-07 NOTE — Progress Notes (Addendum)
Advanced Heart Failure Rounding Note  PCP-Cardiologist: [name], MD   Subjective:    Still SOB w/ exertion. Remains massively volume overloaded, up 30-40 + lbs.     Objective:   Weight Range: (!) 146.2 kg Body mass index is 50.49 kg/m.   Vital Signs:   Temp:  [97.6 F (36.4 C)-98.4 F (36.9 C)] 98.4 F (36.9 C) (10/01 0826) Pulse Rate:  [59-99] 93 (10/01 0826) Resp:  [16-26] 20 (10/01 0826) BP: (117-152)/(70-107) 117/76 (10/01 0826) SpO2:  [76 %-97 %] 91 % (10/01 0826) Weight:  [138.9 kg-146.2 kg] 146.2 kg (10/01 0603) Last BM Date: 12/06/18  Weight change: Filed Weights   12/05/18 1828 12/06/18 1552 12/07/18 0603  Weight: 133.8 kg (!) 138.9 kg (!) 146.2 kg    Intake/Output:   Intake/Output Summary (Last 24 hours) at 12/07/2018 0849 Last data filed at 12/07/2018 0800 Gross per 24 hour  Intake 1300 ml  Output 1050 ml  Net 250 ml      Physical Exam    General:  Morbidly obese, middle aged WM, poor hygiene. No resp difficulty. Remains on supplemental O2 HEENT: Normal Neck: obese neck, unable to assess JVP . Carotids 2+ bilat; no bruits. No lymphadenopathy or thyromegaly appreciated. Cor: PMI nondisplaced. Regular rate & rhythm. No rubs, gallops or murmurs. Lungs: Clear Abdomen: obese, distended abdomen. No bruits or masses. Good bowel sounds. Extremities: bilateral LEE up to level of thighs w/ bilateral chronic venous stasis dermatitis  Neuro: Alert & orientedx3, cranial nerves grossly intact. moves all 4 extremities w/o difficulty. Affect pleasant   Telemetry   Sinus tach 107 w/ occasional PVCs. No adverse arrhthymias   EKG    No new EKG to review today   Labs    CBC Recent Labs    12/05/18 1403 12/06/18 0300  WBC 9.9 10.6*  HGB 10.3* 10.3*  HCT 35.1* 35.8*  MCV 90.0 90.2  PLT 388 [id number]*   Basic Metabolic Panel Recent Labs    12/05/18 1403 12/06/18 0300  NA 139 135  K 4.0 3.8  CL 99 98  CO2 28 28  GLUCOSE 206* 253*  BUN  22 21  CREATININE 1.33* 1.34*  CALCIUM 8.4* 8.1*   Liver Function Tests Recent Labs    12/05/18 1825  AST 23  ALT 28  ALKPHOS 69  BILITOT 0.7  PROT 6.9  ALBUMIN 3.1*   No results for input(s): LIPASE, AMYLASE in the last 72 hours. Cardiac Enzymes No results for input(s): CKTOTAL, CKMB, CKMBINDEX, TROPONINI in the last 72 hours.  BNP: BNP (last 3 results) Recent Labs    12/05/18 1825  BNP 111.3*    ProBNP (last 3 results) No results for input(s): PROBNP in the last 8760 hours.   D-Dimer No results for input(s): DDIMER in the last 72 hours. Hemoglobin A1C Recent Labs    12/06/18 0215  HGBA1C 13.3*   Fasting Lipid Panel No results for input(s): CHOL, HDL, LDLCALC, TRIG, CHOLHDL, LDLDIRECT in the last 72 hours. Thyroid Function Tests No results for input(s): TSH, T4TOTAL, T3FREE, THYROIDAB in the last 72 hours.  Invalid input(s): FREET3  Other results: 2D Echo 12/06/18 Definity contrast agent was given IV to delineate the left ventricular endocardial borders. 2. Left ventricular ejection fraction, by visual estimation, is 25 to 30%. The left ventricle has severely decreased function. Normal left ventricular size. There is no left ventricular hypertrophy. Technically difficult study with poor acoustic windows. There appears to be diffuse hypokinesis. Mildly D-shaped interventricular septum  suggestive of RV pressure/volume overload. 3. Left ventricular diastolic Doppler parameters are consistent with impaired relaxation pattern of LV diastolic filling. 4. Mild mitral annular calcification. 5. The mitral valve is normal in structure. Trace mitral valve regurgitation. No evidence of mitral stenosis. 6. The tricuspid valve is normal in structure. Tricuspid valve regurgitation is trivial. 7. The aortic valve is tricuspid Aortic valve regurgitation was not visualized by color flow Doppler. Mild aortic valve stenosis. Mean gradient 13 mmHg. 8. Global right ventricle  has moderately reduced systolic function.The right ventricular size is moderately enlarged. No increase in right ventricular wall thickness. 9. The inferior vena cava is dilated in size with <50% respiratory variability, suggesting right atrial pressure 15 mmHg. 10. The tricuspid regurgitant velocity is 2.97 m/s, and with an assumed right atrial pressure of 15 mmHg, the estimated right ventricular systolic pressure is moderately elevated at 50.3 mmHg. 11. Left atrial size was mildly dilated. 12. The interatrial septum was not well visualized.   Imaging     No results found.   Medications:     Scheduled Medications: . amiodarone  200 mg Oral Daily  . dextromethorphan-guaiFENesin  2 tablet Oral BID  . furosemide  80 mg Intravenous BID  . insulin aspart  0-15 Units Subcutaneous TID WC  . insulin aspart  30 Units Subcutaneous TID WC  . insulin detemir  40 Units Subcutaneous BID  . sodium chloride HYPERTONIC  4 mL Nebulization Daily  . spironolactone  12.5 mg Oral Daily  . traMADol  50 mg Oral QHS  . Warfarin - Pharmacist Dosing Inpatient   Does not apply q1800     Infusions:   PRN Medications:  guaiFENesin-dextromethorphan  Patient Profile   Raymond Pratt is a 68 y/o morbidly obese WM, with positive PMH of chronic combined systolic and diastolic CHF/ NICM ([id number]) Echo: EF 20-25%, A-fib w/ RVR (2010 s/p failed cardioversion), now on amiodarone, chronic anticoagulation on coumadin w/ INRs followed by PCP, IDDM, HTN and OSA noncompliant w/ CPAP who was lost to f/u in the [facility] and not seen since 2015, now presenting w/ acute on chronic systolic HF, for which AHF has been consulted, at the request of Dr. [place] Crane, Internal Medicine.    Assessment/Plan   1. Acute on Chronic Systolic and Diastolic CHF: diagnosed in 2013. LHC 03/06/12 showed normal coronaries. Previous Echo in 2015 w/ EF at 15-20% w/ mod MR and moderately elevated PA pressure 54 mmHg. Now presenting w/ 4 week history  of progressive bilateral LEE and exertional and resting dyspnea. volume overloaded on exam w/ 2+ bilateral LEE. BNP only 111 however suspect falsely low given morbid obesity.  Chest x-ray demonstrated cardiomegaly with mild pulmonary vascular congestion. Repeat 2D echo 9/30 with persistently low LVEF but slightly improved from prior, now at 25-30% w/ diffuse hypokinesis and moderately elevated RVSP at 50.3 mmHg. - remains massively volume overloaded and needs additional diuresis, weight 30-40 lb above dry wt - Will get PICC line and transition to IV Lasix drip - Check CVP qshift and daily Co-ox - Unna boots ordered - Strict I/Os and daily weights - Daily BMPs for monitoring of renal function and K - Low sodium diet  2. PAF: currently NSR - Continue amiodarone 200 mg daily -Continue coumadin per pharmacy -INR therapeutic   3. OSA: noncompliant w/ CPAP at home - Ordered QHS  4. IDDM: poorly controlled. Hgb A1c 13.3 - getting insulin - management per IM   Length of Stay: 2  Raymond Jester, PA-C  12/07/2018, 8:49 AM  Advanced Heart Failure Team Pager [phone] (M-F; 7a - 4p)  Please contact [facility] for night-coverage after hours (4p -7a ) and weekends on [email or web address]  Patient seen and examined with the above-signed Advanced Practice Provider and/or Housestaff. I personally reviewed laboratory data, imaging studies and relevant notes. I independently examined the patient and formulated the important aspects of the plan. I have edited the note to reflect any of my changes or salient points. I have personally discussed the plan with the patient and/or family.  He is massively volume overloaded in the setting of severe biventricular HF. Suspect weight up > 40 pounds. Will need to move to SDU for PICC line and lasix gtt at 20. Check CVP and co-ox as he may need milrinone again.   Exam General:  Morbidly obese male sitting up in chair. NAD HEENT: normal Neck: supple. JVP hard to  seeCarotids 2+ bilat; no bruits. No lymphadenopathy or thryomegaly appreciated. Cor: PMI nonpalpable Regular rate & rhythm. Distant Lungs: clear decreased BS no wheeze Abdomen: markedly obese soft, nontender, + distended.  No bruits or masses. Good bowel sounds. Extremities: no cyanosis, clubbing, rash, 3-4+ edema into thighs Neuro: alert & orientedx3, cranial nerves grossly intact. moves all 4 extremities w/o difficulty. Affect pleasant  Plan as above. Move to SDU. Place PICC. Lasix gtt.   [name], MD  9:27 AM

## 2018-12-07 NOTE — Progress Notes (Signed)
PROGRESS NOTE  Raymond Pratt [id number] DOB: 08-Feb-1951 DOA: 12/05/2018 PCP: Shon Baton, MD  HPI/Recap of past 24 hours:  Raymond Pratt is a 68 y.o. male with medical history significant of nonischemic cardiomyopathy with EF of 15-20 on echo on 02/2014, atrial fibrillation on Coumadin, hypertension, type 2 diabetes, OSA on CPAP who presents with concerns of worsening shortness of breath.  Patient reports that he has had gradual worsening of his shortness of breath for the past several weeks.  He was able to lay flat about 3 weeks ago but has had to prop himself up with more pillows due to shortness of breath at night.  Also notes worsening bilateral lower extremity edema.  Has some abdominal bloating as well.  He reports that he has been taking his torsemide daily and was unaware of Lasix which is on his medication list.  He reports that he was also given metolazone but stopped about a month ago since he was only given about "5 pills."  He denies any fever or chest pain.  Denies any abdominal pain, nausea or vomiting.  ED Course: He was afebrile and normotensive on room air.  CBC showed no leukocytosis and had new anemia 10.3 that is decreased from a 13.9 four years ago.  Troponin flat at 19.  BNP of 111.  INR of 2.6.  Chest x-ray showed cardiomegaly with mild pulmonary vascular congestion.  EKG shows sinus rhythm with first-degree AV block with occasional PVCs with prolonged QTc of 482.   Significant volume overload, seen by advanced heart failure team, following.    12/07/18: Patient was seen and examined at his bedside this morning.  Reports persistent cough.  Started on pulmonary toilet.  Persistent dyspnea with minimal exertion.  Ongoing diuresing.  Assessment/Plan: Active Problems:   Type 2 diabetes mellitus with hyperlipidemia (HCC)   OSA on CPAP   Anticoagulated on Coumadin   CHF (congestive heart failure) ([place])   Acute on chronic systolic CHF suspect secondary to diet and  medication noncompliance. Last 2D echo LVEF 15 to 20% 2D echo ordered and pending. Ongoing diuresing Continue IV Lasix 80 mg twice daily Continue strict I's and O's and daily weight U/O 1.0L in last 24 h; Net I&O -560 cc Continue to closely monitor blood pressure, electrolytes and renal function while on IV diuretics Continue daily BMPs  Acute hypoxic respiratory failure likely secondary to pulmonary edema from acute on chronic systolic CHF On the monitor in the ED room patient saturating in the low 90s on 4 L of oxygen by nasal cannula Unclear if patient uses oxygen supplementation at baseline Continue to maintain O2 saturation greater than 92% Continue diuresing  Intractable cough likely multifactorial secondary to acute on chronic systolic CHF versus others Started pulmonary toilet with Mucinex 1200 mg twice daily and hypersaline nebs  Chronic bilateral lower extremity pain States he takes tramadol at night for his pain Resume home med  Paroxysmal A. fib on Coumadin In sinus rhythm and rate controlled Toprol held in the setting of volume overload with hypoxia Continue medications as recommended by the advanced heart failure team Therapeutic INR on Coumadin, 2.0 on 12/07/2018 Pharmacy managing Coumadin  Type 2 diabetes with hyperglycemia Obtain hemoglobin A1c, 13.3 on 12/06/2018 Hold off oral anti-glycemic's Continue insulin sliding scale Diabetes coordinator for patient education  Hyperlipidemia Continue statin  Medical noncompliance Needs education reinforcement  CKD 3 Appears to be at his baseline creatinine 1.3 with GFR 54 Avoid nephrotoxins Monitor urine output Continue daily BMP  Iron deficiency anemia Iron studies significant for remarkable iron deficiency next Hemoglobin stable at 10 with MCV 90, hemoglobin 13 in 2016 Iron 18 Trans sat 5 No sign of overt bleeding Transfused IV Feraheme 510 mg once  OSA Continue CPAP  Severe morbid obesity BMI 50  Recommend weight loss outpatient with reg physical activity and healthy dieting   DVT prophylaxis:WARFARIN Code Status:Full Family Communication: None at bedside.    Disposition Plan: Patient is currently not appropriate for discharge at this time due to persistent volume overload in the setting of acute on chronic systolic CHF, ongoing diuresing with IV diuretics, acute hypoxic respiratory failure, in the setting of multiple comorbidities and advanced age.  Patient will require at least 2 midnights for further evaluation and treatment of present condition.     Consults called: Cardiology    Objective: Vitals:   12/07/18 0826 12/07/18 0910 12/07/18 1131 12/07/18 1230  BP: 117/76  123/73 138/71  Pulse: 93 90 82 85  Resp: 20 20 20    Temp: 98.4 F (36.9 C)  98.2 F (36.8 C)   TempSrc: Oral  Oral   SpO2: 91% 93% 99%   Weight:      Height:        Intake/Output Summary (Last 24 hours) at 12/07/2018 1238 Last data filed at 12/07/2018 1224 Gross per 24 hour  Intake 1440 ml  Output 2100 ml  Net -660 ml   Filed Weights   12/05/18 1828 12/06/18 1552 12/07/18 0603  Weight: 133.8 kg (!) 138.9 kg (!) 146.2 kg    Exam:  . General: 68 y.o. year-old male morbid obesity no acute distress. . Oriented x3.   . Cardiovascular: Regular rate and rhythm no rubs or gallops.  Marland Kitchen Respiratory: Mild rales bilaterally diffusely.  Good inspiratory effort. . Abdomen: Very obese nontender nondistended normal bowel sounds present.   . Musculoskeletal: 2+ lower extremity edema all the way up to abdomen. Marland Kitchen Psychiatry: Mood is appropriate for condition and setting.   Data Reviewed: CBC: Recent Labs  Lab 12/05/18 1403 12/06/18 0300  WBC 9.9 10.6*  HGB 10.3* 10.3*  HCT 35.1* 35.8*  MCV 90.0 90.2  PLT 388 [id number]*   Basic Metabolic Panel: Recent Labs  Lab 12/05/18 1403 12/06/18 0300  NA 139 135  K 4.0 3.8  CL 99 98  CO2 28 28  GLUCOSE 206* 253*  BUN 22 21  CREATININE 1.33* 1.34*   CALCIUM 8.4* 8.1*   GFR: Estimated Creatinine Clearance: 74.2 mL/min (A) (by C-G formula based on SCr of 1.34 mg/dL (H)). Liver Function Tests: Recent Labs  Lab 12/05/18 1825  AST 23  ALT 28  ALKPHOS 69  BILITOT 0.7  PROT 6.9  ALBUMIN 3.1*   No results for input(s): LIPASE, AMYLASE in the last 168 hours. No results for input(s): AMMONIA in the last 168 hours. Coagulation Profile: Recent Labs  Lab 12/05/18 1825 12/06/18 0300 12/07/18 0812  INR 2.6* 2.3* 2.0*   Cardiac Enzymes: No results for input(s): CKTOTAL, CKMB, CKMBINDEX, TROPONINI in the last 168 hours. BNP (last 3 results) No results for input(s): PROBNP in the last 8760 hours. HbA1C: Recent Labs    12/06/18 0215  HGBA1C 13.3*   CBG: Recent Labs  Lab 12/06/18 1314 12/06/18 1609 12/06/18 2127 12/07/18 0625 12/07/18 1145  GLUCAP 122* 138* 148* 160* 128*   Lipid Profile: No results for input(s): CHOL, HDL, LDLCALC, TRIG, CHOLHDL, LDLDIRECT in the last 72 hours. Thyroid Function Tests: No results for input(s): TSH, T4TOTAL, FREET4,  T3FREE, THYROIDAB in the last 72 hours. Anemia Panel: Recent Labs    12/05/18 2333  VITAMINB12 422  TIBC 346  IRON 18*   Urine analysis:    Component Value Date/Time   COLORURINE YELLOW 01/04/2014 [place] 01/04/2014 0955   LABSPEC 1.014 01/04/2014 0955   PHURINE 7.5 01/04/2014 0955   GLUCOSEU NEGATIVE 01/04/2014 0955   HGBUR NEGATIVE 01/04/2014 0955   HGBUR trace-lysed 08/04/2008 [place] 01/04/2014 0955   Opp 01/04/2014 0955   PROTEINUR NEGATIVE 01/04/2014 0955   UROBILINOGEN 1.0 01/04/2014 0955   NITRITE NEGATIVE 01/04/2014 0955   LEUKOCYTESUR NEGATIVE 01/04/2014 0955   Sepsis Labs: @LABRCNTIP (procalcitonin:4,lacticidven:4)  ) Recent Results (from the past 240 hour(s))  SARS CORONAVIRUS 2 (TAT 6-24 HRS) Nasopharyngeal Nasopharyngeal Swab     Status: None   Collection Time: 12/05/18  6:25 PM   Specimen:  Nasopharyngeal Swab  Result Value Ref Range Status   SARS Coronavirus 2 NEGATIVE NEGATIVE Final    Comment: (NOTE) SARS-CoV-2 target nucleic acids are NOT DETECTED. The SARS-CoV-2 RNA is generally detectable in upper and lower respiratory specimens during the acute phase of infection. Negative results do not preclude SARS-CoV-2 infection, do not rule out co-infections with other pathogens, and should not be used as the sole basis for treatment or other patient management decisions. Negative results must be combined with clinical observations, patient history, and epidemiological information. The expected result is Negative. Fact Sheet for Patients: SugarRoll.be Fact Sheet for Healthcare Providers: https://www.woods-mathews.com/ This test is not yet approved or cleared by the [place] FDA and  has been authorized for detection and/or diagnosis of SARS-CoV-2 by FDA under an Emergency Use Authorization (EUA). This EUA will remain  in effect (meaning this test can be used) for the duration of the COVID-19 declaration under Section 56 4(b)(1) of the Act, 21 U.S.C. section 360bbb-3(b)(1), unless the authorization is terminated or revoked sooner. Performed at [facility], [place] [address], [place], [place] [address]       Studies: [place] Ekg Site Rite  Result Date: 12/07/2018 If [facility] image not attached, placement could not be confirmed due to current cardiac rhythm.   Scheduled Meds: . amiodarone  200 mg Oral Daily  . dextromethorphan-guaiFENesin  2 tablet Oral BID  . insulin aspart  0-15 Units Subcutaneous TID WC  . insulin aspart  30 Units Subcutaneous TID WC  . insulin detemir  40 Units Subcutaneous BID  . sodium chloride HYPERTONIC  4 mL Nebulization Daily  . spironolactone  12.5 mg Oral Daily  . traMADol  50 mg Oral QHS  . warfarin  7.5 mg Oral ONCE-1800  . Warfarin - Pharmacist Dosing Inpatient   Does not apply q1800     Continuous Infusions: . furosemide (LASIX) infusion 20 mg/hr (12/07/18 1137)     LOS: 2 days     Kayleen Memos, MD [facility] Pager [phone]  If 7PM-7AM, please contact night-coverage [email or web address] Password [facility] 12/07/2018, 12:38 PM

## 2018-12-07 NOTE — Progress Notes (Signed)
Peripherally Inserted Central Catheter/Midline Placement  The IV Nurse has discussed with the patient and/or persons authorized to consent for the patient, the purpose of this procedure and the potential benefits and risks involved with this procedure.  The benefits include less needle sticks, lab draws from the catheter, and the patient may be discharged home with the catheter. Risks include, but not limited to, infection, bleeding, blood clot (thrombus formation), and puncture of an artery; nerve damage and irregular heartbeat and possibility to perform a PICC exchange if needed/ordered by physician.  Alternatives to this procedure were also discussed.  Bard Power PICC patient education guide, fact sheet on infection prevention and patient information card has been provided to patient /or left at bedside.    PICC/Midline Placement Documentation  PICC Triple Lumen [id number] PICC Left Basilic 48 cm 0 cm (Active)  Indication for Insertion or Continuance of Line Chronic illness with exacerbations (CF, Sickle Cell, etc.) 12/07/18 1444  Exposed Catheter (cm) 0 cm 12/07/18 1444  Site Assessment Clean;Dry;Intact 12/07/18 1444  Lumen #1 Status Flushed;Saline locked;Blood return noted 12/07/18 1444  Lumen #2 Status Flushed;Blood return noted;Saline locked 12/07/18 1444  Lumen #3 Status Flushed;Blood return noted;Saline locked 12/07/18 1444  Dressing Type Transparent;Securing device 12/07/18 1444  Dressing Status Clean;Dry;Intact;Antimicrobial disc in place 12/07/18 1444  Dressing Change Due 12/14/18 12/07/18 1444       Frances Maywood 12/07/2018, 2:47 PM

## 2018-12-07 NOTE — Progress Notes (Signed)
Nutrition Education Note  RD consulted for nutrition education regarding CHF and diabetes.  Lab Results  Component Value Date   HGBA1C 13.3 (H) 12/06/2018   PTA DM medications are 20 units insulin NPO BID, 100 units sitagliptin daily,.   Labs reviewed: CBGS: 138-160 (inpatient orders for glycemic control are 0-15 units insulin aspart TID with meals, 30 units insulin aspart TID with meals, and 40 units insulin detemir BID).   On first attempt to visit pt, pt using bedside commode at time of visit. Called pt on room phone later in the day, however, no answer.   Per MD notes, plan to place PICC line and administer IV lasix secondary to severe fluid overload. Pt has been lost to follow-up to [facility] since 2015 due to inability to afford specialist co-pays. Additionally, he has experienced further medical decline since COVID-19 pandemic secondary to inability to participate in tele-visits to PCP.   RD provided "Heart Healthy, Consistent Carbohydrate Nutrition Therapy" handout from the Academy of Nutrition and Dietetics. RD attached handout to AVS/ discharge summary. RD also ordered referral to [place]'s Nutrition and Diabetes [facility] for further education and reinforcement.   Body mass index is 50.49 kg/m. Pt meets criteria for extreme obesity, class III based on current BMI.  Current diet order is heart healthy/ carb modified, patient is consuming approximately 100% of meals at this time. Labs and medications reviewed. No further nutrition interventions warranted at this time. RD contact information provided. If additional nutrition issues arise, please re-consult RD.   Haile Toppins A. Jimmye Norman, RD, LDN, [place] Registered Dietitian II Certified Diabetes Care and Education Specialist Pager: [phone] After hours Pager: [phone]

## 2018-12-08 ENCOUNTER — Other Ambulatory Visit: Payer: Self-pay

## 2018-12-08 ENCOUNTER — Telehealth (HOSPITAL_COMMUNITY): Payer: Self-pay | Admitting: Pharmacist

## 2018-12-08 LAB — BASIC METABOLIC PANEL
Anion gap: 11 (ref 5–15)
BUN: 21 mg/dL (ref 8–23)
CO2: 30 mmol/L (ref 22–32)
Calcium: 8.4 mg/dL — ABNORMAL LOW (ref 8.9–10.3)
Chloride: 95 mmol/L — ABNORMAL LOW (ref 98–111)
Creatinine, Ser: 1.35 mg/dL — ABNORMAL HIGH (ref 0.61–1.24)
GFR calc Af Amer: >60 mL/min (ref >60)
GFR calc non Af Amer: 54 mL/min — ABNORMAL LOW (ref >60)
Glucose, Bld: 203 mg/dL — ABNORMAL HIGH (ref 70–99)
Potassium: 3.7 mmol/L (ref 3.5–5.1)
Sodium: 136 mmol/L (ref 135–145)

## 2018-12-08 LAB — PROTIME-INR
INR: 1.9 — ABNORMAL HIGH (ref 0.8–1.2)
Prothrombin Time: 21.9 seconds — ABNORMAL HIGH (ref 11.4–15.2)

## 2018-12-08 LAB — GLUCOSE, CAPILLARY
Glucose-Capillary: 89 mg/dL (ref 70–99)
Glucose-Capillary: 150 mg/dL — ABNORMAL HIGH (ref 70–99)
Glucose-Capillary: 167 mg/dL — ABNORMAL HIGH (ref 70–99)
Glucose-Capillary: 234 mg/dL — ABNORMAL HIGH (ref 70–99)
Glucose-Capillary: 257 mg/dL — ABNORMAL HIGH (ref 70–99)

## 2018-12-08 LAB — COOXEMETRY PANEL
Carboxyhemoglobin: 1.9 % — ABNORMAL HIGH (ref 0.5–1.5)
Methemoglobin: 1.3 % (ref 0.0–1.5)
O2 Saturation: 71.5 %
Total hemoglobin: 11.1 g/dL — ABNORMAL LOW (ref 12.0–16.0)

## 2018-12-08 MED ORDER — POTASSIUM CHLORIDE CRYS ER 20 MEQ PO TBCR
40.0000 meq | EXTENDED_RELEASE_TABLET | Freq: Two times a day (BID) | ORAL | Status: AC
  Administered 2018-12-08 (×2): 40 meq via ORAL
  Filled 2018-12-08 (×2): qty 2

## 2018-12-08 MED ORDER — INSULIN ASPART 100 UNIT/ML ~~LOC~~ SOLN: 20.0000 [IU] | Freq: Three times a day (TID) | SUBCUTANEOUS | Status: DC

## 2018-12-08 MED ORDER — ACETAMINOPHEN 325 MG PO TABS
650.0000 mg | ORAL_TABLET | Freq: Four times a day (QID) | ORAL | Status: DC | PRN
  Administered 2018-12-08 – 2018-12-11 (×5): 650 mg via ORAL
  Filled 2018-12-08 (×5): qty 2

## 2018-12-08 MED ORDER — WARFARIN SODIUM 7.5 MG PO TABS
7.5000 mg | ORAL_TABLET | Freq: Once | ORAL | Status: AC
  Administered 2018-12-08: 18:00:00 7.5 mg via ORAL
  Filled 2018-12-08: qty 1

## 2018-12-08 MED ORDER — SACUBITRIL-VALSARTAN 24-26 MG PO TABS
1.0000 | ORAL_TABLET | Freq: Two times a day (BID) | ORAL | Status: DC
  Administered 2018-12-08 – 2018-12-14 (×13): 1 via ORAL
  Filled 2018-12-08 (×15): qty 1

## 2018-12-08 MED ORDER — INSULIN DETEMIR 100 UNIT/ML ~~LOC~~ SOLN
30.0000 [IU] | Freq: Two times a day (BID) | SUBCUTANEOUS | Status: DC
  Administered 2018-12-08 – 2018-12-13 (×10): 30 [IU] via SUBCUTANEOUS
  Filled 2018-12-08 (×11): qty 0.3

## 2018-12-08 NOTE — Care Management Important Message (Signed)
Important Message  Patient Details  Name: Raymond Pratt MRN: [id number] Date of Birth: 02/09/1951   Medicare Important Message Given:  Yes     Orbie Pyo 12/08/2018, 3:13 PM

## 2018-12-08 NOTE — Progress Notes (Signed)
PROGRESS NOTE  Raymond Pratt [id number] DOB: 1950/09/18 DOA: 12/05/2018 PCP: Shon Baton, MD  HPI/Recap of past 24 hours:  Raymond Pratt is a 68 y.o. male with medical history significant of nonischemic cardiomyopathy with EF of 15-20 on echo on 02/2014, atrial fibrillation on Coumadin, hypertension, type 2 diabetes, OSA on CPAP who presents with concerns of worsening shortness of breath.  Patient reports that he has had gradual worsening of his shortness of breath for the past several weeks.  He was able to lay flat about 3 weeks ago but has had to prop himself up with more pillows due to shortness of breath at night.  Also notes worsening bilateral lower extremity edema.  Has some abdominal bloating as well.  He reports that he has been taking his torsemide daily and was unaware of Lasix which is on his medication list.  He reports that he was also given metolazone but stopped about a month ago since he was only given about "5 pills."  He denies any fever or chest pain.  Denies any abdominal pain, nausea or vomiting.  ED Course: He was afebrile and normotensive on room air.  CBC showed no leukocytosis and had new anemia 10.3 that is decreased from a 13.9 four years ago.  Troponin flat at 19.  BNP of 111.  INR of 2.6.  Chest x-ray showed cardiomegaly with mild pulmonary vascular congestion.  EKG shows sinus rhythm with first-degree AV block with occasional PVCs with prolonged QTc of 482.   Significant volume overload, seen by advanced heart failure team, following.    12/08/18: Patient was seen and examined at bedside this morning.  No acute events overnight.  Report dyspnea with minimal exertion.  Not on oxygen supplementation at baseline.  Reports noncompliance with CPAP at home.  Ongoing diuresing, on insulin drip.  Assessment/Plan: Active Problems:   Type 2 diabetes mellitus with hyperlipidemia (HCC)   OSA on CPAP   Anticoagulated on Coumadin   CHF (congestive heart failure) ([place])    Acute on chronic systolic CHF suspect secondary to diet and medication noncompliance. Last 2D echo LVEF 15 to 20% 2D echo ordered and pending. Ongoing diuresing Continue IV Lasix 80 mg twice daily Continue strict I's and O's and daily weight U/O 1.0L in last 24 h; Net I&O -4.2L Electrolytes and renal function stable Continue daily BMPs  Improving acute hypoxic respiratory failure likely secondary to pulmonary edema from acute on chronic systolic CHF Improving with diuresis Continue to maintain O2 saturation greater than 92%  Intractable cough likely multifactorial secondary to acute on chronic systolic CHF versus others Started pulmonary toilet with Mucinex 1200 mg twice daily and hypersaline nebs  OSA, noncompliant with CPAP Encourage CPAP use  Chronic bilateral lower extremity pain States he takes tramadol at night for his pain Resume home med  Paroxysmal A. fib on Coumadin In sinus rhythm and rate controlled Toprol held in the setting of volume overload with hypoxia Continue medications as recommended by the advanced heart failure team Subtherapeutic INR 1.9 on 12/08/2018 Pharmacy managing Coumadin  Subtherapeutic INR Coumadin INR 1.9 on 12/08/2018 Pharmacy managing Daily INR  Type 2 diabetes with hyperglycemia transient hypoglycemia likely iatrogenic Obtain hemoglobin A1c, 13.3 on 12/06/2018 Blood sugar 67 overnight, treated Decreased dose of Levemir from 40 units daily to 30 units daily Decreased dose of NovoLog from 30 units 3 times daily to 20 units 3 times daily Continue to closely monitor CBGs and avoid hypoglycemia  Hyperlipidemia Continue statin  Medical noncompliance  Needs education reinforcement  CKD 3 Baseline creatinine 1.3 with GFR 54 Creatinine 1.35 with GFR of 54 on 12/08/2018 Avoid nephrotoxins Monitor urine output Continue daily BMP  Iron deficiency anemia Iron studies significant for remarkable iron deficiency next Hemoglobin stable at 10  with MCV 90, hemoglobin 13 in 2016 Iron 18 Trans sat 5 No sign of overt bleeding Transfused IV Feraheme 510 mg once  Severe morbid obesity BMI 50 Recommend weight loss outpatient with reg physical activity and healthy dieting   DVT prophylaxis:WARFARIN Code Status:Full Family Communication: None at bedside.    Disposition Plan: Patient is currently not appropriate for discharge at this time due to persistent volume overload in the setting of acute on chronic systolic CHF, ongoing diuresing with IV diuretics, acute hypoxic respiratory failure, in the setting of multiple comorbidities and advanced age.  Patient will require at least 2 midnights for further evaluation and treatment of present condition.     Consults called: Cardiology    Objective: Vitals:   12/07/18 2100 12/08/18 0441 12/08/18 0452 12/08/18 1143  BP: 125/75 123/65  106/61  Pulse: 83 84 91 81  Resp:  17 20 16   Temp:  97.6 F (36.4 C)  97.8 F (36.6 C)  TempSrc:  Oral  Oral  SpO2: 95% 97% 96% 96%  Weight:   (!) 149.2 kg   Height:        Intake/Output Summary (Last 24 hours) at 12/08/2018 1240 Last data filed at 12/08/2018 1149 Gross per 24 hour  Intake 766.53 ml  Output 4500 ml  Net -3733.47 ml   Filed Weights   12/06/18 1552 12/07/18 0603 12/08/18 0452  Weight: (!) 138.9 kg (!) 146.2 kg (!) 149.2 kg    Exam:  . General: 68 y.o. year-old male morbid obesity no acute distress.  Alert oriented x3.   . Cardiovascular: Regular rate and rhythm no rubs or gallops no JVD or thyromegaly noted. Marland Kitchen Respiratory: Mild rales at bases no wheezing noted.  Poor inspiratory effort.   . Abdomen: Obese nontender normal bowel sounds present.   . Musculoskeletal: 2+ pitting edema in lower extremities bilaterally.   Marland Kitchen Psychiatry: Mood is appropriate for condition and setting.   Data Reviewed: CBC: Recent Labs  Lab 12/05/18 1403 12/05/18 2333 12/06/18 0300  WBC 9.9  --  10.6*  HGB 10.3*  --  10.3*  HCT 35.1*  35.2* 35.8*  MCV 90.0  --  90.2  PLT 388  --  123XX123*   Basic Metabolic Panel: Recent Labs  Lab 12/05/18 1403 12/06/18 0300 12/07/18 1626 12/08/18 0540  NA 139 135 141 136  K 4.0 3.8 4.2 3.7  CL 99 98 102 95*  CO2 28 28 30 30   GLUCOSE 206* 253* 86 203*  BUN 22 21 23 21   CREATININE 1.33* 1.34* 1.31* 1.35*  CALCIUM 8.4* 8.1* 9.0 8.4*   GFR: Estimated Creatinine Clearance: 74.6 mL/min (A) (by C-G formula based on SCr of 1.35 mg/dL (H)). Liver Function Tests: Recent Labs  Lab 12/05/18 1825  AST 23  ALT 28  ALKPHOS 69  BILITOT 0.7  PROT 6.9  ALBUMIN 3.1*   No results for input(s): LIPASE, AMYLASE in the last 168 hours. No results for input(s): AMMONIA in the last 168 hours. Coagulation Profile: Recent Labs  Lab 12/05/18 1825 12/06/18 0300 12/07/18 0812 12/08/18 0540  INR 2.6* 2.3* 2.0* 1.9*   Cardiac Enzymes: No results for input(s): CKTOTAL, CKMB, CKMBINDEX, TROPONINI in the last 168 hours. BNP (last 3 results) No  results for input(s): PROBNP in the last 8760 hours. HbA1C: Recent Labs    12/06/18 0215  HGBA1C 13.3*   CBG: Recent Labs  Lab 12/07/18 1145 12/07/18 1629 12/07/18 2212 12/08/18 0438 12/08/18 0638  GLUCAP 128* 103* 67* 89 150*   Lipid Profile: No results for input(s): CHOL, HDL, LDLCALC, TRIG, CHOLHDL, LDLDIRECT in the last 72 hours. Thyroid Function Tests: No results for input(s): TSH, T4TOTAL, FREET4, T3FREE, THYROIDAB in the last 72 hours. Anemia Panel: Recent Labs    12/05/18 2333  VITAMINB12 422  TIBC 346  IRON 18*   Urine analysis:    Component Value Date/Time   COLORURINE YELLOW 01/04/2014 [place] 01/04/2014 0955   LABSPEC 1.014 01/04/2014 0955   PHURINE 7.5 01/04/2014 0955   GLUCOSEU NEGATIVE 01/04/2014 0955   HGBUR NEGATIVE 01/04/2014 0955   HGBUR trace-lysed 08/04/2008 [place] 01/04/2014 0955   KETONESUR NEGATIVE 01/04/2014 0955   PROTEINUR NEGATIVE 01/04/2014 0955    UROBILINOGEN 1.0 01/04/2014 0955   NITRITE NEGATIVE 01/04/2014 0955   LEUKOCYTESUR NEGATIVE 01/04/2014 0955   Sepsis Labs: @LABRCNTIP (procalcitonin:4,lacticidven:4)  ) Recent Results (from the past 240 hour(s))  SARS CORONAVIRUS 2 (TAT 6-24 HRS) Nasopharyngeal Nasopharyngeal Swab     Status: None   Collection Time: 12/05/18  6:25 PM   Specimen: Nasopharyngeal Swab  Result Value Ref Range Status   SARS Coronavirus 2 NEGATIVE NEGATIVE Final    Comment: (NOTE) SARS-CoV-2 target nucleic acids are NOT DETECTED. The SARS-CoV-2 RNA is generally detectable in upper and lower respiratory specimens during the acute phase of infection. Negative results do not preclude SARS-CoV-2 infection, do not rule out co-infections with other pathogens, and should not be used as the sole basis for treatment or other patient management decisions. Negative results must be combined with clinical observations, patient history, and epidemiological information. The expected result is Negative. Fact Sheet for Patients: SugarRoll.be Fact Sheet for Healthcare Providers: https://www.woods-mathews.com/ This test is not yet approved or cleared by the [place] FDA and  has been authorized for detection and/or diagnosis of SARS-CoV-2 by FDA under an Emergency Use Authorization (EUA). This EUA will remain  in effect (meaning this test can be used) for the duration of the COVID-19 declaration under Section 56 4(b)(1) of the Act, 21 U.S.C. section 360bbb-3(b)(1), unless the authorization is terminated or revoked sooner. Performed at [facility], [place] [address] [place], [place] [address]       Studies: No results found.  Scheduled Meds: . amiodarone  200 mg Oral Daily  . atorvastatin  40 mg Oral Daily  . Chlorhexidine Gluconate Cloth  6 each Topical Daily  . dextromethorphan-guaiFENesin  2 tablet Oral BID  . insulin aspart  0-15 Units Subcutaneous TID WC   . insulin aspart  20 Units Subcutaneous TID WC  . insulin detemir  30 Units Subcutaneous BID  . potassium chloride  40 mEq Oral BID  . sacubitril-valsartan  1 tablet Oral BID  . sodium chloride flush  10-40 mL Intracatheter Q12H  . sodium chloride HYPERTONIC  4 mL Nebulization Daily  . spironolactone  12.5 mg Oral Daily  . traMADol  50 mg Oral QHS  . warfarin  7.5 mg Oral ONCE-1800  . Warfarin - Pharmacist Dosing Inpatient   Does not apply q1800    Continuous Infusions: . furosemide (LASIX) infusion 20 mg/hr (12/08/18 1154)     LOS: 3 days     Kayleen Memos, MD [facility] Pager [phone]  If 7PM-7AM, please contact night-coverage [email or web address] Password [email or web address] 12/08/2018, 12:40 PM

## 2018-12-08 NOTE — Progress Notes (Signed)
Orthopedic Tech Progress Note Patient Details:  Raymond Pratt [birth year] [id number]   Ortho Devices Type of Ortho Device: Haematologist Ortho Device/Splint Location: bilateral Ortho Device/Splint Interventions: Adjustment, Application, Ordered   Post Interventions Patient Tolerated: Well Instructions Provided: Care of device, Adjustment of device   Janit Pagan 12/08/2018, 12:15 PM

## 2018-12-08 NOTE — Telephone Encounter (Signed)
Patient Advocate Encounter   Received notification from [facility] that prior authorization for Raymond Pratt is required.   PA submitted to [facility] Reference Number: [id number] Phone #: [phone] Status is pending   Representative indicated copay was $45.00.  Will continue to follow.  Audry Riles, PharmD, BCPS, CPP Heart Failure Clinic Pharmacist [phone]

## 2018-12-08 NOTE — Progress Notes (Addendum)
Advanced Heart Failure Rounding Note  PCP-Cardiologist: [name], MD   Subjective:    Feels a bit better today bur remains on [place] 3.5L/min. O2 sats 96%. Remains massively volume overloaded  Excellent response to Lasix gtt, -4.3L out in past 24 hrs Documented daily weights variable, doubt accurate, 295>>306>>322>328 lb  Co-ox good at 71% CVP 5. Doubt accurate. Likely third spacing   Objective:   Weight Range: (!) 149.2 kg Body mass index is 51.52 kg/m.   Vital Signs:   Temp:  [97.6 F (36.4 C)-98.4 F (36.9 C)] 97.6 F (36.4 C) (10/02 0441) Pulse Rate:  [82-93] 91 (10/02 0452) Resp:  [17-20] 20 (10/02 0452) BP: (117-138)/(65-76) 123/65 (10/02 0441) SpO2:  [91 %-99 %] 96 % (10/02 0452) FiO2 (%):  [28 %] 28 % (10/01 0910) Weight:  [149.2 kg] 149.2 kg (10/02 0452) Last BM Date: 12/07/18  Weight change: Filed Weights   12/06/18 1552 12/07/18 0603 12/08/18 0452  Weight: (!) 138.9 kg (!) 146.2 kg (!) 149.2 kg    Intake/Output:   Intake/Output Summary (Last 24 hours) at 12/08/2018 0811 Last data filed at 12/08/2018 0630 Gross per 24 hour  Intake 526.53 ml  Output 4350 ml  Net -3823.47 ml      Physical Exam    General:  Morbidly obese male, Well appearing. No resp difficulty. On [place] HEENT: Normal anicteric Neck: thick neck, unable to assess JVD . Carotids 2+ bilat; no bruits. No lymphadenopathy or thyromegaly appreciated. Cor: PMI nondisplaced. Regular rate & rhythm. No rubs, gallops or murmurs. Lungs: faint bibasilar crackles no wheeze Abdomen: obese, tense/distended, non tender. No hepatosplenomegaly. No bruits or masses. Good bowel sounds. Extremities: No cyanosis, clubbing, 2-3+ LEE w/ chronic venous stasis dermatitis  Neuro: alert & oriented x 3, cranial nerves grossly intact. moves all 4 extremities w/o difficulty. Affect pleasant   Telemetry   NSR in the mid 80s occasional PVCs. One 3 beat run of NSVT  EKG    No new EKG to review today    Labs    CBC Recent Labs    12/05/18 1403 12/05/18 2333 12/06/18 0300  WBC 9.9  --  10.6*  HGB 10.3*  --  10.3*  HCT 35.1* 35.2* 35.8*  MCV 90.0  --  90.2  PLT 388  --  123XX123*   Basic Metabolic Panel Recent Labs    12/06/18 0300 12/07/18 1626  NA 135 141  K 3.8 4.2  CL 98 102  CO2 28 30  GLUCOSE 253* 86  BUN 21 23  CREATININE 1.34* 1.31*  CALCIUM 8.1* 9.0   Liver Function Tests Recent Labs    12/05/18 1825  AST 23  ALT 28  ALKPHOS 69  BILITOT 0.7  PROT 6.9  ALBUMIN 3.1*   No results for input(s): LIPASE, AMYLASE in the last 72 hours. Cardiac Enzymes No results for input(s): CKTOTAL, CKMB, CKMBINDEX, TROPONINI in the last 72 hours.  BNP: BNP (last 3 results) Recent Labs    12/05/18 1825  BNP 111.3*    ProBNP (last 3 results) No results for input(s): PROBNP in the last 8760 hours.   D-Dimer No results for input(s): DDIMER in the last 72 hours. Hemoglobin A1C Recent Labs    12/06/18 0215  HGBA1C 13.3*   Fasting Lipid Panel No results for input(s): CHOL, HDL, LDLCALC, TRIG, CHOLHDL, LDLDIRECT in the last 72 hours. Thyroid Function Tests No results for input(s): TSH, T4TOTAL, T3FREE, THYROIDAB in the last 72 hours.  Invalid input(s): FREET3  Other results: 2D Echo 12/06/18 Definity contrast agent was given IV to delineate the left ventricular endocardial borders. 2. Left ventricular ejection fraction, by visual estimation, is 25 to 30%. The left ventricle has severely decreased function. Normal left ventricular size. There is no left ventricular hypertrophy. Technically difficult study with poor acoustic windows. There appears to be diffuse hypokinesis. Mildly D-shaped interventricular septum suggestive of RV pressure/volume overload. 3. Left ventricular diastolic Doppler parameters are consistent with impaired relaxation pattern of LV diastolic filling. 4. Mild mitral annular calcification. 5. The mitral valve is normal in structure. Trace  mitral valve regurgitation. No evidence of mitral stenosis. 6. The tricuspid valve is normal in structure. Tricuspid valve regurgitation is trivial. 7. The aortic valve is tricuspid Aortic valve regurgitation was not visualized by color flow Doppler. Mild aortic valve stenosis. Mean gradient 13 mmHg. 8. Global right ventricle has moderately reduced systolic function.The right ventricular size is moderately enlarged. No increase in right ventricular wall thickness. 9. The inferior vena cava is dilated in size with <50% respiratory variability, suggesting right atrial pressure 15 mmHg. 10. The tricuspid regurgitant velocity is 2.97 m/s, and with an assumed right atrial pressure of 15 mmHg, the estimated right ventricular systolic pressure is moderately elevated at 50.3 mmHg. 11. Left atrial size was mildly dilated. 12. The interatrial septum was not well visualized.   Imaging    [place] Ekg Site Rite  Result Date: 12/07/2018 If Site Rite image not attached, placement could not be confirmed due to current cardiac rhythm.    Medications:     Scheduled Medications: . amiodarone  200 mg Oral Daily  . atorvastatin  40 mg Oral Daily  . Chlorhexidine Gluconate Cloth  6 each Topical Daily  . dextromethorphan-guaiFENesin  2 tablet Oral BID  . insulin aspart  0-15 Units Subcutaneous TID WC  . insulin aspart  20 Units Subcutaneous TID WC  . insulin detemir  30 Units Subcutaneous BID  . sodium chloride flush  10-40 mL Intracatheter Q12H  . sodium chloride HYPERTONIC  4 mL Nebulization Daily  . spironolactone  12.5 mg Oral Daily  . traMADol  50 mg Oral QHS  . Warfarin - Pharmacist Dosing Inpatient   Does not apply q1800    Infusions: . furosemide (LASIX) infusion 20 mg/hr (12/07/18 1137)    PRN Medications: guaiFENesin-dextromethorphan, sodium chloride flush  Patient Profile   Raymond Pratt is a 68 y/o morbidly obese WM, with positive PMH of chronic combined systolic and diastolic CHF/  NICM ([id number]) Echo: EF 20-25%, A-fib w/ RVR (2010 s/p failed cardioversion), now on amiodarone, chronic anticoagulation on coumadin w/ INRs followed by PCP, IDDM, HTN and OSA noncompliant w/ CPAP who was lost to f/u in the [facility] and not seen since 2015, now presenting w/ acute on chronic systolic HF, for which AHF has been consulted, at the request of Dr. Nevada Crane, Internal Medicine.    Assessment/Plan   1. Acute on Chronic Systolic and Diastolic CHF: diagnosed in 2013. LHC 03/06/12 showed normal coronaries. Previous Echo in 2015 w/ EF at 15-20% w/ mod MR and moderately elevated PA pressure 54 mmHg. Presented 12/06/18 w/ 4 week history of progressive bilateral LEE and exertional and resting dyspnea. Volume overloaded on exam w/ 2+ bilateral LEE. BNP only 111 however suspect falsely low given morbid obesity.  Chest x-ray demonstrated cardiomegaly with mild pulmonary vascular congestion. Repeat 2D echo 9/30 with persistently low LVEF but slightly improved from prior, now at 25-30% w/ diffuse hypokinesis and moderately elevated RVSP  at 50.3 mmHg. - remains massively volume overloaded but responding well to Lasix gtt - Good UOP. 4.3L out in past 24 hr. Net I/Os -3.5L - notes gradual symptomatic improvement - Co-ox good at 71% - CVP recorded at 5 but doubt accurate. Likely 3rd spacing. Place [place] boots for LEE - Continue Lasix gtt - SCr stable at 1.3. K 3.7. Continue to follow closely to monitor SCr and K w/ diuresis - Continue Strict I/Os and daily weights - Low sodium diet - Continue spironolactone 12.5 -Home lisinopril was held on admit. If BMP shows stable SCr and K, plan to start Entresto 24-26 t -Plan to resume  blocker (Toprol XL) once diuresed -Given concomitant T2DM, consider later addition of an SGLT2i (can add as outpatient)  2. PAF: currently NSR. HR mid 80s, occasional PVCs -Continue amiodarone 200 mg daily -On coumadin. INR slightly subtherapeutic today at 1.9 -Continue coumadin dosing per  pharmacy  3. OSA: noncompliant w/ CPAP at home - refusing to use while inpatient   4. IDDM: poorly controlled. Hgb A1c 13.3 - getting insulin - management per IM  -Consider addition of an SGLT2i given concomitant systolic HF w/ EF < [id number]  Length of Stay: 3  Brittainy Simmons, PA-C  12/08/2018, 8:11 AM  Advanced Heart Failure Team Pager [phone] (M-F; 7a - 4p)  Please contact [facility] for night-coverage after hours (4p -7a ) and weekends on [email or web address]   Patient seen and examined with the above-signed Advanced Practice Provider and/or Housestaff. I personally reviewed laboratory data, imaging studies and relevant notes. I independently examined the patient and formulated the important aspects of the plan. I have edited the note to reflect any of my changes or salient points. I have personally discussed the plan with the patient and/or family.  Remains on lasix gtt at 20. Co-ox 71% Weight coming down but still with significant volume overload. Creatinine stable at 1.3  Will continue IV diuresis. Supp K.   [name], MD  6:48 PM

## 2018-12-08 NOTE — Progress Notes (Signed)
[place] for Warfarin Indication: atrial fibrillation  Allergies  Allergen Reactions  . Actos [Pioglitazone] Other (See Comments)    Made the patient retain fluid    Patient Measurements: Height: 5\' 7"  (170.2 cm) Weight: (!) 328 lb 14.8 oz (149.2 kg) IBW/kg (Calculated) : 66.1  Vital Signs: Temp: 97.6 F (36.4 C) (10/02 0441) Temp Source: Oral (10/02 0441) BP: 123/65 (10/02 0441) Pulse Rate: 91 (10/02 0452)  Labs: Recent Labs    12/05/18 1403  12/05/18 1825 12/05/18 2333 12/06/18 0300 12/07/18 0812 12/07/18 1626 12/08/18 0540  HGB 10.3*  --   --   --  10.3*  --   --   --   HCT 35.1*  --   --  35.2* 35.8*  --   --   --   PLT 388  --   --   --  408*  --   --   --   LABPROT  --    < > 27.1*  --  25.0* 22.1*  --  21.9*  INR  --    < > 2.6*  --  2.3* 2.0*  --  1.9*  CREATININE 1.33*  --   --   --  1.34*  --  1.31* 1.35*  TROPONINIHS 19*  --  19*  --   --   --   --   --    < > = values in this interval not displayed.    Estimated Creatinine Clearance: 74.6 mL/min (A) (by C-G formula based on SCr of 1.35 mg/dL (H)).   Assessment: 68 y.o. male presenting with SOB. Pt continues on warfarin for history of afib. INR is below goal at 1.9. No bleeding noted.   PTA warfarin dose 7.5mg  TThSaSu, 5mg  MWF  Goal of Therapy:  INR 2-3 Monitor platelets by anticoagulation protocol: Yes   Plan:  Warfarin 7.5 mg PO again tonight Daily INR  Erin Hearing PharmD., BCPS Clinical Pharmacist 12/08/2018 8:57 AM

## 2018-12-08 NOTE — Progress Notes (Addendum)
Inpatient Diabetes Program Recommendations  AACE/ADA: New Consensus Statement on Inpatient Glycemic Control (2015)  Target Ranges:  Prepandial:   less than 140 mg/dL      Peak postprandial:   less than 180 mg/dL (1-2 hours)      Critically ill patients:  140 - 180 mg/dL   Lab Results  Component Value Date   GLUCAP 167 (H) 12/08/2018   HGBA1C 13.3 (H) 12/06/2018    Review of Glycemic Control Results for ELVIN, GIOVINO (MRN [id number]) as of 12/08/2018 14:46  Ref. Range 12/07/2018 22:12 12/08/2018 04:38 12/08/2018 06:38 12/08/2018 11:33  Glucose-Capillary Latest Ref Range: 70 - 99 mg/dL 67 (L) 89 150 (H) 167 (H)   Diabetes history: Type 2 DM Outpatient Diabetes medications: Novolin 70/30 65 units QAM, Novolin R 20 units with lunch and dinner Current orders for Inpatient glycemic control: Levemir 30 units BID, Novolog 20 units TID, Novolog 0-15 units TID  Inpatient Diabetes Program Recommendations:    Spoke with patient regarding outpatient diabetes management. Patient reports that he has been struggling with his diabetes ever since the pandemic started. He states, "I recently was changed over to 70/30 due to being in the doughnut hole, getting in to see my MD, and finding the correct insulin pen needles because when I have been giving myself injections the insulin is not absorbing because of the needle bending. I finally found stronger needles that do not bend and am working on making an improvement."   Reviewed patient's current A1c of 13.3%. Explained what a A1c is and what it measures. Also reviewed goal A1c with patient, importance of good glucose control @ home, and blood sugar goals. Reviewed patho of DM, need for insulin, role of pancreas, differences between NPH and regular insulin, action time and benefits of 70/30, vascular changes and commorbidities. Patient checks CBGs and reports them being high. He says, "I am afraid of re-injecting with another insulin pen needle because I  don't know how much insulin I've received, so I am afraid of having lows." Denies frequent lows. Patient now feel comfortable with new needles and has not experienced further bending. Discussed at length Relion products, 70/30- when to take and length of action and Regular insulin. Feel this may be a reliable insulin method at discharge. Patient states he now has the supplies he needs for injection and meter supplies. Attached Relion information to DC summary. Encouraged patient with his attempts at establishing control, stressed the importance of following with his PCP and reviewed when to call MD.   Secure chat sent to MD to discuss 70/30 at discharge, patient struggling with insulin absorption and being in doughnut hole. Could consider Novolog 70/30 35 units BID and Novolog 10 units with lunch while inpatient and titrate as necessary.    Thanks, Bronson Curb, MSN, RNC-OB Diabetes Coordinator [phone] (8a-5p)

## 2018-12-08 NOTE — Progress Notes (Signed)
Orthopedic Tech Progress Note Patient Details:  Genesis Pirl 1950-04-21 [id number] RN called requested UNNA BOOTS which was ordered on the 30th of Sept. Went down to materials and they said they do not have any and they believe its on the back order and is not sure when it will be here in the hospital. Molli Posey long to see if we could borrow from them and they only have 6 boxes. None to spare.  Patient ID: Sargon Maggs, male   DOB: 07-08-1950, 68 y.o.   MRN: [id number]   Janit Pagan 12/08/2018, 9:20 AM

## 2018-12-08 NOTE — Patient Outreach (Signed)
[facility]) Care Management  12/08/2018  Trasean Holton Jul 09, 1950 [id number]     Transition of Care Referral  Referral Date: 12/06/2018 Referral Source: Inpatient CM Date of Admission: 12/05/2018 Diagnosis:SOB Date of Discharge: Pending Facility: [place]: [facility]    Referral received. Patient remains inpatient at this time.     Plan: RN CM will follow patient's progress and discharge plans.   Enzo Montgomery, RN,BSN,CCM [facility] Telephonic Care Management Coordinator Direct Phone: [phone] Toll Free: [phone] Fax: [phone]

## 2018-12-09 DIAGNOSIS — I5023 Acute on chronic systolic (congestive) heart failure: Secondary | ICD-10-CM

## 2018-12-09 LAB — PROTIME-INR
INR: 2.1 — ABNORMAL HIGH (ref 0.8–1.2)
Prothrombin Time: 22.9 seconds — ABNORMAL HIGH (ref 11.4–15.2)

## 2018-12-09 LAB — GLUCOSE, CAPILLARY
Glucose-Capillary: 154 mg/dL — ABNORMAL HIGH (ref 70–99)
Glucose-Capillary: 178 mg/dL — ABNORMAL HIGH (ref 70–99)
Glucose-Capillary: 146 mg/dL — ABNORMAL HIGH (ref 70–99)
Glucose-Capillary: 210 mg/dL — ABNORMAL HIGH (ref 70–99)

## 2018-12-09 LAB — BASIC METABOLIC PANEL
Anion gap: 8 (ref 5–15)
BUN: 25 mg/dL — ABNORMAL HIGH (ref 8–23)
CO2: 29 mmol/L (ref 22–32)
Calcium: 8.2 mg/dL — ABNORMAL LOW (ref 8.9–10.3)
Chloride: 93 mmol/L — ABNORMAL LOW (ref 98–111)
Creatinine, Ser: 1.4 mg/dL — ABNORMAL HIGH (ref 0.61–1.24)
GFR calc Af Amer: 60 mL/min — ABNORMAL LOW (ref >60)
GFR calc non Af Amer: 52 mL/min — ABNORMAL LOW (ref >60)
Glucose, Bld: 275 mg/dL — ABNORMAL HIGH (ref 70–99)
Potassium: 4.3 mmol/L (ref 3.5–5.1)
Sodium: 130 mmol/L — ABNORMAL LOW (ref 135–145)

## 2018-12-09 LAB — COOXEMETRY PANEL
Carboxyhemoglobin: 1.8 % — ABNORMAL HIGH (ref 0.5–1.5)
Methemoglobin: 1.3 % (ref 0.0–1.5)
O2 Saturation: 59.5 %
Total hemoglobin: 10.8 g/dL — ABNORMAL LOW (ref 12.0–16.0)

## 2018-12-09 MED ORDER — INSULIN ASPART 100 UNIT/ML ~~LOC~~ SOLN
7.0000 [IU] | Freq: Three times a day (TID) | SUBCUTANEOUS | Status: DC
  Administered 2018-12-09 – 2018-12-14 (×15): 7 [IU] via SUBCUTANEOUS

## 2018-12-09 MED ORDER — GUAIFENESIN ER 600 MG PO TB12
600.0000 mg | ORAL_TABLET | Freq: Two times a day (BID) | ORAL | Status: DC
  Administered 2018-12-09 – 2018-12-14 (×10): 600 mg via ORAL
  Filled 2018-12-09 (×10): qty 1

## 2018-12-09 MED ORDER — WARFARIN SODIUM 7.5 MG PO TABS
7.5000 mg | ORAL_TABLET | Freq: Once | ORAL | Status: AC
  Administered 2018-12-09: 17:00:00 7.5 mg via ORAL
  Filled 2018-12-09: qty 1

## 2018-12-09 NOTE — Progress Notes (Signed)
Advanced Heart Failure Rounding Note  PCP-Cardiologist: [name], MD   Subjective:    Feels improved with improved breathing, but remains on nasal cannula.  Weight has remained stable.  Continues to have significant volume overload.  Objective:   Weight Range: (!) 148.8 kg Body mass index is 51.38 kg/m.   Vital Signs:   Temp:  [97.9 F (36.6 C)-98.8 F (37.1 C)] 98.1 F (36.7 C) (10/03 1127) Pulse Rate:  [77-84] 79 (10/03 0509) Resp:  [19-25] 19 (10/03 0509) BP: (87-115)/(53-73) 115/56 (10/03 1127) SpO2:  [93 %-100 %] 97 % (10/03 1127) FiO2 (%):  [28 %] 28 % (10/03 0843) Weight:  [148.8 kg] 148.8 kg (10/03 0509) Last BM Date: 12/08/18  Weight change: Filed Weights   12/07/18 0603 12/08/18 0452 12/09/18 0509  Weight: (!) 146.2 kg (!) 149.2 kg (!) 148.8 kg    Intake/Output:   Intake/Output Summary (Last 24 hours) at 12/09/2018 1304 Last data filed at 12/09/2018 1127 Gross per 24 hour  Intake 240 ml  Output 3850 ml  Net -3610 ml      Physical Exam    General:  Morbidly obese male, Well appearing. No resp difficulty. On [place] HEENT: Normal anicteric Neck: thick neck, unable to assess JVD . Carotids 2+ bilat; no bruits. No lymphadenopathy or thyromegaly appreciated. Cor: PMI nondisplaced. Regular rate & rhythm. No rubs, gallops or murmurs. Lungs: faint bibasilar crackles no wheeze Abdomen: obese, tense/distended, non tender. No hepatosplenomegaly. No bruits or masses. Good bowel sounds. Extremities: No cyanosis, clubbing, 2-3+ LEE w/ chronic venous stasis dermatitis  Neuro: alert & oriented x 3, cranial nerves grossly intact. moves all 4 extremities w/o difficulty. Affect pleasant  GEN: Morbidly obese, well-appearing HEENT: normal  Neck: Unable to assess JVD, carotid bruits, or masses Cardiac: RRR; no murmurs, rubs, or gallops,2-3 plus lower extremity edema Respiratory: Faint bibasilar crackles GI: soft, nontender, nondistended, + BS MS: no deformity  or atrophy  Skin: warm and dry Neuro:  Strength and sensation are intact Psych: euthymic mood, full affect  Telemetry   Sinus rhythm with PVCs-personally reviewed  EKG    None new  Labs    CBC No results for input(s): WBC, NEUTROABS, HGB, HCT, MCV, PLT in the last 72 hours. Basic Metabolic Panel Recent Labs    12/08/18 0540 12/09/18 0439  NA 136 130*  K 3.7 4.3  CL 95* 93*  CO2 30 29  GLUCOSE 203* 275*  BUN 21 25*  CREATININE 1.35* 1.40*  CALCIUM 8.4* 8.2*   Liver Function Tests No results for input(s): AST, ALT, ALKPHOS, BILITOT, PROT, ALBUMIN in the last 72 hours. No results for input(s): LIPASE, AMYLASE in the last 72 hours. Cardiac Enzymes No results for input(s): CKTOTAL, CKMB, CKMBINDEX, TROPONINI in the last 72 hours.  BNP: BNP (last 3 results) Recent Labs    12/05/18 1825  BNP 111.3*    ProBNP (last 3 results) No results for input(s): PROBNP in the last 8760 hours.   D-Dimer No results for input(s): DDIMER in the last 72 hours. Hemoglobin A1C No results for input(s): HGBA1C in the last 72 hours. Fasting Lipid Panel No results for input(s): CHOL, HDL, LDLCALC, TRIG, CHOLHDL, LDLDIRECT in the last 72 hours. Thyroid Function Tests No results for input(s): TSH, T4TOTAL, T3FREE, THYROIDAB in the last 72 hours.  Invalid input(s): FREET3  Other results: 2D Echo 12/06/18 Definity contrast agent was given IV to delineate the left ventricular endocardial borders. 2. Left ventricular ejection fraction, by visual estimation,  is 25 to 30%. The left ventricle has severely decreased function. Normal left ventricular size. There is no left ventricular hypertrophy. Technically difficult study with poor acoustic windows. There appears to be diffuse hypokinesis. Mildly D-shaped interventricular septum suggestive of RV pressure/volume overload. 3. Left ventricular diastolic Doppler parameters are consistent with impaired relaxation pattern of LV diastolic  filling. 4. Mild mitral annular calcification. 5. The mitral valve is normal in structure. Trace mitral valve regurgitation. No evidence of mitral stenosis. 6. The tricuspid valve is normal in structure. Tricuspid valve regurgitation is trivial. 7. The aortic valve is tricuspid Aortic valve regurgitation was not visualized by color flow Doppler. Mild aortic valve stenosis. Mean gradient 13 mmHg. 8. Global right ventricle has moderately reduced systolic function.The right ventricular size is moderately enlarged. No increase in right ventricular wall thickness. 9. The inferior vena cava is dilated in size with <50% respiratory variability, suggesting right atrial pressure 15 mmHg. 10. The tricuspid regurgitant velocity is 2.97 m/s, and with an assumed right atrial pressure of 15 mmHg, the estimated right ventricular systolic pressure is moderately elevated at 50.3 mmHg. 11. Left atrial size was mildly dilated. 12. The interatrial septum was not well visualized.   Imaging    No results found.   Medications:     Scheduled Medications: . amiodarone  200 mg Oral Daily  . atorvastatin  40 mg Oral Daily  . Chlorhexidine Gluconate Cloth  6 each Topical Daily  . dextromethorphan-guaiFENesin  2 tablet Oral BID  . insulin aspart  0-15 Units Subcutaneous TID WC  . insulin aspart  7 Units Subcutaneous TID WC  . insulin detemir  30 Units Subcutaneous BID  . sacubitril-valsartan  1 tablet Oral BID  . sodium chloride flush  10-40 mL Intracatheter Q12H  . sodium chloride HYPERTONIC  4 mL Nebulization Daily  . spironolactone  12.5 mg Oral Daily  . traMADol  50 mg Oral QHS  . warfarin  7.5 mg Oral ONCE-1800  . Warfarin - Pharmacist Dosing Inpatient   Does not apply q1800    Infusions: . furosemide (LASIX) infusion 20 mg/hr (12/08/18 2230)    PRN Medications: acetaminophen, guaiFENesin-dextromethorphan, sodium chloride flush  Patient Profile   Raymond Pratt is a 68 y/o morbidly obese  WM, with positive PMH of chronic combined systolic and diastolic CHF/ NICM ([id number]) Echo: EF 20-25%, A-fib w/ RVR (2010 s/p failed cardioversion), now on amiodarone, chronic anticoagulation on coumadin w/ INRs followed by PCP, IDDM, HTN and OSA noncompliant w/ CPAP who was lost to f/u in the [facility] and not seen since 2015, now presenting w/ acute on chronic systolic HF, for which AHF has been consulted, at the request of Dr. Nevada Crane, Internal Medicine.    Assessment/Plan   1. Acute on Chronic Systolic and Diastolic CHF: Diagnosed in 2013.  Left heart catheterization showed normal coronary arteries at that time.  Remains massively volume overloaded.  Has had good urine output.  We Janiqua Friscia continue with Lasix drip.  Would continue to hold lisinopril.  Currently on Entresto.   2. PAF: In sinus rhythm with PVCs.  Continue amiodarone and Coumadin.    3. OSA: Noncompliant with CPAP and refusing as an inpatient   4. IDDM: Poorly controlled currently getting insulin.  Plan per primary medicine team.    Length of Stay: 5 Raymond Capshaw Meredith Leeds, MD  12/09/2018, 1:04 PM  Advanced Heart Failure Team Pager [phone] (M-F; 7a - 4p)  Please contact [facility] for night-coverage after hours (4p -7a ) and  weekends on [email or web address]

## 2018-12-09 NOTE — Progress Notes (Signed)
Patient refused CPAP for tonight. No respiratory distress noted. RT will monitor as needed. 

## 2018-12-09 NOTE — Progress Notes (Signed)
Pt refuses CPAP 

## 2018-12-09 NOTE — Progress Notes (Signed)
Called to pt room screaming in pain. PT had been complaining that his UNA boots were hurting his leg bad since shift change. Now pt stated its hurting his knee and whole body. HE cant take the pain anymore and said take those bandage off " if you don't take it off I will take them off". I took those bandages off of BLE, scheduled pain medication administered. Pt wanted to see Dr. Haroldine Laws, I told him he is not here today. BLE elevated in chair. Call bell within reach. Will continue to monitor.

## 2018-12-09 NOTE — Progress Notes (Signed)
[place] for Warfarin Indication: atrial fibrillation  Allergies  Allergen Reactions  . Actos [Pioglitazone] Other (See Comments)    Made the patient retain fluid    Patient Measurements: Height: 5\' 7"  (170.2 cm) Weight: (!) 328 lb 0.7 oz (148.8 kg) IBW/kg (Calculated) : 66.1  Vital Signs: Temp: 98.1 F (36.7 C) (10/03 1127) Temp Source: Oral (10/03 1127) BP: 115/56 (10/03 1127) Pulse Rate: 79 (10/03 0509)  Labs: Recent Labs    12/07/18 0812 12/07/18 1626 12/08/18 0540 12/09/18 0439  LABPROT 22.1*  --  21.9* 22.9*  INR 2.0*  --  1.9* 2.1*  CREATININE  --  1.31* 1.35* 1.40*    Estimated Creatinine Clearance: 71.8 mL/min (A) (by C-G formula based on SCr of 1.4 mg/dL (H)).   Assessment: 68 y.o. male presenting with SOB. Pt continues on warfarin for history of afib. INR is at goal @ 2.1. No bleeding noted.   PTA warfarin dose 7.5mg  TThSaSu, 5mg  MWF  Goal of Therapy:  INR 2-3 Monitor platelets by anticoagulation protocol: Yes   Plan:  Warfarin 7.5 mg PO again tonight Daily INR Monitor CBC   Sherren Kerns, PharmD PGY1 Acute Care Pharmacy Resident 12/09/2018 12:48 PM

## 2018-12-09 NOTE — Progress Notes (Signed)
PROGRESS NOTE  Raymond Pratt [id number] DOB: [birth year] DOA: 12/05/2018 PCP: Shon Baton, MD  HPI/Recap of past 24 hours:  Raymond Pratt is a 68 y.o. male with medical history significant of nonischemic cardiomyopathy with EF of 15-20 on echo on 02/2014, atrial fibrillation on Coumadin, hypertension, type 2 diabetes, OSA on CPAP who presents with concerns of worsening shortness of breath.  Patient reports that he has had gradual worsening of his shortness of breath for the past several weeks.  He was able to lay flat about 3 weeks ago but has had to prop himself up with more pillows due to shortness of breath at night.  Also notes worsening bilateral lower extremity edema.  Has some abdominal bloating as well.  He reports that he has been taking his torsemide daily and was unaware of Lasix which is on his medication list.  He reports that he was also given metolazone but stopped about a month ago since he was only given about "5 pills."  He denies any fever or chest pain.  Denies any abdominal pain, nausea or vomiting.  ED Course: He was afebrile and normotensive on room air.  CBC showed no leukocytosis and had new anemia 10.3 that is decreased from a 13.9 four years ago.  Troponin flat at 19.  BNP of 111.  INR of 2.6.  Chest x-ray showed cardiomegaly with mild pulmonary vascular congestion.  EKG shows sinus rhythm with first-degree AV block with occasional PVCs with prolonged QTc of 482.   Significant volume overload, seen by advanced heart failure team, following.    12/08/18: Patient was seen and examined at bedside this morning.  No acute events overnight.  Report dyspnea with minimal exertion.  Not on oxygen supplementation at baseline.  Reports noncompliance with CPAP at home.  Ongoing diuresing, on insulin drip.  12/09/18: Patient was seen and examined at his bedside this morning.  States his breathing better, ongoing diuresing.  Net I&O -6.8 L.  Denies chest pain or palpitations.   Assessment/Plan: Active Problems:   Type 2 diabetes mellitus with hyperlipidemia (HCC)   OSA on CPAP   Anticoagulated on Coumadin   CHF (congestive heart failure) ([place])   Acute on chronic systolic CHF suspect secondary to diet and medication noncompliance. 2D echo done on 12/06/2018 showed LVEF 25 to 30% Ongoing diuresing net I&O -6.8 L Currently on IV Lasix 80 mg twice daily Continue strict I's and O's and daily weight Continue to monitor electrolytes, blood pressure, renal function, daily BMPs  Massive volume overload secondary to acute on chronic systolic CHF Management as stated above  Improving acute hypoxic respiratory failure likely secondary to pulmonary edema from acute on chronic systolic CHF Improving with diuresis Continue to maintain O2 saturation greater than 92%  Improving intractable cough likely multifactorial secondary to acute on chronic systolic CHF versus others Completed pulmonary toilet with Mucinex 1200 mg twice daily and hypersaline nebs  Hypervolemic hyponatremia Ongoing diuresing still hypervolemic Sodium 130 from 136 yesterday Continue to monitor sodium level  Type 2 diabetes with hyperglycemia A1c 13.3 on 11/09/2018 Continue Levemir 30 units twice daily Start NovoLog 7 units 3 times daily and titrate up as needed Continue to monitor CBGs and avoid hypoglycemia  OSA, noncompliant with CPAP Encourage CPAP use  Chronic bilateral lower extremity pain States he takes tramadol at night for his pain Resume home med  Paroxysmal A. fib on Coumadin In sinus rhythm and rate controlled Toprol held in the setting of volume overload with hypoxia  Continue medications as recommended by the advanced heart failure team INR is therapeutic at 2.1 on 12/09/2018. Pharmacy managing Coumadin  Hyperlipidemia Continue statin  Medical noncompliance Needs education reinforcement  CKD 3 Baseline creatinine 1.3 with GFR 54 Mild elevation in creatinine 1.4 with  GFR of 52 on 12/09/2018 Continue to avoid nephrotoxins Continue to monitor urine output Continue daily BMP  Iron deficiency anemia Iron studies significant for remarkable iron deficiency next Hemoglobin stable at 10 with MCV 90, hemoglobin 13 in 2016 Iron 18 Trans sat 5 No sign of overt bleeding Transfused IV Feraheme 510 mg once  Severe morbid obesity BMI 50 Recommend weight loss outpatient with reg physical activity and healthy dieting   DVT prophylaxis:WARFARIN Code Status:Full Family Communication: None at bedside.    Disposition Plan: Patient is currently not appropriate for discharge at this time due to persistent volume overload in the setting of acute on chronic systolic CHF, ongoing diuresing with IV diuretics, acute hypoxic respiratory failure, in the setting of multiple comorbidities and advanced age.      Consults called: Cardiology    Objective: Vitals:   12/09/18 0023 12/09/18 0400 12/09/18 0509 12/09/18 0843  BP: (!) 87/53 (!) 100/57    Pulse: 77  79   Resp: (!) 25  19   Temp: 97.9 F (36.6 C) 98.1 F (36.7 C)    TempSrc: Oral Oral    SpO2: 96%  96% 100%  Weight:   (!) 148.8 kg   Height:        Intake/Output Summary (Last 24 hours) at 12/09/2018 0929 Last data filed at 12/09/2018 0400 Gross per 24 hour  Intake 480 ml  Output 3950 ml  Net -3470 ml   Filed Weights   12/07/18 0603 12/08/18 0452 12/09/18 0509  Weight: (!) 146.2 kg (!) 149.2 kg (!) 148.8 kg    Exam:  . General: 68 y.o. year-old male morbidly obese in no acute distress.  Alert and oriented x3.   . Cardiovascular: Regular Rate and Rhythm No Rubs or Gallops No JVD or Thyromegaly Noted. Marland Kitchen Respiratory: Mild rales at bases no wheezing noted.  Poor inspiratory effort.   . Abdomen: Obese nontender nondistended normal bowel sounds present. . Musculoskeletal: Bilateral lower extremities wrapped with unna boots. Marland Kitchen Psychiatry: Mood is appropriate for condition and setting.   Data  Reviewed: CBC: Recent Labs  Lab 12/05/18 1403 12/05/18 2333 12/06/18 0300  WBC 9.9  --  10.6*  HGB 10.3*  --  10.3*  HCT 35.1* 35.2* 35.8*  MCV 90.0  --  90.2  PLT 388  --  123XX123*   Basic Metabolic Panel: Recent Labs  Lab 12/05/18 1403 12/06/18 0300 12/07/18 1626 12/08/18 0540 12/09/18 0439  NA 139 135 141 136 130*  K 4.0 3.8 4.2 3.7 4.3  CL 99 98 102 95* 93*  CO2 28 28 30 30 29   GLUCOSE 206* 253* 86 203* 275*  BUN 22 21 23 21  25*  CREATININE 1.33* 1.34* 1.31* 1.35* 1.40*  CALCIUM 8.4* 8.1* 9.0 8.4* 8.2*   GFR: Estimated Creatinine Clearance: 71.8 mL/min (A) (by C-G formula based on SCr of 1.4 mg/dL (H)). Liver Function Tests: Recent Labs  Lab 12/05/18 1825  AST 23  ALT 28  ALKPHOS 69  BILITOT 0.7  PROT 6.9  ALBUMIN 3.1*   No results for input(s): LIPASE, AMYLASE in the last 168 hours. No results for input(s): AMMONIA in the last 168 hours. Coagulation Profile: Recent Labs  Lab 12/05/18 1825 12/06/18 0300 12/07/18  [id number] 12/08/18 0540 12/09/18 0439  INR 2.6* 2.3* 2.0* 1.9* 2.1*   Cardiac Enzymes: No results for input(s): CKTOTAL, CKMB, CKMBINDEX, TROPONINI in the last 168 hours. BNP (last 3 results) No results for input(s): PROBNP in the last 8760 hours. HbA1C: No results for input(s): HGBA1C in the last 72 hours. CBG: Recent Labs  Lab 12/08/18 0638 12/08/18 1133 12/08/18 1701 12/08/18 2027 12/09/18 0638  GLUCAP 150* 167* 234* 257* 154*   Lipid Profile: No results for input(s): CHOL, HDL, LDLCALC, TRIG, CHOLHDL, LDLDIRECT in the last 72 hours. Thyroid Function Tests: No results for input(s): TSH, T4TOTAL, FREET4, T3FREE, THYROIDAB in the last 72 hours. Anemia Panel: No results for input(s): VITAMINB12, FOLATE, FERRITIN, TIBC, IRON, RETICCTPCT in the last 72 hours. Urine analysis:    Component Value Date/Time   COLORURINE YELLOW 01/04/2014 [place] 01/04/2014 0955   LABSPEC 1.014 01/04/2014 0955   PHURINE 7.5 01/04/2014  [place] 01/04/2014 0955   HGBUR NEGATIVE 01/04/2014 0955   HGBUR trace-lysed 08/04/2008 [place] 01/04/2014 [place] 01/04/2014 0955   PROTEINUR NEGATIVE 01/04/2014 0955   UROBILINOGEN 1.0 01/04/2014 0955   NITRITE NEGATIVE 01/04/2014 0955   LEUKOCYTESUR NEGATIVE 01/04/2014 0955   Sepsis Labs: @LABRCNTIP (procalcitonin:4,lacticidven:4)  ) Recent Results (from the past 240 hour(s))  SARS CORONAVIRUS 2 (TAT 6-24 HRS) Nasopharyngeal Nasopharyngeal Swab     Status: None   Collection Time: 12/05/18  6:25 PM   Specimen: Nasopharyngeal Swab  Result Value Ref Range Status   SARS Coronavirus 2 NEGATIVE NEGATIVE Final    Comment: (NOTE) SARS-CoV-2 target nucleic acids are NOT DETECTED. The SARS-CoV-2 RNA is generally detectable in upper and lower respiratory specimens during the acute phase of infection. Negative results do not preclude SARS-CoV-2 infection, do not rule out co-infections with other pathogens, and should not be used as the sole basis for treatment or other patient management decisions. Negative results must be combined with clinical observations, patient history, and epidemiological information. The expected result is Negative. Fact Sheet for Patients: SugarRoll.be Fact Sheet for Healthcare Providers: https://www.woods-mathews.com/ This test is not yet approved or cleared by the [place] FDA and  has been authorized for detection and/or diagnosis of SARS-CoV-2 by FDA under an Emergency Use Authorization (EUA). This EUA will remain  in effect (meaning this test can be used) for the duration of the COVID-19 declaration under Section 56 4(b)(1) of the Act, 21 U.S.C. section 360bbb-3(b)(1), unless the authorization is terminated or revoked sooner. Performed at [facility], [place] [address] [place], [place] [address]       Studies: No results found.  Scheduled  Meds: . amiodarone  200 mg Oral Daily  . atorvastatin  40 mg Oral Daily  . Chlorhexidine Gluconate Cloth  6 each Topical Daily  . dextromethorphan-guaiFENesin  2 tablet Oral BID  . insulin aspart  0-15 Units Subcutaneous TID WC  . insulin aspart  7 Units Subcutaneous TID WC  . insulin detemir  30 Units Subcutaneous BID  . sacubitril-valsartan  1 tablet Oral BID  . sodium chloride flush  10-40 mL Intracatheter Q12H  . sodium chloride HYPERTONIC  4 mL Nebulization Daily  . spironolactone  12.5 mg Oral Daily  . traMADol  50 mg Oral QHS  . Warfarin - Pharmacist Dosing Inpatient   Does not apply q1800    Continuous Infusions: . furosemide (LASIX) infusion 20 mg/hr (12/08/18 2230)     LOS: 4 days  Kayleen Memos, MD [facility] Pager [phone]  If 7PM-7AM, please contact night-coverage [email or web address] Password [email or web address] 12/09/2018, 9:29 AM

## 2018-12-09 NOTE — Plan of Care (Signed)
Poc progressing.  

## 2018-12-10 ENCOUNTER — Encounter (HOSPITAL_COMMUNITY): Payer: Self-pay | Admitting: *Deleted

## 2018-12-10 LAB — BASIC METABOLIC PANEL
Anion gap: 13 (ref 5–15)
BUN: 33 mg/dL — ABNORMAL HIGH (ref 8–23)
CO2: 29 mmol/L (ref 22–32)
Calcium: 8.7 mg/dL — ABNORMAL LOW (ref 8.9–10.3)
Chloride: 89 mmol/L — ABNORMAL LOW (ref 98–111)
Creatinine, Ser: 1.55 mg/dL — ABNORMAL HIGH (ref 0.61–1.24)
GFR calc Af Amer: 53 mL/min — ABNORMAL LOW (ref >60)
GFR calc non Af Amer: 46 mL/min — ABNORMAL LOW (ref >60)
Glucose, Bld: 241 mg/dL — ABNORMAL HIGH (ref 70–99)
Potassium: 4.6 mmol/L (ref 3.5–5.1)
Sodium: 131 mmol/L — ABNORMAL LOW (ref 135–145)

## 2018-12-10 LAB — GLUCOSE, CAPILLARY
Glucose-Capillary: 177 mg/dL — ABNORMAL HIGH (ref 70–99)
Glucose-Capillary: 176 mg/dL — ABNORMAL HIGH (ref 70–99)
Glucose-Capillary: 177 mg/dL — ABNORMAL HIGH (ref 70–99)
Glucose-Capillary: 174 mg/dL — ABNORMAL HIGH (ref 70–99)
Glucose-Capillary: 193 mg/dL — ABNORMAL HIGH (ref 70–99)

## 2018-12-10 LAB — COOXEMETRY PANEL
Carboxyhemoglobin: 1.3 % (ref 0.5–1.5)
Methemoglobin: 0.8 % (ref 0.0–1.5)
O2 Saturation: 51.9 %
Total hemoglobin: 11.8 g/dL — ABNORMAL LOW (ref 12.0–16.0)

## 2018-12-10 LAB — PROTIME-INR
INR: 2 — ABNORMAL HIGH (ref 0.8–1.2)
Prothrombin Time: 22.6 seconds — ABNORMAL HIGH (ref 11.4–15.2)

## 2018-12-10 LAB — URIC ACID: Uric Acid, Serum: 10.3 mg/dL — ABNORMAL HIGH (ref 3.7–8.6)

## 2018-12-10 MED ORDER — SENNOSIDES-DOCUSATE SODIUM 8.6-50 MG PO TABS
2.0000 | ORAL_TABLET | Freq: Two times a day (BID) | ORAL | Status: DC
  Administered 2018-12-10 – 2018-12-13 (×7): 2 via ORAL
  Filled 2018-12-10 (×7): qty 2

## 2018-12-10 MED ORDER — POLYETHYLENE GLYCOL 3350 17 G PO PACK
17.0000 g | PACK | Freq: Every day | ORAL | Status: DC
  Administered 2018-12-10 – 2018-12-12 (×3): 17 g via ORAL
  Filled 2018-12-10 (×3): qty 1

## 2018-12-10 MED ORDER — COLCHICINE 0.6 MG PO TABS
0.6000 mg | ORAL_TABLET | Freq: Two times a day (BID) | ORAL | Status: AC
  Administered 2018-12-10 – 2018-12-11 (×3): 0.6 mg via ORAL
  Filled 2018-12-10 (×3): qty 1

## 2018-12-10 MED ORDER — OXYCODONE HCL 5 MG PO TABS
10.0000 mg | ORAL_TABLET | Freq: Four times a day (QID) | ORAL | Status: DC | PRN
  Administered 2018-12-10: 16:00:00 5 mg via ORAL
  Administered 2018-12-10: 10 mg via ORAL
  Filled 2018-12-10 (×2): qty 2

## 2018-12-10 MED ORDER — OXYCODONE HCL 5 MG PO TABS
5.0000 mg | ORAL_TABLET | Freq: Four times a day (QID) | ORAL | Status: DC | PRN
  Administered 2018-12-10 (×2): 5 mg via ORAL
  Filled 2018-12-10 (×2): qty 1

## 2018-12-10 MED ORDER — HYDROMORPHONE HCL 1 MG/ML IJ SOLN
0.5000 mg | Freq: Three times a day (TID) | INTRAMUSCULAR | Status: DC | PRN
  Administered 2018-12-10: 19:00:00 0.5 mg via INTRAVENOUS
  Filled 2018-12-10: qty 1

## 2018-12-10 MED ORDER — TRAMADOL HCL 50 MG PO TABS
50.0000 mg | ORAL_TABLET | Freq: Once | ORAL | Status: AC
  Administered 2018-12-10: 50 mg via ORAL
  Filled 2018-12-10: qty 1

## 2018-12-10 MED ORDER — WARFARIN SODIUM 7.5 MG PO TABS
7.5000 mg | ORAL_TABLET | Freq: Once | ORAL | Status: AC
  Administered 2018-12-10: 7.5 mg via ORAL
  Filled 2018-12-10: qty 1

## 2018-12-10 NOTE — Progress Notes (Signed)
Advanced Heart Failure Rounding Note  PCP-Cardiologist: [name], MD   Subjective:    Breathing has improved.  Weight has remained stable.  He is out net 9.5 mL.  Had lower extremity pain yesterday and had his Unna boot removed.  Objective:   Weight Range: (!) 147.2 kg Body mass index is 50.82 kg/m.   Vital Signs:   Temp:  [97.6 F (36.4 C)-98.2 F (36.8 C)] 98.2 F (36.8 C) (10/04 0504) Pulse Rate:  [70-87] 87 (10/04 0504) Resp:  [14-26] 19 (10/04 0504) BP: (97-124)/(43-65) 105/62 (10/04 0504) SpO2:  [92 %-99 %] 97 % (10/04 0504) Weight:  [147.2 kg] 147.2 kg (10/04 0520) Last BM Date: 12/09/18  Weight change: Filed Weights   12/08/18 0452 12/09/18 0509 12/10/18 0520  Weight: (!) 149.2 kg (!) 148.8 kg (!) 147.2 kg    Intake/Output:   Intake/Output Summary (Last 24 hours) at 12/10/2018 0947 Last data filed at 12/10/2018 0900 Gross per 24 hour  Intake 640.02 ml  Output 2900 ml  Net -2259.98 ml      Physical Exam    GEN: Well nourished, well developed, in no acute distress  HEENT: normal  Neck: no JVD, carotid bruits, or masses Cardiac: RRR; no murmurs, rubs, or gallops, 2-3+ lower extremity edema to the knee Respiratory:  clear to auscultation bilaterally, normal work of breathing GI: soft, nontender, nondistended, + BS MS: no deformity or atrophy  Skin: warm and dry Neuro:  Strength and sensation are intact Psych: euthymic mood, full affect   Telemetry   Sinus rhythm with PVCs-personally reviewed  EKG    None new  Labs    CBC No results for input(s): WBC, NEUTROABS, HGB, HCT, MCV, PLT in the last 72 hours. Basic Metabolic Panel Recent Labs    12/09/18 0439 12/10/18 0525  NA 130* 131*  K 4.3 4.6  CL 93* 89*  CO2 29 29  GLUCOSE 275* 241*  BUN 25* 33*  CREATININE 1.40* 1.55*  CALCIUM 8.2* 8.7*   Liver Function Tests No results for input(s): AST, ALT, ALKPHOS, BILITOT, PROT, ALBUMIN in the last 72 hours. No results for  input(s): LIPASE, AMYLASE in the last 72 hours. Cardiac Enzymes No results for input(s): CKTOTAL, CKMB, CKMBINDEX, TROPONINI in the last 72 hours.  BNP: BNP (last 3 results) Recent Labs    12/05/18 1825  BNP 111.3*    ProBNP (last 3 results) No results for input(s): PROBNP in the last 8760 hours.   D-Dimer No results for input(s): DDIMER in the last 72 hours. Hemoglobin A1C No results for input(s): HGBA1C in the last 72 hours. Fasting Lipid Panel No results for input(s): CHOL, HDL, LDLCALC, TRIG, CHOLHDL, LDLDIRECT in the last 72 hours. Thyroid Function Tests No results for input(s): TSH, T4TOTAL, T3FREE, THYROIDAB in the last 72 hours.  Invalid input(s): FREET3  Other results: 2D Echo 12/06/18 Definity contrast agent was given IV to delineate the left ventricular endocardial borders. 2. Left ventricular ejection fraction, by visual estimation, is 25 to 30%. The left ventricle has severely decreased function. Normal left ventricular size. There is no left ventricular hypertrophy. Technically difficult study with poor acoustic windows. There appears to be diffuse hypokinesis. Mildly D-shaped interventricular septum suggestive of RV pressure/volume overload. 3. Left ventricular diastolic Doppler parameters are consistent with impaired relaxation pattern of LV diastolic filling. 4. Mild mitral annular calcification. 5. The mitral valve is normal in structure. Trace mitral valve regurgitation. No evidence of mitral stenosis. 6. The tricuspid valve is  normal in structure. Tricuspid valve regurgitation is trivial. 7. The aortic valve is tricuspid Aortic valve regurgitation was not visualized by color flow Doppler. Mild aortic valve stenosis. Mean gradient 13 mmHg. 8. Global right ventricle has moderately reduced systolic function.The right ventricular size is moderately enlarged. No increase in right ventricular wall thickness. 9. The inferior vena cava is dilated in size with  <50% respiratory variability, suggesting right atrial pressure 15 mmHg. 10. The tricuspid regurgitant velocity is 2.97 m/s, and with an assumed right atrial pressure of 15 mmHg, the estimated right ventricular systolic pressure is moderately elevated at 50.3 mmHg. 11. Left atrial size was mildly dilated. 12. The interatrial septum was not well visualized.   Imaging    No results found.   Medications:     Scheduled Medications: . amiodarone  200 mg Oral Daily  . atorvastatin  40 mg Oral Daily  . Chlorhexidine Gluconate Cloth  6 each Topical Daily  . colchicine  0.6 mg Oral BID  . guaiFENesin  600 mg Oral BID  . insulin aspart  0-15 Units Subcutaneous TID WC  . insulin aspart  7 Units Subcutaneous TID WC  . insulin detemir  30 Units Subcutaneous BID  . sacubitril-valsartan  1 tablet Oral BID  . sodium chloride flush  10-40 mL Intracatheter Q12H  . spironolactone  12.5 mg Oral Daily  . traMADol  50 mg Oral QHS  . Warfarin - Pharmacist Dosing Inpatient   Does not apply q1800    Infusions: . furosemide (LASIX) infusion 20 mg/hr (12/10/18 0900)    PRN Medications: acetaminophen, guaiFENesin-dextromethorphan, oxyCODONE, sodium chloride flush  Patient Profile   [name] is a 68 y/o morbidly obese WM, with positive PMH of chronic combined systolic and diastolic CHF/ NICM ([id number]) Echo: EF 20-25%, A-fib w/ RVR (2010 s/p failed cardioversion), now on amiodarone, chronic anticoagulation on coumadin w/ INRs followed by PCP, IDDM, HTN and OSA noncompliant w/ CPAP who was lost to f/u in the [facility] and not seen since 2015, now presenting w/ acute on chronic systolic HF, for which AHF has been consulted, at the request of Dr. Nevada Crane, Internal Medicine.    Assessment/Plan   1. Acute on Chronic Systolic and Diastolic CHF: Diagnosed in 2013.  Catheterization at that time showed normal coronary arteries.  Remains massively volume overloaded.  Is net out 9.5 L but has quite a bit left to go.   Unfortunately he had his into boots removed yesterday due to lower extremity pain.  We will continue IV Lasix at his current dose as he has quite a bit of diuresis left.  Should his creatinine continue to rise, it may be worth holding Entresto for a short time.   2. PAF: Sinus rhythm with PVCs.  Continue amiodarone and Coumadin.  3. OSA: Noncompliant with CPAP and refusing as an inpatient.  Likely contributing to his heart failure symptoms.  4. IDDM: Poorly controlled and getting insulin.  Plan per primary team.  Length of Stay: 5 Will Meredith Leeds, MD  12/10/2018, 9:47 AM  Advanced Heart Failure Team Pager [phone] (M-F; 7a - 4p)  Please contact [facility] for night-coverage after hours (4p -7a ) and weekends on [email or web address]

## 2018-12-10 NOTE — Progress Notes (Signed)
PROGRESS NOTE  Raymond Pratt [id number] DOB: 1950/10/29 DOA: 12/05/2018 PCP: Shon Baton, MD  HPI/Recap of past 24 hours:  Raymond Pratt is a 68 y.o. male with medical history significant of nonischemic cardiomyopathy with EF of 15-20 on echo on 02/2014, atrial fibrillation on Coumadin, hypertension, type 2 diabetes, OSA on CPAP who presents with concerns of worsening shortness of breath.  Patient reports that he has had gradual worsening of his shortness of breath for the past several weeks.  He was able to lay flat about 3 weeks ago but has had to prop himself up with more pillows due to shortness of breath at night.  Also notes worsening bilateral lower extremity edema.  Has some abdominal bloating as well.  He reports that he has been taking his torsemide daily and was unaware of Lasix which is on his medication list.  He reports that he was also given metolazone but stopped about a month ago since he was only given about "5 pills."  He denies any fever or chest pain.  Denies any abdominal pain, nausea or vomiting.  ED Course: He was afebrile and normotensive on room air.  CBC showed no leukocytosis and had new anemia 10.3 that is decreased from a 13.9 four years ago.  Troponin flat at 19.  BNP of 111.  INR of 2.6.  Chest x-ray showed cardiomegaly with mild pulmonary vascular congestion.  EKG shows sinus rhythm with first-degree AV block with occasional PVCs with prolonged QTc of 482.   Significant volume overload, seen by advanced heart failure team, following.    Noncompliant with CPAP.  12/10/18: Patient was seen and examined at his bedside this morning.  Significant bilateral lower extremity pain overnight.  This morning he reports joint pain that feels like previous gout.  Obtained uric acid level and started on colchicine.  Pain management in place with bowel regimen to avoid opiate-induced constipation.  Assessment/Plan: Active Problems:   Type 2 diabetes mellitus with  hyperlipidemia (HCC)   OSA on CPAP   Anticoagulated on Coumadin   CHF (congestive heart failure) (HCC)   Acute on chronic systolic CHF with massive volume overload suspect secondary to diet and medication noncompliance. 2D echo done on 12/06/2018 showed LVEF 25 to 30% Net I&O -9.7 L Diuretics on hold per cardiology Continue strict I's and O's and daily weight Continue to monitor electrolytes, blood pressure, renal function, daily BMPs  CKD 3 Baseline creatinine appears to be 1.3 with GFR 54 Creatinine today 1.55 with GFR 46 Diuretics currently on hold Continue to monitor urine output Continue to avoid nephrotoxins Daily BMP  Suspected gout Significant bilateral pain in lower extremities affecting multiple joints States feels like previous gout attack Obtain uric acid level Start colchicine x3 days P.o. oxycodone as needed for severe pain Tramadol as needed for moderate pain  Massive volume overload secondary to acute on chronic systolic CHF Management as stated above  Improving acute hypoxic respiratory failure likely secondary to pulmonary edema from acute on chronic systolic CHF Improving with diuresis Continue to maintain O2 saturation greater than 92%  Improving intractable cough likely multifactorial secondary to acute on chronic systolic CHF versus others Completed pulmonary toilet with Mucinex 1200 mg twice daily and hypersaline nebs  Hypervolemic hyponatremia Sodium improving 131 from 130 Continue daily BMPs  Type 2 diabetes with hyperglycemia A1c 13.3 on 11/09/2018 Continue Levemir 30 units twice daily Continue NovoLog 7 units 3 times daily and titrate up as needed Continue to monitor CBGs and avoid  hypoglycemia  OSA, noncompliant with CPAP Encourage CPAP use  Chronic bilateral lower extremity pain States he takes tramadol at night for his pain Resume home med  Paroxysmal A. fib on Coumadin In sinus rhythm and rate controlled Toprol held in the setting  of volume overload with hypoxia Continue medications as recommended by the advanced heart failure team INR is therapeutic 2.0 on 12/10/2018 Pharmacy managing Coumadin  Hyperlipidemia Continue statin  Medical noncompliance Needs education reinforcement  CKD 3 Baseline creatinine 1.3 with GFR 54 Mild elevation in creatinine 1.4 with GFR of 52 on 12/09/2018 Continue to avoid nephrotoxins Continue to monitor urine output Continue daily BMP  Iron deficiency anemia Iron studies significant for remarkable iron deficiency next Hemoglobin stable at 10 with MCV 90, hemoglobin 13 in 2016 Iron 18 Trans sat 5 No sign of overt bleeding Transfused IV Feraheme 510 mg once  Severe morbid obesity BMI 50 Recommend weight loss outpatient with reg physical activity and healthy dieting   DVT prophylaxis:WARFARIN Code Status:Full Family Communication: None at bedside.    Disposition Plan: Patient is currently not appropriate for discharge at this time due to persistent volume overload in the setting of acute on chronic systolic CHF, acute hypoxic respiratory failure, in the setting of multiple comorbidities and advanced age.      Consults called: Cardiology    Objective: Vitals:   12/10/18 0922 12/10/18 1013 12/10/18 1122 12/10/18 1213  BP: (!) 111/49  (!) 84/54 93/76  Pulse: 88  84 84  Resp: 19  14 16   Temp:  98.6 F (37 C) 98.5 F (36.9 C)   TempSrc:  Oral Oral   SpO2: 100%  97% 98%  Weight:      Height:        Intake/Output Summary (Last 24 hours) at 12/10/2018 1414 Last data filed at 12/10/2018 1035 Gross per 24 hour  Intake 640.02 ml  Output 2250 ml  Net -1609.98 ml   Filed Weights   12/08/18 0452 12/09/18 0509 12/10/18 0520  Weight: (!) 149.2 kg (!) 148.8 kg (!) 147.2 kg    Exam:  . General: 68 y.o. year-old male morbidly obese appears uncomfortable due to lower extremities pain bilaterally.  Alert oriented x3.   .  Cardiovascular: Regular rate and rhythm no rubs  or gallops no JVD or thyromegaly.   Marland Kitchen Respiratory: Mild rales at bases no wheezing noted.  Poor inspiratory effort.   . Abdomen: Obese mildly distended bowel sounds present. . Musculoskeletal: Bilateral lower extremity edema.  Tenderness noted at left knee and left ankle.   Marland Kitchen Psychiatry: Mood is appropriate for condition and setting.   Data Reviewed: CBC: Recent Labs  Lab 12/05/18 1403 12/05/18 2333 12/06/18 0300  WBC 9.9  --  10.6*  HGB 10.3*  --  10.3*  HCT 35.1* 35.2* 35.8*  MCV 90.0  --  90.2  PLT 388  --  123XX123*   Basic Metabolic Panel: Recent Labs  Lab 12/06/18 0300 12/07/18 1626 12/08/18 0540 12/09/18 0439 12/10/18 0525  NA 135 141 136 130* 131*  K 3.8 4.2 3.7 4.3 4.6  CL 98 102 95* 93* 89*  CO2 28 30 30 29 29   GLUCOSE 253* 86 203* 275* 241*  BUN 21 23 21  25* 33*  CREATININE 1.34* 1.31* 1.35* 1.40* 1.55*  CALCIUM 8.1* 9.0 8.4* 8.2* 8.7*   GFR: Estimated Creatinine Clearance: 64.4 mL/min (A) (by C-G formula based on SCr of 1.55 mg/dL (H)). Liver Function Tests: Recent Labs  Lab 12/05/18 1825  AST 23  ALT 28  ALKPHOS 69  BILITOT 0.7  PROT 6.9  ALBUMIN 3.1*   No results for input(s): LIPASE, AMYLASE in the last 168 hours. No results for input(s): AMMONIA in the last 168 hours. Coagulation Profile: Recent Labs  Lab 12/06/18 0300 12/07/18 0812 12/08/18 0540 12/09/18 0439 12/10/18 0525  INR 2.3* 2.0* 1.9* 2.1* 2.0*   Cardiac Enzymes: No results for input(s): CKTOTAL, CKMB, CKMBINDEX, TROPONINI in the last 168 hours. BNP (last 3 results) No results for input(s): PROBNP in the last 8760 hours. HbA1C: No results for input(s): HGBA1C in the last 72 hours. CBG: Recent Labs  Lab 12/09/18 1622 12/09/18 2117 12/10/18 0502 12/10/18 0612 12/10/18 1127  GLUCAP 146* 210* 177* 176* 177*   Lipid Profile: No results for input(s): CHOL, HDL, LDLCALC, TRIG, CHOLHDL, LDLDIRECT in the last 72 hours. Thyroid Function Tests: No results for input(s): TSH,  T4TOTAL, FREET4, T3FREE, THYROIDAB in the last 72 hours. Anemia Panel: No results for input(s): VITAMINB12, FOLATE, FERRITIN, TIBC, IRON, RETICCTPCT in the last 72 hours. Urine analysis:    Component Value Date/Time   COLORURINE YELLOW 01/04/2014 [place] 01/04/2014 0955   LABSPEC 1.014 01/04/2014 0955   PHURINE 7.5 01/04/2014 0955   GLUCOSEU NEGATIVE 01/04/2014 0955   HGBUR NEGATIVE 01/04/2014 0955   HGBUR trace-lysed 08/04/2008 [place] 01/04/2014 [place] 01/04/2014 0955   PROTEINUR NEGATIVE 01/04/2014 0955   UROBILINOGEN 1.0 01/04/2014 0955   NITRITE NEGATIVE 01/04/2014 0955   LEUKOCYTESUR NEGATIVE 01/04/2014 0955   Sepsis Labs: @LABRCNTIP (procalcitonin:4,lacticidven:4)  ) Recent Results (from the past 240 hour(s))  SARS CORONAVIRUS 2 (TAT 6-24 HRS) Nasopharyngeal Nasopharyngeal Swab     Status: None   Collection Time: 12/05/18  6:25 PM   Specimen: Nasopharyngeal Swab  Result Value Ref Range Status   SARS Coronavirus 2 NEGATIVE NEGATIVE Final    Comment: (NOTE) SARS-CoV-2 target nucleic acids are NOT DETECTED. The SARS-CoV-2 RNA is generally detectable in upper and lower respiratory specimens during the acute phase of infection. Negative results do not preclude SARS-CoV-2 infection, do not rule out co-infections with other pathogens, and should not be used as the sole basis for treatment or other patient management decisions. Negative results must be combined with clinical observations, patient history, and epidemiological information. The expected result is Negative. Fact Sheet for Patients: SugarRoll.be Fact Sheet for Healthcare Providers: https://www.woods-mathews.com/ This test is not yet approved or cleared by the [place] FDA and  has been authorized for detection and/or diagnosis of SARS-CoV-2 by FDA under an Emergency Use Authorization (EUA). This EUA will remain   in effect (meaning this test can be used) for the duration of the COVID-19 declaration under Section 56 4(b)(1) of the Act, 21 U.S.C. section 360bbb-3(b)(1), unless the authorization is terminated or revoked sooner. Performed at [facility], [place] [address], [place], [place] [address]       Studies: No results found.  Scheduled Meds: . amiodarone  200 mg Oral Daily  . atorvastatin  40 mg Oral Daily  . Chlorhexidine Gluconate Cloth  6 each Topical Daily  . colchicine  0.6 mg Oral BID  . guaiFENesin  600 mg Oral BID  . insulin aspart  0-15 Units Subcutaneous TID WC  . insulin aspart  7 Units Subcutaneous TID WC  . insulin detemir  30 Units Subcutaneous BID  . sacubitril-valsartan  1 tablet Oral BID  . sodium chloride flush  10-40 mL Intracatheter Q12H  .  spironolactone  12.5 mg Oral Daily  . traMADol  50 mg Oral QHS  . warfarin  7.5 mg Oral ONCE-1800  . Warfarin - Pharmacist Dosing Inpatient   Does not apply q1800    Continuous Infusions: . furosemide (LASIX) infusion 20 mg/hr (12/10/18 0900)     LOS: 5 days     Kayleen Memos, MD [facility] Pager [phone]  If 7PM-7AM, please contact night-coverage [email or web address] Password [email or web address] 12/10/2018, 2:14 PM

## 2018-12-10 NOTE — Progress Notes (Signed)
[place] for Warfarin Indication: atrial fibrillation  Allergies  Allergen Reactions  . Actos [Pioglitazone] Other (See Comments)    Made the patient retain fluid    Patient Measurements: Height: 5\' 7"  (170.2 cm) Weight: (!) 324 lb 8 oz (147.2 kg) IBW/kg (Calculated) : 66.1  Vital Signs: Temp: 98.6 F (37 C) (10/04 1013) Temp Source: Oral (10/04 1013) BP: 111/49 (10/04 0922) Pulse Rate: 88 (10/04 0922)  Labs: Recent Labs    12/08/18 0540 12/09/18 0439 12/10/18 0525  LABPROT 21.9* 22.9* 22.6*  INR 1.9* 2.1* 2.0*  CREATININE 1.35* 1.40* 1.55*    Estimated Creatinine Clearance: 64.4 mL/min (A) (by C-G formula based on SCr of 1.55 mg/dL (H)).   Assessment: 68 y.o. male presenting with SOB. Pt continues on warfarin for history of afib. INR is at goal @ 2.0. No bleeding noted.   PTA warfarin dose 7.5mg  TThSaSu, 5mg  MWF  Goal of Therapy:  INR 2-3 Monitor platelets by anticoagulation protocol: Yes   Plan:  Warfarin 7.5 mg PO again tonight Daily INR Monitor CBC   Sherren Kerns, PharmD PGY1 Acute Care Pharmacy Resident 12/10/2018 10:50 AM

## 2018-12-10 NOTE — Progress Notes (Signed)
Went to pt's room to fix his IV, pt stated his legs are still hurting and they had never hurt that bad. Pt stated " I thinks something is really wrong with my legs , I want to see a doctor. " BLE swollen. I had taken UNA boots off BC it was bothering him more. Pt able to move his legs, legs warm to touch and pedal pulses palpable. I verified pedal pulses with doppler. BLE elevated, pt refused cpap and keeps taking is 02 off. Pt asked for more pain medication.  Paged to on call [facility] NP x [place], and notified her what pt had said. Notified pt's vitals stable and pedal pulses are palpable. Received one time order of tramadol. Will continue to monitor.

## 2018-12-11 LAB — COOXEMETRY PANEL
Carboxyhemoglobin: 2.1 % — ABNORMAL HIGH (ref 0.5–1.5)
Methemoglobin: 1 % (ref 0.0–1.5)
O2 Saturation: 81.7 %
Total hemoglobin: 11.7 g/dL — ABNORMAL LOW (ref 12.0–16.0)

## 2018-12-11 LAB — BASIC METABOLIC PANEL
Anion gap: 13 (ref 5–15)
BUN: 46 mg/dL — ABNORMAL HIGH (ref 8–23)
CO2: 28 mmol/L (ref 22–32)
Calcium: 8.9 mg/dL (ref 8.9–10.3)
Chloride: 91 mmol/L — ABNORMAL LOW (ref 98–111)
Creatinine, Ser: 2.13 mg/dL — ABNORMAL HIGH (ref 0.61–1.24)
GFR calc Af Amer: 36 mL/min — ABNORMAL LOW (ref >60)
GFR calc non Af Amer: 31 mL/min — ABNORMAL LOW (ref >60)
Glucose, Bld: 222 mg/dL — ABNORMAL HIGH (ref 70–99)
Potassium: 5.1 mmol/L (ref 3.5–5.1)
Sodium: 132 mmol/L — ABNORMAL LOW (ref 135–145)

## 2018-12-11 LAB — GLUCOSE, CAPILLARY
Glucose-Capillary: 156 mg/dL — ABNORMAL HIGH (ref 70–99)
Glucose-Capillary: 153 mg/dL — ABNORMAL HIGH (ref 70–99)
Glucose-Capillary: 228 mg/dL — ABNORMAL HIGH (ref 70–99)
Glucose-Capillary: 304 mg/dL — ABNORMAL HIGH (ref 70–99)

## 2018-12-11 LAB — PROTIME-INR
INR: 2.4 — ABNORMAL HIGH (ref 0.8–1.2)
Prothrombin Time: 25.6 seconds — ABNORMAL HIGH (ref 11.4–15.2)

## 2018-12-11 MED ORDER — WARFARIN SODIUM 7.5 MG PO TABS
7.5000 mg | ORAL_TABLET | Freq: Once | ORAL | Status: AC
  Administered 2018-12-11: 17:00:00 7.5 mg via ORAL
  Filled 2018-12-11: qty 1

## 2018-12-11 MED ORDER — SODIUM CHLORIDE 0.9% FLUSH: 3.0000 mL | Freq: Two times a day (BID) | INTRAVENOUS | Status: DC

## 2018-12-11 MED ORDER — OXYCODONE HCL 5 MG PO TABS
10.0000 mg | ORAL_TABLET | Freq: Four times a day (QID) | ORAL | Status: DC | PRN
  Administered 2018-12-11: 10:00:00 10 mg via ORAL
  Filled 2018-12-11: qty 2

## 2018-12-11 MED ORDER — SODIUM CHLORIDE 0.9% FLUSH: 3.0000 mL | INTRAVENOUS | Status: DC | PRN

## 2018-12-11 MED ORDER — SODIUM CHLORIDE 0.9 % IV SOLN: 250.0000 mL | INTRAVENOUS | Status: DC | PRN

## 2018-12-11 MED ORDER — SODIUM CHLORIDE 0.9 % IV SOLN
INTRAVENOUS | Status: DC
  Administered 2018-12-12: 06:00:00 via INTRAVENOUS

## 2018-12-11 MED ORDER — METHYLPREDNISOLONE SODIUM SUCC 125 MG IJ SOLR
60.0000 mg | Freq: Every day | INTRAMUSCULAR | Status: DC
  Administered 2018-12-11 – 2018-12-13 (×3): 60 mg via INTRAVENOUS
  Filled 2018-12-11 (×3): qty 2

## 2018-12-11 MED ORDER — PANTOPRAZOLE SODIUM 40 MG PO TBEC
40.0000 mg | DELAYED_RELEASE_TABLET | Freq: Every day | ORAL | Status: DC
  Administered 2018-12-11 – 2018-12-14 (×4): 40 mg via ORAL
  Filled 2018-12-11 (×4): qty 1

## 2018-12-11 MED ORDER — TRAMADOL HCL 50 MG PO TABS
50.0000 mg | ORAL_TABLET | Freq: Once | ORAL | Status: AC
  Administered 2018-12-11: 02:00:00 50 mg via ORAL
  Filled 2018-12-11: qty 1

## 2018-12-11 MED ORDER — HYDROMORPHONE HCL 1 MG/ML IJ SOLN
0.5000 mg | INTRAMUSCULAR | Status: DC | PRN
  Administered 2018-12-11 (×2): 0.5 mg via INTRAVENOUS
  Filled 2018-12-11 (×2): qty 1

## 2018-12-11 MED ORDER — ASPIRIN 81 MG PO CHEW
81.0000 mg | CHEWABLE_TABLET | ORAL | Status: AC
  Administered 2018-12-12: 81 mg via ORAL
  Filled 2018-12-11: qty 1

## 2018-12-11 NOTE — Progress Notes (Signed)
Pt wishes to be DNR. Dr. Nevada Crane notified.

## 2018-12-11 NOTE — Care Management Important Message (Signed)
Important Message  Patient Details  Name: Raymond Pratt MRN: [id number] Date of Birth: 01-14-1951   Medicare Important Message Given:  Yes     [name] 12/11/2018, 2:14 PM

## 2018-12-11 NOTE — Plan of Care (Signed)
Poc progressing.  

## 2018-12-11 NOTE — Progress Notes (Signed)
Advanced Heart Failure Rounding Note  PCP-Cardiologist: [name], MD   Subjective:    Breathing better. CVP 6 this am. Remains on supplemental O2, 1L [place]. Main complaint is hip and knee pain which feels like gout to him. Uric acid elevated at 10.3    Objective:   Weight Range: (!) 147.2 kg Body mass index is 50.82 kg/m.   Vital Signs:   Temp:  [97.5 F (36.4 C)-99.3 F (37.4 C)] 97.5 F (36.4 C) (10/05 1229) Pulse Rate:  [85-100] 98 (10/05 1229) Resp:  [16-23] 18 (10/05 1229) BP: (75-109)/(25-85) 96/50 (10/05 1229) SpO2:  [92 %-98 %] 92 % (10/05 1229) Last BM Date: 12/10/18  Weight change: Filed Weights   12/08/18 0452 12/09/18 0509 12/10/18 0520  Weight: (!) 149.2 kg (!) 148.8 kg (!) 147.2 kg    Intake/Output:   Intake/Output Summary (Last 24 hours) at 12/11/2018 1524 Last data filed at 12/11/2018 1337 Gross per 24 hour  Intake 687.77 ml  Output 1250 ml  Net -562.23 ml      Physical Exam    General:  Super morbidly obese middle aged WM. No resp difficulty HEENT: Normal Neck: thick neck. Difficult to assess JVD . Carotids 2+ bilat; no bruits. No lymphadenopathy or thyromegaly appreciated. Cor: PMI nondisplaced. Regular rate & rhythm. No rubs, gallops or murmurs. Lungs: Clear TAB Abdomen: obese abdomen, slightly distended, nontender. No hepatosplenomegaly. No bruits or masses. Good bowel sounds. Extremities: tense bilateral LE edema w/ chronic venous stasis dermatitis, some weeping edema Neuro: Alert & orientedx3, cranial nerves grossly intact. moves all 4 extremities w/o difficulty. Affect pleasant   Telemetry   Sinus tach 101 w/ occasional PVCs  EKG    No new EKG to review today  Labs    CBC No results for input(s): WBC, NEUTROABS, HGB, HCT, MCV, PLT in the last 72 hours. Basic Metabolic Panel Recent Labs    12/10/18 0525 12/11/18 0500  NA 131* 132*  K 4.6 5.1  CL 89* 91*  CO2 29 28  GLUCOSE 241* 222*  BUN 33* 46*  CREATININE  1.55* 2.13*  CALCIUM 8.7* 8.9   Liver Function Tests No results for input(s): AST, ALT, ALKPHOS, BILITOT, PROT, ALBUMIN in the last 72 hours. No results for input(s): LIPASE, AMYLASE in the last 72 hours. Cardiac Enzymes No results for input(s): CKTOTAL, CKMB, CKMBINDEX, TROPONINI in the last 72 hours.  BNP: BNP (last 3 results) Recent Labs    12/05/18 1825  BNP 111.3*    ProBNP (last 3 results) No results for input(s): PROBNP in the last 8760 hours.   D-Dimer No results for input(s): DDIMER in the last 72 hours. Hemoglobin A1C No results for input(s): HGBA1C in the last 72 hours. Fasting Lipid Panel No results for input(s): CHOL, HDL, LDLCALC, TRIG, CHOLHDL, LDLDIRECT in the last 72 hours. Thyroid Function Tests No results for input(s): TSH, T4TOTAL, T3FREE, THYROIDAB in the last 72 hours.  Invalid input(s): FREET3  Other results:   Imaging    No results found.   Medications:     Scheduled Medications:  amiodarone  200 mg Oral Daily   atorvastatin  40 mg Oral Daily   Chlorhexidine Gluconate Cloth  6 each Topical Daily   guaiFENesin  600 mg Oral BID   insulin aspart  0-15 Units Subcutaneous TID WC   insulin aspart  7 Units Subcutaneous TID WC   insulin detemir  30 Units Subcutaneous BID   methylPREDNISolone (SOLU-MEDROL) injection  60 mg Intravenous Daily  pantoprazole  40 mg Oral Daily   polyethylene glycol  17 g Oral Daily   sacubitril-valsartan  1 tablet Oral BID   senna-docusate  2 tablet Oral BID   sodium chloride flush  10-40 mL Intracatheter Q12H   traMADol  50 mg Oral QHS   warfarin  7.5 mg Oral ONCE-1800   Warfarin - Pharmacist Dosing Inpatient   Does not apply q1800    Infusions:   PRN Medications: acetaminophen, guaiFENesin-dextromethorphan, HYDROmorphone (DILAUDID) injection, oxyCODONE, sodium chloride flush    Patient Profile   [name] a 68 y/o morbidly obese WM,with positive PMH of chronic combined  systolic and diastolic CHF/ [id number]) Echo: EF 20-25%, A-fib w/ RVR (2010 s/p failed cardioversion), now on amiodarone, chronic anticoagulation on coumadin w/ INRs followed by PCP, IDDM, HTN and OSAnoncompliant w/ CPAP who was [facility] to [facility] in [facility] and not seen since 2015, now presenting w/ acute on chronic systolic HF, for which AHF has been consulted, at the request of Dr. Nevada Crane, Internal Medicine.  Assessment/Plan   1. Acute on Chronic Systolic and Diastolic CHF: diagnosed in 2013. LHC 03/06/12 showed normal coronaries. Previous Echo in 2015 w/ EF at 15-20% w/ mod MR and moderately elevated PA pressure 54 mmHg. Presented 12/06/18 w/ 4 week history of progressive bilateral LEE and exertional and resting dyspnea. Volume overloaded on exam w/ 2+ bilateral LEE.BNP only 111 however suspect falsely low given morbid obesity. Chest x-ray demonstrated cardiomegaly with mild pulmonary vascular congestion. Repeat 2D echo 9/30 with persistently low LVEF but slightly improved from prior, now at 25-30% w/ diffuse hypokinesis and moderately elevated RVSP at 50.3 mmHg. - good diuresis w/ Lasix gtt w/ net I/Os -9.7 L total, but diureses is slowing and SCr rising, up from 1.5>>2.13 in 24 hrs . CVP 6.  - Will d/c IV lasix drip today. Reassess in the AM and plan to resume PO diuretics if renal function improves (on torsemide 60 bid PTA) - will hold spironolactone today given AKI -Continue Entresto for now. May need to d/c if renal function does not improve -F/u BMP in the AM. -Continue to hold ? blocker (Toprol XL). Can consider adding back as outpatient.  -Given concomitant T2DM, consider later addition of an SGLT2i (can add as outpatient)  2. PAF: currently NSR. HR in the 90s w/ occasional PVCs -Continue amiodarone 200 mg daily -On coumadin. INR therapeutic today at 2.4 -Continue coumadin dosing per pharmacy  3. OSA: noncompliant w/ CPAP at home - refusing to use while inpatient   4. IDDM: poorly  controlled. Hgb A1c 13.3 - getting insulin - management per IM  -Consider addition of an SGLT2i given concomitant systolic HF w/ EF < [id number]  5. AKI: rise in SCr from 1.55>>2.13 in past 24 hrs. UOP has also slowed in past 24 hrs (previously putting out 3+ L/day, only 1.2L out yesterday. BUN up from 33>>46). May be a bit dry from over diuresis. -will hold IV Lasix today - hold spironolactone -f/u BMP in the AM  6. Acute Gout Flare: h/o gout. Complains of bilateral hip and knee pain c/w prior attacks. Likely triggered by aggressive diuresis w/ lasix. Uric acid elevated at 10.  -Continue colchicine (ordered by primary, 3 day course) -given rise in sCr, will need to monitor closely. If creatinine worsens may need to d/c colchicine and try short course of prednisone. To allopurinol given acute flare.    Length of Stay: [place], [place]  12/11/2018, 3:24 PM  Advanced Heart  Failure Team Pager [phone] (M-F; 7a - 4p)  Please contact [facility] for night-coverage after hours (4p -7a ) and weekends on [email or web address]   Patient seen and examined with the above-signed Advanced Practice Provider and/or Housestaff. I personally reviewed laboratory data, imaging studies and relevant notes. I independently examined the patient and formulated the important aspects of the plan. I have edited the note to reflect any of my changes or salient points. I have personally discussed the plan with the patient and/or family.  Lasix gtt stopped today due to rising creatinine. Co-ox 82% this am but doubt accurate (was 52% yesterday). Still with 2+ edema on exam and CVP 14. Will repeat co-ox. Suspect he will need R/L cath tomorrow to better define hemodynamics and assess R vs L HF.  Stop spiro and potassium with AKI and rising K.  Has been started on solumedrol for acute gout flare. Will continue solumedrol    [name], MD  3:29 PM

## 2018-12-11 NOTE — Progress Notes (Signed)
PT Cancellation Note  Patient Details Name: Raymond Pratt MRN: [id number] DOB: 08-Jan-1951   Cancelled Treatment:    Reason Eval/Treat Not Completed: Medical issues which prohibited therapy per chart review, patient currently running low BP (82/47 and 96/50) this morning; will follow and attempt to see if he becomes medically appropriate and if time/schedule allow.    Deniece Ree PT, DPT, CBIS  Supplemental Physical Therapist [facility]    Pager [phone] Acute Rehab Office [phone]

## 2018-12-11 NOTE — Progress Notes (Addendum)
Advanced Heart Failure Rounding Note  PCP-Cardiologist: [name], MD   Subjective:    Breathing better. CVP 6 this am. Remains on supplemental O2, 1L [place]. Main complaint is hip and knee pain which feels like gout to him. Uric acid elevated at 10.3    Objective:   Weight Range: (!) 147.2 kg Body mass index is 50.82 kg/m.   Vital Signs:   Temp:  [98.2 F (36.8 C)-98.6 F (37 C)] 98.2 F (36.8 C) (10/05 0533) Pulse Rate:  [84-93] 90 (10/05 0641) Resp:  [14-23] 16 (10/04 1949) BP: (84-111)/(36-85) 102/58 (10/05 0641) SpO2:  [94 %-100 %] 97 % (10/05 0641) Last BM Date: 12/10/18  Weight change: Filed Weights   12/08/18 0452 12/09/18 0509 12/10/18 0520  Weight: (!) 149.2 kg (!) 148.8 kg (!) 147.2 kg    Intake/Output:   Intake/Output Summary (Last 24 hours) at 12/11/2018 0827 Last data filed at 12/11/2018 0300 Gross per 24 hour  Intake 832.68 ml  Output 1025 ml  Net -192.32 ml      Physical Exam    General:  Super morbidly obese middle aged WM. No resp difficulty HEENT: Normal Neck: thick neck. Difficult to assess JVD . Carotids 2+ bilat; no bruits. No lymphadenopathy or thyromegaly appreciated. Cor: PMI nondisplaced. Regular rate & rhythm. No rubs, gallops or murmurs. Lungs: Clear TAB Abdomen: obese abdomen, slightly distended, nontender. No hepatosplenomegaly. No bruits or masses. Good bowel sounds. Extremities: tense bilateral LE edema w/ chronic venous stasis dermatitis, some weeping edema Neuro: Alert & orientedx3, cranial nerves grossly intact. moves all 4 extremities w/o difficulty. Affect pleasant   Telemetry   Sinus tach 101 w/ occasional PVCs  EKG    No new EKG to review today  Labs    CBC No results for input(s): WBC, NEUTROABS, HGB, HCT, MCV, PLT in the last 72 hours. Basic Metabolic Panel Recent Labs    12/10/18 0525 12/11/18 0500  NA 131* 132*  K 4.6 5.1  CL 89* 91*  CO2 29 28  GLUCOSE 241* 222*  BUN 33* 46*  CREATININE  1.55* 2.13*  CALCIUM 8.7* 8.9   Liver Function Tests No results for input(s): AST, ALT, ALKPHOS, BILITOT, PROT, ALBUMIN in the last 72 hours. No results for input(s): LIPASE, AMYLASE in the last 72 hours. Cardiac Enzymes No results for input(s): CKTOTAL, CKMB, CKMBINDEX, TROPONINI in the last 72 hours.  BNP: BNP (last 3 results) Recent Labs    12/05/18 1825  BNP 111.3*    ProBNP (last 3 results) No results for input(s): PROBNP in the last 8760 hours.   D-Dimer No results for input(s): DDIMER in the last 72 hours. Hemoglobin A1C No results for input(s): HGBA1C in the last 72 hours. Fasting Lipid Panel No results for input(s): CHOL, HDL, LDLCALC, TRIG, CHOLHDL, LDLDIRECT in the last 72 hours. Thyroid Function Tests No results for input(s): TSH, T4TOTAL, T3FREE, THYROIDAB in the last 72 hours.  Invalid input(s): FREET3  Other results:   Imaging     No results found.   Medications:     Scheduled Medications: . amiodarone  200 mg Oral Daily  . atorvastatin  40 mg Oral Daily  . Chlorhexidine Gluconate Cloth  6 each Topical Daily  . colchicine  0.6 mg Oral BID  . guaiFENesin  600 mg Oral BID  . insulin aspart  0-15 Units Subcutaneous TID WC  . insulin aspart  7 Units Subcutaneous TID WC  . insulin detemir  30 Units Subcutaneous BID  .  methylPREDNISolone (SOLU-MEDROL) injection  60 mg Intravenous Daily  . pantoprazole  40 mg Oral Daily  . polyethylene glycol  17 g Oral Daily  . sacubitril-valsartan  1 tablet Oral BID  . senna-docusate  2 tablet Oral BID  . sodium chloride flush  10-40 mL Intracatheter Q12H  . spironolactone  12.5 mg Oral Daily  . traMADol  50 mg Oral QHS  . Warfarin - Pharmacist Dosing Inpatient   Does not apply q1800     Infusions: . furosemide (LASIX) infusion 20 mg/hr (12/10/18 1828)     PRN Medications:  acetaminophen, guaiFENesin-dextromethorphan, HYDROmorphone (DILAUDID) injection, oxyCODONE, sodium chloride flush    Patient  Profile   Mr.Overcashis a 68 y/o morbidly obese WM,with positive PMH of chronic combined systolic and diastolic CHF/ [id number]) Echo: EF 20-25%, A-fib w/ RVR (2010 s/p failed cardioversion), now on amiodarone, chronic anticoagulation on coumadin w/ INRs followed by PCP, IDDM, HTN and OSAnoncompliant w/ CPAP who was lost to f/u in the [facility] and not seen since 2015, now presenting w/ acute on chronic systolic HF, for which AHF has been consulted, at the request of Dr. Nevada Crane, Internal Medicine.  Assessment/Plan   1. Acute on Chronic Systolic and Diastolic CHF: diagnosed in 2013. LHC 03/06/12 showed normal coronaries. Previous Echo in 2015 w/ EF at 15-20% w/ mod MR and moderately elevated PA pressure 54 mmHg. Presented 12/06/18 w/ 4 week history of progressive bilateral LEE and exertional and resting dyspnea. Volume overloaded on exam w/ 2+ bilateral LEE.BNP only 111 however suspect falsely low given morbid obesity. Chest x-ray demonstrated cardiomegaly with mild pulmonary vascular congestion. Repeat 2D echo 9/30 with persistently low LVEF but slightly improved from prior, now at 25-30% w/ diffuse hypokinesis and moderately elevated RVSP at 50.3 mmHg. - good diuresis w/ Lasix gtt w/ net I/Os -9.7 L total, but diureses is slowing and SCr rising, up from 1.5>>2.13 in 24 hrs . CVP 6.  - Will d/c IV lasix drip today. Reassess in the AM and plan to resume PO diuretics if renal function improves (on torsemide 60 bid PTA) - will hold spironolactone today given AKI -Continue Entresto for now. May need to d/c if renal function does not improve -F/u BMP in the AM. -Continue to hold ? blocker (Toprol XL). Can consider adding back as outpatient.  -Given concomitant T2DM, consider later addition of an SGLT2i (can add as outpatient)  2. PAF: currently NSR. HR in the 90s w/ occasional PVCs -Continue amiodarone 200 mg daily -On coumadin. INR therapeutic today at 2.4 -Continue coumadin dosing per pharmacy   3. OSA: noncompliant w/ CPAP at home - refusing to use while inpatient   4. IDDM: poorly controlled. Hgb A1c 13.3 - getting insulin - management per IM  -Consider addition of an SGLT2i given concomitant systolic HF w/ EF < [id number]  5. AKI: rise in SCr from 1.55>>2.13 in past 24 hrs. UOP has also slowed in past 24 hrs (previously putting out 3+ L/day, only 1.2L out yesterday. BUN up from 33>>46). May be a bit dry from over diuresis. -will hold IV Lasix today - hold spironolactone -f/u BMP in the AM  6. Acute Gout Flare: h/o gout. Complains of bilateral hip and knee pain c/w prior attacks. Likely triggered by aggressive diuresis w/ lasix. Uric acid elevated at 10.  -Continue colchicine (ordered by primary, 3 day course) -given rise in sCr, will need to monitor closely. If creatinine worsens may need to d/c colchicine and try short course of prednisone. To  allopurinol given acute flare.    Length of Stay: [address] [place]  12/11/2018, 8:27 AM  Advanced Heart Failure Team Pager [phone] (M-F; 7a - 4p)  Please contact [facility] for night-coverage after hours (4p -7a ) and weekends on [email or web address]   Agree with above. See my note from earlier today as well .  [name], MD  3:43 PM

## 2018-12-11 NOTE — Progress Notes (Signed)
[place] for Warfarin Indication: atrial fibrillation  Allergies  Allergen Reactions  . Actos [Pioglitazone] Other (See Comments)    Made the patient retain fluid    Patient Measurements: Height: 5\' 7"  (170.2 cm) Weight: (patient stated he hurt too bad to stand.) IBW/kg (Calculated) : 66.1  Vital Signs: Temp: 98.2 F (36.8 C) (10/05 0533) Temp Source: Oral (10/05 0533) BP: 102/58 (10/05 0641) Pulse Rate: 90 (10/05 0641)  Labs: Recent Labs    12/09/18 0439 12/10/18 0525 12/11/18 0500  LABPROT 22.9* 22.6* 25.6*  INR 2.1* 2.0* 2.4*  CREATININE 1.40* 1.55* 2.13*    Estimated Creatinine Clearance: 46.9 mL/min (A) (by C-G formula based on SCr of 2.13 mg/dL (H)).   Assessment: Raymond Pratt presenting with SOB. Pt continues on warfarin for history of afib. INR is at goal @ 2.4. No bleeding noted.   PTA warfarin dose 7.5mg  TThSaSu, 5mg  MWF  Goal of Therapy:  INR 2-3 Monitor platelets by anticoagulation protocol: Yes   Plan:  Warfarin 7.5 mg PO again tonight Daily INR Monitor CBC   Marguerite Olea, [facility] Clinical Pharmacist Phone [phone]  12/11/2018 8:50 AM

## 2018-12-11 NOTE — Progress Notes (Signed)
Paged on call triad NP [place] and notified that pt is crying in pain saying his pain medication is not enough and too far apart.Not due for any medication at the moment. Received one time dose of tramadol. Will continue to monitor.

## 2018-12-11 NOTE — Progress Notes (Signed)
Chaplain rec'd page from nurse [place].  Patient requested prayer.  Chaplain sat with patient, hearing story of his marriage/divorce.  His former wife pursued ministry. Patient was a [employer] in childhood. Now a [employer]. 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  Chaplain rec'd a Code Blue during [place], and wrapped the visit up, including [place], in prayer for patient, who said, 'he was ready to die, but not in as much pain as he once was."  Will refer patient back to unit chaplain, Vaughan Basta, whom patient spoke highly of. Rev. Tamsen Snider Pager [phone]

## 2018-12-11 NOTE — Progress Notes (Addendum)
PROGRESS NOTE  Raymond Pratt [id number] DOB: 1951/01/13 DOA: 12/05/2018 PCP: Shon Baton, MD  HPI/Recap of past 24 hours:  Raymond Pratt is a 68 y.o. male with medical history significant of nonischemic cardiomyopathy with EF of 15-20 on echo on 02/2014, atrial fibrillation on Coumadin, hypertension, type 2 diabetes, OSA on CPAP who presents with concerns of worsening shortness of breath.  Patient reports that he has had gradual worsening of his shortness of breath for the past several weeks.  He was able to lay flat about 3 weeks ago but has had to prop himself up with more pillows due to shortness of breath at night.  Also notes worsening bilateral lower extremity edema.  Has some abdominal bloating as well.  He reports that he has been taking his torsemide daily and was unaware of Lasix which is on his medication list.  He reports that he was also given metolazone but stopped about a month ago since he was only given about "5 pills."  He denies any fever or chest pain.  Denies any abdominal pain, nausea or vomiting.  ED Course: He was afebrile and normotensive on room air.  CBC showed no leukocytosis and had new anemia 10.3 that is decreased from a 13.9 four years ago.  Troponin flat at 19.  BNP of 111.  INR of 2.6.  Chest x-ray showed cardiomegaly with mild pulmonary vascular congestion.  EKG shows sinus rhythm with first-degree AV block with occasional PVCs with prolonged QTc of 482.   Significant volume overload, seen by advanced heart failure team, following.    Noncompliant with CPAP.  12/11/18: Patient seen and examined at his bedside this morning.  He is in severe pain reporting pain mainly in his joints knees and ankles, feel like gout flare.  Elevated uric acid 10.3 on 12/10/2018.  On colchicine, will add IV Solu-Medrol along with pain management.  Later this morning, received a call from bedside RN regarding the patient wanting to change his code status to DNR.   Assessment/Plan: Active Problems:   Type 2 diabetes mellitus with hyperlipidemia (HCC)   OSA on CPAP   Anticoagulated on Coumadin   CHF (congestive heart failure) (HCC)   Acute on chronic systolic CHF with massive volume overload suspect secondary to diet and medication noncompliance. 2D echo done on 12/06/2018 showed LVEF 25 to 30% Net I&O -9.5L Diuretics on hold per cardiology Continue strict I's and O's and daily weight Continue to monitor electrolytes, blood pressure, renal function, daily BMPs  AKI on CKD 3 Baseline creatinine appears to be 1.3 with GFR 54 Creatinine today 2.13 on 12/11/2018 from 1.55 on 12/10/2018 Diuretics on hold Continue to carefully monitor with daily BMPs  Mild hyperkalemia in the setting of AKI Potassium 5.1 from 4.6 Closely monitor Daily BMPs  Acute severe multiple joint pain likely gout flare Uric acid 10.3 on 12/10/2018 Significant bilateral pain in lower extremities affecting multiple joints mainly knees and ankles Continue colchicine 0.6 mg twice daily Started IV steroids Solu-Medrol 60 mg daily Pain management in place with IV Dilaudid as needed for severe pain and oxycodone as needed for moderate pain Continue bowel regimen to avoid opiate-induced constipation  Massive volume overload secondary to acute on chronic systolic CHF Management as stated above  Severe morbid obesity BMI 50 Recommend weight loss outpatient with regular physical activity and healthy dieting  Improving acute hypoxic respiratory failure likely secondary to pulmonary edema from acute on chronic systolic CHF Continue to maintain O2 saturation greater than  92% Obtain home O2 evaluation on 12/11/2018  Improving intractable cough likely multifactorial secondary to acute on chronic systolic CHF versus others Completed pulmonary toilet with Mucinex 1200 mg twice daily and hypersaline nebs  Hypervolemic hyponatremia Sodium improving 2 on 12/11/2018 from 131 from 130  Continue daily BMPs  Type 2 diabetes with hyperglycemia A1c 13.3 on 11/09/2018 Continue Levemir 30 units twice daily Continue NovoLog 7 units 3 times daily and titrate up as needed Continue to monitor CBGs and avoid hypoglycemia  OSA, noncompliant with CPAP Encourage CPAP use  Paroxysmal A. fib on Coumadin In sinus rhythm and rate controlled Toprol held in the setting of volume overload with hypoxia Continue medications as recommended by the advanced heart failure team INR 2.4 on 12/11/2018 Pharmacy managing Coumadin  Hyperlipidemia Continue statin  Medical noncompliance Needs education reinforcement  Iron deficiency anemia Iron studies significant for remarkable iron deficiency next Hemoglobin stable at 10 with MCV 90, hemoglobin 13 in 2016 Iron 18 Trans sat 5 No sign of overt bleeding Transfused IV Feraheme 510 mg once  Goals of care: Patient requested DNR CODE STATUS on 12/11/2018    DVT prophylaxis:WARFARIN Code Status: DNR Family Communication: None at bedside.    Disposition Plan: Patient is currently not appropriate for discharge at this time due to persistent volume overload in the setting of acute on chronic systolic CHF, acute hypoxic respiratory failure, in the setting of multiple comorbidities and advanced age.      Consults called: Cardiology    Objective: Vitals:   12/11/18 0900 12/11/18 1000 12/11/18 1100 12/11/18 1229  BP: (!) 75/25 100/66 (!) 82/47 (!) 96/50  Pulse: 89 100 91 98  Resp:    18  Temp:    (!) 97.5 F (36.4 C)  TempSrc:    Oral  SpO2: 92% 92% 92% 92%  Weight:      Height:        Intake/Output Summary (Last 24 hours) at 12/11/2018 1255 Last data filed at 12/11/2018 0900 Gross per 24 hour  Intake 927.77 ml  Output 750 ml  Net 177.77 ml   Filed Weights   12/08/18 0452 12/09/18 0509 12/10/18 0520  Weight: (!) 149.2 kg (!) 148.8 kg (!) 147.2 kg    Exam:  . General: 68 y.o. year-old male severely obese appears  uncomfortable due to joint pain.  Alert and oriented x3.  Cardiovascular: Regular rate and rhythm no rubs or gallops no JVD or thyromegaly noted. Marland Kitchen Respiratory: Mild rales at bases no wheezing noted. Poor inspiratory effort.   . Abdomen: Obese nontender bowel sounds present.  . Musculoskeletal: Bilateral lower extremity edema.  Tenderness noted with palpation of joints, knees and ankles with some swelling noted. Marland Kitchen Psychiatry: Mood is appropriate for condition and setting.   Data Reviewed: CBC: Recent Labs  Lab 12/05/18 1403 12/05/18 2333 12/06/18 0300  WBC 9.9  --  10.6*  HGB 10.3*  --  10.3*  HCT 35.1* 35.2* 35.8*  MCV 90.0  --  90.2  PLT 388  --  123XX123*   Basic Metabolic Panel: Recent Labs  Lab 12/07/18 1626 12/08/18 0540 12/09/18 0439 12/10/18 0525 12/11/18 0500  NA 141 136 130* 131* 132*  K 4.2 3.7 4.3 4.6 5.1  CL 102 95* 93* 89* 91*  CO2 30 30 29 29 28   GLUCOSE 86 203* 275* 241* 222*  BUN 23 21 25* 33* 46*  CREATININE 1.31* 1.35* 1.40* 1.55* 2.13*  CALCIUM 9.0 8.4* 8.2* 8.7* 8.9   GFR: Estimated Creatinine  Clearance: 46.9 mL/min (A) (by C-G formula based on SCr of 2.13 mg/dL (H)). Liver Function Tests: Recent Labs  Lab 12/05/18 1825  AST 23  ALT 28  ALKPHOS 69  BILITOT 0.7  PROT 6.9  ALBUMIN 3.1*   No results for input(s): LIPASE, AMYLASE in the last 168 hours. No results for input(s): AMMONIA in the last 168 hours. Coagulation Profile: Recent Labs  Lab 12/07/18 0812 12/08/18 0540 12/09/18 0439 12/10/18 0525 12/11/18 0500  INR 2.0* 1.9* 2.1* 2.0* 2.4*   Cardiac Enzymes: No results for input(s): CKTOTAL, CKMB, CKMBINDEX, TROPONINI in the last 168 hours. BNP (last 3 results) No results for input(s): PROBNP in the last 8760 hours. HbA1C: No results for input(s): HGBA1C in the last 72 hours. CBG: Recent Labs  Lab 12/10/18 1127 12/10/18 1634 12/10/18 2101 12/11/18 0517 12/11/18 1224  GLUCAP 177* 174* 193* 156* 153*   Lipid Profile: No  results for input(s): CHOL, HDL, LDLCALC, TRIG, CHOLHDL, LDLDIRECT in the last 72 hours. Thyroid Function Tests: No results for input(s): TSH, T4TOTAL, FREET4, T3FREE, THYROIDAB in the last 72 hours. Anemia Panel: No results for input(s): VITAMINB12, FOLATE, FERRITIN, TIBC, IRON, RETICCTPCT in the last 72 hours. Urine analysis:    Component Value Date/Time   COLORURINE YELLOW 01/04/2014 [place] 01/04/2014 0955   LABSPEC 1.014 01/04/2014 0955   PHURINE 7.5 01/04/2014 [place] 01/04/2014 0955   HGBUR NEGATIVE 01/04/2014 0955   HGBUR trace-lysed 08/04/2008 [place] 01/04/2014 [place] 01/04/2014 0955   PROTEINUR NEGATIVE 01/04/2014 0955   UROBILINOGEN 1.0 01/04/2014 0955   NITRITE NEGATIVE 01/04/2014 0955   LEUKOCYTESUR NEGATIVE 01/04/2014 0955   Sepsis Labs: @LABRCNTIP (procalcitonin:4,lacticidven:4)  ) Recent Results (from the past 240 hour(s))  SARS CORONAVIRUS 2 (TAT 6-24 HRS) Nasopharyngeal Nasopharyngeal Swab     Status: None   Collection Time: 12/05/18  6:25 PM   Specimen: Nasopharyngeal Swab  Result Value Ref Range Status   SARS Coronavirus 2 NEGATIVE NEGATIVE Final    Comment: (NOTE) SARS-CoV-2 target nucleic acids are NOT DETECTED. The SARS-CoV-2 RNA is generally detectable in upper and lower respiratory specimens during the acute phase of infection. Negative results do not preclude SARS-CoV-2 infection, do not rule out co-infections with other pathogens, and should not be used as the sole basis for treatment or other patient management decisions. Negative results must be combined with clinical observations, patient history, and epidemiological information. The expected result is Negative. Fact Sheet for Patients: SugarRoll.be Fact Sheet for Healthcare Providers: https://www.woods-mathews.com/ This test is not yet approved or cleared by the [place]  FDA and  has been authorized for detection and/or diagnosis of SARS-CoV-2 by FDA under an Emergency Use Authorization (EUA). This EUA will remain  in effect (meaning this test can be used) for the duration of the COVID-19 declaration under Section 56 4(b)(1) of the Act, 21 U.S.C. section 360bbb-3(b)(1), unless the authorization is terminated or revoked sooner. Performed at [facility], [place] [address] [place], [place] [address]       Studies: No results found.  Scheduled Meds: . amiodarone  200 mg Oral Daily  . atorvastatin  40 mg Oral Daily  . Chlorhexidine Gluconate Cloth  6 each Topical Daily  . guaiFENesin  600 mg Oral BID  . insulin aspart  0-15 Units Subcutaneous TID WC  . insulin aspart  7 Units Subcutaneous TID WC  . insulin detemir  30 Units Subcutaneous BID  .  methylPREDNISolone (SOLU-MEDROL) injection  60 mg Intravenous Daily  . pantoprazole  40 mg Oral Daily  . polyethylene glycol  17 g Oral Daily  . sacubitril-valsartan  1 tablet Oral BID  . senna-docusate  2 tablet Oral BID  . sodium chloride flush  10-40 mL Intracatheter Q12H  . traMADol  50 mg Oral QHS  . warfarin  7.5 mg Oral ONCE-1800  . Warfarin - Pharmacist Dosing Inpatient   Does not apply q1800    Continuous Infusions:    LOS: 6 days     Kayleen Memos, MD [facility] Pager [phone]  If 7PM-7AM, please contact night-coverage [email or web address] Password [email or web address] 12/11/2018, 12:55 PM

## 2018-12-11 NOTE — Progress Notes (Signed)
Pt in chair in am upon assessment. Pt c/o pain 10/10 numerical scale in hips and LE. Pt stated that night shift RN had given pain med. See mar. bp taken and noted see flowsheet. Pt educated the importance of being in bed to be weighted and because of low bp. Using the gait belt and pt using walker, bed moved as close to pt as possible pt pivoted to bed. Pt total assist. External catheter placed on pt. Will continue to monitor.

## 2018-12-11 NOTE — Progress Notes (Signed)
Responded to unit call for [place] for Mr. Meader.  With some difficulty due to his weakness he was able to complete the form and it was notarized.  Initiated a ministry of presence, care and support.  Prayed with Mr. Colavito at bedside several times throughout the time it took him to complete the form.  He told me a joke!  My plan is to visit with Mr. Pennick again.  Please page Chaplain if needed.  De Burrs Chaplain Resident Pager?  [phone]

## 2018-12-12 ENCOUNTER — Encounter (HOSPITAL_COMMUNITY): Payer: Self-pay | Admitting: Internal Medicine

## 2018-12-12 ENCOUNTER — Encounter (HOSPITAL_COMMUNITY)
Admission: EM | Disposition: A | Discharge status: Home / Self Care | Payer: Self-pay | Source: Home / Self Care | Attending: Internal Medicine

## 2018-12-12 DIAGNOSIS — N179 Acute kidney failure, unspecified: Secondary | ICD-10-CM

## 2018-12-12 DIAGNOSIS — I480 Paroxysmal atrial fibrillation: Secondary | ICD-10-CM

## 2018-12-12 HISTORY — PX: RIGHT HEART CATH: CATH118263

## 2018-12-12 LAB — PROTIME-INR
INR: 2.7 — ABNORMAL HIGH (ref 0.8–1.2)
Prothrombin Time: 28.2 seconds — ABNORMAL HIGH (ref 11.4–15.2)

## 2018-12-12 LAB — POCT I-STAT EG7
Acid-Base Excess: 3 mmol/L — ABNORMAL HIGH (ref 0.0–2.0)
Acid-Base Excess: 3 mmol/L — ABNORMAL HIGH (ref 0.0–2.0)
Bicarbonate: 29.7 mmol/L — ABNORMAL HIGH (ref 20.0–28.0)
Bicarbonate: 30.3 mmol/L — ABNORMAL HIGH (ref 20.0–28.0)
Calcium, Ion: 1.19 mmol/L (ref 1.15–1.40)
Calcium, Ion: 1.18 mmol/L (ref 1.15–1.40)
HCT: 35 % — ABNORMAL LOW (ref 39.0–52.0)
HCT: 36 % — ABNORMAL LOW (ref 39.0–52.0)
Hemoglobin: 11.9 g/dL — ABNORMAL LOW (ref 13.0–17.0)
Hemoglobin: 12.2 g/dL — ABNORMAL LOW (ref 13.0–17.0)
O2 Saturation: 64 %
O2 Saturation: 65 %
Potassium: 4.7 mmol/L (ref 3.5–5.1)
Potassium: 4.8 mmol/L (ref 3.5–5.1)
Sodium: 136 mmol/L (ref 135–145)
Sodium: 135 mmol/L (ref 135–145)
TCO2: 31 mmol/L (ref 22–32)
TCO2: 32 mmol/L (ref 22–32)
pCO2, Ven: 56.9 mmHg (ref 44.0–60.0)
pCO2, Ven: 57.6 mmHg (ref 44.0–60.0)
pH, Ven: 7.325 (ref 7.250–7.430)
pH, Ven: 7.33 (ref 7.250–7.430)
pO2, Ven: 37 mmHg (ref 32.0–45.0)
pO2, Ven: 37 mmHg (ref 32.0–45.0)

## 2018-12-12 LAB — GLUCOSE, CAPILLARY
Glucose-Capillary: 293 mg/dL — ABNORMAL HIGH (ref 70–99)
Glucose-Capillary: 176 mg/dL — ABNORMAL HIGH (ref 70–99)
Glucose-Capillary: 281 mg/dL — ABNORMAL HIGH (ref 70–99)
Glucose-Capillary: 298 mg/dL — ABNORMAL HIGH (ref 70–99)
Glucose-Capillary: 382 mg/dL — ABNORMAL HIGH (ref 70–99)

## 2018-12-12 LAB — BASIC METABOLIC PANEL
Anion gap: 10 (ref 5–15)
BUN: 72 mg/dL — ABNORMAL HIGH (ref 8–23)
CO2: 27 mmol/L (ref 22–32)
Calcium: 8.5 mg/dL — ABNORMAL LOW (ref 8.9–10.3)
Chloride: 95 mmol/L — ABNORMAL LOW (ref 98–111)
Creatinine, Ser: 2.27 mg/dL — ABNORMAL HIGH (ref 0.61–1.24)
GFR calc Af Amer: 33 mL/min — ABNORMAL LOW (ref >60)
GFR calc non Af Amer: 29 mL/min — ABNORMAL LOW (ref >60)
Glucose, Bld: 321 mg/dL — ABNORMAL HIGH (ref 70–99)
Potassium: 4.8 mmol/L (ref 3.5–5.1)
Sodium: 132 mmol/L — ABNORMAL LOW (ref 135–145)

## 2018-12-12 LAB — COOXEMETRY PANEL
Carboxyhemoglobin: 1.4 % (ref 0.5–1.5)
Methemoglobin: 1 % (ref 0.0–1.5)
O2 Saturation: 57.6 %
Total hemoglobin: 11.6 g/dL — ABNORMAL LOW (ref 12.0–16.0)

## 2018-12-12 SURGERY — RIGHT HEART CATH
Anesthesia: LOCAL

## 2018-12-12 MED ORDER — SODIUM CHLORIDE 0.9% FLUSH: 3.0000 mL | Freq: Two times a day (BID) | INTRAVENOUS | Status: DC

## 2018-12-12 MED ORDER — HYDRALAZINE HCL 20 MG/ML IJ SOLN: 10.0000 mg | INTRAMUSCULAR | Status: AC | PRN

## 2018-12-12 MED ORDER — LIDOCAINE HCL (PF) 1 % IJ SOLN
INTRAMUSCULAR | Status: DC | PRN
  Administered 2018-12-12: 15 mL via INTRADERMAL
  Administered 2018-12-12: 10 mL via INTRADERMAL
  Administered 2018-12-12: 2 mL via INTRADERMAL

## 2018-12-12 MED ORDER — ACETAMINOPHEN 325 MG PO TABS
650.0000 mg | ORAL_TABLET | ORAL | Status: DC | PRN
  Administered 2018-12-13: 650 mg via ORAL
  Filled 2018-12-12: qty 2

## 2018-12-12 MED ORDER — WARFARIN SODIUM 5 MG PO TABS
5.0000 mg | ORAL_TABLET | ORAL | Status: DC
  Administered 2018-12-12: 18:00:00 5 mg via ORAL
  Filled 2018-12-12: qty 1

## 2018-12-12 MED ORDER — LABETALOL HCL 5 MG/ML IV SOLN: 10.0000 mg | INTRAVENOUS | Status: AC | PRN

## 2018-12-12 MED ORDER — WARFARIN SODIUM 7.5 MG PO TABS: 7.5000 mg | ORAL_TABLET | ORAL | Status: DC

## 2018-12-12 MED ORDER — SODIUM CHLORIDE 0.9 % IV SOLN: 250.0000 mL | INTRAVENOUS | Status: DC | PRN

## 2018-12-12 MED ORDER — HEPARIN (PORCINE) IN NACL 1000-0.9 UT/500ML-% IV SOLN
INTRAVENOUS | Status: DC | PRN
  Administered 2018-12-12: 500 mL

## 2018-12-12 MED ORDER — INSULIN ASPART 100 UNIT/ML ~~LOC~~ SOLN
5.0000 [IU] | Freq: Once | SUBCUTANEOUS | Status: AC
  Administered 2018-12-13: 5 [IU] via SUBCUTANEOUS

## 2018-12-12 MED ORDER — LIDOCAINE HCL (PF) 1 % IJ SOLN
INTRAMUSCULAR | Status: AC
  Filled 2018-12-12: qty 30

## 2018-12-12 MED ORDER — HEPARIN (PORCINE) IN NACL 1000-0.9 UT/500ML-% IV SOLN
INTRAVENOUS | Status: AC
  Filled 2018-12-12: qty 500

## 2018-12-12 MED ORDER — ONDANSETRON HCL 4 MG/2ML IJ SOLN: 4.0000 mg | Freq: Four times a day (QID) | INTRAMUSCULAR | Status: DC | PRN

## 2018-12-12 MED ORDER — SODIUM CHLORIDE 0.9% FLUSH: 3.0000 mL | INTRAVENOUS | Status: DC | PRN

## 2018-12-12 SURGICAL SUPPLY — 12 items
CATH SWAN GANZ 7F STRAIGHT (CATHETERS) ×1 IMPLANT
HOVERMATT SINGLE USE (MISCELLANEOUS) ×1 IMPLANT
KIT MICROPUNCTURE NIT STIFF (SHEATH) ×2 IMPLANT
PACK CARDIAC CATHETERIZATION (CUSTOM PROCEDURE TRAY) ×2 IMPLANT
SHEATH GLIDE SLENDER 4/5FR (SHEATH) ×1 IMPLANT
SHEATH PINNACLE 7F 10CM (SHEATH) ×1 IMPLANT
SHEATH PROBE COVER 6X72 (BAG) ×1 IMPLANT
TRANSDUCER W/STOPCOCK (MISCELLANEOUS) ×2 IMPLANT
TUBING ART PRESS 72  MALE/FEM (TUBING) ×1
TUBING ART PRESS 72 MALE/FEM (TUBING) IMPLANT
WIRE EMERALD 3MM-J .035X150CM (WIRE) ×1 IMPLANT
WIRE MICROINTRODUCER 60CM (WIRE) ×1 IMPLANT

## 2018-12-12 NOTE — Progress Notes (Signed)
Attempted follow-up visit and continuing spiritual care of Raymond Pratt.  He had a procedure earlier and was too sleepy to visit.  Will attempt a re-visit.  De Burrs Chaplain Resident Pager:  [phone]

## 2018-12-12 NOTE — Progress Notes (Addendum)
PROGRESS NOTE  Raymond Pratt [id number] DOB: 1950-10-13 DOA: 12/05/2018 PCP: Shon Baton, MD  HPI/Recap of past 24 hours:  Raymond Pratt is a 68 y.o. male with medical history significant of nonischemic cardiomyopathy with EF of 15-20 on echo on 02/2014, atrial fibrillation on Coumadin, hypertension, type 2 diabetes, OSA on CPAP who presents with concerns of worsening shortness of breath.  Patient reports that he has had gradual worsening of his shortness of breath for the past several weeks.  He was able to lay flat about 3 weeks ago but has had to prop himself up with more pillows due to shortness of breath at night.  Also notes worsening bilateral lower extremity edema.  Has some abdominal bloating as well.  He reports that he has been taking his torsemide daily and was unaware of Lasix which is on his medication list.  He reports that he was also given metolazone but stopped about a month ago since he was only given about "5 pills."  He denies any fever or chest pain.  Denies any abdominal pain, nausea or vomiting.  ED Course: He was afebrile and normotensive on room air.  CBC showed no leukocytosis and had new anemia 10.3 that is decreased from a 13.9 four years ago.  Troponin flat at 19.  BNP of 111.  INR of 2.6.  Chest x-ray showed cardiomegaly with mild pulmonary vascular congestion.  EKG shows sinus rhythm with first-degree AV block with occasional PVCs with prolonged QTc of 482.   Significant volume overload, seen by advanced heart failure team, following.    Noncompliant with CPAP.  12/11/18: Patient seen and examined at his bedside this morning.  He is in severe pain reporting pain mainly in his joints knees and ankles, feel like gout flare.  Elevated uric acid 10.3 on 12/10/2018.  On colchicine, will add IV Solu-Medrol along with pain management.  Later this morning, received a call from bedside RN regarding the patient wanting to change his code status to DNR.  12/12/18:  Patient was seen and examined at his bedside after heart cath this afternoon. Denies chest pain or dyspnea at rest. States B/L LE joint pain improved but still present.  Assessment/Plan: Active Problems:   Type 2 diabetes mellitus with hyperlipidemia (HCC)   OSA on CPAP   Anticoagulated on Coumadin   CHF (congestive heart failure) (HCC)   Acute on chronic systolic CHF with massive volume overload suspect secondary to diet and medication noncompliance. 2D echo done on 12/06/2018 showed LVEF 25 to 30% Net I&O -11.8 L Diuretics on hold per cardiology Continue strict I's and O's and daily weight Continue to monitor electrolytes, blood pressure, renal function, daily BMPs Post heart cath on 12/12/2018  Worsening AKI on CKD 3 Baseline creatinine appears to be 1.3 with GFR 54 Creatinine 2.27 with GFR of 29 on 12/12/2018 from 2.13 on 12/11/2018 from 1.55 on 12/10/2018 Continue to avoid nephrotoxins Diuretics on hold Continue daily BMPs  Mild hyperkalemia in the setting of AKI Potassium 5.1 from 4.6 Potassium improved to 4.7 on 12/12/2018. Continue daily BMPs  Acute severe multiple joint pain likely gout flare Uric acid 10.3 on 12/10/2018 Significant bilateral pain in lower extremities affecting multiple joints mainly knees and ankles Continue colchicine 0.6 mg twice daily Continue IV steroids Solu-Medrol 60 mg daily x5 days, day #2 of 5 Pain management in place with IV Dilaudid as needed for severe pain and oxycodone as needed for moderate pain Continue bowel regimen to avoid opiate-induced constipation  Transient hypotension on Entresto Map of 57 earlier this afternoon Maintain map greater than 65 if possible Avoid IV fluid in the setting of volume overload Currently on Entresto, defer to cardiology to address parameters. Continue to closely monitor vital signs  Massive volume overload secondary to acute on chronic systolic CHF Management as stated above  Severe morbid obesity BMI  50 Recommend weight loss outpatient with regular physical activity and healthy dieting  Improving acute hypoxic respiratory failure likely secondary to pulmonary edema from acute on chronic systolic CHF Continue to maintain O2 saturation greater than 92% Obtain home O2 evaluation on 12/11/2018  Improving intractable cough likely multifactorial secondary to acute on chronic systolic CHF versus others Completed pulmonary toilet with Mucinex 1200 mg twice daily and hypersaline nebs  Type 2 diabetes with hyperglycemia A1c 13.3 on 11/09/2018 Continue Levemir 30 units twice daily Continue NovoLog 7 units 3 times daily and titrate up as needed Continue to monitor CBGs and avoid hypoglycemia  OSA, noncompliant with CPAP Encourage CPAP use  Paroxysmal A. fib on Coumadin In sinus rhythm and rate controlled Toprol held in the setting of volume overload with hypoxia Continue medications as recommended by the advanced heart failure team INR 2.7 on 12/12/2018. Pharmacy managing Coumadin  Hyperlipidemia Continue statin  Medical noncompliance Needs education/reinforcement  Iron deficiency anemia Iron studies significant for remarkable iron deficiency next Hemoglobin stable at 10 with MCV 90, hemoglobin 13 in 2016 Iron 18 Trans sat 5 No sign of overt bleeding Transfused IV Feraheme 510 mg once Hemoglobin is stable 11.9 on 12/12/2018  Goals of care: Patient requested DNR CODE STATUS on 12/11/2018    DVT prophylaxis:WARFARIN Code Status: DNR Family Communication: None at bedside.    Disposition Plan: Patient is currently not appropriate for discharge at this time due to persistent volume overload in the setting of acute on chronic systolic CHF, acute hypoxic respiratory failure, in the setting of multiple comorbidities and advanced age.      Consults called: Cardiology    Objective: Vitals:   12/12/18 0931 12/12/18 1000 12/12/18 1100 12/12/18 1200  BP: (!) 103/54 106/65 99/82 (!)  107/48  Pulse:   78   Resp: 15 (!) 24 19 (!) 22  Temp: 98.4 F (36.9 C)     TempSrc: Oral     SpO2:   98%   Weight:      Height:        Intake/Output Summary (Last 24 hours) at 12/12/2018 1555 Last data filed at 12/12/2018 [id number] Gross per 24 hour  Intake 255.98 ml  Output 1775 ml  Net -1519.02 ml   Filed Weights   12/09/18 0509 12/10/18 0520 12/12/18 0253  Weight: (!) 148.8 kg (!) 147.2 kg (!) 147.6 kg    Exam:  . General: 68 y.o. year-old male severely obese in no acute distress.  Alert and interactive. . Cardiovascular: Regular rate and rhythm no rubs or gallops noted.  Thyromegaly noted. Marland Kitchen Respiratory: Mild rales at bases no wheezing noted.  Poor inspiratory effort. . Abdomen: Obese nontender normal bowel sounds present . Musculoskeletal: Bilateral lower extremity edema.   Marland Kitchen Psychiatry: Mood is appropriate for condition and setting.   Data Reviewed: CBC: Recent Labs  Lab 12/05/18 2333 12/06/18 0300 12/12/18 0838  WBC  --  10.6*  --   HGB  --  10.3* 11.9*  HCT 35.2* 35.8* 35.0*  MCV  --  90.2  --   PLT  --  408*  --    Basic Metabolic  Panel: Recent Labs  Lab 12/08/18 0540 12/09/18 0439 12/10/18 0525 12/11/18 0500 12/12/18 0354 12/12/18 0838  NA 136 130* 131* 132* 132* 136  K 3.7 4.3 4.6 5.1 4.8 4.7  CL 95* 93* 89* 91* 95*  --   CO2 30 29 29 28 27   --   GLUCOSE 203* 275* 241* 222* 321*  --   BUN 21 25* 33* 46* 72*  --   CREATININE 1.35* 1.40* 1.55* 2.13* 2.27*  --   CALCIUM 8.4* 8.2* 8.7* 8.9 8.5*  --    GFR: Estimated Creatinine Clearance: 44.1 mL/min (A) (by C-G formula based on SCr of 2.27 mg/dL (H)). Liver Function Tests: Recent Labs  Lab 12/05/18 1825  AST 23  ALT 28  ALKPHOS 69  BILITOT 0.7  PROT 6.9  ALBUMIN 3.1*   No results for input(s): LIPASE, AMYLASE in the last 168 hours. No results for input(s): AMMONIA in the last 168 hours. Coagulation Profile: Recent Labs  Lab 12/08/18 0540 12/09/18 0439 12/10/18 0525 12/11/18 0500  12/12/18 0354  INR 1.9* 2.1* 2.0* 2.4* 2.7*   Cardiac Enzymes: No results for input(s): CKTOTAL, CKMB, CKMBINDEX, TROPONINI in the last 168 hours. BNP (last 3 results) No results for input(s): PROBNP in the last 8760 hours. HbA1C: No results for input(s): HGBA1C in the last 72 hours. CBG: Recent Labs  Lab 12/11/18 1224 12/11/18 1646 12/11/18 2119 12/12/18 0540 12/12/18 1102  GLUCAP 153* 228* 304* 293* 176*   Lipid Profile: No results for input(s): CHOL, HDL, LDLCALC, TRIG, CHOLHDL, LDLDIRECT in the last 72 hours. Thyroid Function Tests: No results for input(s): TSH, T4TOTAL, FREET4, T3FREE, THYROIDAB in the last 72 hours. Anemia Panel: No results for input(s): VITAMINB12, FOLATE, FERRITIN, TIBC, IRON, RETICCTPCT in the last 72 hours. Urine analysis:    Component Value Date/Time   COLORURINE YELLOW 01/04/2014 [place] 01/04/2014 0955   LABSPEC 1.014 01/04/2014 0955   PHURINE 7.5 01/04/2014 [place] 01/04/2014 0955   HGBUR NEGATIVE 01/04/2014 0955   HGBUR trace-lysed 08/04/2008 [place] 01/04/2014 [place] 01/04/2014 0955   PROTEINUR NEGATIVE 01/04/2014 0955   UROBILINOGEN 1.0 01/04/2014 0955   NITRITE NEGATIVE 01/04/2014 0955   LEUKOCYTESUR NEGATIVE 01/04/2014 0955   Sepsis Labs: @LABRCNTIP (procalcitonin:4,lacticidven:4)  ) Recent Results (from the past 240 hour(s))  SARS CORONAVIRUS 2 (TAT 6-24 HRS) Nasopharyngeal Nasopharyngeal Swab     Status: None   Collection Time: 12/05/18  6:25 PM   Specimen: Nasopharyngeal Swab  Result Value Ref Range Status   SARS Coronavirus 2 NEGATIVE NEGATIVE Final    Comment: (NOTE) SARS-CoV-2 target nucleic acids are NOT DETECTED. The SARS-CoV-2 RNA is generally detectable in upper and lower respiratory specimens during the acute phase of infection. Negative results do not preclude SARS-CoV-2 infection, do not rule out co-infections with other pathogens, and  should not be used as the sole basis for treatment or other patient management decisions. Negative results must be combined with clinical observations, patient history, and epidemiological information. The expected result is Negative. Fact Sheet for Patients: SugarRoll.be Fact Sheet for Healthcare Providers: https://www.woods-mathews.com/ This test is not yet approved or cleared by the [place] FDA and  has been authorized for detection and/or diagnosis of SARS-CoV-2 by FDA under an Emergency Use Authorization (EUA). This EUA will remain  in effect (meaning this test can be used) for the duration of the COVID-19 declaration under Section 56 4(b)(1) of the Act, 21 U.S.C. section 360bbb-3(b)(1),  unless the authorization is terminated or revoked sooner. Performed at [facility], [place] [address] [place], [place] [address]       Studies: No results found.  Scheduled Meds: . amiodarone  200 mg Oral Daily  . atorvastatin  40 mg Oral Daily  . Chlorhexidine Gluconate Cloth  6 each Topical Daily  . guaiFENesin  600 mg Oral BID  . insulin aspart  0-15 Units Subcutaneous TID WC  . insulin aspart  7 Units Subcutaneous TID WC  . insulin detemir  30 Units Subcutaneous BID  . methylPREDNISolone (SOLU-MEDROL) injection  60 mg Intravenous Daily  . pantoprazole  40 mg Oral Daily  . polyethylene glycol  17 g Oral Daily  . sacubitril-valsartan  1 tablet Oral BID  . senna-docusate  2 tablet Oral BID  . sodium chloride flush  10-40 mL Intracatheter Q12H  . sodium chloride flush  3 mL Intravenous Q12H  . traMADol  50 mg Oral QHS  . warfarin  5 mg Oral Once per day on Sun Tue Thu Sat  . [START ON 12/13/2018] warfarin  7.5 mg Oral Once per day on Mon Wed Fri  . Warfarin - Pharmacist Dosing Inpatient   Does not apply q1800    Continuous Infusions: . sodium chloride       LOS: 7 days     Kayleen Memos, MD [facility] Pager  [phone]  If 7PM-7AM, please contact night-coverage [email or web address] Password [email or web address] 12/12/2018, 3:55 PM

## 2018-12-12 NOTE — Progress Notes (Signed)
PT Cancellation Note  Patient Details Name: Raymond Pratt MRN: [id number] DOB: [birth year]   Cancelled Treatment:    Reason Eval/Treat Not Completed: Patient at procedure or test/unavailable Pt off floor at cath lab. Will follow up as time allows.   [name] A [name] 12/12/2018, 8:00 AM Wray Kearns, PT, DPT Acute Rehabilitation Services Pager [phone] Office [phone]

## 2018-12-12 NOTE — Evaluation (Signed)
Physical Therapy Evaluation Patient Details Name: Raymond Pratt MRN: [id number] DOB: 03/08/1951 Today's Date: 12/12/2018   History of Present Illness  Patient is a 68 y/o male who presents with SOB and edema BLEs. EKG- 1st degree heart block. Admitted with acute on chronic CHF and gout flare up since admission. s/p cardiac cath 10/6.  PMH includes NICM, A-fib, HTN, DM, CAD.  Clinical Impression  Patient presents with pain, generalized weakness/debility, decreased activity tolerance, decreased endurance and impaired mobility s/p above. Pt independent PTA and lives alone. Pt now requires total A of 2 to stand from elevated bed height with lots of difficulty. Sp02 ranged from 87-92% on 2L/min 02. Pt tends to hold his breath with transitions. Also reports being in a saddened mood about health and current debility. Would benefit from SNF to maximize independence and mobility prior to return home. Will follow acutely.    Follow Up Recommendations SNF;Supervision for mobility/OOB;Supervision/Assistance - 24 hour    Equipment Recommendations  Rolling walker with 5" wheels    Recommendations for Other Services       Precautions / Restrictions Precautions Precautions: Fall Precaution Comments: watch 02, gout flare up BLEs Restrictions Weight Bearing Restrictions: No      Mobility  Bed Mobility Overal bed mobility: Needs Assistance Bed Mobility: Supine to Sit;Sit to Supine     Supine to sit: Supervision;HOB elevated Sit to supine: Supervision   General bed mobility comments: Able to pull self up into long sitting with heavy use of rails and able to get to EOB with supervision and use of rails.  Transfers Overall transfer level: Needs assistance Equipment used: Rolling walker (2 wheeled) Transfers: Sit to/from Stand Sit to Stand: Total assist;+2 physical assistance;From elevated surface         General transfer comment: Attempted to stand from low bed height but unable to clear  bottom despite total A of 2; stood from elevated bed height with use of momentum and RW as well as cues for hand placement. Cues for upright posture. very weak.  Ambulation/Gait             General Gait Details: Unable  Stairs            Wheelchair Mobility    Modified Rankin (Stroke Patients Only)       Balance Overall balance assessment: Needs assistance Sitting-balance support: Feet supported;No upper extremity supported Sitting balance-Leahy Scale: Good Sitting balance - Comments: Able to reach down to shins without LOB, practiced scooting along side bed with Mod A and cues for technique.   Standing balance support: During functional activity Standing balance-Leahy Scale: Poor Standing balance comment: External support provided and BUEs on RW. Flexed trunk.                             Pertinent Vitals/Pain Pain Assessment: 0-10 Pain Score: 9  Pain Location: right arm; BLEs with standing Pain Descriptors / Indicators: Sore Pain Intervention(s): Repositioned;Monitored during session;Limited activity within patient's tolerance    Home Living Family/patient expects to be discharged to:: Private residence Living Arrangements: Alone Available Help at Discharge: Family;Available PRN/intermittently Type of Home: House Home Access: Stairs to enter Entrance Stairs-Rails: None Entrance Stairs-Number of Steps: 1 Home Layout: Two level;Able to live on main level with bedroom/bathroom Home Equipment: Kasandra Knudsen - single point      Prior Function Level of Independence: Independent         Comments: Drove himself to the hospital.  Hand Dominance   Dominant Hand: Right    Extremity/Trunk Assessment   Upper Extremity Assessment Upper Extremity Assessment: Defer to OT evaluation    Lower Extremity Assessment Lower Extremity Assessment: Generalized weakness;RLE deficits/detail;LLE deficits/detail(Grossly ~2+/5 throughout) RLE Sensation: decreased  light touch LLE Sensation: decreased light touch       Communication   Communication: No difficulties  Cognition Arousal/Alertness: Awake/alert Behavior During Therapy: WFL for tasks assessed/performed Overall Cognitive Status: Within Functional Limits for tasks assessed                                 General Comments: Seems in a depressed mood about his health and lack of progress/weakness.      General Comments General comments (skin integrity, edema, etc.): Sp02 >87% on 2L.min 02,. Pt with tendency to hold breath during transitions. Pre activity BP 107/48, post activity BP 101/70. Asymptomatic.    Exercises     Assessment/Plan    PT Assessment Patient needs continued PT services  PT Problem List Decreased strength;Decreased mobility;Decreased balance;Pain;Impaired sensation;Decreased activity tolerance;Cardiopulmonary status limiting activity       PT Treatment Interventions Therapeutic activities;Gait training;Therapeutic exercise;Patient/family education;Balance training;Functional mobility training    PT Goals (Current goals can be found in the Care Plan section)  Acute Rehab PT Goals Patient Stated Goal: to get stronger and be able to walk PT Goal Formulation: With patient Time For Goal Achievement: 12/26/18 Potential to Achieve Goals: Fair    Frequency Min 2X/week   Barriers to discharge Decreased caregiver support      Co-evaluation PT/OT/SLP Co-Evaluation/Treatment: Yes Reason for Co-Treatment: Complexity of the patient's impairments (multi-system involvement);For patient/therapist safety;To address functional/ADL transfers PT goals addressed during session: Mobility/safety with mobility;Strengthening/ROM         AM-PAC PT "6 Clicks" Mobility  Outcome Measure Help needed turning from your back to your side while in a flat bed without using bedrails?: A Lot Help needed moving from lying on your back to sitting on the side of a flat bed  without using bedrails?: A Lot Help needed moving to and from a bed to a chair (including a wheelchair)?: Total Help needed standing up from a chair using your arms (e.g., wheelchair or bedside chair)?: Total Help needed to walk in hospital room?: Total Help needed climbing 3-5 steps with a railing? : Total 6 Click Score: 8    End of Session Equipment Utilized During Treatment: Oxygen Activity Tolerance: Patient limited by pain Patient left: in bed;with call bell/phone within reach;with bed alarm set Nurse Communication: Mobility status;Need for lift equipment PT Visit Diagnosis: Pain Pain - Right/Left: Right(bil legs) Pain - part of body: Arm;Leg    Time: [id number] PT Time Calculation (min) (ACUTE ONLY): 34 min   Charges:   PT Evaluation $PT Eval Moderate Complexity: 1 Mod          Wray Kearns, PT, DPT Acute Rehabilitation Services Pager [phone] Office [place] 12/12/2018, 1:16 PM

## 2018-12-12 NOTE — Progress Notes (Signed)
Inpatient Diabetes Program Recommendations  AACE/ADA: New Consensus Statement on Inpatient Glycemic Control   Target Ranges:  Prepandial:   less than 140 mg/dL      Peak postprandial:   less than 180 mg/dL (1-2 hours)      Critically ill patients:  140 - 180 mg/dL  Results for CEM, COLUCCI (MRN [id number]) as of 12/12/2018 11:40  Ref. Range 12/12/2018 11:02  Glucose-Capillary Latest Ref Range: 70 - 99 mg/dL 176 (H)   Results for AAMER, BARRICKLOW (MRN [id number]) as of 12/12/2018 11:40  Ref. Range 12/12/2018 03:54  Glucose Latest Ref Range: 70 - 99 mg/dL 321 (H)   Results for SHAQUEAL, BORDES (MRN [id number]) as of 12/12/2018 11:40  Ref. Range 12/11/2018 05:17 12/11/2018 12:24 12/11/2018 16:46 12/11/2018 21:19  Glucose-Capillary Latest Ref Range: 70 - 99 mg/dL 156 (H) 153 (H) 228 (H) 304 (H)   Review of Glycemic Control  Diabetes history: DM2 Outpatient Diabetes medications: 70/30 65 units QAM, Regular 20 units with lunch and supper Current orders for Inpatient glycemic control: Levemir 30 units BID, Novolog 0-15 units TID with meals, Novolog 7 units TID with meals for meal coverage; Solumedrol 60 mg daily  Inpatient Diabetes Program Recommendations:   Insulin-Basal: If steroids are continued, please consider increasing Levemir to 33 units BID.  Insulin-Correction: Please consider ordering Novolog 0-5 units QHS for bedtime correction.  Thanks, Barnie Alderman, RN, MSN, CDE Diabetes Coordinator Inpatient Diabetes Program [phone] (Team Pager from 8am to 5pm)

## 2018-12-12 NOTE — Progress Notes (Signed)
Advanced Heart Failure Rounding Note  PCP-Cardiologist: [name], MD   Subjective:    Still sore from gout but getting better. Breathing ok. Still feels swollen. Entresto held this am due to SBP 95-100. Denies dizziness. Creatinine up to 2.27. Co-ox 58%,=   Objective:   Weight Range: (!) 147.6 kg Body mass index is 50.96 kg/m.   Vital Signs:   Temp:  [97.5 F (36.4 C)-99.3 F (37.4 C)] 97.9 F (36.6 C) (10/06 0020) Pulse Rate:  [89-100] 100 (10/06 0020) Resp:  [16-22] 22 (10/06 0253) BP: (70-116)/(25-66) 109/64 (10/06 0020) SpO2:  [90 %-97 %] 91 % (10/06 0020) Weight:  [147.6 kg] 147.6 kg (10/06 0253) Last BM Date: 12/10/18  Weight change: Filed Weights   12/09/18 0509 12/10/18 0520 12/12/18 0253  Weight: (!) 148.8 kg (!) 147.2 kg (!) 147.6 kg    Intake/Output:   Intake/Output Summary (Last 24 hours) at 12/12/2018 0735 Last data filed at 12/12/2018 0658 Gross per 24 hour  Intake 415.98 ml  Output 2075 ml  Net -1659.02 ml      Physical Exam    General:  Obese male lying in bed. No resp difficulty HEENT: normal Neck: supple. JVP hard to see but appears elevated Carotids 2+ bilat; no bruits. No lymphadenopathy or thryomegaly appreciated. Cor: PMI nondisplaced. Regular tachy . No rubs, gallops or murmurs. Lungs: coarse  Decreased throughout  Abdomen: obese soft, nontender, nondistended. No hepatosplenomegaly. No bruits or masses. Good bowel sounds. Extremities: no cyanosis, clubbing, rash, 2+ edema Neuro: alert & orientedx3, cranial nerves grossly intact. moves all 4 extremities w/o difficulty. Affect pleasant    Telemetry   Sinus tach ~ 100  w/ occasional PVCs Personally reviewed   EKG    No new EKG to review today  Labs    CBC No results for input(s): WBC, NEUTROABS, HGB, HCT, MCV, PLT in the last 72 hours. Basic Metabolic Panel Recent Labs    12/11/18 0500 12/12/18 0354  NA 132* 132*  K 5.1 4.8  CL 91* 95*  CO2 28 27  GLUCOSE  222* 321*  BUN 46* 72*  CREATININE 2.13* 2.27*  CALCIUM 8.9 8.5*   Liver Function Tests No results for input(s): AST, ALT, ALKPHOS, BILITOT, PROT, ALBUMIN in the last 72 hours. No results for input(s): LIPASE, AMYLASE in the last 72 hours. Cardiac Enzymes No results for input(s): CKTOTAL, CKMB, CKMBINDEX, TROPONINI in the last 72 hours.  BNP: BNP (last 3 results) Recent Labs    12/05/18 1825  BNP 111.3*    ProBNP (last 3 results) No results for input(s): PROBNP in the last 8760 hours.   D-Dimer No results for input(s): DDIMER in the last 72 hours. Hemoglobin A1C No results for input(s): HGBA1C in the last 72 hours. Fasting Lipid Panel No results for input(s): CHOL, HDL, LDLCALC, TRIG, CHOLHDL, LDLDIRECT in the last 72 hours. Thyroid Function Tests No results for input(s): TSH, T4TOTAL, T3FREE, THYROIDAB in the last 72 hours.  Invalid input(s): FREET3  Other results:   Imaging    No results found.   Medications:     Scheduled Medications: . amiodarone  200 mg Oral Daily  . atorvastatin  40 mg Oral Daily  . Chlorhexidine Gluconate Cloth  6 each Topical Daily  . guaiFENesin  600 mg Oral BID  . insulin aspart  0-15 Units Subcutaneous TID WC  . insulin aspart  7 Units Subcutaneous TID WC  . insulin detemir  30 Units Subcutaneous BID  . methylPREDNISolone (SOLU-MEDROL)  injection  60 mg Intravenous Daily  . pantoprazole  40 mg Oral Daily  . polyethylene glycol  17 g Oral Daily  . sacubitril-valsartan  1 tablet Oral BID  . senna-docusate  2 tablet Oral BID  . sodium chloride flush  10-40 mL Intracatheter Q12H  . sodium chloride flush  3 mL Intravenous Q12H  . traMADol  50 mg Oral QHS  . Warfarin - Pharmacist Dosing Inpatient   Does not apply q1800    Infusions: . sodium chloride    . sodium chloride 10 mL/hr at 12/12/18 0609    PRN Medications: sodium chloride, acetaminophen, guaiFENesin-dextromethorphan, HYDROmorphone (DILAUDID) injection, oxyCODONE,  sodium chloride flush, sodium chloride flush    Patient Profile   Mr.Overcashis a 68 y/o morbidly obese WM,with positive PMH of chronic combined systolic and diastolic CHF/ [id number]) Echo: EF 20-25%, A-fib w/ RVR (2010 s/p failed cardioversion), now on amiodarone, chronic anticoagulation on coumadin w/ INRs followed by PCP, IDDM, HTN and OSAnoncompliant w/ CPAP who was lost to f/u in the [facility] and not seen since 2015, now presenting w/ acute on chronic systolic HF, for which AHF has been consulted, at the request of Dr. Nevada Crane, Internal Medicine.  Assessment/Plan   1. Acute on Chronic Systolic and Diastolic CHF: diagnosed in 2013. LHC 03/06/12 showed normal coronaries. Previous Echo in 2015 w/ EF at 15-20% w/ mod MR and moderately elevated PA pressure 54 mmHg. Presented 12/06/18 w/ 4 week history of progressive bilateral LEE and exertional and resting dyspnea. Volume overloaded on exam w/ 2+ bilateral LEE.BNP only 111 however suspect falsely low given morbid obesity. Chest x-ray demonstrated cardiomegaly with mild pulmonary vascular congestion. Repeat 2D echo 9/30 with persistently low LVEF but slightly improved from prior, now at 25-30% w/ diffuse hypokinesis and moderately elevated RVSP at 50.3 mmHg. - lasix gtt stopped several days ago. CVP remains elevated. Creatinine continues to climb. Co-ox marginal. - For formal RHC -Continue to hold ? blocker (Toprol XL). Can consider adding back as outpatient.  -Given concomitant T2DM, consider later addition of an SGLT2i (can add as outpatient)  2. PAF: currently NSR. HR in the 90s w/ occasional PVCs -Continue amiodarone 200 mg daily -On coumadin. INR therapeutic today at 2.7 -Continue coumadin dosing per pharmacy  3. OSA: noncompliant w/ CPAP at home - refusing to use while inpatient   4. IDDM: poorly controlled. Hgb A1c 13.3 - getting insulin - management per IM  -Consider addition of an SGLT2i given concomitant systolic HF w/ EF <  [id number]  5. AKI:  - Cr 1.4 -> 1.5 -> 2.1 -> 2.2 -continue to hold lasix, spiro. Stop Entesto - may need inotropes  -f/u BMP in the AM  6. Acute Gout Flare: h/o gout. Complains of bilateral hip and knee pain c/w prior attacks. Likely triggered by aggressive diuresis w/ lasix. Uric acid elevated at 10.  - on steroids and colchicine per primary team    Length of Stay: [place], MD  12/12/2018, 7:35 AM  Advanced Heart Failure Team Pager [phone] (M-F; 7a - 4p)  Please contact [facility] for night-coverage after hours (4p -7a ) and weekends on [email or web address]

## 2018-12-12 NOTE — Progress Notes (Signed)
[facility] for Warfarin Indication: atrial fibrillation  Allergies  Allergen Reactions  . Actos [Pioglitazone] Other (See Comments)    Made the patient retain fluid    Patient Measurements: Height: 5\' 7"  (170.2 cm) Weight: (!) 325 lb 6.4 oz (147.6 kg) IBW/kg (Calculated) : 66.1  Vital Signs: Temp: 98.4 F (36.9 C) (10/06 0931) Temp Source: Oral (10/06 0931) BP: 103/54 (10/06 0931) Pulse Rate: 80 (10/06 0910)  Labs: Recent Labs    12/10/18 0525 12/11/18 0500 12/12/18 0354  LABPROT 22.6* 25.6* 28.2*  INR 2.0* 2.4* 2.7*  CREATININE 1.55* 2.13* 2.27*    Estimated Creatinine Clearance: 44.1 mL/min (A) (by C-G formula based on SCr of 2.27 mg/dL (H)).   Assessment: 68 y.o. male presenting with SOB. Pt continues on warfarin for history of afib. INR is at goal @ 2.7 - trending up with some boost dose. No bleeding noted.  Amio 200mg  daily increased from PTA 100mg  daily PTA warfarin dose 7.5mg  TThSaSu, 5mg  MWF - will flip flop PTA dose to try prevent INR >3  Goal of Therapy:  INR 2-3 Monitor platelets by anticoagulation protocol: Yes   Plan:  Warfarin 7.5mg  MWF and 5mg  TTSS Daily INR Monitor CBC   Bonnita Nasuti Pharm.D. CPP, BCPS Clinical Pharmacist [phone] 12/12/2018 10:47 AM

## 2018-12-12 NOTE — Progress Notes (Signed)
Received call from MD this morning and given order to hold Entresto and any other blood pressure meds until notified by cardiology d/t patient blood pressure being [id number] systolic. Will pass this information on to the day shift RN. Will continue to monitor. Lajoyce Corners, RN

## 2018-12-12 NOTE — Interval H&P Note (Signed)
History and Physical Interval Note:  12/12/2018 7:42 AM  Raymond Pratt  has presented today for surgery, with the diagnosis of heart failure.  The various methods of treatment have been discussed with the patient and family. After consideration of risks, benefits and other options for treatment, the patient has consented to  Procedure(s): RIGHT HEART CATH (N/A) as a surgical intervention.  The patient's history has been reviewed, patient examined, no change in status, stable for surgery.  I have reviewed the patient's chart and labs.  Questions were answered to the patient's satisfaction.     Daniel Bensimhon

## 2018-12-12 NOTE — H&P (View-Only) (Signed)
Advanced Heart Failure Rounding Note  PCP-Cardiologist: [name], MD   Subjective:    Still sore from gout but getting better. Breathing ok. Still feels swollen. Entresto held this am due to SBP 95-100. Denies dizziness. Creatinine up to 2.27. Co-ox 58%,=   Objective:   Weight Range: (!) 147.6 kg Body mass index is 50.96 kg/m.   Vital Signs:   Temp:  [97.5 F (36.4 C)-99.3 F (37.4 C)] 97.9 F (36.6 C) (10/06 0020) Pulse Rate:  [89-100] 100 (10/06 0020) Resp:  [16-22] 22 (10/06 0253) BP: (70-116)/(25-66) 109/64 (10/06 0020) SpO2:  [90 %-97 %] 91 % (10/06 0020) Weight:  [147.6 kg] 147.6 kg (10/06 0253) Last BM Date: 12/10/18  Weight change: Filed Weights   12/09/18 0509 12/10/18 0520 12/12/18 0253  Weight: (!) 148.8 kg (!) 147.2 kg (!) 147.6 kg    Intake/Output:   Intake/Output Summary (Last 24 hours) at 12/12/2018 0735 Last data filed at 12/12/2018 0658 Gross per 24 hour  Intake 415.98 ml  Output 2075 ml  Net -1659.02 ml      Physical Exam    General:  Obese male lying in bed. No resp difficulty HEENT: normal Neck: supple. JVP hard to see but appears elevated Carotids 2+ bilat; no bruits. No lymphadenopathy or thryomegaly appreciated. Cor: PMI nondisplaced. Regular tachy . No rubs, gallops or murmurs. Lungs: coarse  Decreased throughout  Abdomen: obese soft, nontender, nondistended. No hepatosplenomegaly. No bruits or masses. Good bowel sounds. Extremities: no cyanosis, clubbing, rash, 2+ edema Neuro: alert & orientedx3, cranial nerves grossly intact. moves all 4 extremities w/o difficulty. Affect pleasant    Telemetry   Sinus tach ~ 100  w/ occasional PVCs Personally reviewed   EKG    No new EKG to review today  Labs    CBC No results for input(s): WBC, NEUTROABS, HGB, HCT, MCV, PLT in the last 72 hours. Basic Metabolic Panel Recent Labs    12/11/18 0500 12/12/18 0354  NA 132* 132*  K 5.1 4.8  CL 91* 95*  CO2 28 27  GLUCOSE  222* 321*  BUN 46* 72*  CREATININE 2.13* 2.27*  CALCIUM 8.9 8.5*   Liver Function Tests No results for input(s): AST, ALT, ALKPHOS, BILITOT, PROT, ALBUMIN in the last 72 hours. No results for input(s): LIPASE, AMYLASE in the last 72 hours. Cardiac Enzymes No results for input(s): CKTOTAL, CKMB, CKMBINDEX, TROPONINI in the last 72 hours.  BNP: BNP (last 3 results) Recent Labs    12/05/18 1825  BNP 111.3*    ProBNP (last 3 results) No results for input(s): PROBNP in the last 8760 hours.   D-Dimer No results for input(s): DDIMER in the last 72 hours. Hemoglobin A1C No results for input(s): HGBA1C in the last 72 hours. Fasting Lipid Panel No results for input(s): CHOL, HDL, LDLCALC, TRIG, CHOLHDL, LDLDIRECT in the last 72 hours. Thyroid Function Tests No results for input(s): TSH, T4TOTAL, T3FREE, THYROIDAB in the last 72 hours.  Invalid input(s): FREET3  Other results:   Imaging    No results found.   Medications:     Scheduled Medications: . amiodarone  200 mg Oral Daily  . atorvastatin  40 mg Oral Daily  . Chlorhexidine Gluconate Cloth  6 each Topical Daily  . guaiFENesin  600 mg Oral BID  . insulin aspart  0-15 Units Subcutaneous TID WC  . insulin aspart  7 Units Subcutaneous TID WC  . insulin detemir  30 Units Subcutaneous BID  . methylPREDNISolone (SOLU-MEDROL)  injection  60 mg Intravenous Daily  . pantoprazole  40 mg Oral Daily  . polyethylene glycol  17 g Oral Daily  . sacubitril-valsartan  1 tablet Oral BID  . senna-docusate  2 tablet Oral BID  . sodium chloride flush  10-40 mL Intracatheter Q12H  . sodium chloride flush  3 mL Intravenous Q12H  . traMADol  50 mg Oral QHS  . Warfarin - Pharmacist Dosing Inpatient   Does not apply q1800    Infusions: . sodium chloride    . sodium chloride 10 mL/hr at 12/12/18 0609    PRN Medications: sodium chloride, acetaminophen, guaiFENesin-dextromethorphan, HYDROmorphone (DILAUDID) injection, oxyCODONE,  sodium chloride flush, sodium chloride flush    Patient Profile   Mr.Overcashis a 68 y/o morbidly obese WM,with positive PMH of chronic combined systolic and diastolic CHF/ [id number]) Echo: EF 20-25%, A-fib w/ RVR (2010 s/p failed cardioversion), now on amiodarone, chronic anticoagulation on coumadin w/ INRs followed by PCP, IDDM, HTN and OSAnoncompliant w/ CPAP who was lost to f/u in the [facility] and not seen since 2015, now presenting w/ acute on chronic systolic HF, for which AHF has been consulted, at the request of Dr. Nevada Crane, Internal Medicine.  Assessment/Plan   1. Acute on Chronic Systolic and Diastolic CHF: diagnosed in 2013. LHC 03/06/12 showed normal coronaries. Previous Echo in 2015 w/ EF at 15-20% w/ mod MR and moderately elevated PA pressure 54 mmHg. Presented 12/06/18 w/ 4 week history of progressive bilateral LEE and exertional and resting dyspnea. Volume overloaded on exam w/ 2+ bilateral LEE.BNP only 111 however suspect falsely low given morbid obesity. Chest x-ray demonstrated cardiomegaly with mild pulmonary vascular congestion. Repeat 2D echo 9/30 with persistently low LVEF but slightly improved from prior, now at 25-30% w/ diffuse hypokinesis and moderately elevated RVSP at 50.3 mmHg. - lasix gtt stopped several days ago. CVP remains elevated. Creatinine continues to climb. Co-ox marginal. - For formal RHC -Continue to hold ? blocker (Toprol XL). Can consider adding back as outpatient.  -Given concomitant T2DM, consider later addition of an SGLT2i (can add as outpatient)  2. PAF: currently NSR. HR in the 90s w/ occasional PVCs -Continue amiodarone 200 mg daily -On coumadin. INR therapeutic today at 2.7 -Continue coumadin dosing per pharmacy  3. OSA: noncompliant w/ CPAP at home - refusing to use while inpatient   4. IDDM: poorly controlled. Hgb A1c 13.3 - getting insulin - management per IM  -Consider addition of an SGLT2i given concomitant systolic HF w/ EF <  [id number]  5. AKI:  - Cr 1.4 -> 1.5 -> 2.1 -> 2.2 -continue to hold lasix, spiro. Stop Entesto - may need inotropes  -f/u BMP in the AM  6. Acute Gout Flare: h/o gout. Complains of bilateral hip and knee pain c/w prior attacks. Likely triggered by aggressive diuresis w/ lasix. Uric acid elevated at 10.  - on steroids and colchicine per primary team    Length of Stay: [place], MD  12/12/2018, 7:35 AM  Advanced Heart Failure Team Pager [phone] (M-F; 7a - 4p)  Please contact [facility] for night-coverage after hours (4p -7a ) and weekends on [email or web address]

## 2018-12-12 NOTE — Progress Notes (Signed)
OT Cancellation Note  Patient Details Name: Raymond Pratt MRN: [id number] DOB: 10-19-1950   Cancelled Treatment:    Reason Eval/Treat Not Completed: Fatigue/lethargy limiting ability to participate;Other (comment). Pt just returned from cath lab and resting. Will try back this afternoon as appropriate/as able  Britt Bottom 12/12/2018, 10:15 AM

## 2018-12-12 NOTE — Evaluation (Signed)
Occupational Therapy Evaluation Patient Details Name: Raymond Pratt MRN: [id number] DOB: 23-Dec-1950 Today's Date: 12/12/2018    History of Present Illness Patient is a 68 y/o male who presents with SOB and edema BLEs. EKG- 1st degree heart block. Admitted with acute on chronic CHF and gout flare up since admission. s/p cardiac cath 10/6.  PMH includes NICM, A-fib, HTN, DM, CAD.   Clinical Impression   Pt with decline in function and safety with ADLs and ADL mobility with impaired strength, balance and endurance. Pt seems to have a very sad mood about his current medical condition and lack of perceived progress. Pt reports that PTA, he lived at home alone and was independent with ADLs/selfcare and has a cane. Pt currently requires max - total A with LB ADLs/selfcare and total A with toileting and total A + 2 to stand from EOB to RW. Pt would benefit from acute OT services to address impairments to maximize level of function and safety      Follow Up Recommendations  SNF    Equipment Recommendations  None recommended by OT;Other (comment)(TBD at next venue of care)    Recommendations for Other Services       Precautions / Restrictions Precautions Precautions: Fall Precaution Comments: watch 02, gout flare up BLEs Restrictions Weight Bearing Restrictions: No      Mobility Bed Mobility Overal bed mobility: Needs Assistance Bed Mobility: Supine to Sit;Sit to Supine     Supine to sit: Supervision;HOB elevated Sit to supine: Supervision   General bed mobility comments: Able to pull self up into long sitting with heavy use of rails and able to get to EOB with supervision and use of rails.  Transfers Overall transfer level: Needs assistance Equipment used: Rolling walker (2 wheeled) Transfers: Sit to/from Stand Sit to Stand: Total assist;+2 physical assistance;From elevated surface         General transfer comment: Attempted to stand from low bed height but unable to clear  bottom despite total A of 2; stood from elevated bed height with use of momentum and RW as well as cues for hand placement. Cues for upright posture. very weak.    Balance Overall balance assessment: Needs assistance Sitting-balance support: Feet supported;No upper extremity supported Sitting balance-Leahy Scale: Good Sitting balance - Comments: Able to reach down to shins without LOB, practiced scooting along side bed with Mod A and cues for technique.   Standing balance support: During functional activity;Bilateral upper extremity supported Standing balance-Leahy Scale: Poor Standing balance comment: External support provided and BUEs on RW. Flexed trunk.                           ADL either performed or assessed with clinical judgement   ADL Overall ADL's : Needs assistance/impaired Eating/Feeding: Independent;Sitting   Grooming: Wash/dry hands;Wash/dry face;Sitting;Min guard   Upper Body Bathing: Min guard;Sitting   Lower Body Bathing: Maximal assistance   Upper Body Dressing : Min guard;Sitting   Lower Body Dressing: Total assistance   Toilet Transfer: Total assistance;+2 for physical assistance   Toileting- Clothing Manipulation and Hygiene: Total assistance       Functional mobility during ADLs: Total assistance;+2 for physical assistance;Rolling walker       Vision Patient Visual Report: No change from baseline       Perception     Praxis      Pertinent Vitals/Pain Pain Assessment: 0-10 Pain Score: 9  Pain Location: right arm; BLEs with standing  Pain Descriptors / Indicators: Sore Pain Intervention(s): Limited activity within patient's tolerance;Monitored during session;Repositioned     Hand Dominance Right   Extremity/Trunk Assessment Upper Extremity Assessment Upper Extremity Assessment: Generalized weakness   Lower Extremity Assessment Lower Extremity Assessment: Defer to PT evaluation RLE Sensation: decreased light touch LLE  Sensation: decreased light touch       Communication Communication Communication: No difficulties   Cognition Arousal/Alertness: Awake/alert Behavior During Therapy: WFL for tasks assessed/performed Overall Cognitive Status: Within Functional Limits for tasks assessed                                 General Comments: Seems in a depressed mood about his health and lack of progress/weakness.   General Comments  Sp02 >87% on 2L.min 02,. Pt with tendency to hold breath during transitions. Pre activity BP 107/48, post activity BP 101/70. Asymptomatic.    Exercises     Shoulder Instructions      Home Living Family/patient expects to be discharged to:: Private residence Living Arrangements: Alone Available Help at Discharge: Family;Available PRN/intermittently Type of Home: House Home Access: Stairs to enter [employer] of Steps: 1 Entrance Stairs-Rails: None Home Layout: Two level;Able to live on main level with bedroom/bathroom     Bathroom Shower/Tub: Teacher, early years/pre: Standard     Home Equipment: Cane - single point          Prior Functioning/Environment Level of Independence: Independent        Comments: Drove himself to the hospital.        OT Problem List: Decreased strength;Impaired balance (sitting and/or standing);Pain;Obesity;Decreased activity tolerance;Decreased knowledge of use of DME or AE      OT Treatment/Interventions: Self-care/ADL training;DME and/or AE instruction;Therapeutic activities;Therapeutic exercise;Patient/family education    OT Goals(Current goals can be found in the care plan section) Acute Rehab OT Goals Patient Stated Goal: to get stronger and be able to walk OT Goal Formulation: With patient Time For Goal Achievement: 12/26/18 Potential to Achieve Goals: Good ADL Goals Pt Will Perform Grooming: with supervision;with set-up;sitting Pt Will Perform Upper Body Bathing: with  supervision;with set-up;sitting Pt Will Perform Lower Body Bathing: with mod assist;sitting/lateral leans Pt Will Perform Upper Body Dressing: with supervision;with set-up;sitting Pt Will Transfer to Toilet: with max assist;stand pivot transfer;bedside commode  OT Frequency: Min 2X/week   Barriers to D/C: Decreased caregiver support          Co-evaluation PT/OT/SLP Co-Evaluation/Treatment: Yes Reason for Co-Treatment: Complexity of the patient's impairments (multi-system involvement);For patient/therapist safety;To address functional/ADL transfers PT goals addressed during session: Mobility/safety with mobility;Strengthening/ROM OT goals addressed during session: ADL's and self-care;Proper use of Adaptive equipment and DME      AM-PAC OT "6 Clicks" Daily Activity     Outcome Measure Help from another person eating meals?: None Help from another person taking care of personal grooming?: A Little Help from another person toileting, which includes using toliet, bedpan, or urinal?: Total Help from another person bathing (including washing, rinsing, drying)?: Total Help from another person to put on and taking off regular upper body clothing?: A Little Help from another person to put on and taking off regular lower body clothing?: Total 6 Click Score: 13   End of Session Equipment Utilized During Treatment: Rolling walker;Oxygen  Activity Tolerance: Patient limited by fatigue Patient left: in bed;with call bell/phone within reach  OT Visit Diagnosis: Unsteadiness on feet (R26.81);Other abnormalities of gait  and mobility (R26.89);Muscle weakness (generalized) (M62.81);Pain Pain - Right/Left: (bilaterally) Pain - part of body: Arm;Leg(R UE, B LEs)                Time: [id number] OT Time Calculation (min): 24 min Charges:  OT General Charges $OT Visit: 1 Visit OT Evaluation $OT Eval Moderate Complexity: 1 Mod    Britt Bottom 12/12/2018, 3:09 PM

## 2018-12-12 NOTE — TOC Initial Note (Signed)
Transition of Care [facility]) - Initial/Assessment Note    Patient Details  Name: Raymond Pratt MRN: [id number] Date of Birth: 10/05/1950  Transition of Care [facility]) CM/SW Contact:    Vinie Sill, [place] Phone Number: 12/12/2018, 5:46 PM  Clinical Narrative:                   Expected Discharge Plan: [place]   CSW visit with the patient at bedside along with his friend, Benjamine Mola and his granddaughter Lilia Pro, participated by phone. CSW introduced self and explained role. CSW discussed PT recommendation of ST rehab at [facility]. Patient states he lives alone and was agreeable to SNF placement. Patient states no preference for SNF and gave CSW permission to fax out referrals to SNFs.   Patient inquired about POA and expressed he wants his granddaughter to have his home, etc. CSW recommended he retain a Chief Executive Officer for POA advise and guidance . Patient states chaplin provided HCPOA paperwork. Patient states no other questions at this time.  Thurmond Butts, MSW, [facility] Clinical Social Worker [phone]   Patient Goals and CMS Choice        Expected Discharge Plan and Services Expected Discharge Plan: [place] arrangements for the past 2 months: Single Family Home                                      Prior Living Arrangements/Services Living arrangements for the past 2 months: Single Family Home Lives with:: Self Patient language and need for interpreter reviewed:: Yes Do you feel safe going back to the place where you live?: No   agreeable to ST Rehab at SNF  Need for Family Participation in Patient Care: Yes (Comment) Care giver support system in place?: Yes (comment)   Criminal Activity/Legal Involvement Pertinent to Current Situation/Hospitalization: No - Comment as needed  Activities of Daily Living Home Assistive Devices/Equipment: None ADL Screening (condition at time of admission) Patient's cognitive ability adequate  to safely complete daily activities?: Yes Is the patient deaf or have difficulty hearing?: No Does the patient have difficulty seeing, even when wearing glasses/contacts?: No Does the patient have difficulty concentrating, remembering, or making decisions?: No Patient able to express need for assistance with ADLs?: No Does the patient have difficulty dressing or bathing?: No Independently performs ADLs?: No Communication: Independent Dressing (OT): Independent Grooming: Independent Feeding: Independent Bathing: Independent Toileting: Independent In/Out Bed: Independent Does the patient have difficulty walking or climbing stairs?: No Weakness of Legs: None Weakness of Arms/Hands: None  Permission Sought/Granted Permission sought to share information with : Family Supports Permission granted to share information with : Yes, Verbal Permission Granted              Emotional Assessment Appearance:: Appears stated age Attitude/Demeanor/Rapport: Engaged Affect (typically observed): Accepting, Appropriate Orientation: : Oriented to Self, Oriented to Place, Oriented to  Time, Oriented to Situation, Fluctuating Orientation (Suspected and/or reported Sundowners) Alcohol / Substance Use: Not Applicable Psych Involvement: No (comment)  Admission diagnosis:  SOB (shortness of breath) [R06.02] Patient Active Problem List   Diagnosis Date Noted  . CHF (congestive heart failure) ([place]) 12/05/2018  . Anemia   . Gram-negative bacteremia 02/12/2014  . Chills (without fever) 02/11/2014  . Acute liver failure 12/28/2013  . Acute on chronic heart failure ([place]) 12/28/2013  . A-fib ([place]) 03/07/2013  . HTN (hypertension) 02/16/2013  .  Chronic combined systolic and diastolic heart failure ([place]) 02/01/2013  . Anticoagulated on Coumadin 01/15/2013  . Hyposmolality and/or hyponatremia 03/04/2012  . OSA on CPAP 03/04/2012  . Acute on chronic combined systolic and diastolic heart failure, NYHA class  4 ([place]) 02/28/2012  . Chest pressure 02/28/2012  . Type 2 diabetes mellitus with hyperlipidemia ([place]) 08/04/2008  . ATRIAL FIBRILLATION WITH RAPID VENTRICULAR RESPONSE 08/04/2008  . UTI 08/04/2008  . CELLULITIS, LEGS 08/04/2008  . OTHER ASCITES 08/04/2008   PCP:  Shon Baton, MD Pharmacy:   [place], [place] [phone] [place] [id number] [place] [address] Phone: [phone] Fax: [phone]     Social Determinants of Health (SDOH) Interventions    Readmission Risk Interventions No flowsheet data found.

## 2018-12-13 LAB — BASIC METABOLIC PANEL
Anion gap: 12 (ref 5–15)
BUN: 72 mg/dL — ABNORMAL HIGH (ref 8–23)
CO2: 25 mmol/L (ref 22–32)
Calcium: 8.5 mg/dL — ABNORMAL LOW (ref 8.9–10.3)
Chloride: 96 mmol/L — ABNORMAL LOW (ref 98–111)
Creatinine, Ser: 1.54 mg/dL — ABNORMAL HIGH (ref 0.61–1.24)
GFR calc Af Amer: 53 mL/min — ABNORMAL LOW (ref >60)
GFR calc non Af Amer: 46 mL/min — ABNORMAL LOW (ref >60)
Glucose, Bld: 338 mg/dL — ABNORMAL HIGH (ref 70–99)
Potassium: 5 mmol/L (ref 3.5–5.1)
Sodium: 133 mmol/L — ABNORMAL LOW (ref 135–145)

## 2018-12-13 LAB — CBC
HCT: 35.2 % — ABNORMAL LOW (ref 39.0–52.0)
Hemoglobin: 10.9 g/dL — ABNORMAL LOW (ref 13.0–17.0)
MCH: 27 pg (ref 26.0–34.0)
MCHC: 31 g/dL (ref 30.0–36.0)
MCV: 87.3 fL (ref 80.0–100.0)
Platelets: 455 10*3/uL — ABNORMAL HIGH (ref 150–400)
RBC: 4.03 MIL/uL — ABNORMAL LOW (ref 4.22–5.81)
RDW: 17.9 % — ABNORMAL HIGH (ref 11.5–15.5)
WBC: 11.5 10*3/uL — ABNORMAL HIGH (ref 4.0–10.5)
nRBC: 0 % (ref 0.0–0.2)

## 2018-12-13 LAB — PROTIME-INR
INR: 3.4 — ABNORMAL HIGH (ref 0.8–1.2)
Prothrombin Time: 33.6 seconds — ABNORMAL HIGH (ref 11.4–15.2)

## 2018-12-13 LAB — GLUCOSE, CAPILLARY
Glucose-Capillary: 326 mg/dL — ABNORMAL HIGH (ref 70–99)
Glucose-Capillary: 354 mg/dL — ABNORMAL HIGH (ref 70–99)
Glucose-Capillary: 309 mg/dL — ABNORMAL HIGH (ref 70–99)
Glucose-Capillary: 317 mg/dL — ABNORMAL HIGH (ref 70–99)

## 2018-12-13 MED ORDER — INSULIN ASPART 100 UNIT/ML ~~LOC~~ SOLN
5.0000 [IU] | Freq: Once | SUBCUTANEOUS | Status: AC
  Administered 2018-12-13: 23:00:00 5 [IU] via SUBCUTANEOUS

## 2018-12-13 MED ORDER — INSULIN DETEMIR 100 UNIT/ML ~~LOC~~ SOLN
35.0000 [IU] | Freq: Two times a day (BID) | SUBCUTANEOUS | Status: DC
  Administered 2018-12-13 – 2018-12-14 (×2): 35 [IU] via SUBCUTANEOUS
  Filled 2018-12-13 (×3): qty 0.35

## 2018-12-13 MED ORDER — PREDNISONE 20 MG PO TABS
40.0000 mg | ORAL_TABLET | Freq: Every day | ORAL | Status: DC
  Administered 2018-12-14: 08:00:00 40 mg via ORAL
  Filled 2018-12-13: qty 2

## 2018-12-13 MED ORDER — TORSEMIDE 20 MG PO TABS
60.0000 mg | ORAL_TABLET | Freq: Two times a day (BID) | ORAL | Status: DC
  Administered 2018-12-13 – 2018-12-14 (×2): 60 mg via ORAL
  Filled 2018-12-13 (×2): qty 3

## 2018-12-13 MED ORDER — WARFARIN SODIUM 1 MG PO TABS
1.0000 mg | ORAL_TABLET | Freq: Once | ORAL | Status: AC
  Administered 2018-12-13: 1 mg via ORAL
  Filled 2018-12-13: qty 1

## 2018-12-13 MED ORDER — ALLOPURINOL 300 MG PO TABS
300.0000 mg | ORAL_TABLET | Freq: Every day | ORAL | Status: DC
  Administered 2018-12-13 – 2018-12-14 (×2): 300 mg via ORAL
  Filled 2018-12-13 (×2): qty 1

## 2018-12-13 NOTE — Progress Notes (Signed)
PROGRESS NOTE    Raymond Pratt  [id number] DOB: 1950/04/10 DOA: 12/05/2018 PCP: Shon Baton, MD   Brief Narrative: 68 year old with past medical history significant for nonischemic cardiomyopathy with ejection fraction of 50 to 20% on echo on 2015, A. fib on Coumadin, hypertension, type 2 diabetes, OSA on CPAP who presented with concerns of worsening shortness of breath.  Patient reports that he has had gradual worsening of his shortness of breath for the past several weeks.  He was able to lay flat about 3 weeks ago but has had to prop himself up with more pillows due to shortness of breath at night.  He also noted worsening lower extremity edema.  He also report abdominal bloating.  He has been taking torsemide daily .  Been also taking metolazone but he stopped it a month ago.  Admitted with acute on chronic heart failure exacerbation and gout flare.   Assessment & Plan:   Active Problems:   Type 2 diabetes mellitus with hyperlipidemia (HCC)   OSA on CPAP   Anticoagulated on Coumadin   CHF (congestive heart failure) (HCC)  1-acute on chronic systolic heart failure exacerbation with volume overload, medication noncompliance 2D echo show ejection fraction 25 to 30% on 11/19/2018 -11 L Heart cath performed on 10/6 pressure stable. Plan was to resume oral diuretics today. Appreciate heart failure team following  Worsening AKI on chronic kidney disease stage III: Creatinine baseline 1.3 Creatinine increased to 2.2 with diuretics. Holding diuretics.  Creatinine trending down today at 1.5 - Mild hyperkalemia   Acute severe multiple joint pain likely gout flare: Uric acid 10.3. Continue with colchicine will change Solu-Medrol to prednisone his pain has improved. Will consider start allopurinol  hypotension related to [facility] improved stable  Acute hypoxic respiratory failure: Related to pulmonary edema secondary to heart failure   Diabetes type 2: Continue with Levemir,  increased dose.  Paroxysmal A. fib on Coumadin Heart failure team following On amiodarone Iron deficiency anemia received IV Feraheme   Estimated body mass index is 50.45 kg/m as calculated from the following:   Height as of this encounter: 5\' 7"  (1.702 m).   Weight as of this encounter: 146.1 kg.   DVT prophylaxis: Coumadin Code Status: DNR Family Communication: Care discussed with patient Disposition Plan: Patient remain inpatient for treatment of heart failure transition from IV Solu-Medrol to oral prednisone and resumption of diuretics.  Keep overnight to monitor renal function and resumption of diuretic Consultants:  Cardiology Procedures:   Iliac cath  Antimicrobials:    Subjective: , Dyspnea improved.  Pain from gout improving  Objective: Vitals:   12/13/18 0500 12/13/18 0807 12/13/18 1152 12/13/18 1633  BP: 104/70 108/64 123/64 (!) 97/56  Pulse: 77  74 (!) 117  Resp: 16 20 20 16   Temp: 97.8 F (36.6 C) 98.4 F (36.9 C) 98 F (36.7 C) 98.1 F (36.7 C)  TempSrc: Oral Oral Oral Oral  SpO2: 90%  95% 94%  Weight: (!) 146.1 kg     Height:        Intake/Output Summary (Last 24 hours) at 12/13/2018 1853 Last data filed at 12/13/2018 1631 Gross per 24 hour  Intake 440 ml  Output 2131 ml  Net -1691 ml   Filed Weights   12/10/18 0520 12/12/18 0253 12/13/18 0500  Weight: (!) 147.2 kg (!) 147.6 kg (!) 146.1 kg    Examination:  General exam: Appears calm and comfortable  Respiratory system: Clear to auscultation. Respiratory effort normal. Cardiovascular system: S1 &  S2 heard, RRR. No JVD, murmurs, rubs, gallops or clicks. No pedal edema. Gastrointestinal system: Abdomen is nondistended, soft and nontender. No organomegaly or masses felt. Normal bowel sounds heard. Central nervous system: Alert and oriented. No focal neurological deficits. Extremities: Symmetric 5 x 5 power. Skin: No rashes, lesions or ulcers    Data Reviewed: I have personally reviewed  following labs and imaging studies  CBC: Recent Labs  Lab 12/12/18 0838 12/13/18 0517  WBC  --  11.5*  HGB 12.2*  11.9* 10.9*  HCT 36.0*  35.0* 35.2*  MCV  --  87.3  PLT  --  [id number]*   Basic Metabolic Panel: Recent Labs  Lab 12/09/18 0439 12/10/18 0525 12/11/18 0500 12/12/18 0354 12/12/18 0838 12/13/18 0517  NA 130* 131* 132* 132* 135  136 133*  K 4.3 4.6 5.1 4.8 4.8  4.7 5.0  CL 93* 89* 91* 95*  --  96*  CO2 29 29 28 27   --  25  GLUCOSE 275* 241* 222* 321*  --  338*  BUN 25* 33* 46* 72*  --  72*  CREATININE 1.40* 1.55* 2.13* 2.27*  --  1.54*  CALCIUM 8.2* 8.7* 8.9 8.5*  --  8.5*   GFR: Estimated Creatinine Clearance: 64.6 mL/min (A) (by C-G formula based on SCr of 1.54 mg/dL (H)). Liver Function Tests: No results for input(s): AST, ALT, ALKPHOS, BILITOT, PROT, ALBUMIN in the last 168 hours. No results for input(s): LIPASE, AMYLASE in the last 168 hours. No results for input(s): AMMONIA in the last 168 hours. Coagulation Profile: Recent Labs  Lab 12/09/18 0439 12/10/18 0525 12/11/18 0500 12/12/18 0354 12/13/18 0517  INR 2.1* 2.0* 2.4* 2.7* 3.4*   Cardiac Enzymes: No results for input(s): CKTOTAL, CKMB, CKMBINDEX, TROPONINI in the last 168 hours. BNP (last 3 results) No results for input(s): PROBNP in the last 8760 hours. HbA1C: No results for input(s): HGBA1C in the last 72 hours. CBG: Recent Labs  Lab 12/12/18 1748 12/12/18 2125 12/13/18 0512 12/13/18 1149 12/13/18 1637  GLUCAP 298* 382* 326* 354* 309*   Lipid Profile: No results for input(s): CHOL, HDL, LDLCALC, TRIG, CHOLHDL, LDLDIRECT in the last 72 hours. Thyroid Function Tests: No results for input(s): TSH, T4TOTAL, FREET4, T3FREE, THYROIDAB in the last 72 hours. Anemia Panel: No results for input(s): VITAMINB12, FOLATE, FERRITIN, TIBC, IRON, RETICCTPCT in the last 72 hours. Sepsis Labs: No results for input(s): PROCALCITON, LATICACIDVEN in the last 168 hours.  Recent Results (from the  past 240 hour(s))  SARS CORONAVIRUS 2 (TAT 6-24 HRS) Nasopharyngeal Nasopharyngeal Swab     Status: None   Collection Time: 12/05/18  6:25 PM   Specimen: Nasopharyngeal Swab  Result Value Ref Range Status   SARS Coronavirus 2 NEGATIVE NEGATIVE Final    Comment: (NOTE) SARS-CoV-2 target nucleic acids are NOT DETECTED. The SARS-CoV-2 RNA is generally detectable in upper and lower respiratory specimens during the acute phase of infection. Negative results do not preclude SARS-CoV-2 infection, do not rule out co-infections with other pathogens, and should not be used as the sole basis for treatment or other patient management decisions. Negative results must be combined with clinical observations, patient history, and epidemiological information. The expected result is Negative. Fact Sheet for Patients: SugarRoll.be Fact Sheet for Healthcare Providers: https://www.woods-mathews.com/ This test is not yet approved or cleared by the [place] FDA and  has been authorized for detection and/or diagnosis of SARS-CoV-2 by FDA under an Emergency Use Authorization (EUA). This EUA will remain  in effect (  meaning this test can be used) for the duration of the COVID-19 declaration under Section 56 4(b)(1) of the Act, 21 U.S.C. section 360bbb-3(b)(1), unless the authorization is terminated or revoked sooner. Performed at [facility], [place] [address], [place], [place] [address]          Radiology Studies: No results found.      Scheduled Meds: . amiodarone  200 mg Oral Daily  . atorvastatin  40 mg Oral Daily  . Chlorhexidine Gluconate Cloth  6 each Topical Daily  . guaiFENesin  600 mg Oral BID  . insulin aspart  0-15 Units Subcutaneous TID WC  . insulin aspart  7 Units Subcutaneous TID WC  . insulin detemir  35 Units Subcutaneous BID  . pantoprazole  40 mg Oral Daily  . polyethylene glycol  17 g Oral Daily  . [START ON 12/14/2018]  predniSONE  40 mg Oral Q breakfast  . sacubitril-valsartan  1 tablet Oral BID  . senna-docusate  2 tablet Oral BID  . sodium chloride flush  10-40 mL Intracatheter Q12H  . sodium chloride flush  3 mL Intravenous Q12H  . torsemide  60 mg Oral BID  . traMADol  50 mg Oral QHS  . Warfarin - Pharmacist Dosing Inpatient   Does not apply q1800   Continuous Infusions: . sodium chloride       LOS: 8 days    Time spent: 35 minutes    Elmarie Shiley, MD [facility] Pager [phone]  If 7PM-7AM, please contact night-coverage [email or web address] Password [facility] 12/13/2018, 6:53 PM

## 2018-12-13 NOTE — Progress Notes (Signed)
Inpatient Diabetes Program Recommendations  AACE/ADA: New Consensus Statement on Inpatient Glycemic Control (2015)  Target Ranges:  Prepandial:   less than 140 mg/dL      Peak postprandial:   less than 180 mg/dL (1-2 hours)      Critically ill patients:  140 - 180 mg/dL   Lab Results  Component Value Date   GLUCAP 326 (H) 12/13/2018   HGBA1C 13.3 (H) 12/06/2018    Review of Glycemic Control Results for Raymond Pratt, Raymond Pratt (MRN [id number]) as of 12/13/2018 09:47  Ref. Range 12/12/2018 17:48 12/12/2018 21:25 12/13/2018 05:12  Glucose-Capillary Latest Ref Range: 70 - 99 mg/dL 298 (H) 382 (H) 326 (H)   Diabetes history: DM2 Outpatient Diabetes medications: 70/30 65 units QAM, Regular 20 units with lunch and supper Current orders for Inpatient glycemic control: Levemir 30 units BID, Novolog 0-15 units TID with meals, Novolog 7 units TID with meals for meal coverage; Solumedrol 60 mg daily  Inpatient Diabetes Program Recommendations:   Insulin-Basal: If steroids are continued, please consider increasing Levemir to 35 units BID.  Insulin-Correction: Please consider ordering Novolog 0-5 units QHS for bedtime correction.  Thanks, Bronson Curb, MSN, RNC-OB Diabetes Coordinator [phone] (8a-5p)

## 2018-12-13 NOTE — Progress Notes (Addendum)
Advanced Heart Failure Rounding Note  PCP-Cardiologist: [name], MD   Subjective:   Diuretics have been held due to elevated creatinine. Creatining trending down 1.5.   Feeling better. Wants to go home.    RHC  RA = 10 RV = 55/8 PA = 54/15 (27) PCW = 16 Fick cardiac output/index = 7.0/2.8 Thermo CO/CI = 7.33/2.9 PVR = 1.5 Ao sat = 95% PA sat = 65%, 64% Assessment: 1. Relatively well compensated pressures with mild pulmonary HTN in setting of elevated cardiac output   Objective:   Weight Range: (!) 146.1 kg Body mass index is 50.45 kg/m.   Vital Signs:   Temp:  [97.8 F (36.6 C)-98.4 F (36.9 C)] 98 F (36.7 C) (10/07 1152) Pulse Rate:  [25-86] 74 (10/07 1152) Resp:  [12-25] 20 (10/07 1152) BP: (87-123)/(36-70) 123/64 (10/07 1152) SpO2:  [88 %-95 %] 95 % (10/07 1152) Weight:  [146.1 kg] 146.1 kg (10/07 0500) Last BM Date: 12/10/18  Weight change: Filed Weights   12/10/18 0520 12/12/18 0253 12/13/18 0500  Weight: (!) 147.2 kg (!) 147.6 kg (!) 146.1 kg    Intake/Output:   Intake/Output Summary (Last 24 hours) at 12/13/2018 1449 Last data filed at 12/13/2018 1242 Gross per 24 hour  Intake 440 ml  Output 1681 ml  Net -1241 ml      Physical Exam   CVP 10-11 General:   No resp difficulty. Morbidly obese male Sitting in the chair.  HEENT: normal anicteric  Neck: supple.JVP hard to assess due to body habitus. . Carotids 2+ bilat; no bruits. No lymphadenopathy or thryomegaly appreciated. Cor: PMI nondisplaced. Regular rate & rhythm. No rubs, gallops or murmurs. Lungs: clear on room air. No wheezes  Abdomen: obese, soft, nontender, nondistended. No hepatosplenomegaly. No bruits or masses. Good bowel sounds. Extremities: no cyanosis, clubbing, rash, R and LLE 1-2+ edema Neuro: alert & oriented x 3, cranial nerves grossly intact. moves all 4 extremities w/o difficulty. Affect pleasant   Telemetry   NSR 70s with PVCs.    EKG    No new EKG  to review today  Labs    CBC Recent Labs    12/12/18 0838 12/13/18 0517  WBC  --  11.5*  HGB 12.2*  11.9* 10.9*  HCT 36.0*  35.0* 35.2*  MCV  --  87.3  PLT  --  [id number]*   Basic Metabolic Panel Recent Labs    12/12/18 0354 12/12/18 0838 12/13/18 0517  NA 132* 135  136 133*  K 4.8 4.8  4.7 5.0  CL 95*  --  96*  CO2 27  --  25  GLUCOSE 321*  --  338*  BUN 72*  --  72*  CREATININE 2.27*  --  1.54*  CALCIUM 8.5*  --  8.5*   Liver Function Tests No results for input(s): AST, ALT, ALKPHOS, BILITOT, PROT, ALBUMIN in the last 72 hours. No results for input(s): LIPASE, AMYLASE in the last 72 hours. Cardiac Enzymes No results for input(s): CKTOTAL, CKMB, CKMBINDEX, TROPONINI in the last 72 hours.  BNP: BNP (last 3 results) Recent Labs    12/05/18 1825  BNP 111.3*    ProBNP (last 3 results) No results for input(s): PROBNP in the last 8760 hours.   D-Dimer No results for input(s): DDIMER in the last 72 hours. Hemoglobin A1C No results for input(s): HGBA1C in the last 72 hours. Fasting Lipid Panel No results for input(s): CHOL, HDL, LDLCALC, TRIG, CHOLHDL, LDLDIRECT in the last 72  hours. Thyroid Function Tests No results for input(s): TSH, T4TOTAL, T3FREE, THYROIDAB in the last 72 hours.  Invalid input(s): FREET3  Other results:   Imaging    No results found.   Medications:     Scheduled Medications: . amiodarone  200 mg Oral Daily  . atorvastatin  40 mg Oral Daily  . Chlorhexidine Gluconate Cloth  6 each Topical Daily  . guaiFENesin  600 mg Oral BID  . insulin aspart  0-15 Units Subcutaneous TID WC  . insulin aspart  7 Units Subcutaneous TID WC  . insulin detemir  35 Units Subcutaneous BID  . pantoprazole  40 mg Oral Daily  . polyethylene glycol  17 g Oral Daily  . [START ON 12/14/2018] predniSONE  40 mg Oral Q breakfast  . sacubitril-valsartan  1 tablet Oral BID  . senna-docusate  2 tablet Oral BID  . sodium chloride flush  10-40 mL  Intracatheter Q12H  . sodium chloride flush  3 mL Intravenous Q12H  . traMADol  50 mg Oral QHS  . warfarin  1 mg Oral ONCE-1800  . Warfarin - Pharmacist Dosing Inpatient   Does not apply q1800    Infusions: . sodium chloride      PRN Medications: sodium chloride, acetaminophen, guaiFENesin-dextromethorphan, HYDROmorphone (DILAUDID) injection, ondansetron (ZOFRAN) IV, oxyCODONE, sodium chloride flush, sodium chloride flush    Patient Profile   Raymond Pratt a 68 y/o morbidly obese WM,with positive PMH of chronic combined systolic and diastolic CHF/ [id number]) Echo: EF 20-25%, A-fib w/ RVR (2010 s/p failed cardioversion), now on amiodarone, chronic anticoagulation on coumadin w/ INRs followed by PCP, IDDM, HTN and OSAnoncompliant w/ CPAP who was lost to f/u in the [facility] and not seen since 2015, now presenting w/ acute on chronic systolic HF, for which AHF has been consulted, at the request of Dr. Nevada Crane, Internal Medicine.  Assessment/Plan   1. Acute on Chronic Systolic and Diastolic CHF: diagnosed in 2013. LHC 03/06/12 showed normal coronaries. Previous Echo in 2015 w/ EF at 15-20% w/ mod MR and moderately elevated PA pressure 54 mmHg. Presented 12/06/18 w/ 4 week history of progressive bilateral LEE and exertional and resting dyspnea. Volume overloaded on exam w/ 2+ bilateral LEE.BNP only 111 however suspect falsely low given morbid obesity. Chest x-ray demonstrated cardiomegaly with mild pulmonary vascular congestion. Repeat 2D echo 9/30 with persistently low LVEF but slightly improved from prior, now at 25-30% w/ diffuse hypokinesis and moderately elevated RVSP at 50.3 mmHg. - Had RHC with well compensated pressures  - Renal function improved. Restart torsemide 60 mg twice a day.  -Continue to hold ? blocker (Toprol XL). Can consider adding back as outpatient.  - Continue entresto 24-26 mg twice a day.  -Given concomitant T2DM, consider later addition of an SGLT2i (can add as  outpatient)  2. PAF: currently NSR. HR in the 90s w/ occasional PVCs -Continue amiodarone 200 mg daily -On coumadin. INR therapeutic today at 3.4  -Continue coumadin dosing per pharmacy  3. OSA: noncompliant w/ CPAP at home - refusing to use while inpatient   4. IDDM: poorly controlled. Hgb A1c 13.3 - getting insulin - management per IM  -Consider addition of an SGLT2i given concomitant systolic HF w/ EF < [id number]  5. AKI:  - Cr 1.4 -> 1.5 -> 2.1 -> 2.2->1.54 Renal function back to baseline.  Restart torsemide today. Check BMET in am.    6. Acute Gout Flare: h/o gout. Complains of bilateral hip and knee pain c/w prior attacks. Likely triggered  by aggressive diuresis w/ lasix. Uric acid elevated at 10.  - on steroids and colchicine per primary team    Length of Stay: Padre Ranchitos, NP  12/13/2018, 2:49 PM  Advanced Heart Failure Team Pager [phone] (M-F; 7a - 4p)  Please contact [facility] for night-coverage after hours (4p -7a ) and weekends on [email or web address]  Patient seen and examined with the above-signed Advanced Practice Provider and/or Housestaff. I personally reviewed laboratory data, imaging studies and relevant notes. I independently examined the patient and formulated the important aspects of the plan. I have edited the note to reflect any of my changes or salient points. I have personally discussed the plan with the patient and/or family.  Results of [place]bur reviewed with him. Volume status looks quite good. Diuretics have been on hold due to AKI. Renal function now improving. Will restart diuretics. Gout improved. Hopefully home tomorrow. Consider paramedicine f/u.   [name], MD  9:40 PM

## 2018-12-13 NOTE — Progress Notes (Signed)
[place] for Warfarin Indication: atrial fibrillation  Allergies  Allergen Reactions  . Actos [Pioglitazone] Other (See Comments)    Made the patient retain fluid    Patient Measurements: Height: 5\' 7"  (170.2 cm) Weight: (!) 322 lb 1.5 oz (146.1 kg) IBW/kg (Calculated) : 66.1  Vital Signs: Temp: 98.4 F (36.9 C) (10/07 0807) Temp Source: Oral (10/07 0807) BP: 108/64 (10/07 0807) Pulse Rate: 77 (10/07 0500)  Labs: Recent Labs    12/11/18 0500 12/12/18 0354 12/12/18 0838 12/13/18 0517  HGB  --   --  12.2*  11.9* 10.9*  HCT  --   --  36.0*  35.0* 35.2*  PLT  --   --   --  455*  LABPROT 25.6* 28.2*  --  33.6*  INR 2.4* 2.7*  --  3.4*  CREATININE 2.13* 2.27*  --  1.54*    Estimated Creatinine Clearance: 64.6 mL/min (A) (by C-G formula based on SCr of 1.54 mg/dL (H)).   Assessment: 68 y.o. male presenting with SOB. Pt continues on warfarin for history of afib. INR bump 3.4after some boost dose. No bleeding noted.  Amio 200mg  daily increased from PTA 100mg  daily PTA warfarin dose 7.5mg  TThSaSu, 5mg  MWF - will flip flop PTA dose after low dose tonight  Goal of Therapy:  INR 2-3 Monitor platelets by anticoagulation protocol: Yes   Plan:  Warfarin 1mg  x1  Daily INR Monitor CBC   Bonnita Nasuti Pharm.D. CPP, BCPS Clinical Pharmacist [phone] 12/13/2018 11:49 AM

## 2018-12-14 DIAGNOSIS — R0602 Shortness of breath: Secondary | ICD-10-CM

## 2018-12-14 LAB — BASIC METABOLIC PANEL
Anion gap: 11 (ref 5–15)
BUN: 63 mg/dL — ABNORMAL HIGH (ref 8–23)
CO2: 27 mmol/L (ref 22–32)
Calcium: 8.7 mg/dL — ABNORMAL LOW (ref 8.9–10.3)
Chloride: 98 mmol/L (ref 98–111)
Creatinine, Ser: 1.39 mg/dL — ABNORMAL HIGH (ref 0.61–1.24)
GFR calc Af Amer: >60 mL/min (ref >60)
GFR calc non Af Amer: 52 mL/min — ABNORMAL LOW (ref >60)
Glucose, Bld: 263 mg/dL — ABNORMAL HIGH (ref 70–99)
Potassium: 4.6 mmol/L (ref 3.5–5.1)
Sodium: 136 mmol/L (ref 135–145)

## 2018-12-14 LAB — COOXEMETRY PANEL
Carboxyhemoglobin: 1.4 % (ref 0.5–1.5)
Carboxyhemoglobin: 1.1 % (ref 0.5–1.5)
Methemoglobin: 0.9 % (ref 0.0–1.5)
Methemoglobin: 0.9 % (ref 0.0–1.5)
O2 Saturation: 99.2 %
O2 Saturation: 48.2 %
Total hemoglobin: 11 g/dL — ABNORMAL LOW (ref 12.0–16.0)
Total hemoglobin: 11.7 g/dL — ABNORMAL LOW (ref 12.0–16.0)

## 2018-12-14 LAB — GLUCOSE, CAPILLARY
Glucose-Capillary: 244 mg/dL — ABNORMAL HIGH (ref 70–99)
Glucose-Capillary: 269 mg/dL — ABNORMAL HIGH (ref 70–99)

## 2018-12-14 LAB — PROTIME-INR
INR: 3.3 — ABNORMAL HIGH (ref 0.8–1.2)
Prothrombin Time: 32.8 seconds — ABNORMAL HIGH (ref 11.4–15.2)

## 2018-12-14 MED ORDER — SENNOSIDES-DOCUSATE SODIUM 8.6-50 MG PO TABS: 2.0000 | ORAL_TABLET | Freq: Two times a day (BID) | ORAL | 0 refills | Status: DC

## 2018-12-14 MED ORDER — GUAIFENESIN ER 600 MG PO TB12: 600.0000 mg | ORAL_TABLET | Freq: Two times a day (BID) | ORAL | 0 refills | Status: DC

## 2018-12-14 MED ORDER — METOLAZONE 5 MG PO TABS: 5.0000 mg | ORAL_TABLET | ORAL | 2 refills | Status: DC

## 2018-12-14 MED ORDER — WARFARIN SODIUM 2.5 MG PO TABS
2.5000 mg | ORAL_TABLET | Freq: Once | ORAL | Status: AC
  Administered 2018-12-14: 2.5 mg via ORAL
  Filled 2018-12-14: qty 1

## 2018-12-14 MED ORDER — TORSEMIDE 20 MG PO TABS: 80.0000 mg | ORAL_TABLET | Freq: Two times a day (BID) | ORAL | 1 refills | Status: DC

## 2018-12-14 MED ORDER — PREDNISONE 20 MG PO TABS: 40.0000 mg | ORAL_TABLET | Freq: Every day | ORAL | 0 refills | Status: DC

## 2018-12-14 MED ORDER — POTASSIUM CHLORIDE CRYS ER 20 MEQ PO TBCR: 20.0000 meq | EXTENDED_RELEASE_TABLET | Freq: Two times a day (BID) | ORAL | 1 refills | Status: DC

## 2018-12-14 MED ORDER — WARFARIN SODIUM 5 MG PO TABS: ORAL_TABLET | ORAL | Status: AC

## 2018-12-14 MED ORDER — NOVOLIN 70/30 FLEXPEN RELION (70-30) 100 UNIT/ML ~~LOC~~ SUPN: 40.0000 [IU] | PEN_INJECTOR | SUBCUTANEOUS | 11 refills | Status: AC

## 2018-12-14 MED ORDER — AMIODARONE HCL 200 MG PO TABS: 200.0000 mg | ORAL_TABLET | Freq: Every day | ORAL | 0 refills | Status: DC

## 2018-12-14 MED ORDER — TORSEMIDE 20 MG PO TABS: 80.0000 mg | ORAL_TABLET | Freq: Two times a day (BID) | ORAL | Status: DC

## 2018-12-14 MED ORDER — SACUBITRIL-VALSARTAN 24-26 MG PO TABS: 1.0000 | ORAL_TABLET | Freq: Two times a day (BID) | ORAL | 1 refills | Status: DC

## 2018-12-14 MED ORDER — DIGOXIN 250 MCG PO TABS: 0.1250 mg | ORAL_TABLET | Freq: Every day | ORAL | 1 refills | Status: DC

## 2018-12-14 MED ORDER — WARFARIN SODIUM 2.5 MG PO TABS: 2.5000 mg | ORAL_TABLET | Freq: Once | ORAL | Status: DC

## 2018-12-14 MED FILL — AMIODARONE HCL 200 MG TAB: 200 | 30 days supply | Qty: 30 | Fill #0

## 2018-12-14 MED FILL — SENNA S 8.6-50 MG TABS: 8.6-50 | 7 days supply | Qty: 30 | Fill #0

## 2018-12-14 MED FILL — predniSONE 20 MG TABS: 20 | 2 days supply | Qty: 4 | Fill #0

## 2018-12-14 MED FILL — TORSEMIDE 20 MG TABLET: 20 | 7 days supply | Qty: 60 | Fill #0

## 2018-12-14 MED FILL — NOVOLIN 70/30 FLEXPEN (70-3: (70-30) 100 | 18 days supply | Qty: 15 | Fill #0

## 2018-12-14 MED FILL — metOLazone 5 MG TABS: 5 | 30 days supply | Qty: 15 | Fill #0

## 2018-12-14 MED FILL — MUCUS RELIEF 600 MG TB12: 600 | 15 days supply | Qty: 30 | Fill #0

## 2018-12-14 MED FILL — ENTRESTO 24 MG-26 MG TABLET: 24-26 | 30 days supply | Qty: 60 | Fill #0

## 2018-12-14 NOTE — Progress Notes (Signed)
D/C instructions given to patient. Medications discussed at length. Medications delivered to pt at bedside. Daughter to escort pt home.  Clyde Canterbury, RN

## 2018-12-14 NOTE — Progress Notes (Signed)
Inpatient Diabetes Program Recommendations  AACE/ADA: New Consensus Statement on Inpatient Glycemic Control (2015)  Target Ranges:  Prepandial:   less than 140 mg/dL      Peak postprandial:   less than 180 mg/dL (1-2 hours)      Critically ill patients:  140 - 180 mg/dL   Lab Results  Component Value Date   GLUCAP 244 (H) 12/14/2018   HGBA1C 13.3 (H) 12/06/2018    Review of Glycemic Control Results for Raymond Pratt, Raymond Pratt (MRN [id number]) as of 12/14/2018 10:06  Ref. Range 12/13/2018 16:37 12/13/2018 20:49 12/14/2018 05:57  Glucose-Capillary Latest Ref Range: 70 - 99 mg/dL 309 (H) 317 (H) 244 (H)   Diabetes history: Type 2 DM Outpatient Diabetes medications: Novolin 70/30 65 units QAM, Novolin R 20 units with lunch and dinner Current orders for Inpatient glycemic control: Levemir 35 units BID, Novolog 7 units TID, Novolog 0-15 units TID Prednisone 40 mg QD  Inpatient Diabetes Program Recommendations:    In the event patient is to be discharged, consider Novolin 70/30 40 units BID and Novolin Regular 10 units with lunch.  Information regarding Relion insulin at [facility] has been attached to DC summary.  Per previous DM coordinator note from 10/2, "Secure chat sent to MD to discuss 70/30 at discharge, patient struggling with insulin absorption and being in doughnut hole. "  Thanks, Bronson Curb, MSN, RNC-OB Diabetes Coordinator [phone] (8a-5p)

## 2018-12-14 NOTE — Progress Notes (Signed)
PICC line removed per order.  WNL.  Cleaned with CHG, covered with vaseline guaze and dry 2x2.  No bleeding noted.  Verbalizes understanding of dressing care, interventions if bleeding noted and s&sx of infection via teachback method.

## 2018-12-14 NOTE — TOC Transition Note (Addendum)
Transition of Care [facility]) - CM/SW Discharge Note Marvetta Gibbons RN, BSN Transitions of Care Unit 4E- RN Case Manager [phone]   Patient Details  Name: Luna Colella MRN: [id number] Date of Birth: [birth year]  Transition of Care [facility]) CM/SW Contact:  Dawayne Patricia, RN Phone Number: 12/14/2018, 2:31 PM   Clinical Narrative:    Pt stable for transition home today, order for [facility] has been placed. CM spoke with pt at bedside- discussed recommendation for STSNF for rehab- pt reports that he is getting around better and no longer feels like he needs rehab. Discussed HHRN and pt also declines HH stating that he understands his medications and does not feel like a nurse visit would be helpful. Pt states his daughter and granddaughter are nearby to assist. He has a cane and walker at home. No difficulty with medications. Has transport home. No HH referral made per pt choice to decline at this time- list provided Per CMS guidelines from medicare.gov website with star ratings (copy placed in shadow chart)- should pt change is mind at home, he can f/u with PCP for [facility] needs.    Final next level of care: Home/Self Care Barriers to Discharge: No Barriers Identified   Patient Goals and CMS Choice Patient states their goals for this hospitalization and ongoing recovery are:: return home CMS Medicare.gov Compare Post Acute Care list provided to:: Patient Choice offered to / list presented to : Patient  Discharge Placement             Home          Discharge Plan and Services   Discharge Planning Services: CM Consult Post Acute Care Choice: Home Health          DME Arranged: N/A DME Agency: NA       HH Arranged: RN, Disease Management, Patient Refused HH          Social Determinants of Health (SDOH) Interventions     Readmission Risk Interventions No flowsheet data found.

## 2018-12-14 NOTE — Discharge Summary (Signed)
Physician Discharge Summary  Raymond Pratt [id number] DOB: 1950-04-16 DOA: 12/05/2018  PCP: Shon Baton, MD  Admit date: 12/05/2018 Discharge date: 12/14/2018  Admitted From: Home  Disposition: home  Recommendations for Outpatient Follow-up:  1. Follow up with PCP in 1-2 weeks 2. Please obtain BMP/CBC in one week 3. Follow up with heart failure clinic for further care 4. Follow up with PCP for further adjustment of Diabetic regimen.   Home Health: yes, nurse  Discharge Condition: stable.  CODE STATUS: full code Diet recommendation: Heart Healthy  Brief/Interim Summary: 68 year old with past medical history significant for nonischemic cardiomyopathy with ejection fraction of 15 to 20% on echo on 2015, A. fib on Coumadin, hypertension, type 2 diabetes, OSA on CPAP who presented with concerns of worsening shortness of breath.  Patient reports that he has had gradual worsening of his shortness of breath for the past several weeks.  He was able to lay flat about 3 weeks ago but has had to prop himself up with more pillows due to shortness of breath at night.  He also noted worsening lower extremity edema.  He also report abdominal bloating.  He has been taking torsemide daily .  Been also taking metolazone but he stopped it a month ago.  Admitted with acute on chronic heart failure exacerbation and gout flare.  1-acute on chronic systolic heart failure exacerbation with volume overload, medication noncompliance 2D echo show ejection fraction 25 to 30% on 11/19/2018 -11 L Heart cath performed on 10/6 pressure stable. Plan was to resume oral diuretics today. Appreciate heart failure team following he will be discharge on digoxin, spironolactone, entresto, torsemide 80 mg BID> metolazone weekly.   Worsening AKI on chronic kidney disease stage III: Creatinine baseline 1.3 Creatinine increased to 2.2 with diuretics. Holding diuretics.  Creatinine trending down today at 1.5 -improved.    Mild hyperkalemia   Acute severe multiple joint pain likely gout flare: Uric acid 10.3. Continue with colchicine will change Solu-Medrol to prednisone his pain has improved. Resume allopurinol.  Prednisone taper over 3 days.   hypotension related to [facility] improved stable  Acute hypoxic respiratory failure: Related to pulmonary edema secondary to heart failure   Diabetes type 2: resume 70/30 at discharge 40 Units BID. Needs further adjustment.   Paroxysmal A. fib on Coumadin Heart failure team following On amiodarone Iron deficiency anemia received IV Feraheme   Discharge Diagnoses:  Active Problems:   Type 2 diabetes mellitus with hyperlipidemia (HCC)   OSA on CPAP   Anticoagulated on Coumadin   CHF (congestive heart failure) [facility])    Discharge Instructions  Discharge Instructions    Amb Referral to Nutrition and Diabetic E   Complete by: As directed    Diet - low sodium heart healthy   Complete by: As directed    Increase activity slowly   Complete by: As directed      Allergies as of 12/14/2018      Reactions   Actos [pioglitazone] Other (See Comments)   Made the patient retain fluid      Medication List    STOP taking these medications   aspirin 81 MG tablet   Cinnamon 500 MG Tabs   cyclobenzaprine 5 MG tablet Commonly known as: Flexeril   HYDROcodone-acetaminophen 5-325 MG tablet Commonly known as: NORCO/VICODIN   metoprolol succinate 50 MG 24 hr tablet Commonly known as: TOPROL-XL   simvastatin 20 MG tablet Commonly known as: ZOCOR     TAKE these medications   acetaminophen 325  MG tablet Commonly known as: TYLENOL Take 650 mg by mouth every 6 (six) hours as needed for mild pain.   allopurinol 300 MG tablet Commonly known as: ZYLOPRIM Take 300 mg by mouth daily. Notes to patient: Take tomorrow morning (10/9).   amiodarone 200 MG tablet Commonly known as: PACERONE Take 1 tablet (200 mg total) by mouth daily. Start taking  on: December 15, 2018 What changed:   how much to take  how to take this  when to take this  additional instructions Notes to patient: Take tomorrow morning (10/9).   atorvastatin 40 MG tablet Commonly known as: LIPITOR Take 40 mg by mouth daily. Notes to patient: Take tomorrow morning (10/9).   digoxin 0.25 MG tablet Commonly known as: LANOXIN Take 0.5 tablets (0.125 mg total) by mouth daily. Notes to patient: Take tomorrow morning (10/9).   guaiFENesin 600 MG 12 hr tablet Commonly known as: MUCINEX Take 1 tablet (600 mg total) by mouth 2 (two) times daily. Notes to patient: Take this evening (10/8).   metolazone 5 MG tablet Commonly known as: Zaroxolyn Take 1 tablet (5 mg total) by mouth once a week. On Wednesday. What changed: additional instructions Notes to patient: Take as you were at home.   MULTIVITAMIN PO Take 1 tablet by mouth daily. Notes to patient: Take as you were at home.   NovoLIN 70/30 FlexPen Relion (70-30) 100 UNIT/ML PEN Generic drug: Insulin Isophane & Regular Human Inject 40 Units into the skin See admin instructions. Inject 40 units twice a day., What changed:   how much to take  additional instructions Notes to patient: Take as you were at home.   potassium chloride SA 20 MEQ tablet Commonly known as: KLOR-CON Take 1 tablet (20 mEq total) by mouth 2 (two) times daily. Take an extra tab on Mondays with metolazone What changed: when to take this   predniSONE 20 MG tablet Commonly known as: DELTASONE Take 2 tablets (40 mg total) by mouth daily with breakfast. Start taking on: December 15, 2018 Notes to patient: Take tomorrow morning (10/9) with breakfast.   sacubitril-valsartan 24-26 MG Commonly known as: ENTRESTO Take 1 tablet by mouth 2 (two) times daily. Notes to patient: Take this evening (10/8).   senna-docusate 8.6-50 MG tablet Commonly known as: Senokot-S Take 2 tablets by mouth 2 (two) times daily. Notes to patient: Take this  evening (10/8).   spironolactone 25 MG tablet Commonly known as: ALDACTONE Take 1 tablet (25 mg total) by mouth daily. Notes to patient: Take tomorrow morning (10/9).   torsemide 20 MG tablet Commonly known as: DEMADEX Take 4 tablets (80 mg total) by mouth 2 (two) times daily. What changed:   how much to take  when to take this Notes to patient: Take this evening (10/8).   traMADol 50 MG tablet Commonly known as: ULTRAM Take 50 mg by mouth at bedtime. Notes to patient: Take this evening (10/8) at bedtime.   warfarin 5 MG tablet Commonly known as: COUMADIN Take 5 mg daily Start taking on: December 15, 2018 What changed:   how much to take  how to take this  when to take this  additional instructions Notes to patient: Take tomorrow evening (10/9).      Follow-up Information    [facility] Follow up on 12/26/2018.   Specialty: Cardiology Why: at [place] information: [address] [id number] [place] [phone]  Shon Baton, MD Follow up.   Specialty: Internal Medicine Why: need INR check in 2 - 3 days.  Contact information: [place] [address] [phone]        Bensimhon, Shaune Pascal, MD .   Specialty: Cardiology Contact information: [place] [address] [phone]          Allergies  Allergen Reactions  . Actos [Pioglitazone] Other (See Comments)    Made the patient retain fluid    Consultations: Cardiology  Procedures/Studies: Dg Chest 2 View  Result Date: 12/05/2018 CLINICAL DATA:  Shortness of breath for 1 day EXAM: CHEST - 2 VIEW COMPARISON:  10/23/2014 FINDINGS: Mild bilateral interstitial thickening. No pleural effusion, focal consolidation or pneumothorax. Stable cardiomegaly. No acute osseous abnormality. IMPRESSION: 1. Cardiomegaly with mild pulmonary vascular congestion.  Electronically Signed   By: Kathreen Devoid   On: 12/05/2018 14:27   [place] Ekg Site Rite  Result Date: 12/07/2018 If Site Rite image not attached, placement could not be confirmed due to current cardiac rhythm.    Subjective: Feeling better  Discharge Exam: Vitals:   12/14/18 0755 12/14/18 1200  BP: 96/64 129/76  Pulse:    Resp: 18 18  Temp: 98.1 F (36.7 C) 97.6 F (36.4 C)  SpO2: 95% 96%     General: Pt is alert, awake, not in acute distress Cardiovascular: RRR, S1/S2 +, no rubs, no gallops Respiratory: CTA bilaterally, no wheezing, no rhonchi Abdominal: Soft, NT, ND, bowel sounds + Extremities: no edema, no cyanosis    The results of significant diagnostics from this hospitalization (including imaging, microbiology, ancillary and laboratory) are listed below for reference.     Microbiology: Recent Results (from the past 240 hour(s))  SARS CORONAVIRUS 2 (TAT 6-24 HRS) Nasopharyngeal Nasopharyngeal Swab     Status: None   Collection Time: 12/05/18  6:25 PM   Specimen: Nasopharyngeal Swab  Result Value Ref Range Status   SARS Coronavirus 2 NEGATIVE NEGATIVE Final    Comment: (NOTE) SARS-CoV-2 target nucleic acids are NOT DETECTED. The SARS-CoV-2 RNA is generally detectable in upper and lower respiratory specimens during the acute phase of infection. Negative results do not preclude SARS-CoV-2 infection, do not rule out co-infections with other pathogens, and should not be used as the sole basis for treatment or other patient management decisions. Negative results must be combined with clinical observations, patient history, and epidemiological information. The expected result is Negative. Fact Sheet for Patients: SugarRoll.be Fact Sheet for Healthcare Providers: https://www.woods-mathews.com/ This test is not yet approved or cleared by the [place] FDA and  has been authorized for detection and/or diagnosis of SARS-CoV-2  by FDA under an Emergency Use Authorization (EUA). This EUA will remain  in effect (meaning this test can be used) for the duration of the COVID-19 declaration under Section 56 4(b)(1) of the Act, 21 U.S.C. section 360bbb-3(b)(1), unless the authorization is terminated or revoked sooner. Performed at [facility], [place] [address] [place], [place] [address]      Labs: BNP (last 3 results) Recent Labs    12/05/18 1825  BNP [id number]*   Basic Metabolic Panel: Recent Labs  Lab 12/10/18 0525 12/11/18 0500 12/12/18 0354 12/12/18 0838 12/13/18 0517 12/14/18 0500  NA 131* 132* 132* 135  136 133* 136  K 4.6 5.1 4.8 4.8  4.7 5.0 4.6  CL 89* 91* 95*  --  96* 98  CO2 29 28 27   --  25 27  GLUCOSE  241* 222* 321*  --  338* 263*  BUN 33* 46* 72*  --  72* 63*  CREATININE 1.55* 2.13* 2.27*  --  1.54* 1.39*  CALCIUM 8.7* 8.9 8.5*  --  8.5* 8.7*   Liver Function Tests: No results for input(s): AST, ALT, ALKPHOS, BILITOT, PROT, ALBUMIN in the last 168 hours. No results for input(s): LIPASE, AMYLASE in the last 168 hours. No results for input(s): AMMONIA in the last 168 hours. CBC: Recent Labs  Lab 12/12/18 0838 12/13/18 0517  WBC  --  11.5*  HGB 12.2*  11.9* 10.9*  HCT 36.0*  35.0* 35.2*  MCV  --  87.3  PLT  --  455*   Cardiac Enzymes: No results for input(s): CKTOTAL, CKMB, CKMBINDEX, TROPONINI in the last 168 hours. BNP: Invalid input(s): POCBNP CBG: Recent Labs  Lab 12/13/18 1149 12/13/18 1637 12/13/18 2049 12/14/18 0557 12/14/18 1106  GLUCAP 354* 309* 317* 244* 269*   D-Dimer No results for input(s): DDIMER in the last 72 hours. Hgb A1c No results for input(s): HGBA1C in the last 72 hours. Lipid Profile No results for input(s): CHOL, HDL, LDLCALC, TRIG, CHOLHDL, LDLDIRECT in the last 72 hours. Thyroid function studies No results for input(s): TSH, T4TOTAL, T3FREE, THYROIDAB in the last 72 hours.  Invalid input(s): FREET3 Anemia work up No results for  input(s): VITAMINB12, FOLATE, FERRITIN, TIBC, IRON, RETICCTPCT in the last 72 hours. Urinalysis    Component Value Date/Time   COLORURINE YELLOW 01/04/2014 [place] 01/04/2014 0955   LABSPEC 1.014 01/04/2014 0955   PHURINE 7.5 01/04/2014 0955   GLUCOSEU NEGATIVE 01/04/2014 0955   HGBUR NEGATIVE 01/04/2014 0955   HGBUR trace-lysed 08/04/2008 1450   BILIRUBINUR NEGATIVE 01/04/2014 [place] 01/04/2014 0955   PROTEINUR NEGATIVE 01/04/2014 0955   UROBILINOGEN 1.0 01/04/2014 0955   NITRITE NEGATIVE 01/04/2014 0955   LEUKOCYTESUR NEGATIVE 01/04/2014 0955   Sepsis Labs Invalid input(s): PROCALCITONIN,  WBC,  LACTICIDVEN Microbiology Recent Results (from the past 240 hour(s))  SARS CORONAVIRUS 2 (TAT 6-24 HRS) Nasopharyngeal Nasopharyngeal Swab     Status: None   Collection Time: 12/05/18  6:25 PM   Specimen: Nasopharyngeal Swab  Result Value Ref Range Status   SARS Coronavirus 2 NEGATIVE NEGATIVE Final    Comment: (NOTE) SARS-CoV-2 target nucleic acids are NOT DETECTED. The SARS-CoV-2 RNA is generally detectable in upper and lower respiratory specimens during the acute phase of infection. Negative results do not preclude SARS-CoV-2 infection, do not rule out co-infections with other pathogens, and should not be used as the sole basis for treatment or other patient management decisions. Negative results must be combined with clinical observations, patient history, and epidemiological information. The expected result is Negative. Fact Sheet for Patients: SugarRoll.be Fact Sheet for Healthcare Providers: https://www.woods-mathews.com/ This test is not yet approved or cleared by the [place] FDA and  has been authorized for detection and/or diagnosis of SARS-CoV-2 by FDA under an Emergency Use Authorization (EUA). This EUA will remain  in effect (meaning this test can be used) for the duration of  the COVID-19 declaration under Section 56 4(b)(1) of the Act, 21 U.S.C. section 360bbb-3(b)(1), unless the authorization is terminated or revoked sooner. Performed at [facility], [place] [address] [place], [place] [address]      Time coordinating discharge: 40 minutes  SIGNED:   Elmarie Shiley, MD  [facility]

## 2018-12-14 NOTE — Progress Notes (Signed)
[place] for Warfarin Indication: atrial fibrillation  Allergies  Allergen Reactions  . Actos [Pioglitazone] Other (See Comments)    Made the patient retain fluid    Patient Measurements: Height: 5\' 7"  (170.2 cm) Weight: (!) 316 lb 8 oz (143.6 kg) IBW/kg (Calculated) : 66.1  Vital Signs: Temp: 97.9 F (36.6 C) (10/08 0611) Temp Source: Oral (10/08 0611) BP: 118/75 (10/08 0611) Pulse Rate: 69 (10/07 2352)  Labs: Recent Labs    12/12/18 0354 12/12/18 0838 12/13/18 0517 12/14/18 0500  HGB  --  12.2*  11.9* 10.9*  --   HCT  --  36.0*  35.0* 35.2*  --   PLT  --   --  455*  --   LABPROT 28.2*  --  33.6* 32.8*  INR 2.7*  --  3.4* 3.3*  CREATININE 2.27*  --  1.54* 1.39*    Estimated Creatinine Clearance: 70.8 mL/min (A) (by C-G formula based on SCr of 1.39 mg/dL (H)).   Assessment: 68 y.o. male presenting with SOB. Pt continues on warfarin for history of afib. INR bump 3.4 > 3.3 after some boost dose. No bleeding noted.  Amio 200mg  daily increased from PTA 100mg  daily PTA warfarin dose 7.5mg  TThSaSu, 5mg  MWF - may need to flip flop PTA dose after low dose tonight  Goal of Therapy:  INR 2-3 Monitor platelets by anticoagulation protocol: Yes   Plan:  Warfarin 2.5 mg x1  At discharge I would recommend 2.5 mg tonight, then 5 mg daily for now with INR recheck early next week. Daily INR Monitor CBC   Marguerite Olea, [facility] Clinical Pharmacist Phone [phone]  12/14/2018 7:34 AM

## 2018-12-14 NOTE — Care Management Important Message (Signed)
Important Message  Patient Details  Name: Raymond Pratt MRN: [id number] Date of Birth: Feb 17, 1951   Medicare Important Message Given:  Yes     [name] 12/14/2018, 1:54 PM

## 2018-12-14 NOTE — Consult Note (Addendum)
   [facility] CM Inpatient Consult   12/14/2018  Raymond Pratt 1950/11/07 [id number]   Follow up:  2:35 Pm  Return call received from patient, HIPAA verified.  Verbally consent to post hospital follow up. Explained [facility] services and to expect a call.  Consents to [place] to have a Advertising account executive and to leave a contact name, company and phone number.   Patient followed for disposition and needs. Attempts to reach out to patient via phone but no answer.  Left a generic voicemail requesting a return call.  Patient is for post hospital follow up.  Notes for difficulty with affording medications per Diabetes Coordinator.  Attempts to call patient's listed contact with no answer as well.    Patient to be followed by [place] Coordinator.  For questions, please contact:  Natividad Brood, RN BSN [place] [facility] Liaison  [phone] business mobile phone Toll free office [phone]  Fax number: [phone] [email or web address]

## 2018-12-14 NOTE — Progress Notes (Addendum)
Advanced Heart Failure Rounding Note  PCP-Cardiologist: [name], MD   Subjective:   Responding well to PO diuretics. -3.5L out yesterday. Creatinine trending down 1.39.   Feels much better. No longer w/ supplemental O2 requirements. Gout pain resolved.   Co-ox reported at 99% this am. Doubt accurate. Will repeat.   RHC  RA = 10 RV = 55/8 PA = 54/15 (27) PCW = 16 Fick cardiac output/index = 7.0/2.8 Thermo CO/CI = 7.33/2.9 PVR = 1.5 Ao sat = 95% PA sat = 65%, 64% Assessment: 1. Relatively well compensated pressures with mild pulmonary HTN in setting of elevated cardiac output   Objective:   Weight Range: (!) 143.6 kg Body mass index is 49.57 kg/m.   Vital Signs:   Temp:  [97.9 F (36.6 C)-98.4 F (36.9 C)] 97.9 F (36.6 C) (10/08 0611) Pulse Rate:  [69-117] 69 (10/07 2352) Resp:  [16-23] 18 (10/08 0611) BP: (97-139)/(56-75) 118/75 (10/08 0611) SpO2:  [91 %-95 %] 91 % (10/07 2352) Weight:  [143.6 kg] 143.6 kg (10/08 0611) Last BM Date: 12/10/18  Weight change: Filed Weights   12/12/18 0253 12/13/18 0500 12/14/18 0611  Weight: (!) 147.6 kg (!) 146.1 kg (!) 143.6 kg    Intake/Output:   Intake/Output Summary (Last 24 hours) at 12/14/2018 0705 Last data filed at 12/14/2018 0500 Gross per 24 hour  Intake 600 ml  Output 3561 ml  Net -2961 ml      Physical Exam   CVP 11 PHYSICAL EXAM: General:  Morbidly obese middle aged WM. Well appearing. No respiratory difficulty HEENT: normal Neck: supple. Thick neck, difficult to assess JVD. Carotids 2+ bilat; no bruits. No lymphadenopathy or thyromegaly appreciated. Cor: PMI nondisplaced. Regular rate & rhythm. No rubs, gallops or murmurs. Lungs: clear Abdomen: obese, soft, nontender, nondistended. No hepatosplenomegaly. No bruits or masses. Good bowel sounds. Extremities: trace edema bilaterally w/ chronic venous statis dermatitis  Neuro: alert & oriented x 3, cranial nerves grossly intact. moves all 4  extremities w/o difficulty. Affect pleasant.   Telemetry   NSR w/ occasional PVCs. Mid 80s   EKG    No new EKG to review today  Labs    CBC Recent Labs    12/12/18 0838 12/13/18 0517  WBC  --  11.5*  HGB 12.2*  11.9* 10.9*  HCT 36.0*  35.0* 35.2*  MCV  --  87.3  PLT  --  [id number]*   Basic Metabolic Panel Recent Labs    12/13/18 0517 12/14/18 0500  NA 133* 136  K 5.0 4.6  CL 96* 98  CO2 25 27  GLUCOSE 338* 263*  BUN 72* 63*  CREATININE 1.54* 1.39*  CALCIUM 8.5* 8.7*   Liver Function Tests No results for input(s): AST, ALT, ALKPHOS, BILITOT, PROT, ALBUMIN in the last 72 hours. No results for input(s): LIPASE, AMYLASE in the last 72 hours. Cardiac Enzymes No results for input(s): CKTOTAL, CKMB, CKMBINDEX, TROPONINI in the last 72 hours.  BNP: BNP (last 3 results) Recent Labs    12/05/18 1825  BNP 111.3*    ProBNP (last 3 results) No results for input(s): PROBNP in the last 8760 hours.   D-Dimer No results for input(s): DDIMER in the last 72 hours. Hemoglobin A1C No results for input(s): HGBA1C in the last 72 hours. Fasting Lipid Panel No results for input(s): CHOL, HDL, LDLCALC, TRIG, CHOLHDL, LDLDIRECT in the last 72 hours. Thyroid Function Tests No results for input(s): TSH, T4TOTAL, T3FREE, THYROIDAB in the last 72 hours.  Invalid input(s): FREET3  Other results:   Imaging    No results found.   Medications:     Scheduled Medications: . allopurinol  300 mg Oral Daily  . amiodarone  200 mg Oral Daily  . atorvastatin  40 mg Oral Daily  . Chlorhexidine Gluconate Cloth  6 each Topical Daily  . guaiFENesin  600 mg Oral BID  . insulin aspart  0-15 Units Subcutaneous TID WC  . insulin aspart  7 Units Subcutaneous TID WC  . insulin detemir  35 Units Subcutaneous BID  . pantoprazole  40 mg Oral Daily  . polyethylene glycol  17 g Oral Daily  . predniSONE  40 mg Oral Q breakfast  . sacubitril-valsartan  1 tablet Oral BID  . senna-docusate   2 tablet Oral BID  . sodium chloride flush  10-40 mL Intracatheter Q12H  . sodium chloride flush  3 mL Intravenous Q12H  . torsemide  60 mg Oral BID  . traMADol  50 mg Oral QHS  . Warfarin - Pharmacist Dosing Inpatient   Does not apply q1800    Infusions: . sodium chloride      PRN Medications: sodium chloride, acetaminophen, guaiFENesin-dextromethorphan, HYDROmorphone (DILAUDID) injection, ondansetron (ZOFRAN) IV, oxyCODONE, sodium chloride flush, sodium chloride flush    Patient Profile   [name] a 68 y/o morbidly obese WM,with positive PMH of chronic combined systolic and diastolic CHF/ [id number]) Echo: EF 20-25%, A-fib w/ RVR (2010 s/p failed cardioversion), now on amiodarone, chronic anticoagulation on coumadin w/ INRs followed by PCP, IDDM, HTN and OSAnoncompliant w/ CPAP who was lost to f/u in the [facility] and not seen since 2015, now presenting w/ acute on chronic systolic HF, for which AHF has been consulted, at the request of Dr. Nevada Crane, Internal Medicine.  Assessment/Plan   1. Acute on Chronic Systolic and Diastolic CHF: diagnosed in 2013. LHC 03/06/12 showed normal coronaries. Previous Echo in 2015 w/ EF at 15-20% w/ mod MR and moderately elevated PA pressure 54 mmHg. Presented 12/06/18 w/ 4 week history of progressive bilateral LEE and exertional and resting dyspnea. Volume overloaded on exam w/ 2+ bilateral LEE.BNP only 111 however suspect falsely low given morbid obesity. Chest x-ray demonstrated cardiomegaly with mild pulmonary vascular congestion. Repeat 2D echo 9/30 with persistently low LVEF but slightly improved from prior, now at 25-30% w/ diffuse hypokinesis and moderately elevated RVSP at 50.3 mmHg. - Had RHC with well compensated pressures  - Renal function improved. Restarted torsemide 60 mg twice a day yesterday. - Responding well to PO diuretics. -3.5L out yesterday. Net I/o - 15L since admit. CVP 11. Wt down to 316 lb. SCr stable at 1.3.  -Continue to  hold ? blocker (Toprol XL). Can consider adding back as outpatient.  - Continue entresto 24-26 mg twice a day (lisinopril discontinued on admit).  -Given concomitant T2DM, consider later addition of an SGLT2i (can add as outpatient) -Likely d/c home today. Would benefit from paramedicine.  -Recommend low sodium diet and daily weights upon return home.  -F/u in [facility] in 1 week.   2. PAF: currently NSR. HR in the 80s w/ occasional PVCs -Continue amiodarone 200 mg daily -Plan to resume  blocker at outpatient f/u  -On coumadin. INR therapeutic today at 3.3  -Continue coumadin dosing per pharmacy  3. OSA: noncompliant w/ CPAP at home - refusing to use while inpatient   4. IDDM: poorly controlled. Hgb A1c 13.3 - getting insulin - management per IM  -Consider addition of an SGLT2i given  concomitant systolic HF w/ EF < [id number]  5. AKI:  - Cr 1.4 -> 1.5 -> 2.1 -> 2.2->1.54->1.39 Renal function back to baseline.  -d/c home on torsemide 60 bid .   6. Acute Gout Flare:  Uric acid elevated at 10 this admit.  -treated w/ steroids and colchicine per primary team -improved.    Length of Stay: [address] PA-C  12/14/2018, 7:05 AM  Advanced Heart Failure Team Pager [phone] (M-F; 7a - 4p)  Please contact [facility] for night-coverage after hours (4p -7a ) and weekends on [email or web address]  Patient seen and examined with the above-signed Advanced Practice Provider and/or Housestaff. I personally reviewed laboratory data, imaging studies and relevant notes. I independently examined the patient and formulated the important aspects of the plan. I have edited the note to reflect any of my changes or salient points. I have personally discussed the plan with the patient and/or family.  He feels good. Anxious to go home. Repeat co-ox 48% which is low but I think this is as good as we can get him  Can go home today from our standpoint  HF meds  Torsemide 80 bid (up from 60 bid) Kcl 20  Bid  (up from 20 daily) Entresto 24/26 (replaces lisinopril) Digoxin 0.125 Arlyce Harman 25 daily Amio 200 daily  Metolazone 5mg  every Wednesday Warfarin  Stop ASA with warfarin  Hold metoprolol for now. (can restart as outpatient)  We will arrange f/u in HF Clinic. Also need f/u with Dr. Virgina Jock. Labs one week.  [name], MD  9:33 AM

## 2018-12-15 ENCOUNTER — Other Ambulatory Visit: Payer: Self-pay

## 2018-12-15 NOTE — Patient Outreach (Addendum)
[facility]) Care Management  12/15/2018  Truong Milham Jul 12, 1950 [id number]   Transition of Care Referral  Referral Date: 12/06/2018 Referral Source: Inpatient CM Date of Admission: 12/05/2018 Diagnosis:SOB Date of Discharge: Pending Facility: [place]: [employer] *PCP Office Does [facility]*    Outreach attempt #1 to patient. No answer. RN CM left HIPAA compliant voicemail message along with contact info. Per referral message patient also requested that Benjamine Mola be contacted to try to reach pt at that number. RN CM made attempt but no answer. Voicemail message left on that phone as well.   Plan: RN CM will make outreach attempt to patient within 3-4 business days. RN CM will send unsuccessful outreach letter to patient.  Enzo Montgomery, RN,BSN,CCM [place] Management Telephonic Care Management Coordinator Direct Phone: [phone] Toll Free: [phone] Fax: [phone]

## 2018-12-18 ENCOUNTER — Telehealth (HOSPITAL_COMMUNITY): Payer: Self-pay | Admitting: Pharmacist

## 2018-12-18 DIAGNOSIS — J9601 Acute respiratory failure with hypoxia: Secondary | ICD-10-CM | POA: Diagnosis not present

## 2018-12-18 DIAGNOSIS — I130 Hypertensive heart and chronic kidney disease with heart failure and stage 1 through stage 4 chronic kidney disease, or unspecified chronic kidney disease: Secondary | ICD-10-CM | POA: Diagnosis not present

## 2018-12-18 DIAGNOSIS — M109 Gout, unspecified: Secondary | ICD-10-CM | POA: Diagnosis not present

## 2018-12-18 DIAGNOSIS — E1122 Type 2 diabetes mellitus with diabetic chronic kidney disease: Secondary | ICD-10-CM | POA: Diagnosis not present

## 2018-12-18 DIAGNOSIS — Z794 Long term (current) use of insulin: Secondary | ICD-10-CM | POA: Diagnosis not present

## 2018-12-18 DIAGNOSIS — E1165 Type 2 diabetes mellitus with hyperglycemia: Secondary | ICD-10-CM | POA: Diagnosis not present

## 2018-12-18 DIAGNOSIS — N1832 Chronic kidney disease, stage 3b: Secondary | ICD-10-CM | POA: Diagnosis not present

## 2018-12-18 DIAGNOSIS — D649 Anemia, unspecified: Secondary | ICD-10-CM | POA: Diagnosis not present

## 2018-12-18 DIAGNOSIS — I4891 Unspecified atrial fibrillation: Secondary | ICD-10-CM | POA: Diagnosis not present

## 2018-12-18 DIAGNOSIS — N179 Acute kidney failure, unspecified: Secondary | ICD-10-CM | POA: Diagnosis not present

## 2018-12-18 DIAGNOSIS — I959 Hypotension, unspecified: Secondary | ICD-10-CM | POA: Diagnosis not present

## 2018-12-18 DIAGNOSIS — E875 Hyperkalemia: Secondary | ICD-10-CM | POA: Diagnosis not present

## 2018-12-18 DIAGNOSIS — I5023 Acute on chronic systolic (congestive) heart failure: Secondary | ICD-10-CM | POA: Diagnosis not present

## 2018-12-18 NOTE — Telephone Encounter (Signed)
Sent in Manufacturer's Assistance application to [employer] for Entresto.   Phone:[phone] Fax: [phone]  Application pending, will continue to follow.  Viney Acocella, PharmD, BCPS, CPP Heart Failure Clinic Pharmacist [phone]  

## 2018-12-19 ENCOUNTER — Other Ambulatory Visit: Payer: Self-pay

## 2018-12-19 NOTE — Patient Outreach (Signed)
[facility]) Care Management  12/19/2018  Raymond Pratt 1950/05/29 [id number]   Transition of Care Referral  Referral Date:12/06/2018 Referral Source:Inpatient CM Date of Admission:12/05/2018 Diagnosis:SOB Date of [place] Medicare *PCP Office Does [facility]*   Outreach attempt #2 to patient. Spoke with patient. RN CM dicussed referral source and reason. Patient stated that he never consented to receiving [facility] services and post discharge calls. RN CM reviewed with patient that he had indeed spoken with [facility] hospital liaison and was agreeable at that time.He voices that he is doing fine and "doesn't want to be bothered with calls." RN CM attempted to explain to patient the benefit and importance but patient did not wish to listen.He adamantlly declined services. Advised patient that RN CM had mailed out [facility] letter, brochure and contact info via mail and that he could call at any time in the future if he changes his mind. He voiced understanding.      Plan: RN CM will close case at this time. RN CM will send MD case closure letter.   Enzo Montgomery, RN,BSN,CCM [place] Management Telephonic Care Management Coordinator Direct Phone: [phone] Toll Free: [phone] Fax: [phone]

## 2018-12-20 ENCOUNTER — Ambulatory Visit: Payer: Medicare HMO

## 2018-12-26 ENCOUNTER — Encounter (HOSPITAL_COMMUNITY): Payer: Medicare HMO

## 2019-01-05 DIAGNOSIS — I2781 Cor pulmonale (chronic): Secondary | ICD-10-CM | POA: Diagnosis not present

## 2019-01-05 DIAGNOSIS — Z0000 Encounter for general adult medical examination without abnormal findings: Secondary | ICD-10-CM | POA: Diagnosis not present

## 2019-01-05 DIAGNOSIS — M109 Gout, unspecified: Secondary | ICD-10-CM | POA: Diagnosis not present

## 2019-01-05 DIAGNOSIS — Z6841 Body Mass Index (BMI) 40.0 and over, adult: Secondary | ICD-10-CM | POA: Diagnosis not present

## 2019-01-05 DIAGNOSIS — E1165 Type 2 diabetes mellitus with hyperglycemia: Secondary | ICD-10-CM | POA: Diagnosis not present

## 2019-01-05 DIAGNOSIS — N183 Chronic kidney disease, stage 3 unspecified: Secondary | ICD-10-CM | POA: Diagnosis not present

## 2019-01-05 DIAGNOSIS — I2721 Secondary pulmonary arterial hypertension: Secondary | ICD-10-CM | POA: Diagnosis not present

## 2019-01-05 DIAGNOSIS — E1159 Type 2 diabetes mellitus with other circulatory complications: Secondary | ICD-10-CM | POA: Diagnosis not present

## 2019-01-05 DIAGNOSIS — E1151 Type 2 diabetes mellitus with diabetic peripheral angiopathy without gangrene: Secondary | ICD-10-CM | POA: Diagnosis not present

## 2019-01-05 DIAGNOSIS — I4891 Unspecified atrial fibrillation: Secondary | ICD-10-CM | POA: Diagnosis not present

## 2019-01-05 DIAGNOSIS — D649 Anemia, unspecified: Secondary | ICD-10-CM | POA: Diagnosis not present

## 2019-01-05 DIAGNOSIS — Z1331 Encounter for screening for depression: Secondary | ICD-10-CM | POA: Diagnosis not present

## 2019-01-05 DIAGNOSIS — Z794 Long term (current) use of insulin: Secondary | ICD-10-CM | POA: Diagnosis not present

## 2019-01-05 DIAGNOSIS — G894 Chronic pain syndrome: Secondary | ICD-10-CM | POA: Diagnosis not present

## 2019-01-05 DIAGNOSIS — I130 Hypertensive heart and chronic kidney disease with heart failure and stage 1 through stage 4 chronic kidney disease, or unspecified chronic kidney disease: Secondary | ICD-10-CM | POA: Diagnosis not present

## 2019-01-05 DIAGNOSIS — I5042 Chronic combined systolic (congestive) and diastolic (congestive) heart failure: Secondary | ICD-10-CM | POA: Diagnosis not present

## 2019-01-05 DIAGNOSIS — I350 Nonrheumatic aortic (valve) stenosis: Secondary | ICD-10-CM | POA: Diagnosis not present

## 2019-01-05 DIAGNOSIS — Z7189 Other specified counseling: Secondary | ICD-10-CM | POA: Diagnosis not present

## 2019-01-08 ENCOUNTER — Telehealth (HOSPITAL_COMMUNITY): Payer: Self-pay | Admitting: Pharmacist

## 2019-01-08 NOTE — Telephone Encounter (Signed)
Contacted patient to inform him that he needs to provide income documentation to [employer] in order for the company to process his patient assistance application. Patient called back and left a message stating he had a social security statement, but was unable to bring it into the clinic at this time due to gout. I left patient another message informing him that he could mail the form to our clinic or directly to [employer]. Addresses proved to patient for the [facility] and [employer].   Will continue to follow.  Audry Riles, PharmD, BCPS, CPP Heart Failure Clinic Pharmacist [phone]

## 2019-01-12 NOTE — Telephone Encounter (Signed)
Obtained PAN [place] for [employer] ID: [id number] Group ID: [id number] RxBin ID: [id number] PCN: [id number] Eligibility Start Date: 10/14/2018 Eligibility End Date: 01/11/2020 Assistance Amount: $1,000.00  Provided information to patient's pharmacy. Patient aware. I encouraged patient to call the HF Clinic to reschedule his follow-up appointment with Dr. Haroldine Laws. He had to cancel his last appointment due to a gout flare.   Audry Riles, PharmD, BCPS, CPP Heart Failure Clinic Pharmacist [phone]

## 2019-01-15 DIAGNOSIS — I130 Hypertensive heart and chronic kidney disease with heart failure and stage 1 through stage 4 chronic kidney disease, or unspecified chronic kidney disease: Secondary | ICD-10-CM | POA: Diagnosis not present

## 2019-01-15 DIAGNOSIS — Z7901 Long term (current) use of anticoagulants: Secondary | ICD-10-CM | POA: Diagnosis not present

## 2019-01-15 DIAGNOSIS — L03116 Cellulitis of left lower limb: Secondary | ICD-10-CM | POA: Diagnosis not present

## 2019-01-15 DIAGNOSIS — I5042 Chronic combined systolic (congestive) and diastolic (congestive) heart failure: Secondary | ICD-10-CM | POA: Diagnosis not present

## 2019-01-15 DIAGNOSIS — L539 Erythematous condition, unspecified: Secondary | ICD-10-CM | POA: Diagnosis not present

## 2019-01-15 DIAGNOSIS — L03115 Cellulitis of right lower limb: Secondary | ICD-10-CM | POA: Diagnosis not present

## 2019-01-15 DIAGNOSIS — N1832 Chronic kidney disease, stage 3b: Secondary | ICD-10-CM | POA: Diagnosis not present

## 2019-01-15 DIAGNOSIS — I480 Paroxysmal atrial fibrillation: Secondary | ICD-10-CM | POA: Diagnosis not present

## 2019-01-15 DIAGNOSIS — E7849 Other hyperlipidemia: Secondary | ICD-10-CM | POA: Diagnosis not present

## 2019-01-15 DIAGNOSIS — M109 Gout, unspecified: Secondary | ICD-10-CM | POA: Diagnosis not present

## 2019-01-15 DIAGNOSIS — E1165 Type 2 diabetes mellitus with hyperglycemia: Secondary | ICD-10-CM | POA: Diagnosis not present

## 2019-01-17 ENCOUNTER — Other Ambulatory Visit: Payer: Self-pay

## 2019-01-17 NOTE — Patient Outreach (Signed)
[facility]) Care Management  01/17/2019  Waring Woolard [birth year] [id number]   Telephone Screen  Referral Date: 01/16/2019 Referral Source: MD Office Referral Reason: "HF,DM,HTN" Insurance: [employer] Medicare   Outreach attempt #1 to patient. No answer. RN CM left HIPAA compliant voicemail message along with contact info.     Plan: RN CM will make outreach attempt to patient within 3-4 business days. RN CM will send unsuccessful outreach letter to patient.   Enzo Montgomery, RN,BSN,CCM [place] Management Telephonic Care Management Coordinator Direct Phone: [phone] Toll Free: [phone] Fax: [phone]

## 2019-01-19 ENCOUNTER — Other Ambulatory Visit: Payer: Self-pay

## 2019-01-19 NOTE — Patient Outreach (Signed)
[place] [facility]) Care Management  01/19/2019  Raymond Pratt 1950-09-23 [id number]   Telephone Screen  Referral Date: 01/16/2019 Referral Source: MD Office Referral Reason: "HF,DM,HTN" Insurance: [employer] Medicare   Outreach attempt #2 to patient. No answer at present.       Plan: RN CM will make outreach attempt to patient within 3-4 business days.   Enzo Montgomery, RN,BSN,CCM [place] Management Telephonic Care Management Coordinator Direct Phone: [phone] Toll Free: [phone] Fax: [phone]
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
## 2019-01-22 ENCOUNTER — Other Ambulatory Visit: Payer: Self-pay

## 2019-01-22 ENCOUNTER — Ambulatory Visit (HOSPITAL_COMMUNITY)
Admission: RE | Admit: 2019-01-22 | Discharge: 2019-01-22 | Disposition: A | Discharge status: Home / Self Care | Payer: Medicare HMO | Source: Ambulatory Visit | Attending: Adult Health | Admitting: Adult Health

## 2019-01-22 ENCOUNTER — Encounter (HOSPITAL_COMMUNITY): Payer: Self-pay

## 2019-01-22 ENCOUNTER — Encounter (HOSPITAL_COMMUNITY): Payer: Medicare HMO

## 2019-01-22 VITALS — BP 128/60 | HR 83 | Wt 305.4 lb

## 2019-01-22 DIAGNOSIS — I2510 Atherosclerotic heart disease of native coronary artery without angina pectoris: Secondary | ICD-10-CM | POA: Diagnosis not present

## 2019-01-22 DIAGNOSIS — I5042 Chronic combined systolic (congestive) and diastolic (congestive) heart failure: Secondary | ICD-10-CM | POA: Insufficient documentation

## 2019-01-22 DIAGNOSIS — G4733 Obstructive sleep apnea (adult) (pediatric): Secondary | ICD-10-CM | POA: Insufficient documentation

## 2019-01-22 DIAGNOSIS — I110 Hypertensive heart disease with heart failure: Secondary | ICD-10-CM | POA: Insufficient documentation

## 2019-01-22 DIAGNOSIS — M1990 Unspecified osteoarthritis, unspecified site: Secondary | ICD-10-CM | POA: Insufficient documentation

## 2019-01-22 DIAGNOSIS — I447 Left bundle-branch block, unspecified: Secondary | ICD-10-CM | POA: Diagnosis not present

## 2019-01-22 DIAGNOSIS — M109 Gout, unspecified: Secondary | ICD-10-CM | POA: Diagnosis not present

## 2019-01-22 DIAGNOSIS — I480 Paroxysmal atrial fibrillation: Secondary | ICD-10-CM

## 2019-01-22 DIAGNOSIS — Z9119 Patient's noncompliance with other medical treatment and regimen: Secondary | ICD-10-CM | POA: Diagnosis not present

## 2019-01-22 DIAGNOSIS — Z794 Long term (current) use of insulin: Secondary | ICD-10-CM | POA: Insufficient documentation

## 2019-01-22 DIAGNOSIS — E119 Type 2 diabetes mellitus without complications: Secondary | ICD-10-CM | POA: Diagnosis not present

## 2019-01-22 DIAGNOSIS — I4821 Permanent atrial fibrillation: Secondary | ICD-10-CM | POA: Insufficient documentation

## 2019-01-22 DIAGNOSIS — Z79899 Other long term (current) drug therapy: Secondary | ICD-10-CM | POA: Insufficient documentation

## 2019-01-22 DIAGNOSIS — Z7901 Long term (current) use of anticoagulants: Secondary | ICD-10-CM | POA: Diagnosis not present

## 2019-01-22 DIAGNOSIS — Z6841 Body Mass Index (BMI) 40.0 and over, adult: Secondary | ICD-10-CM | POA: Diagnosis not present

## 2019-01-22 DIAGNOSIS — I10 Essential (primary) hypertension: Secondary | ICD-10-CM | POA: Diagnosis not present

## 2019-01-22 MED ORDER — CARVEDILOL 3.125 MG PO TABS: 3.1250 mg | ORAL_TABLET | Freq: Two times a day (BID) | ORAL | 3 refills | Status: DC

## 2019-01-22 NOTE — Patient Instructions (Signed)
START Coreg 3.125mg  (1 tab) twice a day  You have been referred to Paramedicine.  You will be contacted to discuss them coming to your home if you qualify.  Your physician recommends that you schedule a follow-up appointment in: Monday,  January 4th, 2021 at 9:30AM.  Parking code [phone].  At the [facility], you and your health needs are our priority. As part of our continuing mission to provide you with exceptional heart care, we have created designated Provider Care Teams. These Care Teams include your primary Cardiologist (physician) and Advanced Practice Providers (APPs- Physician Assistants and Nurse Practitioners) who all work together to provide you with the care you need, when you need it.   You may see any of the following providers on your designated Care Team at your next follow up: Marland Kitchen Dr [name] . Dr [name] . Darrick Grinder, NP . Lyda Jester, PA   Please be sure to bring in all your medications bottles to every appointment.

## 2019-01-22 NOTE — Patient Outreach (Signed)
[facility]) Care Management  01/22/2019  Cuong Soloff 10-Jan-1951 [id number]   Telephone Screen  Referral Date:01/16/2019 Referral Source:MD Office Referral Reason:"HF,DM,HTN" Insurance:[employer]   Outreach attempt #3 to patient. No answer. RN CM left HIPAA compliant voicemail message along with contact info.    Plan: RN CM will close case if no response from letter mailed to patient.     Enzo Montgomery, RN,BSN,CCM [place] Management Telephonic Care Management Coordinator Direct Phone: [phone] Toll Free: [phone] Fax: [phone]

## 2019-01-22 NOTE — Progress Notes (Signed)
Per NP Amy, Coreg discontinued. Called patients pharmacy and made them aware. We have attempted to contact patient to make him aware however unsuccessful.  Advised pharmacy to let patient know medication has been discontinued and they state they will.

## 2019-01-23 ENCOUNTER — Telehealth (HOSPITAL_COMMUNITY): Payer: Self-pay

## 2019-01-23 ENCOUNTER — Ambulatory Visit: Payer: Self-pay

## 2019-01-23 ENCOUNTER — Encounter (HOSPITAL_COMMUNITY): Payer: Medicare HMO

## 2019-01-23 NOTE — Telephone Encounter (Signed)
I called Mr Roosevelt to schedule an appointment. He did not answer so I left a message with my information and reason for calling and requested he call me back to schedule his initial visit.   Jacquiline Doe, EMT 01/23/19

## 2019-01-25 DIAGNOSIS — Z7901 Long term (current) use of anticoagulants: Secondary | ICD-10-CM | POA: Diagnosis not present

## 2019-01-25 DIAGNOSIS — I480 Paroxysmal atrial fibrillation: Secondary | ICD-10-CM | POA: Diagnosis not present

## 2019-01-26 ENCOUNTER — Telehealth (HOSPITAL_COMMUNITY): Payer: Self-pay

## 2019-01-26 NOTE — Telephone Encounter (Signed)
I called Raymond Pratt to schedule an initial appointment. He stated he was interested in the program and had several questions regarding the purpose of the program and the scope of my responsibilities. We spoke for roughly 20 minutes and he agreed to let me come out for a visit on Tuesday of next week at 10:00.   Jacquiline Doe, EMT 01/26/19

## 2019-01-30 ENCOUNTER — Other Ambulatory Visit (HOSPITAL_COMMUNITY): Payer: Self-pay

## 2019-01-30 ENCOUNTER — Telehealth (HOSPITAL_COMMUNITY): Payer: Self-pay | Admitting: Pharmacist

## 2019-01-30 ENCOUNTER — Telehealth (HOSPITAL_COMMUNITY): Payer: Self-pay | Admitting: *Deleted

## 2019-01-30 DIAGNOSIS — I5022 Chronic systolic (congestive) heart failure: Secondary | ICD-10-CM

## 2019-01-30 MED ORDER — DIGOXIN 125 MCG PO TABS: 0.1250 mg | ORAL_TABLET | Freq: Every day | ORAL | 6 refills | Status: DC

## 2019-01-30 MED ORDER — TORSEMIDE 20 MG PO TABS: 80.0000 mg | ORAL_TABLET | Freq: Two times a day (BID) | ORAL | 1 refills | Status: DC

## 2019-01-30 NOTE — Telephone Encounter (Signed)
Patient is having difficulties splitting digoxin 0.25 mg tablets. Will send in new prescription for digoxin 0.125 mg tablets with instructions to take 1 tablet daily.

## 2019-01-30 NOTE — Progress Notes (Signed)
Paramedicine Encounter    Patient ID: Raymond Pratt, male    DOB: 1951/02/05, 68 y.o.   MRN: [id number]   Patient Care Team: Shon Baton, MD as PCP - General (Internal Medicine) Bensimhon, Shaune Pascal, MD as PCP - Cardiology (Cardiology) Florance, Tomasa Blase, RN as [place] Management  Patient Active Problem List   Diagnosis Date Noted  . CHF (congestive heart failure) ([place]) 12/05/2018  . Anemia   . Gram-negative bacteremia 02/12/2014  . Chills (without fever) 02/11/2014  . Acute liver failure 12/28/2013  . Acute on chronic heart failure ([place]) 12/28/2013  . A-fib ([place]) 03/07/2013  . HTN (hypertension) 02/16/2013  . Chronic combined systolic and diastolic heart failure ([place]) 02/01/2013  . Anticoagulated on Coumadin 01/15/2013  . Hyposmolality and/or hyponatremia 03/04/2012  . OSA on CPAP 03/04/2012  . Acute on chronic combined systolic and diastolic heart failure, NYHA class 4 ([place]) 02/28/2012  . Chest pressure 02/28/2012  . Type 2 diabetes mellitus with hyperlipidemia ([place]) 08/04/2008  . ATRIAL FIBRILLATION WITH RAPID VENTRICULAR RESPONSE 08/04/2008  . UTI 08/04/2008  . CELLULITIS, LEGS 08/04/2008  . OTHER ASCITES 08/04/2008    Current Outpatient Medications:  .  acetaminophen (TYLENOL) 325 MG tablet, Take 650 mg by mouth every 6 (six) hours as needed for mild pain., Disp: , Rfl:  .  allopurinol (ZYLOPRIM) 300 MG tablet, Take 300 mg by mouth daily. , Disp: , Rfl:  .  amiodarone (PACERONE) 200 MG tablet, Take 1 tablet (200 mg total) by mouth daily., Disp: 30 tablet, Rfl: 0 .  atorvastatin (LIPITOR) 40 MG tablet, Take 40 mg by mouth daily., Disp: , Rfl:  .  cholecalciferol (VITAMIN D3) 25 MCG (1000 UT) tablet, Take 1,000 Units by mouth daily., Disp: , Rfl:  .  Insulin Isophane & Regular Human (NOVOLIN 70/30 FLEXPEN RELION) (70-30) 100 UNIT/ML PEN, Inject 40 Units into the skin See admin instructions. Inject 40 units twice a day.,, Disp: 15 mL, Rfl:  11 .  Multiple Vitamins-Minerals (MULTIVITAMIN PO), Take 1 tablet by mouth daily., Disp: , Rfl:  .  sacubitril-valsartan (ENTRESTO) 24-26 MG, Take 1 tablet by mouth 2 (two) times daily., Disp: 60 tablet, Rfl: 1 .  senna-docusate (SENOKOT-S) 8.6-50 MG tablet, Take 2 tablets by mouth 2 (two) times daily., Disp: 30 tablet, Rfl: 0 .  spironolactone (ALDACTONE) 25 MG tablet, Take 1 tablet (25 mg total) by mouth daily., Disp: 30 tablet, Rfl: 3 .  traMADol (ULTRAM) 50 MG tablet, Take 50 mg by mouth at bedtime., Disp: , Rfl:  .  warfarin (COUMADIN) 5 MG tablet, Take 5 mg daily, Disp:  , Rfl:  .  digoxin (LANOXIN) 0.125 MG tablet, Take 1 tablet (0.125 mg total) by mouth daily., Disp: 30 tablet, Rfl: 6 .  guaiFENesin (MUCINEX) 600 MG 12 hr tablet, Take 1 tablet (600 mg total) by mouth 2 (two) times daily. (Patient not taking: Reported on 01/30/2019), Disp: 30 tablet, Rfl: 0 .  predniSONE (DELTASONE) 20 MG tablet, Take 2 tablets (40 mg total) by mouth daily with breakfast. (Patient not taking: Reported on 01/30/2019), Disp: 4 tablet, Rfl: 0 .  torsemide (DEMADEX) 20 MG tablet, Take 4 tablets (80 mg total) by mouth 2 (two) times daily., Disp: 60 tablet, Rfl: 1 Allergies  Allergen Reactions  . Actos [Pioglitazone] Other (See Comments)    Made the patient retain fluid      Social History   Socioeconomic History  . Marital status: Divorced    Spouse name: Not on file  .  Number of children: Not on file  . Years of education: Not on file  . Highest education level: Not on file  Occupational History  . Occupation: Unemployed Research scientist (physical sciences): [employer]  . Financial resource strain: Not on file  . Food insecurity    Worry: Not on file    Inability: Not on file  . Transportation needs    Medical: Not on file    Non-medical: Not on file  Tobacco Use  . Smoking status: Never Smoker  . Smokeless tobacco: Never Used  Substance and Sexual Activity  . Alcohol use: No  .  Drug use: No  . Sexual activity: Never  Lifestyle  . Physical activity    Days per week: Not on file    Minutes per session: Not on file  . Stress: Not on file  Relationships  . Social Herbalist on phone: Not on file    Gets together: Not on file    Attends religious service: Not on file    Active member of club or organization: Not on file    Attends meetings of clubs or organizations: Not on file    Relationship status: Not on file  . Intimate partner violence    Fear of current or ex partner: Not on file    Emotionally abused: Not on file    Physically abused: Not on file    Forced sexual activity: Not on file  Other Topics Concern  . Not on file  Social History Narrative   Lives alone.    Physical Exam Cardiovascular:     Rate and Rhythm: Normal rate and regular rhythm.     Pulses: Normal pulses.  Pulmonary:     Effort: Pulmonary effort is normal.     Breath sounds: Normal breath sounds.  Musculoskeletal: Normal range of motion.     Right lower leg: Edema present.     Left lower leg: Edema present.  Skin:    General: Skin is warm and dry.     Capillary Refill: Capillary refill takes less than 2 seconds.  Neurological:     Mental Status: He is alert and oriented to person, place, and time.  Psychiatric:        Mood and Affect: Mood normal.         Future Appointments  Date Time Provider [place]  02/07/2019  8:30 AM [facility] LAB [facility] None  03/12/2019  9:30 AM [facility] PA/NP [facility] None    BP 104/60 (BP Location: Left Arm, Patient Position: Sitting, Cuff Size: Normal)   Pulse 96   Resp 18   Wt (!) 306 lb (138.8 kg)   SpO2 96%   BMI 47.93 kg/m   Weight yesterday- n/a Last visit weight- 305 lb  Raymond Pratt was seen at home today for our initial visit and reported feeling generally well. He denied episodes of chest pain, SOB, headache, dizziness, orthopnea, fever or cough in the past week. He stated he has been compliant with his  medications but he has not been weighing because he doesn't trust the scales and says he can tell better that he is gaining fluid by the way he feels. I explained that weight is a better indicator because it allows [place] to monitor small changes and make adjustments before he begins feeling unwell, at which time he may be too unwell to avoid a hospitalization. He expressed understanding and said he would begin weighing daily.  Upon looking over his medications I noted several medications were broken, dirty or expired. I was able to order several medications and he stated he would pick them up tomorrow. I advised him to call me when he has the medications and I will come by and fill a pillbox for him. He was understanding and agreeable. I will follow up tomorrow.   Jacquiline Doe, EMT 01/30/19  ACTION: Home visit completed Next visit planned for 1 week

## 2019-01-30 NOTE — Telephone Encounter (Signed)
Raymond Pratt called to report that [facility] stopped patients weekly 5mg  of Metolazone and decreased his torsemide from 80mg  bid to 60mg  bid due to recent lab work.  11/16  creat-1.8 BUN- 41 K- 4.4  Per Brittainy Simmons,PA increase torsemide back to 80mg  bid and get bmet bnp next week Raymond Pratt and pt aware. Lab appt scheduled.

## 2019-01-31 ENCOUNTER — Other Ambulatory Visit (HOSPITAL_COMMUNITY): Payer: Self-pay

## 2019-01-31 ENCOUNTER — Other Ambulatory Visit: Payer: Self-pay

## 2019-01-31 NOTE — Patient Outreach (Signed)
[facility]) Care Management  01/31/2019  Raymond Pratt 08/12/1950 [id number]   Telephone Screen  Referral Date:01/16/2019 Referral Source:MD Office Referral Reason:"HF,DM,HTN" Insurance:[employer]   Multiple attempts to establish contact with patient without success. No response from letter mailed to patient. Case is being closed at this time.     Plan: RN CM will close case at this time. RN CM will send MD closure letter.  Enzo Montgomery, RN,BSN,CCM [place] Management Telephonic Care Management Coordinator Direct Phone: [phone] Toll Free: [phone] Fax: [phone]

## 2019-01-31 NOTE — Progress Notes (Signed)
Raymond Pratt was seen at home today to set up a pillbox. He had picked up his medications from the pharmacy and was able to observe me fill his box. After I completed filling the box I went over it in detail so he understood where his medications were and knew when to take each slot. He expressed understanding and was agreeable to using the box. I will follow up next week after his lab work has resulted.  Jacquiline Doe, EMT 01/31/19

## 2019-02-07 ENCOUNTER — Other Ambulatory Visit: Payer: Self-pay

## 2019-02-07 ENCOUNTER — Ambulatory Visit (HOSPITAL_COMMUNITY)
Admission: RE | Admit: 2019-02-07 | Discharge: 2019-02-07 | Disposition: A | Discharge status: Home / Self Care | Payer: Medicare HMO | Source: Ambulatory Visit | Attending: Internal Medicine | Admitting: Internal Medicine

## 2019-02-07 DIAGNOSIS — I5022 Chronic systolic (congestive) heart failure: Secondary | ICD-10-CM | POA: Insufficient documentation

## 2019-02-07 LAB — BASIC METABOLIC PANEL
Anion gap: 14 (ref 5–15)
BUN: 43 mg/dL — ABNORMAL HIGH (ref 8–23)
CO2: 31 mmol/L (ref 22–32)
Calcium: 9.4 mg/dL (ref 8.9–10.3)
Chloride: 91 mmol/L — ABNORMAL LOW (ref 98–111)
Creatinine, Ser: 1.71 mg/dL — ABNORMAL HIGH (ref 0.61–1.24)
GFR calc Af Amer: 47 mL/min — ABNORMAL LOW (ref >60)
GFR calc non Af Amer: 41 mL/min — ABNORMAL LOW (ref >60)
Glucose, Bld: 385 mg/dL — ABNORMAL HIGH (ref 70–99)
Potassium: 4.1 mmol/L (ref 3.5–5.1)
Sodium: 136 mmol/L (ref 135–145)

## 2019-02-07 LAB — BRAIN NATRIURETIC PEPTIDE: B Natriuretic Peptide: 56.8 pg/mL (ref 0.0–100.0)

## 2019-02-08 ENCOUNTER — Other Ambulatory Visit (HOSPITAL_COMMUNITY): Payer: Self-pay

## 2019-02-08 DIAGNOSIS — L03116 Cellulitis of left lower limb: Secondary | ICD-10-CM | POA: Diagnosis not present

## 2019-02-08 DIAGNOSIS — L03115 Cellulitis of right lower limb: Secondary | ICD-10-CM | POA: Diagnosis not present

## 2019-02-08 DIAGNOSIS — I480 Paroxysmal atrial fibrillation: Secondary | ICD-10-CM | POA: Diagnosis not present

## 2019-02-08 DIAGNOSIS — Z7901 Long term (current) use of anticoagulants: Secondary | ICD-10-CM | POA: Diagnosis not present

## 2019-02-08 DIAGNOSIS — L539 Erythematous condition, unspecified: Secondary | ICD-10-CM | POA: Diagnosis not present

## 2019-02-08 NOTE — Progress Notes (Signed)
Paramedicine Encounter    Patient ID: Raymond Pratt, male    DOB: [birth year], 68 y.o.   MRN: [id number]   Patient Care Team: Raymond Baton, MD as PCP - General (Internal Medicine) Raymond Pratt, Raymond Pascal, MD as PCP - Cardiology (Cardiology)  Patient Active Problem List   Diagnosis Date Noted  . CHF (congestive heart failure) ([place]) 12/05/2018  . Anemia   . Gram-negative bacteremia 02/12/2014  . Chills (without fever) 02/11/2014  . Acute liver failure 12/28/2013  . Acute on chronic heart failure ([place]) 12/28/2013  . A-fib ([place]) 03/07/2013  . HTN (hypertension) 02/16/2013  . Chronic combined systolic and diastolic heart failure ([place]) 02/01/2013  . Anticoagulated on Coumadin 01/15/2013  . Hyposmolality and/or hyponatremia 03/04/2012  . OSA on CPAP 03/04/2012  . Acute on chronic combined systolic and diastolic heart failure, NYHA class 4 ([place]) 02/28/2012  . Chest pressure 02/28/2012  . Type 2 diabetes mellitus with hyperlipidemia ([place]) 08/04/2008  . ATRIAL FIBRILLATION WITH RAPID VENTRICULAR RESPONSE 08/04/2008  . UTI 08/04/2008  . CELLULITIS, LEGS 08/04/2008  . OTHER ASCITES 08/04/2008    Current Outpatient Medications:  .  acetaminophen (TYLENOL) 325 MG tablet, Take 650 mg by mouth every 6 (six) hours as needed for mild pain., Disp: , Rfl:  .  allopurinol (ZYLOPRIM) 300 MG tablet, Take 300 mg by mouth daily. , Disp: , Rfl:  .  amiodarone (PACERONE) 200 MG tablet, Take 1 tablet (200 mg total) by mouth daily., Disp: 30 tablet, Rfl: 0 .  atorvastatin (LIPITOR) 40 MG tablet, Take 40 mg by mouth daily., Disp: , Rfl:  .  cholecalciferol (VITAMIN D3) 25 MCG (1000 UT) tablet, Take 1,000 Units by mouth daily., Disp: , Rfl:  .  digoxin (LANOXIN) 0.125 MG tablet, Take 1 tablet (0.125 mg total) by mouth daily., Disp: 30 tablet, Rfl: 6 .  Insulin Isophane & Regular Human (NOVOLIN 70/30 FLEXPEN RELION) (70-30) 100 UNIT/ML PEN, Inject 40 Units into the skin See admin instructions. Inject 40 units  twice a day.,, Disp: 15 mL, Rfl: 11 .  Multiple Vitamins-Minerals (MULTIVITAMIN PO), Take 1 tablet by mouth daily., Disp: , Rfl:  .  sacubitril-valsartan (ENTRESTO) 24-26 MG, Take 1 tablet by mouth 2 (two) times daily., Disp: 60 tablet, Rfl: 1 .  senna-docusate (SENOKOT-S) 8.6-50 MG tablet, Take 2 tablets by mouth 2 (two) times daily., Disp: 30 tablet, Rfl: 0 .  spironolactone (ALDACTONE) 25 MG tablet, Take 1 tablet (25 mg total) by mouth daily., Disp: 30 tablet, Rfl: 3 .  torsemide (DEMADEX) 20 MG tablet, Take 4 tablets (80 mg total) by mouth 2 (two) times daily., Disp: 60 tablet, Rfl: 1 .  traMADol (ULTRAM) 50 MG tablet, Take 50 mg by mouth at bedtime., Disp: , Rfl:  .  warfarin (COUMADIN) 5 MG tablet, Take 5 mg daily, Disp:  , Rfl:  .  guaiFENesin (MUCINEX) 600 MG 12 hr tablet, Take 1 tablet (600 mg total) by mouth 2 (two) times daily. (Patient not taking: Reported on 01/30/2019), Disp: 30 tablet, Rfl: 0 .  predniSONE (DELTASONE) 20 MG tablet, Take 2 tablets (40 mg total) by mouth daily with breakfast. (Patient not taking: Reported on 01/30/2019), Disp: 4 tablet, Rfl: 0 Allergies  Allergen Reactions  . Actos [Pioglitazone] Other (See Comments)    Made the patient retain fluid      Social History   Socioeconomic History  . Marital status: Divorced    Spouse name: Not on file  . Number of children: Not on file  .  Years of education: Not on file  . Highest education level: Not on file  Occupational History  . Occupation: Unemployed Research scientist (physical sciences): [employer]  . Financial resource strain: Not on file  . Food insecurity    Worry: Not on file    Inability: Not on file  . Transportation needs    Medical: Not on file    Non-medical: Not on file  Tobacco Use  . Smoking status: Never Smoker  . Smokeless tobacco: Never Used  Substance and Sexual Activity  . Alcohol use: No  . Drug use: No  . Sexual activity: Never  Lifestyle  . Physical activity     Days per week: Not on file    Minutes per session: Not on file  . Stress: Not on file  Relationships  . Social Herbalist on phone: Not on file    Gets together: Not on file    Attends religious service: Not on file    Active member of club or organization: Not on file    Attends meetings of clubs or organizations: Not on file    Relationship status: Not on file  . Intimate partner violence    Fear of current or ex partner: Not on file    Emotionally abused: Not on file    Physically abused: Not on file    Forced sexual activity: Not on file  Other Topics Concern  . Not on file  Social History Narrative   Lives alone.    Physical Exam Cardiovascular:     Rate and Rhythm: Normal rate and regular rhythm.     Pulses: Normal pulses.  Pulmonary:     Effort: Pulmonary effort is normal.     Breath sounds: Normal breath sounds.  Musculoskeletal: Normal range of motion.     Right lower leg: No edema.     Left lower leg: No edema.  Skin:    General: Skin is warm and dry.     Capillary Refill: Capillary refill takes less than 2 seconds.  Neurological:     Mental Status: He is alert and oriented to person, place, and time.         Future Appointments  Date Time Provider [place]  03/12/2019  9:30 AM [facility] PA/NP [facility] None    BP 110/60 (BP Location: Left Arm, Patient Position: Sitting, Cuff Size: Large)   Pulse 87   Resp 16   Wt (!) 306 lb (138.8 kg)   SpO2 95%   BMI 47.93 kg/m   Weight yesterday- 306 lb Last visit weight- 306 lb  [name] was seen at home today and reported feeling well. He denied chest pain, SOB, headache, dizziness orthopnea, cough of fever since our last visit. He stated he has been compliant with his medications over the past week and his weight has been stable. His medications were verified and his pillbox was refilled.  He is scheduled for an INR check later today at his PCP's office so I will return if necessary to make  any changes to his pillbox, otherwise I will follow up next week.   Raymond Pratt, EMT 02/08/19  ACTION: Home visit completed Next visit planned for 1 week

## 2019-02-09 ENCOUNTER — Telehealth (HOSPITAL_COMMUNITY): Payer: Self-pay

## 2019-02-09 NOTE — Telephone Encounter (Signed)
Pt called back asking about recent lab work and he was expecting office to call him.  Advised communication about lab work was btwn PA and [employer].  He spoke with Zack and was informed there were no changes.  I advised him PA was satisfied with blood work as they were stable and office does not call for stable/normal labs.  Advised based on lab work there were no changes.  Pt verbalized appreciation.

## 2019-02-09 NOTE — Telephone Encounter (Signed)
Pt left message on vm asking to discuss labs done recently. LM for patient to call office back

## 2019-02-15 ENCOUNTER — Other Ambulatory Visit (HOSPITAL_COMMUNITY): Payer: Self-pay

## 2019-02-15 ENCOUNTER — Telehealth (HOSPITAL_COMMUNITY): Payer: Self-pay | Admitting: Pharmacist

## 2019-02-15 MED ORDER — TORSEMIDE 20 MG PO TABS: 80.0000 mg | ORAL_TABLET | Freq: Two times a day (BID) | ORAL | 3 refills | Status: DC

## 2019-02-15 MED ORDER — AMIODARONE HCL 200 MG PO TABS: 200.0000 mg | ORAL_TABLET | Freq: Every day | ORAL | 6 refills | Status: DC

## 2019-02-15 NOTE — Progress Notes (Signed)
Paramedicine Encounter    Patient ID: Raymond Pratt, male    DOB: 30-Jan-1951, 68 y.o.   MRN: [id number]   Patient Care Team: Raymond Baton, MD as PCP - General (Internal Medicine) Raymond Pratt, Raymond Pascal, MD as PCP - Cardiology (Cardiology)  Patient Active Problem List   Diagnosis Date Noted  . CHF (congestive heart failure) ([place]) 12/05/2018  . Anemia   . Gram-negative bacteremia 02/12/2014  . Chills (without fever) 02/11/2014  . Acute liver failure 12/28/2013  . Acute on chronic heart failure ([place]) 12/28/2013  . A-fib ([place]) 03/07/2013  . HTN (hypertension) 02/16/2013  . Chronic combined systolic and diastolic heart failure ([place]) 02/01/2013  . Anticoagulated on Coumadin 01/15/2013  . Hyposmolality and/or hyponatremia 03/04/2012  . OSA on CPAP 03/04/2012  . Acute on chronic combined systolic and diastolic heart failure, NYHA class 4 ([place]) 02/28/2012  . Chest pressure 02/28/2012  . Type 2 diabetes mellitus with hyperlipidemia ([place]) 08/04/2008  . ATRIAL FIBRILLATION WITH RAPID VENTRICULAR RESPONSE 08/04/2008  . UTI 08/04/2008  . CELLULITIS, LEGS 08/04/2008  . OTHER ASCITES 08/04/2008    Current Outpatient Medications:  .  acetaminophen (TYLENOL) 325 MG tablet, Take 650 mg by mouth every 6 (six) hours as needed for mild pain., Disp: , Rfl:  .  allopurinol (ZYLOPRIM) 300 MG tablet, Take 300 mg by mouth daily. , Disp: , Rfl:  .  atorvastatin (LIPITOR) 40 MG tablet, Take 40 mg by mouth daily., Disp: , Rfl:  .  cholecalciferol (VITAMIN D3) 25 MCG (1000 UT) tablet, Take 1,000 Units by mouth daily., Disp: , Rfl:  .  digoxin (LANOXIN) 0.125 MG tablet, Take 1 tablet (0.125 mg total) by mouth daily., Disp: 30 tablet, Rfl: 6 .  Insulin Isophane & Regular Human (NOVOLIN 70/30 FLEXPEN RELION) (70-30) 100 UNIT/ML PEN, Inject 40 Units into the skin See admin instructions. Inject 40 units twice a day.,, Disp: 15 mL, Rfl: 11 .  Multiple Vitamins-Minerals (MULTIVITAMIN PO), Take 1 tablet by mouth  daily., Disp: , Rfl:  .  sacubitril-valsartan (ENTRESTO) 24-26 MG, Take 1 tablet by mouth 2 (two) times daily., Disp: 60 tablet, Rfl: 1 .  senna-docusate (SENOKOT-S) 8.6-50 MG tablet, Take 2 tablets by mouth 2 (two) times daily., Disp: 30 tablet, Rfl: 0 .  spironolactone (ALDACTONE) 25 MG tablet, Take 1 tablet (25 mg total) by mouth daily., Disp: 30 tablet, Rfl: 3 .  traMADol (ULTRAM) 50 MG tablet, Take 50 mg by mouth at bedtime., Disp: , Rfl:  .  warfarin (COUMADIN) 5 MG tablet, Take 5 mg daily, Disp:  , Rfl:  .  amiodarone (PACERONE) 200 MG tablet, Take 1 tablet (200 mg total) by mouth daily., Disp: 30 tablet, Rfl: 6 .  guaiFENesin (MUCINEX) 600 MG 12 hr tablet, Take 1 tablet (600 mg total) by mouth 2 (two) times daily. (Patient not taking: Reported on 01/30/2019), Disp: 30 tablet, Rfl: 0 .  predniSONE (DELTASONE) 20 MG tablet, Take 2 tablets (40 mg total) by mouth daily with breakfast. (Patient not taking: Reported on 01/30/2019), Disp: 4 tablet, Rfl: 0 .  torsemide (DEMADEX) 20 MG tablet, Take 4 tablets (80 mg total) by mouth 2 (two) times daily., Disp: 120 tablet, Rfl: 3 Allergies  Allergen Reactions  . Actos [Pioglitazone] Other (See Comments)    Made the patient retain fluid      Social History   Socioeconomic History  . Marital status: Divorced    Spouse name: Not on file  . Number of children: Not on file  .  Years of education: Not on file  . Highest education level: Not on file  Occupational History  . Occupation: Unemployed Research scientist (physical sciences): [employer]  Tobacco Use  . Smoking status: Never Smoker  . Smokeless tobacco: Never Used  Substance and Sexual Activity  . Alcohol use: No  . Drug use: No  . Sexual activity: Never  Other Topics Concern  . Not on file  Social History Narrative   Lives alone.   Social Determinants of Health   Financial Resource Strain:   . Difficulty of Paying Living Expenses: Not on file  Food Insecurity:   . Worried About  Charity fundraiser in the Last Year: Not on file  . Ran Out of Food in the Last Year: Not on file  Transportation Needs:   . Lack of Transportation (Medical): Not on file  . Lack of Transportation (Non-Medical): Not on file  Physical Activity:   . Days of Exercise per Week: Not on file  . Minutes of Exercise per Session: Not on file  Stress:   . Feeling of Stress : Not on file  Social Connections:   . Frequency of Communication with Friends and Family: Not on file  . Frequency of Social Gatherings with Friends and Family: Not on file  . Attends Religious Services: Not on file  . Active Member of Clubs or Organizations: Not on file  . Attends Archivist Meetings: Not on file  . Marital Status: Not on file  Intimate Partner Violence:   . Fear of Current or Ex-Partner: Not on file  . Emotionally Abused: Not on file  . Physically Abused: Not on file  . Sexually Abused: Not on file    Physical Exam Cardiovascular:     Rate and Rhythm: Normal rate and regular rhythm.     Pulses: Normal pulses.  Pulmonary:     Effort: Pulmonary effort is normal.     Breath sounds: Normal breath sounds.  Abdominal:     General: Abdomen is flat.  Musculoskeletal:        General: Normal range of motion.     Right lower leg: No edema.     Left lower leg: No edema.  Skin:    General: Skin is warm and dry.     Capillary Refill: Capillary refill takes less than 2 seconds.  Neurological:     Mental Status: He is alert and oriented to person, place, and time.  Psychiatric:        Mood and Affect: Mood normal.         Future Appointments  Date Time Provider [place]  03/12/2019  9:30 AM [facility] PA/NP [facility] None    BP 120/60 (BP Location: Left Arm, Patient Position: Sitting, Cuff Size: Large)   Pulse 94   Resp 18   Wt (!) 306 lb (138.8 kg)   SpO2 94%   BMI 47.93 kg/m   Weight yesterday- 305 lb Last visit weight- 306 lb  Raymond Pratt was seen at home today and  reported feeling well. He denied chest pain, SOB, headache, dizziness, orthopnea, fever or cough since our last visit. He stated he has been compliant with his medications and his weight has been stable. His medications were verified and his pillbox was refilled. New prescriptions of amiodarone and torsemide were sent in via Audry Riles from the HF clinic to [facility].  I will follow up next week   Jacquiline Doe, EMT 02/15/19  ACTION: Home visit completed Next visit planned for 1 week

## 2019-02-15 NOTE — Telephone Encounter (Signed)
Refills for amiodarone and torsemide sent to [place] per paramedicine Edwyna Ready) request.   Audry Riles, PharmD, BCPS, BCCP, CPP Heart Failure Clinic Pharmacist [phone]

## 2019-02-22 ENCOUNTER — Other Ambulatory Visit (HOSPITAL_COMMUNITY): Payer: Self-pay

## 2019-02-22 NOTE — Progress Notes (Signed)
Paramedicine Encounter    Patient ID: Raymond Pratt, male    DOB: 1950-04-03, 68 y.o.   MRN: [id number]   Patient Care Team: Shon Baton, MD as PCP - General (Internal Medicine) Haroldine Laws, Shaune Pascal, MD as PCP - Cardiology (Cardiology)  Patient Active Problem List   Diagnosis Date Noted  . CHF (congestive heart failure) ([place]) 12/05/2018  . Anemia   . Gram-negative bacteremia 02/12/2014  . Chills (without fever) 02/11/2014  . Acute liver failure 12/28/2013  . Acute on chronic heart failure ([place]) 12/28/2013  . A-fib ([place]) 03/07/2013  . HTN (hypertension) 02/16/2013  . Chronic combined systolic and diastolic heart failure ([place]) 02/01/2013  . Anticoagulated on Coumadin 01/15/2013  . Hyposmolality and/or hyponatremia 03/04/2012  . OSA on CPAP 03/04/2012  . Acute on chronic combined systolic and diastolic heart failure, NYHA class 4 ([place]) 02/28/2012  . Chest pressure 02/28/2012  . Type 2 diabetes mellitus with hyperlipidemia ([place]) 08/04/2008  . ATRIAL FIBRILLATION WITH RAPID VENTRICULAR RESPONSE 08/04/2008  . UTI 08/04/2008  . CELLULITIS, LEGS 08/04/2008  . OTHER ASCITES 08/04/2008    Current Outpatient Medications:  .  acetaminophen (TYLENOL) 325 MG tablet, Take 650 mg by mouth every 6 (six) hours as needed for mild pain., Disp: , Rfl:  .  allopurinol (ZYLOPRIM) 300 MG tablet, Take 300 mg by mouth daily. , Disp: , Rfl:  .  amiodarone (PACERONE) 200 MG tablet, Take 1 tablet (200 mg total) by mouth daily., Disp: 30 tablet, Rfl: 6 .  atorvastatin (LIPITOR) 40 MG tablet, Take 40 mg by mouth daily., Disp: , Rfl:  .  cholecalciferol (VITAMIN D3) 25 MCG (1000 UT) tablet, Take 1,000 Units by mouth daily., Disp: , Rfl:  .  digoxin (LANOXIN) 0.125 MG tablet, Take 1 tablet (0.125 mg total) by mouth daily., Disp: 30 tablet, Rfl: 6 .  guaiFENesin (MUCINEX) 600 MG 12 hr tablet, Take 1 tablet (600 mg total) by mouth 2 (two) times daily., Disp: 30 tablet, Rfl: 0 .  Insulin Isophane & Regular  Human (NOVOLIN 70/30 FLEXPEN RELION) (70-30) 100 UNIT/ML PEN, Inject 40 Units into the skin See admin instructions. Inject 40 units twice a day.,, Disp: 15 mL, Rfl: 11 .  Multiple Vitamins-Minerals (MULTIVITAMIN PO), Take 1 tablet by mouth daily., Disp: , Rfl:  .  sacubitril-valsartan (ENTRESTO) 24-26 MG, Take 1 tablet by mouth 2 (two) times daily., Disp: 60 tablet, Rfl: 1 .  senna-docusate (SENOKOT-S) 8.6-50 MG tablet, Take 2 tablets by mouth 2 (two) times daily., Disp: 30 tablet, Rfl: 0 .  spironolactone (ALDACTONE) 25 MG tablet, Take 1 tablet (25 mg total) by mouth daily., Disp: 30 tablet, Rfl: 3 .  torsemide (DEMADEX) 20 MG tablet, Take 4 tablets (80 mg total) by mouth 2 (two) times daily., Disp: 120 tablet, Rfl: 3 .  traMADol (ULTRAM) 50 MG tablet, Take 50 mg by mouth at bedtime., Disp: , Rfl:  .  warfarin (COUMADIN) 5 MG tablet, Take 5 mg daily, Disp:  , Rfl:  .  predniSONE (DELTASONE) 20 MG tablet, Take 2 tablets (40 mg total) by mouth daily with breakfast. (Patient not taking: Reported on 01/30/2019), Disp: 4 tablet, Rfl: 0 Allergies  Allergen Reactions  . Actos [Pioglitazone] Other (See Comments)    Made the patient retain fluid      Social History   Socioeconomic History  . Marital status: Divorced    Spouse name: Not on file  . Number of children: Not on file  . Years of education: Not on file  .  Highest education level: Not on file  Occupational History  . Occupation: Unemployed Research scientist (physical sciences): [employer]  Tobacco Use  . Smoking status: Never Smoker  . Smokeless tobacco: Never Used  Substance and Sexual Activity  . Alcohol use: No  . Drug use: No  . Sexual activity: Never  Other Topics Concern  . Not on file  Social History Narrative   Lives alone.   Social Determinants of Health   Financial Resource Strain:   . Difficulty of Paying Living Expenses: Not on file  Food Insecurity:   . Worried About Charity fundraiser in the Last Year: Not on  file  . Ran Out of Food in the Last Year: Not on file  Transportation Needs:   . Lack of Transportation (Medical): Not on file  . Lack of Transportation (Non-Medical): Not on file  Physical Activity:   . Days of Exercise per Week: Not on file  . Minutes of Exercise per Session: Not on file  Stress:   . Feeling of Stress : Not on file  Social Connections:   . Frequency of Communication with Friends and Family: Not on file  . Frequency of Social Gatherings with Friends and Family: Not on file  . Attends Religious Services: Not on file  . Active Member of Clubs or Organizations: Not on file  . Attends Archivist Meetings: Not on file  . Marital Status: Not on file  Intimate Partner Violence:   . Fear of Current or Ex-Partner: Not on file  . Emotionally Abused: Not on file  . Physically Abused: Not on file  . Sexually Abused: Not on file    Physical Exam Cardiovascular:     Rate and Rhythm: Normal rate and regular rhythm.     Pulses: Normal pulses.  Pulmonary:     Effort: Pulmonary effort is normal.     Breath sounds: Normal breath sounds.  Musculoskeletal:        General: Normal range of motion.     Right lower leg: No edema.     Left lower leg: No edema.  Skin:    General: Skin is warm and dry.     Capillary Refill: Capillary refill takes less than 2 seconds.  Neurological:     Mental Status: He is alert and oriented to person, place, and time.  Psychiatric:        Mood and Affect: Mood normal.         Future Appointments  Date Time Provider [place]  03/12/2019  9:30 AM [facility] PA/NP [facility] None    BP 108/68 (BP Location: Left Arm, Patient Position: Sitting, Cuff Size: Large)   Pulse 86   Resp 16   Wt (!) 303 lb (137.4 kg)   SpO2 96%   BMI 47.46 kg/m   Weight yesterday- Did not weigh Last visit weight- 306 lb  Mr Constante was seen at home today and reported feeling well. He denied chest pain, SOB, headache, dizziness, orthopnea, fever  or cough. He stated he has been compliant with his medications over the past week and his weight has been stable. His medications were verified and his pillbox was refilled. I will follow up next week.   Jacquiline Doe, EMT 02/22/19  ACTION: Home visit completed Next visit planned for 1 week

## 2019-03-01 ENCOUNTER — Other Ambulatory Visit (HOSPITAL_COMMUNITY): Payer: Self-pay

## 2019-03-01 NOTE — Progress Notes (Signed)
Paramedicine Encounter    Patient ID: Raymond Pratt, male    DOB: 10/02/1950, 68 y.o.   MRN: [id number]   Patient Care Team: Shon Baton, MD as PCP - General (Internal Medicine) Haroldine Laws, Shaune Pascal, MD as PCP - Cardiology (Cardiology)  Patient Active Problem List   Diagnosis Date Noted  . CHF (congestive heart failure) ([place]) 12/05/2018  . Anemia   . Gram-negative bacteremia 02/12/2014  . Chills (without fever) 02/11/2014  . Acute liver failure 12/28/2013  . Acute on chronic heart failure ([place]) 12/28/2013  . A-fib ([place]) 03/07/2013  . HTN (hypertension) 02/16/2013  . Chronic combined systolic and diastolic heart failure ([place]) 02/01/2013  . Anticoagulated on Coumadin 01/15/2013  . Hyposmolality and/or hyponatremia 03/04/2012  . OSA on CPAP 03/04/2012  . Acute on chronic combined systolic and diastolic heart failure, NYHA class 4 ([place]) 02/28/2012  . Chest pressure 02/28/2012  . Type 2 diabetes mellitus with hyperlipidemia ([place]) 08/04/2008  . ATRIAL FIBRILLATION WITH RAPID VENTRICULAR RESPONSE 08/04/2008  . UTI 08/04/2008  . CELLULITIS, LEGS 08/04/2008  . OTHER ASCITES 08/04/2008    Current Outpatient Medications:  .  acetaminophen (TYLENOL) 325 MG tablet, Take 650 mg by mouth every 6 (six) hours as needed for mild pain., Disp: , Rfl:  .  allopurinol (ZYLOPRIM) 300 MG tablet, Take 300 mg by mouth daily. , Disp: , Rfl:  .  amiodarone (PACERONE) 200 MG tablet, Take 1 tablet (200 mg total) by mouth daily., Disp: 30 tablet, Rfl: 6 .  atorvastatin (LIPITOR) 40 MG tablet, Take 40 mg by mouth daily., Disp: , Rfl:  .  cholecalciferol (VITAMIN D3) 25 MCG (1000 UT) tablet, Take 1,000 Units by mouth daily., Disp: , Rfl:  .  digoxin (LANOXIN) 0.125 MG tablet, Take 1 tablet (0.125 mg total) by mouth daily., Disp: 30 tablet, Rfl: 6 .  guaiFENesin (MUCINEX) 600 MG 12 hr tablet, Take 1 tablet (600 mg total) by mouth 2 (two) times daily., Disp: 30 tablet, Rfl: 0 .  Insulin Isophane & Regular  Human (NOVOLIN 70/30 FLEXPEN RELION) (70-30) 100 UNIT/ML PEN, Inject 40 Units into the skin See admin instructions. Inject 40 units twice a day.,, Disp: 15 mL, Rfl: 11 .  Multiple Vitamins-Minerals (MULTIVITAMIN PO), Take 1 tablet by mouth daily., Disp: , Rfl:  .  sacubitril-valsartan (ENTRESTO) 24-26 MG, Take 1 tablet by mouth 2 (two) times daily., Disp: 60 tablet, Rfl: 1 .  senna-docusate (SENOKOT-S) 8.6-50 MG tablet, Take 2 tablets by mouth 2 (two) times daily., Disp: 30 tablet, Rfl: 0 .  spironolactone (ALDACTONE) 25 MG tablet, Take 1 tablet (25 mg total) by mouth daily., Disp: 30 tablet, Rfl: 3 .  torsemide (DEMADEX) 20 MG tablet, Take 4 tablets (80 mg total) by mouth 2 (two) times daily., Disp: 120 tablet, Rfl: 3 .  traMADol (ULTRAM) 50 MG tablet, Take 50 mg by mouth at bedtime., Disp: , Rfl:  .  warfarin (COUMADIN) 5 MG tablet, Take 5 mg daily, Disp:  , Rfl:  .  predniSONE (DELTASONE) 20 MG tablet, Take 2 tablets (40 mg total) by mouth daily with breakfast. (Patient not taking: Reported on 03/01/2019), Disp: 4 tablet, Rfl: 0 Allergies  Allergen Reactions  . Actos [Pioglitazone] Other (See Comments)    Made the patient retain fluid      Social History   Socioeconomic History  . Marital status: Divorced    Spouse name: Not on file  . Number of children: Not on file  . Years of education: Not on file  .  Highest education level: Not on file  Occupational History  . Occupation: Unemployed Research scientist (physical sciences): [employer]  Tobacco Use  . Smoking status: Never Smoker  . Smokeless tobacco: Never Used  Substance and Sexual Activity  . Alcohol use: No  . Drug use: No  . Sexual activity: Never  Other Topics Concern  . Not on file  Social History Narrative   Lives alone.   Social Determinants of Health   Financial Resource Strain:   . Difficulty of Paying Living Expenses: Not on file  Food Insecurity:   . Worried About Charity fundraiser in the Last Year: Not on  file  . Ran Out of Food in the Last Year: Not on file  Transportation Needs:   . Lack of Transportation (Medical): Not on file  . Lack of Transportation (Non-Medical): Not on file  Physical Activity:   . Days of Exercise per Week: Not on file  . Minutes of Exercise per Session: Not on file  Stress:   . Feeling of Stress : Not on file  Social Connections:   . Frequency of Communication with Friends and Family: Not on file  . Frequency of Social Gatherings with Friends and Family: Not on file  . Attends Religious Services: Not on file  . Active Member of Clubs or Organizations: Not on file  . Attends Archivist Meetings: Not on file  . Marital Status: Not on file  Intimate Partner Violence:   . Fear of Current or Ex-Partner: Not on file  . Emotionally Abused: Not on file  . Physically Abused: Not on file  . Sexually Abused: Not on file    Physical Exam Cardiovascular:     Rate and Rhythm: Normal rate and regular rhythm.     Pulses: Normal pulses.  Pulmonary:     Effort: Pulmonary effort is normal.     Breath sounds: Normal breath sounds.  Musculoskeletal:        General: Normal range of motion.     Right lower leg: No edema.     Left lower leg: No edema.  Skin:    General: Skin is warm and dry.     Capillary Refill: Capillary refill takes less than 2 seconds.  Neurological:     Mental Status: He is alert and oriented to person, place, and time.  Psychiatric:        Mood and Affect: Mood normal.         Future Appointments  Date Time Provider [place]  03/12/2019  9:30 AM [facility] PA/NP [facility] None    BP (!) 117/59 (BP Location: Left Arm, Patient Position: Sitting, Cuff Size: Normal)   Pulse 80   Resp 18   Wt (!) 302 lb (137 kg)   SpO2 95%   BMI 47.30 kg/m   Weight yesterday- Did not weigh Last visit weight- 303 lb  Mr Saldanha was seen at home today and reported feeling well. He denied chest pain, SOB, headache, dizziness, orthopnea,  fever or cough over the past week. He reported being compliant with his medications over the past week and his weight has been stable. His medications were verified and his pillbox was refilled. I will follow up next week.   Jacquiline Doe, EMT 03/01/19  ACTION: Home visit completed Next visit planned for 1 week

## 2019-03-08 ENCOUNTER — Telehealth (HOSPITAL_COMMUNITY): Payer: Self-pay

## 2019-03-08 ENCOUNTER — Other Ambulatory Visit (HOSPITAL_COMMUNITY): Payer: Self-pay

## 2019-03-08 NOTE — Progress Notes (Signed)
Paramedicine Encounter    Patient ID: Raymond Pratt, male    DOB: 1950/07/04, 68 y.o.   MRN: [id number]   Patient Care Team: Shon Baton, MD as PCP - General (Internal Medicine) Haroldine Laws, Shaune Pascal, MD as PCP - Cardiology (Cardiology)  Patient Active Problem List   Diagnosis Date Noted  . CHF (congestive heart failure) ([place]) 12/05/2018  . Anemia   . Gram-negative bacteremia 02/12/2014  . Chills (without fever) 02/11/2014  . Acute liver failure 12/28/2013  . Acute on chronic heart failure ([place]) 12/28/2013  . A-fib ([place]) 03/07/2013  . HTN (hypertension) 02/16/2013  . Chronic combined systolic and diastolic heart failure ([place]) 02/01/2013  . Anticoagulated on Coumadin 01/15/2013  . Hyposmolality and/or hyponatremia 03/04/2012  . OSA on CPAP 03/04/2012  . Acute on chronic combined systolic and diastolic heart failure, NYHA class 4 ([place]) 02/28/2012  . Chest pressure 02/28/2012  . Type 2 diabetes mellitus with hyperlipidemia ([place]) 08/04/2008  . ATRIAL FIBRILLATION WITH RAPID VENTRICULAR RESPONSE 08/04/2008  . UTI 08/04/2008  . CELLULITIS, LEGS 08/04/2008  . OTHER ASCITES 08/04/2008    Current Outpatient Medications:  .  acetaminophen (TYLENOL) 325 MG tablet, Take 650 mg by mouth every 6 (six) hours as needed for mild pain., Disp: , Rfl:  .  allopurinol (ZYLOPRIM) 300 MG tablet, Take 300 mg by mouth daily. , Disp: , Rfl:  .  amiodarone (PACERONE) 200 MG tablet, Take 1 tablet (200 mg total) by mouth daily., Disp: 30 tablet, Rfl: 6 .  atorvastatin (LIPITOR) 40 MG tablet, Take 40 mg by mouth daily., Disp: , Rfl:  .  cholecalciferol (VITAMIN D3) 25 MCG (1000 UT) tablet, Take 1,000 Units by mouth daily., Disp: , Rfl:  .  digoxin (LANOXIN) 0.125 MG tablet, Take 1 tablet (0.125 mg total) by mouth daily., Disp: 30 tablet, Rfl: 6 .  guaiFENesin (MUCINEX) 600 MG 12 hr tablet, Take 1 tablet (600 mg total) by mouth 2 (two) times daily., Disp: 30 tablet, Rfl: 0 .  Insulin Isophane & Regular  Human (NOVOLIN 70/30 FLEXPEN RELION) (70-30) 100 UNIT/ML PEN, Inject 40 Units into the skin See admin instructions. Inject 40 units twice a day.,, Disp: 15 mL, Rfl: 11 .  Multiple Vitamins-Minerals (MULTIVITAMIN PO), Take 1 tablet by mouth daily., Disp: , Rfl:  .  sacubitril-valsartan (ENTRESTO) 24-26 MG, Take 1 tablet by mouth 2 (two) times daily., Disp: 60 tablet, Rfl: 1 .  senna-docusate (SENOKOT-S) 8.6-50 MG tablet, Take 2 tablets by mouth 2 (two) times daily., Disp: 30 tablet, Rfl: 0 .  spironolactone (ALDACTONE) 25 MG tablet, Take 1 tablet (25 mg total) by mouth daily., Disp: 30 tablet, Rfl: 3 .  torsemide (DEMADEX) 20 MG tablet, Take 4 tablets (80 mg total) by mouth 2 (two) times daily., Disp: 120 tablet, Rfl: 3 .  traMADol (ULTRAM) 50 MG tablet, Take 50 mg by mouth at bedtime., Disp: , Rfl:  .  warfarin (COUMADIN) 5 MG tablet, Take 5 mg daily, Disp:  , Rfl:  .  predniSONE (DELTASONE) 20 MG tablet, Take 2 tablets (40 mg total) by mouth daily with breakfast. (Patient not taking: Reported on 03/01/2019), Disp: 4 tablet, Rfl: 0 Allergies  Allergen Reactions  . Actos [Pioglitazone] Other (See Comments)    Made the patient retain fluid      Social History   Socioeconomic History  . Marital status: Divorced    Spouse name: Not on file  . Number of children: Not on file  . Years of education: Not on file  .  Highest education level: Not on file  Occupational History  . Occupation: Unemployed Research scientist (physical sciences): [employer]  Tobacco Use  . Smoking status: Never Smoker  . Smokeless tobacco: Never Used  Substance and Sexual Activity  . Alcohol use: No  . Drug use: No  . Sexual activity: Never  Other Topics Concern  . Not on file  Social History Narrative   Lives alone.   Social Determinants of Health   Financial Resource Strain:   . Difficulty of Paying Living Expenses: Not on file  Food Insecurity:   . Worried About Charity fundraiser in the Last Year: Not on  file  . Ran Out of Food in the Last Year: Not on file  Transportation Needs:   . Lack of Transportation (Medical): Not on file  . Lack of Transportation (Non-Medical): Not on file  Physical Activity:   . Days of Exercise per Week: Not on file  . Minutes of Exercise per Session: Not on file  Stress:   . Feeling of Stress : Not on file  Social Connections:   . Frequency of Communication with Friends and Family: Not on file  . Frequency of Social Gatherings with Friends and Family: Not on file  . Attends Religious Services: Not on file  . Active Member of Clubs or Organizations: Not on file  . Attends Archivist Meetings: Not on file  . Marital Status: Not on file  Intimate Partner Violence:   . Fear of Current or Ex-Partner: Not on file  . Emotionally Abused: Not on file  . Physically Abused: Not on file  . Sexually Abused: Not on file    Physical Exam Cardiovascular:     Rate and Rhythm: Normal rate and regular rhythm.     Pulses: Normal pulses.  Pulmonary:     Effort: Pulmonary effort is normal.     Breath sounds: Normal breath sounds.  Musculoskeletal:        General: Normal range of motion.     Right lower leg: No edema.     Left lower leg: No edema.  Skin:    General: Skin is warm and dry.     Capillary Refill: Capillary refill takes less than 2 seconds.  Neurological:     Mental Status: He is alert and oriented to person, place, and time.  Psychiatric:        Mood and Affect: Mood normal.         Future Appointments  Date Time Provider [place]  03/12/2019  9:30 AM [facility] PA/NP [facility] None    BP 122/64 (BP Location: Left Arm, Patient Position: Sitting, Cuff Size: Normal)   Pulse 90   Resp 16   Wt (!) 307 lb (139.3 kg)   SpO2 97%   BMI 48.08 kg/m   Weight yesterday- did not weigh Last visit weight- 302 lb  Raymond Pratt was seen at home today and reported feeling well with the exception of leg pain secondary to gout. He denied chest  pain, SOB, headache, dizziness, orthopnea, fever or cough since our last visit. He stated he has been compliant with his medications but his weight has increased 5 lb since last week. I contacted the HF clinic and was advised to have him take 5 mg of metolazone tomorrow and they will check blood work next week. His medications were verified and his pillbox was refilled. I will follow up next week.   Jacquiline Doe, EMT 03/08/19  ACTION: Home visit completed Next visit planned for 1 week

## 2019-03-08 NOTE — Telephone Encounter (Signed)
Raymond Pratt with paramedicine called while at the patients home to report a 5 pound weight gain for patient. Raymond Pratt also wanted to know if it was ok for the patient to take metolazone that he had received from his pcp.    Discussed with Dr. [name] and he advised both [name] and I  that it was ok for the patient to take 1 tablet of the metolazone 5mg  today.

## 2019-03-12 ENCOUNTER — Telehealth (HOSPITAL_COMMUNITY): Payer: Self-pay | Admitting: Licensed Clinical Social Worker

## 2019-03-12 ENCOUNTER — Encounter (HOSPITAL_COMMUNITY): Payer: Medicare HMO

## 2019-03-12 NOTE — Telephone Encounter (Signed)
CSW received referral to follow up with patient regarding no show today. CSW informed that patient had expressed concerns about ability to afford co-pay for speciality offices and possible reason for no show. CSW left message for return call. Raquel Sarna, [place], [place]

## 2019-03-15 ENCOUNTER — Telehealth (HOSPITAL_COMMUNITY): Payer: Self-pay

## 2019-03-15 NOTE — Telephone Encounter (Signed)
I called Raymond Pratt to see if he was available for a visit. He did not answer so I left a message requesting he call me back.   Jacquiline Doe, EMT 03/15/19

## 2019-03-16 ENCOUNTER — Telehealth (HOSPITAL_COMMUNITY): Payer: Self-pay

## 2019-03-16 NOTE — Telephone Encounter (Signed)
I called Ms Serven to schedule an appointment. He did not answer so I left a message requesting he call me back.   Jacquiline Doe, EMT 03/16/19

## 2019-03-22 ENCOUNTER — Telehealth (HOSPITAL_COMMUNITY): Payer: Self-pay

## 2019-03-23 ENCOUNTER — Telehealth (HOSPITAL_COMMUNITY): Payer: Self-pay

## 2019-03-23 NOTE — Telephone Encounter (Signed)
I called Ms Raymond Pratt to try and reach Mr Raymond Pratt after several attempts of trying to reach him without success. She did not answer so I left a message with my information and requested she call me back.   Raymond Pratt, EMT 03/23/19

## 2019-03-23 NOTE — Telephone Encounter (Signed)
I called Mr Tullos to schedule an appointment. He did not answer so I left a message requesting he call me back.   Jacquiline Doe, EMT 03/23/19

## 2019-03-23 NOTE — Telephone Encounter (Signed)
I called Raymond Pratt to schedule an appointment. He did not answer so I left a message requesting that he call me back.   Jacquiline Doe, EMT

## 2019-04-05 ENCOUNTER — Telehealth (HOSPITAL_COMMUNITY): Payer: Self-pay

## 2019-04-05 NOTE — Telephone Encounter (Signed)
I called Raymond Pratt to schedule an appointment. He did not answer so I left a message requesting he call me when he is able.   Jacquiline Doe, EMT 04/05/19

## 2019-04-17 ENCOUNTER — Telehealth (HOSPITAL_COMMUNITY): Payer: Self-pay | Admitting: Licensed Clinical Social Worker

## 2019-04-17 NOTE — Telephone Encounter (Signed)
CSW informed by [employer] that they have been unable to get a hold of patient for several weeks now despite multiple attempts and messages.  CSW also attempted to reach today with no success.  Patient being discharged from [facility] program at this time- pt can be reconsidered if gets back in contact with [place] and is able to complete another clinic as he canceled his last appt in January and has not rescheduled.  Jorge Ny, LCSW Clinical Social Worker [facility] Desk#: [phone] Cell#: [phone]

## 2019-04-23 DIAGNOSIS — S0592XD Unspecified injury of left eye and orbit, subsequent encounter: Secondary | ICD-10-CM | POA: Diagnosis not present

## 2019-04-23 DIAGNOSIS — H4032X0 Glaucoma secondary to eye trauma, left eye, stage unspecified: Secondary | ICD-10-CM | POA: Diagnosis not present

## 2019-04-23 DIAGNOSIS — E113411 Type 2 diabetes mellitus with severe nonproliferative diabetic retinopathy with macular edema, right eye: Secondary | ICD-10-CM | POA: Diagnosis not present

## 2019-04-23 DIAGNOSIS — H35372 Puckering of macula, left eye: Secondary | ICD-10-CM | POA: Diagnosis not present

## 2019-04-23 DIAGNOSIS — H25811 Combined forms of age-related cataract, right eye: Secondary | ICD-10-CM | POA: Diagnosis not present

## 2019-04-23 DIAGNOSIS — H527 Unspecified disorder of refraction: Secondary | ICD-10-CM | POA: Diagnosis not present

## 2019-04-23 DIAGNOSIS — H3561 Retinal hemorrhage, right eye: Secondary | ICD-10-CM | POA: Diagnosis not present

## 2019-04-23 DIAGNOSIS — Z961 Presence of intraocular lens: Secondary | ICD-10-CM | POA: Diagnosis not present

## 2019-04-24 DIAGNOSIS — H35372 Puckering of macula, left eye: Secondary | ICD-10-CM | POA: Diagnosis not present

## 2019-04-24 DIAGNOSIS — H34811 Central retinal vein occlusion, right eye, with macular edema: Secondary | ICD-10-CM | POA: Diagnosis not present

## 2019-04-24 DIAGNOSIS — E113312 Type 2 diabetes mellitus with moderate nonproliferative diabetic retinopathy with macular edema, left eye: Secondary | ICD-10-CM | POA: Diagnosis not present

## 2019-04-24 DIAGNOSIS — E113411 Type 2 diabetes mellitus with severe nonproliferative diabetic retinopathy with macular edema, right eye: Secondary | ICD-10-CM | POA: Diagnosis not present

## 2019-04-27 ENCOUNTER — Other Ambulatory Visit (HOSPITAL_COMMUNITY): Payer: Self-pay | Admitting: Internal Medicine

## 2019-05-15 DIAGNOSIS — E113312 Type 2 diabetes mellitus with moderate nonproliferative diabetic retinopathy with macular edema, left eye: Secondary | ICD-10-CM | POA: Diagnosis not present

## 2019-05-15 DIAGNOSIS — E113411 Type 2 diabetes mellitus with severe nonproliferative diabetic retinopathy with macular edema, right eye: Secondary | ICD-10-CM | POA: Diagnosis not present

## 2019-05-15 DIAGNOSIS — H35372 Puckering of macula, left eye: Secondary | ICD-10-CM | POA: Diagnosis not present

## 2019-05-22 DIAGNOSIS — E113411 Type 2 diabetes mellitus with severe nonproliferative diabetic retinopathy with macular edema, right eye: Secondary | ICD-10-CM | POA: Diagnosis not present

## 2019-06-18 ENCOUNTER — Other Ambulatory Visit (HOSPITAL_COMMUNITY): Payer: Self-pay | Admitting: Internal Medicine

## 2019-06-19 DIAGNOSIS — E113312 Type 2 diabetes mellitus with moderate nonproliferative diabetic retinopathy with macular edema, left eye: Secondary | ICD-10-CM | POA: Diagnosis not present

## 2019-06-19 DIAGNOSIS — E113411 Type 2 diabetes mellitus with severe nonproliferative diabetic retinopathy with macular edema, right eye: Secondary | ICD-10-CM | POA: Diagnosis not present

## 2019-07-09 DIAGNOSIS — Z79899 Other long term (current) drug therapy: Secondary | ICD-10-CM | POA: Diagnosis not present

## 2019-07-09 DIAGNOSIS — Z7901 Long term (current) use of anticoagulants: Secondary | ICD-10-CM | POA: Diagnosis not present

## 2019-07-09 DIAGNOSIS — K0889 Other specified disorders of teeth and supporting structures: Secondary | ICD-10-CM | POA: Diagnosis not present

## 2019-07-09 DIAGNOSIS — I509 Heart failure, unspecified: Secondary | ICD-10-CM | POA: Diagnosis not present

## 2019-07-09 DIAGNOSIS — Z87891 Personal history of nicotine dependence: Secondary | ICD-10-CM | POA: Diagnosis not present

## 2019-07-09 DIAGNOSIS — K047 Periapical abscess without sinus: Secondary | ICD-10-CM | POA: Diagnosis not present

## 2019-07-09 DIAGNOSIS — Z794 Long term (current) use of insulin: Secondary | ICD-10-CM | POA: Diagnosis not present

## 2019-07-17 DIAGNOSIS — H35372 Puckering of macula, left eye: Secondary | ICD-10-CM | POA: Diagnosis not present

## 2019-07-17 DIAGNOSIS — E113312 Type 2 diabetes mellitus with moderate nonproliferative diabetic retinopathy with macular edema, left eye: Secondary | ICD-10-CM | POA: Diagnosis not present

## 2019-07-17 DIAGNOSIS — E113411 Type 2 diabetes mellitus with severe nonproliferative diabetic retinopathy with macular edema, right eye: Secondary | ICD-10-CM | POA: Diagnosis not present

## 2019-08-13 ENCOUNTER — Other Ambulatory Visit (HOSPITAL_COMMUNITY): Payer: Self-pay | Admitting: Internal Medicine

## 2019-08-13 DIAGNOSIS — I480 Paroxysmal atrial fibrillation: Secondary | ICD-10-CM

## 2019-08-13 DIAGNOSIS — I5042 Chronic combined systolic (congestive) and diastolic (congestive) heart failure: Secondary | ICD-10-CM

## 2019-08-18 ENCOUNTER — Other Ambulatory Visit (HOSPITAL_COMMUNITY): Payer: Self-pay | Admitting: Internal Medicine

## 2019-10-03 ENCOUNTER — Other Ambulatory Visit (HOSPITAL_COMMUNITY): Payer: Self-pay | Admitting: Internal Medicine

## 2019-10-10 DIAGNOSIS — Z7901 Long term (current) use of anticoagulants: Secondary | ICD-10-CM | POA: Diagnosis not present

## 2019-10-10 DIAGNOSIS — I480 Paroxysmal atrial fibrillation: Secondary | ICD-10-CM | POA: Diagnosis not present

## 2019-10-24 ENCOUNTER — Other Ambulatory Visit (HOSPITAL_COMMUNITY): Payer: Self-pay | Admitting: Internal Medicine

## 2019-10-24 DIAGNOSIS — I5042 Chronic combined systolic (congestive) and diastolic (congestive) heart failure: Secondary | ICD-10-CM

## 2019-11-05 DIAGNOSIS — E113411 Type 2 diabetes mellitus with severe nonproliferative diabetic retinopathy with macular edema, right eye: Secondary | ICD-10-CM | POA: Diagnosis not present

## 2019-11-05 DIAGNOSIS — E113312 Type 2 diabetes mellitus with moderate nonproliferative diabetic retinopathy with macular edema, left eye: Secondary | ICD-10-CM | POA: Diagnosis not present

## 2019-11-05 DIAGNOSIS — H35372 Puckering of macula, left eye: Secondary | ICD-10-CM | POA: Diagnosis not present

## 2019-11-06 ENCOUNTER — Encounter (HOSPITAL_COMMUNITY): Payer: Self-pay

## 2019-11-06 ENCOUNTER — Emergency Department (HOSPITAL_COMMUNITY): Payer: Medicare HMO

## 2019-11-06 ENCOUNTER — Emergency Department (HOSPITAL_COMMUNITY)
Admission: EM | Admit: 2019-11-06 | Discharge: 2019-11-06 | Disposition: A | Discharge status: Home / Self Care | Payer: Medicare HMO | Attending: Emergency Medicine | Admitting: Emergency Medicine

## 2019-11-06 DIAGNOSIS — Z7901 Long term (current) use of anticoagulants: Secondary | ICD-10-CM | POA: Insufficient documentation

## 2019-11-06 DIAGNOSIS — I5042 Chronic combined systolic (congestive) and diastolic (congestive) heart failure: Secondary | ICD-10-CM | POA: Insufficient documentation

## 2019-11-06 DIAGNOSIS — R509 Fever, unspecified: Secondary | ICD-10-CM | POA: Diagnosis not present

## 2019-11-06 DIAGNOSIS — R000 Tachycardia, unspecified: Secondary | ICD-10-CM | POA: Diagnosis not present

## 2019-11-06 DIAGNOSIS — I110 Hypertensive heart disease with heart failure: Secondary | ICD-10-CM | POA: Insufficient documentation

## 2019-11-06 DIAGNOSIS — K047 Periapical abscess without sinus: Secondary | ICD-10-CM | POA: Diagnosis not present

## 2019-11-06 DIAGNOSIS — I130 Hypertensive heart and chronic kidney disease with heart failure and stage 1 through stage 4 chronic kidney disease, or unspecified chronic kidney disease: Secondary | ICD-10-CM | POA: Diagnosis not present

## 2019-11-06 DIAGNOSIS — K117 Disturbances of salivary secretion: Secondary | ICD-10-CM | POA: Diagnosis not present

## 2019-11-06 DIAGNOSIS — R52 Pain, unspecified: Secondary | ICD-10-CM | POA: Diagnosis not present

## 2019-11-06 DIAGNOSIS — I2510 Atherosclerotic heart disease of native coronary artery without angina pectoris: Secondary | ICD-10-CM | POA: Insufficient documentation

## 2019-11-06 DIAGNOSIS — M542 Cervicalgia: Secondary | ICD-10-CM | POA: Diagnosis not present

## 2019-11-06 DIAGNOSIS — N1832 Chronic kidney disease, stage 3b: Secondary | ICD-10-CM | POA: Diagnosis not present

## 2019-11-06 DIAGNOSIS — K1120 Sialoadenitis, unspecified: Secondary | ICD-10-CM | POA: Diagnosis not present

## 2019-11-06 DIAGNOSIS — L0201 Cutaneous abscess of face: Secondary | ICD-10-CM | POA: Diagnosis not present

## 2019-11-06 DIAGNOSIS — R609 Edema, unspecified: Secondary | ICD-10-CM | POA: Diagnosis not present

## 2019-11-06 DIAGNOSIS — Z79899 Other long term (current) drug therapy: Secondary | ICD-10-CM | POA: Diagnosis not present

## 2019-11-06 DIAGNOSIS — I6529 Occlusion and stenosis of unspecified carotid artery: Secondary | ICD-10-CM | POA: Diagnosis not present

## 2019-11-06 DIAGNOSIS — E1165 Type 2 diabetes mellitus with hyperglycemia: Secondary | ICD-10-CM | POA: Diagnosis not present

## 2019-11-06 DIAGNOSIS — E119 Type 2 diabetes mellitus without complications: Secondary | ICD-10-CM | POA: Diagnosis not present

## 2019-11-06 DIAGNOSIS — R0603 Acute respiratory distress: Secondary | ICD-10-CM | POA: Diagnosis not present

## 2019-11-06 DIAGNOSIS — R0902 Hypoxemia: Secondary | ICD-10-CM | POA: Diagnosis not present

## 2019-11-06 DIAGNOSIS — R6884 Jaw pain: Secondary | ICD-10-CM | POA: Diagnosis not present

## 2019-11-06 DIAGNOSIS — K118 Other diseases of salivary glands: Secondary | ICD-10-CM | POA: Diagnosis not present

## 2019-11-06 DIAGNOSIS — K0889 Other specified disorders of teeth and supporting structures: Secondary | ICD-10-CM | POA: Diagnosis present

## 2019-11-06 DIAGNOSIS — I480 Paroxysmal atrial fibrillation: Secondary | ICD-10-CM | POA: Diagnosis not present

## 2019-11-06 DIAGNOSIS — K029 Dental caries, unspecified: Secondary | ICD-10-CM | POA: Diagnosis not present

## 2019-11-06 LAB — CBC WITH DIFFERENTIAL/PLATELET
Abs Immature Granulocytes: 0.08 10*3/uL — ABNORMAL HIGH (ref 0.00–0.07)
Basophils Absolute: 0.1 10*3/uL (ref 0.0–0.1)
Basophils Relative: 0 %
Eosinophils Absolute: 0.1 10*3/uL (ref 0.0–0.5)
Eosinophils Relative: 1 %
HCT: 34.5 % — ABNORMAL LOW (ref 39.0–52.0)
Hemoglobin: 10 g/dL — ABNORMAL LOW (ref 13.0–17.0)
Immature Granulocytes: 1 %
Lymphocytes Relative: 6 %
Lymphs Abs: 1 10*3/uL (ref 0.7–4.0)
MCH: 25.2 pg — ABNORMAL LOW (ref 26.0–34.0)
MCHC: 29 g/dL — ABNORMAL LOW (ref 30.0–36.0)
MCV: 86.9 fL (ref 80.0–100.0)
Monocytes Absolute: 1.6 10*3/uL — ABNORMAL HIGH (ref 0.1–1.0)
Monocytes Relative: 10 %
Neutro Abs: 13.7 10*3/uL — ABNORMAL HIGH (ref 1.7–7.7)
Neutrophils Relative %: 82 %
Platelets: 466 10*3/uL — ABNORMAL HIGH (ref 150–400)
RBC: 3.97 MIL/uL — ABNORMAL LOW (ref 4.22–5.81)
RDW: 21.2 % — ABNORMAL HIGH (ref 11.5–15.5)
WBC: 16.5 10*3/uL — ABNORMAL HIGH (ref 4.0–10.5)
nRBC: 0 % (ref 0.0–0.2)

## 2019-11-06 LAB — BASIC METABOLIC PANEL
Anion gap: 9 (ref 5–15)
BUN: 28 mg/dL — ABNORMAL HIGH (ref 8–23)
CO2: 32 mmol/L (ref 22–32)
Calcium: 8.6 mg/dL — ABNORMAL LOW (ref 8.9–10.3)
Chloride: 94 mmol/L — ABNORMAL LOW (ref 98–111)
Creatinine, Ser: 1.58 mg/dL — ABNORMAL HIGH (ref 0.61–1.24)
GFR calc Af Amer: 52 mL/min — ABNORMAL LOW (ref >60)
GFR calc non Af Amer: 45 mL/min — ABNORMAL LOW (ref >60)
Glucose, Bld: 224 mg/dL — ABNORMAL HIGH (ref 70–99)
Potassium: 4.2 mmol/L (ref 3.5–5.1)
Sodium: 135 mmol/L (ref 135–145)

## 2019-11-06 LAB — LACTIC ACID, PLASMA
Lactic Acid, Venous: 1.4 mmol/L (ref 0.5–1.9)
Lactic Acid, Venous: 1.6 mmol/L (ref 0.5–1.9)

## 2019-11-06 LAB — I-STAT CHEM 8, ED
BUN: 33 mg/dL — ABNORMAL HIGH (ref 8–23)
Calcium, Ion: 1.01 mmol/L — ABNORMAL LOW (ref 1.15–1.40)
Chloride: 94 mmol/L — ABNORMAL LOW (ref 98–111)
Creatinine, Ser: 1.4 mg/dL — ABNORMAL HIGH (ref 0.61–1.24)
Glucose, Bld: 227 mg/dL — ABNORMAL HIGH (ref 70–99)
HCT: 34 % — ABNORMAL LOW (ref 39.0–52.0)
Hemoglobin: 11.6 g/dL — ABNORMAL LOW (ref 13.0–17.0)
Potassium: 4.2 mmol/L (ref 3.5–5.1)
Sodium: 136 mmol/L (ref 135–145)
TCO2: 32 mmol/L (ref 22–32)

## 2019-11-06 MED ORDER — IOHEXOL 300 MG/ML  SOLN
75.0000 mL | Freq: Once | INTRAMUSCULAR | Status: AC | PRN
  Administered 2019-11-06: 75 mL via INTRAVENOUS

## 2019-11-06 MED ORDER — LIDOCAINE-EPINEPHRINE 1 %-1:100000 IJ SOLN
20.0000 mL | Freq: Once | INTRAMUSCULAR | Status: AC
  Administered 2019-11-06: 20 mL
  Filled 2019-11-06: qty 1

## 2019-11-06 MED ORDER — CLINDAMYCIN PHOSPHATE 600 MG/50ML IV SOLN
600.0000 mg | Freq: Once | INTRAVENOUS | Status: AC
  Administered 2019-11-06: 600 mg via INTRAVENOUS
  Filled 2019-11-06: qty 50

## 2019-11-06 MED ORDER — OXYCODONE-ACETAMINOPHEN 5-325 MG PO TABS
1.0000 | ORAL_TABLET | Freq: Once | ORAL | Status: AC
  Administered 2019-11-06: 1 via ORAL
  Filled 2019-11-06: qty 1

## 2019-11-06 MED ORDER — CLINDAMYCIN HCL 300 MG PO CAPS: 300.0000 mg | ORAL_CAPSULE | Freq: Four times a day (QID) | ORAL | 0 refills | Status: DC

## 2019-11-06 NOTE — ED Triage Notes (Signed)
Pt reports right sided facial swelling along with dental/ear pain. Has not seen a dentist in a while.

## 2019-11-06 NOTE — ED Notes (Signed)
Patient has verbalized understanding of DC instructions, Rx, follow up care with dentistry. Has called granddaughter for ride home
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
## 2019-11-06 NOTE — Discharge Instructions (Addendum)
You were seen in the emergency department for evaluation of facial swelling and dental pain.  You had a CAT scan that showed you have a extensive dental abscess.  We incised and drained the area and gave you a dose of IV antibiotics.  Please finish the oral antibiotics and call Dr. Radford Pax dentistry tomorrow for close follow-up.  If you have increased swelling or any difficulty swallowing or speaking return to the emergency department.

## 2019-11-06 NOTE — ED Triage Notes (Signed)
Emergency Medicine Provider Triage Evaluation Note  Raymond Pratt , a 68 y.o. male  was evaluated in triage.  Pt complains of right lower dental pain with right sided facial swelling x 3 days. Pt reports he is still able to tolerate food and drink however the swelling has gotten progressively worse prompting him to come to the ED. I was asked to evaluate patient by triage nursing staff due to amount of swelling with question for need for CT scan.   Review of Systems  Positive: + dental pain, + facial swelling Negative: - Shortness of breath  Physical Exam  BP 123/64 (BP Location: Left Arm)   Pulse (!) 110   Temp 99.9 F (37.7 C) (Oral)   Resp (!) 22   Ht 5\' 8"  (1.727 m)   Wt 127 kg   SpO2 91%   BMI 42.57 kg/m  Gen:   Awake, no distress   HEENT:  Obvious right sided facial swelling with extension into the submandibular space; area is erythematous, edematous, with increased warmth. Unable to fully assess oral cavity due to amount of swelling.  Resp:  Normal effort  Cardiac:  Tachycardic Abd:   Nondistended, nontender  MSK:   Moves extremities without difficulty  Neuro:  Speech clear   Medical Decision Making  Medically screening exam initiated at 1:43 PM.  Appropriate orders placed.  Dontavius Keim was informed that the remainder of the evaluation will be completed by another provider, this initial triage assessment does not replace that evaluation, and the importance of remaining in the ED until their evaluation is complete.  Clinical Impression  69 year old male presenting to the ED today with right lower dental pain and facial swelling x 3 days. On arrival to the ED pt's temp is 99.9. He is tachycardic in the 110's and tachypneic. Have ordered CT soft tissue neck due to amount of swelling; I stat chem 8 ordered prior as well as CBC, BMP, and lactic acid.    Eustaquio Maize, PA-C 11/06/19 1347

## 2019-11-19 DIAGNOSIS — K047 Periapical abscess without sinus: Secondary | ICD-10-CM | POA: Diagnosis not present

## 2019-11-19 DIAGNOSIS — I5042 Chronic combined systolic (congestive) and diastolic (congestive) heart failure: Secondary | ICD-10-CM | POA: Diagnosis not present

## 2019-11-19 DIAGNOSIS — I130 Hypertensive heart and chronic kidney disease with heart failure and stage 1 through stage 4 chronic kidney disease, or unspecified chronic kidney disease: Secondary | ICD-10-CM | POA: Diagnosis not present

## 2019-11-19 DIAGNOSIS — Z7901 Long term (current) use of anticoagulants: Secondary | ICD-10-CM | POA: Diagnosis not present

## 2019-11-19 DIAGNOSIS — I480 Paroxysmal atrial fibrillation: Secondary | ICD-10-CM | POA: Diagnosis not present

## 2019-11-28 ENCOUNTER — Other Ambulatory Visit (HOSPITAL_COMMUNITY): Payer: Self-pay | Admitting: Cardiology

## 2019-12-01 ENCOUNTER — Other Ambulatory Visit (HOSPITAL_COMMUNITY): Payer: Self-pay | Admitting: Internal Medicine

## 2019-12-09 ENCOUNTER — Telehealth: Payer: Self-pay | Admitting: Internal Medicine

## 2019-12-09 ENCOUNTER — Other Ambulatory Visit: Payer: Self-pay | Admitting: Physician Assistant

## 2019-12-09 ENCOUNTER — Other Ambulatory Visit: Payer: Self-pay

## 2019-12-09 DIAGNOSIS — I4821 Permanent atrial fibrillation: Secondary | ICD-10-CM | POA: Diagnosis present

## 2019-12-09 DIAGNOSIS — K264 Chronic or unspecified duodenal ulcer with hemorrhage: Secondary | ICD-10-CM | POA: Diagnosis present

## 2019-12-09 DIAGNOSIS — K3189 Other diseases of stomach and duodenum: Secondary | ICD-10-CM | POA: Diagnosis not present

## 2019-12-09 DIAGNOSIS — Z794 Long term (current) use of insulin: Secondary | ICD-10-CM

## 2019-12-09 DIAGNOSIS — I428 Other cardiomyopathies: Secondary | ICD-10-CM | POA: Diagnosis present

## 2019-12-09 DIAGNOSIS — E1169 Type 2 diabetes mellitus with other specified complication: Secondary | ICD-10-CM | POA: Diagnosis present

## 2019-12-09 DIAGNOSIS — R791 Abnormal coagulation profile: Secondary | ICD-10-CM | POA: Diagnosis present

## 2019-12-09 DIAGNOSIS — G4733 Obstructive sleep apnea (adult) (pediatric): Secondary | ICD-10-CM | POA: Diagnosis present

## 2019-12-09 DIAGNOSIS — E871 Hypo-osmolality and hyponatremia: Secondary | ICD-10-CM | POA: Diagnosis present

## 2019-12-09 DIAGNOSIS — I272 Pulmonary hypertension, unspecified: Secondary | ICD-10-CM | POA: Diagnosis present

## 2019-12-09 DIAGNOSIS — K2970 Gastritis, unspecified, without bleeding: Secondary | ICD-10-CM | POA: Diagnosis present

## 2019-12-09 DIAGNOSIS — I4891 Unspecified atrial fibrillation: Secondary | ICD-10-CM | POA: Diagnosis not present

## 2019-12-09 DIAGNOSIS — D62 Acute posthemorrhagic anemia: Secondary | ICD-10-CM | POA: Diagnosis present

## 2019-12-09 DIAGNOSIS — I5043 Acute on chronic combined systolic (congestive) and diastolic (congestive) heart failure: Secondary | ICD-10-CM | POA: Diagnosis present

## 2019-12-09 DIAGNOSIS — Z6841 Body Mass Index (BMI) 40.0 and over, adult: Secondary | ICD-10-CM | POA: Diagnosis not present

## 2019-12-09 DIAGNOSIS — K921 Melena: Secondary | ICD-10-CM | POA: Diagnosis not present

## 2019-12-09 DIAGNOSIS — N1831 Chronic kidney disease, stage 3a: Secondary | ICD-10-CM | POA: Diagnosis present

## 2019-12-09 DIAGNOSIS — I2510 Atherosclerotic heart disease of native coronary artery without angina pectoris: Secondary | ICD-10-CM | POA: Diagnosis present

## 2019-12-09 DIAGNOSIS — R0902 Hypoxemia: Secondary | ICD-10-CM | POA: Diagnosis present

## 2019-12-09 DIAGNOSIS — E1122 Type 2 diabetes mellitus with diabetic chronic kidney disease: Secondary | ICD-10-CM | POA: Diagnosis present

## 2019-12-09 DIAGNOSIS — D649 Anemia, unspecified: Secondary | ICD-10-CM | POA: Diagnosis not present

## 2019-12-09 DIAGNOSIS — I480 Paroxysmal atrial fibrillation: Secondary | ICD-10-CM | POA: Diagnosis not present

## 2019-12-09 DIAGNOSIS — J90 Pleural effusion, not elsewhere classified: Secondary | ICD-10-CM | POA: Diagnosis not present

## 2019-12-09 DIAGNOSIS — N179 Acute kidney failure, unspecified: Secondary | ICD-10-CM | POA: Diagnosis present

## 2019-12-09 DIAGNOSIS — D509 Iron deficiency anemia, unspecified: Secondary | ICD-10-CM | POA: Diagnosis present

## 2019-12-09 DIAGNOSIS — Z79899 Other long term (current) drug therapy: Secondary | ICD-10-CM

## 2019-12-09 DIAGNOSIS — E785 Hyperlipidemia, unspecified: Secondary | ICD-10-CM | POA: Diagnosis present

## 2019-12-09 DIAGNOSIS — I10 Essential (primary) hypertension: Secondary | ICD-10-CM | POA: Diagnosis not present

## 2019-12-09 DIAGNOSIS — E861 Hypovolemia: Secondary | ICD-10-CM | POA: Diagnosis present

## 2019-12-09 DIAGNOSIS — I9589 Other hypotension: Secondary | ICD-10-CM | POA: Diagnosis not present

## 2019-12-09 DIAGNOSIS — Z20822 Contact with and (suspected) exposure to covid-19: Secondary | ICD-10-CM | POA: Diagnosis present

## 2019-12-09 DIAGNOSIS — Z7901 Long term (current) use of anticoagulants: Secondary | ICD-10-CM

## 2019-12-09 DIAGNOSIS — K269 Duodenal ulcer, unspecified as acute or chronic, without hemorrhage or perforation: Secondary | ICD-10-CM | POA: Diagnosis not present

## 2019-12-09 DIAGNOSIS — R579 Shock, unspecified: Secondary | ICD-10-CM | POA: Diagnosis present

## 2019-12-09 DIAGNOSIS — R531 Weakness: Secondary | ICD-10-CM | POA: Diagnosis not present

## 2019-12-09 DIAGNOSIS — I5042 Chronic combined systolic (congestive) and diastolic (congestive) heart failure: Secondary | ICD-10-CM | POA: Diagnosis not present

## 2019-12-09 DIAGNOSIS — I248 Other forms of acute ischemic heart disease: Secondary | ICD-10-CM | POA: Diagnosis present

## 2019-12-09 DIAGNOSIS — I959 Hypotension, unspecified: Secondary | ICD-10-CM | POA: Diagnosis not present

## 2019-12-09 DIAGNOSIS — Z9119 Patient's noncompliance with other medical treatment and regimen: Secondary | ICD-10-CM

## 2019-12-09 DIAGNOSIS — I130 Hypertensive heart and chronic kidney disease with heart failure and stage 1 through stage 4 chronic kidney disease, or unspecified chronic kidney disease: Secondary | ICD-10-CM | POA: Diagnosis present

## 2019-12-09 DIAGNOSIS — Z7952 Long term (current) use of systemic steroids: Secondary | ICD-10-CM

## 2019-12-09 DIAGNOSIS — R578 Other shock: Secondary | ICD-10-CM | POA: Diagnosis present

## 2019-12-09 DIAGNOSIS — Z889 Allergy status to unspecified drugs, medicaments and biological substances status: Secondary | ICD-10-CM

## 2019-12-09 DIAGNOSIS — Z79891 Long term (current) use of opiate analgesic: Secondary | ICD-10-CM

## 2019-12-09 DIAGNOSIS — J811 Chronic pulmonary edema: Secondary | ICD-10-CM | POA: Diagnosis not present

## 2019-12-09 DIAGNOSIS — Z7982 Long term (current) use of aspirin: Secondary | ICD-10-CM

## 2019-12-09 DIAGNOSIS — Z713 Dietary counseling and surveillance: Secondary | ICD-10-CM

## 2019-12-09 DIAGNOSIS — I872 Venous insufficiency (chronic) (peripheral): Secondary | ICD-10-CM | POA: Diagnosis present

## 2019-12-09 DIAGNOSIS — I517 Cardiomegaly: Secondary | ICD-10-CM | POA: Diagnosis not present

## 2019-12-09 DIAGNOSIS — R778 Other specified abnormalities of plasma proteins: Secondary | ICD-10-CM | POA: Diagnosis not present

## 2019-12-09 NOTE — Telephone Encounter (Signed)
Paged this evening by Mr. Mcanelly's daughter. She reports he feels like his heart is pounding. His feet are painful and numb and he feels like he is having a gout attack. He also has a rash on his chest and feels he has shingles. He does not feel like he is retaining more fluid. I advised Mr. Montesdeoca's daughter that he should be evaluated either in the ED or clinic as his symptoms dictate. He is frustrated that he has not been able to sleep for four days because of his feet, and I relayed that this was likely an indication he needed evaluation sooner than later.

## 2019-12-10 ENCOUNTER — Other Ambulatory Visit: Payer: Self-pay

## 2019-12-10 ENCOUNTER — Inpatient Hospital Stay (HOSPITAL_COMMUNITY)
Admission: EM | Admit: 2019-12-10 | Discharge: 2019-12-13 | DRG: 377 | Disposition: A | Discharge status: Home / Self Care | Payer: Medicare HMO | Attending: Internal Medicine | Admitting: Internal Medicine

## 2019-12-10 ENCOUNTER — Encounter (HOSPITAL_COMMUNITY): Payer: Self-pay | Admitting: Internal Medicine

## 2019-12-10 ENCOUNTER — Emergency Department (HOSPITAL_COMMUNITY): Payer: Medicare HMO

## 2019-12-10 DIAGNOSIS — R778 Other specified abnormalities of plasma proteins: Secondary | ICD-10-CM

## 2019-12-10 DIAGNOSIS — R791 Abnormal coagulation profile: Secondary | ICD-10-CM | POA: Insufficient documentation

## 2019-12-10 DIAGNOSIS — R0902 Hypoxemia: Secondary | ICD-10-CM

## 2019-12-10 DIAGNOSIS — K921 Melena: Secondary | ICD-10-CM | POA: Diagnosis not present

## 2019-12-10 DIAGNOSIS — E861 Hypovolemia: Secondary | ICD-10-CM | POA: Diagnosis present

## 2019-12-10 DIAGNOSIS — R579 Shock, unspecified: Secondary | ICD-10-CM | POA: Diagnosis present

## 2019-12-10 DIAGNOSIS — I272 Pulmonary hypertension, unspecified: Secondary | ICD-10-CM | POA: Diagnosis present

## 2019-12-10 DIAGNOSIS — D62 Acute posthemorrhagic anemia: Secondary | ICD-10-CM | POA: Diagnosis present

## 2019-12-10 DIAGNOSIS — J811 Chronic pulmonary edema: Secondary | ICD-10-CM | POA: Diagnosis not present

## 2019-12-10 DIAGNOSIS — I5043 Acute on chronic combined systolic (congestive) and diastolic (congestive) heart failure: Secondary | ICD-10-CM | POA: Diagnosis present

## 2019-12-10 DIAGNOSIS — K2970 Gastritis, unspecified, without bleeding: Secondary | ICD-10-CM | POA: Diagnosis not present

## 2019-12-10 DIAGNOSIS — R578 Other shock: Secondary | ICD-10-CM | POA: Diagnosis not present

## 2019-12-10 DIAGNOSIS — I10 Essential (primary) hypertension: Secondary | ICD-10-CM | POA: Diagnosis not present

## 2019-12-10 DIAGNOSIS — K264 Chronic or unspecified duodenal ulcer with hemorrhage: Secondary | ICD-10-CM | POA: Diagnosis present

## 2019-12-10 DIAGNOSIS — I9589 Other hypotension: Secondary | ICD-10-CM | POA: Diagnosis not present

## 2019-12-10 DIAGNOSIS — D509 Iron deficiency anemia, unspecified: Secondary | ICD-10-CM | POA: Diagnosis present

## 2019-12-10 DIAGNOSIS — K269 Duodenal ulcer, unspecified as acute or chronic, without hemorrhage or perforation: Secondary | ICD-10-CM | POA: Diagnosis not present

## 2019-12-10 DIAGNOSIS — I5042 Chronic combined systolic (congestive) and diastolic (congestive) heart failure: Secondary | ICD-10-CM

## 2019-12-10 DIAGNOSIS — J90 Pleural effusion, not elsewhere classified: Secondary | ICD-10-CM | POA: Diagnosis not present

## 2019-12-10 DIAGNOSIS — N179 Acute kidney failure, unspecified: Secondary | ICD-10-CM | POA: Diagnosis not present

## 2019-12-10 DIAGNOSIS — E1169 Type 2 diabetes mellitus with other specified complication: Secondary | ICD-10-CM | POA: Diagnosis present

## 2019-12-10 DIAGNOSIS — N1831 Chronic kidney disease, stage 3a: Secondary | ICD-10-CM | POA: Diagnosis present

## 2019-12-10 DIAGNOSIS — Z6841 Body Mass Index (BMI) 40.0 and over, adult: Secondary | ICD-10-CM | POA: Diagnosis not present

## 2019-12-10 DIAGNOSIS — I4821 Permanent atrial fibrillation: Secondary | ICD-10-CM | POA: Diagnosis present

## 2019-12-10 DIAGNOSIS — I248 Other forms of acute ischemic heart disease: Secondary | ICD-10-CM | POA: Diagnosis present

## 2019-12-10 DIAGNOSIS — D649 Anemia, unspecified: Secondary | ICD-10-CM | POA: Diagnosis not present

## 2019-12-10 DIAGNOSIS — Z7901 Long term (current) use of anticoagulants: Secondary | ICD-10-CM

## 2019-12-10 DIAGNOSIS — I480 Paroxysmal atrial fibrillation: Secondary | ICD-10-CM

## 2019-12-10 DIAGNOSIS — I517 Cardiomegaly: Secondary | ICD-10-CM | POA: Diagnosis not present

## 2019-12-10 DIAGNOSIS — E1122 Type 2 diabetes mellitus with diabetic chronic kidney disease: Secondary | ICD-10-CM | POA: Diagnosis present

## 2019-12-10 DIAGNOSIS — E871 Hypo-osmolality and hyponatremia: Secondary | ICD-10-CM | POA: Diagnosis present

## 2019-12-10 DIAGNOSIS — I2510 Atherosclerotic heart disease of native coronary artery without angina pectoris: Secondary | ICD-10-CM | POA: Diagnosis present

## 2019-12-10 DIAGNOSIS — E785 Hyperlipidemia, unspecified: Secondary | ICD-10-CM | POA: Diagnosis present

## 2019-12-10 DIAGNOSIS — I130 Hypertensive heart and chronic kidney disease with heart failure and stage 1 through stage 4 chronic kidney disease, or unspecified chronic kidney disease: Secondary | ICD-10-CM | POA: Diagnosis present

## 2019-12-10 DIAGNOSIS — R0602 Shortness of breath: Secondary | ICD-10-CM

## 2019-12-10 DIAGNOSIS — I4891 Unspecified atrial fibrillation: Secondary | ICD-10-CM | POA: Diagnosis present

## 2019-12-10 DIAGNOSIS — R531 Weakness: Secondary | ICD-10-CM | POA: Diagnosis not present

## 2019-12-10 DIAGNOSIS — Z20822 Contact with and (suspected) exposure to covid-19: Secondary | ICD-10-CM | POA: Diagnosis present

## 2019-12-10 DIAGNOSIS — I428 Other cardiomyopathies: Secondary | ICD-10-CM | POA: Diagnosis not present

## 2019-12-10 LAB — CBC WITH DIFFERENTIAL/PLATELET
Abs Immature Granulocytes: 0.41 10*3/uL — ABNORMAL HIGH (ref 0.00–0.07)
Abs Immature Granulocytes: 0.24 10*3/uL — ABNORMAL HIGH (ref 0.00–0.07)
Basophils Absolute: 0 10*3/uL (ref 0.0–0.1)
Basophils Absolute: 0.1 10*3/uL (ref 0.0–0.1)
Basophils Relative: 0 %
Basophils Relative: 1 %
Eosinophils Absolute: 0.1 10*3/uL (ref 0.0–0.5)
Eosinophils Absolute: 0.1 10*3/uL (ref 0.0–0.5)
Eosinophils Relative: 1 %
Eosinophils Relative: 1 %
HCT: 14.8 % — ABNORMAL LOW (ref 39.0–52.0)
HCT: 18.7 % — ABNORMAL LOW (ref 39.0–52.0)
Hemoglobin: 4.2 g/dL — CL (ref 13.0–17.0)
Hemoglobin: 5.7 g/dL — CL (ref 13.0–17.0)
Immature Granulocytes: 2 %
Immature Granulocytes: 2 %
Lymphocytes Relative: 9 %
Lymphocytes Relative: 9 %
Lymphs Abs: 1.7 10*3/uL (ref 0.7–4.0)
Lymphs Abs: 1.4 10*3/uL (ref 0.7–4.0)
MCH: 26.4 pg (ref 26.0–34.0)
MCH: 28.2 pg (ref 26.0–34.0)
MCHC: 28.4 g/dL — ABNORMAL LOW (ref 30.0–36.0)
MCHC: 30.5 g/dL (ref 30.0–36.0)
MCV: 93.1 fL (ref 80.0–100.0)
MCV: 92.6 fL (ref 80.0–100.0)
Monocytes Absolute: 1.2 10*3/uL — ABNORMAL HIGH (ref 0.1–1.0)
Monocytes Absolute: 1.2 10*3/uL — ABNORMAL HIGH (ref 0.1–1.0)
Monocytes Relative: 6 %
Monocytes Relative: 8 %
Neutro Abs: 15.5 10*3/uL — ABNORMAL HIGH (ref 1.7–7.7)
Neutro Abs: 13.4 10*3/uL — ABNORMAL HIGH (ref 1.7–7.7)
Neutrophils Relative %: 82 %
Neutrophils Relative %: 79 %
Platelets: 379 10*3/uL (ref 150–400)
Platelets: 353 10*3/uL (ref 150–400)
RBC: 1.59 MIL/uL — ABNORMAL LOW (ref 4.22–5.81)
RBC: 2.02 MIL/uL — ABNORMAL LOW (ref 4.22–5.81)
RDW: 26.6 % — ABNORMAL HIGH (ref 11.5–15.5)
RDW: 22.6 % — ABNORMAL HIGH (ref 11.5–15.5)
WBC: 18.9 10*3/uL — ABNORMAL HIGH (ref 4.0–10.5)
WBC: 16.4 10*3/uL — ABNORMAL HIGH (ref 4.0–10.5)
nRBC: 0.9 % — ABNORMAL HIGH (ref 0.0–0.2)
nRBC: 0.9 % — ABNORMAL HIGH (ref 0.0–0.2)

## 2019-12-10 LAB — BRAIN NATRIURETIC PEPTIDE: B Natriuretic Peptide: 154.7 pg/mL — ABNORMAL HIGH (ref 0.0–100.0)

## 2019-12-10 LAB — ABO/RH: ABO/RH(D): O POS

## 2019-12-10 LAB — BASIC METABOLIC PANEL
Anion gap: 14 (ref 5–15)
Anion gap: 14 (ref 5–15)
BUN: 94 mg/dL — ABNORMAL HIGH (ref 8–23)
BUN: 94 mg/dL — ABNORMAL HIGH (ref 8–23)
CO2: 30 mmol/L (ref 22–32)
CO2: 28 mmol/L (ref 22–32)
Calcium: 7.8 mg/dL — ABNORMAL LOW (ref 8.9–10.3)
Calcium: 7.9 mg/dL — ABNORMAL LOW (ref 8.9–10.3)
Chloride: 89 mmol/L — ABNORMAL LOW (ref 98–111)
Chloride: 91 mmol/L — ABNORMAL LOW (ref 98–111)
Creatinine, Ser: 2.16 mg/dL — ABNORMAL HIGH (ref 0.61–1.24)
Creatinine, Ser: 2.34 mg/dL — ABNORMAL HIGH (ref 0.61–1.24)
GFR calc Af Amer: 35 mL/min — ABNORMAL LOW (ref >60)
GFR calc Af Amer: 32 mL/min — ABNORMAL LOW (ref >60)
GFR calc non Af Amer: 30 mL/min — ABNORMAL LOW (ref >60)
GFR calc non Af Amer: 28 mL/min — ABNORMAL LOW (ref >60)
Glucose, Bld: 187 mg/dL — ABNORMAL HIGH (ref 70–99)
Glucose, Bld: 182 mg/dL — ABNORMAL HIGH (ref 70–99)
Potassium: 4.2 mmol/L (ref 3.5–5.1)
Potassium: 4.2 mmol/L (ref 3.5–5.1)
Sodium: 133 mmol/L — ABNORMAL LOW (ref 135–145)
Sodium: 133 mmol/L — ABNORMAL LOW (ref 135–145)

## 2019-12-10 LAB — CBG MONITORING, ED
Glucose-Capillary: 169 mg/dL — ABNORMAL HIGH (ref 70–99)
Glucose-Capillary: 157 mg/dL — ABNORMAL HIGH (ref 70–99)
Glucose-Capillary: 128 mg/dL — ABNORMAL HIGH (ref 70–99)
Glucose-Capillary: 120 mg/dL — ABNORMAL HIGH (ref 70–99)
Glucose-Capillary: 129 mg/dL — ABNORMAL HIGH (ref 70–99)
Glucose-Capillary: 139 mg/dL — ABNORMAL HIGH (ref 70–99)

## 2019-12-10 LAB — LACTIC ACID, PLASMA
Lactic Acid, Venous: 3.5 mmol/L (ref 0.5–1.9)
Lactic Acid, Venous: 2 mmol/L (ref 0.5–1.9)

## 2019-12-10 LAB — HEPATIC FUNCTION PANEL
ALT: 23 U/L (ref 0–44)
AST: 23 U/L (ref 15–41)
Albumin: 2.7 g/dL — ABNORMAL LOW (ref 3.5–5.0)
Alkaline Phosphatase: 45 U/L (ref 38–126)
Bilirubin, Direct: <0.1 mg/dL (ref 0.0–0.2)
Total Bilirubin: 1 mg/dL (ref 0.3–1.2)
Total Protein: 6 g/dL — ABNORMAL LOW (ref 6.5–8.1)

## 2019-12-10 LAB — PROTIME-INR
INR: 4.3 (ref 0.8–1.2)
INR: 1.7 — ABNORMAL HIGH (ref 0.8–1.2)
INR: 1.4 — ABNORMAL HIGH (ref 0.8–1.2)
Prothrombin Time: 40.2 seconds — ABNORMAL HIGH (ref 11.4–15.2)
Prothrombin Time: 19.3 seconds — ABNORMAL HIGH (ref 11.4–15.2)
Prothrombin Time: 16.3 seconds — ABNORMAL HIGH (ref 11.4–15.2)

## 2019-12-10 LAB — CBC
HCT: 21.9 % — ABNORMAL LOW (ref 39.0–52.0)
Hemoglobin: 6.7 g/dL — CL (ref 13.0–17.0)
MCH: 28.6 pg (ref 26.0–34.0)
MCHC: 30.6 g/dL (ref 30.0–36.0)
MCV: 93.6 fL (ref 80.0–100.0)
Platelets: 349 10*3/uL (ref 150–400)
RBC: 2.34 MIL/uL — ABNORMAL LOW (ref 4.22–5.81)
RDW: 21.9 % — ABNORMAL HIGH (ref 11.5–15.5)
WBC: 15.7 10*3/uL — ABNORMAL HIGH (ref 4.0–10.5)
nRBC: 0.8 % — ABNORMAL HIGH (ref 0.0–0.2)

## 2019-12-10 LAB — PREPARE RBC (CROSSMATCH)

## 2019-12-10 LAB — RESPIRATORY PANEL BY RT PCR (FLU A&B, COVID)
Influenza A by PCR: NEGATIVE
Influenza B by PCR: NEGATIVE
SARS Coronavirus 2 by RT PCR: NEGATIVE

## 2019-12-10 LAB — HEMOGLOBIN A1C
Hgb A1c MFr Bld: 8.4 % — ABNORMAL HIGH (ref 4.8–5.6)
Mean Plasma Glucose: 194.38 mg/dL

## 2019-12-10 LAB — TROPONIN I (HIGH SENSITIVITY)
Troponin I (High Sensitivity): 90 ng/L — ABNORMAL HIGH (ref <18)
Troponin I (High Sensitivity): 89 ng/L — ABNORMAL HIGH (ref <18)

## 2019-12-10 LAB — DIGOXIN LEVEL: Digoxin Level: 0.8 ng/mL — ABNORMAL LOW (ref 1.0–2.0)

## 2019-12-10 LAB — POC OCCULT BLOOD, ED
Fecal Occult Bld: NEGATIVE
Fecal Occult Bld: POSITIVE — AB

## 2019-12-10 LAB — LIPASE, BLOOD: Lipase: 45 U/L (ref 11–51)

## 2019-12-10 LAB — HIV ANTIBODY (ROUTINE TESTING W REFLEX): HIV Screen 4th Generation wRfx: NONREACTIVE

## 2019-12-10 LAB — AMMONIA: Ammonia: 16 umol/L (ref 9–35)

## 2019-12-10 MED ORDER — SODIUM CHLORIDE 0.9 % IV SOLN
8.0000 mg/h | INTRAVENOUS | Status: DC
  Administered 2019-12-10 – 2019-12-11 (×3): 8 mg/h via INTRAVENOUS
  Filled 2019-12-10 (×4): qty 80

## 2019-12-10 MED ORDER — ONDANSETRON HCL 4 MG PO TABS: 4.0000 mg | ORAL_TABLET | Freq: Four times a day (QID) | ORAL | Status: DC | PRN

## 2019-12-10 MED ORDER — FUROSEMIDE 10 MG/ML IJ SOLN
40.0000 mg | Freq: Two times a day (BID) | INTRAMUSCULAR | Status: DC
  Administered 2019-12-10 (×2): 40 mg via INTRAVENOUS
  Filled 2019-12-10 (×2): qty 4

## 2019-12-10 MED ORDER — ACETAMINOPHEN 650 MG RE SUPP: 650.0000 mg | Freq: Four times a day (QID) | RECTAL | Status: DC | PRN

## 2019-12-10 MED ORDER — SODIUM CHLORIDE 0.9 % IV SOLN
10.0000 mL/h | Freq: Once | INTRAVENOUS | Status: AC
  Administered 2019-12-10: 10 mL/h via INTRAVENOUS

## 2019-12-10 MED ORDER — INSULIN ASPART 100 UNIT/ML ~~LOC~~ SOLN
0.0000 [IU] | SUBCUTANEOUS | Status: DC
  Administered 2019-12-10 (×3): 2 [IU] via SUBCUTANEOUS
  Administered 2019-12-10 – 2019-12-11 (×2): 3 [IU] via SUBCUTANEOUS

## 2019-12-10 MED ORDER — SODIUM CHLORIDE 0.9% IV SOLUTION: Freq: Once | INTRAVENOUS | Status: DC

## 2019-12-10 MED ORDER — ACETAMINOPHEN 325 MG PO TABS: 650.0000 mg | ORAL_TABLET | Freq: Four times a day (QID) | ORAL | Status: DC | PRN

## 2019-12-10 MED ORDER — SODIUM CHLORIDE 0.9 % IV BOLUS: 500.0000 mL | Freq: Once | INTRAVENOUS | Status: DC

## 2019-12-10 MED ORDER — SODIUM CHLORIDE 0.9 % IV SOLN: Freq: Once | INTRAVENOUS | Status: DC

## 2019-12-10 MED ORDER — VITAMIN K1 10 MG/ML IJ SOLN
10.0000 mg | Freq: Once | INTRAVENOUS | Status: AC
  Administered 2019-12-10: 10 mg via INTRAVENOUS
  Filled 2019-12-10: qty 1

## 2019-12-10 MED ORDER — PANTOPRAZOLE SODIUM 40 MG IV SOLR: 40.0000 mg | Freq: Two times a day (BID) | INTRAVENOUS | Status: DC

## 2019-12-10 MED ORDER — ONDANSETRON HCL 4 MG/2ML IJ SOLN: 4.0000 mg | Freq: Four times a day (QID) | INTRAMUSCULAR | Status: DC | PRN

## 2019-12-10 MED ORDER — SODIUM CHLORIDE 0.9 % IV SOLN
80.0000 mg | Freq: Once | INTRAVENOUS | Status: AC
  Administered 2019-12-10: 07:00:00 80 mg via INTRAVENOUS
  Filled 2019-12-10: qty 80

## 2019-12-10 MED ORDER — PROTHROMBIN COMPLEX CONC HUMAN 500 UNITS IV KIT
2100.0000 [IU] | PACK | Status: AC
  Administered 2019-12-10: 2100 [IU] via INTRAVENOUS
  Filled 2019-12-10: qty 2100

## 2019-12-10 NOTE — ED Notes (Signed)
Gi at bedside
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
## 2019-12-10 NOTE — ED Notes (Signed)
Update given to Dr. Havery Moros, requesting pt receive a 4th unit of blood. Admitting doctor made aware of need for order.

## 2019-12-10 NOTE — Progress Notes (Signed)
   12/10/19 1900  Clinical Encounter Type  Visited With Patient  Visit Type ED  Referral From Nurse  Consult/Referral To Chaplain  Spiritual Encounters  Spiritual Needs Prayer;Emotional  Stress Factors  Patient Stress Factors Exhausted;Health changes;Lack of knowledge  Family Stress Factors None identified  Chaplain was called from ED bridge at the patient's request to see a chaplain. Patient requested scripture and prayer. Chaplain read Psalm 103 and prayed with patient.
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
## 2019-12-10 NOTE — Progress Notes (Signed)
Pt seen in ER as he is awaiting progressive care bed. Reviewed chart.  Pt is resting comfortably at this time. States he is hungry. No further bleeding. No bowel movement since being admitted he states.  Is hemodynamically stable.  No concerns by RN staff at this time Continue current care while awaiting for transfer to progressive care unit.

## 2019-12-10 NOTE — ED Notes (Signed)
CBG 139. 

## 2019-12-10 NOTE — ED Triage Notes (Signed)
Pt is saying his heart is beating fast and he feels weak. Pt said no chest pain just hard to catch his breath. Pt said legs are weak and he feels like he cant walk or keep his balance very well. Pt's pressures a re very low in triage.

## 2019-12-10 NOTE — ED Notes (Signed)
Attempted condom catheter x3 without success

## 2019-12-10 NOTE — Anesthesia Preprocedure Evaluation (Addendum)
Anesthesia Evaluation  Patient identified by MRN, date of birth, ID band Patient awake    Reviewed: Allergy & Precautions, NPO status , Patient's Chart, lab work & pertinent test results, reviewed documented beta blocker date and time   History of Anesthesia Complications Negative for: history of anesthetic complications  Airway Mallampati: IV  TM Distance: >3 FB Neck ROM: Full    Dental  (+) Poor Dentition, Chipped, Dental Advisory Given   Pulmonary sleep apnea and Continuous Positive Airway Pressure Ventilation ,    Pulmonary exam normal breath sounds clear to auscultation       Cardiovascular hypertension, Pt. on medications + CAD and +CHF  + dysrhythmias Atrial Fibrillation  Rhythm:Irregular Rate:Normal + Systolic murmurs  '20 RHC - Relatively well compensated pressures with mild pulmonary HTN in setting of elevated cardiac output  '20 TTE - EF 25 to 30%. There appears to be diffuse hypokinesis. Mildly D-shaped interventricular septum suggestive of RV pressure/volume overload. Left ventricular diastolic Doppler parameters are consistent with impaired relaxation pattern of LV diastolic filling. Trace MR and TR. Mild AS. Mean gradient 13 mmHg. Global right ventricle has moderately reduced systolic function. The right ventricular size is moderately enlarged. The estimated right ventricular systolic pressure is moderately elevated at 50.3 mmHg. Left atrial size was mildly dilated.  Non ischemic CM  Permanent Atrial fibrillation   Neuro/Psych negative neurological ROS  negative psych ROS   GI/Hepatic Hx/o acute liver failure 2015 Upper Gi bleed Melena   Endo/Other  diabetes, Poorly Controlled, Type 2, Oral Hypoglycemic Agents, Insulin DependentMorbid obesityGout Hyperlipidemia Na 133 Ca 7.9 Cl 91   Renal/GU Renal InsufficiencyRenal disease  negative genitourinary   Musculoskeletal  (+) Arthritis , Osteoarthritis,   Hx/o Cellulitis both lower extremities   Abdominal (+) + obese,   Peds  Hematology  (+) anemia , Coumadin therapy- last dose INR 1.7    Anesthesia Other Findings   Reproductive/Obstetrics                          Anesthesia Physical Anesthesia Plan  ASA: IV  Anesthesia Plan: MAC   Post-op Pain Management:    Induction: Intravenous  PONV Risk Score and Plan: 1 and Propofol infusion and Treatment may vary due to age or medical condition  Airway Management Planned: Nasal Cannula and Natural Airway  Additional Equipment: None  Intra-op Plan:   Post-operative Plan:   Informed Consent: I have reviewed the patients History and Physical, chart, labs and discussed the procedure including the risks, benefits and alternatives for the proposed anesthesia with the patient or authorized representative who has indicated his/her understanding and acceptance.     Dental advisory given  Plan Discussed with: CRNA and Anesthesiologist  Anesthesia Plan Comments:       Anesthesia Quick Evaluation

## 2019-12-10 NOTE — ED Notes (Signed)
Pt transferred to hospital bed for comfort.

## 2019-12-10 NOTE — ED Notes (Signed)
Patient receiving blood
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
## 2019-12-10 NOTE — Progress Notes (Signed)
69 year old gentleman with a history of combined systolic and diastolic congestive heart failure, diabetes type 2, A. fib on Coumadin, CKD stage IIIa presented to ED with fatigue and palpitation and was eventually found to have possible upper GI bleed with acute blood loss anemia and hemoglobin of 4.2 and INR of 4.3.  Received K Centra as well as vitamin K.  His INR today is 1.7.  Received 2 units of PRBC transfusion.  GI was consulted.  He is going to receive third unit of transfusion per my discussion with GI today.  Seen and examined, patient although is on oxygen and chest x-ray shows vascular congestion/acute pulmonary edema, denies any shortness of breath.  Only complains of weakness.  Currently on 2 to 3 L oxygen.  Does not require any oxygen at home.  Has history of sleep apnea and supposed to wear CPAP but does not comply with that either.  On exam, he had crackles bilaterally.  Otherwise rest of the clinical exam was benign.  GI and I have discussed the plan to do them up all the debris more, transfuse and provide him some more diuretics and get him ready for EGD tomorrow morning.  I will start him on Lasix 40 mg IV twice daily.  He has mild AKI however that might improve with IV Lasix.  BNP slightly elevated, previously normal in December 2020.  His A. fib is controlled.  Coumadin is on hold for obvious reasons.  Otherwise is stable.
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
## 2019-12-10 NOTE — ED Notes (Signed)
Pt desat to 80% while sleeping, pt usually wears CPAP at night while sleeping. Pt placed on 2L [place]

## 2019-12-10 NOTE — ED Notes (Signed)
Chaplain at bedside per pt request

## 2019-12-11 ENCOUNTER — Encounter (HOSPITAL_COMMUNITY)
Admission: EM | Disposition: A | Discharge status: Home / Self Care | Payer: Self-pay | Source: Home / Self Care | Attending: Family Medicine

## 2019-12-11 ENCOUNTER — Inpatient Hospital Stay (HOSPITAL_COMMUNITY): Payer: Medicare HMO | Admitting: Anesthesiology

## 2019-12-11 ENCOUNTER — Encounter (HOSPITAL_COMMUNITY): Payer: Self-pay | Admitting: Internal Medicine

## 2019-12-11 DIAGNOSIS — D509 Iron deficiency anemia, unspecified: Secondary | ICD-10-CM

## 2019-12-11 HISTORY — PX: BIOPSY: SHX5522

## 2019-12-11 HISTORY — PX: ESOPHAGOGASTRODUODENOSCOPY (EGD) WITH PROPOFOL: SHX5813

## 2019-12-11 LAB — CBC WITH DIFFERENTIAL/PLATELET
Abs Immature Granulocytes: 0.17 10*3/uL — ABNORMAL HIGH (ref 0.00–0.07)
Basophils Absolute: 0 10*3/uL (ref 0.0–0.1)
Basophils Relative: 0 %
Eosinophils Absolute: 0.1 10*3/uL (ref 0.0–0.5)
Eosinophils Relative: 1 %
HCT: 24.8 % — ABNORMAL LOW (ref 39.0–52.0)
Hemoglobin: 7.4 g/dL — ABNORMAL LOW (ref 13.0–17.0)
Immature Granulocytes: 1 %
Lymphocytes Relative: 8 %
Lymphs Abs: 1 10*3/uL (ref 0.7–4.0)
MCH: 28.1 pg (ref 26.0–34.0)
MCHC: 29.8 g/dL — ABNORMAL LOW (ref 30.0–36.0)
MCV: 94.3 fL (ref 80.0–100.0)
Monocytes Absolute: 0.9 10*3/uL (ref 0.1–1.0)
Monocytes Relative: 8 %
Neutro Abs: 10 10*3/uL — ABNORMAL HIGH (ref 1.7–7.7)
Neutrophils Relative %: 82 %
Platelets: 348 10*3/uL (ref 150–400)
RBC: 2.63 MIL/uL — ABNORMAL LOW (ref 4.22–5.81)
RDW: 21 % — ABNORMAL HIGH (ref 11.5–15.5)
WBC: 12.2 10*3/uL — ABNORMAL HIGH (ref 4.0–10.5)
nRBC: 0.6 % — ABNORMAL HIGH (ref 0.0–0.2)

## 2019-12-11 LAB — PROTIME-INR
INR: 1.2 (ref 0.8–1.2)
Prothrombin Time: 15 seconds (ref 11.4–15.2)

## 2019-12-11 LAB — I-STAT CHEM 8, ED
BUN: 75 mg/dL — ABNORMAL HIGH (ref 8–23)
Calcium, Ion: 1.07 mmol/L — ABNORMAL LOW (ref 1.15–1.40)
Chloride: 95 mmol/L — ABNORMAL LOW (ref 98–111)
Creatinine, Ser: 1.9 mg/dL — ABNORMAL HIGH (ref 0.61–1.24)
Glucose, Bld: 164 mg/dL — ABNORMAL HIGH (ref 70–99)
HCT: 23 % — ABNORMAL LOW (ref 39.0–52.0)
Hemoglobin: 7.8 g/dL — ABNORMAL LOW (ref 13.0–17.0)
Potassium: 4 mmol/L (ref 3.5–5.1)
Sodium: 137 mmol/L (ref 135–145)
TCO2: 31 mmol/L (ref 22–32)

## 2019-12-11 LAB — GLUCOSE, CAPILLARY
Glucose-Capillary: 157 mg/dL — ABNORMAL HIGH (ref 70–99)
Glucose-Capillary: 161 mg/dL — ABNORMAL HIGH (ref 70–99)
Glucose-Capillary: 262 mg/dL — ABNORMAL HIGH (ref 70–99)
Glucose-Capillary: 236 mg/dL — ABNORMAL HIGH (ref 70–99)

## 2019-12-11 SURGERY — ESOPHAGOGASTRODUODENOSCOPY (EGD) WITH PROPOFOL
Anesthesia: Monitor Anesthesia Care

## 2019-12-11 MED ORDER — LACTATED RINGERS IV SOLN: INTRAVENOUS | Status: DC | PRN

## 2019-12-11 MED ORDER — INSULIN ASPART 100 UNIT/ML ~~LOC~~ SOLN
0.0000 [IU] | Freq: Every day | SUBCUTANEOUS | Status: DC
  Administered 2019-12-11: 2 [IU] via SUBCUTANEOUS

## 2019-12-11 MED ORDER — ONDANSETRON HCL 4 MG/2ML IJ SOLN
INTRAMUSCULAR | Status: DC | PRN
  Administered 2019-12-11: 4 mg via INTRAVENOUS

## 2019-12-11 MED ORDER — ATORVASTATIN CALCIUM 40 MG PO TABS
40.0000 mg | ORAL_TABLET | Freq: Every day | ORAL | Status: DC
  Administered 2019-12-11 – 2019-12-13 (×3): 40 mg via ORAL
  Filled 2019-12-11 (×3): qty 1

## 2019-12-11 MED ORDER — INSULIN ASPART 100 UNIT/ML ~~LOC~~ SOLN
0.0000 [IU] | Freq: Three times a day (TID) | SUBCUTANEOUS | Status: DC
  Administered 2019-12-11: 8 [IU] via SUBCUTANEOUS
  Administered 2019-12-11: 3 [IU] via SUBCUTANEOUS
  Administered 2019-12-12 (×2): 2 [IU] via SUBCUTANEOUS
  Administered 2019-12-12: 11 [IU] via SUBCUTANEOUS
  Administered 2019-12-13: 2 [IU] via SUBCUTANEOUS

## 2019-12-11 MED ORDER — PROPOFOL 10 MG/ML IV BOLUS
INTRAVENOUS | Status: DC | PRN
  Administered 2019-12-11: 30 mg via INTRAVENOUS

## 2019-12-11 MED ORDER — PROPOFOL 500 MG/50ML IV EMUL
INTRAVENOUS | Status: DC | PRN
  Administered 2019-12-11: 75 ug/kg/min via INTRAVENOUS

## 2019-12-11 MED ORDER — LIDOCAINE VISCOUS HCL 2 % MT SOLN
OROMUCOSAL | Status: AC
  Filled 2019-12-11: qty 15

## 2019-12-11 MED ORDER — INSULIN ASPART PROT & ASPART (70-30 MIX) 100 UNIT/ML ~~LOC~~ SUSP
40.0000 [IU] | Freq: Two times a day (BID) | SUBCUTANEOUS | Status: DC
  Administered 2019-12-11 – 2019-12-12 (×2): 40 [IU] via SUBCUTANEOUS
  Filled 2019-12-11: qty 10

## 2019-12-11 MED ORDER — FUROSEMIDE 10 MG/ML IJ SOLN
20.0000 mg | Freq: Two times a day (BID) | INTRAMUSCULAR | Status: DC
  Administered 2019-12-11 – 2019-12-13 (×4): 20 mg via INTRAVENOUS
  Filled 2019-12-11 (×4): qty 2

## 2019-12-11 MED ORDER — PANTOPRAZOLE SODIUM 40 MG IV SOLR
40.0000 mg | Freq: Two times a day (BID) | INTRAVENOUS | Status: DC
  Administered 2019-12-11 (×2): 40 mg via INTRAVENOUS
  Filled 2019-12-11 (×2): qty 40

## 2019-12-11 MED ORDER — AMIODARONE HCL 200 MG PO TABS
200.0000 mg | ORAL_TABLET | Freq: Every day | ORAL | Status: DC
  Administered 2019-12-11 – 2019-12-13 (×2): 200 mg via ORAL
  Filled 2019-12-11 (×2): qty 1

## 2019-12-11 MED ORDER — DIGOXIN 125 MCG PO TABS
0.1250 mg | ORAL_TABLET | Freq: Every day | ORAL | Status: DC
  Administered 2019-12-11 – 2019-12-13 (×2): 0.125 mg via ORAL
  Filled 2019-12-11 (×2): qty 1

## 2019-12-11 SURGICAL SUPPLY — 15 items

## 2019-12-11 NOTE — ED Notes (Signed)
Pt to ENDO

## 2019-12-11 NOTE — ED Notes (Signed)
157ML CBG

## 2019-12-11 NOTE — Anesthesia Postprocedure Evaluation (Signed)
Anesthesia Post Note  Patient: Maclain Cohron  Procedure(s) Performed: ESOPHAGOGASTRODUODENOSCOPY (EGD) WITH PROPOFOL (N/A ) BIOPSY     Patient location during evaluation: PACU Anesthesia Type: MAC Level of consciousness: awake and alert and oriented Pain management: pain level controlled Vital Signs Assessment: post-procedure vital signs reviewed and stable Respiratory status: spontaneous breathing, nonlabored ventilation, respiratory function stable and patient connected to nasal cannula oxygen Cardiovascular status: stable and blood pressure returned to baseline Postop Assessment: no apparent nausea or vomiting Anesthetic complications: no   No complications documented.  Last Vitals:  Vitals:   12/11/19 0849 12/11/19 0859  BP: (!) 149/61 114/70  Pulse: 82 80  Resp: 16 (!) 23  Temp:  (!) 36.4 C  SpO2: 92% 94%    Last Pain:  Vitals:   12/11/19 0859  TempSrc: Oral  PainSc: 0-No pain                 Qusai Kem A.

## 2019-12-11 NOTE — Progress Notes (Signed)
PROGRESS NOTE    Raymond Pratt  HFW:[id number] DOB: 01/16/1951 DOA: 12/10/2019 PCP: Shon Baton, MD   Brief Narrative:  Raymond Pratt is a 69 y.o. male with medical history significant of CHF, DM2, A.Fib on coumadin, CKD 3a, OSA not using CPAP presented to the ED with c/o progressively worsening fatigue for the past few days.  Generalized weakness to point of being unable to get out of bed. Not COVID vaccinated.  No sick contacts. Initial BP 02D systolic, improved with trendelenburg positioning. HGB 4.2, INR 4.3.  Pt with gross melena on exam that is hemoccult positive (incorrectly entered as 'negative' in epic).BUN 94 (c/w UGIB), creat 2.1 (up from baseline 1.4).  He received K Centra as well as vitamin K to reverse his Coumadin.  He was also found to have vascular congestion/acute pulmonary monitor chest x-ray.  He was then started on IV Lasix.  He received total of 4 units of PRBC until this morning.  Underwent EGD this morning which showed cratered but not bleeding duodenal ulcer, gastritis.  Biopsy taken.  His PPI has been switched from GTT to IV twice daily.  Assessment & Plan:   Principal Problem:   Acute blood loss anemia Active Problems:   Type 2 diabetes mellitus with hyperlipidemia (HCC)   Anticoagulated on Coumadin   Chronic combined systolic and diastolic heart failure (HCC)   HTN (hypertension)   A-fib (HCC)   AKI (acute kidney injury) (HCC)   Gastrointestinal hemorrhage with melena   Generalized weakness/upper GI bleeding/duodenal ulcer/gastritis/acute blood loss anemia: Presented with hemoglobin of 4.2.  Has received total of 4 units of PRBC until this morning so far.  Hemoglobin 7.4 this morning.  Feels much better.  Now status post EGD by GI which shows cratered duodenal but nonbleeding ulcer, gastritis.  Biopsies were taken.  GI has switched him to IV PPI twice daily and recommend continuing this until tomorrow and switching to p.o. PPI twice daily for 2 months and  then once daily.  Reportedly, he was taking Alka-Seltzer frequently.  This probably is the cause of his ulcer.  Will monitor H&H every 12 hours.  Permanent atrial fibrillation: Came in with supratherapeutic INR.  Rates are controlled.  INR subtherapeutic.  Per GI, can resume his Coumadin after 2 days.  Will resume his digoxin.  Essential hypertension: Controlled.  Continue to hold Aldactone and other nephrotoxic antihypertensives.  AKI on CKD stage IIIa: Per chart review, creatinine of 1.5 seems to be his baseline.  Came in with 2.16 and now slightly higher.  This is likely ATN/prerenal due to severe anemia.  Continue to avoid nephrotoxic agents.  Now that his CHF has improved, I will reduce his Lasix dose to 20 mg twice daily from 40 mg twice daily.  Acute on chronic combined systolic and diastolic congestive heart failure: Chest x-ray with pulmonary edema/vascular congestion.  On exam he had significant crackles yesterday and was on oxygen.  Confused feels much better.  Has bibasilar fine crackles and he is not on any oxygen delivery.  We will reduce his Lasix to 20 mg IV and run it for another day.  Reassess tomorrow for possible switching to oral home dose of Lasix.  Strict I's and O's with daily weight.  Net -1.1 04 since admission. hold Entresto for now due to elevated crt.  Type 2 diabetes mellitus: Seems to be taking 70/30 at home 60 units twice daily however he was supposed to take 40 units.  I will resume 40 units  and SSI.  Hemoglobin A1c just over 8.  Hyperlipidemia: Resume atorvastatin.  OSA: Noncompliant with CPAP.  Morbid obesity: Weight loss counseling provided.  DVT prophylaxis: SCDs Start: 12/10/19 0321   Code Status: Full Code  Family Communication:  None present at bedside.  Plan of care discussed with patient in length and he verbalized understanding and agreed with it.  Status is: Inpatient  Remains inpatient appropriate because:Inpatient level of care appropriate due to  severity of illness   Dispo: The patient is from: Home              Anticipated d/c is to: Home              Anticipated d/c date is: 1 day              Patient currently is not medically stable to d/c.        Estimated body mass index is 44.89 kg/m as calculated from the following:   Height as of this encounter: 5\' 7"  (1.702 m).   Weight as of this encounter: 130 kg.      Nutritional status:               Consultants:   GI  Procedures:   EGD  Antimicrobials:  Anti-infectives (From admission, onward)   None         Subjective: Seen and examined.  Feels much better today.  Denies any shortness of breath or any other complaint.  Objective: Vitals:   12/11/19 0741 12/11/19 0839 12/11/19 0849 12/11/19 0859  BP: (!) 106/38 122/72 (!) 149/61 114/70  Pulse: 74 82 82 80  Resp: (!) 23 (!) 24 16 (!) 23  Temp: 97.7 F (36.5 C) 97.9 F (36.6 C)  (!) 97.5 F (36.4 C)  TempSrc: Temporal Temporal  Oral  SpO2: 97% 96% 92% 94%  Weight: 130 kg     Height: 5\' 7"  (1.702 m)       Intake/Output Summary (Last 24 hours) at 12/11/2019 1053 Last data filed at 12/11/2019 0500 Gross per 24 hour  Intake 945 ml  Output 1300 ml  Net -355 ml   Filed Weights   12/10/19 0006 12/11/19 0741  Weight: 130.2 kg 130 kg    Examination:  General exam: Appears calm and comfortable, morbidly obese Respiratory system: Faint bibasilar crackles. Respiratory effort normal. Cardiovascular system: S1 & S2 heard, irregularly irregular. No JVD, murmurs, rubs, gallops or clicks.  Trace pitting edema bilateral lower extremity. Gastrointestinal system: Abdomen is nondistended, soft and nontender. No organomegaly or masses felt. Normal bowel sounds heard. Central nervous system: Alert and oriented. No focal neurological deficits. Extremities: Symmetric 5 x 5 power. Skin: No rashes, lesions or ulcers Psychiatry: Judgement and insight appear normal. Mood & affect appropriate.    Data  Reviewed: I have personally reviewed following labs and imaging studies  CBC: Recent Labs  Lab 12/10/19 0153 12/10/19 1148 12/10/19 1830 12/11/19 0649 12/11/19 1029  WBC 18.9* 16.4* 15.7*  --  12.2*  NEUTROABS 15.5* 13.4*  --   --  10.0*  HGB 4.2* 5.7* 6.7* 7.8* 7.4*  HCT 14.8* 18.7* 21.9* 23.0* 24.8*  MCV 93.1 92.6 93.6  --  94.3  PLT 379 353 349  --  941   Basic Metabolic Panel: Recent Labs  Lab 12/10/19 0046 12/10/19 0344 12/11/19 0649  NA 133* 133* 137  K 4.2 4.2 4.0  CL 89* 91* 95*  CO2 30 28  --   GLUCOSE 187* 182* 164*  BUN 94* 94* 75*  CREATININE 2.16* 2.34* 1.90*  CALCIUM 7.8* 7.9*  --    GFR: Estimated Creatinine Clearance: 48.3 mL/min (A) (by C-G formula based on SCr of 1.9 mg/dL (H)). Liver Function Tests: Recent Labs  Lab 12/10/19 0154  AST 23  ALT 23  ALKPHOS 45  BILITOT 1.0  PROT 6.0*  ALBUMIN 2.7*   Recent Labs  Lab 12/10/19 0154  LIPASE 45   Recent Labs  Lab 12/10/19 0152  AMMONIA 16   Coagulation Profile: Recent Labs  Lab 12/10/19 0154 12/10/19 0510 12/10/19 1148 12/11/19 0500  INR 4.3* 1.7* 1.4* 1.2   Cardiac Enzymes: No results for input(s): CKTOTAL, CKMB, CKMBINDEX, TROPONINI in the last 168 hours. BNP (last 3 results) No results for input(s): PROBNP in the last 8760 hours. HbA1C: Recent Labs    12/10/19 1148  HGBA1C 8.4*   CBG: Recent Labs  Lab 12/10/19 0422 12/10/19 0733 12/10/19 1238 12/10/19 1612 12/10/19 2042  GLUCAP 157* 128* 120* 129* 139*   Lipid Profile: No results for input(s): CHOL, HDL, LDLCALC, TRIG, CHOLHDL, LDLDIRECT in the last 72 hours. Thyroid Function Tests: No results for input(s): TSH, T4TOTAL, FREET4, T3FREE, THYROIDAB in the last 72 hours. Anemia Panel: No results for input(s): VITAMINB12, FOLATE, FERRITIN, TIBC, IRON, RETICCTPCT in the last 72 hours. Sepsis Labs: Recent Labs  Lab 12/10/19 0152 12/10/19 0517  LATICACIDVEN 3.5* 2.0*    Recent Results (from the past 240 hour(s))   Respiratory Panel by RT PCR (Flu A&B, Covid) - Nasopharyngeal Swab     Status: None   Collection Time: 12/10/19  1:56 AM   Specimen: Nasopharyngeal Swab  Result Value Ref Range Status   SARS Coronavirus 2 by RT PCR NEGATIVE NEGATIVE Final    Comment: (NOTE) SARS-CoV-2 target nucleic acids are NOT DETECTED.  The SARS-CoV-2 RNA is generally detectable in upper respiratoy specimens during the acute phase of infection. The lowest concentration of SARS-CoV-2 viral copies this assay can detect is 131 copies/mL. A negative result does not preclude SARS-Cov-2 infection and should not be used as the sole basis for treatment or other patient management decisions. A negative result may occur with  improper specimen collection/handling, submission of specimen other than nasopharyngeal swab, presence of viral mutation(s) within the areas targeted by this assay, and inadequate number of viral copies (<131 copies/mL). A negative result must be combined with clinical observations, patient history, and epidemiological information. The expected result is Negative.  Fact Sheet for Patients:  PinkCheek.be  Fact Sheet for Healthcare Providers:  GravelBags.it  This test is no t yet approved or cleared by the [place] FDA and  has been authorized for detection and/or diagnosis of SARS-CoV-2 by FDA under an Emergency Use Authorization (EUA). This EUA will remain  in effect (meaning this test can be used) for the duration of the COVID-19 declaration under Section 564(b)(1) of the Act, 21 U.S.C. section 360bbb-3(b)(1), unless the authorization is terminated or revoked sooner.     Influenza A by PCR NEGATIVE NEGATIVE Final   Influenza B by PCR NEGATIVE NEGATIVE Final    Comment: (NOTE) The Xpert Xpress SARS-CoV-2/FLU/RSV assay is intended as an aid in  the diagnosis of influenza from Nasopharyngeal swab specimens and  should not be used as  a sole basis for treatment. Nasal washings and  aspirates are unacceptable for Xpert Xpress SARS-CoV-2/FLU/RSV  testing.  Fact Sheet for Patients: PinkCheek.be  Fact Sheet for Healthcare Providers: GravelBags.it  This test is not yet approved or cleared  by the [place] and  has been authorized for detection and/or diagnosis of SARS-CoV-2 by  FDA under an Emergency Use Authorization (EUA). This EUA will remain  in effect (meaning this test can be used) for the duration of the  Covid-19 declaration under Section 564(b)(1) of the Act, 21  U.S.C. section 360bbb-3(b)(1), unless the authorization is  terminated or revoked. Performed at [facility], [place] [address], [place], [place] [address]       Radiology Studies: DG Chest Port 1 View  Result Date: 12/10/2019 CLINICAL DATA:  Weakness. EXAM: PORTABLE CHEST 1 VIEW COMPARISON:  12/05/2018 FINDINGS: There is cardiomegaly. There is a retrocardiac opacity at the left lung base. The pulmonary vasculature appears dilated. There is no pneumothorax or large pleural effusion. There is no acute osseous abnormality. IMPRESSION: 1. Retrocardiac opacity, atelectasis versus infiltrate. 2. Cardiomegaly with pulmonary vascular congestion. 3. Dilated pulmonary arteries which can be seen in patients with elevated pulmonary artery pressures. Electronically Signed   By: Constance Holster M.D.   On: 12/10/2019 01:08    Scheduled Meds: . sodium chloride   Intravenous Once  . furosemide  40 mg Intravenous Q12H  . insulin aspart  0-15 Units Subcutaneous Q4H  . pantoprazole (PROTONIX) IV  40 mg Intravenous Q12H   Continuous Infusions:   LOS: 1 day   Time spent: 35 minutes   Darliss Cheney, MD [facility]  12/11/2019, 10:53 AM   To contact the attending provider between 7A-7P or the covering provider during after hours 7P-7A, please log into the web site [email or web address].

## 2019-12-11 NOTE — Op Note (Addendum)
[facility] Patient Name: Raymond Pratt Procedure Date : 12/11/2019 MRN: [id number] Attending MD: [name] , MD Date of Birth: [birth year] CSN: [id number] Age: 69 Admit Type: Inpatient Procedure:                Upper GI endoscopy Indications:              Anemia, Melena in setting of supratherapeutic INR                            (4s) Providers:                [name], MD, Burtis Junes, RN, Erenest Rasher,                            RN, Ladona Ridgel, Technician, Viann Fish, CRNA Referring MD:              Medicines:                Monitored Anesthesia Care Complications:            No immediate complications. Estimated blood loss:                            None. Estimated Blood Loss:     None Procedure:                Pre-Anesthesia Assessment:                           - Prior to the procedure, a History and Physical                            was performed, and patient medications and                            allergies were reviewed. The patient's tolerance of                            previous anesthesia was also reviewed. The risks                            and benefits of the procedure and the sedation                            options and risks were discussed with the patient.                            All questions were answered, and informed consent                            was obtained. Prior Anticoagulants: The patient has                            taken Coumadin (warfarin), last dose was 3 days  prior to procedure. ASA Grade Assessment: IV - A                            patient with severe systemic disease that is a                            constant threat to life. After reviewing the risks                            and benefits, the patient was deemed in                            satisfactory condition to undergo the procedure.                           After obtaining informed consent, the  endoscope was                            passed under direct vision. Throughout the                            procedure, the patient's blood pressure, pulse, and                            oxygen saturations were monitored continuously. The                            [id number]-[id number] ([id number]) Olympus gastroscope was                            introduced through the mouth, and advanced to the                            second part of duodenum. The upper GI endoscopy was                            accomplished without difficulty. The patient                            tolerated the procedure well. Scope In: Scope Out: Findings:      One non-bleeding cratered duodenal ulcer with no stigmata of bleeding       was found just distal to the duodenal bulb. The ulcer was clean based       and measured about 32mm across. The adjacent mucosa was edematous and not       neoplastic appearing.      Mild inflammation characterized by erythema was found in the gastric       antrum. Biopsies were taken with a cold forceps for histology.      The exam was otherwise without abnormality. Impression:               - Clean based, cratered duodenal ulcer at low risk  for rebleeding.                           - Mild gastritis, biopsied to check for H. pylori                           - In recovery he explained that he has been taking                            generic Alka Seltzer several times per week for                            many years.                           - The examination was otherwise normal. Recommendation:           - Await pathology results. Will start appropriate                            antibiotics if this is + for H. pylori. I think it                            is very possible that the Alka Seltzer type meds                            he's been taking for years has caused the ulcer.                           - He was not on PPI prior to this admission.                             Currently on IV PPI drip which I am changing to IV                            BID. It will probably be safe to change to PPI                            orally BID tomorrow and he should continue BID PPI                            for at least the next 2 months and the once daily                            thereafter.                           - OK to resume blood thinners in 2 days. Procedure Code(s):        --- Professional ---                           [phone], Esophagogastroduodenoscopy, flexible,  transoral; with biopsy, single or multiple Diagnosis Code(s):        --- Professional ---                           K26.9, Duodenal ulcer, unspecified as acute or                            chronic, without hemorrhage or perforation                           K29.70, Gastritis, unspecified, without bleeding                           D50.9, Iron deficiency anemia, unspecified                           K92.1, Melena (includes Hematochezia) CPT copyright 2019 American Medical Association. All rights reserved. The codes documented in this report are preliminary and upon coder review may  be revised to meet current compliance requirements. [name], MD 12/11/2019 8:58:14 AM This report has been signed electronically. Number of Addenda: 0

## 2019-12-11 NOTE — Transfer of Care (Signed)
Immediate Anesthesia Transfer of Care Note  Patient: Raymond Pratt  Procedure(s) Performed: ESOPHAGOGASTRODUODENOSCOPY (EGD) WITH PROPOFOL (N/A ) BIOPSY  Patient Location: Endo  Anesthesia Type:MAC  Level of Consciousness: awake, alert  and oriented  Airway & Oxygen Therapy: Patient Spontanous Breathing and Patient connected to nasal cannula oxygen  Post-op Assessment: Report given to RN and Post -op Vital signs reviewed and stable  Post vital signs: Reviewed and stable  Last Vitals:  Vitals Value Taken Time  BP    Temp    Pulse 80 12/11/19 0841  Resp 18 12/11/19 0841  SpO2 96 % 12/11/19 0841  Vitals shown include unvalidated device data.  Last Pain:  Vitals:   12/11/19 0741  TempSrc: Temporal  PainSc: 0-No pain         Complications: No complications documented.

## 2019-12-11 NOTE — Interval H&P Note (Signed)
History and Physical Interval Note:  12/11/2019 7:49 AM  Raymond Pratt  has presented today for surgery, with the diagnosis of upper gi bleed / anemia.  The various methods of treatment have been discussed with the patient and family. After consideration of risks, benefits and other options for treatment, the patient has consented to  Procedure(s): ESOPHAGOGASTRODUODENOSCOPY (EGD) WITH PROPOFOL (N/A) as a surgical intervention.  The patient's history has been reviewed, patient examined, no change in status, stable for surgery.  I have reviewed the patient's chart and labs.  Questions were answered to the patient's satisfaction.     [name]

## 2019-12-12 ENCOUNTER — Encounter (HOSPITAL_COMMUNITY): Payer: Self-pay | Admitting: Internal Medicine

## 2019-12-12 DIAGNOSIS — K269 Duodenal ulcer, unspecified as acute or chronic, without hemorrhage or perforation: Secondary | ICD-10-CM | POA: Diagnosis not present

## 2019-12-12 DIAGNOSIS — D62 Acute posthemorrhagic anemia: Secondary | ICD-10-CM | POA: Diagnosis not present

## 2019-12-12 LAB — CBC
HCT: 23 % — ABNORMAL LOW (ref 39.0–52.0)
HCT: 27.5 % — ABNORMAL LOW (ref 39.0–52.0)
Hemoglobin: 6.9 g/dL — CL (ref 13.0–17.0)
Hemoglobin: 8.4 g/dL — ABNORMAL LOW (ref 13.0–17.0)
MCH: 28 pg (ref 26.0–34.0)
MCH: 28 pg (ref 26.0–34.0)
MCHC: 30 g/dL (ref 30.0–36.0)
MCHC: 30.5 g/dL (ref 30.0–36.0)
MCV: 93.5 fL (ref 80.0–100.0)
MCV: 91.7 fL (ref 80.0–100.0)
Platelets: 326 10*3/uL (ref 150–400)
Platelets: 309 10*3/uL (ref 150–400)
RBC: 2.46 MIL/uL — ABNORMAL LOW (ref 4.22–5.81)
RBC: 3 MIL/uL — ABNORMAL LOW (ref 4.22–5.81)
RDW: 20 % — ABNORMAL HIGH (ref 11.5–15.5)
RDW: 18.7 % — ABNORMAL HIGH (ref 11.5–15.5)
WBC: 10.2 10*3/uL (ref 4.0–10.5)
WBC: 8.6 10*3/uL (ref 4.0–10.5)
nRBC: 0.6 % — ABNORMAL HIGH (ref 0.0–0.2)
nRBC: 0.7 % — ABNORMAL HIGH (ref 0.0–0.2)

## 2019-12-12 LAB — BASIC METABOLIC PANEL
Anion gap: 10 (ref 5–15)
BUN: 66 mg/dL — ABNORMAL HIGH (ref 8–23)
CO2: 28 mmol/L (ref 22–32)
Calcium: 7.8 mg/dL — ABNORMAL LOW (ref 8.9–10.3)
Chloride: 95 mmol/L — ABNORMAL LOW (ref 98–111)
Creatinine, Ser: 2.01 mg/dL — ABNORMAL HIGH (ref 0.61–1.24)
GFR calc non Af Amer: 33 mL/min — ABNORMAL LOW (ref >60)
Glucose, Bld: 142 mg/dL — ABNORMAL HIGH (ref 70–99)
Potassium: 4.1 mmol/L (ref 3.5–5.1)
Sodium: 133 mmol/L — ABNORMAL LOW (ref 135–145)

## 2019-12-12 LAB — PROTIME-INR
INR: 1.1 (ref 0.8–1.2)
Prothrombin Time: 14.2 seconds (ref 11.4–15.2)

## 2019-12-12 LAB — GLUCOSE, CAPILLARY
Glucose-Capillary: 123 mg/dL — ABNORMAL HIGH (ref 70–99)
Glucose-Capillary: 145 mg/dL — ABNORMAL HIGH (ref 70–99)
Glucose-Capillary: 146 mg/dL — ABNORMAL HIGH (ref 70–99)
Glucose-Capillary: 147 mg/dL — ABNORMAL HIGH (ref 70–99)
Glucose-Capillary: 175 mg/dL — ABNORMAL HIGH (ref 70–99)
Glucose-Capillary: 345 mg/dL — ABNORMAL HIGH (ref 70–99)

## 2019-12-12 LAB — SURGICAL PATHOLOGY

## 2019-12-12 LAB — PREPARE RBC (CROSSMATCH)

## 2019-12-12 MED ORDER — SODIUM CHLORIDE 0.9% IV SOLUTION: Freq: Once | INTRAVENOUS | Status: AC

## 2019-12-12 MED ORDER — PANTOPRAZOLE SODIUM 40 MG PO TBEC
40.0000 mg | DELAYED_RELEASE_TABLET | Freq: Two times a day (BID) | ORAL | Status: DC
  Administered 2019-12-12 – 2019-12-13 (×3): 40 mg via ORAL
  Filled 2019-12-12 (×3): qty 1

## 2019-12-12 MED ORDER — INSULIN ASPART PROT & ASPART (70-30 MIX) 100 UNIT/ML ~~LOC~~ SUSP
50.0000 [IU] | Freq: Two times a day (BID) | SUBCUTANEOUS | Status: DC
  Administered 2019-12-12 – 2019-12-13 (×2): 50 [IU] via SUBCUTANEOUS
  Filled 2019-12-12: qty 10

## 2019-12-12 MED ORDER — FUROSEMIDE 10 MG/ML IJ SOLN: 20.0000 mg | Freq: Once | INTRAMUSCULAR | Status: DC

## 2019-12-12 MED ORDER — ACETAMINOPHEN 325 MG PO TABS
650.0000 mg | ORAL_TABLET | Freq: Once | ORAL | Status: AC
  Administered 2019-12-12: 650 mg via ORAL
  Filled 2019-12-12: qty 2

## 2019-12-12 MED ORDER — DIPHENHYDRAMINE HCL 25 MG PO CAPS
25.0000 mg | ORAL_CAPSULE | Freq: Once | ORAL | Status: AC
  Administered 2019-12-12: 25 mg via ORAL
  Filled 2019-12-12: qty 1

## 2019-12-12 NOTE — Progress Notes (Signed)
HOSPITAL MEDICINE OVERNIGHT EVENT NOTE    Notified by nursing that blood pressure is currently 96/77 and patient is due 20 mg of IV Lasix.  Chart reviewed, patient has known systolic congestive heart failure with EF of 25 to 30%.  Patient is currently being treated for acute congestive heart failure with evidence of pulmonary edema and peripheral edema with ongoing oxygen requirement.  Considering recent blood transfusion for GI bleeding, I have advised that we get a stat CBC.  If hemoglobin and hematocrit are stable then it is likely safe to give the patient this dose of Lasix.  Vernelle Emerald  MD [facility]

## 2019-12-12 NOTE — Progress Notes (Signed)
Raymond Pratt  YFV:[id number] DOB: 1950/04/23 DOA: 12/10/2019 PCP: Shon Baton, MD    Brief Narrative:  69 year old with a history of CHF, DM 2, A. fib on chronic Coumadin, CKD stage IIIa, and OSA not on CPAP who presented to the ED with worsening fatigue for a few days and severe generalized weakness to the point he cannot get out of bed.  Initial blood pressure was in the 16B systolic.  Hemoglobin was found to be profoundly low at 4.2 with an INR 4.3 and gross melena on exam.  BUN was 94 with a creatinine of 2.1.  The patient's anticoagulation was reversed.  He was given PRBC and also diuresed due to pulmonary edema complicating his presentation.  He ultimately underwent an EGD which showed a cratered but nonbleeding duodenal ulcer and gastritis.  He was subsequently transition from a PPI drip to IV twice daily dosing.  Significant Events:  10/4 admit via ED 10/5 EGD  Vaccination Status: Not vaccinated against Covid  Antimicrobials:  None  DVT prophylaxis: SCDs in setting of acute bleeding  Subjective: The patient's hemoglobin drifted down again overnight requiring the patient be transfused 1 additional unit PRBC today.  He has no new complaints but states he is resting very poorly in the hospital.  He feels very tired and is motivated to go home.  He denies chest pain nausea vomiting or present abdominal pain.  He reports a fair appetite.  Assessment & Plan:  Hemorrhagic shock -acute severe blood loss anemia Required a total of 5 units PRBC - presenting hemoglobin 4.2 -hemoglobin has improved appropriately with 1 additional unit today -recheck in a.m. -suspect this is simply due to reequilibration and not active ongoing blood loss  Recent Labs  Lab 12/10/19 1830 12/11/19 0649 12/11/19 1029 12/12/19 0247 12/12/19 1359  HGB 6.7* 7.8* 7.4* 6.9* 8.4*     Acute upper GI bleed -duodenal ulcer -gastritis EGD 10/5 noted a cratered duodenal ulcer with biopsies taken -treated  with PPI drip initially then transition to IV PPI and to complete twice daily PPI for 2 months after discharge  Permanent atrial fibrillation Rate controlled  Warfarin induced coagulopathy INR supratherapeutic at presentation -safe to resume warfarin 10/7 per GI  Essential hypertension Blood pressure presently well controlled  Acute kidney injury on CKD stage IIIa Baseline creatinine approximately 1.5 -creatinine 2.16 at presentation in setting of hemorrhagic shock -follow-up creatinine in a.m. after transfusion  Recent Labs  Lab 12/10/19 0046 12/10/19 0344 12/11/19 0649 12/12/19 0247  CREATININE 2.16* 2.34* 1.90* 2.01*     Acute on chronic combined systolic and diastolic CHF Pulmonary edema noted on initial CXR -continue to diurese with furosemide -wean to room air  DM 2 Require 70/30 insulin at home - A1c 8.4 -CBG quite variable -monitor without change in treatment at this time  HLD Continue atorvastatin  OSA Prescribed CPAP but noncompliant with this at home  Morbid obesity - Body mass index is 44.89 kg/m.   Code Status: FULL CODE Family Communication:  Status is: Inpatient  Remains inpatient appropriate because:Inpatient level of care appropriate due to severity of illness   Dispo: The patient is from: Home              Anticipated d/c is to: Home              Anticipated d/c date is: 1 day              Patient currently is not medically stable to d/c.  Consultants:  none  Objective: Blood pressure (!) 110/47, pulse 76, temperature (!) 97.5 F (36.4 C), temperature source Oral, resp. rate 18, height 5\' 7"  (1.702 m), weight 130 kg, SpO2 92 %.  Intake/Output Summary (Last 24 hours) at 12/12/2019 1534 Last data filed at 12/12/2019 0839 Gross per 24 hour  Intake 350 ml  Output 500 ml  Net -150 ml   Filed Weights   12/10/19 0006 12/11/19 0741  Weight: 130.2 kg 130 kg    Examination: General: No acute respiratory distress Lungs: Clear to  auscultation bilaterally without wheezes or crackles Cardiovascular: Regular rate and rhythm without murmur gallop or rub normal S1 and S2 Abdomen: Obese, soft, no rebound, no appreciable mass, BS positive Extremities: 1+ bilateral lower extremity edema without cyanosis or clubbing  CBC: Recent Labs  Lab 12/10/19 0153 12/10/19 0153 12/10/19 1148 12/10/19 1830 12/11/19 1029 12/12/19 0247 12/12/19 1359  WBC 18.9*   < > 16.4*   < > 12.2* 10.2 8.6  NEUTROABS 15.5*  --  13.4*  --  10.0*  --   --   HGB 4.2*   < > 5.7*   < > 7.4* 6.9* 8.4*  HCT 14.8*   < > 18.7*   < > 24.8* 23.0* 27.5*  MCV 93.1   < > 92.6   < > 94.3 93.5 91.7  PLT 379   < > 353   < > 348 326 309   < > = values in this interval not displayed.   Basic Metabolic Panel: Recent Labs  Lab 12/10/19 0046 12/10/19 0046 12/10/19 0344 12/11/19 0649 12/12/19 0247  NA 133*   < > 133* 137 133*  K 4.2   < > 4.2 4.0 4.1  CL 89*   < > 91* 95* 95*  CO2 30  --  28  --  28  GLUCOSE 187*   < > 182* 164* 142*  BUN 94*   < > 94* 75* 66*  CREATININE 2.16*   < > 2.34* 1.90* 2.01*  CALCIUM 7.8*  --  7.9*  --  7.8*   < > = values in this interval not displayed.   GFR: Estimated Creatinine Clearance: 45.6 mL/min (A) (by C-G formula based on SCr of 2.01 mg/dL (H)).  Liver Function Tests: Recent Labs  Lab 12/10/19 0154  AST 23  ALT 23  ALKPHOS 45  BILITOT 1.0  PROT 6.0*  ALBUMIN 2.7*   Recent Labs  Lab 12/10/19 0154  LIPASE 45   Recent Labs  Lab 12/10/19 0152  AMMONIA 16    Coagulation Profile: Recent Labs  Lab 12/10/19 0154 12/10/19 0510 12/10/19 1148 12/11/19 0500 12/12/19 0247  INR 4.3* 1.7* 1.4* 1.2 1.1    HbA1C: Hgb A1c MFr Bld  Date/Time Value Ref Range Status  12/10/2019 11:48 AM 8.4 (H) 4.8 - 5.6 % Final    Comment:    (NOTE) Pre diabetes:          5.7%-6.4%  Diabetes:              >6.4%  Glycemic control for   <7.0% adults with diabetes   12/06/2018 02:15 AM 13.3 (H) 4.8 - 5.6 % Final     Comment:    (NOTE) Pre diabetes:          5.7%-6.4% Diabetes:              >6.4% Glycemic control for   <7.0% adults with diabetes     CBG: Recent Labs  Lab 12/11/19 2104 12/12/19 0001 12/12/19 0421 12/12/19 0807 12/12/19 1127  GLUCAP 236* 175* 145* 147* 345*    Recent Results (from the past 240 hour(s))  Respiratory Panel by RT PCR (Flu A&B, Covid) - Nasopharyngeal Swab     Status: None   Collection Time: 12/10/19  1:56 AM   Specimen: Nasopharyngeal Swab  Result Value Ref Range Status   SARS Coronavirus 2 by RT PCR NEGATIVE NEGATIVE Final    Comment: (NOTE) SARS-CoV-2 target nucleic acids are NOT DETECTED.  The SARS-CoV-2 RNA is generally detectable in upper respiratoy specimens during the acute phase of infection. The lowest concentration of SARS-CoV-2 viral copies this assay can detect is 131 copies/mL. A negative result does not preclude SARS-Cov-2 infection and should not be used as the sole basis for treatment or other patient management decisions. A negative result may occur with  improper specimen collection/handling, submission of specimen other than nasopharyngeal swab, presence of viral mutation(s) within the areas targeted by this assay, and inadequate number of viral copies (<131 copies/mL). A negative result must be combined with clinical observations, patient history, and epidemiological information. The expected result is Negative.  Fact Sheet for Patients:  PinkCheek.be  Fact Sheet for Healthcare Providers:  GravelBags.it  This test is no t yet approved or cleared by the [place] FDA and  has been authorized for detection and/or diagnosis of SARS-CoV-2 by FDA under an Emergency Use Authorization (EUA). This EUA will remain  in effect (meaning this test can be used) for the duration of the COVID-19 declaration under Section 564(b)(1) of the Act, 21 U.S.C. section 360bbb-3(b)(1), unless  the authorization is terminated or revoked sooner.     Influenza A by PCR NEGATIVE NEGATIVE Final   Influenza B by PCR NEGATIVE NEGATIVE Final    Comment: (NOTE) The Xpert Xpress SARS-CoV-2/FLU/RSV assay is intended as an aid in  the diagnosis of influenza from Nasopharyngeal swab specimens and  should not be used as a sole basis for treatment. Nasal washings and  aspirates are unacceptable for Xpert Xpress SARS-CoV-2/FLU/RSV  testing.  Fact Sheet for Patients: PinkCheek.be  Fact Sheet for Healthcare Providers: GravelBags.it  This test is not yet approved or cleared by the [place] FDA and  has been authorized for detection and/or diagnosis of SARS-CoV-2 by  FDA under an Emergency Use Authorization (EUA). This EUA will remain  in effect (meaning this test can be used) for the duration of the  Covid-19 declaration under Section 564(b)(1) of the Act, 21  U.S.C. section 360bbb-3(b)(1), unless the authorization is  terminated or revoked. Performed at [facility], [place] [address], [place], [place] [address]      Scheduled Meds: . amiodarone  200 mg Oral Daily  . atorvastatin  40 mg Oral Daily  . digoxin  0.125 mg Oral Daily  . furosemide  20 mg Intravenous Q12H  . furosemide  20 mg Intravenous Once  . insulin aspart  0-15 Units Subcutaneous TID WC  . insulin aspart  0-5 Units Subcutaneous QHS  . insulin aspart protamine- aspart  40 Units Subcutaneous BID WC  . pantoprazole  40 mg Oral BID     LOS: 2 days   Cherene Altes, MD [facility] Office  [phone] Pager - Text Page per Shea Evans  If 7PM-7AM, please contact night-coverage per Amion 12/12/2019, 3:34 PM

## 2019-12-12 NOTE — Progress Notes (Signed)
CSW met with pt regarding readmission risk.  Pt reports Dr Virgina Jock fired him as PCP and he has made contact with [facility] in [place].  Pt reports he received voicemail from them recently but is not sure if he has appt currently.  Pt reports he has his own vehicle and also can get help with transportation from granddaughter if needed. CSW spoke with [facility] and scheduled hospital DC appt for 12/21/19. Lurline Idol, MSW, LCSW 10/6/20219:49 AM

## 2019-12-12 NOTE — Plan of Care (Signed)
  Problem: Bowel/Gastric: Goal: Will show no signs and symptoms of gastrointestinal bleeding Outcome: Progressing   Problem: Fluid Volume: Goal: Will show no signs and symptoms of excessive bleeding Outcome: Progressing   Problem: Clinical Measurements: Goal: Complications related to the disease process, condition or treatment will be avoided or minimized Outcome: Progressing   

## 2019-12-12 NOTE — Progress Notes (Signed)
CRITICAL VALUE ALERT  Critical Value: HGB 6.9 Date & Time Notied:  0350  Provider Notified: ON CALL HOSPITALIST  Orders Received/Actions taken: awaiting orders

## 2019-12-12 NOTE — Progress Notes (Signed)
Daily Rounding Note  12/12/2019, 10:00 AM  LOS: 2 days   SUBJECTIVE:   Chief complaint:  Upper GIB.  Blood loss anemia    Pt getting additional 2 PRBCs (5th and 6th this admit) this AM for Hgb 6.9. Last BM was loose, dark earlier yesterday.   No abd distress. Breathing at baseline.     OBJECTIVE:         Vital signs in last 24 hours:    Temp:  [97.8 F (36.6 C)-98.6 F (37 C)] 97.9 F (36.6 C) (10/06 0914) Pulse Rate:  [78-88] 80 (10/06 0914) Resp:  [14-24] 20 (10/06 0914) BP: (91-116)/(36-88) 105/45 (10/06 0914) SpO2:  [89 %-92 %] 92 % (10/06 0914) Last BM Date: 12/11/19 Filed Weights   12/10/19 0006 12/11/19 0741  Weight: 130.2 kg 130 kg   General: morbidly obese, looks chronically ill   Heart: RRR.  Tele with NSR at 83. Chest: some dyspnea w speech.   Abdomen: soft, obese, NT, active BS  Extremities: lower legs w violaceous discoloring and swelling c/w venous insufficiency.   Neuro/Psych:  Garrulous.  Oriented x 3.  Poor historian.  Moves all 4 limbs.    Intake/Output from previous day: 10/05 0701 - 10/06 0700 In: -  Out: 500 [Urine:500]  Intake/Output this shift: Total I/O In: 350 [Blood:350] Out: -   Lab Results: Recent Labs    12/10/19 1830 12/10/19 1830 12/11/19 0649 12/11/19 1029 12/12/19 0247  WBC 15.7*  --   --  12.2* 10.2  HGB 6.7*   < > 7.8* 7.4* 6.9*  HCT 21.9*   < > 23.0* 24.8* 23.0*  PLT 349  --   --  348 326   < > = values in this interval not displayed.   BMET Recent Labs    12/10/19 0046 12/10/19 0046 12/10/19 0344 12/11/19 0649 12/12/19 0247  NA 133*   < > 133* 137 133*  K 4.2   < > 4.2 4.0 4.1  CL 89*   < > 91* 95* 95*  CO2 30  --  28  --  28  GLUCOSE 187*   < > 182* 164* 142*  BUN 94*   < > 94* 75* 66*  CREATININE 2.16*   < > 2.34* 1.90* 2.01*  CALCIUM 7.8*  --  7.9*  --  7.8*   < > = values in this interval not displayed.   LFT Recent Labs    12/10/19 0154   PROT 6.0*  ALBUMIN 2.7*  AST 23  ALT 23  ALKPHOS 45  BILITOT 1.0  BILIDIR <0.1  IBILI NOT CALCULATED   PT/INR Recent Labs    12/11/19 0500 12/12/19 0247  LABPROT 15.0 14.2  INR 1.2 1.1   Hepatitis Panel No results for input(s): HEPBSAG, HCVAB, HEPAIGM, HEPBIGM in the last 72 hours.  Studies/Results: No results found.   Scheduled Meds: . amiodarone  200 mg Oral Daily  . atorvastatin  40 mg Oral Daily  . digoxin  0.125 mg Oral Daily  . furosemide  20 mg Intravenous Q12H  . furosemide  20 mg Intravenous Once  . insulin aspart  0-15 Units Subcutaneous TID WC  . insulin aspart  0-5 Units Subcutaneous QHS  . insulin aspart protamine- aspart  40 Units Subcutaneous BID WC  . pantoprazole  40 mg Oral BID   Continuous Infusions: PRN Meds:.acetaminophen **OR** [DISCONTINUED] acetaminophen, ondansetron **OR** ondansetron (ZOFRAN) IV   ASSESMENT:   *  GIB w melena in setting supratherapeutic INR, chronic alka seltzer.   10/5 EGD: clean based DU, low risk for re-bleeding.  Mild gastritis.  Biopsied for H pylori.  Protonix day 3 IV >> po bid.  No PPI/H2B PTA. 08/2013 Colonoscopy per Dr [facility] note of 12/2013 vs 2003 per [facility] record.    *   Blood loss anemia.  Hgb 4.2 >> 7.8 >> 6.9.   Got 6th unit of PRBCs starting 9 AM today.     *   Afib, on Coumadin.  INR max 4.3 >> 1.1 today.   S/p K centra, Vit K IV x 1.    *    AKI.  CKD 3a -3b at baseline in 10/2019.    *   Hyponatremia 133.    *   CHF.  EF 25 to 30%.   *   IDDM    PLAN   *  Repeat cbc due at 2 PM.  Recheck in AM as well.   Continue HH diet.   Treat w abx/PPI if H Pylori is positive.   Pt aware to cease Alka Seltzer.   Protonix 40 mg bid (or equivalent most cost saving PPI) for 8 weeks, then drop to 1 x daily long term.      Azucena Freed  12/12/2019, 10:00 AM Phone [phone]

## 2019-12-13 ENCOUNTER — Telehealth: Payer: Self-pay

## 2019-12-13 LAB — TYPE AND SCREEN
ABO/RH(D): O POS
Antibody Screen: NEGATIVE
Unit division: 0
Unit division: 0
Unit division: 0
Unit division: 0
Unit division: 0
Unit division: 0

## 2019-12-13 LAB — COMPREHENSIVE METABOLIC PANEL
ALT: 50 U/L — ABNORMAL HIGH (ref 0–44)
AST: 62 U/L — ABNORMAL HIGH (ref 15–41)
Albumin: 2.8 g/dL — ABNORMAL LOW (ref 3.5–5.0)
Alkaline Phosphatase: 91 U/L (ref 38–126)
Anion gap: 10 (ref 5–15)
BUN: 52 mg/dL — ABNORMAL HIGH (ref 8–23)
CO2: 27 mmol/L (ref 22–32)
Calcium: 8.2 mg/dL — ABNORMAL LOW (ref 8.9–10.3)
Chloride: 97 mmol/L — ABNORMAL LOW (ref 98–111)
Creatinine, Ser: 1.73 mg/dL — ABNORMAL HIGH (ref 0.61–1.24)
GFR calc non Af Amer: 40 mL/min — ABNORMAL LOW (ref >60)
Glucose, Bld: 141 mg/dL — ABNORMAL HIGH (ref 70–99)
Potassium: 4.3 mmol/L (ref 3.5–5.1)
Sodium: 134 mmol/L — ABNORMAL LOW (ref 135–145)
Total Bilirubin: 0.4 mg/dL (ref 0.3–1.2)
Total Protein: 6.4 g/dL — ABNORMAL LOW (ref 6.5–8.1)

## 2019-12-13 LAB — MAGNESIUM: Magnesium: 2.9 mg/dL — ABNORMAL HIGH (ref 1.7–2.4)

## 2019-12-13 LAB — BPAM RBC
Blood Product Expiration Date: 202110302359
Blood Product Expiration Date: 202110302359
Blood Product Expiration Date: 202110312359
Blood Product Expiration Date: 202110312359
Blood Product Expiration Date: 202111012359
Blood Product Expiration Date: 202111012359
ISSUE DATE / TIME: 202110040257
ISSUE DATE / TIME: 202110040654
ISSUE DATE / TIME: 202110041322
ISSUE DATE / TIME: 202110050117
ISSUE DATE / TIME: 202110060501
ISSUE DATE / TIME: 202110060838
Unit Type and Rh: 5100
Unit Type and Rh: 5100
Unit Type and Rh: 5100
Unit Type and Rh: 5100
Unit Type and Rh: 5100
Unit Type and Rh: 5100

## 2019-12-13 LAB — CBC
HCT: 28.8 % — ABNORMAL LOW (ref 39.0–52.0)
Hemoglobin: 8.8 g/dL — ABNORMAL LOW (ref 13.0–17.0)
MCH: 28.1 pg (ref 26.0–34.0)
MCHC: 30.6 g/dL (ref 30.0–36.0)
MCV: 92 fL (ref 80.0–100.0)
Platelets: 298 10*3/uL (ref 150–400)
RBC: 3.13 MIL/uL — ABNORMAL LOW (ref 4.22–5.81)
RDW: 18.6 % — ABNORMAL HIGH (ref 11.5–15.5)
WBC: 9.1 10*3/uL (ref 4.0–10.5)
nRBC: 0.8 % — ABNORMAL HIGH (ref 0.0–0.2)

## 2019-12-13 LAB — PROTIME-INR
INR: 1.1 (ref 0.8–1.2)
Prothrombin Time: 13.7 seconds (ref 11.4–15.2)

## 2019-12-13 LAB — GLUCOSE, CAPILLARY
Glucose-Capillary: 112 mg/dL — ABNORMAL HIGH (ref 70–99)
Glucose-Capillary: 229 mg/dL — ABNORMAL HIGH (ref 70–99)

## 2019-12-13 NOTE — Evaluation (Signed)
Physical Therapy Evaluation Patient Details Name: Raymond Pratt MRN: [id number] DOB: 1950-05-05 Today's Date: 12/13/2019   History of Present Illness  69 y.o. male with medical history significant of CHF, DM2, A.Fib on coumadin, CKD 3, OSA. Pt presents to the ED on 10/4 with progressively worsening fatigue for the past few days. Pt with Hgb 4.2, heme + stool, GI endoscopy reveals gastritis and duodenal ulcer. CXR demonstrates pulmonary edema and vascular congestion.  Clinical Impression   Pt presents with mild weakness vs baseline but WFL during mobility, fair standing balance with intermittent use of AD, dyspnea on exertion, and decreased activity tolerance vs baseline. Pt to benefit from acute PT to address deficits. Pt ambulated hallway distance with use of RW initially, transitioning to no AD, with supervision level of assist. Pt with mild unsteadiness, but pt-corrected and pt very aware of fall risk presently. Pt plans to d/c home with intermittent assist of family, no anticipated PT follow up needs. PT to progress mobility as tolerated, and will continue to follow acutely.   SpO2 96% on RA     Follow Up Recommendations No PT follow up;Supervision for mobility/OOB    Equipment Recommendations  None recommended by PT    Recommendations for Other Services       Precautions / Restrictions Precautions Precautions: Fall Restrictions Weight Bearing Restrictions: No      Mobility  Bed Mobility Overal bed mobility: Modified Independent             General bed mobility comments: up in chair  Transfers Overall transfer level: Needs assistance Equipment used: None Transfers: Sit to/from Stand Sit to Stand: Min guard         General transfer comment: for safety, mild unsteadiness upon initial standing but pt self-corrected.  Ambulation/Gait Ambulation/Gait assistance: Supervision Gait Distance (Feet): 60 Feet Assistive device: Rolling walker (2 wheeled);None Gait  Pattern/deviations: Step-through pattern;Decreased stride length;Trunk flexed Gait velocity: WFL   General Gait Details: supervision for safety, use of RW initially transitioning to no AD for last 20 ft ambulation. DOE 2/4, SpO2 96%. Pt attributing shortness of breath to mask use.  Stairs            Wheelchair Mobility    Modified Rankin (Stroke Patients Only)       Balance Overall balance assessment: Needs assistance Sitting-balance support: No upper extremity supported;Feet supported Sitting balance-Leahy Scale: Good     Standing balance support: No upper extremity supported Standing balance-Leahy Scale: Fair Standing balance comment: does not require use of AD, mild unsteadiness noted                             Pertinent Vitals/Pain Pain Assessment: No/denies pain    Home Living Family/patient expects to be discharged to:: Private residence Living Arrangements: Other (Comment) (granddaughter - works during the day) Available Help at Discharge: Family;Available PRN/intermittently Type of Home: House Home Access: Stairs to enter   Entrance Stairs-Number of Steps: 1 Home Layout: Two level;Able to live on main level with bedroom/bathroom Home Equipment: Kasandra Knudsen - single point;Walker - 2 wheels      Prior Function Level of Independence: Independent         Comments: Pt denies falls, states he does all his own errands     Hand Dominance   Dominant Hand: Right    Extremity/Trunk Assessment   Upper Extremity Assessment Upper Extremity Assessment: Defer to OT evaluation    Lower Extremity Assessment Lower  Extremity Assessment: Overall WFL for tasks assessed    Cervical / Trunk Assessment Cervical / Trunk Assessment: Normal  Communication   Communication: No difficulties  Cognition Arousal/Alertness: Awake/alert Behavior During Therapy: WFL for tasks assessed/performed Overall Cognitive Status: Within Functional Limits for tasks assessed                                         General Comments General comments (skin integrity, edema, etc.): SpO2 96% on RA    Exercises     Assessment/Plan    PT Assessment Patient needs continued PT services  PT Problem List Decreased strength;Decreased mobility;Decreased activity tolerance;Decreased balance;Decreased knowledge of use of DME;Decreased safety awareness       PT Treatment Interventions DME instruction;Therapeutic activities;Gait training;Therapeutic exercise;Patient/family education;Balance training;Stair training;Functional mobility training    PT Goals (Current goals can be found in the Care Plan section)  Acute Rehab PT Goals Patient Stated Goal: go home, feel better PT Goal Formulation: With patient Time For Goal Achievement: 12/27/19 Potential to Achieve Goals: Good    Frequency Min 3X/week   Barriers to discharge        Co-evaluation               AM-PAC PT "6 Clicks" Mobility  Outcome Measure Help needed turning from your back to your side while in a flat bed without using bedrails?: None Help needed moving from lying on your back to sitting on the side of a flat bed without using bedrails?: None Help needed moving to and from a bed to a chair (including a wheelchair)?: A Little Help needed standing up from a chair using your arms (e.g., wheelchair or bedside chair)?: A Little Help needed to walk in hospital room?: A Little Help needed climbing 3-5 steps with a railing? : A Little 6 Click Score: 20    End of Session   Activity Tolerance: Patient tolerated treatment well Patient left: in chair;with call bell/phone within reach Nurse Communication: Mobility status PT Visit Diagnosis: Other abnormalities of gait and mobility (R26.89);Difficulty in walking, not elsewhere classified (R26.2)    Time: 7416-3845 PT Time Calculation (min) (ACUTE ONLY): 14 min   Charges:   PT Evaluation $PT Eval Low Complexity: 1 Low          Melana Hingle E, PT Acute Rehabilitation Services Pager [phone]  Office [phone]   Jaecion Dempster D Edrie Ehrich 12/13/2019, 9:15 AM

## 2019-12-13 NOTE — Evaluation (Addendum)
Occupational Therapy Evaluation Patient Details Name: Raymond Pratt MRN: [id number] DOB: Feb 16, 1951 Today's Date: 12/13/2019    History of Present Illness 68 y.o. male with medical history significant of CHF, DM2, A.Fib on coumadin, CKD 3, OSA. Pt presents to the ED on 10/4 with progressively worsening fatigue for the past few days. Pt with Hgb 4.2, heme + stool, GI endoscopy reveals gastritis and duodenal ulcer. CXR demonstrates pulmonary edema and vascular congestion.   Clinical Impression   PTA patient independent with ADLs, limited IADLs and mobility. Admitted for above and limited by problem list below, including decreased activity tolerance, dyspnea upon exertion.  VSS during session. Patient requires min assist for LB ADLs, supervision for grooming at sink, min guard for simulated shower transfers.  Fatigues easily with minimal activity and relies on UE bracing support during ADLs due to decreased tolerance.  Pt reports limited activity tolerance at baseline, but would benefit from further OT services acutely to optimize independence, tolerance and review of energy conservation techniques for ADLs/IADL performance.  Will follow.     Follow Up Recommendations  No OT follow up;Other (comment) (pt declining HHOT services )    Equipment Recommendations  Tub/shower seat    Recommendations for Other Services       Precautions / Restrictions Precautions Precautions: Fall Restrictions Weight Bearing Restrictions: No      Mobility Bed Mobility Overal bed mobility: Modified Independent             General bed mobility comments: OOB in recliner upon entry   Transfers Overall transfer level: Needs assistance Equipment used: None Transfers: Sit to/from Stand Sit to Stand: Supervision         General transfer comment: for safety     Balance Overall balance assessment: Needs assistance Sitting-balance support: No upper extremity supported;Feet supported Sitting  balance-Leahy Scale: Good     Standing balance support: No upper extremity supported;Single extremity supported;During functional activity Standing balance-Leahy Scale: Fair Standing balance comment: dynamically supervision                            ADL either performed or assessed with clinical judgement   ADL Overall ADL's : Needs assistance/impaired     Grooming: Supervision/safety;Standing   Upper Body Bathing: Set up;Sitting   Lower Body Bathing: Sit to/from stand;Minimal assistance Lower Body Bathing Details (indicate cue type and reason): for posterior hygiene in standing  Upper Body Dressing : Set up;Sitting   Lower Body Dressing: Sit to/from stand;Minimal assistance Lower Body Dressing Details (indicate cue type and reason): pt reports using slip on shoes, required assist with socks today; supervision sit to stand   Toilet Transfer: Supervision/safety;Ambulation Toilet Transfer Details (indicate cue type and reason): simulated in room Toileting- Clothing Manipulation and Hygiene: Sit to/from stand;Minimal assistance Toileting - Clothing Manipulation Details (indicate cue type and reason): for posterior hygiene, decreased reach Tub/ Shower Transfer: Tub transfer;Min guard;Ambulation;Grab bars Tub/Shower Transfer Details (indicate cue type and reason): simulated in room  Functional mobility during ADLs: Supervision/safety General ADL Comments: pt limited by body habitus, decreased activity tolerance and endurance; anticipate baseline decreased hygiene due to body habitus with known difficulty reaching buttocks and feet; pt highly reliable on UE support standing during ADL engagmenet      Vision         Perception     Praxis      Pertinent Vitals/Pain Pain Assessment: No/denies pain     Hand Dominance Right  Extremity/Trunk Assessment Upper Extremity Assessment Upper Extremity Assessment: Overall WFL for tasks assessed   Lower Extremity  Assessment Lower Extremity Assessment: Defer to PT evaluation   Cervical / Trunk Assessment Cervical / Trunk Assessment: Normal   Communication Communication Communication: No difficulties   Cognition Arousal/Alertness: Awake/alert Behavior During Therapy: WFL for tasks assessed/performed Overall Cognitive Status: Within Functional Limits for tasks assessed                                 General Comments: appears WFL, requires minimal redirection to tasks and noted decreased baseline hygiene level    General Comments  pt preference to UE support due to decreased activity tolerance during ADL tasks, reviewed energy conservation techniques but would benefit from further education on [facility] and AE     Exercises     Shoulder Instructions      Home Living Family/patient expects to be discharged to:: Private residence Living Arrangements: Other (Comment) (granddaughter) Available Help at Discharge: Family;Available PRN/intermittently Type of Home: House Home Access: Stairs to enter Entrance Stairs-Number of Steps: 1   Home Layout: Two level;Able to live on main level with bedroom/bathroom     Bathroom Shower/Tub: Teacher, early years/pre: Standard     Home Equipment: Cane - single point;Walker - 2 wheels          Prior Functioning/Environment Level of Independence: Independent        Comments: reports independent ADLs, granddaughter assists as needed, pt drives and shops         OT Problem List: Decreased activity tolerance;Impaired balance (sitting and/or standing);Decreased safety awareness;Decreased knowledge of use of DME or AE;Decreased knowledge of precautions;Cardiopulmonary status limiting activity      OT Treatment/Interventions: Self-care/ADL training;DME and/or AE instruction;Therapeutic activities;Cognitive remediation/compensation;Balance training;Patient/family education;Energy conservation    OT Goals(Current goals can be found  in the care plan section) Acute Rehab OT Goals Patient Stated Goal: go home, feel better OT Goal Formulation: With patient Time For Goal Achievement: 12/27/19 Potential to Achieve Goals: Good  OT Frequency: Min 2X/week   Barriers to D/C:            Co-evaluation              AM-PAC OT "6 Clicks" Daily Activity     Outcome Measure Help from another person eating meals?: None Help from another person taking care of personal grooming?: A Little Help from another person toileting, which includes using toliet, bedpan, or urinal?: A Little Help from another person bathing (including washing, rinsing, drying)?: A Little Help from another person to put on and taking off regular upper body clothing?: A Little Help from another person to put on and taking off regular lower body clothing?: A Little 6 Click Score: 19   End of Session Nurse Communication: Mobility status  Activity Tolerance: Patient tolerated treatment well Patient left: in chair;with call bell/phone within reach  OT Visit Diagnosis: Other abnormalities of gait and mobility (R26.89)                Time: 7035-0093 OT Time Calculation (min): 20 min Charges:  OT General Charges $OT Visit: 1 Visit OT Evaluation $OT Eval Low Complexity: 1 Low  [name], OT Acute Rehabilitation Services Pager [phone] Office [phone]   Delight Stare 12/13/2019, 11:18 AM

## 2019-12-13 NOTE — Telephone Encounter (Signed)
patient scheduled for a Hosp F/U with Dr. Havery Moros on 12-1 at 9:00am for Anemia, Melena in setting of supratherapeutic INR.

## 2019-12-13 NOTE — Telephone Encounter (Signed)
-----   Message from Yetta Flock, MD sent at 12/12/2019  2:56 PM EDT ----- Regarding: follow up Hi Jan, This patient will need follow up with me or APP in the next 2 months or so. Thanks!

## 2019-12-13 NOTE — Discharge Summary (Signed)
Physician Discharge Summary  Raymond Pratt RAX:[id number] DOB: [birth year] DOA: 12/10/2019  PCP: Shon Baton, MD  Admit date: 12/10/2019 Discharge date: 12/13/2019  Admitted From: Home Disposition: Home  Recommendations for Outpatient Follow-up:  1. Follow up with PCP in 1-2 weeks 2. Cardiology in 1 to 2 weeks as scheduled  Home Health: None Equipment/Devices: None  Discharge Condition: Stable CODE STATUS: Full Diet recommendation: Cardiac low-salt diet 1200 mL fluid restriction daily  Brief/Interim Summary: 69 year old with a history of CHF, DM 2, A. fib on chronic Coumadin, CKD stage IIIa, and OSA not on CPAP who presented to the ED with worsening fatigue for a few days and severe generalized weakness to the point he cannot get out of bed.  Initial blood pressure was in the 81J systolic.  Hemoglobin was found to be profoundly low at 4.2 with an INR 4.3 and gross melena on exam.  BUN was 94 with a creatinine of 2.1. The patient's anticoagulation was reversed.  He was given PRBC and also diuresed due to pulmonary edema complicating his presentation.  He ultimately underwent an EGD which showed a cratered but nonbleeding duodenal ulcer and gastritis.  He was subsequently transition from a PPI drip to IV twice daily dosing.  Patient admitted as above with worsening fatigue weakness in the setting of profound anemia, hemorrhagic shock and acute blood loss likely upper GI.  Patient was transfused and given fluids ultimately presenting a volume overloaded requiring diuresis given his known history of heart failure.  Fortunately patient's hemoglobin remained stable after transfusion, upper endoscopy was remarkable for duodenal crater ulcers without overt bleeding.  Warfarin was resumed after approval and discussion with GI given recent bleed, otherwise stable and agreeable for discharge back home with close follow-up with PCP, cardiology and GI as indicated and scheduled.  Discharge Diagnoses:   Principal Problem:   Acute blood loss anemia Active Problems:   Type 2 diabetes mellitus with hyperlipidemia (HCC)   Anticoagulated on Coumadin   Chronic combined systolic and diastolic heart failure (HCC)   HTN (hypertension)   A-fib (HCC)   AKI (acute kidney injury) ([place])   Gastrointestinal hemorrhage with melena   Duodenal ulcer  Discharge Instructions  Discharge Instructions    Call MD for:  difficulty breathing, headache or visual disturbances   Complete by: As directed    Call MD for:  extreme fatigue   Complete by: As directed    Diet - low sodium heart healthy   Complete by: As directed    Fluid restrict to 1200 mL/day   Increase activity slowly   Complete by: As directed      Allergies as of 12/13/2019      Reactions   Actos [pioglitazone] Other (See Comments)   Made the patient retain fluid      Medication List    STOP taking these medications   clindamycin 300 MG capsule Commonly known as: CLEOCIN   predniSONE 20 MG tablet Commonly known as: DELTASONE   traMADol 50 MG tablet Commonly known as: ULTRAM     TAKE these medications   acetaminophen 325 MG tablet Commonly known as: TYLENOL Take 650 mg by mouth every 6 (six) hours as needed for mild pain.   allopurinol 300 MG tablet Commonly known as: ZYLOPRIM Take 300 mg by mouth daily.   aspirin EC 81 MG tablet Take 81 mg by mouth daily. Swallow whole.   atorvastatin 40 MG tablet Commonly known as: LIPITOR Take 40 mg by mouth daily.   cholecalciferol  25 MCG (1000 UNIT) tablet Commonly known as: VITAMIN D3 Take 1,000 Units by mouth daily.   digoxin 0.125 MG tablet Commonly known as: LANOXIN Take 1 tablet by mouth once daily   Entresto 24-26 MG Generic drug: sacubitril-valsartan Take 1 tablet by mouth in the morning and at bedtime. Last refill without office visit. Please call [phone]   guaiFENesin 600 MG 12 hr tablet Commonly known as: MUCINEX Take 1 tablet (600 mg total) by mouth 2  (two) times daily. What changed:   when to take this  reasons to take this   MULTIVITAMIN PO Take 1 tablet by mouth daily.   NovoLIN 70/30 FlexPen Relion (70-30) 100 UNIT/ML KwikPen Generic drug: insulin isophane & regular human Inject 40 Units into the skin See admin instructions. Inject 40 units twice a day., What changed:   how much to take  when to take this  additional instructions   Pacerone 200 MG tablet Generic drug: amiodarone Take 1 tablet by mouth once daily What changed: how much to take   senna-docusate 8.6-50 MG tablet Commonly known as: Senokot-S Take 2 tablets by mouth 2 (two) times daily.   spironolactone 25 MG tablet Commonly known as: ALDACTONE Take 1 tablet (25 mg total) by mouth daily.   torsemide 20 MG tablet Commonly known as: DEMADEX Take 4 tablets (80 mg total) by mouth 2 (two) times daily. Please call for office visit [phone] What changed:   how much to take  when to take this  reasons to take this  additional instructions   warfarin 5 MG tablet Commonly known as: COUMADIN Take 5 mg daily What changed:   how much to take  how to take this  when to take this  additional instructions   ZINC PO Take 1 tablet by mouth daily.       Follow-up Information    [facility]. Go on 12/21/2019.   Why: Please attend your hospital discharge appt with Schuyler Amor, NP, on Friday, 12/21/19, at 1:00pm. Contact information: [address]  [place], [place] [address] P: [phone]             Allergies  Allergen Reactions  . Actos [Pioglitazone] Other (See Comments)    Made the patient retain fluid    Consultations:  GI, Gre[place]   Procedures/Studies: DG Chest Port 1 View  Result Date: 12/10/2019 CLINICAL DATA:  Weakness. EXAM: PORTABLE CHEST 1 VIEW COMPARISON:  12/05/2018 FINDINGS: There is cardiomegaly. There is a retrocardiac opacity at the left lung base. The pulmonary vasculature  appears dilated. There is no pneumothorax or large pleural effusion. There is no acute osseous abnormality. IMPRESSION: 1. Retrocardiac opacity, atelectasis versus infiltrate. 2. Cardiomegaly with pulmonary vascular congestion. 3. Dilated pulmonary arteries which can be seen in patients with elevated pulmonary artery pressures. Electronically Signed   By: Constance Holster M.D.   On: 12/10/2019 01:08     Subjective: No acute issues or events overnight, ambulating around the room today without hypoxia or overt symptoms denies nausea, vomiting, diarrhea, constipation, headache, fevers, chills.   Discharge Exam: Vitals:   12/13/19 0635 12/13/19 0729  BP: 111/64 99/75  Pulse: 74 73  Resp:  16  Temp:    SpO2:  98%   Vitals:   12/12/19 2136 12/12/19 2305 12/13/19 0635 12/13/19 0729  BP: (!) 118/51 96/77 111/64 99/75  Pulse: 74  74 73  Resp: 20   16  Temp: 98.6 F (37 C)     TempSrc: Oral  SpO2: 100%   98%  Weight:      Height:       General:  Pleasantly resting in bed, No acute distress. HEENT:  Normocephalic atraumatic.  Sclerae nonicteric, noninjected.  Extraocular movements intact bilaterally. Neck:  Without mass or deformity.  Trachea is midline. Lungs: Scant bibasilar rales without overt wheeze or rhonchi Heart:  Regular rate and rhythm.  Without murmurs, rubs, or gallops. Abdomen:  Soft, obese, nontender, nondistended.  Without guarding or rebound. Extremities: Without cyanosis, clubbing, or obvious deformity. Vascular:  Dorsalis pedis and posterior tibial pulses palpable bilaterally. Skin:  Warm and dry, no erythema, no ulcerations.   The results of significant diagnostics from this hospitalization (including imaging, microbiology, ancillary and laboratory) are listed below for reference.     Microbiology: Recent Results (from the past 240 hour(s))  Respiratory Panel by RT PCR (Flu A&B, Covid) - Nasopharyngeal Swab     Status: None   Collection Time: 12/10/19  1:56  AM   Specimen: Nasopharyngeal Swab  Result Value Ref Range Status   SARS Coronavirus 2 by RT PCR NEGATIVE NEGATIVE Final    Comment: (NOTE) SARS-CoV-2 target nucleic acids are NOT DETECTED.  The SARS-CoV-2 RNA is generally detectable in upper respiratoy specimens during the acute phase of infection. The lowest concentration of SARS-CoV-2 viral copies this assay can detect is 131 copies/mL. A negative result does not preclude SARS-Cov-2 infection and should not be used as the sole basis for treatment or other patient management decisions. A negative result may occur with  improper specimen collection/handling, submission of specimen other than nasopharyngeal swab, presence of viral mutation(s) within the areas targeted by this assay, and inadequate number of viral copies (<131 copies/mL). A negative result must be combined with clinical observations, patient history, and epidemiological information. The expected result is Negative.  Fact Sheet for Patients:  PinkCheek.be  Fact Sheet for Healthcare Providers:  GravelBags.it  This test is no t yet approved or cleared by the [place] FDA and  has been authorized for detection and/or diagnosis of SARS-CoV-2 by FDA under an Emergency Use Authorization (EUA). This EUA will remain  in effect (meaning this test can be used) for the duration of the COVID-19 declaration under Section 564(b)(1) of the Act, 21 U.S.C. section 360bbb-3(b)(1), unless the authorization is terminated or revoked sooner.     Influenza A by PCR NEGATIVE NEGATIVE Final   Influenza B by PCR NEGATIVE NEGATIVE Final    Comment: (NOTE) The Xpert Xpress SARS-CoV-2/FLU/RSV assay is intended as an aid in  the diagnosis of influenza from Nasopharyngeal swab specimens and  should not be used as a sole basis for treatment. Nasal washings and  aspirates are unacceptable for Xpert Xpress SARS-CoV-2/FLU/RSV   testing.  Fact Sheet for Patients: PinkCheek.be  Fact Sheet for Healthcare Providers: GravelBags.it  This test is not yet approved or cleared by the [place] FDA and  has been authorized for detection and/or diagnosis of SARS-CoV-2 by  FDA under an Emergency Use Authorization (EUA). This EUA will remain  in effect (meaning this test can be used) for the duration of the  Covid-19 declaration under Section 564(b)(1) of the Act, 21  U.S.C. section 360bbb-3(b)(1), unless the authorization is  terminated or revoked. Performed at [facility], [place] [address], [place], [place] [address]      Labs: BNP (last 3 results) Recent Labs    02/07/19 0940 12/10/19 0057  BNP 56.8 809.9*   Basic Metabolic Panel: Recent  Labs  Lab 12/10/19 0046 12/10/19 0344 12/11/19 0649 12/12/19 0247 12/13/19 0026  NA 133* 133* 137 133* 134*  K 4.2 4.2 4.0 4.1 4.3  CL 89* 91* 95* 95* 97*  CO2 30 28  --  28 27  GLUCOSE 187* 182* 164* 142* 141*  BUN 94* 94* 75* 66* 52*  CREATININE 2.16* 2.34* 1.90* 2.01* 1.73*  CALCIUM 7.8* 7.9*  --  7.8* 8.2*  MG  --   --   --   --  2.9*   Liver Function Tests: Recent Labs  Lab 12/10/19 0154 12/13/19 0026  AST 23 62*  ALT 23 50*  ALKPHOS 45 91  BILITOT 1.0 0.4  PROT 6.0* 6.4*  ALBUMIN 2.7* 2.8*   Recent Labs  Lab 12/10/19 0154  LIPASE 45   Recent Labs  Lab 12/10/19 0152  AMMONIA 16   CBC: Recent Labs  Lab 12/10/19 0153 12/10/19 0153 12/10/19 1148 12/10/19 1148 12/10/19 1830 12/10/19 1830 12/11/19 0649 12/11/19 1029 12/12/19 0247 12/12/19 1359 12/13/19 0026  WBC 18.9*   < > 16.4*   < > 15.7*  --   --  12.2* 10.2 8.6 9.1  NEUTROABS 15.5*  --  13.4*  --   --   --   --  10.0*  --   --   --   HGB 4.2*   < > 5.7*   < > 6.7*   < > 7.8* 7.4* 6.9* 8.4* 8.8*  HCT 14.8*   < > 18.7*   < > 21.9*   < > 23.0* 24.8* 23.0* 27.5* 28.8*  MCV 93.1   < > 92.6   < > 93.6  --   --  94.3  93.5 91.7 92.0  PLT 379   < > 353   < > 349  --   --  348 326 309 298   < > = values in this interval not displayed.   Cardiac Enzymes: No results for input(s): CKTOTAL, CKMB, CKMBINDEX, TROPONINI in the last 168 hours. BNP: Invalid input(s): POCBNP CBG: Recent Labs  Lab 12/12/19 1127 12/12/19 1638 12/12/19 1955 12/13/19 0726 12/13/19 1211  GLUCAP 345* 146* 123* 112* 229*   D-Dimer No results for input(s): DDIMER in the last 72 hours. Hgb A1c No results for input(s): HGBA1C in the last 72 hours. Lipid Profile No results for input(s): CHOL, HDL, LDLCALC, TRIG, CHOLHDL, LDLDIRECT in the last 72 hours. Thyroid function studies No results for input(s): TSH, T4TOTAL, T3FREE, THYROIDAB in the last 72 hours.  Invalid input(s): FREET3 Anemia work up No results for input(s): VITAMINB12, FOLATE, FERRITIN, TIBC, IRON, RETICCTPCT in the last 72 hours. Urinalysis    Component Value Date/Time   COLORURINE YELLOW 01/04/2014 [place] 01/04/2014 0955   LABSPEC 1.014 01/04/2014 0955   PHURINE 7.5 01/04/2014 0955   GLUCOSEU NEGATIVE 01/04/2014 0955   HGBUR NEGATIVE 01/04/2014 0955   HGBUR trace-lysed 08/04/2008 1450   BILIRUBINUR NEGATIVE 01/04/2014 0955   [place] 01/04/2014 0955   PROTEINUR NEGATIVE 01/04/2014 0955   UROBILINOGEN 1.0 01/04/2014 0955   NITRITE NEGATIVE 01/04/2014 0955   LEUKOCYTESUR NEGATIVE 01/04/2014 0955   Sepsis Labs Invalid input(s): PROCALCITONIN,  WBC,  LACTICIDVEN Microbiology Recent Results (from the past 240 hour(s))  Respiratory Panel by RT PCR (Flu A&B, Covid) - Nasopharyngeal Swab     Status: None   Collection Time: 12/10/19  1:56 AM   Specimen: Nasopharyngeal Swab  Result Value Ref Range Status   SARS Coronavirus 2 by  RT PCR NEGATIVE NEGATIVE Final    Comment: (NOTE) SARS-CoV-2 target nucleic acids are NOT DETECTED.  The SARS-CoV-2 RNA is generally detectable in upper respiratoy specimens during the acute phase of  infection. The lowest concentration of SARS-CoV-2 viral copies this assay can detect is 131 copies/mL. A negative result does not preclude SARS-Cov-2 infection and should not be used as the sole basis for treatment or other patient management decisions. A negative result may occur with  improper specimen collection/handling, submission of specimen other than nasopharyngeal swab, presence of viral mutation(s) within the areas targeted by this assay, and inadequate number of viral copies (<131 copies/mL). A negative result must be combined with clinical observations, patient history, and epidemiological information. The expected result is Negative.  Fact Sheet for Patients:  PinkCheek.be  Fact Sheet for Healthcare Providers:  GravelBags.it  This test is no t yet approved or cleared by the [place] FDA and  has been authorized for detection and/or diagnosis of SARS-CoV-2 by FDA under an Emergency Use Authorization (EUA). This EUA will remain  in effect (meaning this test can be used) for the duration of the COVID-19 declaration under Section 564(b)(1) of the Act, 21 U.S.C. section 360bbb-3(b)(1), unless the authorization is terminated or revoked sooner.     Influenza A by PCR NEGATIVE NEGATIVE Final   Influenza B by PCR NEGATIVE NEGATIVE Final    Comment: (NOTE) The Xpert Xpress SARS-CoV-2/FLU/RSV assay is intended as an aid in  the diagnosis of influenza from Nasopharyngeal swab specimens and  should not be used as a sole basis for treatment. Nasal washings and  aspirates are unacceptable for Xpert Xpress SARS-CoV-2/FLU/RSV  testing.  Fact Sheet for Patients: PinkCheek.be  Fact Sheet for Healthcare Providers: GravelBags.it  This test is not yet approved or cleared by the [place] FDA and  has been authorized for detection and/or diagnosis of SARS-CoV-2  by  FDA under an Emergency Use Authorization (EUA). This EUA will remain  in effect (meaning this test can be used) for the duration of the  Covid-19 declaration under Section 564(b)(1) of the Act, 21  U.S.C. section 360bbb-3(b)(1), unless the authorization is  terminated or revoked. Performed at [facility], [place] [address] [place], [place] [address]      Time coordinating discharge: Over 30 minutes  SIGNED:   Little Ishikawa, DO [facility] 12/13/2019, 1:02 PM Pager   If 7PM-7AM, please contact night-coverage [email or web address]

## 2019-12-13 NOTE — Care Management Important Message (Signed)
Important Message  Patient Details  Name: Raymond Pratt MRN: [id number] Date of Birth: 08/02/1950   Medicare Important Message Given:  Yes  Signed IM mailed to the patient home address.     [name] 12/13/2019, 12:41 PM

## 2019-12-14 ENCOUNTER — Other Ambulatory Visit (HOSPITAL_COMMUNITY): Payer: Self-pay | Admitting: Internal Medicine

## 2019-12-14 ENCOUNTER — Encounter (HOSPITAL_COMMUNITY): Payer: Self-pay | Admitting: Gastroenterology

## 2019-12-14 DIAGNOSIS — I5042 Chronic combined systolic (congestive) and diastolic (congestive) heart failure: Secondary | ICD-10-CM

## 2019-12-17 ENCOUNTER — Encounter: Payer: Self-pay | Admitting: Gastroenterology

## 2019-12-17 NOTE — Telephone Encounter (Signed)
Called and spoke to pt. He confirmed appointment on Wed. 12-1 at 9:00am with Dr. Havery Moros.

## 2019-12-27 DIAGNOSIS — E785 Hyperlipidemia, unspecified: Secondary | ICD-10-CM | POA: Diagnosis not present

## 2019-12-27 DIAGNOSIS — M109 Gout, unspecified: Secondary | ICD-10-CM | POA: Diagnosis not present

## 2019-12-27 DIAGNOSIS — I4891 Unspecified atrial fibrillation: Secondary | ICD-10-CM | POA: Diagnosis not present

## 2019-12-27 DIAGNOSIS — Z794 Long term (current) use of insulin: Secondary | ICD-10-CM | POA: Diagnosis not present

## 2019-12-27 DIAGNOSIS — M10362 Gout due to renal impairment, left knee: Secondary | ICD-10-CM | POA: Diagnosis not present

## 2019-12-27 DIAGNOSIS — I509 Heart failure, unspecified: Secondary | ICD-10-CM | POA: Diagnosis not present

## 2019-12-27 DIAGNOSIS — Z7901 Long term (current) use of anticoagulants: Secondary | ICD-10-CM | POA: Diagnosis not present

## 2019-12-27 DIAGNOSIS — E1165 Type 2 diabetes mellitus with hyperglycemia: Secondary | ICD-10-CM | POA: Diagnosis not present

## 2019-12-27 DIAGNOSIS — E1169 Type 2 diabetes mellitus with other specified complication: Secondary | ICD-10-CM | POA: Diagnosis not present

## 2019-12-27 DIAGNOSIS — Z8711 Personal history of peptic ulcer disease: Secondary | ICD-10-CM | POA: Diagnosis not present

## 2019-12-27 DIAGNOSIS — I480 Paroxysmal atrial fibrillation: Secondary | ICD-10-CM | POA: Diagnosis not present

## 2019-12-31 ENCOUNTER — Other Ambulatory Visit (HOSPITAL_COMMUNITY): Payer: Self-pay | Admitting: Internal Medicine

## 2020-01-04 DIAGNOSIS — Z7901 Long term (current) use of anticoagulants: Secondary | ICD-10-CM | POA: Diagnosis not present

## 2020-01-04 DIAGNOSIS — I480 Paroxysmal atrial fibrillation: Secondary | ICD-10-CM | POA: Diagnosis not present

## 2020-01-08 ENCOUNTER — Other Ambulatory Visit (HOSPITAL_COMMUNITY): Payer: Self-pay | Admitting: Internal Medicine

## 2020-01-24 DIAGNOSIS — G4733 Obstructive sleep apnea (adult) (pediatric): Secondary | ICD-10-CM | POA: Diagnosis not present

## 2020-01-24 DIAGNOSIS — Z9989 Dependence on other enabling machines and devices: Secondary | ICD-10-CM | POA: Diagnosis not present

## 2020-01-24 DIAGNOSIS — E1159 Type 2 diabetes mellitus with other circulatory complications: Secondary | ICD-10-CM | POA: Diagnosis not present

## 2020-01-24 DIAGNOSIS — N189 Chronic kidney disease, unspecified: Secondary | ICD-10-CM | POA: Diagnosis not present

## 2020-01-24 DIAGNOSIS — I428 Other cardiomyopathies: Secondary | ICD-10-CM | POA: Diagnosis not present

## 2020-01-24 DIAGNOSIS — I152 Hypertension secondary to endocrine disorders: Secondary | ICD-10-CM | POA: Diagnosis not present

## 2020-01-24 DIAGNOSIS — I480 Paroxysmal atrial fibrillation: Secondary | ICD-10-CM | POA: Diagnosis not present

## 2020-01-24 DIAGNOSIS — I5022 Chronic systolic (congestive) heart failure: Secondary | ICD-10-CM | POA: Diagnosis not present

## 2020-02-01 DIAGNOSIS — I080 Rheumatic disorders of both mitral and aortic valves: Secondary | ICD-10-CM | POA: Diagnosis not present

## 2020-02-01 DIAGNOSIS — I517 Cardiomegaly: Secondary | ICD-10-CM | POA: Diagnosis not present

## 2020-02-01 DIAGNOSIS — I519 Heart disease, unspecified: Secondary | ICD-10-CM | POA: Diagnosis not present

## 2020-02-01 DIAGNOSIS — I5189 Other ill-defined heart diseases: Secondary | ICD-10-CM | POA: Diagnosis not present

## 2020-02-06 ENCOUNTER — Ambulatory Visit: Payer: Medicare HMO | Admitting: Gastroenterology

## 2020-02-08 DIAGNOSIS — I480 Paroxysmal atrial fibrillation: Secondary | ICD-10-CM | POA: Diagnosis not present

## 2020-02-08 DIAGNOSIS — Z7901 Long term (current) use of anticoagulants: Secondary | ICD-10-CM | POA: Diagnosis not present

## 2020-02-13 ENCOUNTER — Encounter (HOSPITAL_BASED_OUTPATIENT_CLINIC_OR_DEPARTMENT_OTHER): Payer: Self-pay | Admitting: *Deleted

## 2020-02-13 ENCOUNTER — Emergency Department (HOSPITAL_BASED_OUTPATIENT_CLINIC_OR_DEPARTMENT_OTHER): Payer: Medicare HMO

## 2020-02-13 ENCOUNTER — Other Ambulatory Visit: Payer: Self-pay

## 2020-02-13 ENCOUNTER — Emergency Department (HOSPITAL_BASED_OUTPATIENT_CLINIC_OR_DEPARTMENT_OTHER)
Admission: EM | Admit: 2020-02-13 | Discharge: 2020-02-13 | Disposition: A | Discharge status: Home / Self Care | Payer: Medicare HMO | Attending: Emergency Medicine | Admitting: Emergency Medicine

## 2020-02-13 DIAGNOSIS — R0902 Hypoxemia: Secondary | ICD-10-CM | POA: Diagnosis not present

## 2020-02-13 DIAGNOSIS — M25562 Pain in left knee: Secondary | ICD-10-CM | POA: Insufficient documentation

## 2020-02-13 DIAGNOSIS — B029 Zoster without complications: Secondary | ICD-10-CM | POA: Diagnosis not present

## 2020-02-13 DIAGNOSIS — Z794 Long term (current) use of insulin: Secondary | ICD-10-CM | POA: Diagnosis not present

## 2020-02-13 DIAGNOSIS — Z7982 Long term (current) use of aspirin: Secondary | ICD-10-CM | POA: Diagnosis not present

## 2020-02-13 DIAGNOSIS — E1169 Type 2 diabetes mellitus with other specified complication: Secondary | ICD-10-CM | POA: Diagnosis not present

## 2020-02-13 DIAGNOSIS — M2550 Pain in unspecified joint: Secondary | ICD-10-CM | POA: Diagnosis not present

## 2020-02-13 DIAGNOSIS — R2689 Other abnormalities of gait and mobility: Secondary | ICD-10-CM | POA: Diagnosis not present

## 2020-02-13 DIAGNOSIS — Z7901 Long term (current) use of anticoagulants: Secondary | ICD-10-CM | POA: Insufficient documentation

## 2020-02-13 DIAGNOSIS — I110 Hypertensive heart disease with heart failure: Secondary | ICD-10-CM | POA: Insufficient documentation

## 2020-02-13 DIAGNOSIS — G8929 Other chronic pain: Secondary | ICD-10-CM | POA: Diagnosis not present

## 2020-02-13 DIAGNOSIS — Z79899 Other long term (current) drug therapy: Secondary | ICD-10-CM | POA: Insufficient documentation

## 2020-02-13 DIAGNOSIS — E785 Hyperlipidemia, unspecified: Secondary | ICD-10-CM | POA: Insufficient documentation

## 2020-02-13 DIAGNOSIS — I2510 Atherosclerotic heart disease of native coronary artery without angina pectoris: Secondary | ICD-10-CM | POA: Insufficient documentation

## 2020-02-13 DIAGNOSIS — R52 Pain, unspecified: Secondary | ICD-10-CM | POA: Diagnosis not present

## 2020-02-13 DIAGNOSIS — Z20822 Contact with and (suspected) exposure to covid-19: Secondary | ICD-10-CM | POA: Insufficient documentation

## 2020-02-13 DIAGNOSIS — I5042 Chronic combined systolic (congestive) and diastolic (congestive) heart failure: Secondary | ICD-10-CM | POA: Diagnosis not present

## 2020-02-13 DIAGNOSIS — Z7401 Bed confinement status: Secondary | ICD-10-CM | POA: Diagnosis not present

## 2020-02-13 DIAGNOSIS — R059 Cough, unspecified: Secondary | ICD-10-CM | POA: Diagnosis not present

## 2020-02-13 DIAGNOSIS — M25561 Pain in right knee: Secondary | ICD-10-CM | POA: Diagnosis not present

## 2020-02-13 DIAGNOSIS — I10 Essential (primary) hypertension: Secondary | ICD-10-CM | POA: Diagnosis not present

## 2020-02-13 DIAGNOSIS — R262 Difficulty in walking, not elsewhere classified: Secondary | ICD-10-CM

## 2020-02-13 LAB — BASIC METABOLIC PANEL
Anion gap: 5 (ref 5–15)
BUN: 40 mg/dL — ABNORMAL HIGH (ref 8–23)
CO2: 30 mmol/L (ref 22–32)
Calcium: 8.2 mg/dL — ABNORMAL LOW (ref 8.9–10.3)
Chloride: 103 mmol/L (ref 98–111)
Creatinine, Ser: 1.81 mg/dL — ABNORMAL HIGH (ref 0.61–1.24)
GFR, Estimated: 40 mL/min — ABNORMAL LOW (ref >60)
Glucose, Bld: 155 mg/dL — ABNORMAL HIGH (ref 70–99)
Potassium: 4.7 mmol/L (ref 3.5–5.1)
Sodium: 138 mmol/L (ref 135–145)

## 2020-02-13 LAB — RESP PANEL BY RT-PCR (FLU A&B, COVID) ARPGX2
Influenza A by PCR: NEGATIVE
Influenza B by PCR: NEGATIVE
SARS Coronavirus 2 by RT PCR: NEGATIVE

## 2020-02-13 LAB — CBC WITH DIFFERENTIAL/PLATELET
Abs Immature Granulocytes: 0.04 10*3/uL (ref 0.00–0.07)
Basophils Absolute: 0.1 10*3/uL (ref 0.0–0.1)
Basophils Relative: 1 %
Eosinophils Absolute: 0.2 10*3/uL (ref 0.0–0.5)
Eosinophils Relative: 2 %
HCT: 31.4 % — ABNORMAL LOW (ref 39.0–52.0)
Hemoglobin: 8.4 g/dL — ABNORMAL LOW (ref 13.0–17.0)
Immature Granulocytes: 0 %
Lymphocytes Relative: 9 %
Lymphs Abs: 0.9 10*3/uL (ref 0.7–4.0)
MCH: 21.6 pg — ABNORMAL LOW (ref 26.0–34.0)
MCHC: 26.8 g/dL — ABNORMAL LOW (ref 30.0–36.0)
MCV: 80.7 fL (ref 80.0–100.0)
Monocytes Absolute: 1.3 10*3/uL — ABNORMAL HIGH (ref 0.1–1.0)
Monocytes Relative: 13 %
Neutro Abs: 7.1 10*3/uL (ref 1.7–7.7)
Neutrophils Relative %: 75 %
Platelets: 477 10*3/uL — ABNORMAL HIGH (ref 150–400)
RBC: 3.89 MIL/uL — ABNORMAL LOW (ref 4.22–5.81)
RDW: 18.9 % — ABNORMAL HIGH (ref 11.5–15.5)
WBC: 9.5 10*3/uL (ref 4.0–10.5)
nRBC: 0.3 % — ABNORMAL HIGH (ref 0.0–0.2)

## 2020-02-13 LAB — TROPONIN I (HIGH SENSITIVITY): Troponin I (High Sensitivity): 15 ng/L (ref <18)

## 2020-02-13 LAB — CBG MONITORING, ED: Glucose-Capillary: 144 mg/dL — ABNORMAL HIGH (ref 70–99)

## 2020-02-13 LAB — PROTIME-INR
INR: 2.1 — ABNORMAL HIGH (ref 0.8–1.2)
Prothrombin Time: 22.4 seconds — ABNORMAL HIGH (ref 11.4–15.2)

## 2020-02-13 LAB — BRAIN NATRIURETIC PEPTIDE: B Natriuretic Peptide: 111.6 pg/mL — ABNORMAL HIGH (ref 0.0–100.0)

## 2020-02-13 MED ORDER — PREDNISONE 50 MG PO TABS
60.0000 mg | ORAL_TABLET | Freq: Once | ORAL | Status: AC
  Administered 2020-02-13: 60 mg via ORAL
  Filled 2020-02-13: qty 1

## 2020-02-13 MED ORDER — PREDNISONE 10 MG PO TABS: 60.0000 mg | ORAL_TABLET | Freq: Every day | ORAL | 0 refills | Status: AC
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
## 2020-02-13 NOTE — ED Notes (Signed)
Pt on monitor and vitals cycling 

## 2020-02-13 NOTE — Discharge Instructions (Addendum)
I prescribed another 4 days of prednisone, starting tomorrow, for your gout and shingles.  Our case manager will be reaching out to you or your granddaughter about getting you set up with home health and physical therapy.  I am concerned about your poor mobility.  You are a fall risk because you are taking Coumadin, a blood thinner.  Please use a walker at all times when getting around.

## 2020-02-13 NOTE — ED Triage Notes (Signed)
Pt stated that he has gout flare-up.  Can not walk due to pain.

## 2020-02-14 NOTE — Progress Notes (Signed)
02/14/2020 549 pm TOC CM spoke to pt and offered choice for HH. Pt agreeable to [facility] for [facility]. Message sent to Ronalee Belts [facility] rep with new referral. Pt report having RW, and cane at home. States he just finished an $11000 project to have his bathroom handicap accessible. ED provider updated. [place], [place] ED TOC CM [phone]

## 2020-02-15 DIAGNOSIS — S43011A Anterior subluxation of right humerus, initial encounter: Secondary | ICD-10-CM | POA: Diagnosis not present

## 2020-02-15 DIAGNOSIS — J9602 Acute respiratory failure with hypercapnia: Secondary | ICD-10-CM | POA: Diagnosis not present

## 2020-02-15 DIAGNOSIS — R52 Pain, unspecified: Secondary | ICD-10-CM | POA: Diagnosis not present

## 2020-02-15 DIAGNOSIS — Z7901 Long term (current) use of anticoagulants: Secondary | ICD-10-CM | POA: Diagnosis not present

## 2020-02-15 DIAGNOSIS — I5042 Chronic combined systolic (congestive) and diastolic (congestive) heart failure: Secondary | ICD-10-CM | POA: Diagnosis not present

## 2020-02-15 DIAGNOSIS — R0603 Acute respiratory distress: Secondary | ICD-10-CM | POA: Diagnosis not present

## 2020-02-15 DIAGNOSIS — R0602 Shortness of breath: Secondary | ICD-10-CM | POA: Diagnosis not present

## 2020-02-15 DIAGNOSIS — Z6841 Body Mass Index (BMI) 40.0 and over, adult: Secondary | ICD-10-CM | POA: Diagnosis not present

## 2020-02-15 DIAGNOSIS — J90 Pleural effusion, not elsewhere classified: Secondary | ICD-10-CM | POA: Diagnosis not present

## 2020-02-15 DIAGNOSIS — N179 Acute kidney failure, unspecified: Secondary | ICD-10-CM | POA: Diagnosis not present

## 2020-02-15 DIAGNOSIS — I959 Hypotension, unspecified: Secondary | ICD-10-CM | POA: Diagnosis not present

## 2020-02-15 DIAGNOSIS — M79641 Pain in right hand: Secondary | ICD-10-CM | POA: Diagnosis not present

## 2020-02-15 DIAGNOSIS — D649 Anemia, unspecified: Secondary | ICD-10-CM | POA: Diagnosis not present

## 2020-02-15 DIAGNOSIS — I480 Paroxysmal atrial fibrillation: Secondary | ICD-10-CM | POA: Diagnosis not present

## 2020-02-15 DIAGNOSIS — K922 Gastrointestinal hemorrhage, unspecified: Secondary | ICD-10-CM | POA: Diagnosis not present

## 2020-02-15 DIAGNOSIS — Z743 Need for continuous supervision: Secondary | ICD-10-CM | POA: Diagnosis not present

## 2020-02-15 DIAGNOSIS — E7849 Other hyperlipidemia: Secondary | ICD-10-CM | POA: Diagnosis not present

## 2020-02-15 DIAGNOSIS — M7989 Other specified soft tissue disorders: Secondary | ICD-10-CM | POA: Diagnosis not present

## 2020-02-15 DIAGNOSIS — Z9989 Dependence on other enabling machines and devices: Secondary | ICD-10-CM | POA: Diagnosis not present

## 2020-02-15 DIAGNOSIS — M25519 Pain in unspecified shoulder: Secondary | ICD-10-CM | POA: Diagnosis not present

## 2020-02-15 DIAGNOSIS — I517 Cardiomegaly: Secondary | ICD-10-CM | POA: Diagnosis not present

## 2020-02-15 DIAGNOSIS — I10 Essential (primary) hypertension: Secondary | ICD-10-CM | POA: Diagnosis not present

## 2020-02-15 DIAGNOSIS — R600 Localized edema: Secondary | ICD-10-CM | POA: Diagnosis not present

## 2020-02-15 DIAGNOSIS — M25511 Pain in right shoulder: Secondary | ICD-10-CM | POA: Diagnosis not present

## 2020-02-15 DIAGNOSIS — R609 Edema, unspecified: Secondary | ICD-10-CM | POA: Diagnosis not present

## 2020-02-15 DIAGNOSIS — S43014A Anterior dislocation of right humerus, initial encounter: Secondary | ICD-10-CM | POA: Diagnosis not present

## 2020-02-15 DIAGNOSIS — R0989 Other specified symptoms and signs involving the circulatory and respiratory systems: Secondary | ICD-10-CM | POA: Diagnosis not present

## 2020-02-15 DIAGNOSIS — G4733 Obstructive sleep apnea (adult) (pediatric): Secondary | ICD-10-CM | POA: Diagnosis not present

## 2020-02-15 DIAGNOSIS — E1165 Type 2 diabetes mellitus with hyperglycemia: Secondary | ICD-10-CM | POA: Diagnosis not present

## 2020-02-15 DIAGNOSIS — I130 Hypertensive heart and chronic kidney disease with heart failure and stage 1 through stage 4 chronic kidney disease, or unspecified chronic kidney disease: Secondary | ICD-10-CM | POA: Diagnosis not present

## 2020-02-15 DIAGNOSIS — I429 Cardiomyopathy, unspecified: Secondary | ICD-10-CM | POA: Diagnosis not present

## 2020-02-15 DIAGNOSIS — R279 Unspecified lack of coordination: Secondary | ICD-10-CM | POA: Diagnosis not present

## 2020-02-15 DIAGNOSIS — K269 Duodenal ulcer, unspecified as acute or chronic, without hemorrhage or perforation: Secondary | ICD-10-CM | POA: Diagnosis not present

## 2020-02-15 DIAGNOSIS — E785 Hyperlipidemia, unspecified: Secondary | ICD-10-CM | POA: Diagnosis not present

## 2020-02-15 DIAGNOSIS — S0990XA Unspecified injury of head, initial encounter: Secondary | ICD-10-CM | POA: Diagnosis not present

## 2020-02-15 DIAGNOSIS — Z794 Long term (current) use of insulin: Secondary | ICD-10-CM | POA: Diagnosis not present

## 2020-02-15 DIAGNOSIS — W19XXXA Unspecified fall, initial encounter: Secondary | ICD-10-CM | POA: Diagnosis not present

## 2020-02-15 DIAGNOSIS — S43004A Unspecified dislocation of right shoulder joint, initial encounter: Secondary | ICD-10-CM | POA: Diagnosis not present

## 2020-02-15 DIAGNOSIS — E875 Hyperkalemia: Secondary | ICD-10-CM | POA: Diagnosis not present

## 2020-02-15 DIAGNOSIS — J9600 Acute respiratory failure, unspecified whether with hypoxia or hypercapnia: Secondary | ICD-10-CM | POA: Diagnosis not present

## 2020-02-15 DIAGNOSIS — J9601 Acute respiratory failure with hypoxia: Secondary | ICD-10-CM | POA: Diagnosis not present

## 2020-02-15 DIAGNOSIS — G8911 Acute pain due to trauma: Secondary | ICD-10-CM | POA: Diagnosis not present

## 2020-02-27 ENCOUNTER — Telehealth (HOSPITAL_COMMUNITY): Payer: Self-pay | Admitting: Emergency Medicine

## 2020-02-27 NOTE — Telephone Encounter (Signed)
Called pt regarding monoclonal antibody treatment. Said he fell and is admitted to [facility] in [place]. He is unable to received mAb at this time.

## 2020-03-04 DIAGNOSIS — J9621 Acute and chronic respiratory failure with hypoxia: Secondary | ICD-10-CM | POA: Diagnosis not present

## 2020-03-04 DIAGNOSIS — S43004D Unspecified dislocation of right shoulder joint, subsequent encounter: Secondary | ICD-10-CM | POA: Diagnosis not present

## 2020-03-04 DIAGNOSIS — E662 Morbid (severe) obesity with alveolar hypoventilation: Secondary | ICD-10-CM | POA: Diagnosis not present

## 2020-03-04 DIAGNOSIS — D631 Anemia in chronic kidney disease: Secondary | ICD-10-CM | POA: Diagnosis not present

## 2020-03-04 DIAGNOSIS — I130 Hypertensive heart and chronic kidney disease with heart failure and stage 1 through stage 4 chronic kidney disease, or unspecified chronic kidney disease: Secondary | ICD-10-CM | POA: Diagnosis not present

## 2020-03-04 DIAGNOSIS — N189 Chronic kidney disease, unspecified: Secondary | ICD-10-CM | POA: Diagnosis not present

## 2020-03-04 DIAGNOSIS — R0989 Other specified symptoms and signs involving the circulatory and respiratory systems: Secondary | ICD-10-CM | POA: Diagnosis not present

## 2020-03-04 DIAGNOSIS — I5043 Acute on chronic combined systolic (congestive) and diastolic (congestive) heart failure: Secondary | ICD-10-CM | POA: Diagnosis not present

## 2020-03-04 DIAGNOSIS — Z03818 Encounter for observation for suspected exposure to other biological agents ruled out: Secondary | ICD-10-CM | POA: Diagnosis not present

## 2020-03-04 DIAGNOSIS — R0602 Shortness of breath: Secondary | ICD-10-CM | POA: Diagnosis not present

## 2020-03-04 DIAGNOSIS — G4733 Obstructive sleep apnea (adult) (pediatric): Secondary | ICD-10-CM | POA: Diagnosis not present

## 2020-03-04 DIAGNOSIS — J9602 Acute respiratory failure with hypercapnia: Secondary | ICD-10-CM | POA: Diagnosis not present

## 2020-03-04 DIAGNOSIS — R279 Unspecified lack of coordination: Secondary | ICD-10-CM | POA: Diagnosis not present

## 2020-03-04 DIAGNOSIS — Z20822 Contact with and (suspected) exposure to covid-19: Secondary | ICD-10-CM | POA: Diagnosis not present

## 2020-03-04 DIAGNOSIS — E785 Hyperlipidemia, unspecified: Secondary | ICD-10-CM | POA: Diagnosis not present

## 2020-03-04 DIAGNOSIS — Z6841 Body Mass Index (BMI) 40.0 and over, adult: Secondary | ICD-10-CM | POA: Diagnosis not present

## 2020-03-04 DIAGNOSIS — E1169 Type 2 diabetes mellitus with other specified complication: Secondary | ICD-10-CM | POA: Diagnosis not present

## 2020-03-04 DIAGNOSIS — J9600 Acute respiratory failure, unspecified whether with hypoxia or hypercapnia: Secondary | ICD-10-CM | POA: Diagnosis not present

## 2020-03-04 DIAGNOSIS — R079 Chest pain, unspecified: Secondary | ICD-10-CM | POA: Diagnosis not present

## 2020-03-04 DIAGNOSIS — E1165 Type 2 diabetes mellitus with hyperglycemia: Secondary | ICD-10-CM | POA: Diagnosis not present

## 2020-03-04 DIAGNOSIS — I480 Paroxysmal atrial fibrillation: Secondary | ICD-10-CM | POA: Diagnosis not present

## 2020-03-04 DIAGNOSIS — I959 Hypotension, unspecified: Secondary | ICD-10-CM | POA: Diagnosis not present

## 2020-03-04 DIAGNOSIS — M109 Gout, unspecified: Secondary | ICD-10-CM | POA: Diagnosis not present

## 2020-03-04 DIAGNOSIS — I5023 Acute on chronic systolic (congestive) heart failure: Secondary | ICD-10-CM | POA: Diagnosis not present

## 2020-03-04 DIAGNOSIS — I509 Heart failure, unspecified: Secondary | ICD-10-CM | POA: Diagnosis not present

## 2020-03-04 DIAGNOSIS — K269 Duodenal ulcer, unspecified as acute or chronic, without hemorrhage or perforation: Secondary | ICD-10-CM | POA: Diagnosis not present

## 2020-03-04 DIAGNOSIS — E875 Hyperkalemia: Secondary | ICD-10-CM | POA: Diagnosis not present

## 2020-03-04 DIAGNOSIS — Z7901 Long term (current) use of anticoagulants: Secondary | ICD-10-CM | POA: Diagnosis not present

## 2020-03-04 DIAGNOSIS — E7849 Other hyperlipidemia: Secondary | ICD-10-CM | POA: Diagnosis not present

## 2020-03-04 DIAGNOSIS — J9622 Acute and chronic respiratory failure with hypercapnia: Secondary | ICD-10-CM | POA: Diagnosis not present

## 2020-03-04 DIAGNOSIS — Z743 Need for continuous supervision: Secondary | ICD-10-CM | POA: Diagnosis not present

## 2020-03-04 DIAGNOSIS — I517 Cardiomegaly: Secondary | ICD-10-CM | POA: Diagnosis not present

## 2020-03-04 DIAGNOSIS — I10 Essential (primary) hypertension: Secondary | ICD-10-CM | POA: Diagnosis not present

## 2020-03-05 DIAGNOSIS — Z20822 Contact with and (suspected) exposure to covid-19: Secondary | ICD-10-CM | POA: Diagnosis not present

## 2020-03-05 DIAGNOSIS — Z03818 Encounter for observation for suspected exposure to other biological agents ruled out: Secondary | ICD-10-CM | POA: Diagnosis not present

## 2020-03-06 DIAGNOSIS — I10 Essential (primary) hypertension: Secondary | ICD-10-CM | POA: Diagnosis not present

## 2020-03-06 DIAGNOSIS — G4733 Obstructive sleep apnea (adult) (pediatric): Secondary | ICD-10-CM | POA: Diagnosis not present

## 2020-03-06 DIAGNOSIS — M109 Gout, unspecified: Secondary | ICD-10-CM | POA: Diagnosis not present

## 2020-03-06 DIAGNOSIS — S43004D Unspecified dislocation of right shoulder joint, subsequent encounter: Secondary | ICD-10-CM | POA: Diagnosis not present

## 2020-03-08 DIAGNOSIS — N281 Cyst of kidney, acquired: Secondary | ICD-10-CM | POA: Diagnosis not present

## 2020-03-08 DIAGNOSIS — M2550 Pain in unspecified joint: Secondary | ICD-10-CM | POA: Diagnosis not present

## 2020-03-08 DIAGNOSIS — Z6841 Body Mass Index (BMI) 40.0 and over, adult: Secondary | ICD-10-CM | POA: Diagnosis not present

## 2020-03-08 DIAGNOSIS — J90 Pleural effusion, not elsewhere classified: Secondary | ICD-10-CM | POA: Diagnosis not present

## 2020-03-08 DIAGNOSIS — E1169 Type 2 diabetes mellitus with other specified complication: Secondary | ICD-10-CM | POA: Diagnosis not present

## 2020-03-08 DIAGNOSIS — J9811 Atelectasis: Secondary | ICD-10-CM | POA: Diagnosis not present

## 2020-03-08 DIAGNOSIS — R0989 Other specified symptoms and signs involving the circulatory and respiratory systems: Secondary | ICD-10-CM | POA: Diagnosis not present

## 2020-03-08 DIAGNOSIS — E875 Hyperkalemia: Secondary | ICD-10-CM | POA: Diagnosis not present

## 2020-03-08 DIAGNOSIS — E785 Hyperlipidemia, unspecified: Secondary | ICD-10-CM | POA: Diagnosis not present

## 2020-03-08 DIAGNOSIS — N5089 Other specified disorders of the male genital organs: Secondary | ICD-10-CM | POA: Diagnosis not present

## 2020-03-08 DIAGNOSIS — I480 Paroxysmal atrial fibrillation: Secondary | ICD-10-CM | POA: Diagnosis not present

## 2020-03-08 DIAGNOSIS — K269 Duodenal ulcer, unspecified as acute or chronic, without hemorrhage or perforation: Secondary | ICD-10-CM | POA: Diagnosis not present

## 2020-03-08 DIAGNOSIS — Z7189 Other specified counseling: Secondary | ICD-10-CM | POA: Diagnosis not present

## 2020-03-08 DIAGNOSIS — Z7401 Bed confinement status: Secondary | ICD-10-CM | POA: Diagnosis not present

## 2020-03-08 DIAGNOSIS — K8020 Calculus of gallbladder without cholecystitis without obstruction: Secondary | ICD-10-CM | POA: Diagnosis not present

## 2020-03-08 DIAGNOSIS — J9621 Acute and chronic respiratory failure with hypoxia: Secondary | ICD-10-CM | POA: Diagnosis not present

## 2020-03-08 DIAGNOSIS — Z515 Encounter for palliative care: Secondary | ICD-10-CM | POA: Diagnosis not present

## 2020-03-08 DIAGNOSIS — J9622 Acute and chronic respiratory failure with hypercapnia: Secondary | ICD-10-CM | POA: Diagnosis not present

## 2020-03-08 DIAGNOSIS — R918 Other nonspecific abnormal finding of lung field: Secondary | ICD-10-CM | POA: Diagnosis not present

## 2020-03-08 DIAGNOSIS — E1165 Type 2 diabetes mellitus with hyperglycemia: Secondary | ICD-10-CM | POA: Diagnosis not present

## 2020-03-08 DIAGNOSIS — Z20822 Contact with and (suspected) exposure to covid-19: Secondary | ICD-10-CM | POA: Diagnosis not present

## 2020-03-08 DIAGNOSIS — E662 Morbid (severe) obesity with alveolar hypoventilation: Secondary | ICD-10-CM | POA: Diagnosis not present

## 2020-03-08 DIAGNOSIS — N179 Acute kidney failure, unspecified: Secondary | ICD-10-CM | POA: Diagnosis not present

## 2020-03-08 DIAGNOSIS — R079 Chest pain, unspecified: Secondary | ICD-10-CM | POA: Diagnosis not present

## 2020-03-08 DIAGNOSIS — I509 Heart failure, unspecified: Secondary | ICD-10-CM | POA: Diagnosis not present

## 2020-03-08 DIAGNOSIS — I130 Hypertensive heart and chronic kidney disease with heart failure and stage 1 through stage 4 chronic kidney disease, or unspecified chronic kidney disease: Secondary | ICD-10-CM | POA: Diagnosis not present

## 2020-03-08 DIAGNOSIS — J984 Other disorders of lung: Secondary | ICD-10-CM | POA: Diagnosis not present

## 2020-03-08 DIAGNOSIS — G4733 Obstructive sleep apnea (adult) (pediatric): Secondary | ICD-10-CM | POA: Diagnosis not present

## 2020-03-08 DIAGNOSIS — I083 Combined rheumatic disorders of mitral, aortic and tricuspid valves: Secondary | ICD-10-CM | POA: Diagnosis not present

## 2020-03-08 DIAGNOSIS — N189 Chronic kidney disease, unspecified: Secondary | ICD-10-CM | POA: Diagnosis not present

## 2020-03-08 DIAGNOSIS — R5381 Other malaise: Secondary | ICD-10-CM | POA: Diagnosis not present

## 2020-03-08 DIAGNOSIS — D631 Anemia in chronic kidney disease: Secondary | ICD-10-CM | POA: Diagnosis not present

## 2020-03-08 DIAGNOSIS — R0602 Shortness of breath: Secondary | ICD-10-CM | POA: Diagnosis not present

## 2020-03-08 DIAGNOSIS — Z7901 Long term (current) use of anticoagulants: Secondary | ICD-10-CM | POA: Diagnosis not present

## 2020-03-08 DIAGNOSIS — E1159 Type 2 diabetes mellitus with other circulatory complications: Secondary | ICD-10-CM | POA: Diagnosis not present

## 2020-03-08 DIAGNOSIS — N1832 Chronic kidney disease, stage 3b: Secondary | ICD-10-CM | POA: Diagnosis not present

## 2020-03-08 DIAGNOSIS — I2510 Atherosclerotic heart disease of native coronary artery without angina pectoris: Secondary | ICD-10-CM | POA: Diagnosis not present

## 2020-03-08 DIAGNOSIS — J9602 Acute respiratory failure with hypercapnia: Secondary | ICD-10-CM | POA: Diagnosis not present

## 2020-03-08 DIAGNOSIS — R601 Generalized edema: Secondary | ICD-10-CM | POA: Diagnosis not present

## 2020-03-08 DIAGNOSIS — I5043 Acute on chronic combined systolic (congestive) and diastolic (congestive) heart failure: Secondary | ICD-10-CM | POA: Diagnosis not present

## 2020-03-08 DIAGNOSIS — I5023 Acute on chronic systolic (congestive) heart failure: Secondary | ICD-10-CM | POA: Diagnosis not present

## 2020-03-08 DIAGNOSIS — N433 Hydrocele, unspecified: Secondary | ICD-10-CM | POA: Diagnosis not present

## 2020-03-08 DIAGNOSIS — Z9989 Dependence on other enabling machines and devices: Secondary | ICD-10-CM | POA: Diagnosis not present

## 2020-03-08 DIAGNOSIS — I152 Hypertension secondary to endocrine disorders: Secondary | ICD-10-CM | POA: Diagnosis not present

## 2020-03-08 DIAGNOSIS — J811 Chronic pulmonary edema: Secondary | ICD-10-CM | POA: Diagnosis not present

## 2020-03-08 DIAGNOSIS — I517 Cardiomegaly: Secondary | ICD-10-CM | POA: Diagnosis not present

## 2020-03-09 DIAGNOSIS — I5023 Acute on chronic systolic (congestive) heart failure: Secondary | ICD-10-CM | POA: Diagnosis not present

## 2020-03-09 DIAGNOSIS — I509 Heart failure, unspecified: Secondary | ICD-10-CM | POA: Diagnosis not present

## 2020-03-10 DIAGNOSIS — Z515 Encounter for palliative care: Secondary | ICD-10-CM | POA: Diagnosis not present

## 2020-03-10 DIAGNOSIS — Z6841 Body Mass Index (BMI) 40.0 and over, adult: Secondary | ICD-10-CM | POA: Diagnosis not present

## 2020-03-10 DIAGNOSIS — J9622 Acute and chronic respiratory failure with hypercapnia: Secondary | ICD-10-CM | POA: Diagnosis not present

## 2020-03-10 DIAGNOSIS — I5023 Acute on chronic systolic (congestive) heart failure: Secondary | ICD-10-CM | POA: Diagnosis not present

## 2020-03-10 DIAGNOSIS — N179 Acute kidney failure, unspecified: Secondary | ICD-10-CM | POA: Diagnosis not present

## 2020-03-10 DIAGNOSIS — R0602 Shortness of breath: Secondary | ICD-10-CM | POA: Diagnosis not present

## 2020-03-10 DIAGNOSIS — N1832 Chronic kidney disease, stage 3b: Secondary | ICD-10-CM | POA: Diagnosis not present

## 2020-03-10 DIAGNOSIS — E1165 Type 2 diabetes mellitus with hyperglycemia: Secondary | ICD-10-CM | POA: Diagnosis not present

## 2020-03-10 DIAGNOSIS — E662 Morbid (severe) obesity with alveolar hypoventilation: Secondary | ICD-10-CM | POA: Diagnosis not present

## 2020-03-10 DIAGNOSIS — J9621 Acute and chronic respiratory failure with hypoxia: Secondary | ICD-10-CM | POA: Diagnosis not present

## 2020-03-11 DIAGNOSIS — I5023 Acute on chronic systolic (congestive) heart failure: Secondary | ICD-10-CM | POA: Diagnosis not present

## 2020-03-11 DIAGNOSIS — Z6841 Body Mass Index (BMI) 40.0 and over, adult: Secondary | ICD-10-CM | POA: Diagnosis not present

## 2020-03-11 DIAGNOSIS — R0602 Shortness of breath: Secondary | ICD-10-CM | POA: Diagnosis not present

## 2020-03-11 DIAGNOSIS — I480 Paroxysmal atrial fibrillation: Secondary | ICD-10-CM | POA: Diagnosis not present

## 2020-03-11 DIAGNOSIS — Z7189 Other specified counseling: Secondary | ICD-10-CM | POA: Diagnosis not present

## 2020-03-11 DIAGNOSIS — J9602 Acute respiratory failure with hypercapnia: Secondary | ICD-10-CM | POA: Diagnosis not present

## 2020-03-11 DIAGNOSIS — R601 Generalized edema: Secondary | ICD-10-CM | POA: Diagnosis not present

## 2020-03-11 DIAGNOSIS — I083 Combined rheumatic disorders of mitral, aortic and tricuspid valves: Secondary | ICD-10-CM | POA: Diagnosis not present

## 2020-03-11 DIAGNOSIS — Z515 Encounter for palliative care: Secondary | ICD-10-CM | POA: Diagnosis not present

## 2020-03-11 DIAGNOSIS — K269 Duodenal ulcer, unspecified as acute or chronic, without hemorrhage or perforation: Secondary | ICD-10-CM | POA: Diagnosis not present

## 2020-03-12 ENCOUNTER — Other Ambulatory Visit: Payer: Self-pay

## 2020-03-12 DIAGNOSIS — I5023 Acute on chronic systolic (congestive) heart failure: Secondary | ICD-10-CM | POA: Diagnosis not present

## 2020-03-12 DIAGNOSIS — I509 Heart failure, unspecified: Secondary | ICD-10-CM | POA: Diagnosis not present

## 2020-03-12 NOTE — Patient Outreach (Signed)
[facility]) Care Management  03/12/2020  Raymond Pratt 05-16-1950 [id number]   Referral Date: 03/12/20 Referral Source: [employer] Report Date of Discharge: 03/08/20 Facility:  [place]   Referral received.  No outreach warranted at this time.  Patient currently hospitalized.    Plan: RN CM will close case.    Jone Baseman, RN, MSN [facility] Care Management Care Management Coordinator Direct Line [phone] Toll Free: [phone]  Fax: [phone]

## 2020-03-13 DIAGNOSIS — J9602 Acute respiratory failure with hypercapnia: Secondary | ICD-10-CM | POA: Diagnosis not present

## 2020-03-13 DIAGNOSIS — I5023 Acute on chronic systolic (congestive) heart failure: Secondary | ICD-10-CM | POA: Diagnosis not present

## 2020-03-13 DIAGNOSIS — E1159 Type 2 diabetes mellitus with other circulatory complications: Secondary | ICD-10-CM | POA: Diagnosis not present

## 2020-03-13 DIAGNOSIS — I152 Hypertension secondary to endocrine disorders: Secondary | ICD-10-CM | POA: Diagnosis not present

## 2020-03-13 DIAGNOSIS — I480 Paroxysmal atrial fibrillation: Secondary | ICD-10-CM | POA: Diagnosis not present

## 2020-03-13 DIAGNOSIS — Z515 Encounter for palliative care: Secondary | ICD-10-CM | POA: Diagnosis not present

## 2020-03-13 DIAGNOSIS — R601 Generalized edema: Secondary | ICD-10-CM | POA: Diagnosis not present

## 2020-03-13 DIAGNOSIS — Z7189 Other specified counseling: Secondary | ICD-10-CM | POA: Diagnosis not present

## 2020-03-13 DIAGNOSIS — E662 Morbid (severe) obesity with alveolar hypoventilation: Secondary | ICD-10-CM | POA: Diagnosis not present

## 2020-03-13 DIAGNOSIS — K269 Duodenal ulcer, unspecified as acute or chronic, without hemorrhage or perforation: Secondary | ICD-10-CM | POA: Diagnosis not present

## 2020-03-13 DIAGNOSIS — E1165 Type 2 diabetes mellitus with hyperglycemia: Secondary | ICD-10-CM | POA: Diagnosis not present

## 2020-03-13 DIAGNOSIS — Z6841 Body Mass Index (BMI) 40.0 and over, adult: Secondary | ICD-10-CM | POA: Diagnosis not present

## 2020-03-13 DIAGNOSIS — I509 Heart failure, unspecified: Secondary | ICD-10-CM | POA: Diagnosis not present

## 2020-03-14 DIAGNOSIS — E662 Morbid (severe) obesity with alveolar hypoventilation: Secondary | ICD-10-CM | POA: Diagnosis not present

## 2020-03-14 DIAGNOSIS — I480 Paroxysmal atrial fibrillation: Secondary | ICD-10-CM | POA: Diagnosis not present

## 2020-03-14 DIAGNOSIS — Z6841 Body Mass Index (BMI) 40.0 and over, adult: Secondary | ICD-10-CM | POA: Diagnosis not present

## 2020-03-14 DIAGNOSIS — I5023 Acute on chronic systolic (congestive) heart failure: Secondary | ICD-10-CM | POA: Diagnosis not present

## 2020-03-14 DIAGNOSIS — E1165 Type 2 diabetes mellitus with hyperglycemia: Secondary | ICD-10-CM | POA: Diagnosis not present

## 2020-03-14 DIAGNOSIS — E1159 Type 2 diabetes mellitus with other circulatory complications: Secondary | ICD-10-CM | POA: Diagnosis not present

## 2020-03-14 DIAGNOSIS — I152 Hypertension secondary to endocrine disorders: Secondary | ICD-10-CM | POA: Diagnosis not present

## 2020-03-14 DIAGNOSIS — K269 Duodenal ulcer, unspecified as acute or chronic, without hemorrhage or perforation: Secondary | ICD-10-CM | POA: Diagnosis not present

## 2020-03-15 DIAGNOSIS — E662 Morbid (severe) obesity with alveolar hypoventilation: Secondary | ICD-10-CM | POA: Diagnosis not present

## 2020-03-15 DIAGNOSIS — R601 Generalized edema: Secondary | ICD-10-CM | POA: Diagnosis not present

## 2020-03-15 DIAGNOSIS — I152 Hypertension secondary to endocrine disorders: Secondary | ICD-10-CM | POA: Diagnosis not present

## 2020-03-15 DIAGNOSIS — Z6841 Body Mass Index (BMI) 40.0 and over, adult: Secondary | ICD-10-CM | POA: Diagnosis not present

## 2020-03-15 DIAGNOSIS — Z7189 Other specified counseling: Secondary | ICD-10-CM | POA: Diagnosis not present

## 2020-03-15 DIAGNOSIS — K269 Duodenal ulcer, unspecified as acute or chronic, without hemorrhage or perforation: Secondary | ICD-10-CM | POA: Diagnosis not present

## 2020-03-15 DIAGNOSIS — E1159 Type 2 diabetes mellitus with other circulatory complications: Secondary | ICD-10-CM | POA: Diagnosis not present

## 2020-03-15 DIAGNOSIS — Z515 Encounter for palliative care: Secondary | ICD-10-CM | POA: Diagnosis not present

## 2020-03-15 DIAGNOSIS — E1165 Type 2 diabetes mellitus with hyperglycemia: Secondary | ICD-10-CM | POA: Diagnosis not present

## 2020-03-15 DIAGNOSIS — J9602 Acute respiratory failure with hypercapnia: Secondary | ICD-10-CM | POA: Diagnosis not present

## 2020-03-15 DIAGNOSIS — I5023 Acute on chronic systolic (congestive) heart failure: Secondary | ICD-10-CM | POA: Diagnosis not present

## 2020-03-15 DIAGNOSIS — I480 Paroxysmal atrial fibrillation: Secondary | ICD-10-CM | POA: Diagnosis not present

## 2020-03-16 DIAGNOSIS — E1159 Type 2 diabetes mellitus with other circulatory complications: Secondary | ICD-10-CM | POA: Diagnosis not present

## 2020-03-16 DIAGNOSIS — E662 Morbid (severe) obesity with alveolar hypoventilation: Secondary | ICD-10-CM | POA: Diagnosis not present

## 2020-03-16 DIAGNOSIS — K269 Duodenal ulcer, unspecified as acute or chronic, without hemorrhage or perforation: Secondary | ICD-10-CM | POA: Diagnosis not present

## 2020-03-16 DIAGNOSIS — E1165 Type 2 diabetes mellitus with hyperglycemia: Secondary | ICD-10-CM | POA: Diagnosis not present

## 2020-03-16 DIAGNOSIS — Z6841 Body Mass Index (BMI) 40.0 and over, adult: Secondary | ICD-10-CM | POA: Diagnosis not present

## 2020-03-16 DIAGNOSIS — I509 Heart failure, unspecified: Secondary | ICD-10-CM | POA: Diagnosis not present

## 2020-03-16 DIAGNOSIS — I480 Paroxysmal atrial fibrillation: Secondary | ICD-10-CM | POA: Diagnosis not present

## 2020-03-16 DIAGNOSIS — I5023 Acute on chronic systolic (congestive) heart failure: Secondary | ICD-10-CM | POA: Diagnosis not present

## 2020-03-16 DIAGNOSIS — I152 Hypertension secondary to endocrine disorders: Secondary | ICD-10-CM | POA: Diagnosis not present

## 2020-03-17 DIAGNOSIS — I480 Paroxysmal atrial fibrillation: Secondary | ICD-10-CM | POA: Diagnosis not present

## 2020-03-17 DIAGNOSIS — Z6841 Body Mass Index (BMI) 40.0 and over, adult: Secondary | ICD-10-CM | POA: Diagnosis not present

## 2020-03-17 DIAGNOSIS — E1159 Type 2 diabetes mellitus with other circulatory complications: Secondary | ICD-10-CM | POA: Diagnosis not present

## 2020-03-17 DIAGNOSIS — K269 Duodenal ulcer, unspecified as acute or chronic, without hemorrhage or perforation: Secondary | ICD-10-CM | POA: Diagnosis not present

## 2020-03-17 DIAGNOSIS — E662 Morbid (severe) obesity with alveolar hypoventilation: Secondary | ICD-10-CM | POA: Diagnosis not present

## 2020-03-17 DIAGNOSIS — I5023 Acute on chronic systolic (congestive) heart failure: Secondary | ICD-10-CM | POA: Diagnosis not present

## 2020-03-17 DIAGNOSIS — E1165 Type 2 diabetes mellitus with hyperglycemia: Secondary | ICD-10-CM | POA: Diagnosis not present

## 2020-03-17 DIAGNOSIS — I152 Hypertension secondary to endocrine disorders: Secondary | ICD-10-CM | POA: Diagnosis not present

## 2020-03-18 DIAGNOSIS — I5041 Acute combined systolic (congestive) and diastolic (congestive) heart failure: Secondary | ICD-10-CM | POA: Diagnosis not present

## 2020-03-18 DIAGNOSIS — J9622 Acute and chronic respiratory failure with hypercapnia: Secondary | ICD-10-CM | POA: Diagnosis not present

## 2020-03-18 DIAGNOSIS — E662 Morbid (severe) obesity with alveolar hypoventilation: Secondary | ICD-10-CM | POA: Diagnosis not present

## 2020-03-19 DIAGNOSIS — E1165 Type 2 diabetes mellitus with hyperglycemia: Secondary | ICD-10-CM | POA: Diagnosis not present

## 2020-03-19 DIAGNOSIS — I420 Dilated cardiomyopathy: Secondary | ICD-10-CM | POA: Diagnosis not present

## 2020-03-19 DIAGNOSIS — I152 Hypertension secondary to endocrine disorders: Secondary | ICD-10-CM | POA: Diagnosis not present

## 2020-03-19 DIAGNOSIS — J9621 Acute and chronic respiratory failure with hypoxia: Secondary | ICD-10-CM | POA: Diagnosis not present

## 2020-03-19 DIAGNOSIS — J449 Chronic obstructive pulmonary disease, unspecified: Secondary | ICD-10-CM | POA: Diagnosis not present

## 2020-03-19 DIAGNOSIS — J9622 Acute and chronic respiratory failure with hypercapnia: Secondary | ICD-10-CM | POA: Diagnosis not present

## 2020-03-19 DIAGNOSIS — I5043 Acute on chronic combined systolic (congestive) and diastolic (congestive) heart failure: Secondary | ICD-10-CM | POA: Diagnosis not present

## 2020-03-19 DIAGNOSIS — E1159 Type 2 diabetes mellitus with other circulatory complications: Secondary | ICD-10-CM | POA: Diagnosis not present

## 2020-03-19 DIAGNOSIS — K269 Duodenal ulcer, unspecified as acute or chronic, without hemorrhage or perforation: Secondary | ICD-10-CM | POA: Diagnosis not present

## 2020-03-20 DIAGNOSIS — J9621 Acute and chronic respiratory failure with hypoxia: Secondary | ICD-10-CM | POA: Diagnosis not present

## 2020-03-20 DIAGNOSIS — I152 Hypertension secondary to endocrine disorders: Secondary | ICD-10-CM | POA: Diagnosis not present

## 2020-03-20 DIAGNOSIS — J9622 Acute and chronic respiratory failure with hypercapnia: Secondary | ICD-10-CM | POA: Diagnosis not present

## 2020-03-20 DIAGNOSIS — K269 Duodenal ulcer, unspecified as acute or chronic, without hemorrhage or perforation: Secondary | ICD-10-CM | POA: Diagnosis not present

## 2020-03-20 DIAGNOSIS — E1159 Type 2 diabetes mellitus with other circulatory complications: Secondary | ICD-10-CM | POA: Diagnosis not present

## 2020-03-20 DIAGNOSIS — I420 Dilated cardiomyopathy: Secondary | ICD-10-CM | POA: Diagnosis not present

## 2020-03-20 DIAGNOSIS — E1165 Type 2 diabetes mellitus with hyperglycemia: Secondary | ICD-10-CM | POA: Diagnosis not present

## 2020-03-20 DIAGNOSIS — J449 Chronic obstructive pulmonary disease, unspecified: Secondary | ICD-10-CM | POA: Diagnosis not present

## 2020-03-20 DIAGNOSIS — I5043 Acute on chronic combined systolic (congestive) and diastolic (congestive) heart failure: Secondary | ICD-10-CM | POA: Diagnosis not present

## 2020-03-26 DIAGNOSIS — J9622 Acute and chronic respiratory failure with hypercapnia: Secondary | ICD-10-CM | POA: Diagnosis not present

## 2020-03-26 DIAGNOSIS — J449 Chronic obstructive pulmonary disease, unspecified: Secondary | ICD-10-CM | POA: Diagnosis not present

## 2020-03-26 DIAGNOSIS — I420 Dilated cardiomyopathy: Secondary | ICD-10-CM | POA: Diagnosis not present

## 2020-03-26 DIAGNOSIS — I5043 Acute on chronic combined systolic (congestive) and diastolic (congestive) heart failure: Secondary | ICD-10-CM | POA: Diagnosis not present

## 2020-03-26 DIAGNOSIS — E1159 Type 2 diabetes mellitus with other circulatory complications: Secondary | ICD-10-CM | POA: Diagnosis not present

## 2020-03-26 DIAGNOSIS — K269 Duodenal ulcer, unspecified as acute or chronic, without hemorrhage or perforation: Secondary | ICD-10-CM | POA: Diagnosis not present

## 2020-03-26 DIAGNOSIS — E1165 Type 2 diabetes mellitus with hyperglycemia: Secondary | ICD-10-CM | POA: Diagnosis not present

## 2020-03-26 DIAGNOSIS — I152 Hypertension secondary to endocrine disorders: Secondary | ICD-10-CM | POA: Diagnosis not present

## 2020-03-26 DIAGNOSIS — J9621 Acute and chronic respiratory failure with hypoxia: Secondary | ICD-10-CM | POA: Diagnosis not present

## 2020-03-27 DIAGNOSIS — I152 Hypertension secondary to endocrine disorders: Secondary | ICD-10-CM | POA: Diagnosis not present

## 2020-03-27 DIAGNOSIS — K269 Duodenal ulcer, unspecified as acute or chronic, without hemorrhage or perforation: Secondary | ICD-10-CM | POA: Diagnosis not present

## 2020-03-27 DIAGNOSIS — E1159 Type 2 diabetes mellitus with other circulatory complications: Secondary | ICD-10-CM | POA: Diagnosis not present

## 2020-03-27 DIAGNOSIS — J449 Chronic obstructive pulmonary disease, unspecified: Secondary | ICD-10-CM | POA: Diagnosis not present

## 2020-03-27 DIAGNOSIS — J9622 Acute and chronic respiratory failure with hypercapnia: Secondary | ICD-10-CM | POA: Diagnosis not present

## 2020-03-27 DIAGNOSIS — E1165 Type 2 diabetes mellitus with hyperglycemia: Secondary | ICD-10-CM | POA: Diagnosis not present

## 2020-03-27 DIAGNOSIS — J9621 Acute and chronic respiratory failure with hypoxia: Secondary | ICD-10-CM | POA: Diagnosis not present

## 2020-03-27 DIAGNOSIS — I420 Dilated cardiomyopathy: Secondary | ICD-10-CM | POA: Diagnosis not present

## 2020-03-27 DIAGNOSIS — I5043 Acute on chronic combined systolic (congestive) and diastolic (congestive) heart failure: Secondary | ICD-10-CM | POA: Diagnosis not present

## 2020-03-28 DIAGNOSIS — I152 Hypertension secondary to endocrine disorders: Secondary | ICD-10-CM | POA: Diagnosis not present

## 2020-03-28 DIAGNOSIS — E1159 Type 2 diabetes mellitus with other circulatory complications: Secondary | ICD-10-CM | POA: Diagnosis not present

## 2020-03-28 DIAGNOSIS — J9621 Acute and chronic respiratory failure with hypoxia: Secondary | ICD-10-CM | POA: Diagnosis not present

## 2020-03-28 DIAGNOSIS — I5043 Acute on chronic combined systolic (congestive) and diastolic (congestive) heart failure: Secondary | ICD-10-CM | POA: Diagnosis not present

## 2020-03-28 DIAGNOSIS — I420 Dilated cardiomyopathy: Secondary | ICD-10-CM | POA: Diagnosis not present

## 2020-03-28 DIAGNOSIS — E1165 Type 2 diabetes mellitus with hyperglycemia: Secondary | ICD-10-CM | POA: Diagnosis not present

## 2020-03-28 DIAGNOSIS — J449 Chronic obstructive pulmonary disease, unspecified: Secondary | ICD-10-CM | POA: Diagnosis not present

## 2020-03-28 DIAGNOSIS — K269 Duodenal ulcer, unspecified as acute or chronic, without hemorrhage or perforation: Secondary | ICD-10-CM | POA: Diagnosis not present

## 2020-03-28 DIAGNOSIS — J9622 Acute and chronic respiratory failure with hypercapnia: Secondary | ICD-10-CM | POA: Diagnosis not present

## 2020-03-31 DIAGNOSIS — I5043 Acute on chronic combined systolic (congestive) and diastolic (congestive) heart failure: Secondary | ICD-10-CM | POA: Diagnosis not present

## 2020-03-31 DIAGNOSIS — E1165 Type 2 diabetes mellitus with hyperglycemia: Secondary | ICD-10-CM | POA: Diagnosis not present

## 2020-03-31 DIAGNOSIS — J9622 Acute and chronic respiratory failure with hypercapnia: Secondary | ICD-10-CM | POA: Diagnosis not present

## 2020-03-31 DIAGNOSIS — I420 Dilated cardiomyopathy: Secondary | ICD-10-CM | POA: Diagnosis not present

## 2020-03-31 DIAGNOSIS — I152 Hypertension secondary to endocrine disorders: Secondary | ICD-10-CM | POA: Diagnosis not present

## 2020-03-31 DIAGNOSIS — J449 Chronic obstructive pulmonary disease, unspecified: Secondary | ICD-10-CM | POA: Diagnosis not present

## 2020-03-31 DIAGNOSIS — J9621 Acute and chronic respiratory failure with hypoxia: Secondary | ICD-10-CM | POA: Diagnosis not present

## 2020-03-31 DIAGNOSIS — K269 Duodenal ulcer, unspecified as acute or chronic, without hemorrhage or perforation: Secondary | ICD-10-CM | POA: Diagnosis not present

## 2020-03-31 DIAGNOSIS — E1159 Type 2 diabetes mellitus with other circulatory complications: Secondary | ICD-10-CM | POA: Diagnosis not present

## 2020-04-02 DIAGNOSIS — E1165 Type 2 diabetes mellitus with hyperglycemia: Secondary | ICD-10-CM | POA: Diagnosis not present

## 2020-04-02 DIAGNOSIS — J9621 Acute and chronic respiratory failure with hypoxia: Secondary | ICD-10-CM | POA: Diagnosis not present

## 2020-04-02 DIAGNOSIS — K269 Duodenal ulcer, unspecified as acute or chronic, without hemorrhage or perforation: Secondary | ICD-10-CM | POA: Diagnosis not present

## 2020-04-02 DIAGNOSIS — I152 Hypertension secondary to endocrine disorders: Secondary | ICD-10-CM | POA: Diagnosis not present

## 2020-04-02 DIAGNOSIS — J9622 Acute and chronic respiratory failure with hypercapnia: Secondary | ICD-10-CM | POA: Diagnosis not present

## 2020-04-02 DIAGNOSIS — I420 Dilated cardiomyopathy: Secondary | ICD-10-CM | POA: Diagnosis not present

## 2020-04-02 DIAGNOSIS — J449 Chronic obstructive pulmonary disease, unspecified: Secondary | ICD-10-CM | POA: Diagnosis not present

## 2020-04-02 DIAGNOSIS — E1159 Type 2 diabetes mellitus with other circulatory complications: Secondary | ICD-10-CM | POA: Diagnosis not present

## 2020-04-02 DIAGNOSIS — I5043 Acute on chronic combined systolic (congestive) and diastolic (congestive) heart failure: Secondary | ICD-10-CM | POA: Diagnosis not present

## 2020-04-03 ENCOUNTER — Inpatient Hospital Stay (HOSPITAL_COMMUNITY)
Admission: EM | Admit: 2020-04-03 | Discharge: 2020-05-06 | DRG: 291 | Disposition: E | Payer: Medicare HMO | Attending: Internal Medicine | Admitting: Internal Medicine

## 2020-04-03 ENCOUNTER — Encounter (HOSPITAL_COMMUNITY): Payer: Self-pay

## 2020-04-03 ENCOUNTER — Emergency Department (HOSPITAL_COMMUNITY): Payer: Medicare HMO

## 2020-04-03 ENCOUNTER — Other Ambulatory Visit: Payer: Self-pay

## 2020-04-03 DIAGNOSIS — Z9989 Dependence on other enabling machines and devices: Secondary | ICD-10-CM | POA: Diagnosis not present

## 2020-04-03 DIAGNOSIS — J9621 Acute and chronic respiratory failure with hypoxia: Secondary | ICD-10-CM | POA: Diagnosis not present

## 2020-04-03 DIAGNOSIS — R911 Solitary pulmonary nodule: Secondary | ICD-10-CM | POA: Diagnosis present

## 2020-04-03 DIAGNOSIS — J811 Chronic pulmonary edema: Secondary | ICD-10-CM | POA: Diagnosis not present

## 2020-04-03 DIAGNOSIS — A4150 Gram-negative sepsis, unspecified: Secondary | ICD-10-CM

## 2020-04-03 DIAGNOSIS — R0600 Dyspnea, unspecified: Secondary | ICD-10-CM | POA: Diagnosis not present

## 2020-04-03 DIAGNOSIS — R140 Abdominal distension (gaseous): Secondary | ICD-10-CM | POA: Diagnosis not present

## 2020-04-03 DIAGNOSIS — E785 Hyperlipidemia, unspecified: Secondary | ICD-10-CM | POA: Diagnosis not present

## 2020-04-03 DIAGNOSIS — Z66 Do not resuscitate: Secondary | ICD-10-CM | POA: Diagnosis not present

## 2020-04-03 DIAGNOSIS — L299 Pruritus, unspecified: Secondary | ICD-10-CM | POA: Diagnosis present

## 2020-04-03 DIAGNOSIS — G9341 Metabolic encephalopathy: Secondary | ICD-10-CM | POA: Diagnosis not present

## 2020-04-03 DIAGNOSIS — E1165 Type 2 diabetes mellitus with hyperglycemia: Secondary | ICD-10-CM | POA: Diagnosis not present

## 2020-04-03 DIAGNOSIS — L8990 Pressure ulcer of unspecified site, unspecified stage: Secondary | ICD-10-CM | POA: Insufficient documentation

## 2020-04-03 DIAGNOSIS — E1169 Type 2 diabetes mellitus with other specified complication: Secondary | ICD-10-CM | POA: Diagnosis present

## 2020-04-03 DIAGNOSIS — R0602 Shortness of breath: Secondary | ICD-10-CM | POA: Diagnosis not present

## 2020-04-03 DIAGNOSIS — Z7189 Other specified counseling: Secondary | ICD-10-CM | POA: Diagnosis not present

## 2020-04-03 DIAGNOSIS — R069 Unspecified abnormalities of breathing: Secondary | ICD-10-CM | POA: Diagnosis not present

## 2020-04-03 DIAGNOSIS — I4891 Unspecified atrial fibrillation: Secondary | ICD-10-CM | POA: Diagnosis present

## 2020-04-03 DIAGNOSIS — N170 Acute kidney failure with tubular necrosis: Secondary | ICD-10-CM | POA: Diagnosis present

## 2020-04-03 DIAGNOSIS — Z452 Encounter for adjustment and management of vascular access device: Secondary | ICD-10-CM | POA: Diagnosis not present

## 2020-04-03 DIAGNOSIS — A4153 Sepsis due to Serratia: Secondary | ICD-10-CM | POA: Diagnosis not present

## 2020-04-03 DIAGNOSIS — G4733 Obstructive sleep apnea (adult) (pediatric): Secondary | ICD-10-CM | POA: Diagnosis not present

## 2020-04-03 DIAGNOSIS — R509 Fever, unspecified: Secondary | ICD-10-CM | POA: Diagnosis not present

## 2020-04-03 DIAGNOSIS — E662 Morbid (severe) obesity with alveolar hypoventilation: Secondary | ICD-10-CM | POA: Diagnosis not present

## 2020-04-03 DIAGNOSIS — Z79899 Other long term (current) drug therapy: Secondary | ICD-10-CM

## 2020-04-03 DIAGNOSIS — E872 Acidosis: Secondary | ICD-10-CM | POA: Diagnosis not present

## 2020-04-03 DIAGNOSIS — L89623 Pressure ulcer of left heel, stage 3: Secondary | ICD-10-CM | POA: Diagnosis not present

## 2020-04-03 DIAGNOSIS — J984 Other disorders of lung: Secondary | ICD-10-CM | POA: Diagnosis not present

## 2020-04-03 DIAGNOSIS — I872 Venous insufficiency (chronic) (peripheral): Secondary | ICD-10-CM | POA: Diagnosis present

## 2020-04-03 DIAGNOSIS — F419 Anxiety disorder, unspecified: Secondary | ICD-10-CM | POA: Diagnosis present

## 2020-04-03 DIAGNOSIS — N186 End stage renal disease: Secondary | ICD-10-CM | POA: Diagnosis present

## 2020-04-03 DIAGNOSIS — E1122 Type 2 diabetes mellitus with diabetic chronic kidney disease: Secondary | ICD-10-CM | POA: Diagnosis present

## 2020-04-03 DIAGNOSIS — Z6841 Body Mass Index (BMI) 40.0 and over, adult: Secondary | ICD-10-CM

## 2020-04-03 DIAGNOSIS — J9601 Acute respiratory failure with hypoxia: Secondary | ICD-10-CM

## 2020-04-03 DIAGNOSIS — E8779 Other fluid overload: Secondary | ICD-10-CM | POA: Diagnosis not present

## 2020-04-03 DIAGNOSIS — I10 Essential (primary) hypertension: Secondary | ICD-10-CM | POA: Diagnosis not present

## 2020-04-03 DIAGNOSIS — R0989 Other specified symptoms and signs involving the circulatory and respiratory systems: Secondary | ICD-10-CM | POA: Diagnosis not present

## 2020-04-03 DIAGNOSIS — J9622 Acute and chronic respiratory failure with hypercapnia: Secondary | ICD-10-CM | POA: Diagnosis not present

## 2020-04-03 DIAGNOSIS — R7881 Bacteremia: Secondary | ICD-10-CM | POA: Diagnosis not present

## 2020-04-03 DIAGNOSIS — I5041 Acute combined systolic (congestive) and diastolic (congestive) heart failure: Secondary | ICD-10-CM | POA: Diagnosis not present

## 2020-04-03 DIAGNOSIS — I517 Cardiomegaly: Secondary | ICD-10-CM | POA: Diagnosis not present

## 2020-04-03 DIAGNOSIS — Z794 Long term (current) use of insulin: Secondary | ICD-10-CM

## 2020-04-03 DIAGNOSIS — R32 Unspecified urinary incontinence: Secondary | ICD-10-CM | POA: Diagnosis not present

## 2020-04-03 DIAGNOSIS — I5020 Unspecified systolic (congestive) heart failure: Secondary | ICD-10-CM | POA: Diagnosis not present

## 2020-04-03 DIAGNOSIS — I5021 Acute systolic (congestive) heart failure: Secondary | ICD-10-CM | POA: Diagnosis not present

## 2020-04-03 DIAGNOSIS — I472 Ventricular tachycardia: Secondary | ICD-10-CM | POA: Diagnosis not present

## 2020-04-03 DIAGNOSIS — I110 Hypertensive heart disease with heart failure: Secondary | ICD-10-CM | POA: Diagnosis not present

## 2020-04-03 DIAGNOSIS — J9811 Atelectasis: Secondary | ICD-10-CM | POA: Diagnosis not present

## 2020-04-03 DIAGNOSIS — I480 Paroxysmal atrial fibrillation: Secondary | ICD-10-CM | POA: Diagnosis not present

## 2020-04-03 DIAGNOSIS — I5043 Acute on chronic combined systolic (congestive) and diastolic (congestive) heart failure: Secondary | ICD-10-CM | POA: Diagnosis present

## 2020-04-03 DIAGNOSIS — D6859 Other primary thrombophilia: Secondary | ICD-10-CM | POA: Diagnosis present

## 2020-04-03 DIAGNOSIS — D649 Anemia, unspecified: Secondary | ICD-10-CM | POA: Diagnosis present

## 2020-04-03 DIAGNOSIS — I132 Hypertensive heart and chronic kidney disease with heart failure and with stage 5 chronic kidney disease, or end stage renal disease: Principal | ICD-10-CM | POA: Diagnosis present

## 2020-04-03 DIAGNOSIS — Z20822 Contact with and (suspected) exposure to covid-19: Secondary | ICD-10-CM | POA: Diagnosis present

## 2020-04-03 DIAGNOSIS — E876 Hypokalemia: Secondary | ICD-10-CM | POA: Diagnosis not present

## 2020-04-03 DIAGNOSIS — D6489 Other specified anemias: Secondary | ICD-10-CM | POA: Diagnosis present

## 2020-04-03 DIAGNOSIS — N179 Acute kidney failure, unspecified: Secondary | ICD-10-CM | POA: Diagnosis not present

## 2020-04-03 DIAGNOSIS — I509 Heart failure, unspecified: Secondary | ICD-10-CM

## 2020-04-03 DIAGNOSIS — R570 Cardiogenic shock: Secondary | ICD-10-CM | POA: Diagnosis not present

## 2020-04-03 DIAGNOSIS — J189 Pneumonia, unspecified organism: Secondary | ICD-10-CM

## 2020-04-03 DIAGNOSIS — E875 Hyperkalemia: Secondary | ICD-10-CM | POA: Diagnosis present

## 2020-04-03 DIAGNOSIS — Z515 Encounter for palliative care: Secondary | ICD-10-CM

## 2020-04-03 DIAGNOSIS — R6521 Severe sepsis with septic shock: Secondary | ICD-10-CM | POA: Diagnosis not present

## 2020-04-03 DIAGNOSIS — E871 Hypo-osmolality and hyponatremia: Secondary | ICD-10-CM | POA: Diagnosis not present

## 2020-04-03 DIAGNOSIS — D631 Anemia in chronic kidney disease: Secondary | ICD-10-CM | POA: Diagnosis present

## 2020-04-03 DIAGNOSIS — Z7901 Long term (current) use of anticoagulants: Secondary | ICD-10-CM | POA: Diagnosis not present

## 2020-04-03 DIAGNOSIS — I4821 Permanent atrial fibrillation: Secondary | ICD-10-CM | POA: Diagnosis present

## 2020-04-03 DIAGNOSIS — T502X5A Adverse effect of carbonic-anhydrase inhibitors, benzothiadiazides and other diuretics, initial encounter: Secondary | ICD-10-CM | POA: Diagnosis not present

## 2020-04-03 DIAGNOSIS — Z7982 Long term (current) use of aspirin: Secondary | ICD-10-CM

## 2020-04-03 DIAGNOSIS — R000 Tachycardia, unspecified: Secondary | ICD-10-CM | POA: Diagnosis not present

## 2020-04-03 DIAGNOSIS — I4819 Other persistent atrial fibrillation: Secondary | ICD-10-CM | POA: Diagnosis not present

## 2020-04-03 DIAGNOSIS — I428 Other cardiomyopathies: Secondary | ICD-10-CM | POA: Diagnosis present

## 2020-04-03 DIAGNOSIS — I2510 Atherosclerotic heart disease of native coronary artery without angina pectoris: Secondary | ICD-10-CM | POA: Diagnosis present

## 2020-04-03 DIAGNOSIS — B9689 Other specified bacterial agents as the cause of diseases classified elsewhere: Secondary | ICD-10-CM | POA: Diagnosis not present

## 2020-04-03 DIAGNOSIS — I5023 Acute on chronic systolic (congestive) heart failure: Secondary | ICD-10-CM

## 2020-04-03 DIAGNOSIS — L89312 Pressure ulcer of right buttock, stage 2: Secondary | ICD-10-CM | POA: Diagnosis present

## 2020-04-03 DIAGNOSIS — I878 Other specified disorders of veins: Secondary | ICD-10-CM | POA: Diagnosis present

## 2020-04-03 DIAGNOSIS — N183 Chronic kidney disease, stage 3 unspecified: Secondary | ICD-10-CM | POA: Diagnosis not present

## 2020-04-03 HISTORY — DX: Dyspnea, unspecified: R06.00

## 2020-04-03 LAB — COMPREHENSIVE METABOLIC PANEL
ALT: 50 U/L — ABNORMAL HIGH (ref 0–44)
AST: 35 U/L (ref 15–41)
Albumin: 3.1 g/dL — ABNORMAL LOW (ref 3.5–5.0)
Alkaline Phosphatase: 92 U/L (ref 38–126)
Anion gap: 10 (ref 5–15)
BUN: 35 mg/dL — ABNORMAL HIGH (ref 8–23)
CO2: 25 mmol/L (ref 22–32)
Calcium: 8.6 mg/dL — ABNORMAL LOW (ref 8.9–10.3)
Chloride: 104 mmol/L (ref 98–111)
Creatinine, Ser: 1.38 mg/dL — ABNORMAL HIGH (ref 0.61–1.24)
GFR, Estimated: 56 mL/min — ABNORMAL LOW (ref >60)
Glucose, Bld: 238 mg/dL — ABNORMAL HIGH (ref 70–99)
Potassium: 5.3 mmol/L — ABNORMAL HIGH (ref 3.5–5.1)
Sodium: 139 mmol/L (ref 135–145)
Total Bilirubin: 0.6 mg/dL (ref 0.3–1.2)
Total Protein: 6.6 g/dL (ref 6.5–8.1)

## 2020-04-03 LAB — CBC WITH DIFFERENTIAL/PLATELET
Abs Immature Granulocytes: 0 10*3/uL (ref 0.00–0.07)
Basophils Absolute: 0 10*3/uL (ref 0.0–0.1)
Basophils Relative: 0 %
Eosinophils Absolute: 0 10*3/uL (ref 0.0–0.5)
Eosinophils Relative: 0 %
HCT: 33.6 % — ABNORMAL LOW (ref 39.0–52.0)
Hemoglobin: 9.2 g/dL — ABNORMAL LOW (ref 13.0–17.0)
Lymphocytes Relative: 7 %
Lymphs Abs: 0.4 10*3/uL — ABNORMAL LOW (ref 0.7–4.0)
MCH: 23.1 pg — ABNORMAL LOW (ref 26.0–34.0)
MCHC: 27.4 g/dL — ABNORMAL LOW (ref 30.0–36.0)
MCV: 84.2 fL (ref 80.0–100.0)
Monocytes Absolute: 0 10*3/uL — ABNORMAL LOW (ref 0.1–1.0)
Monocytes Relative: 0 %
Neutro Abs: 5.9 10*3/uL (ref 1.7–7.7)
Neutrophils Relative %: 93 %
Platelets: 319 10*3/uL (ref 150–400)
RBC: 3.99 MIL/uL — ABNORMAL LOW (ref 4.22–5.81)
RDW: 25.2 % — ABNORMAL HIGH (ref 11.5–15.5)
WBC: 6.3 10*3/uL (ref 4.0–10.5)
nRBC: 0 % (ref 0.0–0.2)
nRBC: 0 /100 WBC

## 2020-04-03 LAB — TROPONIN I (HIGH SENSITIVITY)
Troponin I (High Sensitivity): 20 ng/L — ABNORMAL HIGH (ref <18)
Troponin I (High Sensitivity): 21 ng/L — ABNORMAL HIGH (ref <18)

## 2020-04-03 LAB — POC SARS CORONAVIRUS 2 AG -  ED: SARS Coronavirus 2 Ag: NEGATIVE

## 2020-04-03 LAB — BRAIN NATRIURETIC PEPTIDE: B Natriuretic Peptide: 195 pg/mL — ABNORMAL HIGH (ref 0.0–100.0)

## 2020-04-03 LAB — SARS CORONAVIRUS 2 BY RT PCR (HOSPITAL ORDER, PERFORMED IN ~~LOC~~ HOSPITAL LAB): SARS Coronavirus 2: NEGATIVE

## 2020-04-03 LAB — DIGOXIN LEVEL: Digoxin Level: <0.2 ng/mL — ABNORMAL LOW (ref 0.8–2.0)

## 2020-04-03 LAB — PROTIME-INR
INR: 1.2 (ref 0.8–1.2)
Prothrombin Time: 14.2 seconds (ref 11.4–15.2)

## 2020-04-03 MED ORDER — INSULIN GLARGINE 100 UNIT/ML ~~LOC~~ SOLN
30.0000 [IU] | Freq: Two times a day (BID) | SUBCUTANEOUS | Status: DC
  Administered 2020-04-04 (×3): 30 [IU] via SUBCUTANEOUS
  Filled 2020-04-03 (×6): qty 0.3

## 2020-04-03 MED ORDER — INSULIN ASPART 100 UNIT/ML ~~LOC~~ SOLN
0.0000 [IU] | Freq: Three times a day (TID) | SUBCUTANEOUS | Status: DC
  Administered 2020-04-06: 4 [IU] via SUBCUTANEOUS
  Administered 2020-04-06: 3 [IU] via SUBCUTANEOUS
  Administered 2020-04-07 – 2020-04-08 (×3): 4 [IU] via SUBCUTANEOUS
  Administered 2020-04-08: 3 [IU] via SUBCUTANEOUS
  Administered 2020-04-08 – 2020-04-09 (×3): 4 [IU] via SUBCUTANEOUS
  Administered 2020-04-09 – 2020-04-10 (×2): 7 [IU] via SUBCUTANEOUS
  Administered 2020-04-10: 4 [IU] via SUBCUTANEOUS
  Administered 2020-04-10: 11 [IU] via SUBCUTANEOUS
  Administered 2020-04-11: 7 [IU] via SUBCUTANEOUS
  Administered 2020-04-11: 11 [IU] via SUBCUTANEOUS
  Administered 2020-04-11 – 2020-04-12 (×3): 3 [IU] via SUBCUTANEOUS
  Administered 2020-04-12: 4 [IU] via SUBCUTANEOUS
  Administered 2020-04-13: 11 [IU] via SUBCUTANEOUS
  Administered 2020-04-13 (×2): 4 [IU] via SUBCUTANEOUS
  Administered 2020-04-14: 11 [IU] via SUBCUTANEOUS
  Administered 2020-04-14 (×2): 7 [IU] via SUBCUTANEOUS
  Administered 2020-04-15: 11 [IU] via SUBCUTANEOUS
  Administered 2020-04-15: 7 [IU] via SUBCUTANEOUS
  Administered 2020-04-15: 4 [IU] via SUBCUTANEOUS
  Administered 2020-04-16: 20 [IU] via SUBCUTANEOUS
  Administered 2020-04-16: 11 [IU] via SUBCUTANEOUS
  Administered 2020-04-16: 7 [IU] via SUBCUTANEOUS

## 2020-04-03 MED ORDER — ENOXAPARIN SODIUM 80 MG/0.8ML ~~LOC~~ SOLN
70.0000 mg | SUBCUTANEOUS | Status: DC
  Administered 2020-04-04: 70 mg via SUBCUTANEOUS
  Filled 2020-04-03 (×2): qty 0.7

## 2020-04-03 MED ORDER — ASPIRIN EC 81 MG PO TBEC
81.0000 mg | DELAYED_RELEASE_TABLET | Freq: Every day | ORAL | Status: DC
  Administered 2020-04-04 – 2020-04-05 (×2): 81 mg via ORAL
  Filled 2020-04-03 (×2): qty 1

## 2020-04-03 MED ORDER — POLYETHYLENE GLYCOL 3350 17 G PO PACK
17.0000 g | PACK | Freq: Every day | ORAL | Status: DC | PRN
  Filled 2020-04-03: qty 1

## 2020-04-03 MED ORDER — FUROSEMIDE 10 MG/ML IJ SOLN
60.0000 mg | Freq: Once | INTRAMUSCULAR | Status: AC
  Administered 2020-04-03: 60 mg via INTRAVENOUS
  Filled 2020-04-03 (×2): qty 6

## 2020-04-03 MED ORDER — DIGOXIN 125 MCG PO TABS: 0.1250 mg | ORAL_TABLET | Freq: Every day | ORAL | Status: DC

## 2020-04-03 MED ORDER — FUROSEMIDE 10 MG/ML IJ SOLN
60.0000 mg | Freq: Two times a day (BID) | INTRAMUSCULAR | Status: DC
  Administered 2020-04-04: 60 mg via INTRAVENOUS
  Filled 2020-04-03: qty 6

## 2020-04-03 MED ORDER — METOPROLOL TARTRATE 5 MG/5ML IV SOLN
5.0000 mg | Freq: Once | INTRAVENOUS | Status: AC
  Administered 2020-04-03: 5 mg via INTRAVENOUS
  Filled 2020-04-03: qty 5

## 2020-04-03 MED ORDER — SODIUM CHLORIDE 0.9% FLUSH
3.0000 mL | Freq: Two times a day (BID) | INTRAVENOUS | Status: DC
  Administered 2020-04-03 – 2020-04-20 (×14): 3 mL via INTRAVENOUS

## 2020-04-03 MED ORDER — SACUBITRIL-VALSARTAN 24-26 MG PO TABS
1.0000 | ORAL_TABLET | Freq: Two times a day (BID) | ORAL | Status: DC
  Filled 2020-04-03: qty 1

## 2020-04-03 MED ORDER — ATORVASTATIN CALCIUM 40 MG PO TABS
40.0000 mg | ORAL_TABLET | Freq: Every day | ORAL | Status: DC
  Administered 2020-04-04 – 2020-04-20 (×17): 40 mg via ORAL
  Filled 2020-04-03 (×17): qty 1

## 2020-04-03 MED ORDER — ACETAMINOPHEN 650 MG RE SUPP: 650.0000 mg | Freq: Four times a day (QID) | RECTAL | Status: DC | PRN

## 2020-04-03 MED ORDER — SPIRONOLACTONE 25 MG PO TABS: 25.0000 mg | ORAL_TABLET | Freq: Every day | ORAL | Status: DC

## 2020-04-03 MED ORDER — ACETAMINOPHEN 325 MG PO TABS
650.0000 mg | ORAL_TABLET | Freq: Four times a day (QID) | ORAL | Status: DC | PRN
  Administered 2020-04-04 – 2020-04-19 (×35): 650 mg via ORAL
  Filled 2020-04-03 (×37): qty 2

## 2020-04-03 MED ORDER — FUROSEMIDE 10 MG/ML IJ SOLN: 40.0000 mg | Freq: Two times a day (BID) | INTRAMUSCULAR | Status: DC

## 2020-04-03 MED ORDER — AMIODARONE HCL 200 MG PO TABS
200.0000 mg | ORAL_TABLET | Freq: Every day | ORAL | Status: DC
  Administered 2020-04-04: 200 mg via ORAL
  Filled 2020-04-03: qty 1

## 2020-04-03 NOTE — ED Notes (Signed)
Pt is anxious and hyperventilating. Multiple attempts to coach pt through deep breathing w/ little effectiveness. Pt continues to be hyperverbal and anxious. Pt continuously asks to be able to stand up beside bed. Explained to pt that he is a high risk for falls, it would make him more shob and increase his HR. Pt continues to thrash around in the bed despite redirection.

## 2020-04-03 NOTE — ED Triage Notes (Signed)
Pt arrived to ED via EMS from home w/ c/o shob since last night, lower extremity edema and wt gain. Pt has hx of CHF and reports this feels exactly the same as previous exacerbation. Pt also noted to be in Afib RVR at this time. Pt reports he was shocked back in rhythm here before. Pt normally on 2L O2 via [place] and pt increased O2 to 3L last night. EMS left pt on 3L and pt O2 sats 96%. HR Afib 130's, BP 154/110, CBG 296.   Pt noted to have wound to L heel and shin w/ what appears to be cellulitis and pt reports sacral pressure ulcers as well.
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
## 2020-04-04 ENCOUNTER — Inpatient Hospital Stay (HOSPITAL_COMMUNITY): Payer: Medicare HMO

## 2020-04-04 ENCOUNTER — Inpatient Hospital Stay: Payer: Self-pay

## 2020-04-04 ENCOUNTER — Encounter (HOSPITAL_COMMUNITY): Payer: Self-pay | Admitting: Internal Medicine

## 2020-04-04 DIAGNOSIS — Z452 Encounter for adjustment and management of vascular access device: Secondary | ICD-10-CM | POA: Diagnosis not present

## 2020-04-04 DIAGNOSIS — G4733 Obstructive sleep apnea (adult) (pediatric): Secondary | ICD-10-CM | POA: Diagnosis not present

## 2020-04-04 DIAGNOSIS — J984 Other disorders of lung: Secondary | ICD-10-CM | POA: Diagnosis not present

## 2020-04-04 DIAGNOSIS — E1169 Type 2 diabetes mellitus with other specified complication: Secondary | ICD-10-CM | POA: Diagnosis not present

## 2020-04-04 DIAGNOSIS — I4819 Other persistent atrial fibrillation: Secondary | ICD-10-CM | POA: Diagnosis not present

## 2020-04-04 DIAGNOSIS — I4891 Unspecified atrial fibrillation: Secondary | ICD-10-CM

## 2020-04-04 DIAGNOSIS — L8990 Pressure ulcer of unspecified site, unspecified stage: Secondary | ICD-10-CM | POA: Insufficient documentation

## 2020-04-04 DIAGNOSIS — I517 Cardiomegaly: Secondary | ICD-10-CM | POA: Diagnosis not present

## 2020-04-04 DIAGNOSIS — I480 Paroxysmal atrial fibrillation: Secondary | ICD-10-CM | POA: Diagnosis not present

## 2020-04-04 DIAGNOSIS — I5021 Acute systolic (congestive) heart failure: Secondary | ICD-10-CM | POA: Diagnosis not present

## 2020-04-04 DIAGNOSIS — E785 Hyperlipidemia, unspecified: Secondary | ICD-10-CM | POA: Diagnosis not present

## 2020-04-04 DIAGNOSIS — Z9989 Dependence on other enabling machines and devices: Secondary | ICD-10-CM | POA: Diagnosis not present

## 2020-04-04 DIAGNOSIS — R0989 Other specified symptoms and signs involving the circulatory and respiratory systems: Secondary | ICD-10-CM | POA: Diagnosis not present

## 2020-04-04 DIAGNOSIS — I5023 Acute on chronic systolic (congestive) heart failure: Secondary | ICD-10-CM | POA: Diagnosis not present

## 2020-04-04 DIAGNOSIS — I10 Essential (primary) hypertension: Secondary | ICD-10-CM | POA: Diagnosis not present

## 2020-04-04 LAB — CBG MONITORING, ED
Glucose-Capillary: 104 mg/dL — ABNORMAL HIGH (ref 70–99)
Glucose-Capillary: 79 mg/dL (ref 70–99)

## 2020-04-04 LAB — COMPREHENSIVE METABOLIC PANEL
ALT: 48 U/L — ABNORMAL HIGH (ref 0–44)
AST: 31 U/L (ref 15–41)
Albumin: 3.1 g/dL — ABNORMAL LOW (ref 3.5–5.0)
Alkaline Phosphatase: 81 U/L (ref 38–126)
Anion gap: 8 (ref 5–15)
BUN: 40 mg/dL — ABNORMAL HIGH (ref 8–23)
CO2: 28 mmol/L (ref 22–32)
Calcium: 8.7 mg/dL — ABNORMAL LOW (ref 8.9–10.3)
Chloride: 103 mmol/L (ref 98–111)
Creatinine, Ser: 1.75 mg/dL — ABNORMAL HIGH (ref 0.61–1.24)
GFR, Estimated: 42 mL/min — ABNORMAL LOW (ref >60)
Glucose, Bld: 150 mg/dL — ABNORMAL HIGH (ref 70–99)
Potassium: 4.6 mmol/L (ref 3.5–5.1)
Sodium: 139 mmol/L (ref 135–145)
Total Bilirubin: 0.7 mg/dL (ref 0.3–1.2)
Total Protein: 6.7 g/dL (ref 6.5–8.1)

## 2020-04-04 LAB — GLUCOSE, CAPILLARY
Glucose-Capillary: 104 mg/dL — ABNORMAL HIGH (ref 70–99)
Glucose-Capillary: 122 mg/dL — ABNORMAL HIGH (ref 70–99)

## 2020-04-04 LAB — CBC
HCT: 34.6 % — ABNORMAL LOW (ref 39.0–52.0)
Hemoglobin: 9.4 g/dL — ABNORMAL LOW (ref 13.0–17.0)
MCH: 22.8 pg — ABNORMAL LOW (ref 26.0–34.0)
MCHC: 27.2 g/dL — ABNORMAL LOW (ref 30.0–36.0)
MCV: 84 fL (ref 80.0–100.0)
Platelets: 367 10*3/uL (ref 150–400)
RBC: 4.12 MIL/uL — ABNORMAL LOW (ref 4.22–5.81)
RDW: 25.7 % — ABNORMAL HIGH (ref 11.5–15.5)
WBC: 8.6 10*3/uL (ref 4.0–10.5)
nRBC: 0 % (ref 0.0–0.2)

## 2020-04-04 LAB — ECHOCARDIOGRAM COMPLETE
Height: 67 in
S' Lateral: 3.2 cm
Weight: 5264 oz

## 2020-04-04 LAB — PHOSPHORUS: Phosphorus: 4.1 mg/dL (ref 2.5–4.6)

## 2020-04-04 LAB — MAGNESIUM: Magnesium: 2.3 mg/dL (ref 1.7–2.4)

## 2020-04-04 MED ORDER — FUROSEMIDE 10 MG/ML IJ SOLN
80.0000 mg | Freq: Two times a day (BID) | INTRAMUSCULAR | Status: DC
  Administered 2020-04-04 – 2020-04-05 (×2): 80 mg via INTRAVENOUS
  Filled 2020-04-04 (×2): qty 8

## 2020-04-04 MED ORDER — SODIUM CHLORIDE 0.9% FLUSH: 10.0000 mL | Freq: Two times a day (BID) | INTRAVENOUS | Status: DC

## 2020-04-04 MED ORDER — METOPROLOL TARTRATE 5 MG/5ML IV SOLN
5.0000 mg | Freq: Once | INTRAVENOUS | Status: AC
  Administered 2020-04-04: 5 mg via INTRAVENOUS
  Filled 2020-04-04: qty 5

## 2020-04-04 MED ORDER — SODIUM CHLORIDE 0.9% FLUSH
10.0000 mL | INTRAVENOUS | Status: DC | PRN
  Administered 2020-04-15: 09:00:00 10 mL

## 2020-04-04 MED ORDER — APIXABAN 5 MG PO TABS
5.0000 mg | ORAL_TABLET | Freq: Two times a day (BID) | ORAL | Status: DC
  Administered 2020-04-04 – 2020-04-18 (×28): 5 mg via ORAL
  Filled 2020-04-04 (×28): qty 1

## 2020-04-04 MED ORDER — CHLORHEXIDINE GLUCONATE CLOTH 2 % EX PADS
6.0000 | MEDICATED_PAD | Freq: Every day | CUTANEOUS | Status: DC
  Administered 2020-04-05 – 2020-04-20 (×16): 6 via TOPICAL

## 2020-04-04 MED ORDER — DIPHENHYDRAMINE HCL 25 MG PO CAPS
25.0000 mg | ORAL_CAPSULE | Freq: Four times a day (QID) | ORAL | Status: DC | PRN
  Administered 2020-04-05 – 2020-04-20 (×20): 25 mg via ORAL
  Filled 2020-04-04 (×22): qty 1

## 2020-04-04 MED ORDER — DIPHENHYDRAMINE HCL 50 MG/ML IJ SOLN
25.0000 mg | Freq: Once | INTRAMUSCULAR | Status: AC
  Administered 2020-04-04: 25 mg via INTRAVENOUS
  Filled 2020-04-04: qty 1

## 2020-04-04 MED ORDER — AMIODARONE HCL 200 MG PO TABS
200.0000 mg | ORAL_TABLET | Freq: Two times a day (BID) | ORAL | Status: DC
  Administered 2020-04-04 – 2020-04-05 (×2): 200 mg via ORAL
  Filled 2020-04-04 (×2): qty 1

## 2020-04-04 MED ORDER — METOLAZONE 5 MG PO TABS
5.0000 mg | ORAL_TABLET | Freq: Once | ORAL | Status: AC
  Administered 2020-04-04: 5 mg via ORAL
  Filled 2020-04-04: qty 1

## 2020-04-04 MED ORDER — ONDANSETRON HCL 4 MG/2ML IJ SOLN
4.0000 mg | Freq: Four times a day (QID) | INTRAMUSCULAR | Status: DC | PRN
  Administered 2020-04-11 – 2020-04-14 (×3): 4 mg via INTRAVENOUS
  Filled 2020-04-04 (×3): qty 2

## 2020-04-04 NOTE — Progress Notes (Signed)
Peripherally Inserted Central Catheter Placement  The IV Nurse has discussed with the patient and/or persons authorized to consent for the patient, the purpose of this procedure and the potential benefits and risks involved with this procedure.  The benefits include less needle sticks, lab draws from the catheter, and the patient may be discharged home with the catheter. Risks include, but not limited to, infection, bleeding, blood clot (thrombus formation), and puncture of an artery; nerve damage and irregular heartbeat and possibility to perform a PICC exchange if needed/ordered by physician.  Alternatives to this procedure were also discussed.  Bard Power PICC patient education guide, fact sheet on infection prevention and patient information card has been provided to patient /or left at bedside.    PICC Placement Documentation  PICC Double Lumen 04/04/20 PICC Right Cephalic 47 cm 0 cm (Active)  Indication for Insertion or Continuance of Line Vasoactive infusions 04/04/20 2022  Exposed Catheter (cm) 0 cm 04/04/20 2022  Site Assessment Clean;Dry;Intact 04/04/20 2022  Lumen #1 Status Flushed;Saline locked;Blood return noted 04/04/20 2022  Lumen #2 Status Flushed;Saline locked;Blood return noted 04/04/20 2022  Dressing Type Transparent 04/04/20 2022  Dressing Status Clean;Dry;Intact 04/04/20 2022  Antimicrobial disc in place? Yes 04/04/20 2022  Dressing Intervention New dressing 04/04/20 2022  Dressing Change Due 04/11/20 04/04/20 2022       Gordan Payment 04/04/2020, 8:23 PM

## 2020-04-04 NOTE — Consult Note (Addendum)
[facility] Nurse Consult Note: Reason for Consult: Consult requested for sacrum and leg.  There are no wounds to the sacrum when this location was assessed, however, patient has 2 wounds to inner bilat buttocks.  Right buttock with stage 2: 1X1X.1cm, red and moist, small amt yellow drainage Left buttock with stage 3: 3X2X.2cm, 90% red, 10% yellow interspersed throughout, small amt yellow drainage.  It is difficult to reduce pressure or shear of moisture to the location, since it is located deep in the gluteal cleft.  Left heel with Stage 3 pressure injury; 3X2X.3cm, red and moist, loose peeling skin surrounding, mod amt yellow drainage, no odor Left anterior calf with full thickness wound; 6X3X.2cm, red and moist, wound edges black and dry, mod amt tan drainage, no odor Pressure Injury POA: Yes Plan: Float heels to reduce pressure.  Foam dressing to buttocks to protect and promote healing. Topical treatment orders provided for bedside nurses to perform as follows to promote moist heeling and provide antimicrobial benefits: Apply Aquacel Kellie Simmering # [phone]) to left heel Q day, then cover with foam cup Do not throw away) and kerlex to hold in place Apply Aquacel and foam dressing to left leg wound Q day, then cover with foam dressing.  (Change foam dressing Q 3 days or PRN soiling.) Please re-consult if further assistance is needed.  Thank-you,  Julien Girt MSN, [place], [place], [place], [place]

## 2020-04-04 NOTE — Progress Notes (Signed)
   04/04/20 2025  Assess: MEWS Score  Temp 98.5 F (36.9 C)  BP (!) 153/101  Pulse Rate 73  ECG Heart Rate (!) 115  Resp 20  Level of Consciousness Alert  SpO2 98 %  O2 Device Nasal Cannula  O2 Flow Rate (L/min) 2 L/min  Assess: MEWS Score  MEWS Temp 0  MEWS Systolic 0  MEWS Pulse 2  MEWS RR 0  MEWS LOC 0  MEWS Score 2  MEWS Score Color Yellow  Assess: if the MEWS score is Yellow or Red  Were vital signs taken at a resting state? Yes  Focused Assessment No change from prior assessment  Early Detection of Sepsis Score *See Row Information* Low  MEWS guidelines implemented *See Row Information* No, previously yellow, continue vital signs every 4 hours  Treat  Pain Scale 0-10  Pain Score 4  Pain Location Head
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
## 2020-04-04 NOTE — ED Notes (Signed)
PT continually pulling off lines. Pt counseled about the necessity of lines. Pt c/o of being itchy and can't sit still. RN to make MD aware.

## 2020-04-04 NOTE — Progress Notes (Signed)
RT placed pt on CPAP dream station for the night on home setting of 8CMH2O w/5 Lpm bled into the system. RT will continue to monitor.

## 2020-04-04 NOTE — ED Notes (Signed)
MD paged regarding pt's request for CPAP machine

## 2020-04-04 NOTE — Progress Notes (Signed)
  Echocardiogram 2D Echocardiogram has been performed.  Fidel Levy 04/04/2020, 12:01 PM

## 2020-04-04 NOTE — ED Notes (Signed)
Pt sitting on side of bed reading bible at this time. Advised not to get up/out of bed without utilizing call light for assitance, voiced understanding of same.

## 2020-04-04 NOTE — ED Notes (Signed)
Pt will not keep gown on or cardiac monitor or o2 pulse ox. States they are making him itch.

## 2020-04-04 NOTE — Progress Notes (Signed)
PROGRESS NOTE    Raymond Pratt  [id number] DOB: 1950-07-10 DOA: 03/17/2020 PCP: Shon Baton, MD  Brief Narrative:  HPI per [name] on 03/17/2020 Raymond Pratt is a 70 y.o. male with medical history significant of combined systolic and diastolic heart failure, atrial fibrillation, anemia, cellulitis, GI bleed, hypertension, OSA on CPAP, diabetes, nonobstructive CAD who presents with worsening shortness of breath.             Patient states he has had worsening shortness of breath for the past day.  He also reports edema and states that he checked his weight and noted it has gone up by 30 to 50 pounds.  He states he has been compliant with diet and fluid intake.  He says his warfarin, digoxin, spironolactone were stopped during previous admissions. He states his symptoms are similar to his previous CHF exacerbations.  He typically uses 2 L supplemental O2 at home and had to increase it to 3 L last night and is on 3-4L in the ED.  He also reports per his home scale his weight has increased from 280 pounds to 330 pounds. He denies fevers, chills, chest pain, abdominal pain, constipation, diarrhea, nausea, vomiting.  ED Course: Vital signs significant for 4 L required to maintain saturations, heart rate in the one hundreds to one thirties, respirations in the teens to twenties.  Lab work-up showed BMP with potassium of 5.3, creatinine of 1.38 which is stable, glucose 230.  LFTs showed calcium of 8.6 which corrects when considering albumin of 3.1, ALT 50.  CBC showed hemoglobin of 9.2 which is stable.  PT/INR were normal.  BNP only mildly elevated at 195 the setting of obesity.  Troponin initially 20 with repeat pending.  Covid antigen negative Covid PCR screen pending.  Digoxin level low at less than 0.2.  Chest x-ray showed cardiomegaly, pulmonary vascular congestion and perihilar edema.  Patient started on Lasix cardiology was consulted and will see the patient in the  morning.  **Interim History  Cardiology was consulted and started diuresis on the patient and started anticoagulation.  They are placing a PICC line to monitor his volume status and coags.  Further care per cardiology and his renal function went up slightly.  Will need to continue monitor him extremely carefully given comorbidities.  Assessment & Plan:   Principal Problem:   Acute exacerbation of CHF (congestive heart failure) (HCC) Active Problems:   Type 2 diabetes mellitus with hyperlipidemia (HCC)   OSA on CPAP   Anticoagulated on Coumadin   HTN (hypertension)   A-fib (HCC)   Anemia   Atrial fibrillation with RVR (HCC)   Pressure injury of skin  Acute on chronic systolic CHF exacerbation > Last EF in 2020 was 20-30%, with hypokinesis. > Also A. fib with RVR which is likely worsening his exacerbation and hemodynamics. > States he is compliant with his medications but has had a gradual weight gain edema and shortness of breath and now has had a total of 50 pound weight gain has had multiple multiple liters diuresed during previous admissions. > States he has had digoxin and spironolactone discontinued during recent admissions the spironolactone is possibly related to elevated potassium levels but unclear. - Cardiology consulted in ED and will see the patient in the morning, appreciate recommendations.  Audiology has evaluated him and patient continues to have marked volume overload in the setting of his recurrent atrial fibrillation.  They have increased his IV Lasix and added metolazone and also  placed TED hose.  Cardiology is also starting Eliquis for anticoagulation and recommending a TEE/DCCV once he is diuresed adequately.  They will place a PICC line and follow CVP and coox daily. - Lasix 60 mg IV twice daily - Echocardiogram being repeated - Daily weights - Strict I/O - Is on Delene Loll is currently being held by cardiology as he is getting diuresis. -Cardiology is increase his  diuresis to 80 mg IV twice daily and also given him a dose of metolazone 5 mg p.o. once  Atrial fibrillation with RVR > Presented with Elevated HR ranging from 115-140's > Dilt drip was avoided due to his low output heart failure but he did respond well to single dose of metoprolol -Digoxin Level was <0.2  -Had Uncontrolled rates  - We will start amiodarone infusion if he returns to RVR - Continue home amiodarone at 200 mg p.o. twice daily and cardiology is not recommending an amiodarone drip yet -He has been given IV metoprolol tartrate 5 mg x 2 -Cardiology started anticoagulation with apixaban  Acquired Thrombophilia -In the setting of his atrial fibrillation -Continue apixaban 5 mg p.o. twice daily  AKI on CKD stage IIIa -In the setting of his volume overload and diuresis  -Patient's BUNs/creatinine went from 35/1.3 is now 40/1.75  -Baseline creatinine ranged from 1.5-1.7 and he presented with a baseline creatinine better than his baseline -avoid further nephrotoxic medications, contrast dyes, hypotension and renally dose medications -His Entresto is currently being held -Repeat CMP in a.m. and continue to monitor renal function closely.  Diabetes -Home regimen is 60 units long-acting twice daily -Last hemoglobin A1c was 12.4 and his most recent hospitalization -We will give 30 units long-acting twice daily and SSI with resistant sliding scale AC -CBGs ranging from 79-104  Venous stasis wounds -Patient has stasis changes on lower extremities with wounds on his left lower extremity -No signs of infection at this time, wounds are managed by home health -We will consult wound care  Nonobstructive CAD -Continue home atorvastatin 40 mg and aspirin 81 mg -Cardiology following   OSA Chronic Respiratory Failure with Hypoxia -SpO2: 97 % O2 Flow Rate (L/min): 2 L/min -Continue home CPAP with Home Setting of 8CMH2O with 5 Liters Bleedin  Hyperkalemia -K 5.3 on  admission -We will monitor for now as he will be getting Lasix for CHF exacerbation -K+ now improved to 4.6 -Continue to Monitor and Trend -Delene Loll is currently being held due to hyperkalemia and renal failure -Repeat CMP in the AM  Normocytic Anemia -Patient's Hgb/Hct went from 9.2/33.6 -> 9.4/34.6 -Check Anemia Panel in the AM -Continue to Monitor for S/Sx of Bleeding; Currently no overt bleeding noted -Repeat CBC in the AM   Lung nodule  -Outpatient follow-up in 3 months given that he is 9 mm nodule found on CT scan   Pressure Injuries, poA Pressure Injury 04/04/20 Heel Left Stage 3 -  Full thickness tissue loss. Subcutaneous fat may be visible but bone, tendon or muscle are NOT exposed. (Active)  04/04/20   Location: Heel  Location Orientation: Left  Staging: Stage 3 -  Full thickness tissue loss. Subcutaneous fat may be visible but bone, tendon or muscle are NOT exposed.  Wound Description (Comments):   Present on Admission: Yes     Pressure Injury 04/04/20 Left Stage 3 -  Full thickness tissue loss. Subcutaneous fat may be visible but bone, tendon or muscle are NOT exposed. (Active)  04/04/20   Location:   Location Orientation: Left  Staging: Stage 3 -  Full thickness tissue loss. Subcutaneous fat may be visible but bone, tendon or muscle are NOT exposed.  Wound Description (Comments):   Present on Admission: Yes     Pressure Injury 04/04/20 Buttocks Right Stage 2 -  Partial thickness loss of dermis presenting as a shallow open injury with a red, pink wound bed without slough. (Active)  04/04/20   Location: Buttocks  Location Orientation: Right  Staging: Stage 2 -  Partial thickness loss of dermis presenting as a shallow open injury with a red, pink wound bed without slough.  Wound Description (Comments):   Present on Admission: Yes  -WOC Nurse Consulted   Super Morbid Obesity -Complicates overall prognosis and care -Estimated body mass index is 51.79 kg/m as  calculated from the following:   Height as of this encounter: '5\' 7"'$  (1.702 m).   Weight as of this encounter: 150 kg. -Weight Loss and Dietary Counseling given   DVT prophylaxis: Anticoagulated with Eliquis Code Status: FULL CODE  Family Communication: No family present at bedside Disposition Plan: Pending further cardiac clearance and diuresis back to dry weight  Status is: Inpatient  Remains inpatient appropriate because:Unsafe d/c plan, IV treatments appropriate due to intensity of illness or inability to take PO and Inpatient level of care appropriate due to severity of illness   Dispo: The patient is from: Home              Anticipated d/c is to: TBD              Anticipated d/c date is: 3 days              Patient currently is not medically stable to d/c.   Difficult to place patient No   Consultants:   Cardiology   Procedures:  ECHOCARDIOGRAM  Antimicrobials:  Anti-infectives (From admission, onward)   None        Subjective: Seen and examined at bedside and he was very uncomfortable about to get his echocardiogram.  He is very agitated to and states that he was thinking about leaving if he did not get a room soon.  No nausea or vomiting.  Continues to be on supplemental oxygen and continues remain significantly volume overloaded.  Cardiology following and adjusting medications.  No other concerns or complaints at this time.  Objective: Vitals:   04/04/20 0915 04/04/20 1000 04/04/20 1300 04/04/20 1700  BP: 103/66 107/74    Pulse: (!) 121 (!) 118 79   Resp: (!) 23 (!) 25    Temp:      TempSrc:      SpO2: 99% 97% 97%   Weight:    (!) 150 kg  Height:    '5\' 7"'$  (1.702 m)    Intake/Output Summary (Last 24 hours) at 04/04/2020 1812 Last data filed at 04/04/2020 1734 Gross per 24 hour  Intake 420 ml  Output --  Net 420 ml   Filed Weights   03/31/2020 1945 04/04/20 1700  Weight: (!) 149.2 kg (!) 150 kg   Examination: Physical Exam:  Constitutional: WN/WD  super morbidly obese Caucasian male currently in mild distress and appears uncomfortable Eyes: Lids and conjunctivae normal, sclerae anicteric  ENMT: External Ears, Nose appear normal. Grossly normal hearing.  Neck: Appears normal, supple, no cervical masses, normal ROM, no appreciable thyromegaly; difficult to assess JVD status given his body habitus Respiratory: Diminished to auscultation bilaterally with coarse breath sounds, no wheezing, rales, rhonchi or crackles. Normal respiratory effort and  patient is not tachypenic. No accessory muscle use.  Wearing supplemental oxygen via nasal cannula Cardiovascular: RRR, no murmurs / rubs / gallops. S1 and S2 auscultated.  1-2+ lower extremity edema Abdomen: Soft, non-tender, significantly distended secondary body habitus.  Bowel sounds positive.  GU: Deferred. Musculoskeletal: No clubbing / cyanosis of digits/nails. No joint deformity upper and lower extremities.  Skin: Has lower extremity venous stasis changes in the setting of his significant volume overload. No induration; Warm and dry.  Neurologic: CN 2-12 grossly intact with no focal deficits. Romberg sign and cerebellar reflexes not assessed.  Psychiatric: Normal judgment and insight. Alert and oriented x 3.  Anxious and very agitated mood and appropriate affect.   Data Reviewed: I have personally reviewed following labs and imaging studies  CBC: Recent Labs  Lab 03/12/2020 2019 04/04/20 1449  WBC 6.3 8.6  NEUTROABS 5.9  --   HGB 9.2* 9.4*  HCT 33.6* 34.6*  MCV 84.2 84.0  PLT 319 [id number]   Basic Metabolic Panel: Recent Labs  Lab 03/31/2020 2019 04/04/20 1449  NA 139 139  K 5.3* 4.6  CL 104 103  CO2 25 28  GLUCOSE 238* 150*  BUN 35* 40*  CREATININE 1.38* 1.75*  CALCIUM 8.6* 8.7*  MG  --  2.3  PHOS  --  4.1   GFR: Estimated Creatinine Clearance: 57 mL/min (A) (by C-G formula based on SCr of 1.75 mg/dL (H)). Liver Function Tests: Recent Labs  Lab 04/01/2020 2019 04/04/20 1449   AST 35 31  ALT 50* 48*  ALKPHOS 92 81  BILITOT 0.6 0.7  PROT 6.6 6.7  ALBUMIN 3.1* 3.1*   No results for input(s): LIPASE, AMYLASE in the last 168 hours. No results for input(s): AMMONIA in the last 168 hours. Coagulation Profile: Recent Labs  Lab 04/02/2020 2019  INR 1.2   Cardiac Enzymes: No results for input(s): CKTOTAL, CKMB, CKMBINDEX, TROPONINI in the last 168 hours. BNP (last 3 results) No results for input(s): PROBNP in the last 8760 hours. HbA1C: No results for input(s): HGBA1C in the last 72 hours. CBG: Recent Labs  Lab 04/04/20 0800 04/04/20 1155 04/04/20 1744  GLUCAP 104* 79 104*   Lipid Profile: No results for input(s): CHOL, HDL, LDLCALC, TRIG, CHOLHDL, LDLDIRECT in the last 72 hours. Thyroid Function Tests: No results for input(s): TSH, T4TOTAL, FREET4, T3FREE, THYROIDAB in the last 72 hours. Anemia Panel: No results for input(s): VITAMINB12, FOLATE, FERRITIN, TIBC, IRON, RETICCTPCT in the last 72 hours. Sepsis Labs: No results for input(s): PROCALCITON, LATICACIDVEN in the last 168 hours.  Recent Results (from the past 240 hour(s))  SARS Coronavirus 2 by RT PCR (hospital order, performed in [facility] hospital lab) Nasopharyngeal Nasopharyngeal Swab     Status: None   Collection Time: 03/16/2020  9:51 PM   Specimen: Nasopharyngeal Swab  Result Value Ref Range Status   SARS Coronavirus 2 NEGATIVE NEGATIVE Final    Comment: Performed at [facility], [place] [address], [place], [place] [address]     RN Pressure Injury Documentation: Pressure Injury 04/04/20 Heel Left Stage 3 -  Full thickness tissue loss. Subcutaneous fat may be visible but bone, tendon or muscle are NOT exposed. (Active)  04/04/20   Location: Heel  Location Orientation: Left  Staging: Stage 3 -  Full thickness tissue loss. Subcutaneous fat may be visible but bone, tendon or muscle are NOT exposed.  Wound Description (Comments):   Present on Admission: Yes     Pressure Injury  04/04/20  Left Stage 3 -  Full thickness tissue loss. Subcutaneous fat may be visible but bone, tendon or muscle are NOT exposed. (Active)  04/04/20   Location:   Location Orientation: Left  Staging: Stage 3 -  Full thickness tissue loss. Subcutaneous fat may be visible but bone, tendon or muscle are NOT exposed.  Wound Description (Comments):   Present on Admission: Yes     Pressure Injury 04/04/20 Buttocks Right Stage 2 -  Partial thickness loss of dermis presenting as a shallow open injury with a red, pink wound bed without slough. (Active)  04/04/20   Location: Buttocks  Location Orientation: Right  Staging: Stage 2 -  Partial thickness loss of dermis presenting as a shallow open injury with a red, pink wound bed without slough.  Wound Description (Comments):   Present on Admission: Yes     Estimated body mass index is 51.79 kg/m as calculated from the following:   Height as of this encounter: '5\' 7"'$  (1.702 m).   Weight as of this encounter: 150 kg.  Malnutrition Type:   Malnutrition Characteristics:   Nutrition Interventions:    Radiology Studies: DG Chest Portable 1 View  Result Date: 03/25/2020 CLINICAL DATA:  Shortness of breath since last night. Lower extremity edema and weight gain. History of congestive heart failure. EXAM: PORTABLE CHEST 1 VIEW COMPARISON:  02/13/2020 FINDINGS: Shallow inspiration. Cardiac enlargement with pulmonary vascular congestion and perihilar infiltrates, likely edema. No pleural effusions. No pneumothorax. Mediastinal contours appear intact. IMPRESSION: Cardiac enlargement with pulmonary vascular congestion and perihilar edema. Electronically Signed   By: Lucienne Capers M.D.   On: 03/08/2020 19:57   ECHOCARDIOGRAM COMPLETE  Result Date: 04/04/2020    ECHOCARDIOGRAM REPORT   Patient Name:   Raymond Pratt [facility] Date of Exam: 04/04/2020 Medical Rec #:  [id number]               Height:       67.0 in Accession #:    [id number]              Weight:        329.0 lb Date of Birth:  1951-01-04               BSA:          2.499 m Patient Age:    61 years                BP:           134/83 mmHg Patient Gender: M                       HR:           116 bpm. Exam Location:  Inpatient Procedure: 2D Echo, Cardiac Doppler and Color Doppler Indications:    CHF-Acute Systolic [id number]  History:        Patient has prior history of Echocardiogram examinations, most                 recent 12/06/2018. Cardiomyopathy and CHF, CAD, Arrythmias:Atrial                 Fibrillation; Risk Factors:Hypertension and Diabetes.  Sonographer:    Bernadene Person RDCS Referring Phys: [id number] Candace Gallus MELVIN  Sonographer Comments: Exam ended due to pt refusal during exam. IMPRESSIONS  1. Limited study, patient asked the tech to discontinue the test before it was completed  2. Atrial fibrillation with rapid ventricular response was noted throughout  the study  3. Left ventricular ejection fraction, by estimation, is 20 to 25%. The left ventricle has severely decreased function. The left ventricle demonstrates global hypokinesis. There is mild left ventricular hypertrophy. Left ventricular diastolic function could not be evaluated.  4. Right ventricular systolic function was not well visualized. The right ventricular size is not well visualized. There is moderately elevated pulmonary artery systolic pressure.  5. Left atrial size was mildly dilated.  6. The mitral valve is abnormal. Trivial mitral valve regurgitation.  7. Tricuspid valve regurgitation is mild to moderate.  8. The aortic valve is tricuspid. Aortic valve regurgitation is not visualized. Mild to moderate aortic valve sclerosis/calcification is present, without any evidence of aortic stenosis. Comparison(s): Changes from prior study are noted. 12/06/2018: LVEF 25-30%, global hypokinesis. FINDINGS  Left Ventricle: Left ventricular ejection fraction, by estimation, is 20 to 25%. The left ventricle has severely decreased function. The  left ventricle demonstrates global hypokinesis. The left ventricular internal cavity size was normal in size. There is mild left ventricular hypertrophy. Left ventricular diastolic function could not be evaluated due to atrial fibrillation. Left ventricular diastolic function could not be evaluated. Right Ventricle: The right ventricular size is not well visualized. Right vetricular wall thickness was not well visualized. Right ventricular systolic function was not well visualized. There is moderately elevated pulmonary artery systolic pressure. The  tricuspid regurgitant velocity is 3.47 m/s, and with an assumed right atrial pressure of 8 mmHg, the estimated right ventricular systolic pressure is 99991111 mmHg. Left Atrium: Left atrial size was mildly dilated. Right Atrium: Right atrial size was normal in size. Pericardium: There is no evidence of pericardial effusion. Mitral Valve: The mitral valve is abnormal. There is mild thickening of the mitral valve leaflet(s). Mild to moderate mitral annular calcification. Trivial mitral valve regurgitation. Tricuspid Valve: The tricuspid valve is grossly normal. Tricuspid valve regurgitation is mild to moderate. Aortic Valve: The aortic valve is tricuspid. Aortic valve regurgitation is not visualized. Mild to moderate aortic valve sclerosis/calcification is present, without any evidence of aortic stenosis. Pulmonic Valve: The pulmonic valve was grossly normal. Pulmonic valve regurgitation is trivial. Aorta: The aortic root and ascending aorta are structurally normal, with no evidence of dilitation. IAS/Shunts: No atrial level shunt detected by color flow Doppler.  LEFT VENTRICLE PLAX 2D LVIDd:         5.40 cm LVIDs:         3.20 cm LV PW:         1.00 cm LV IVS:        1.20 cm LVOT diam:     1.90 cm LV SV:         35 LV SV Index:   14 LVOT Area:     2.84 cm  LEFT ATRIUM           Index LA diam:      5.70 cm 2.28 cm/m LA Vol (A4C): 87.9 ml 35.18 ml/m  AORTIC VALVE LVOT Vmax:    69.20 cm/s LVOT Vmean:  52.550 cm/s LVOT VTI:    0.124 m  AORTA Ao Root diam: 3.10 cm Ao Asc diam:  3.00 cm TRICUSPID VALVE TR Peak grad:   48.2 mmHg TR Vmax:        347.00 cm/s  SHUNTS Systemic VTI:  0.12 m Systemic Diam: 1.90 cm Lyman Bishop MD Electronically signed by Lyman Bishop MD Signature Date/Time: 04/04/2020/1:39:04 PM    Final    [place] EKG SITE RITE  Result Date: 04/04/2020 If  Site Rite image not attached, placement could not be confirmed due to current cardiac rhythm.  Scheduled Meds: . amiodarone  200 mg Oral BID  . apixaban  5 mg Oral BID  . aspirin EC  81 mg Oral Daily  . atorvastatin  40 mg Oral Daily  . furosemide  80 mg Intravenous BID  . insulin aspart  0-20 Units Subcutaneous TID WC  . insulin glargine  30 Units Subcutaneous BID  . sodium chloride flush  3 mL Intravenous Q12H   Continuous Infusions:   LOS: 1 day   Kerney Elbe, DO [facility] P[facility]ER is on AMION  If 7PM-7AM, please contact night-coverage [email or web address]

## 2020-04-04 NOTE — ED Notes (Signed)
Attempted report. RN will call back. 

## 2020-04-04 NOTE — ED Notes (Signed)
MD Shaloub made aware of HR 115-140. Pt also c/o of being itchy. MD also made aware.

## 2020-04-04 NOTE — ED Notes (Signed)
Pt up to chair in room.

## 2020-04-05 ENCOUNTER — Inpatient Hospital Stay (HOSPITAL_COMMUNITY): Payer: Medicare HMO

## 2020-04-05 DIAGNOSIS — G4733 Obstructive sleep apnea (adult) (pediatric): Secondary | ICD-10-CM | POA: Diagnosis not present

## 2020-04-05 DIAGNOSIS — I5023 Acute on chronic systolic (congestive) heart failure: Secondary | ICD-10-CM | POA: Diagnosis not present

## 2020-04-05 DIAGNOSIS — E785 Hyperlipidemia, unspecified: Secondary | ICD-10-CM | POA: Diagnosis not present

## 2020-04-05 DIAGNOSIS — I4819 Other persistent atrial fibrillation: Secondary | ICD-10-CM | POA: Diagnosis not present

## 2020-04-05 DIAGNOSIS — J9811 Atelectasis: Secondary | ICD-10-CM | POA: Diagnosis not present

## 2020-04-05 DIAGNOSIS — E1169 Type 2 diabetes mellitus with other specified complication: Secondary | ICD-10-CM | POA: Diagnosis not present

## 2020-04-05 DIAGNOSIS — Z9989 Dependence on other enabling machines and devices: Secondary | ICD-10-CM | POA: Diagnosis not present

## 2020-04-05 DIAGNOSIS — I10 Essential (primary) hypertension: Secondary | ICD-10-CM | POA: Diagnosis not present

## 2020-04-05 DIAGNOSIS — I517 Cardiomegaly: Secondary | ICD-10-CM | POA: Diagnosis not present

## 2020-04-05 DIAGNOSIS — I4891 Unspecified atrial fibrillation: Secondary | ICD-10-CM | POA: Diagnosis not present

## 2020-04-05 DIAGNOSIS — R0602 Shortness of breath: Secondary | ICD-10-CM | POA: Diagnosis not present

## 2020-04-05 DIAGNOSIS — I480 Paroxysmal atrial fibrillation: Secondary | ICD-10-CM | POA: Diagnosis not present

## 2020-04-05 LAB — COMPREHENSIVE METABOLIC PANEL WITH GFR
ALT: 46 U/L — ABNORMAL HIGH (ref 0–44)
AST: 25 U/L (ref 15–41)
Albumin: 3.1 g/dL — ABNORMAL LOW (ref 3.5–5.0)
Alkaline Phosphatase: 82 U/L (ref 38–126)
Anion gap: 8 (ref 5–15)
BUN: 43 mg/dL — ABNORMAL HIGH (ref 8–23)
CO2: 28 mmol/L (ref 22–32)
Calcium: 8.5 mg/dL — ABNORMAL LOW (ref 8.9–10.3)
Chloride: 102 mmol/L (ref 98–111)
Creatinine, Ser: 1.6 mg/dL — ABNORMAL HIGH (ref 0.61–1.24)
GFR, Estimated: 47 mL/min — ABNORMAL LOW (ref >60)
Glucose, Bld: 69 mg/dL — ABNORMAL LOW (ref 70–99)
Potassium: 4.4 mmol/L (ref 3.5–5.1)
Sodium: 138 mmol/L (ref 135–145)
Total Bilirubin: 0.5 mg/dL (ref 0.3–1.2)
Total Protein: 6.4 g/dL — ABNORMAL LOW (ref 6.5–8.1)

## 2020-04-05 LAB — CBC WITH DIFFERENTIAL/PLATELET
Abs Immature Granulocytes: 0.03 10*3/uL (ref 0.00–0.07)
Basophils Absolute: 0.1 10*3/uL (ref 0.0–0.1)
Basophils Relative: 1 %
Eosinophils Absolute: 0.3 10*3/uL (ref 0.0–0.5)
Eosinophils Relative: 4 %
HCT: 31.8 % — ABNORMAL LOW (ref 39.0–52.0)
Hemoglobin: 9 g/dL — ABNORMAL LOW (ref 13.0–17.0)
Immature Granulocytes: 0 %
Lymphocytes Relative: 23 %
Lymphs Abs: 1.7 10*3/uL (ref 0.7–4.0)
MCH: 23.4 pg — ABNORMAL LOW (ref 26.0–34.0)
MCHC: 28.3 g/dL — ABNORMAL LOW (ref 30.0–36.0)
MCV: 82.8 fL (ref 80.0–100.0)
Monocytes Absolute: 0.9 10*3/uL (ref 0.1–1.0)
Monocytes Relative: 12 %
Neutro Abs: 4.4 10*3/uL (ref 1.7–7.7)
Neutrophils Relative %: 60 %
Platelets: 365 10*3/uL (ref 150–400)
RBC: 3.84 MIL/uL — ABNORMAL LOW (ref 4.22–5.81)
RDW: 25.8 % — ABNORMAL HIGH (ref 11.5–15.5)
WBC: 7.3 10*3/uL (ref 4.0–10.5)
nRBC: 0 % (ref 0.0–0.2)

## 2020-04-05 LAB — COOXEMETRY PANEL
Carboxyhemoglobin: 1.1 % (ref 0.5–1.5)
Carboxyhemoglobin: 1.3 % (ref 0.5–1.5)
Methemoglobin: 1 % (ref 0.0–1.5)
Methemoglobin: 1.1 % (ref 0.0–1.5)
O2 Saturation: 47.6 %
O2 Saturation: 45.5 %
Total hemoglobin: 9.5 g/dL — ABNORMAL LOW (ref 12.0–16.0)
Total hemoglobin: 9.4 g/dL — ABNORMAL LOW (ref 12.0–16.0)

## 2020-04-05 LAB — GLUCOSE, CAPILLARY
Glucose-Capillary: 46 mg/dL — ABNORMAL LOW (ref 70–99)
Glucose-Capillary: 110 mg/dL — ABNORMAL HIGH (ref 70–99)
Glucose-Capillary: 55 mg/dL — ABNORMAL LOW (ref 70–99)
Glucose-Capillary: 70 mg/dL (ref 70–99)
Glucose-Capillary: 103 mg/dL — ABNORMAL HIGH (ref 70–99)
Glucose-Capillary: 170 mg/dL — ABNORMAL HIGH (ref 70–99)

## 2020-04-05 LAB — PHOSPHORUS: Phosphorus: 4.5 mg/dL (ref 2.5–4.6)

## 2020-04-05 LAB — MAGNESIUM: Magnesium: 2.3 mg/dL (ref 1.7–2.4)

## 2020-04-05 MED ORDER — AMIODARONE HCL IN DEXTROSE 360-4.14 MG/200ML-% IV SOLN
60.0000 mg/h | INTRAVENOUS | Status: AC
  Administered 2020-04-05: 60 mg/h via INTRAVENOUS
  Filled 2020-04-05 (×2): qty 200

## 2020-04-05 MED ORDER — INSULIN GLARGINE 100 UNIT/ML ~~LOC~~ SOLN
22.0000 [IU] | Freq: Two times a day (BID) | SUBCUTANEOUS | Status: DC
Start: 2020-04-05 — End: 2020-04-14
  Administered 2020-04-05 – 2020-04-14 (×19): 22 [IU] via SUBCUTANEOUS
  Filled 2020-04-05 (×20): qty 0.22

## 2020-04-05 MED ORDER — MILRINONE LACTATE IN DEXTROSE 20-5 MG/100ML-% IV SOLN
0.3750 ug/kg/min | INTRAVENOUS | Status: DC
  Administered 2020-04-05 – 2020-04-06 (×2): 0.125 ug/kg/min via INTRAVENOUS
  Administered 2020-04-06: 17:00:00 0.25 ug/kg/min via INTRAVENOUS
  Administered 2020-04-07 (×2): 0.375 ug/kg/min via INTRAVENOUS
  Administered 2020-04-07: 0.25 ug/kg/min via INTRAVENOUS
  Administered 2020-04-07 – 2020-04-10 (×11): 0.375 ug/kg/min via INTRAVENOUS
  Filled 2020-04-05 (×17): qty 100

## 2020-04-05 MED ORDER — AMIODARONE LOAD VIA INFUSION
150.0000 mg | Freq: Once | INTRAVENOUS | Status: AC
  Administered 2020-04-05: 150 mg via INTRAVENOUS
  Filled 2020-04-05: qty 83.34

## 2020-04-05 MED ORDER — FUROSEMIDE 10 MG/ML IJ SOLN
20.0000 mg/h | INTRAVENOUS | Status: DC
  Administered 2020-04-05 – 2020-04-10 (×12): 20 mg/h via INTRAVENOUS
  Filled 2020-04-05 (×16): qty 20

## 2020-04-05 MED ORDER — AMIODARONE HCL IN DEXTROSE 360-4.14 MG/200ML-% IV SOLN
30.0000 mg/h | INTRAVENOUS | Status: DC
  Administered 2020-04-05 – 2020-04-13 (×17): 30 mg/h via INTRAVENOUS
  Filled 2020-04-05 (×17): qty 200

## 2020-04-05 NOTE — Progress Notes (Signed)
PROGRESS NOTE    Raymond Pratt  [id number] DOB: 07-14-1950 DOA: 03/31/2020 PCP: Shon Baton, MD  Brief Narrative:  HPI per [name] on 04/02/2020 Raymond Pratt is a 70 y.o. male with medical history significant of combined systolic and diastolic heart failure, atrial fibrillation, anemia, cellulitis, GI bleed, hypertension, OSA on CPAP, diabetes, nonobstructive CAD who presents with worsening shortness of breath.             Patient states he has had worsening shortness of breath for the past day.  He also reports edema and states that he checked his weight and noted it has gone up by 30 to 50 pounds.  He states he has been compliant with diet and fluid intake.  He says his warfarin, digoxin, spironolactone were stopped during previous admissions. He states his symptoms are similar to his previous CHF exacerbations.  He typically uses 2 L supplemental O2 at home and had to increase it to 3 L last night and is on 3-4L in the ED.  He also reports per his home scale his weight has increased from 280 pounds to 330 pounds. He denies fevers, chills, chest pain, abdominal pain, constipation, diarrhea, nausea, vomiting.  ED Course: Vital signs significant for 4 L required to maintain saturations, heart rate in the one hundreds to one thirties, respirations in the teens to twenties.  Lab work-up showed BMP with potassium of 5.3, creatinine of 1.38 which is stable, glucose 230.  LFTs showed calcium of 8.6 which corrects when considering albumin of 3.1, ALT 50.  CBC showed hemoglobin of 9.2 which is stable.  PT/INR were normal.  BNP only mildly elevated at 195 the setting of obesity.  Troponin initially 20 with repeat pending.  Covid antigen negative Covid PCR screen pending.  Digoxin level low at less than 0.2.  Chest x-ray showed cardiomegaly, pulmonary vascular congestion and perihilar edema.  Patient started on Lasix cardiology was consulted and will see the patient in the  morning.  **Interim History  Cardiology was consulted and started diuresis on the patient and started anticoagulation.  They are placing a PICC line to monitor his volume status and coags.  Further care per cardiology and his renal function went up slightly.  Will need to continue monitor him extremely carefully given comorbidities.  Patient's coox was 47.6 this morning and remained in A. fib with heart rates in the 110s to 120s and he has not been diuresing very well and remained short of breath.  Cardiology has now changed him to a Lasix drip and have started the patient on milrinone.  They are holding his Entresto, digoxin and spironolactone for now.  Assessment & Plan:   Principal Problem:   Acute exacerbation of CHF (congestive heart failure) (HCC) Active Problems:   Type 2 diabetes mellitus with hyperlipidemia (HCC)   OSA on CPAP   Anticoagulated on Coumadin   HTN (hypertension)   A-fib (HCC)   Anemia   Atrial fibrillation with RVR (HCC)   Pressure injury of skin  Acute on chronic systolic CHF exacerbation > Last EF in 2020 was 20-30%, with hypokinesis. > Also A. fib with RVR which is likely worsening his exacerbation and hemodynamics. > States he is compliant with his medications but has had a gradual weight gain edema and shortness of breath and now has had a total of 50 pound weight gain has had multiple multiple liters diuresed during previous admissions. > States he has had digoxin and spironolactone discontinued during  recent admissions the spironolactone is possibly related to elevated potassium levels but unclear. - Cardiology consulted in ED and will see the patient in the morning, appreciate recommendations.  Audiology has evaluated him and patient continues to have marked volume overload in the setting of his recurrent atrial fibrillation.  They have increased his IV Lasix and added metolazone and also placed TED hose.  Cardiology is also starting Eliquis for anticoagulation  and recommending a TEE/DCCV once he is diuresed adequately.  They will place a PICC line and follow CVP and coox daily. - Lasix 60 mg IV twice daily was increased as below and he did not diurese well with this so cardiology has changed him to a Lasix drip and started milrinone.  His coox showed low output 48% and CVP is 25 with marked volume overload. -Echocardiogram being repeated showed an LVEF of 20 to 25% with global hypokinesis -Daily weights -Strict I/O he is -1.670 L since admission - Is on Delene Loll is currently being held by cardiology as he is getting diuresis.  And they also holding his digoxin and spironolactone -Cardiology increased his diuresis to 80 mg IV twice daily and also given him a dose of metolazone 5 mg p.o. once yesterday but he did not diurese well felt as above there is starting him on a Lasix drip  Atrial fibrillation with RVR > Presented with Elevated HR ranging from 115-140's > Dilt drip was avoided due to his low output heart failure but he did respond well to single dose of metoprolol -Digoxin Level was <0.2  -Had Uncontrolled rates and cardiology is to start him on milrinone and Ehrmann checked his thyroid function tests for anemia induced hyperthyroidism - He is now on amiodarone drip per cardiology recommendations - Continued home amiodarone at 200 mg p.o. twice daily but now since his heart rate remains uncontrolled cardiology starting him on amiodarone drip and checking thyroid function tests -He has been given IV metoprolol tartrate 5 mg x 2 -Cardiology started anticoagulation with apixaban and they are recommending continuing and then recommending a TEE/DCCV next week when his heart failure is improved  Acquired Thrombophilia -In the setting of his atrial fibrillation -Continue apixaban 5 mg p.o. twice daily  AKI on CKD stage IIIa -In the setting of his volume overload and diuresis  -Patient's BUNs/creatinine went from 35/1.3 is now 40/1.75 yesterday and  today it is 43/1.60 -Baseline creatinine ranged from 1.5-1.7 and he presented with a baseline creatinine better than his baseline -avoid further nephrotoxic medications, contrast dyes, hypotension and renally dose medications -His Delene Loll is currently being held -Repeat CMP in a.m. and continue to monitor renal function closely.  Diabetes -Home regimen is 60 units long-acting twice daily -Last hemoglobin A1c was 12.4 and his most recent hospitalization -We will give 30 units long-acting twice daily and SSI with resistant sliding scale AC -CBGs ranging from 70-103  Venous stasis wounds -Patient has stasis changes on lower extremities with wounds on his left lower extremity -No signs of infection at this time, wounds are managed by home health -We will consult wound care  Nonobstructive CAD -Continue home atorvastatin 40 mg and aspirin 81 mg -Cardiology following   OSA Chronic Respiratory Failure with Hypoxia -SpO2: 100 % O2 Flow Rate (L/min): 4 L/min -Continue home CPAP with Home Setting of [place] with 5 Liters Bleed in -Continue to Monitor Volume Status Carefully  Hyperkalemia -K 5.3 on admission -We will monitor for now as he will be getting Lasix for CHF exacerbation -  K+ now improved to 4.4 -Continue to Monitor and Trend -Delene Loll is currently being held due to hyperkalemia and renal failure -Repeat CMP in the AM  Normocytic Anemia -Patient's Hgb/Hct went from 9.2/33.6 -> 9.4/34.6 today it is 9.0/31.8 -Check Anemia Panel in the AM -Continue to Monitor for S/Sx of Bleeding; Currently no overt bleeding noted -Repeat CBC in the AM   Lung nodule  -Outpatient follow-up in 3 months given that he is 9 mm nodule found on CT scan  Pressure Injuries, poA Pressure Injury 04/04/20 Heel Left Stage 3 -  Full thickness tissue loss. Subcutaneous fat may be visible but bone, tendon or muscle are NOT exposed. (Active)  04/04/20   Location: Heel  Location Orientation: Left   Staging: Stage 3 -  Full thickness tissue loss. Subcutaneous fat may be visible but bone, tendon or muscle are NOT exposed.  Wound Description (Comments):   Present on Admission: Yes     Pressure Injury 04/04/20 Left Stage 3 -  Full thickness tissue loss. Subcutaneous fat may be visible but bone, tendon or muscle are NOT exposed. (Active)  04/04/20   Location:   Location Orientation: Left  Staging: Stage 3 -  Full thickness tissue loss. Subcutaneous fat may be visible but bone, tendon or muscle are NOT exposed.  Wound Description (Comments):   Present on Admission: Yes     Pressure Injury 04/04/20 Buttocks Right Stage 2 -  Partial thickness loss of dermis presenting as a shallow open injury with a red, pink wound bed without slough. (Active)  04/04/20   Location: Buttocks  Location Orientation: Right  Staging: Stage 2 -  Partial thickness loss of dermis presenting as a shallow open injury with a red, pink wound bed without slough.  Wound Description (Comments):   Present on Admission: Yes  -WOC Nurse Consulted   Super Morbid Obesity -Complicates overall prognosis and care -Estimated body mass index is 51.56 kg/m as calculated from the following:   Height as of this encounter: '5\' 7"'$  (1.702 m).   Weight as of this encounter: 149.3 kg. -Weight Loss and Dietary Counseling given   DVT prophylaxis: Anticoagulated with Eliquis Code Status: FULL CODE  Family Communication: No family present at bedside Disposition Plan: Pending further cardiac clearance and diuresis back to dry weight  Status is: Inpatient  Remains inpatient appropriate because:Unsafe d/c plan, IV treatments appropriate due to intensity of illness or inability to take PO and Inpatient level of care appropriate due to severity of illness   Dispo: The patient is from: Home              Anticipated d/c is to: TBD              Anticipated d/c date is: 3 days              Patient currently is not medically stable to  d/c.   Difficult to place patient No   Consultants:   Cardiology   Procedures:  ECHOCARDIOGRAM IMPRESSIONS    1. Limited study, patient asked the tech to discontinue the test before  it was completed  2. Atrial fibrillation with rapid ventricular response was noted  throughout the study  3. Left ventricular ejection fraction, by estimation, is 20 to 25%. The  left ventricle has severely decreased function. The left ventricle  demonstrates global hypokinesis. There is mild left ventricular  hypertrophy. Left ventricular diastolic function  could not be evaluated.  4. Right ventricular systolic function was not well visualized.  The right  ventricular size is not well visualized. There is moderately elevated  pulmonary artery systolic pressure.  5. Left atrial size was mildly dilated.  6. The mitral valve is abnormal. Trivial mitral valve regurgitation.  7. Tricuspid valve regurgitation is mild to moderate.  8. The aortic valve is tricuspid. Aortic valve regurgitation is not  visualized. Mild to moderate aortic valve sclerosis/calcification is  present, without any evidence of aortic stenosis.   Comparison(s): Changes from prior study are noted. 12/06/2018: LVEF 25-30%,  global hypokinesis.   FINDINGS  Left Ventricle: Left ventricular ejection fraction, by estimation, is 20  to 25%. The left ventricle has severely decreased function. The left  ventricle demonstrates global hypokinesis. The left ventricular internal  cavity size was normal in size. There  is mild left ventricular hypertrophy. Left ventricular diastolic function  could not be evaluated due to atrial fibrillation. Left ventricular  diastolic function could not be evaluated.   Right Ventricle: The right ventricular size is not well visualized. Right  vetricular wall thickness was not well visualized. Right ventricular  systolic function was not well visualized. There is moderately elevated  pulmonary  artery systolic pressure. The  tricuspid regurgitant velocity is 3.47 m/s, and with an assumed right  atrial pressure of 8 mmHg, the estimated right ventricular systolic  pressure is 99991111 mmHg.   Left Atrium: Left atrial size was mildly dilated.   Right Atrium: Right atrial size was normal in size.   Pericardium: There is no evidence of pericardial effusion.   Mitral Valve: The mitral valve is abnormal. There is mild thickening of  the mitral valve leaflet(s). Mild to moderate mitral annular  calcification. Trivial mitral valve regurgitation.   Tricuspid Valve: The tricuspid valve is grossly normal. Tricuspid valve  regurgitation is mild to moderate.   Aortic Valve: The aortic valve is tricuspid. Aortic valve regurgitation is  not visualized. Mild to moderate aortic valve sclerosis/calcification is  present, without any evidence of aortic stenosis.   Pulmonic Valve: The pulmonic valve was grossly normal. Pulmonic valve  regurgitation is trivial.   Aorta: The aortic root and ascending aorta are structurally normal, with  no evidence of dilitation.   IAS/Shunts: No atrial level shunt detected by color flow Doppler.     LEFT VENTRICLE  PLAX 2D  LVIDd:     5.40 cm  LVIDs:     3.20 cm  LV PW:     1.00 cm  LV IVS:    1.20 cm  LVOT diam:   1.90 cm  LV SV:     35  LV SV Index:  14  LVOT Area:   2.84 cm     LEFT ATRIUM      Index  LA diam:   5.70 cm 2.28 cm/m  LA Vol (A4C): 87.9 ml 35.18 ml/m  AORTIC VALVE  LVOT Vmax:  69.20 cm/s  LVOT Vmean: 52.550 cm/s  LVOT VTI:  0.124 m    AORTA  Ao Root diam: 3.10 cm  Ao Asc diam: 3.00 cm   TRICUSPID VALVE  TR Peak grad:  48.2 mmHg  TR Vmax:    347.00 cm/s    SHUNTS  Systemic VTI: 0.12 m  Systemic Diam: 1.90 cm   Antimicrobials:  Anti-infectives (From admission, onward)   None        Subjective: Seen and examined at bedside and he is sitting up in a chair eating  his food and still complains of shortness of breath and some bloating.  Feels a little bit better than he did yesterday but still not tremendously.  No nausea or vomiting.  Denies any lightheadedness or dizziness.  No other concerns or complaints at this time and did not diurese very well last night.  Cardiology started him on a Lasix and milrinone drip.  Objective: Vitals:   04/05/20 0640 04/05/20 1100 04/05/20 1107 04/05/20 1122  BP:  (!) 88/78 100/75   Pulse:  (!) 117    Resp:  18    Temp:    (!) 97.5 F (36.4 C)  TempSrc:      SpO2:  100%    Weight: (!) 149.3 kg     Height:        Intake/Output Summary (Last 24 hours) at 04/05/2020 1718 Last data filed at 04/05/2020 1634 Gross per 24 hour  Intake 1380 ml  Output 3050 ml  Net -1670 ml   Filed Weights   03/27/2020 1945 04/04/20 1700 04/05/20 0640  Weight: (!) 149.2 kg (!) 150 kg (!) 149.3 kg   Examination: Physical Exam:  Constitutional: WN/WD super morbidly obese Caucasian male currently in slight distress appears mild uncomfortable but sitting up eating food Eyes: Lids and conjunctivae normal, sclerae anicteric  ENMT: External Ears, Nose appear normal. Grossly normal hearing.  Neck: Appears normal, supple, no cervical masses, normal ROM, no appreciable thyromegaly; no JVD Respiratory: Diminished to auscultation bilaterally with coarse breath sounds, no wheezing, rales, rhonchi or crackles. Normal respiratory effort and patient is not tachypenic. No accessory muscle use.  Wearing supplemental oxygen via nasal cannula Cardiovascular: Irregularly irregular and tachycardic, no murmurs / rubs / gallops. S1 and S2 auscultated.  Has 1-2+ lower extremity edema Abdomen: Soft, non-tender, distended secondary body habitus. Bowel sounds positive.  GU: Deferred. Musculoskeletal: No clubbing / cyanosis of digits/nails. No joint deformity upper and lower extremities.  Skin: No rashes, lesions, as chronic venous stasis changes lower  extremities. No induration; Warm and dry.  Neurologic: CN 2-12 grossly intact with no focal deficits. Romberg sign and cerebellar reflexes not assessed.  Psychiatric: Normal judgment and insight. Alert and oriented x 3.  Not as anxious as yesterday and has a normal mood and appropriate affect.   Data Reviewed: I have personally reviewed following labs and imaging studies  CBC: Recent Labs  Lab 04/05/2020 2019 04/04/20 1449 04/05/20 0441  WBC 6.3 8.6 7.3  NEUTROABS 5.9  --  4.4  HGB 9.2* 9.4* 9.0*  HCT 33.6* 34.6* 31.8*  MCV 84.2 84.0 82.8  PLT 319 367 [id number]   Basic Metabolic Panel: Recent Labs  Lab 04/02/2020 2019 04/04/20 1449 04/05/20 0441  NA 139 139 138  K 5.3* 4.6 4.4  CL 104 103 102  CO2 '25 28 28  '$ GLUCOSE 238* 150* 69*  BUN 35* 40* 43*  CREATININE 1.38* 1.75* 1.60*  CALCIUM 8.6* 8.7* 8.5*  MG  --  2.3 2.3  PHOS  --  4.1 4.5   GFR: Estimated Creatinine Clearance: 62.1 mL/min (A) (by C-G formula based on SCr of 1.6 mg/dL (H)). Liver Function Tests: Recent Labs  Lab 04/06/2020 2019 04/04/20 1449 04/05/20 0441  AST 35 31 25  ALT 50* 48* 46*  ALKPHOS 92 81 82  BILITOT 0.6 0.7 0.5  PROT 6.6 6.7 6.4*  ALBUMIN 3.1* 3.1* 3.1*   No results for input(s): LIPASE, AMYLASE in the last 168 hours. No results for input(s): AMMONIA in the last 168 hours. Coagulation Profile: Recent Labs  Lab 03/09/2020 2019  INR 1.2   Cardiac  Enzymes: No results for input(s): CKTOTAL, CKMB, CKMBINDEX, TROPONINI in the last 168 hours. BNP (last 3 results) No results for input(s): PROBNP in the last 8760 hours. HbA1C: No results for input(s): HGBA1C in the last 72 hours. CBG: Recent Labs  Lab 04/05/20 0618 04/05/20 0647 04/05/20 0706 04/05/20 1120 04/05/20 1549  GLUCAP 46* 55* 110* 70 103*   Lipid Profile: No results for input(s): CHOL, HDL, LDLCALC, TRIG, CHOLHDL, LDLDIRECT in the last 72 hours. Thyroid Function Tests: No results for input(s): TSH, T4TOTAL, FREET4, T3FREE,  THYROIDAB in the last 72 hours. Anemia Panel: No results for input(s): VITAMINB12, FOLATE, FERRITIN, TIBC, IRON, RETICCTPCT in the last 72 hours. Sepsis Labs: No results for input(s): PROCALCITON, LATICACIDVEN in the last 168 hours.  Recent Results (from the past 240 hour(s))  SARS Coronavirus 2 by RT PCR (hospital order, performed in [facility] hospital lab) Nasopharyngeal Nasopharyngeal Swab     Status: None   Collection Time: 03/10/2020  9:51 PM   Specimen: Nasopharyngeal Swab  Result Value Ref Range Status   SARS Coronavirus 2 NEGATIVE NEGATIVE Final    Comment: Performed at [facility], [place] [address] [place], [place] [address]     RN Pressure Injury Documentation: Pressure Injury 04/04/20 Heel Left Stage 3 -  Full thickness tissue loss. Subcutaneous fat may be visible but bone, tendon or muscle are NOT exposed. (Active)  04/04/20   Location: Heel  Location Orientation: Left  Staging: Stage 3 -  Full thickness tissue loss. Subcutaneous fat may be visible but bone, tendon or muscle are NOT exposed.  Wound Description (Comments):   Present on Admission: Yes     Pressure Injury 04/04/20 Left Stage 3 -  Full thickness tissue loss. Subcutaneous fat may be visible but bone, tendon or muscle are NOT exposed. (Active)  04/04/20   Location:   Location Orientation: Left  Staging: Stage 3 -  Full thickness tissue loss. Subcutaneous fat may be visible but bone, tendon or muscle are NOT exposed.  Wound Description (Comments):   Present on Admission: Yes     Pressure Injury 04/04/20 Buttocks Right Stage 2 -  Partial thickness loss of dermis presenting as a shallow open injury with a red, pink wound bed without slough. (Active)  04/04/20   Location: Buttocks  Location Orientation: Right  Staging: Stage 2 -  Partial thickness loss of dermis presenting as a shallow open injury with a red, pink wound bed without slough.  Wound Description (Comments):   Present on Admission: Yes      Estimated body mass index is 51.56 kg/m as calculated from the following:   Height as of this encounter: '5\' 7"'$  (1.702 m).   Weight as of this encounter: 149.3 kg.  Malnutrition Type:   Malnutrition Characteristics:   Nutrition Interventions:    Radiology Studies: DG CHEST PORT 1 VIEW  Result Date: 04/05/2020 CLINICAL DATA:  Shortness of breath EXAM: PORTABLE CHEST 1 VIEW COMPARISON:  04/04/2020 FINDINGS: Cardiomegaly and vascular pedicle widening. Vascular congestion. No Kerley lines, effusion, or pneumothorax. New volume loss at the right base with hazy/streaky density. Remote rib fractures. IMPRESSION: 1. Cardiomegaly and vascular congestion. 2. New/increased atelectasis at the right base. Electronically Signed   By: Monte Fantasia M.D.   On: 04/05/2020 10:27   DG CHEST PORT 1 VIEW  Result Date: 04/04/2020 CLINICAL DATA:  PICC placement EXAM: PORTABLE CHEST 1 VIEW COMPARISON:  03/21/2020 FINDINGS: Left arm PICC tip. The tip is difficult to see but appears  to be in the SVC. Cardiac enlargement.  Mild vascular congestion.  Negative for edema. Patchy bibasilar airspace disease left greater than right unchanged. No significant effusion. IMPRESSION: PICC tip in the SVC Persistent bibasilar atelectasis or pneumonia. Electronically Signed   By: Franchot Gallo M.D.   On: 04/04/2020 20:39   DG Chest Portable 1 View  Result Date: 03/29/2020 CLINICAL DATA:  Shortness of breath since last night. Lower extremity edema and weight gain. History of congestive heart failure. EXAM: PORTABLE CHEST 1 VIEW COMPARISON:  02/13/2020 FINDINGS: Shallow inspiration. Cardiac enlargement with pulmonary vascular congestion and perihilar infiltrates, likely edema. No pleural effusions. No pneumothorax. Mediastinal contours appear intact. IMPRESSION: Cardiac enlargement with pulmonary vascular congestion and perihilar edema. Electronically Signed   By: Lucienne Capers M.D.   On: 03/21/2020 19:57   ECHOCARDIOGRAM  COMPLETE  Result Date: 04/04/2020    ECHOCARDIOGRAM REPORT   Patient Name:   Raymond Pratt [facility] Date of Exam: 04/04/2020 Medical Rec #:  [id number]               Height:       67.0 in Accession #:    [id number]              Weight:       329.0 lb Date of Birth:  Aug 08, 1950               BSA:          2.499 m Patient Age:    67 years                BP:           134/83 mmHg Patient Gender: M                       HR:           116 bpm. Exam Location:  Inpatient Procedure: 2D Echo, Cardiac Doppler and Color Doppler Indications:    CHF-Acute Systolic [id number]  History:        Patient has prior history of Echocardiogram examinations, most                 recent 12/06/2018. Cardiomyopathy and CHF, CAD, Arrythmias:Atrial                 Fibrillation; Risk Factors:Hypertension and Diabetes.  Sonographer:    Bernadene Person RDCS Referring Phys: [id number] Candace Gallus MELVIN  Sonographer Comments: Exam ended due to pt refusal during exam. IMPRESSIONS  1. Limited study, patient asked the tech to discontinue the test before it was completed  2. Atrial fibrillation with rapid ventricular response was noted throughout the study  3. Left ventricular ejection fraction, by estimation, is 20 to 25%. The left ventricle has severely decreased function. The left ventricle demonstrates global hypokinesis. There is mild left ventricular hypertrophy. Left ventricular diastolic function could not be evaluated.  4. Right ventricular systolic function was not well visualized. The right ventricular size is not well visualized. There is moderately elevated pulmonary artery systolic pressure.  5. Left atrial size was mildly dilated.  6. The mitral valve is abnormal. Trivial mitral valve regurgitation.  7. Tricuspid valve regurgitation is mild to moderate.  8. The aortic valve is tricuspid. Aortic valve regurgitation is not visualized. Mild to moderate aortic valve sclerosis/calcification is present, without any evidence of aortic stenosis.  Comparison(s): Changes from prior study are noted. 12/06/2018: LVEF 25-30%, global hypokinesis. FINDINGS  Left Ventricle: Left ventricular ejection  fraction, by estimation, is 20 to 25%. The left ventricle has severely decreased function. The left ventricle demonstrates global hypokinesis. The left ventricular internal cavity size was normal in size. There is mild left ventricular hypertrophy. Left ventricular diastolic function could not be evaluated due to atrial fibrillation. Left ventricular diastolic function could not be evaluated. Right Ventricle: The right ventricular size is not well visualized. Right vetricular wall thickness was not well visualized. Right ventricular systolic function was not well visualized. There is moderately elevated pulmonary artery systolic pressure. The  tricuspid regurgitant velocity is 3.47 m/s, and with an assumed right atrial pressure of 8 mmHg, the estimated right ventricular systolic pressure is 99991111 mmHg. Left Atrium: Left atrial size was mildly dilated. Right Atrium: Right atrial size was normal in size. Pericardium: There is no evidence of pericardial effusion. Mitral Valve: The mitral valve is abnormal. There is mild thickening of the mitral valve leaflet(s). Mild to moderate mitral annular calcification. Trivial mitral valve regurgitation. Tricuspid Valve: The tricuspid valve is grossly normal. Tricuspid valve regurgitation is mild to moderate. Aortic Valve: The aortic valve is tricuspid. Aortic valve regurgitation is not visualized. Mild to moderate aortic valve sclerosis/calcification is present, without any evidence of aortic stenosis. Pulmonic Valve: The pulmonic valve was grossly normal. Pulmonic valve regurgitation is trivial. Aorta: The aortic root and ascending aorta are structurally normal, with no evidence of dilitation. IAS/Shunts: No atrial level shunt detected by color flow Doppler.  LEFT VENTRICLE PLAX 2D LVIDd:         5.40 cm LVIDs:         3.20 cm LV PW:          1.00 cm LV IVS:        1.20 cm LVOT diam:     1.90 cm LV SV:         35 LV SV Index:   14 LVOT Area:     2.84 cm  LEFT ATRIUM           Index LA diam:      5.70 cm 2.28 cm/m LA Vol (A4C): 87.9 ml 35.18 ml/m  AORTIC VALVE LVOT Vmax:   69.20 cm/s LVOT Vmean:  52.550 cm/s LVOT VTI:    0.124 m  AORTA Ao Root diam: 3.10 cm Ao Asc diam:  3.00 cm TRICUSPID VALVE TR Peak grad:   48.2 mmHg TR Vmax:        347.00 cm/s  SHUNTS Systemic VTI:  0.12 m Systemic Diam: 1.90 cm Lyman Bishop MD Electronically signed by Lyman Bishop MD Signature Date/Time: 04/04/2020/1:39:04 PM    Final    [place] EKG SITE RITE  Result Date: 04/04/2020 If Site Rite image not attached, placement could not be confirmed due to current cardiac rhythm.  Scheduled Meds: . apixaban  5 mg Oral BID  . atorvastatin  40 mg Oral Daily  . Chlorhexidine Gluconate Cloth  6 each Topical Daily  . insulin aspart  0-20 Units Subcutaneous TID WC  . insulin glargine  22 Units Subcutaneous BID  . sodium chloride flush  3 mL Intravenous Q12H   Continuous Infusions: . amiodarone 30 mg/hr (04/05/20 1613)  . furosemide (LASIX) 200 mg in dextrose 5% 100 mL ('2mg'$ /mL) infusion 20 mg/hr (04/05/20 1223)  . milrinone 0.125 mcg/kg/min (04/05/20 1052)    LOS: 2 days   Kerney Elbe, DO [facility] PAGER is on [place]  If 7PM-7AM, please contact night-coverage [email or web address]

## 2020-04-05 NOTE — Progress Notes (Signed)
Amiodarone Drug - Drug Interaction Consult Note  Recommendations: Monitor for muscle weakness/pain when used with statins  Monitor potassium with diuresis. Goal >4  Amiodarone is metabolized by the cytochrome P450 system and therefore has the potential to cause many drug interactions. Amiodarone has an average plasma half-life of 50 days (range 20 to 100 days).   There is potential for drug interactions to occur several weeks or months after stopping treatment and the onset of drug interactions may be slow after initiating amiodarone.   '[x]'$  Statins: Increased risk of myopathy. Simvastatin- restrict dose to '20mg'$  daily. Other statins: counsel patients to report any muscle pain or weakness immediately.  '[]'$  Anticoagulants: Amiodarone can increase anticoagulant effect. Consider warfarin dose reduction. Patients should be monitored closely and the dose of anticoagulant altered accordingly, remembering that amiodarone levels take several weeks to stabilize.  '[]'$  Antiepileptics: Amiodarone can increase plasma concentration of phenytoin, the dose should be reduced. Note that small changes in phenytoin dose can result in large changes in levels. Monitor patient and counsel on signs of toxicity.  '[]'$  Beta blockers: increased risk of bradycardia, AV block and myocardial depression. Sotalol - avoid concomitant use.  '[]'$   Calcium channel blockers (diltiazem and verapamil): increased risk of bradycardia, AV block and myocardial depression.  '[]'$   Cyclosporine: Amiodarone increases levels of cyclosporine. Reduced dose of cyclosporine is recommended.  '[]'$  Digoxin dose should be halved when amiodarone is started.  '[x]'$  Diuretics: increased risk of cardiotoxicity if hypokalemia occurs.  '[]'$  Oral hypoglycemic agents (glyburide, glipizide, glimepiride): increased risk of hypoglycemia. Patient's glucose levels should be monitored closely when initiating amiodarone therapy.   '[]'$  Drugs that prolong the QT interval:   Torsades de pointes risk may be increased with concurrent use - avoid if possible.  Monitor QTc, also keep magnesium/potassium WNL if concurrent therapy can't be avoided. Marland Kitchen Antibiotics: e.g. fluoroquinolones, erythromycin. . Antiarrhythmics: e.g. quinidine, procainamide, disopyramide, sotalol. . Antipsychotics: e.g. phenothiazines, haloperidol.  . Lithium, tricyclic antidepressants, and methadone.  Thank You,  Norina Buzzard, PharmD PGY1 Pharmacy Resident 04/05/2020 9:19 AM

## 2020-04-05 NOTE — Progress Notes (Signed)
CVP was initiated, at 22 mmhg.

## 2020-04-05 NOTE — Progress Notes (Signed)
Hypoglycemic Event  CBG: 46  Treatment: 2 OJs  Symptoms: asymptomatic asymtomatic  Follow-up CBG: Time:0706 CBG Result:110  Possible Reasons for Event: unknown  Comments/MD notifiedn/a    Tanya Nones D Glennda Weatherholtz

## 2020-04-05 NOTE — Progress Notes (Signed)
Advanced Heart Failure Rounding Note   Subjective:    Remains SOB and bloated. Not diuresing well with lasix.   PICC placed. CVP 25. Co-ox 48%  Remains in AF rates up now 110-120   Objective:   Weight Range:  Vital Signs:   Temp:  [98.3 F (36.8 C)-98.5 F (36.9 C)] 98.3 F (36.8 C) (01/28 2345) Pulse Rate:  [73-124] 124 (01/28 2345) Resp:  [14-25] 14 (01/28 2345) BP: (103-153)/(66-101) 107/76 (01/28 2345) SpO2:  [97 %-100 %] 100 % (01/28 2345) Weight:  [149.3 kg-150 kg] 149.3 kg (01/29 0640) Last BM Date: 04/04/20  Weight change: Filed Weights   04/06/2020 1945 04/04/20 1700 04/05/20 0640  Weight: (!) 149.2 kg (!) 150 kg (!) 149.3 kg    Intake/Output:   Intake/Output Summary (Last 24 hours) at 04/05/2020 0857 Last data filed at 04/04/2020 2300 Gross per 24 hour  Intake 660 ml  Output 700 ml  Net -40 ml     Physical Exam: General:  Obese male sitting up  HEENT: normal Neck: supple. JVP to ear Carotids 2+ bilat; no bruits. No lymphadenopathy or thryomegaly appreciated. Cor: PMI nondisplaced. Irregular tachy Lungs: + crackles Abdomen: Obese  soft, nontender, + distended. No hepatosplenomegaly. No bruits or masses. Good bowel sounds. Extremities: no cyanosis, clubbing, rash, 4+ edema into thighs + heel wound Neuro: alert & orientedx3, cranial nerves grossly intact. moves all 4 extremities w/o difficulty. Affect pleasant  Telemetry: AF 110-120s Personally reviewed  Labs: Basic Metabolic Panel: Recent Labs  Lab 03/11/2020 2019 04/04/20 1449 04/05/20 0441  NA 139 139 138  K 5.3* 4.6 4.4  CL 104 103 102  CO2 '25 28 28  '$ GLUCOSE 238* 150* 69*  BUN 35* 40* 43*  CREATININE 1.38* 1.75* 1.60*  CALCIUM 8.6* 8.7* 8.5*  MG  --  2.3 2.3  PHOS  --  4.1 4.5    Liver Function Tests: Recent Labs  Lab 03/08/2020 2019 04/04/20 1449 04/05/20 0441  AST 35 31 25  ALT 50* 48* 46*  ALKPHOS 92 81 82  BILITOT 0.6 0.7 0.5  PROT 6.6 6.7 6.4*  ALBUMIN 3.1* 3.1* 3.1*    No results for input(s): LIPASE, AMYLASE in the last 168 hours. No results for input(s): AMMONIA in the last 168 hours.  CBC: Recent Labs  Lab 03/20/2020 2019 04/04/20 1449 04/05/20 0441  WBC 6.3 8.6 7.3  NEUTROABS 5.9  --  4.4  HGB 9.2* 9.4* 9.0*  HCT 33.6* 34.6* 31.8*  MCV 84.2 84.0 82.8  PLT 319 367 365    Cardiac Enzymes: No results for input(s): CKTOTAL, CKMB, CKMBINDEX, TROPONINI in the last 168 hours.  BNP: BNP (last 3 results) Recent Labs    12/10/19 0057 02/13/20 0807 03/14/2020 2019  BNP 154.7* 111.6* 195.0*    ProBNP (last 3 results) No results for input(s): PROBNP in the last 8760 hours.    Other results:  Imaging: DG CHEST PORT 1 VIEW  Result Date: 04/04/2020 CLINICAL DATA:  PICC placement EXAM: PORTABLE CHEST 1 VIEW COMPARISON:  03/10/2020 FINDINGS: Left arm PICC tip. The tip is difficult to see but appears to be in the SVC. Cardiac enlargement.  Mild vascular congestion.  Negative for edema. Patchy bibasilar airspace disease left greater than right unchanged. No significant effusion. IMPRESSION: PICC tip in the SVC Persistent bibasilar atelectasis or pneumonia. Electronically Signed   By: Franchot Gallo M.D.   On: 04/04/2020 20:39   DG Chest Portable 1 View  Result Date: 03/12/2020 CLINICAL DATA:  Shortness of breath since last night. Lower extremity edema and weight gain. History of congestive heart failure. EXAM: PORTABLE CHEST 1 VIEW COMPARISON:  02/13/2020 FINDINGS: Shallow inspiration. Cardiac enlargement with pulmonary vascular congestion and perihilar infiltrates, likely edema. No pleural effusions. No pneumothorax. Mediastinal contours appear intact. IMPRESSION: Cardiac enlargement with pulmonary vascular congestion and perihilar edema. Electronically Signed   By: Lucienne Capers M.D.   On: 03/16/2020 19:57   ECHOCARDIOGRAM COMPLETE  Result Date: 04/04/2020    ECHOCARDIOGRAM REPORT   Patient Name:   [facility]att [facility] Date of Exam:  04/04/2020 Medical Rec #:  [id number]               Height:       67.0 in Accession #:    [id number]              Weight:       329.0 lb Date of Birth:  10-23-1950               BSA:          2.499 m Patient Age:    70 years                BP:           134/83 mmHg Patient Gender: M                       HR:           116 bpm. Exam Location:  Inpatient Procedure: 2D Echo, Cardiac Doppler and Color Doppler Indications:    CHF-Acute Systolic [id number]  History:        Patient has prior history of Echocardiogram examinations, most                 recent 12/06/2018. Cardiomyopathy and CHF, CAD, Arrythmias:Atrial                 Fibrillation; Risk Factors:Hypertension and Diabetes.  Sonographer:    Bernadene Person RDCS Referring Phys: [id number] Candace Gallus MELVIN  Sonographer Comments: Exam ended due to pt refusal during exam. IMPRESSIONS  1. Limited study, patient asked the tech to discontinue the test before it was completed  2. Atrial fibrillation with rapid ventricular response was noted throughout the study  3. Left ventricular ejection fraction, by estimation, is 20 to 25%. The left ventricle has severely decreased function. The left ventricle demonstrates global hypokinesis. There is mild left ventricular hypertrophy. Left ventricular diastolic function could not be evaluated.  4. Right ventricular systolic function was not well visualized. The right ventricular size is not well visualized. There is moderately elevated pulmonary artery systolic pressure.  5. Left atrial size was mildly dilated.  6. The mitral valve is abnormal. Trivial mitral valve regurgitation.  7. Tricuspid valve regurgitation is mild to moderate.  8. The aortic valve is tricuspid. Aortic valve regurgitation is not visualized. Mild to moderate aortic valve sclerosis/calcification is present, without any evidence of aortic stenosis. Comparison(s): Changes from prior study are noted. 12/06/2018: LVEF 25-30%, global hypokinesis. FINDINGS  Left Ventricle:  Left ventricular ejection fraction, by estimation, is 20 to 25%. The left ventricle has severely decreased function. The left ventricle demonstrates global hypokinesis. The left ventricular internal cavity size was normal in size. There is mild left ventricular hypertrophy. Left ventricular diastolic function could not be evaluated due to atrial fibrillation. Left ventricular diastolic function could not be evaluated. Right Ventricle: The right ventricular size is not well visualized.  Right vetricular wall thickness was not well visualized. Right ventricular systolic function was not well visualized. There is moderately elevated pulmonary artery systolic pressure. The  tricuspid regurgitant velocity is 3.47 m/s, and with an assumed right atrial pressure of 8 mmHg, the estimated right ventricular systolic pressure is [id number] mmHg. Left Atrium: Left atrial size was mildly dilated. Right Atrium: Right atrial size was normal in size. Pericardium: There is no evidence of pericardial effusion. Mitral Valve: The mitral valve is abnormal. There is mild thickening of the mitral valve leaflet(s). Mild to moderate mitral annular calcification. Trivial mitral valve regurgitation. Tricuspid Valve: The tricuspid valve is grossly normal. Tricuspid valve regurgitation is mild to moderate. Aortic Valve: The aortic valve is tricuspid. Aortic valve regurgitation is not visualized. Mild to moderate aortic valve sclerosis/calcification is present, without any evidence of aortic stenosis. Pulmonic Valve: The pulmonic valve was grossly normal. Pulmonic valve regurgitation is trivial. Aorta: The aortic root and ascending aorta are structurally normal, with no evidence of dilitation. IAS/Shunts: No atrial level shunt detected by color flow Doppler.  LEFT VENTRICLE PLAX 2D LVIDd:         5.40 cm LVIDs:         3.20 cm LV PW:         1.00 cm LV IVS:        1.20 cm LVOT diam:     1.90 cm LV SV:         35 LV SV Index:   14 LVOT Area:     2.84 cm   LEFT ATRIUM           Index LA diam:      5.70 cm 2.28 cm/m LA Vol (A4C): 87.9 ml 35.18 ml/m  AORTIC VALVE LVOT Vmax:   69.20 cm/s LVOT Vmean:  52.550 cm/s LVOT VTI:    0.124 m  AORTA Ao Root diam: 3.10 cm Ao Asc diam:  3.00 cm TRICUSPID VALVE TR Peak grad:   48.2 mmHg TR Vmax:        347.00 cm/s  SHUNTS Systemic VTI:  0.12 m Systemic Diam: 1.90 cm Lyman Bishop MD Electronically signed by Lyman Bishop MD Signature Date/Time: 04/04/2020/1:39:04 PM    Final    [place] EKG SITE RITE  Result Date: 04/04/2020 If Site Rite image not attached, placement could not be confirmed due to current cardiac rhythm.     Medications:     Scheduled Medications: . amiodarone  200 mg Oral BID  . apixaban  5 mg Oral BID  . aspirin EC  81 mg Oral Daily  . atorvastatin  40 mg Oral Daily  . Chlorhexidine Gluconate Cloth  6 each Topical Daily  . insulin aspart  0-20 Units Subcutaneous TID WC  . insulin glargine  22 Units Subcutaneous BID  . sodium chloride flush  3 mL Intravenous Q12H     Infusions: . furosemide (LASIX) 200 mg in dextrose 5% 100 mL ('2mg'$ /mL) infusion    . milrinone       PRN Medications:  acetaminophen **OR** acetaminophen, [COMPLETED] diphenhydrAMINE **FOLLOWED BY** diphenhydrAMINE, ondansetron (ZOFRAN) IV, polyethylene glycol, sodium chloride flush   Assessment/Plan:   1.  Acute on chronic systolic heart failure - Echo (11/2018) EF 25-30%, trace MR, trivial TR, moderately reduced RSVF.   - Cath (12/2018) RA 10, RV 55/8, PA 54/15 (27), PCW = 16, Fick CO/CI = 7.0/2.8 - Echo (04/04/2020) EF 45-50%, mild LVH, hypokinesis LV, dilated LA, dilated RV/RA, mod MR, mod-severe TR, RVSP > 60  mmHg.  - Co-ox c/w low output 48% CVP 25 with marked volume overload - Likely triggered by AF  - Start milrinone. Switch to lasix gtt  - Holding Entresto, dig and spiro for now - hopefully can restart soon   2. Persistent AF - has been on amio at home - suspect has been in AF for several week. ECG in  12/21 was NSR - rate now elevated and starting milrinone so will start IV - given long term use of amio will check TFTs to assess for amio-induced hyperthyroidism - continue Eliquis - TEE DC-CV next week when HF improved  3. Chronic respiratory failure/OSA - Continue with CPAP at night for OSA   4. CKD 3b - Baseline Cr 1.5-1.7 - stable 1.6 today - start Farxiga prior to d/c  5. Anemia/Hx of GIB - Hgb stable at 9.0 - EGD 12/2019 nonbleeding duodenal ulcer with gastritis - Continue to monitor  6. Venous stasis wounds L tib/fib, L heel & Sacral wound - Wound care consulted. Primary team managing   7. T2DM - A1c 12.4% recent hospitalization - Per primary team - Add SGLT2i prior to d/c  8. Morbidly obese - BMI 51.5  9. Lung nodule - Will need outpatient imaging in 3 months, 9 mm CT (03/2020)    Length of Stay: 2   [name] MD 04/05/2020, 8:57 AM  Advanced Heart Failure Team Pager [phone] (M-F; 7a - 4p)  Please contact [facility] for night-coverage after hours (4p -7a ) and weekends on [email or web address]

## 2020-04-06 ENCOUNTER — Inpatient Hospital Stay (HOSPITAL_COMMUNITY): Payer: Medicare HMO

## 2020-04-06 DIAGNOSIS — I509 Heart failure, unspecified: Secondary | ICD-10-CM | POA: Diagnosis not present

## 2020-04-06 DIAGNOSIS — E1169 Type 2 diabetes mellitus with other specified complication: Secondary | ICD-10-CM | POA: Diagnosis not present

## 2020-04-06 DIAGNOSIS — E785 Hyperlipidemia, unspecified: Secondary | ICD-10-CM | POA: Diagnosis not present

## 2020-04-06 DIAGNOSIS — I517 Cardiomegaly: Secondary | ICD-10-CM | POA: Diagnosis not present

## 2020-04-06 DIAGNOSIS — I4891 Unspecified atrial fibrillation: Secondary | ICD-10-CM | POA: Diagnosis not present

## 2020-04-06 DIAGNOSIS — G4733 Obstructive sleep apnea (adult) (pediatric): Secondary | ICD-10-CM | POA: Diagnosis not present

## 2020-04-06 DIAGNOSIS — I5023 Acute on chronic systolic (congestive) heart failure: Secondary | ICD-10-CM | POA: Diagnosis not present

## 2020-04-06 DIAGNOSIS — I480 Paroxysmal atrial fibrillation: Secondary | ICD-10-CM

## 2020-04-06 DIAGNOSIS — Z9989 Dependence on other enabling machines and devices: Secondary | ICD-10-CM

## 2020-04-06 DIAGNOSIS — I10 Essential (primary) hypertension: Secondary | ICD-10-CM | POA: Diagnosis not present

## 2020-04-06 DIAGNOSIS — I4819 Other persistent atrial fibrillation: Secondary | ICD-10-CM | POA: Diagnosis not present

## 2020-04-06 LAB — COOXEMETRY PANEL
Carboxyhemoglobin: 1.3 % (ref 0.5–1.5)
Carboxyhemoglobin: 1.4 % (ref 0.5–1.5)
Methemoglobin: 0.9 % (ref 0.0–1.5)
Methemoglobin: 1 % (ref 0.0–1.5)
O2 Saturation: 45.8 %
O2 Saturation: 47.7 %
Total hemoglobin: 9.4 g/dL — ABNORMAL LOW (ref 12.0–16.0)
Total hemoglobin: 9.7 g/dL — ABNORMAL LOW (ref 12.0–16.0)

## 2020-04-06 LAB — CBC WITH DIFFERENTIAL/PLATELET
Abs Immature Granulocytes: 0.03 10*3/uL (ref 0.00–0.07)
Basophils Absolute: 0.1 10*3/uL (ref 0.0–0.1)
Basophils Relative: 1 %
Eosinophils Absolute: 0.3 10*3/uL (ref 0.0–0.5)
Eosinophils Relative: 4 %
HCT: 33.4 % — ABNORMAL LOW (ref 39.0–52.0)
Hemoglobin: 9.3 g/dL — ABNORMAL LOW (ref 13.0–17.0)
Immature Granulocytes: 0 %
Lymphocytes Relative: 19 %
Lymphs Abs: 1.4 10*3/uL (ref 0.7–4.0)
MCH: 22.9 pg — ABNORMAL LOW (ref 26.0–34.0)
MCHC: 27.8 g/dL — ABNORMAL LOW (ref 30.0–36.0)
MCV: 82.1 fL (ref 80.0–100.0)
Monocytes Absolute: 0.9 10*3/uL (ref 0.1–1.0)
Monocytes Relative: 12 %
Neutro Abs: 4.6 10*3/uL (ref 1.7–7.7)
Neutrophils Relative %: 64 %
Platelets: 398 10*3/uL (ref 150–400)
RBC: 4.07 MIL/uL — ABNORMAL LOW (ref 4.22–5.81)
RDW: 25.6 % — ABNORMAL HIGH (ref 11.5–15.5)
WBC: 7.2 10*3/uL (ref 4.0–10.5)
nRBC: 0 % (ref 0.0–0.2)

## 2020-04-06 LAB — COMPREHENSIVE METABOLIC PANEL
ALT: 44 U/L (ref 0–44)
AST: 25 U/L (ref 15–41)
Albumin: 3.3 g/dL — ABNORMAL LOW (ref 3.5–5.0)
Alkaline Phosphatase: 82 U/L (ref 38–126)
Anion gap: 12 (ref 5–15)
BUN: 43 mg/dL — ABNORMAL HIGH (ref 8–23)
CO2: 30 mmol/L (ref 22–32)
Calcium: 9 mg/dL (ref 8.9–10.3)
Chloride: 95 mmol/L — ABNORMAL LOW (ref 98–111)
Creatinine, Ser: 1.51 mg/dL — ABNORMAL HIGH (ref 0.61–1.24)
GFR, Estimated: 50 mL/min — ABNORMAL LOW (ref >60)
Glucose, Bld: 96 mg/dL (ref 70–99)
Potassium: 3.7 mmol/L (ref 3.5–5.1)
Sodium: 137 mmol/L (ref 135–145)
Total Bilirubin: 1 mg/dL (ref 0.3–1.2)
Total Protein: 6.8 g/dL (ref 6.5–8.1)

## 2020-04-06 LAB — TSH: TSH: 6.902 u[IU]/mL — ABNORMAL HIGH (ref 0.350–4.500)

## 2020-04-06 LAB — GLUCOSE, CAPILLARY
Glucose-Capillary: 96 mg/dL (ref 70–99)
Glucose-Capillary: 143 mg/dL — ABNORMAL HIGH (ref 70–99)
Glucose-Capillary: 166 mg/dL — ABNORMAL HIGH (ref 70–99)
Glucose-Capillary: 221 mg/dL — ABNORMAL HIGH (ref 70–99)

## 2020-04-06 LAB — MAGNESIUM: Magnesium: 2.1 mg/dL (ref 1.7–2.4)

## 2020-04-06 LAB — PHOSPHORUS: Phosphorus: 4 mg/dL (ref 2.5–4.6)

## 2020-04-06 LAB — T4, FREE: Free T4: 0.98 ng/dL (ref 0.61–1.12)

## 2020-04-06 MED ORDER — SPIRONOLACTONE 12.5 MG HALF TABLET
12.5000 mg | ORAL_TABLET | Freq: Every day | ORAL | Status: DC
  Administered 2020-04-06 – 2020-04-16 (×11): 12.5 mg via ORAL
  Filled 2020-04-06 (×11): qty 1

## 2020-04-06 MED ORDER — HYDROCORTISONE 1 % EX CREA
TOPICAL_CREAM | Freq: Four times a day (QID) | CUTANEOUS | Status: DC | PRN
  Administered 2020-04-06: 1 via TOPICAL
  Filled 2020-04-06 (×2): qty 28

## 2020-04-06 MED ORDER — HYDROXYZINE HCL 25 MG PO TABS
50.0000 mg | ORAL_TABLET | Freq: Three times a day (TID) | ORAL | Status: DC | PRN
  Administered 2020-04-06 – 2020-04-19 (×18): 50 mg via ORAL
  Filled 2020-04-06 (×20): qty 2

## 2020-04-06 MED ORDER — POTASSIUM CHLORIDE CRYS ER 20 MEQ PO TBCR
20.0000 meq | EXTENDED_RELEASE_TABLET | Freq: Two times a day (BID) | ORAL | Status: DC
  Administered 2020-04-06 – 2020-04-07 (×3): 20 meq via ORAL
  Filled 2020-04-06: qty 1
  Filled 2020-04-06 (×5): qty 2

## 2020-04-06 MED ORDER — POTASSIUM CHLORIDE CRYS ER 20 MEQ PO TBCR
40.0000 meq | EXTENDED_RELEASE_TABLET | Freq: Once | ORAL | Status: AC
  Administered 2020-04-06: 40 meq via ORAL
  Filled 2020-04-06: qty 2

## 2020-04-06 NOTE — Progress Notes (Addendum)
PROGRESS NOTE    Raymond Pratt  [id number] DOB: 07/11/1950 DOA: 03/16/2020 PCP: Shon Baton, MD  Brief Narrative:  HPI per [name] on 03/15/2020 Raymond Pratt is a 70 y.o. male with medical history significant of combined systolic and diastolic heart failure, atrial fibrillation, anemia, cellulitis, GI bleed, hypertension, OSA on CPAP, diabetes, nonobstructive CAD who presents with worsening shortness of breath.             Patient states he has had worsening shortness of breath for the past day.  He also reports edema and states that he checked his weight and noted it has gone up by 30 to 50 pounds.  He states he has been compliant with diet and fluid intake.  He says his warfarin, digoxin, spironolactone were stopped during previous admissions. He states his symptoms are similar to his previous CHF exacerbations.  He typically uses 2 L supplemental O2 at home and had to increase it to 3 L last night and is on 3-4L in the ED.  He also reports per his home scale his weight has increased from 280 pounds to 330 pounds. He denies fevers, chills, chest pain, abdominal pain, constipation, diarrhea, nausea, vomiting.  ED Course: Vital signs significant for 4 L required to maintain saturations, heart rate in the one hundreds to one thirties, respirations in the teens to twenties.  Lab work-up showed BMP with potassium of 5.3, creatinine of 1.38 which is stable, glucose 230.  LFTs showed calcium of 8.6 which corrects when considering albumin of 3.1, ALT 50.  CBC showed hemoglobin of 9.2 which is stable.  PT/INR were normal.  BNP only mildly elevated at 195 the setting of obesity.  Troponin initially 20 with repeat pending.  Covid antigen negative Covid PCR screen pending.  Digoxin level low at less than 0.2.  Chest x-ray showed cardiomegaly, pulmonary vascular congestion and perihilar edema.  Patient started on Lasix cardiology was consulted and will see the patient in the  morning.  **Interim History  Cardiology was consulted and started diuresis on the patient and started anticoagulation.  They are placing a PICC line to monitor his volume status and coags.  Further care per cardiology and his renal function went up slightly.  Will need to continue monitor him extremely carefully given comorbidities.  Patient's coox was 47.6 this morning and remained in A. fib with heart rates in the 110s to 120s and he has not been diuresing very well and remained short of breath.  Cardiology has now changed him to a Lasix drip and have started the patient on milrinone.  They are holding his Entresto, digoxin and spironolactone for now.  Weight is down a little bit but continues to Diurese and down approximately 6 Liters. Cr is improving slightly.   Assessment & Plan:   Principal Problem:   Acute exacerbation of CHF (congestive heart failure) (HCC) Active Problems:   Type 2 diabetes mellitus with hyperlipidemia (HCC)   OSA on CPAP   Anticoagulated on Coumadin   HTN (hypertension)   A-fib (HCC)   Anemia   Atrial fibrillation with RVR (HCC)   Pressure injury of skin  Acute on chronic systolic CHF exacerbation > Last EF in 2020 was 20-30%, with hypokinesis. > Also A. fib with RVR which is likely worsening his exacerbation and hemodynamics. > States he is compliant with his medications but has had a gradual weight gain edema and shortness of breath and now has had a total of 50 pound  weight gain has had multiple multiple liters diuresed during previous admissions. > States he has had digoxin and spironolactone discontinued during recent admissions the spironolactone is possibly related to elevated potassium levels but unclear. - Cardiology consulted in ED and will see the patient in the morning, appreciate recommendations.  Audiology has evaluated him and patient continues to have marked volume overload in the setting of his recurrent atrial fibrillation.  They have increased  his IV Lasix and added metolazone and also placed TED hose.  Cardiology is also starting Eliquis for anticoagulation and recommending a TEE/DCCV once he is diuresed adequately.  They will place a PICC line and follow CVP and coox daily. - Now on a Lasix gtt and Milrinone started.  His coox showed low output 47.7% on Repeat today   -Echocardiogram being repeated showed an LVEF of 20 to 25% with global hypokinesis -Daily weights -Strict I/O he is -6.170 L since admission; Weight is down from 329 -> 321 - Is on Entresto is currently being held by cardiology as he is getting diuresis and holding his Digoxin; Now they will be restarting his Spironolactone to 12.5 mg po BID -CXR this AM showed "Cardiomegaly and low lung volumes." -C/w Lasix gtt and Further Care per Cardiology   Atrial fibrillation with RVR > Presented with Elevated HR ranging from 115-140's > Dilt drip was avoided due to his low output heart failure but he did respond well to single dose of metoprolol -Digoxin Level was <0.2  -Had Uncontrolled rates and cardiology is to start him on milrinone and Ehrmann checked his thyroid function tests for anemia induced hyperthyroidism - He is now on amiodarone drip per cardiology recommendations - Continued home amiodarone at 200 mg p.o. twice daily but now since his heart rate remains uncontrolled cardiology starting him on amiodarone drip and checking thyroid function tests; TFTS done and showed Elevated TSH at 6.902 but normal T4 of 0.98 -He has been given IV metoprolol tartrate 5 mg x 2 -Cardiology started anticoagulation with apixaban and they are recommending continuing and then recommending a TEE/DCCV this coming week when his heart failure is improved -Cardiology recommending continuing IV Amio while on a Milrinone gtt  Acquired Thrombophilia -In the setting of his atrial fibrillation -Continue apixaban 5 mg p.o. twice daily  AKI on CKD stage IIIa -In the setting of his volume  overload and diuresis  -Patient's BUNs/creatinine went from 35/1.3 is now 40/1.75 -> 43/1.60 -> 43/1.51 -Baseline creatinine ranged from 1.5-1.7 and he presented with a baseline creatinine better than his baseline -avoid further nephrotoxic medications, contrast dyes, hypotension and renally dose medications -His Delene Loll is currently being held but his Spironolactone is being added  -Repeat CMP in a.m. and continue to monitor renal function closely.  Diabetes Mellitus Type 2 -Home regimen is 60 units long-acting twice daily -Last hemoglobin A1c was 12.4 and his most recent hospitalization -We will give 30 units long-acting twice daily and SSI with resistant sliding scale AC -CBGs ranging from 96-170  Venous stasis wounds -Patient has stasis changes on lower extremities with wounds on his left lower extremity -No signs of infection at this time, wounds are managed by home health -We will consult wound care  Nonobstructive CAD -Continue home atorvastatin 40 mg and aspirin 81 mg -Cardiology following   OSA Chronic Respiratory Failure with Hypoxia -SpO2: 96 % O2 Flow Rate (L/min): 5 L/min -Continue home CPAP with Home Setting of [place] with 5 Liters Bleed in -Continue to Monitor Volume Status Carefully  Hyperkalemia -K 5.3 on admission -We will monitor for now as he will be getting Lasix for CHF exacerbation -K+ now improved to 3.7 -Continue to Monitor and Trend -Delene Loll is currently being held due to hyperkalemia and renal failure -Repeat CMP in the AM  Normocytic Anemia -Patient's Hgb/Hct went from 9.2/33.6 -> 9.4/34.6 -> 9.0/31.8 -> 9.3/33.4 -Check Anemia Panel in the AM -Continue to Monitor for S/Sx of Bleeding; Currently no overt bleeding noted -Repeat CBC in the AM   Pruritis -C/w Benadryl 25 mg po q6hprn -C/w Hydrocortisone Cream -Will add Hydroxyzine 25 mg po TID  Lung nodule  -Outpatient follow-up in 3 months given that he is 9 mm nodule found on CT  scan  Pressure Injuries, poA Pressure Injury 04/04/20 Heel Left Stage 3 -  Full thickness tissue loss. Subcutaneous fat may be visible but bone, tendon or muscle are NOT exposed. (Active)  04/04/20   Location: Heel  Location Orientation: Left  Staging: Stage 3 -  Full thickness tissue loss. Subcutaneous fat may be visible but bone, tendon or muscle are NOT exposed.  Wound Description (Comments):   Present on Admission: Yes     Pressure Injury 04/04/20 Left Stage 3 -  Full thickness tissue loss. Subcutaneous fat may be visible but bone, tendon or muscle are NOT exposed. (Active)  04/04/20   Location:   Location Orientation: Left  Staging: Stage 3 -  Full thickness tissue loss. Subcutaneous fat may be visible but bone, tendon or muscle are NOT exposed.  Wound Description (Comments):   Present on Admission: Yes     Pressure Injury 04/04/20 Buttocks Right Stage 2 -  Partial thickness loss of dermis presenting as a shallow open injury with a red, pink wound bed without slough. (Active)  04/04/20   Location: Buttocks  Location Orientation: Right  Staging: Stage 2 -  Partial thickness loss of dermis presenting as a shallow open injury with a red, pink wound bed without slough.  Wound Description (Comments):   Present on Admission: Yes  -WOC Nurse Consulted   Super Morbid Obesity -Complicates overall prognosis and care -Estimated body mass index is 50.29 kg/m as calculated from the following:   Height as of this encounter: '5\' 7"'$  (1.702 m).   Weight as of this encounter: 145.7 kg. -Weight Loss and Dietary Counseling given   DVT prophylaxis: Anticoagulated with Eliquis Code Status: FULL CODE  Family Communication: No family present at bedside Disposition Plan: Pending further cardiac clearance and diuresis back to dry weight  Status is: Inpatient  Remains inpatient appropriate because:Unsafe d/c plan, IV treatments appropriate due to intensity of illness or inability to take PO and  Inpatient level of care appropriate due to severity of illness   Dispo: The patient is from: Home              Anticipated d/c is to: TBD              Anticipated d/c date is: 2 days              Patient currently is not medically stable to d/c.   Difficult to place patient No   Consultants:   Cardiology   Procedures:  ECHOCARDIOGRAM IMPRESSIONS    1. Limited study, patient asked the tech to discontinue the test before  it was completed  2. Atrial fibrillation with rapid ventricular response was noted  throughout the study  3. Left ventricular ejection fraction, by estimation, is 20 to 25%. The  left ventricle  has severely decreased function. The left ventricle  demonstrates global hypokinesis. There is mild left ventricular  hypertrophy. Left ventricular diastolic function  could not be evaluated.  4. Right ventricular systolic function was not well visualized. The right  ventricular size is not well visualized. There is moderately elevated  pulmonary artery systolic pressure.  5. Left atrial size was mildly dilated.  6. The mitral valve is abnormal. Trivial mitral valve regurgitation.  7. Tricuspid valve regurgitation is mild to moderate.  8. The aortic valve is tricuspid. Aortic valve regurgitation is not  visualized. Mild to moderate aortic valve sclerosis/calcification is  present, without any evidence of aortic stenosis.   Comparison(s): Changes from prior study are noted. 12/06/2018: LVEF 25-30%,  global hypokinesis.   FINDINGS  Left Ventricle: Left ventricular ejection fraction, by estimation, is 20  to 25%. The left ventricle has severely decreased function. The left  ventricle demonstrates global hypokinesis. The left ventricular internal  cavity size was normal in size. There  is mild left ventricular hypertrophy. Left ventricular diastolic function  could not be evaluated due to atrial fibrillation. Left ventricular  diastolic function could not be  evaluated.   Right Ventricle: The right ventricular size is not well visualized. Right  vetricular wall thickness was not well visualized. Right ventricular  systolic function was not well visualized. There is moderately elevated  pulmonary artery systolic pressure. The  tricuspid regurgitant velocity is 3.47 m/s, and with an assumed right  atrial pressure of 8 mmHg, the estimated right ventricular systolic  pressure is 99991111 mmHg.   Left Atrium: Left atrial size was mildly dilated.   Right Atrium: Right atrial size was normal in size.   Pericardium: There is no evidence of pericardial effusion.   Mitral Valve: The mitral valve is abnormal. There is mild thickening of  the mitral valve leaflet(s). Mild to moderate mitral annular  calcification. Trivial mitral valve regurgitation.   Tricuspid Valve: The tricuspid valve is grossly normal. Tricuspid valve  regurgitation is mild to moderate.   Aortic Valve: The aortic valve is tricuspid. Aortic valve regurgitation is  not visualized. Mild to moderate aortic valve sclerosis/calcification is  present, without any evidence of aortic stenosis.   Pulmonic Valve: The pulmonic valve was grossly normal. Pulmonic valve  regurgitation is trivial.   Aorta: The aortic root and ascending aorta are structurally normal, with  no evidence of dilitation.   IAS/Shunts: No atrial level shunt detected by color flow Doppler.     LEFT VENTRICLE  PLAX 2D  LVIDd:     5.40 cm  LVIDs:     3.20 cm  LV PW:     1.00 cm  LV IVS:    1.20 cm  LVOT diam:   1.90 cm  LV SV:     35  LV SV Index:  14  LVOT Area:   2.84 cm     LEFT ATRIUM      Index  LA diam:   5.70 cm 2.28 cm/m  LA Vol (A4C): 87.9 ml 35.18 ml/m  AORTIC VALVE  LVOT Vmax:  69.20 cm/s  LVOT Vmean: 52.550 cm/s  LVOT VTI:  0.124 m    AORTA  Ao Root diam: 3.10 cm  Ao Asc diam: 3.00 cm   TRICUSPID VALVE  TR Peak grad:  48.2 mmHg  TR Vmax:     347.00 cm/s    SHUNTS  Systemic VTI: 0.12 m  Systemic Diam: 1.90 cm   Antimicrobials:  Anti-infectives (From admission, onward)  None        Subjective: Seen and examined at bedside and is frustrated with his telemetry leads and his pulse ox.  States that he has been knocking things over and had a wound on his finger and states that it was wrapped so heavily that he would knock stuff off but he ripped it off.  Nursing states that he needs pain very anxious into his finger now off.  Continues to have significant mount of pruritus and itching.  No nausea or vomiting.  He is urinating quite frequently and losing some weight but still feels bloated and has some shortness of breath.  No other concerns or plans at this time.  Objective: Vitals:   04/05/20 2009 04/05/20 2249 04/06/20 0100 04/06/20 0451  BP: 127/77   (!) 106/58  Pulse: 99 (!) 122  100  Resp: 18 (!) 23  19  Temp: 97.8 F (36.6 C)   97.6 F (36.4 C)  TempSrc: Oral   Oral  SpO2: 100% 99%  96%  Weight:   (!) 145.7 kg   Height:        Intake/Output Summary (Last 24 hours) at 04/06/2020 1548 Last data filed at 04/06/2020 1456 Gross per 24 hour  Intake 1679.38 ml  Output 6580 ml  Net -4900.62 ml   Filed Weights   04/04/20 1700 04/05/20 0640 04/06/20 0100  Weight: (!) 150 kg (!) 149.3 kg (!) 145.7 kg   Examination: Physical Exam:  Constitutional: WN/WD super morbidly obese Caucasian male in mild Distress and is uncomfortable sitting in the chair at bedside  Eyes: Lids and conjunctivae normal, sclerae anicteric  ENMT: External Ears, Nose appear normal. Grossly normal hearing.  Neck: Appears normal, supple, no cervical masses, normal ROM, no appreciable thyromegaly Difficult to assess JVD given body habitus Respiratory: Diminished to auscultation bilaterally with coarse breath sounds and some crackles, no wheezing, rales, rhonchi. Normal respiratory effort and patient is not tachypenic. No accessory muscle use.  Wearing Supplemental O2 via [place] Cardiovascular: RRR, no murmurs / rubs / gallops. S1 and S2 auscultated. 1-2 + LE edema Abdomen: Soft, non-tender, Distended 2/2 to body habitus. Bowel sounds positive.  GU: Deferred. Musculoskeletal: No clubbing / cyanosis of digits/nails. No joint deformity upper and lower extremities.  Skin: No rashes, lesions, but has some chronic LE venous stasis changes. No induration; Warm and dry.  Neurologic: CN 2-12 grossly intact with no focal deficits. Romberg sign and cerebellar reflexes not assessed.  Psychiatric: Normal judgment and insight. Alert and oriented x 3. Normal mood and appropriate affect.   Data Reviewed: I have personally reviewed following labs and imaging studies  CBC: Recent Labs  Lab 03/13/2020 2019 04/04/20 1449 04/05/20 0441 04/06/20 0454  WBC 6.3 8.6 7.3 7.2  NEUTROABS 5.9  --  4.4 4.6  HGB 9.2* 9.4* 9.0* 9.3*  HCT 33.6* 34.6* 31.8* 33.4*  MCV 84.2 84.0 82.8 82.1  PLT 319 367 365 [id number]   Basic Metabolic Panel: Recent Labs  Lab 04/02/2020 2019 04/04/20 1449 04/05/20 0441 04/06/20 0454  NA 139 139 138 137  K 5.3* 4.6 4.4 3.7  CL 104 103 102 95*  CO2 '25 28 28 30  '$ GLUCOSE 238* 150* 69* 96  BUN 35* 40* 43* 43*  CREATININE 1.38* 1.75* 1.60* 1.51*  CALCIUM 8.6* 8.7* 8.5* 9.0  MG  --  2.3 2.3 2.1  PHOS  --  4.1 4.5 4.0   GFR: Estimated Creatinine Clearance: 64.8 mL/min (A) (by C-G formula based on SCr of  1.51 mg/dL (H)). Liver Function Tests: Recent Labs  Lab 03/14/2020 2019 04/04/20 1449 04/05/20 0441 04/06/20 0454  AST 35 '31 25 25  '$ ALT 50* 48* 46* 44  ALKPHOS 92 81 82 82  BILITOT 0.6 0.7 0.5 1.0  PROT 6.6 6.7 6.4* 6.8  ALBUMIN 3.1* 3.1* 3.1* 3.3*   No results for input(s): LIPASE, AMYLASE in the last 168 hours. No results for input(s): AMMONIA in the last 168 hours. Coagulation Profile: Recent Labs  Lab 03/25/2020 2019  INR 1.2   Cardiac Enzymes: No results for input(s): CKTOTAL, CKMB, CKMBINDEX, TROPONINI in the  last 168 hours. BNP (last 3 results) No results for input(s): PROBNP in the last 8760 hours. HbA1C: No results for input(s): HGBA1C in the last 72 hours. CBG: Recent Labs  Lab 04/05/20 1120 04/05/20 1549 04/05/20 2101 04/06/20 0617 04/06/20 1148  GLUCAP 70 103* 170* 96 143*   Lipid Profile: No results for input(s): CHOL, HDL, LDLCALC, TRIG, CHOLHDL, LDLDIRECT in the last 72 hours. Thyroid Function Tests: Recent Labs    04/06/20 0454  TSH 6.902*  FREET4 0.98   Anemia Panel: No results for input(s): VITAMINB12, FOLATE, FERRITIN, TIBC, IRON, RETICCTPCT in the last 72 hours. Sepsis Labs: No results for input(s): PROCALCITON, LATICACIDVEN in the last 168 hours.  Recent Results (from the past 240 hour(s))  SARS Coronavirus 2 by RT PCR (hospital order, performed in [facility] hospital lab) Nasopharyngeal Nasopharyngeal Swab     Status: None   Collection Time: 03/25/2020  9:51 PM   Specimen: Nasopharyngeal Swab  Result Value Ref Range Status   SARS Coronavirus 2 NEGATIVE NEGATIVE Final    Comment: Performed at [facility], [place] [address] [place], [place] [address]     RN Pressure Injury Documentation: Pressure Injury 04/04/20 Heel Left Stage 3 -  Full thickness tissue loss. Subcutaneous fat may be visible but bone, tendon or muscle are NOT exposed. (Active)  04/04/20   Location: Heel  Location Orientation: Left  Staging: Stage 3 -  Full thickness tissue loss. Subcutaneous fat may be visible but bone, tendon or muscle are NOT exposed.  Wound Description (Comments):   Present on Admission: Yes     Pressure Injury 04/04/20 Left Stage 3 -  Full thickness tissue loss. Subcutaneous fat may be visible but bone, tendon or muscle are NOT exposed. (Active)  04/04/20   Location:   Location Orientation: Left  Staging: Stage 3 -  Full thickness tissue loss. Subcutaneous fat may be visible but bone, tendon or muscle are NOT exposed.  Wound Description (Comments):   Present on  Admission: Yes     Pressure Injury 04/04/20 Buttocks Right Stage 2 -  Partial thickness loss of dermis presenting as a shallow open injury with a red, pink wound bed without slough. (Active)  04/04/20   Location: Buttocks  Location Orientation: Right  Staging: Stage 2 -  Partial thickness loss of dermis presenting as a shallow open injury with a red, pink wound bed without slough.  Wound Description (Comments):   Present on Admission: Yes     Estimated body mass index is 50.29 kg/m as calculated from the following:   Height as of this encounter: '5\' 7"'$  (1.702 m).   Weight as of this encounter: 145.7 kg.  Malnutrition Type:   Malnutrition Characteristics:   Nutrition Interventions:    Radiology Studies: DG CHEST PORT 1 VIEW  Result Date: 04/06/2020 CLINICAL DATA:  Diastolic and systolic heart failure EXAM: PORTABLE CHEST 1 VIEW  COMPARISON:  None. FINDINGS: Lordotic projection. Stable enlarged cardiac silhouette. Low lung volumes. No upper lobe edema or pneumothorax. PICC line unchanged IMPRESSION: Cardiomegaly and low lung volumes. Electronically Signed   By: Suzy Bouchard M.D.   On: 04/06/2020 07:24   DG CHEST PORT 1 VIEW  Result Date: 04/05/2020 CLINICAL DATA:  Shortness of breath EXAM: PORTABLE CHEST 1 VIEW COMPARISON:  04/04/2020 FINDINGS: Cardiomegaly and vascular pedicle widening. Vascular congestion. No Kerley lines, effusion, or pneumothorax. New volume loss at the right base with hazy/streaky density. Remote rib fractures. IMPRESSION: 1. Cardiomegaly and vascular congestion. 2. New/increased atelectasis at the right base. Electronically Signed   By: Monte Fantasia M.D.   On: 04/05/2020 10:27   DG CHEST PORT 1 VIEW  Result Date: 04/04/2020 CLINICAL DATA:  PICC placement EXAM: PORTABLE CHEST 1 VIEW COMPARISON:  03/15/2020 FINDINGS: Left arm PICC tip. The tip is difficult to see but appears to be in the SVC. Cardiac enlargement.  Mild vascular congestion.  Negative for  edema. Patchy bibasilar airspace disease left greater than right unchanged. No significant effusion. IMPRESSION: PICC tip in the SVC Persistent bibasilar atelectasis or pneumonia. Electronically Signed   By: Franchot Gallo M.D.   On: 04/04/2020 20:39   Scheduled Meds: . apixaban  5 mg Oral BID  . atorvastatin  40 mg Oral Daily  . Chlorhexidine Gluconate Cloth  6 each Topical Daily  . insulin aspart  0-20 Units Subcutaneous TID WC  . insulin glargine  22 Units Subcutaneous BID  . potassium chloride  20 mEq Oral BID  . sodium chloride flush  3 mL Intravenous Q12H  . spironolactone  12.5 mg Oral Daily   Continuous Infusions: . amiodarone 30 mg/hr (04/06/20 0558)  . furosemide (LASIX) 200 mg in dextrose 5% 100 mL ('2mg'$ /mL) infusion 20 mg/hr (04/06/20 1359)  . milrinone 0.25 mcg/kg/min (04/06/20 1358)    LOS: 3 days   Kerney Elbe, DO [facility] PAGER is on [place]  If 7PM-7AM, please contact night-coverage [email or web address]

## 2020-04-06 NOTE — Progress Notes (Signed)
Pt doesn't want to wear CPAP for the night. 

## 2020-04-06 NOTE — Progress Notes (Signed)
Advanced Heart Failure Rounding Note   Subjective:    Milrinone 0.125 started 1/29 for co-ox 48%. Co-ox down to 46% this am  Now on lasix gtt at 20. Massive diuresis with almost 7L out overnight. Weight down 8 pounds overnight. Creatinine improving 1.7 -> 1.6 -> 1.5  Still feels bloated and SOB but still with orthopnea and PND   Remains in AF with RVR but rate somewhat improved with IV amio.    Objective:   Weight Range:  Vital Signs:   Temp:  [97.5 F (36.4 C)-97.8 F (36.6 C)] 97.6 F (36.4 C) (01/30 0451) Pulse Rate:  [99-122] 100 (01/30 0451) Resp:  [10-23] 19 (01/30 0451) BP: (88-145)/(58-112) 106/58 (01/30 0451) SpO2:  [96 %-100 %] 96 % (01/30 0451) Weight:  [145.7 kg] 145.7 kg (01/30 0100) Last BM Date: 04/05/20  Weight change: Filed Weights   04/04/20 1700 04/05/20 0640 04/06/20 0100  Weight: (!) 150 kg (!) 149.3 kg (!) 145.7 kg    Intake/Output:   Intake/Output Summary (Last 24 hours) at 04/06/2020 1046 Last data filed at 04/06/2020 1045 Gross per 24 hour  Intake 2039.38 ml  Output 7905 ml  Net -5865.62 ml     Physical Exam: General:  Obese male sitting up in chair HEENT: normal Neck: supple. JVP to ear . Carotids 2+ bilat; no bruits. No lymphadenopathy or thryomegaly appreciated. Cor: PMI nondisplaced. Irregular tachy No rubs, gallops or murmurs. Lungs: deceased  Abdomen: obese soft, nontender, + distended. No hepatosplenomegaly. No bruits or masses. Good bowel sounds. Extremities: no cyanosis, clubbing, rash, 3+ edema  + wound Neuro: alert & orientedx3, cranial nerves grossly intact. moves all 4 extremities w/o difficulty. Affect pleasant   Telemetry: AF 100-110 Personally reviewed  Labs: Basic Metabolic Panel: Recent Labs  Lab 03/26/2020 2019 04/04/20 1449 04/05/20 0441 04/06/20 0454  NA 139 139 138 137  K 5.3* 4.6 4.4 3.7  CL 104 103 102 95*  CO2 '25 28 28 30  '$ GLUCOSE 238* 150* 69* 96  BUN 35* 40* 43* 43*  CREATININE 1.38* 1.75*  1.60* 1.51*  CALCIUM 8.6* 8.7* 8.5* 9.0  MG  --  2.3 2.3 2.1  PHOS  --  4.1 4.5 4.0    Liver Function Tests: Recent Labs  Lab 03/08/2020 2019 04/04/20 1449 04/05/20 0441 04/06/20 0454  AST 35 '31 25 25  '$ ALT 50* 48* 46* 44  ALKPHOS 92 81 82 82  BILITOT 0.6 0.7 0.5 1.0  PROT 6.6 6.7 6.4* 6.8  ALBUMIN 3.1* 3.1* 3.1* 3.3*   No results for input(s): LIPASE, AMYLASE in the last 168 hours. No results for input(s): AMMONIA in the last 168 hours.  CBC: Recent Labs  Lab 03/20/2020 2019 04/04/20 1449 04/05/20 0441 04/06/20 0454  WBC 6.3 8.6 7.3 7.2  NEUTROABS 5.9  --  4.4 4.6  HGB 9.2* 9.4* 9.0* 9.3*  HCT 33.6* 34.6* 31.8* 33.4*  MCV 84.2 84.0 82.8 82.1  PLT 319 367 365 398    Cardiac Enzymes: No results for input(s): CKTOTAL, CKMB, CKMBINDEX, TROPONINI in the last 168 hours.  BNP: BNP (last 3 results) Recent Labs    12/10/19 0057 02/13/20 0807 03/24/2020 2019  BNP 154.7* 111.6* 195.0*    ProBNP (last 3 results) No results for input(s): PROBNP in the last 8760 hours.    Other results:  Imaging: DG CHEST PORT 1 VIEW  Result Date: 04/06/2020 CLINICAL DATA:  Diastolic and systolic heart failure EXAM: PORTABLE CHEST 1 VIEW COMPARISON:  None. FINDINGS: Lordotic  projection. Stable enlarged cardiac silhouette. Low lung volumes. No upper lobe edema or pneumothorax. PICC line unchanged IMPRESSION: Cardiomegaly and low lung volumes. Electronically Signed   By: Suzy Bouchard M.D.   On: 04/06/2020 07:24   DG CHEST PORT 1 VIEW  Result Date: 04/05/2020 CLINICAL DATA:  Shortness of breath EXAM: PORTABLE CHEST 1 VIEW COMPARISON:  04/04/2020 FINDINGS: Cardiomegaly and vascular pedicle widening. Vascular congestion. No Kerley lines, effusion, or pneumothorax. New volume loss at the right base with hazy/streaky density. Remote rib fractures. IMPRESSION: 1. Cardiomegaly and vascular congestion. 2. New/increased atelectasis at the right base. Electronically Signed   By: Monte Fantasia  M.D.   On: 04/05/2020 10:27   DG CHEST PORT 1 VIEW  Result Date: 04/04/2020 CLINICAL DATA:  PICC placement EXAM: PORTABLE CHEST 1 VIEW COMPARISON:  04/05/2020 FINDINGS: Left arm PICC tip. The tip is difficult to see but appears to be in the SVC. Cardiac enlargement.  Mild vascular congestion.  Negative for edema. Patchy bibasilar airspace disease left greater than right unchanged. No significant effusion. IMPRESSION: PICC tip in the SVC Persistent bibasilar atelectasis or pneumonia. Electronically Signed   By: Franchot Gallo M.D.   On: 04/04/2020 20:39   ECHOCARDIOGRAM COMPLETE  Result Date: 04/04/2020    ECHOCARDIOGRAM REPORT   Patient Name:   MUNEER GURGANIOUS [facility] Date of Exam: 04/04/2020 Medical Rec #:  [id number]               Height:       67.0 in Accession #:    [id number]              Weight:       329.0 lb Date of Birth:  27-May-1950               BSA:          2.499 m Patient Age:    70 years                BP:           134/83 mmHg Patient Gender: M                       HR:           116 bpm. Exam Location:  Inpatient Procedure: 2D Echo, Cardiac Doppler and Color Doppler Indications:    CHF-Acute Systolic [id number]  History:        Patient has prior history of Echocardiogram examinations, most                 recent 12/06/2018. Cardiomyopathy and CHF, CAD, Arrythmias:Atrial                 Fibrillation; Risk Factors:Hypertension and Diabetes.  Sonographer:    Bernadene Person RDCS Referring Phys: [id number] Candace Gallus MELVIN  Sonographer Comments: Exam ended due to pt refusal during exam. IMPRESSIONS  1. Limited study, patient asked the tech to discontinue the test before it was completed  2. Atrial fibrillation with rapid ventricular response was noted throughout the study  3. Left ventricular ejection fraction, by estimation, is 20 to 25%. The left ventricle has severely decreased function. The left ventricle demonstrates global hypokinesis. There is mild left ventricular hypertrophy. Left ventricular  diastolic function could not be evaluated.  4. Right ventricular systolic function was not well visualized. The right ventricular size is not well visualized. There is moderately elevated pulmonary artery systolic pressure.  5. Left atrial size was mildly  dilated.  6. The mitral valve is abnormal. Trivial mitral valve regurgitation.  7. Tricuspid valve regurgitation is mild to moderate.  8. The aortic valve is tricuspid. Aortic valve regurgitation is not visualized. Mild to moderate aortic valve sclerosis/calcification is present, without any evidence of aortic stenosis. Comparison(s): Changes from prior study are noted. 12/06/2018: LVEF 25-30%, global hypokinesis. FINDINGS  Left Ventricle: Left ventricular ejection fraction, by estimation, is 20 to 25%. The left ventricle has severely decreased function. The left ventricle demonstrates global hypokinesis. The left ventricular internal cavity size was normal in size. There is mild left ventricular hypertrophy. Left ventricular diastolic function could not be evaluated due to atrial fibrillation. Left ventricular diastolic function could not be evaluated. Right Ventricle: The right ventricular size is not well visualized. Right vetricular wall thickness was not well visualized. Right ventricular systolic function was not well visualized. There is moderately elevated pulmonary artery systolic pressure. The  tricuspid regurgitant velocity is 3.47 m/s, and with an assumed right atrial pressure of 8 mmHg, the estimated right ventricular systolic pressure is [id number] mmHg. Left Atrium: Left atrial size was mildly dilated. Right Atrium: Right atrial size was normal in size. Pericardium: There is no evidence of pericardial effusion. Mitral Valve: The mitral valve is abnormal. There is mild thickening of the mitral valve leaflet(s). Mild to moderate mitral annular calcification. Trivial mitral valve regurgitation. Tricuspid Valve: The tricuspid valve is grossly normal. Tricuspid  valve regurgitation is mild to moderate. Aortic Valve: The aortic valve is tricuspid. Aortic valve regurgitation is not visualized. Mild to moderate aortic valve sclerosis/calcification is present, without any evidence of aortic stenosis. Pulmonic Valve: The pulmonic valve was grossly normal. Pulmonic valve regurgitation is trivial. Aorta: The aortic root and ascending aorta are structurally normal, with no evidence of dilitation. IAS/Shunts: No atrial level shunt detected by color flow Doppler.  LEFT VENTRICLE PLAX 2D LVIDd:         5.40 cm LVIDs:         3.20 cm LV PW:         1.00 cm LV IVS:        1.20 cm LVOT diam:     1.90 cm LV SV:         35 LV SV Index:   14 LVOT Area:     2.84 cm  LEFT ATRIUM           Index LA diam:      5.70 cm 2.28 cm/m LA Vol (A4C): 87.9 ml 35.18 ml/m  AORTIC VALVE LVOT Vmax:   69.20 cm/s LVOT Vmean:  52.550 cm/s LVOT VTI:    0.124 m  AORTA Ao Root diam: 3.10 cm Ao Asc diam:  3.00 cm TRICUSPID VALVE TR Peak grad:   48.2 mmHg TR Vmax:        347.00 cm/s  SHUNTS Systemic VTI:  0.12 m Systemic Diam: 1.90 cm Lyman Bishop MD Electronically signed by Lyman Bishop MD Signature Date/Time: 04/04/2020/1:39:04 PM    Final    [place] EKG SITE RITE  Result Date: 04/04/2020 If Site Rite image not attached, placement could not be confirmed due to current cardiac rhythm.    Medications:     Scheduled Medications: . apixaban  5 mg Oral BID  . atorvastatin  40 mg Oral Daily  . Chlorhexidine Gluconate Cloth  6 each Topical Daily  . insulin aspart  0-20 Units Subcutaneous TID WC  . insulin glargine  22 Units Subcutaneous BID  . sodium chloride flush  3 mL Intravenous Q12H    Infusions: . amiodarone 30 mg/hr (04/06/20 0558)  . furosemide (LASIX) 200 mg in dextrose 5% 100 mL ('2mg'$ /mL) infusion 20 mg/hr (04/06/20 0755)  . milrinone 0.125 mcg/kg/min (04/06/20 0448)    PRN Medications: acetaminophen **OR** acetaminophen, [COMPLETED] diphenhydrAMINE **FOLLOWED BY** diphenhydrAMINE,  hydrocortisone cream, ondansetron (ZOFRAN) IV, polyethylene glycol, sodium chloride flush   Assessment/Plan:   1.  Acute on chronic systolic heart failure - Echo (11/2018) EF 25-30%, trace MR, trivial TR, moderately reduced RSVF.   - Cath (12/2018) RA 10, RV 55/8, PA 54/15 (27), PCW = 16, Fick CO/CI = 7.0/2.8 - Echo (04/04/2020) EF 45-50%, mild LVH, hypokinesis LV, dilated LA, dilated RV/RA, mod MR, mod-severe TR, RVSP > 60 mmHg.  - Milrinone started on 1/29 for co-ox 48%. Co-ox remains low (46%) but clinically much improved with increased urine output and renal function. Repeat co-ox now.  - Likely triggered by AF  - Continue lasix gtt - Holding Entresto, dig and spiro for now. Will restart spiro 12.5   2. Persistent AF - has been on amio at home - suspect has been in AF for several weeks. ECG in 12/21 was NSR - continue IV amio while on milrinone - TFTs not too bad - continue Eliquis - TEE DC-CV this week when HF improved  3. Chronic respiratory failure/OSA - Continue with CPAP at night for OSA  4. CKD 3b - Baseline Cr 1.5-1.7 - improved to 1.5 today - start [place] prior to d/c  5. Anemia/Hx of GIB - Hgb stable at 9.3 - EGD 12/2019 nonbleeding duodenal ulcer with gastritis - Continue to monitor  6. Venous stasis wounds L tib/fib, L heel & Sacral wound - Wound care consulted. Primary team managing   7. T2DM - A1c 12.4% recent hospitalization - Per primary team - Add SGLT2i prior to d/c  8. Morbidly obese - BMI 51.5  9. Lung nodule - Will need outpatient imaging in 3 months, 9 mm CT (03/2020)    Length of Stay: 3   [name] MD 04/06/2020, 10:46 AM  Advanced Heart Failure Team Pager [phone] (M-F; 7a - 4p)  Please contact [facility] for night-coverage after hours (4p -7a ) and weekends on [email or web address]

## 2020-04-07 ENCOUNTER — Inpatient Hospital Stay (HOSPITAL_COMMUNITY): Payer: Medicare HMO

## 2020-04-07 DIAGNOSIS — Z9989 Dependence on other enabling machines and devices: Secondary | ICD-10-CM | POA: Diagnosis not present

## 2020-04-07 DIAGNOSIS — I480 Paroxysmal atrial fibrillation: Secondary | ICD-10-CM | POA: Diagnosis not present

## 2020-04-07 DIAGNOSIS — I4819 Other persistent atrial fibrillation: Secondary | ICD-10-CM | POA: Diagnosis not present

## 2020-04-07 DIAGNOSIS — I10 Essential (primary) hypertension: Secondary | ICD-10-CM | POA: Diagnosis not present

## 2020-04-07 DIAGNOSIS — R0602 Shortness of breath: Secondary | ICD-10-CM | POA: Diagnosis not present

## 2020-04-07 DIAGNOSIS — E1169 Type 2 diabetes mellitus with other specified complication: Secondary | ICD-10-CM | POA: Diagnosis not present

## 2020-04-07 DIAGNOSIS — G4733 Obstructive sleep apnea (adult) (pediatric): Secondary | ICD-10-CM | POA: Diagnosis not present

## 2020-04-07 DIAGNOSIS — I5023 Acute on chronic systolic (congestive) heart failure: Secondary | ICD-10-CM | POA: Diagnosis not present

## 2020-04-07 DIAGNOSIS — E785 Hyperlipidemia, unspecified: Secondary | ICD-10-CM | POA: Diagnosis not present

## 2020-04-07 DIAGNOSIS — I4891 Unspecified atrial fibrillation: Secondary | ICD-10-CM | POA: Diagnosis not present

## 2020-04-07 DIAGNOSIS — I517 Cardiomegaly: Secondary | ICD-10-CM | POA: Diagnosis not present

## 2020-04-07 LAB — COMPREHENSIVE METABOLIC PANEL
ALT: 32 U/L (ref 0–44)
AST: 16 U/L (ref 15–41)
Albumin: 2.9 g/dL — ABNORMAL LOW (ref 3.5–5.0)
Alkaline Phosphatase: 78 U/L (ref 38–126)
Anion gap: 15 (ref 5–15)
BUN: 42 mg/dL — ABNORMAL HIGH (ref 8–23)
CO2: 29 mmol/L (ref 22–32)
Calcium: 8 mg/dL — ABNORMAL LOW (ref 8.9–10.3)
Chloride: 85 mmol/L — ABNORMAL LOW (ref 98–111)
Creatinine, Ser: 1.79 mg/dL — ABNORMAL HIGH (ref 0.61–1.24)
GFR, Estimated: 41 mL/min — ABNORMAL LOW (ref >60)
Glucose, Bld: 495 mg/dL — ABNORMAL HIGH (ref 70–99)
Potassium: 3.4 mmol/L — ABNORMAL LOW (ref 3.5–5.1)
Sodium: 129 mmol/L — ABNORMAL LOW (ref 135–145)
Total Bilirubin: 0.8 mg/dL (ref 0.3–1.2)
Total Protein: 6.1 g/dL — ABNORMAL LOW (ref 6.5–8.1)

## 2020-04-07 LAB — CBC WITH DIFFERENTIAL/PLATELET
Abs Immature Granulocytes: 0.04 10*3/uL (ref 0.00–0.07)
Basophils Absolute: 0.1 10*3/uL (ref 0.0–0.1)
Basophils Relative: 1 %
Eosinophils Absolute: 0.3 10*3/uL (ref 0.0–0.5)
Eosinophils Relative: 4 %
HCT: 32 % — ABNORMAL LOW (ref 39.0–52.0)
Hemoglobin: 9.1 g/dL — ABNORMAL LOW (ref 13.0–17.0)
Immature Granulocytes: 1 %
Lymphocytes Relative: 15 %
Lymphs Abs: 1 10*3/uL (ref 0.7–4.0)
MCH: 23 pg — ABNORMAL LOW (ref 26.0–34.0)
MCHC: 28.4 g/dL — ABNORMAL LOW (ref 30.0–36.0)
MCV: 81 fL (ref 80.0–100.0)
Monocytes Absolute: 0.8 10*3/uL (ref 0.1–1.0)
Monocytes Relative: 12 %
Neutro Abs: 4.6 10*3/uL (ref 1.7–7.7)
Neutrophils Relative %: 67 %
Platelets: 378 10*3/uL (ref 150–400)
RBC: 3.95 MIL/uL — ABNORMAL LOW (ref 4.22–5.81)
RDW: 25.5 % — ABNORMAL HIGH (ref 11.5–15.5)
WBC: 6.9 10*3/uL (ref 4.0–10.5)
nRBC: 0 % (ref 0.0–0.2)

## 2020-04-07 LAB — COOXEMETRY PANEL
Carboxyhemoglobin: 1.2 % (ref 0.5–1.5)
Carboxyhemoglobin: 1.5 % (ref 0.5–1.5)
Methemoglobin: 0.8 % (ref 0.0–1.5)
Methemoglobin: 1.1 % (ref 0.0–1.5)
O2 Saturation: 35.9 %
O2 Saturation: 51.2 %
Total hemoglobin: 9.1 g/dL — ABNORMAL LOW (ref 12.0–16.0)
Total hemoglobin: 9.3 g/dL — ABNORMAL LOW (ref 12.0–16.0)

## 2020-04-07 LAB — GLUCOSE, CAPILLARY
Glucose-Capillary: 119 mg/dL — ABNORMAL HIGH (ref 70–99)
Glucose-Capillary: 145 mg/dL — ABNORMAL HIGH (ref 70–99)
Glucose-Capillary: 179 mg/dL — ABNORMAL HIGH (ref 70–99)
Glucose-Capillary: 153 mg/dL — ABNORMAL HIGH (ref 70–99)

## 2020-04-07 LAB — PHOSPHORUS: Phosphorus: 3.3 mg/dL (ref 2.5–4.6)

## 2020-04-07 LAB — MAGNESIUM: Magnesium: 1.8 mg/dL (ref 1.7–2.4)

## 2020-04-07 LAB — T3, FREE: T3, Free: 2.5 pg/mL (ref 2.0–4.4)

## 2020-04-07 MED ORDER — POTASSIUM CHLORIDE CRYS ER 20 MEQ PO TBCR
40.0000 meq | EXTENDED_RELEASE_TABLET | Freq: Two times a day (BID) | ORAL | Status: DC
  Administered 2020-04-07 – 2020-04-15 (×15): 40 meq via ORAL
  Filled 2020-04-07 (×16): qty 2

## 2020-04-07 MED ORDER — POTASSIUM CHLORIDE CRYS ER 20 MEQ PO TBCR
40.0000 meq | EXTENDED_RELEASE_TABLET | Freq: Once | ORAL | Status: AC
  Administered 2020-04-07: 40 meq via ORAL
  Filled 2020-04-07: qty 2

## 2020-04-07 MED ORDER — MAGNESIUM SULFATE 2 GM/50ML IV SOLN
2.0000 g | Freq: Once | INTRAVENOUS | Status: AC
  Administered 2020-04-07: 2 g via INTRAVENOUS
  Filled 2020-04-07: qty 50

## 2020-04-07 MED ORDER — MILRINONE LACTATE IN DEXTROSE 20-5 MG/100ML-% IV SOLN: 0.3750 ug/kg/min | INTRAVENOUS | Status: DC

## 2020-04-07 NOTE — Evaluation (Addendum)
Occupational Therapy Evaluation Patient Details Name: Raymond Pratt MRN: [id number] DOB: [birth year] Today's Date: 04/07/2020    History of Present Illness Blanchard Alesi is a 70 y.o. male with medical history significant of combined systolic and diastolic heart failure, atrial fibrillation, anemia, cellulitis, GI bleed, hypertension, OSA on CPAP, diabetes, nonobstructive CAD who presents with worsening shortness of breath. Found to have acute exacerbation of CHF.   Clinical Impression   This 70 yo male admitted with above presents to acute OT with PLOF of being able to do all of his basic ADLs albeit with increased time per his report. Currently he is limited in his independence/Mod I due to excess fluid, Afib with increased heart rate , and extreme itching . He will continue to benefit form acute OT with follow up at [facility] to work back towards his PLOF.     Follow Up Recommendations  Home health OT;Supervision - Intermittent    Equipment Recommendations  None recommended by OT       Precautions / Restrictions Precautions Precautions: Fall Precaution Comments: watch HR, wound on left heel and front or ankle Restrictions Weight Bearing Restrictions: No      Mobility Bed Mobility               General bed mobility comments: OOB in chair    Transfers Overall transfer level: Needs assistance Equipment used: Rolling walker (2 wheeled) Transfers: Sit to/from Stand Sit to Stand: Supervision              Balance Overall balance assessment: Mild deficits observed, not formally tested                                         ADL either performed or assessed with clinical judgement   ADL Overall ADL's : Needs assistance/impaired Eating/Feeding: Modified independent;Sitting Eating/Feeding Details (indicate cue type and reason): issued pt built up red handle tubing and a rocker knife Grooming: Set up;Supervision/safety;Sitting   Upper  Body Bathing: Set up;Supervision/ safety;Sitting   Lower Body Bathing: Moderate assistance Lower Body Bathing Details (indicate cue type and reason): S sit<>stand Upper Body Dressing : Set up;Sitting   Lower Body Dressing: Maximal assistance Lower Body Dressing Details (indicate cue type and reason): S sit<.stand Toilet Transfer: Minimal assistance   Toileting- Clothing Manipulation and Hygiene: Minimal assistance Toileting - Clothing Manipulation Details (indicate cue type and reason): S sit<>stand       General ADL Comments: Pt reports he is having more difficulty with LB ADLs due to the extra fluid on him     Vision Baseline Vision/History: No visual deficits              Pertinent Vitals/Pain Pain Assessment: Faces Faces Pain Scale: Hurts even more Pain Location: itching all over Pain Descriptors / Indicators: Tender;Sore Pain Intervention(s): Limited activity within patient's tolerance;Monitored during session (pt reports he is to get meds for itching at 2:00pm)     Hand Dominance Right   Extremity/Trunk Assessment Upper Extremity Assessment Upper Extremity Assessment: RUE deficits/detail RUE Deficits / Details: Decreased AROM in shoulder and reports has noticed a change in hand function as well since he fell recently     Communication Communication Communication: No difficulties   Cognition Arousal/Alertness: Awake/alert Behavior During Therapy: WFL for tasks assessed/performed Overall Cognitive Status: Within Functional Limits for tasks assessed  Home Living Family/patient expects to be discharged to:: Private residence Living Arrangements: Other relatives (granddaughter) Available Help at Discharge: Family;Available PRN/intermittently Type of Home: House Home Access: Stairs to enter     Home Layout: Two level;Able to live on main level with bedroom/bathroom     Bathroom Shower/Tub:  Walk-in shower;Curtain   Bathroom Toilet: Handicapped height     Home Equipment: [place] - single point;Walker - 2 wheels;Shower seat          Prior Functioning/Environment Level of Independence: Independent with assistive device(s)        Comments: Uses walker, limited community ambulator; reports he does not wear socks alot (has wound on left heal and front of foot at ankle--makes it hard to put left sock on in addition to excess fluid)        OT Problem List: Decreased strength;Decreased range of motion;Impaired balance (sitting and/or standing);Obesity;Impaired UE functional use;Cardiopulmonary status limiting activity;Decreased knowledge of use of DME or AE      OT Treatment/Interventions: Self-care/ADL training    OT Goals(Current goals can be found in the care plan section) Acute Rehab OT Goals Patient Stated Goal: "be able to walk to and from my car." OT Goal Formulation: With patient Time For Goal Achievement: 04/12/2020 Potential to Achieve Goals: Good  OT Frequency: Min 2X/week              AM-PAC OT "6 Clicks" Daily Activity     Outcome Measure Help from another person eating meals?: None Help from another person taking care of personal grooming?: A Little Help from another person toileting, which includes using toliet, bedpan, or urinal?: A Little Help from another person bathing (including washing, rinsing, drying)?: A Lot Help from another person to put on and taking off regular upper body clothing?: A Little Help from another person to put on and taking off regular lower body clothing?: A Lot 6 Click Score: 17   End of Session Equipment Utilized During Treatment: Rolling walker Nurse Communication:  (IVs beeping due to two were complete)  Activity Tolerance: Patient tolerated treatment well Patient left: in chair;with call bell/phone within reach  OT Visit Diagnosis: Unsteadiness on feet (R26.81);Muscle weakness (generalized) (M62.81)                 Time: 1239-1300 OT Time Calculation (min): 21 min Charges:  OT General Charges $OT Visit: 1 Visit OT Evaluation $OT Eval Moderate Complexity: [place], OTR/L Acute [employer] Pager [phone] Office [phone]   Almon Register 04/07/2020, 1:44 PM

## 2020-04-07 NOTE — TOC Initial Note (Signed)
Transition of Care [facility]) - Initial/Assessment Note    Patient Details  Name: Raymond Pratt MRN: [id number] Date of Birth: Sep 06, 1950  [facility] of [facility]) CM/SW Contact:    Zenon Mayo, RN Phone Number: 04/07/2020, 4:27 PM  Clinical Narrative:                 NCM spoke with patient at bedside, he lives with his grandkids . He is active with [facility] for [facility], this was confirmed with [facility].  Patient has PCP, Dr. Birdena Crandall at [facility] [phone].  They are working on orders thur [employer] for [employer] and wheelchair, this is confirmed with [facility] with [employer], but she states they called today and put the orders on hold.  Patient has a scale at home and weighs himself daily.  conts on iv lasix , was on entresto pta , now starting on [email or web address] pay for refills is 45.00 , this NCM informed patient. He states he does not have any issues with transportation or getting food.    Expected Discharge Plan: [place] Barriers to Discharge: Continued Medical Work up   Patient Goals and CMS Choice Patient states their goals for this hospitalization and ongoing recovery are:: get better CMS Medicare.gov Compare Post Acute Care list provided to:: Patient Choice offered to / list presented to : Patient  Expected Discharge Plan and Services Expected Discharge Plan: [place]   Discharge Planning Services: CM Consult Post Acute Care Choice: [place] Living arrangements for the past 2 months: Single Family Home                   DME Agency: NA       HH Arranged: RN,PT [place] Agency: [place] Date [facility] Agency Contacted: 04/07/20 Time HH Agency Contacted: 60 Representative spoke with at [place]: Tommi Rumps  Prior Living Arrangements/Services Living arrangements for the past 2 months: [place] Lives with:: Relatives Patient language and need for interpreter  reviewed:: Yes Do you feel safe going back to the place where you live?: Yes      Need for Family Participation in Patient Care: Yes (Comment) Care giver support system in place?: Yes (comment) Current home services: Home RN,Home PT Criminal Activity/Legal Involvement Pertinent to Current Situation/Hospitalization: No - Comment as needed  Activities of Daily Living Home Assistive Devices/Equipment: Cane (specify quad or straight),Wheelchair,Walker (specify type) ADL Screening (condition at time of admission) Patient's cognitive ability adequate to safely complete daily activities?: Yes Is the patient deaf or have difficulty hearing?: No Does the patient have difficulty seeing, even when wearing glasses/contacts?: No Does the patient have difficulty concentrating, remembering, or making decisions?: No Patient able to express need for assistance with ADLs?: Yes Does the patient have difficulty dressing or bathing?: No Independently performs ADLs?: Yes (appropriate for developmental age) Does the patient have difficulty walking or climbing stairs?: Yes Weakness of Legs: Both Weakness of Arms/Hands: None  Permission Sought/Granted                  Emotional Assessment Appearance:: Appears stated age Attitude/Demeanor/Rapport: Engaged Affect (typically observed): Appropriate Orientation: : Oriented to Self,Oriented to Place,Oriented to  Time,Oriented to Situation Alcohol / Substance Use: Not Applicable Psych Involvement: No (comment)  Admission diagnosis:  Acute exacerbation of CHF (congestive heart failure) ([place]) [I50.9] Acute on chronic systolic congestive heart failure ([place]) [I50.23] Acute respiratory failure with hypoxia ([place]) [J96.01] Patient  Active Problem List   Diagnosis Date Noted  . Pressure injury of skin 04/04/2020  . Acute exacerbation of CHF (congestive heart failure) ([place]) 03/16/2020  . Atrial fibrillation with RVR ([place]) 03/10/2020  . Duodenal ulcer   . AKI  (acute kidney injury) ([place]) 12/10/2019  . Acute blood loss anemia 12/10/2019  . Gastrointestinal hemorrhage with melena 12/10/2019  . Elevated troponin   . Elevated INR   . CHF (congestive heart failure) ([place]) 12/05/2018  . Anemia   . Gram-negative bacteremia 02/12/2014  . Chills (without fever) 02/11/2014  . Acute liver failure 12/28/2013  . Acute on chronic heart failure ([place]) 12/28/2013  . A-fib ([place]) 03/07/2013  . HTN (hypertension) 02/16/2013  . Chronic combined systolic and diastolic heart failure ([place]) 02/01/2013  . Anticoagulated on Coumadin 01/15/2013  . Hyposmolality and/or hyponatremia 03/04/2012  . OSA on CPAP 03/04/2012  . Acute on chronic combined systolic and diastolic heart failure, NYHA class 4 ([place]) 02/28/2012  . Chest pressure 02/28/2012  . Type 2 diabetes mellitus with hyperlipidemia ([place]) 08/04/2008  . ATRIAL FIBRILLATION WITH RAPID VENTRICULAR RESPONSE 08/04/2008  . UTI 08/04/2008  . CELLULITIS, LEGS 08/04/2008  . OTHER ASCITES 08/04/2008   PCP:  Shon Baton, MD Pharmacy:   [place], [place] - [phone] [place] [id number] [place] [address] Phone: [phone] Fax: [phone]     Social Determinants of Health (SDOH) Interventions    Readmission Risk Interventions Readmission Risk Prevention Plan 04/07/2020 12/12/2019  Transportation Screening Complete Complete  PCP or Specialist Appt within 3-5 Days Complete Complete  HRI or Home Care Consult Complete -  Social Work Consult for [place] Planning/Counseling Complete -  Palliative Care Screening Not Applicable Not Applicable  Medication Review Press photographer) Complete -  Some recent data might be hidden

## 2020-04-07 NOTE — Progress Notes (Addendum)
Advanced Heart Failure Rounding Note   Subjective:    Milrinone 0.125 started 1/29 for co-ox 48%. Increased to 0.25 yesterday.  Co-ox down to 36% this am (STAT repeat 51%)   Remains in Afib w/ RVR 110-120s.   Scr worse, 1.51>>1.79. No hypotension.  K 3.4 Mg 1.8  On lasix gtt at 20. -4L out overnight.  Remains markedly fluid overloaded. CVP 17.    Comfortable at rest. No dyspnea. Ambulated w/ PT earlier this am w/o exertional dyspnea.    Objective:   Weight Range:  Vital Signs:   Temp:  [97.6 F (36.4 C)-97.9 F (36.6 C)] 97.9 F (36.6 C) (01/31 0348) Pulse Rate:  [111-115] 115 (01/31 0348) Resp:  [18-19] 18 (01/31 0348) BP: (100-123)/(62-92) 123/92 (01/31 0348) SpO2:  [96 %-97 %] 97 % (01/31 0348) Weight:  [141.4 kg] 141.4 kg (01/31 0345) Last BM Date: 04/05/20  Weight change: Filed Weights   04/05/20 0640 04/06/20 0100 04/07/20 0345  Weight: (!) 149.3 kg (!) 145.7 kg (!) 141.4 kg    Intake/Output:   Intake/Output Summary (Last 24 hours) at 04/07/2020 0829 Last data filed at 04/07/2020 0806 Gross per 24 hour  Intake 964 ml  Output 3750 ml  Net -2786 ml     PHYSICAL EXAM:  CVP 17  General:  Obese, chronically ill appearing WM sitting up in chair. No respiratory difficulty HEENT: normal Neck: supple. Thick neck, JVD elevated to ear. Carotids 2+ bilat; no bruits. No lymphadenopathy or thyromegaly appreciated. Cor: PMI nondisplaced. Irregularly irregular rhythm, tachy rate. No rubs, gallops or murmurs. Lungs: decreased BS at the bases bilaterally  Abdomen: obese/ distended and edematous, nontender No hepatosplenomegaly. No bruits or masses. Good bowel sounds. Extremities: no cyanosis, clubbing, rash, 3+ bilateral LEE up to thighs, bilateral venous stasis dermatisis + RUE PICC  Neuro: alert & oriented x 3, cranial nerves grossly intact. moves all 4 extremities w/o difficulty. Affect pleasant.   Telemetry: AF 110s-120s Personally reviewed  Labs: Basic  Metabolic Panel: Recent Labs  Lab 03/23/2020 2019 04/04/20 1449 04/05/20 0441 04/06/20 0454 04/07/20 0700  NA 139 139 138 137 129*  K 5.3* 4.6 4.4 3.7 3.4*  CL 104 103 102 95* 85*  CO2 '25 28 28 30 29  '$ GLUCOSE 238* 150* 69* 96 495*  BUN 35* 40* 43* 43* 42*  CREATININE 1.38* 1.75* 1.60* 1.51* 1.79*  CALCIUM 8.6* 8.7* 8.5* 9.0 8.0*  MG  --  2.3 2.3 2.1 1.8  PHOS  --  4.1 4.5 4.0 3.3    Liver Function Tests: Recent Labs  Lab 03/17/2020 2019 04/04/20 1449 04/05/20 0441 04/06/20 0454 04/07/20 0700  AST 35 '31 25 25 16  '$ ALT 50* 48* 46* 44 32  ALKPHOS 92 81 82 82 78  BILITOT 0.6 0.7 0.5 1.0 0.8  PROT 6.6 6.7 6.4* 6.8 6.1*  ALBUMIN 3.1* 3.1* 3.1* 3.3* 2.9*   No results for input(s): LIPASE, AMYLASE in the last 168 hours. No results for input(s): AMMONIA in the last 168 hours.  CBC: Recent Labs  Lab 03/08/2020 2019 04/04/20 1449 04/05/20 0441 04/06/20 0454  WBC 6.3 8.6 7.3 7.2  NEUTROABS 5.9  --  4.4 4.6  HGB 9.2* 9.4* 9.0* 9.3*  HCT 33.6* 34.6* 31.8* 33.4*  MCV 84.2 84.0 82.8 82.1  PLT 319 367 365 398    Cardiac Enzymes: No results for input(s): CKTOTAL, CKMB, CKMBINDEX, TROPONINI in the last 168 hours.  BNP: BNP (last 3 results) Recent Labs    12/10/19 0057  02/13/20 0807 03/26/2020 2019  BNP 154.7* 111.6* 195.0*    ProBNP (last 3 results) No results for input(s): PROBNP in the last 8760 hours.    Other results:  Imaging: DG CHEST PORT 1 VIEW  Result Date: 04/07/2020 CLINICAL DATA:  Shortness of breath EXAM: PORTABLE CHEST 1 VIEW COMPARISON:  Yesterday FINDINGS: Marked cardiomegaly. Unchanged vascular pedicle widening with congested appearance of central vessels. Mildly improved lung volumes. No edema or air bronchogram. Left PICC with tip at the upper right atrium. IMPRESSION: 1. Improved lung volumes. 2. Cardiomegaly and vascular congestion. Electronically Signed   By: Monte Fantasia M.D.   On: 04/07/2020 07:27   DG CHEST PORT 1 VIEW  Result Date:  04/06/2020 CLINICAL DATA:  Diastolic and systolic heart failure EXAM: PORTABLE CHEST 1 VIEW COMPARISON:  None. FINDINGS: Lordotic projection. Stable enlarged cardiac silhouette. Low lung volumes. No upper lobe edema or pneumothorax. PICC line unchanged IMPRESSION: Cardiomegaly and low lung volumes. Electronically Signed   By: Suzy Bouchard M.D.   On: 04/06/2020 07:24     Medications:     Scheduled Medications: . apixaban  5 mg Oral BID  . atorvastatin  40 mg Oral Daily  . Chlorhexidine Gluconate Cloth  6 each Topical Daily  . insulin aspart  0-20 Units Subcutaneous TID WC  . insulin glargine  22 Units Subcutaneous BID  . potassium chloride SA  20 mEq Oral BID  . sodium chloride flush  3 mL Intravenous Q12H  . spironolactone  12.5 mg Oral Daily    Infusions: . amiodarone 30 mg/hr (04/07/20 0547)  . furosemide (LASIX) 200 mg in dextrose 5% 100 mL ('2mg'$ /mL) infusion 20 mg/hr (04/07/20 0240)  . magnesium sulfate bolus IVPB    . milrinone 0.25 mcg/kg/min (04/07/20 0238)    PRN Medications: acetaminophen **OR** acetaminophen, [COMPLETED] diphenhydrAMINE **FOLLOWED BY** diphenhydrAMINE, hydrocortisone cream, hydrOXYzine, ondansetron (ZOFRAN) IV, polyethylene glycol, sodium chloride flush   Assessment/Plan:   1.  Acute on chronic systolic heart failure - Echo (11/2018) EF 25-30%, trace MR, trivial TR, moderately reduced RSVF.   - Cath (12/2018) RA 10, RV 55/8, PA 54/15 (27), PCW = 16, Fick CO/CI = 7.0/2.8 - Echo (04/04/2020) EF 45-50%, mild LVH, hypokinesis LV, dilated LA, dilated RV/RA, mod MR, mod-severe TR, RVSP > 60 mmHg.  - Milrinone started on 1/29 for co-ox 48%.  - Co-ox remains low today at 51% on 0.25 of milrinone. - Increase milrinone to 0.375. - Likely triggered by AF, monitor closely while on inotropes  - Continue lasix gtt at 20 mg/hr. Add 5 of metolazone.  - Holding [place] and dig for now.  - Continue Spiro 12.5 mg   2. Persistent AF - has been on amio at home -  suspect has been in AF for several weeks. ECG in 12/21 was NSR - continue IV amio while on milrinone - TFTs not too bad - continue Eliquis - TEE DC-CV this week when HF improved  3. Chronic respiratory failure/OSA - Continue with CPAP at night for OSA  4. CKD 3b - Baseline Cr 1.5-1.7 - SCr up 1.5>>1.8 today  - increase inotropes per above  - start [place] prior to d/c  5. Anemia/Hx of GIB - Hgb stable at 9.1 - EGD 12/2019 nonbleeding duodenal ulcer with gastritis - Continue to monitor  6. Venous stasis wounds L tib/fib, L heel & Sacral wound - Wound care consulted. Primary team managing   7. T2DM - A1c 12.4% recent hospitalization - Per primary team - Add SGLT2i  prior to d/c  8. Morbidly obese Body mass index is 48.83 kg/m.  9. Lung nodule - Will need outpatient imaging in 3 months, 9 mm CT (03/2020)  10. Hypokalemia/ Hypomagnesemia - K 3.4, Mg 1.8  - supp w/ KCl/ MgSO4    11. Hypervolemic Hyponatremia - Na 129 - continue diuresis - fluid restrict     Length of Stay: 4   Brittainy Simmons PA-C 04/07/2020, 8:29 AM  Advanced Heart Failure Team Pager [phone] (M-F; 7a - 4p)  Please contact [facility] for night-coverage after hours (4p -7a ) and weekends on [email or web address]  Patient seen and examined with the above-signed Advanced Practice Provider and/or Housestaff. I personally reviewed laboratory data, imaging studies and relevant notes. I independently examined the patient and formulated the important aspects of the plan. I have edited the note to reflect any of my changes or salient points. I have personally discussed the plan with the patient and/or family.  Remains on milrinone and lasix gtt at 20. Co-ox remains low. Diuresing but still markedly fluid overloaded. Remains in AF with RVR on amio gtt. Creatinine slightly worse  General:  Markedly obese male sitting up in chair No resp difficulty HEENT: normal Neck: supple. JVP to ear Carotids 2+  bilat; no bruits. No lymphadenopathy or thryomegaly appreciated. Cor: PMI nondisplaced. irregular tachy Lungs: clear Abdomen: obese soft, nontender, + distended. No hepatosplenomegaly. No bruits or masses. Good bowel sounds. Extremities: no cyanosis, clubbing, rash, 3+ edema Neuro: alert & orientedx3, cranial nerves grossly intact. moves all 4 extremities w/o difficulty. Affect pleasant  Remains very tenuous. Hemodynamics marginal despite milrinone 0.25. Will increase milrinone to 0.375. Continue lasix gtt and IV amio. Once fully diuresed will need TEE /DC-CV.   [name], MD  10:26 AM

## 2020-04-07 NOTE — Progress Notes (Addendum)
HOSPITAL MEDICINE OVERNIGHT EVENT NOTE    Patient complaining of severe pruritus throughout the evening.  Patient has extremity symptoms worse over the chest, abdomen and back but also the extremities.  Nursing reports that patient has been scratching himself so vigorously that there are multiple excoriations that are forming with significant amounts of bleeding.  There is no obvious concurrent rash of the torso.  Daytime team has placed the patient on a combination of Benadryl, hydroxyzine and topical steroids.  This combination has not been very effective thus far.  Chart reviewed, patient has no evidence of active renal or hepatic disease.  Patient does not have a history of HIV.  Patient is quite a poor historian but reports that his symptoms of pruritus began after hospitalization, bringing up concern for a drug side effect/intolerance.  Notably, patient is receiving intravenous Lasix infusion (was on Torsemide at home) as well as Milrenone.    While not common, I do question whether 1 of these medications, perhaps the Lasix infusion may be causing the patient's intense pruritus.  Will defer to day team to review patient's symptoms and determine if any medication changes indicated.  Vernelle Emerald  MD [facility]

## 2020-04-07 NOTE — Evaluation (Signed)
Physical Therapy Evaluation Patient Details Name: Raymond Pratt MRN: [id number] DOB: October 25, 1950 Today's Date: 04/07/2020   History of Present Illness  Raymond Pratt is a 70 y.o. male with medical history significant of combined systolic and diastolic heart failure, atrial fibrillation, anemia, cellulitis, GI bleed, hypertension, OSA on CPAP, diabetes, nonobstructive CAD who presents with worsening shortness of breath. Found to have acute exacerbation of CHF.  Clinical Impression  Prior to admission, pt lives with his granddaughter and uses a walker for mobility. Pt presents with generalized weakness and decreased cardiopulmonary endurance. Ambulating x 60 feet with a walker at a supervision level, HR 118-139 bpm, SpO2 92-96% on RA. Pt denies dyspnea on exertion. Recommending HHPT to maximize functional mobility.     Follow Up Recommendations Home health PT    Equipment Recommendations  None recommended by PT    Recommendations for Other Services       Precautions / Restrictions Precautions Precautions: Fall;Other (comment) Precaution Comments: watch HR Restrictions Weight Bearing Restrictions: No      Mobility  Bed Mobility               General bed mobility comments: OOB in chair    Transfers Overall transfer level: Needs assistance Equipment used: Rolling walker (2 wheeled) Transfers: Sit to/from Stand Sit to Stand: Supervision            Ambulation/Gait Ambulation/Gait assistance: Supervision Gait Distance (Feet): 60 Feet Assistive device: Rolling walker (2 wheeled) Gait Pattern/deviations: Step-through pattern;Decreased stride length     General Gait Details: Slow and steady pace, supervision for safety  Stairs            Wheelchair Mobility    Modified Rankin (Stroke Patients Only)       Balance Overall balance assessment: Mild deficits observed, not formally tested                                            Pertinent Vitals/Pain Pain Assessment: Faces Faces Pain Scale: Hurts even more Pain Location: L foot with palpation Pain Descriptors / Indicators: Tender;Sore Pain Intervention(s): Limited activity within patient's tolerance;Monitored during session;Patient requesting pain meds-RN notified    Home Living Family/patient expects to be discharged to:: Private residence Living Arrangements: Other relatives (granddaughter) Available Help at Discharge: Family;Available PRN/intermittently Type of Home: House Home Access: Stairs to enter     Home Layout: Two level;Able to live on main level with bedroom/bathroom Home Equipment: Kasandra Knudsen - single point;Walker - 2 wheels      Prior Function Level of Independence: Independent with assistive device(s)         Comments: Uses walker, limited community ambulator     Hand Dominance   Dominant Hand: Right    Extremity/Trunk Assessment   Upper Extremity Assessment Upper Extremity Assessment: Defer to OT evaluation    Lower Extremity Assessment Lower Extremity Assessment: Generalized weakness;RLE deficits/detail;LLE deficits/detail RLE Deficits / Details: Increased edema LLE Deficits / Details: Increased edema    Cervical / Trunk Assessment Cervical / Trunk Assessment: Other exceptions Cervical / Trunk Exceptions: Increased body habitus  Communication   Communication: No difficulties  Cognition Arousal/Alertness: Awake/alert Behavior During Therapy: WFL for tasks assessed/performed Overall Cognitive Status: Within Functional Limits for tasks assessed  General Comments      Exercises     Assessment/Plan    PT Assessment Patient needs continued PT services  PT Problem List Decreased strength;Decreased activity tolerance;Decreased balance;Decreased mobility;Pain;Obesity       PT Treatment Interventions DME instruction;Gait training;Stair training;Functional  mobility training;Therapeutic activities;Therapeutic exercise;Balance training;Patient/family education    PT Goals (Current goals can be found in the Care Plan section)  Acute Rehab PT Goals Patient Stated Goal: "be able to walk to and from my car." PT Goal Formulation: With patient Time For Goal Achievement: 05/03/2020 Potential to Achieve Goals: Good    Frequency Min 3X/week   Barriers to discharge        Co-evaluation               AM-PAC PT "6 Clicks" Mobility  Outcome Measure Help needed turning from your back to your side while in a flat bed without using bedrails?: None Help needed moving from lying on your back to sitting on the side of a flat bed without using bedrails?: None Help needed moving to and from a bed to a chair (including a wheelchair)?: None Help needed standing up from a chair using your arms (e.g., wheelchair or bedside chair)?: None Help needed to walk in hospital room?: None Help needed climbing 3-5 steps with a railing? : A Little 6 Click Score: 23    End of Session   Activity Tolerance: Patient tolerated treatment well Patient left: in chair;with call bell/phone within reach Nurse Communication: Mobility status PT Visit Diagnosis: Difficulty in walking, not elsewhere classified (R26.2)    Time: [id number] PT Time Calculation (min) (ACUTE ONLY): 33 min   Charges:   PT Evaluation $PT Eval Moderate Complexity: 1 Mod PT Treatments $Therapeutic Activity: 8-22 mins        Wyona Almas, PT, DPT Acute Rehabilitation Services Pager [phone] Office [phone]   Deno Etienne 04/07/2020, 11:33 AM

## 2020-04-07 NOTE — Progress Notes (Signed)
CBC complete and sent to lab

## 2020-04-07 NOTE — Progress Notes (Signed)
   04/07/20 1210  Vitals  Temp 97.8 F (36.6 C)  Temp Source Oral  BP (!) 129/91  MAP (mmHg) 101  BP Location Right Arm  BP Method Automatic  Patient Position (if appropriate) Lying  Pulse Rate (!) 121  Pulse Rate Source Monitor  Resp 19  Oxygen Therapy  SpO2 97 %  O2 Device Room Air  Invasive Hemodynamic Monitoring  CVP (mmHg) 18 mmHg (Sitting in the chair)  MEWS Score  MEWS Temp 0  MEWS Systolic 0  MEWS Pulse 2  MEWS RR 0  MEWS LOC 0  MEWS Score 2  MEWS Score Color Yellow  Chronic 2-4

## 2020-04-07 NOTE — Progress Notes (Signed)
PROGRESS NOTE    Raymond Pratt  [id number] DOB: [birth year] DOA: 03/27/2020 PCP: Shon Baton, MD  Brief Narrative:  HPI per [name] on 03/16/2020 Raymond Pratt is a 70 y.o. male with medical history significant of combined systolic and diastolic heart failure, atrial fibrillation, anemia, cellulitis, GI bleed, hypertension, OSA on CPAP, diabetes, nonobstructive CAD who presents with worsening shortness of breath.             Patient states he has had worsening shortness of breath for the past day.  He also reports edema and states that he checked his weight and noted it has gone up by 30 to 50 pounds.  He states he has been compliant with diet and fluid intake.  He says his warfarin, digoxin, spironolactone were stopped during previous admissions. He states his symptoms are similar to his previous CHF exacerbations.  He typically uses 2 L supplemental O2 at home and had to increase it to 3 L last night and is on 3-4L in the ED.  He also reports per his home scale his weight has increased from 280 pounds to 330 pounds. He denies fevers, chills, chest pain, abdominal pain, constipation, diarrhea, nausea, vomiting.  ED Course: Vital signs significant for 4 L required to maintain saturations, heart rate in the one hundreds to one thirties, respirations in the teens to twenties.  Lab work-up showed BMP with potassium of 5.3, creatinine of 1.38 which is stable, glucose 230.  LFTs showed calcium of 8.6 which corrects when considering albumin of 3.1, ALT 50.  CBC showed hemoglobin of 9.2 which is stable.  PT/INR were normal.  BNP only mildly elevated at 195 the setting of obesity.  Troponin initially 20 with repeat pending.  Covid antigen negative Covid PCR screen pending.  Digoxin level low at less than 0.2.  Chest x-ray showed cardiomegaly, pulmonary vascular congestion and perihilar edema.  Patient started on Lasix cardiology was consulted and will see the patient in the  morning.  **Interim History  Cardiology was consulted and started diuresis on the patient and started anticoagulation.  They are placing a PICC line to monitor his volume status and coags.  Further care per cardiology and his renal function went up slightly.  Will need to continue monitor him extremely carefully given comorbidities.  Patient's coox was 47.6 this morning and remained in A. fib with heart rates in the 110s to 120s and he has not been diuresing very well and remained short of breath.  Cardiology has now changed him to a Lasix drip and have started the patient on milrinone.  They are holding his Entresto, digoxin and spironolactone for now.  Weight is down a little bit but continues to Diurese and down approximately 6 Liters. Cr is improving slightly.   Assessment & Plan:   Principal Problem:   Acute exacerbation of CHF (congestive heart failure) (HCC) Active Problems:   Type 2 diabetes mellitus with hyperlipidemia (HCC)   OSA on CPAP   Anticoagulated on Coumadin   HTN (hypertension)   A-fib (HCC)   Anemia   Atrial fibrillation with RVR (HCC)   Pressure injury of skin  Acute on chronic systolic CHF exacerbation > Last EF in 2020 was 20-30%, with hypokinesis. > Also A. fib with RVR which is likely worsening his exacerbation and hemodynamics. > States he is compliant with his medications but has had a gradual weight gain edema and shortness of breath and now has had a total of 50 pound  weight gain has had multiple multiple liters diuresed during previous admissions. > States he has had digoxin and spironolactone discontinued during recent admissions the spironolactone is possibly related to elevated potassium levels but unclear. - Cardiology consulted in ED and will see the patient in the morning, appreciate recommendations.  Audiology has evaluated him and patient continues to have marked volume overload in the setting of his recurrent atrial fibrillation.  They have increased  his IV Lasix and added metolazone and also placed TED hose.  Cardiology is also starting Eliquis for anticoagulation and recommending a TEE/DCCV once he is diuresed adequately.  They will place a PICC line and follow CVP and coox daily. - Now on a Lasix gtt and Milrinone gtt (was increased from 0.125 -> 0.25 yesterday and will be increased to 0.375 today ) .  His coox showed low output 35.9% this AM and Repeat was 51.2% -Echocardiogram being repeated showed an LVEF of 20 to 25% with global hypokinesis -Daily weights -Strict I/O he is -9.5756 L since admission; Weight is down from 329 -> 321 -> 311 - Is on Entresto is currently being held by cardiology as he is getting diuresis and holding his Digoxin; Now they will be restarting his Spironolactone to 12.5 mg po BID -CXR yesterday AM showed "Cardiomegaly and low lung volumes." -C/w Lasix gtt, Milrinone gtt  and Further Care per Cardiology and they are going to add Metolazone 5 mg po Daily now   Atrial fibrillation with RVR > Presented with Elevated HR ranging from 115-140's > Dilt drip was avoided due to his low output heart failure but he did respond well to single dose of metoprolol -Digoxin Level was <0.2  -Had Uncontrolled rates and cardiology is to start him on milrinone and Ehrmann checked his thyroid function tests for anemia induced hyperthyroidism - He is now on amiodarone drip per cardiology recommendations - Continued home amiodarone at 200 mg p.o. twice daily but now since his heart rate remains uncontrolled cardiology starting him on amiodarone drip and checking thyroid function tests; TFTS done and showed Elevated TSH at 6.902 but normal T4 of 0.98 -He has been given IV metoprolol tartrate 5 mg x 2 -Cardiology started anticoagulation with apixaban and they are recommending continuing and then recommending a TEE/DCCV this coming week when his heart failure is improved and fully diuresed -Cardiology recommending continuing IV Amio while  on a Milrinone gtt  Acquired Thrombophilia -In the setting of his atrial fibrillation -Continue Apixaban 5 mg p.o. twice daily  AKI on CKD stage IIIa -In the setting of his volume overload and diuresis  -Patient's BUNs/creatinine went from 35/1.3 is now 40/1.75 -> 43/1.60 -> 43/1.51 -> 42/1.79 -Baseline creatinine ranged from 1.5-1.7 and he presented with a baseline creatinine better than his baseline -avoid further nephrotoxic medications, contrast dyes, hypotension and renally dose medications -His Delene Loll is currently being held but his Spironolactone is being added as well as Metolazone 5 mg -Repeat CMP in a.m. and continue to monitor renal function closely.  Diabetes Mellitus Type 2 -Home regimen is 60 units long-acting twice daily -Last hemoglobin A1c was 12.4 and his most recent hospitalization -We will give 30 units long-acting twice daily and SSI with resistant sliding scale AC -CBGs ranging from 119-221  Venous stasis wounds -Patient has stasis changes on lower extremities with wounds on his left lower extremity -No signs of infection at this time, wounds are managed by home health -We will consult wound care  Nonobstructive CAD -Continue home atorvastatin  40 mg and aspirin 81 mg -Cardiology following   OSA Chronic Respiratory Failure with Hypoxia -SpO2: 97 % O2 Flow Rate (L/min): 5 L/min -Continue home CPAP with Home Setting of [place] with 5 Liters Bleed in -Continue to Monitor Volume Status Carefully  Hyperkalemia -> Hypokalemia  -K 5.3 on admission and is now trended down -We will monitor for now as he will be getting Lasix for CHF exacerbation -K+ now 3.4 -Mag level was 1.8 so will replete with IV Mag Sulfate 2 grams  -Continue to Monitor and Trend -Replete with po KCl 40 mEQ x1 and then 40 mEQ BID starting tonight at 2200 -Delene Loll is currently being held due to hyperkalemia and renal failure -Repeat CMP in the AM  Hyponatremia -Na+ Went from 137  -> 129 -In the setting of Diuresis and Hypervolemic Hyponatremia -Cardiology recommending Fluid Restricting  -Continue to Monitor closely and Trend -Repeat CMP in the AM   Normocytic Anemia -Patient's Hgb/Hct went from 9.2/33.6 -> 9.4/34.6 -> 9.0/31.8 -> 9.3/33.4 ->  9.1/32.0 -Check Anemia Panel in the AM -Continue to Monitor for S/Sx of Bleeding; Currently no overt bleeding noted -Repeat CBC in the AM   Pruritis -Likely medication related as T Bili is not elevated  -C/w Benadryl 25 mg po q6hprn -C/w Hydrocortisone Cream -Will add Hydroxyzine 25 mg po TID  Lung nodule  -Outpatient follow-up in 3 months given that he is 9 mm nodule found on CT scan  Pressure Injuries, poA Pressure Injury 04/04/20 Heel Left Stage 3 -  Full thickness tissue loss. Subcutaneous fat may be visible but bone, tendon or muscle are NOT exposed. (Active)  04/04/20   Location: Heel  Location Orientation: Left  Staging: Stage 3 -  Full thickness tissue loss. Subcutaneous fat may be visible but bone, tendon or muscle are NOT exposed.  Wound Description (Comments):   Present on Admission: Yes     Pressure Injury 04/04/20 Left Stage 3 -  Full thickness tissue loss. Subcutaneous fat may be visible but bone, tendon or muscle are NOT exposed. (Active)  04/04/20   Location:   Location Orientation: Left  Staging: Stage 3 -  Full thickness tissue loss. Subcutaneous fat may be visible but bone, tendon or muscle are NOT exposed.  Wound Description (Comments):   Present on Admission: Yes     Pressure Injury 04/04/20 Buttocks Right Stage 2 -  Partial thickness loss of dermis presenting as a shallow open injury with a red, pink wound bed without slough. (Active)  04/04/20   Location: Buttocks  Location Orientation: Right  Staging: Stage 2 -  Partial thickness loss of dermis presenting as a shallow open injury with a red, pink wound bed without slough.  Wound Description (Comments):   Present on Admission: Yes  -WOC  Nurse Consulted   Super Morbid Obesity -Complicates overall prognosis and care -Estimated body mass index is 48.83 kg/m as calculated from the following:   Height as of this encounter: '5\' 7"'$  (1.702 m).   Weight as of this encounter: 141.4 kg. -Weight Loss and Dietary Counseling given   DVT prophylaxis: Anticoagulated with Eliquis Code Status: FULL CODE  Family Communication: No family present at bedside Disposition Plan: Pending further cardiac clearance and diuresis back to dry weight; PT/OT recommending Home Health when stable to D/C   Status is: Inpatient  Remains inpatient appropriate because:Unsafe d/c plan, IV treatments appropriate due to intensity of illness or inability to take PO and Inpatient level of care appropriate due to severity  of illness   Dispo: The patient is from: Home              Anticipated d/c is to: TBD              Anticipated d/c date is: 2 days              Patient currently is not medically stable to d/c.   Difficult to place patient No   Consultants:   Cardiology   Procedures:  ECHOCARDIOGRAM IMPRESSIONS    1. Limited study, patient asked the tech to discontinue the test before  it was completed  2. Atrial fibrillation with rapid ventricular response was noted  throughout the study  3. Left ventricular ejection fraction, by estimation, is 20 to 25%. The  left ventricle has severely decreased function. The left ventricle  demonstrates global hypokinesis. There is mild left ventricular  hypertrophy. Left ventricular diastolic function  could not be evaluated.  4. Right ventricular systolic function was not well visualized. The right  ventricular size is not well visualized. There is moderately elevated  pulmonary artery systolic pressure.  5. Left atrial size was mildly dilated.  6. The mitral valve is abnormal. Trivial mitral valve regurgitation.  7. Tricuspid valve regurgitation is mild to moderate.  8. The aortic valve is  tricuspid. Aortic valve regurgitation is not  visualized. Mild to moderate aortic valve sclerosis/calcification is  present, without any evidence of aortic stenosis.   Comparison(s): Changes from prior study are noted. 12/06/2018: LVEF 25-30%,  global hypokinesis.   FINDINGS  Left Ventricle: Left ventricular ejection fraction, by estimation, is 20  to 25%. The left ventricle has severely decreased function. The left  ventricle demonstrates global hypokinesis. The left ventricular internal  cavity size was normal in size. There  is mild left ventricular hypertrophy. Left ventricular diastolic function  could not be evaluated due to atrial fibrillation. Left ventricular  diastolic function could not be evaluated.   Right Ventricle: The right ventricular size is not well visualized. Right  vetricular wall thickness was not well visualized. Right ventricular  systolic function was not well visualized. There is moderately elevated  pulmonary artery systolic pressure. The  tricuspid regurgitant velocity is 3.47 m/s, and with an assumed right  atrial pressure of 8 mmHg, the estimated right ventricular systolic  pressure is 99991111 mmHg.   Left Atrium: Left atrial size was mildly dilated.   Right Atrium: Right atrial size was normal in size.   Pericardium: There is no evidence of pericardial effusion.   Mitral Valve: The mitral valve is abnormal. There is mild thickening of  the mitral valve leaflet(s). Mild to moderate mitral annular  calcification. Trivial mitral valve regurgitation.   Tricuspid Valve: The tricuspid valve is grossly normal. Tricuspid valve  regurgitation is mild to moderate.   Aortic Valve: The aortic valve is tricuspid. Aortic valve regurgitation is  not visualized. Mild to moderate aortic valve sclerosis/calcification is  present, without any evidence of aortic stenosis.   Pulmonic Valve: The pulmonic valve was grossly normal. Pulmonic valve  regurgitation is  trivial.   Aorta: The aortic root and ascending aorta are structurally normal, with  no evidence of dilitation.   IAS/Shunts: No atrial level shunt detected by color flow Doppler.     LEFT VENTRICLE  PLAX 2D  LVIDd:     5.40 cm  LVIDs:     3.20 cm  LV PW:     1.00 cm  LV IVS:  1.20 cm  LVOT diam:   1.90 cm  LV SV:     35  LV SV Index:  14  LVOT Area:   2.84 cm     LEFT ATRIUM      Index  LA diam:   5.70 cm 2.28 cm/m  LA Vol (A4C): 87.9 ml 35.18 ml/m  AORTIC VALVE  LVOT Vmax:  69.20 cm/s  LVOT Vmean: 52.550 cm/s  LVOT VTI:  0.124 m    AORTA  Ao Root diam: 3.10 cm  Ao Asc diam: 3.00 cm   TRICUSPID VALVE  TR Peak grad:  48.2 mmHg  TR Vmax:    347.00 cm/s    SHUNTS  Systemic VTI: 0.12 m  Systemic Diam: 1.90 cm   Antimicrobials:  Anti-infectives (From admission, onward)   None        Subjective: Seen and examined at bedside continues to have some itching noted.  Continues to be extremely volume overloaded but is urinating fairly well.  No chest pain, lightheadedness or dizziness.  No other concerns or plans at this time  Objective: Vitals:   04/07/20 0345 04/07/20 0348 04/07/20 1100 04/07/20 1210  BP:  (!) 123/92  (!) 129/91  Pulse:  (!) 115  (!) 121  Resp:  '18 20 19  '$ Temp:  97.9 F (36.6 C)  97.8 F (36.6 C)  TempSrc:  Oral  Oral  SpO2:  97%  97%  Weight: (!) 141.4 kg     Height:        Intake/Output Summary (Last 24 hours) at 04/07/2020 1505 Last data filed at 04/07/2020 1334 Gross per 24 hour  Intake 1090 ml  Output 3795 ml  Net -2705 ml   Filed Weights   04/05/20 0640 04/06/20 0100 04/07/20 0345  Weight: (!) 149.3 kg (!) 145.7 kg (!) 141.4 kg   Examination: Physical Exam:  Constitutional: WN/WD super morbidly obese Caucasian male in mild distress and is uncomfortable sitting in the chair at bedside  Eyes: Lids and conjunctivae normal, sclerae anicteric  ENMT: External Ears, Nose appear  normal. Grossly normal hearing.  Neck: Appears normal, supple, no cervical masses, normal ROM, no appreciable thyromegaly; Difficult to assess JVD given body habitus but CVP is elevated to 17 Respiratory: Diminished to auscultation bilaterally with coarse breath sounds and some crackles, no wheezing, rales. Normal respiratory effort and patient is not tachypenic. No accessory muscle use. Wearing Supplemental O2 via [place] Cardiovascular: Irregularly Irregular and slightly tachycardic, no murmurs / rubs / gallops. S1 and S2 auscultated. 1-2+ LE Edema Abdomen: Soft, non-tender,Distended 2/2 to body habitus.  Bowel sounds positive.  GU: Deferred. Musculoskeletal: No clubbing / cyanosis of digits/nails. No joint deformity upper and lower extremities.  Skin: No rashes, lesions, ulcers but has lower extremity venous stasis changes. No induration; Warm and dry.  Neurologic: CN 2-12 grossly intact with no focal deficits.  Romberg sign and cerebellar reflexes not assessed.  Psychiatric: Normal judgment and insight. Alert and oriented x 3. Slightly anxious mood and appropriate affect.   Data Reviewed: I have personally reviewed following labs and imaging studies  CBC: Recent Labs  Lab 03/12/2020 2019 04/04/20 1449 04/05/20 0441 04/06/20 0454 04/07/20 0819  WBC 6.3 8.6 7.3 7.2 6.9  NEUTROABS 5.9  --  4.4 4.6 4.6  HGB 9.2* 9.4* 9.0* 9.3* 9.1*  HCT 33.6* 34.6* 31.8* 33.4* 32.0*  MCV 84.2 84.0 82.8 82.1 81.0  PLT 319 367 365 398 [id number]   Basic Metabolic Panel: Recent Labs  Lab 03/22/2020  2019 04/04/20 1449 04/05/20 0441 04/06/20 0454 04/07/20 0700  NA 139 139 138 137 129*  K 5.3* 4.6 4.4 3.7 3.4*  CL 104 103 102 95* 85*  CO2 '25 28 28 30 29  '$ GLUCOSE 238* 150* 69* 96 495*  BUN 35* 40* 43* 43* 42*  CREATININE 1.38* 1.75* 1.60* 1.51* 1.79*  CALCIUM 8.6* 8.7* 8.5* 9.0 8.0*  MG  --  2.3 2.3 2.1 1.8  PHOS  --  4.1 4.5 4.0 3.3   GFR: Estimated Creatinine Clearance: 53.7 mL/min (A) (by C-G formula  based on SCr of 1.79 mg/dL (H)). Liver Function Tests: Recent Labs  Lab 04/01/2020 2019 04/04/20 1449 04/05/20 0441 04/06/20 0454 04/07/20 0700  AST 35 '31 25 25 16  '$ ALT 50* 48* 46* 44 32  ALKPHOS 92 81 82 82 78  BILITOT 0.6 0.7 0.5 1.0 0.8  PROT 6.6 6.7 6.4* 6.8 6.1*  ALBUMIN 3.1* 3.1* 3.1* 3.3* 2.9*   No results for input(s): LIPASE, AMYLASE in the last 168 hours. No results for input(s): AMMONIA in the last 168 hours. Coagulation Profile: Recent Labs  Lab 03/24/2020 2019  INR 1.2   Cardiac Enzymes: No results for input(s): CKTOTAL, CKMB, CKMBINDEX, TROPONINI in the last 168 hours. BNP (last 3 results) No results for input(s): PROBNP in the last 8760 hours. HbA1C: No results for input(s): HGBA1C in the last 72 hours. CBG: Recent Labs  Lab 04/06/20 1148 04/06/20 1613 04/06/20 2119 04/07/20 0613 04/07/20 1205  GLUCAP 143* 166* 221* 119* 145*   Lipid Profile: No results for input(s): CHOL, HDL, LDLCALC, TRIG, CHOLHDL, LDLDIRECT in the last 72 hours. Thyroid Function Tests: Recent Labs    04/06/20 0454  TSH 6.902*  FREET4 0.98  T3FREE 2.5   Anemia Panel: No results for input(s): VITAMINB12, FOLATE, FERRITIN, TIBC, IRON, RETICCTPCT in the last 72 hours. Sepsis Labs: No results for input(s): PROCALCITON, LATICACIDVEN in the last 168 hours.  Recent Results (from the past 240 hour(s))  SARS Coronavirus 2 by RT PCR (hospital order, performed in [facility] hospital lab) Nasopharyngeal Nasopharyngeal Swab     Status: None   Collection Time: 03/25/2020  9:51 PM   Specimen: Nasopharyngeal Swab  Result Value Ref Range Status   SARS Coronavirus 2 NEGATIVE NEGATIVE Final    Comment: Performed at [facility], [place] [address], [place], [place] [address]     RN Pressure Injury Documentation: Pressure Injury 04/04/20 Heel Left Stage 3 -  Full thickness tissue loss. Subcutaneous fat may be visible but bone, tendon or muscle are NOT exposed. (Active)  04/04/20    Location: Heel  Location Orientation: Left  Staging: Stage 3 -  Full thickness tissue loss. Subcutaneous fat may be visible but bone, tendon or muscle are NOT exposed.  Wound Description (Comments):   Present on Admission: Yes     Pressure Injury 04/04/20 Left Stage 3 -  Full thickness tissue loss. Subcutaneous fat may be visible but bone, tendon or muscle are NOT exposed. (Active)  04/04/20   Location:   Location Orientation: Left  Staging: Stage 3 -  Full thickness tissue loss. Subcutaneous fat may be visible but bone, tendon or muscle are NOT exposed.  Wound Description (Comments):   Present on Admission: Yes     Pressure Injury 04/04/20 Buttocks Right Stage 2 -  Partial thickness loss of dermis presenting as a shallow open injury with a red, pink wound bed without slough. (Active)  04/04/20   Location: Buttocks  Location Orientation:  Right  Staging: Stage 2 -  Partial thickness loss of dermis presenting as a shallow open injury with a red, pink wound bed without slough.  Wound Description (Comments):   Present on Admission: Yes     Estimated body mass index is 48.83 kg/m as calculated from the following:   Height as of this encounter: '5\' 7"'$  (1.702 m).   Weight as of this encounter: 141.4 kg.  Malnutrition Type:   Malnutrition Characteristics:   Nutrition Interventions:    Radiology Studies: DG CHEST PORT 1 VIEW  Result Date: 04/07/2020 CLINICAL DATA:  Shortness of breath EXAM: PORTABLE CHEST 1 VIEW COMPARISON:  Yesterday FINDINGS: Marked cardiomegaly. Unchanged vascular pedicle widening with congested appearance of central vessels. Mildly improved lung volumes. No edema or air bronchogram. Left PICC with tip at the upper right atrium. IMPRESSION: 1. Improved lung volumes. 2. Cardiomegaly and vascular congestion. Electronically Signed   By: Monte Fantasia M.D.   On: 04/07/2020 07:27   DG CHEST PORT 1 VIEW  Result Date: 04/06/2020 CLINICAL DATA:  Diastolic and systolic  heart failure EXAM: PORTABLE CHEST 1 VIEW COMPARISON:  None. FINDINGS: Lordotic projection. Stable enlarged cardiac silhouette. Low lung volumes. No upper lobe edema or pneumothorax. PICC line unchanged IMPRESSION: Cardiomegaly and low lung volumes. Electronically Signed   By: Suzy Bouchard M.D.   On: 04/06/2020 07:24   Scheduled Meds: . apixaban  5 mg Oral BID  . atorvastatin  40 mg Oral Daily  . Chlorhexidine Gluconate Cloth  6 each Topical Daily  . insulin aspart  0-20 Units Subcutaneous TID WC  . insulin glargine  22 Units Subcutaneous BID  . potassium chloride SA  40 mEq Oral BID  . sodium chloride flush  3 mL Intravenous Q12H  . spironolactone  12.5 mg Oral Daily   Continuous Infusions: . amiodarone 30 mg/hr (04/07/20 0547)  . furosemide (LASIX) 200 mg in dextrose 5% 100 mL ('2mg'$ /mL) infusion 20 mg/hr (04/07/20 1346)  . milrinone 0.375 mcg/kg/min (04/07/20 1202)    LOS: 4 days   Kerney Elbe, DO [facility] PAGER is on [place]  If 7PM-7AM, please contact night-coverage [email or web address]

## 2020-04-07 NOTE — Progress Notes (Signed)
Co ox Recollected and sent to Lab

## 2020-04-07 NOTE — TOC Benefit Eligibility Note (Signed)
Transition of Care [facility]) Benefit Eligibility Note    Patient Details  Name: Raymond Pratt MRN: [id number] Date of Birth: [birth year]   Medication/Dose: Eliquis 2.'5mg'$  and or '5mg'$ . bid foe 30 day supply  Covered?: Yes  Tier: 3 Drug  Prescription Coverage Preferred Pharmacy: [facility] with Person/Company/Phone Number:: Raymond R. W[employer] PH# [phone]  Co-Pay: $45.00  Prior Approval: No  Deductible:  (no deductible)       [name] Phone Number: 04/07/2020, 10:37 AM

## 2020-04-07 NOTE — Progress Notes (Signed)
   04/07/20 0348  Assess: MEWS Score  Temp 97.9 F (36.6 C)  BP (!) 123/92  Pulse Rate (!) 115  ECG Heart Rate (!) 121  Resp 18  SpO2 97 %  Assess: MEWS Score  MEWS Temp 0  MEWS Systolic 0  MEWS Pulse 2  MEWS RR 0  MEWS LOC 0  MEWS Score 2  MEWS Score Color Yellow  Assess: if the MEWS score is Yellow or Red  Were vital signs taken at a resting state? No (worsening pruritis)  Focused Assessment No change from prior assessment  Early Detection of Sepsis Score *See Row Information* Low  MEWS guidelines implemented *See Row Information* No, previously red, continue vital signs every 4 hours

## 2020-04-08 DIAGNOSIS — I4819 Other persistent atrial fibrillation: Secondary | ICD-10-CM | POA: Diagnosis not present

## 2020-04-08 DIAGNOSIS — I5023 Acute on chronic systolic (congestive) heart failure: Secondary | ICD-10-CM | POA: Diagnosis not present

## 2020-04-08 DIAGNOSIS — I10 Essential (primary) hypertension: Secondary | ICD-10-CM | POA: Diagnosis not present

## 2020-04-08 DIAGNOSIS — J9601 Acute respiratory failure with hypoxia: Secondary | ICD-10-CM

## 2020-04-08 DIAGNOSIS — I4891 Unspecified atrial fibrillation: Secondary | ICD-10-CM | POA: Diagnosis not present

## 2020-04-08 LAB — COMPREHENSIVE METABOLIC PANEL
ALT: 28 U/L (ref 0–44)
AST: 16 U/L (ref 15–41)
Albumin: 3.1 g/dL — ABNORMAL LOW (ref 3.5–5.0)
Alkaline Phosphatase: 73 U/L (ref 38–126)
Anion gap: 11 (ref 5–15)
BUN: 39 mg/dL — ABNORMAL HIGH (ref 8–23)
CO2: 33 mmol/L — ABNORMAL HIGH (ref 22–32)
Calcium: 8.7 mg/dL — ABNORMAL LOW (ref 8.9–10.3)
Chloride: 92 mmol/L — ABNORMAL LOW (ref 98–111)
Creatinine, Ser: 1.6 mg/dL — ABNORMAL HIGH (ref 0.61–1.24)
GFR, Estimated: 47 mL/min — ABNORMAL LOW (ref >60)
Glucose, Bld: 161 mg/dL — ABNORMAL HIGH (ref 70–99)
Potassium: 3.7 mmol/L (ref 3.5–5.1)
Sodium: 136 mmol/L (ref 135–145)
Total Bilirubin: 1.1 mg/dL (ref 0.3–1.2)
Total Protein: 6.5 g/dL (ref 6.5–8.1)

## 2020-04-08 LAB — GLUCOSE, CAPILLARY
Glucose-Capillary: 141 mg/dL — ABNORMAL HIGH (ref 70–99)
Glucose-Capillary: 194 mg/dL — ABNORMAL HIGH (ref 70–99)
Glucose-Capillary: 289 mg/dL — ABNORMAL HIGH (ref 70–99)

## 2020-04-08 LAB — COOXEMETRY PANEL
Carboxyhemoglobin: 1.7 % — ABNORMAL HIGH (ref 0.5–1.5)
Methemoglobin: 0.9 % (ref 0.0–1.5)
O2 Saturation: 52.9 %
Total hemoglobin: 9.1 g/dL — ABNORMAL LOW (ref 12.0–16.0)

## 2020-04-08 LAB — CBC WITH DIFFERENTIAL/PLATELET
Abs Immature Granulocytes: 0.03 10*3/uL (ref 0.00–0.07)
Basophils Absolute: 0.1 10*3/uL (ref 0.0–0.1)
Basophils Relative: 1 %
Eosinophils Absolute: 0.3 10*3/uL (ref 0.0–0.5)
Eosinophils Relative: 5 %
HCT: 31.2 % — ABNORMAL LOW (ref 39.0–52.0)
Hemoglobin: 8.9 g/dL — ABNORMAL LOW (ref 13.0–17.0)
Immature Granulocytes: 0 %
Lymphocytes Relative: 18 %
Lymphs Abs: 1.2 10*3/uL (ref 0.7–4.0)
MCH: 23.2 pg — ABNORMAL LOW (ref 26.0–34.0)
MCHC: 28.5 g/dL — ABNORMAL LOW (ref 30.0–36.0)
MCV: 81.5 fL (ref 80.0–100.0)
Monocytes Absolute: 0.8 10*3/uL (ref 0.1–1.0)
Monocytes Relative: 12 %
Neutro Abs: 4.4 10*3/uL (ref 1.7–7.7)
Neutrophils Relative %: 64 %
Platelets: 396 10*3/uL (ref 150–400)
RBC: 3.83 MIL/uL — ABNORMAL LOW (ref 4.22–5.81)
RDW: 25.3 % — ABNORMAL HIGH (ref 11.5–15.5)
WBC: 6.8 10*3/uL (ref 4.0–10.5)
nRBC: 0 % (ref 0.0–0.2)

## 2020-04-08 LAB — MAGNESIUM: Magnesium: 2.2 mg/dL (ref 1.7–2.4)

## 2020-04-08 LAB — PHOSPHORUS: Phosphorus: 3.4 mg/dL (ref 2.5–4.6)

## 2020-04-08 MED ORDER — ACETAZOLAMIDE 250 MG PO TABS
250.0000 mg | ORAL_TABLET | Freq: Two times a day (BID) | ORAL | Status: AC
  Administered 2020-04-08 (×2): 250 mg via ORAL
  Filled 2020-04-08 (×2): qty 1

## 2020-04-08 MED ORDER — METOLAZONE 2.5 MG PO TABS
2.5000 mg | ORAL_TABLET | Freq: Once | ORAL | Status: AC
  Administered 2020-04-08: 2.5 mg via ORAL
  Filled 2020-04-08: qty 1

## 2020-04-08 MED ORDER — POTASSIUM CHLORIDE CRYS ER 20 MEQ PO TBCR
40.0000 meq | EXTENDED_RELEASE_TABLET | Freq: Once | ORAL | Status: AC
  Administered 2020-04-08: 40 meq via ORAL
  Filled 2020-04-08: qty 2

## 2020-04-08 NOTE — Care Management Important Message (Signed)
Important Message  Patient Details  Name: Raymond Pratt MRN: [id number] Date of Birth: [birth year]   Medicare Important Message Given:  Yes     Orbie Pyo 04/08/2020, 2:29 PM

## 2020-04-08 NOTE — Progress Notes (Signed)
Pt wore CPAP for about an hour but then requested to switch to nasal cannula.

## 2020-04-08 NOTE — Progress Notes (Signed)
Advanced Heart Failure Rounding Note   Subjective:    Milrinone 0.125 started 1/29 for co-ox 48%. Increased to 0.375 yesterday. Remains in AF with RVR depsite IV amio gtt.   Diuresing well on lasix gtt but also has high oral intake (almost 3L)with eight down 5 pounds overnight. (25 pounds total)  Labs pending    Objective:   Weight Range:  Vital Signs:   Temp:  [97.8 F (36.6 C)-98.6 F (37 C)] 98.6 F (37 C) (02/01 0043) Pulse Rate:  [72-121] 72 (02/01 0043) Resp:  [18-23] 23 (02/01 0043) BP: (116-146)/(76-95) 116/76 (02/01 0043) SpO2:  [95 %-97 %] 95 % (02/01 0043) Weight:  [139.2 kg] 139.2 kg (02/01 0332) Last BM Date: 04/07/20  Weight change: Filed Weights   04/06/20 0100 04/07/20 0345 04/08/20 0332  Weight: (!) 145.7 kg (!) 141.4 kg (!) 139.2 kg    Intake/Output:   Intake/Output Summary (Last 24 hours) at 04/08/2020 0434 Last data filed at 04/08/2020 0000 Gross per 24 hour  Intake 2824.25 ml  Output 4005 ml  Net -1180.75 ml     PHYSICAL EXAM:  General:  Obese, chronically ill appearing WM sitting up in chair. No respiratory difficulty HEENT: normal Neck: supple. JVP to jaw Carotids 2+ bilat; no bruits. No lymphadenopathy or thryomegaly appreciated. Cor: PMI nondisplaced. Irregular tachy Lungs: clear Abdomen: obese soft, nontender, + distended. No hepatosplenomegaly. No bruits or masses. Good bowel sounds. Extremities: no cyanosis, clubbing, rash, 2-3+ edema + wounds Neuro: alert & orientedx3, cranial nerves grossly intact. moves all 4 extremities w/o difficulty. Affect pleasant   Telemetry: AF 110-120s Personally reviewed  Labs: Basic Metabolic Panel: Recent Labs  Lab 03/25/2020 2019 04/04/20 1449 04/05/20 0441 04/06/20 0454 04/07/20 0700  NA 139 139 138 137 129*  K 5.3* 4.6 4.4 3.7 3.4*  CL 104 103 102 95* 85*  CO2 '25 28 28 30 29  '$ GLUCOSE 238* 150* 69* 96 495*  BUN 35* 40* 43* 43* 42*  CREATININE 1.38* 1.75* 1.60* 1.51* 1.79*  CALCIUM 8.6*  8.7* 8.5* 9.0 8.0*  MG  --  2.3 2.3 2.1 1.8  PHOS  --  4.1 4.5 4.0 3.3    Liver Function Tests: Recent Labs  Lab 03/22/2020 2019 04/04/20 1449 04/05/20 0441 04/06/20 0454 04/07/20 0700  AST 35 '31 25 25 16  '$ ALT 50* 48* 46* 44 32  ALKPHOS 92 81 82 82 78  BILITOT 0.6 0.7 0.5 1.0 0.8  PROT 6.6 6.7 6.4* 6.8 6.1*  ALBUMIN 3.1* 3.1* 3.1* 3.3* 2.9*   No results for input(s): LIPASE, AMYLASE in the last 168 hours. No results for input(s): AMMONIA in the last 168 hours.  CBC: Recent Labs  Lab 03/30/2020 2019 04/04/20 1449 04/05/20 0441 04/06/20 0454 04/07/20 0819  WBC 6.3 8.6 7.3 7.2 6.9  NEUTROABS 5.9  --  4.4 4.6 4.6  HGB 9.2* 9.4* 9.0* 9.3* 9.1*  HCT 33.6* 34.6* 31.8* 33.4* 32.0*  MCV 84.2 84.0 82.8 82.1 81.0  PLT 319 367 365 398 378    Cardiac Enzymes: No results for input(s): CKTOTAL, CKMB, CKMBINDEX, TROPONINI in the last 168 hours.  BNP: BNP (last 3 results) Recent Labs    12/10/19 0057 02/13/20 0807 03/22/2020 2019  BNP 154.7* 111.6* 195.0*    ProBNP (last 3 results) No results for input(s): PROBNP in the last 8760 hours.    Other results:  Imaging: DG CHEST PORT 1 VIEW  Result Date: 04/07/2020 CLINICAL DATA:  Shortness of breath EXAM: PORTABLE CHEST 1  VIEW COMPARISON:  Yesterday FINDINGS: Marked cardiomegaly. Unchanged vascular pedicle widening with congested appearance of central vessels. Mildly improved lung volumes. No edema or air bronchogram. Left PICC with tip at the upper right atrium. IMPRESSION: 1. Improved lung volumes. 2. Cardiomegaly and vascular congestion. Electronically Signed   By: Monte Fantasia M.D.   On: 04/07/2020 07:27   DG CHEST PORT 1 VIEW  Result Date: 04/06/2020 CLINICAL DATA:  Diastolic and systolic heart failure EXAM: PORTABLE CHEST 1 VIEW COMPARISON:  None. FINDINGS: Lordotic projection. Stable enlarged cardiac silhouette. Low lung volumes. No upper lobe edema or pneumothorax. PICC line unchanged IMPRESSION: Cardiomegaly and low  lung volumes. Electronically Signed   By: Suzy Bouchard M.D.   On: 04/06/2020 07:24     Medications:     Scheduled Medications: . apixaban  5 mg Oral BID  . atorvastatin  40 mg Oral Daily  . Chlorhexidine Gluconate Cloth  6 each Topical Daily  . insulin aspart  0-20 Units Subcutaneous TID WC  . insulin glargine  22 Units Subcutaneous BID  . potassium chloride SA  40 mEq Oral BID  . sodium chloride flush  3 mL Intravenous Q12H  . spironolactone  12.5 mg Oral Daily    Infusions: . amiodarone 30 mg/hr (04/07/20 1702)  . furosemide (LASIX) 200 mg in dextrose 5% 100 mL ('2mg'$ /mL) infusion 20 mg/hr (04/07/20 2350)  . milrinone 0.375 mcg/kg/min (04/07/20 2343)    PRN Medications: acetaminophen **OR** acetaminophen, [COMPLETED] diphenhydrAMINE **FOLLOWED BY** diphenhydrAMINE, hydrocortisone cream, hydrOXYzine, ondansetron (ZOFRAN) IV, polyethylene glycol, sodium chloride flush   Assessment/Plan:   1.  Acute on chronic systolic heart failure - Echo (11/2018) EF 25-30%, trace MR, trivial TR, moderately reduced RSVF.   - Cath (12/2018) RA 10, RV 55/8, PA 54/15 (27), PCW = 16, Fick CO/CI = 7.0/2.8 - Echo (04/04/2020) EF 45-50%, mild LVH, hypokinesis LV, dilated LA, dilated RV/RA, mod MR, mod-severe TR, RVSP > 60 mmHg.  - Milrinone started on 1/29 for co-ox 48%. Increased to 0.375 on 1/31 - Labs pending - Likely triggered by AF, monitor closely while on inotropes  - Good diuresis Continue lasix gtt at 20 mg/hr and metolazone. Can add diamox as needed - Holding Black and dig for now.  - Continue Spiro 12.5 mg   2. Persistent AF - has been on amio at home - suspect has been in AF for several weeks. ECG in 12/21 was NSR - continue IV amio while on milrinone - TFTs not too bad - continue Eliquis - TEE DC-CV later this week when HF improved  3. Chronic respiratory failure/OSA - Continue with CPAP at night for OSA  4. CKD 3b - Baseline Cr 1.5-1.7 - SCr up to 1.8 yesterday.  Await labs today - continue milrinone support - start [place] prior to d/c  5. Anemia/Hx of GIB - Hgb stable at 9.1 - EGD 12/2019 nonbleeding duodenal ulcer with gastritis - Continue to monitor  6. Venous stasis wounds L tib/fib, L heel & Sacral wound - Wound care consulted. Primary team managing   7. T2DM - A1c 12.4% recent hospitalization - Per primary team - Add SGLT2i prior to d/c  8. Morbidly obese Body mass index is 48.06 kg/m.  9. Lung nodule - Will need outpatient imaging in 3 months, 9 mm CT (03/2020)  10. Hypokalemia/ Hypomagnesemia - Supp K and mag as needed  K> 4.0 Mg > 2.0   11. Hypervolemic Hyponatremia - Na 129 yesterday - continue diuresis - fluid restrict  -  follow daily BMET    Length of Stay: 5   [name] MD 04/08/2020, 4:34 AM  Advanced Heart Failure Team Pager [phone] (M-F; [place])  Please contact [facility] for night-coverage after hours (4p -7a ) and weekends on [email or web address]

## 2020-04-08 NOTE — Plan of Care (Signed)

## 2020-04-08 NOTE — Progress Notes (Signed)
PROGRESS NOTE    Raymond Pratt  [id number] DOB: November 04, 1950 DOA: 04/07/2020 PCP: Shon Baton, MD  Brief Narrative:  HPI per [name] on 03/27/2020 Raymond Pratt is a 70 y.o. male with medical history significant of combined systolic and diastolic heart failure, atrial fibrillation, anemia, cellulitis, GI bleed, hypertension, OSA on CPAP, diabetes, nonobstructive CAD who presents with worsening shortness of breath.             Patient states he has had worsening shortness of breath for the past day.  He also reports edema and states that he checked his weight and noted it has gone up by 30 to 50 pounds.  He states he has been compliant with diet and fluid intake.  He says his warfarin, digoxin, spironolactone were stopped during previous admissions. He states his symptoms are similar to his previous CHF exacerbations.  He typically uses 2 L supplemental O2 at home and had to increase it to 3 L last night and is on 3-4L in the ED.  He also reports per his home scale his weight has increased from 280 pounds to 330 pounds. He denies fevers, chills, chest pain, abdominal pain, constipation, diarrhea, nausea, vomiting.  ED Course: Vital signs significant for 4 L required to maintain saturations, heart rate in the one hundreds to one thirties, respirations in the teens to twenties.  Lab work-up showed BMP with potassium of 5.3, creatinine of 1.38 which is stable, glucose 230.  LFTs showed calcium of 8.6 which corrects when considering albumin of 3.1, ALT 50.  CBC showed hemoglobin of 9.2 which is stable.  PT/INR were normal.  BNP only mildly elevated at 195 the setting of obesity.  Troponin initially 20 with repeat pending.  Covid antigen negative Covid PCR screen pending.  Digoxin level low at less than 0.2.  Chest x-ray showed cardiomegaly, pulmonary vascular congestion and perihilar edema.  Patient started on Lasix cardiology was consulted and will see the patient in the  morning.  **Interim History  Cardiology was consulted and started diuresis on the patient and started anticoagulation.  They are placing a PICC line to monitor his volume status and coags.  Further care per cardiology and his renal function went up slightly.  Will need to continue monitor him extremely carefully given comorbidities.  Patient's coox was 52.9 this morning and remained in A. fib with heart rates in the 110s to 120s..  Cardiology has now changed him to a Lasix drip and have started the patient on milrinone.  They are holding his Entresto, digoxin but have resumed spironolactone for now.  Weight is down a little bit but continues to Diurese and down approximately 12.6 Liters. Cr bumped yesterday bit is now improving to 39/1.60.   Assessment & Plan:   Principal Problem:   Acute exacerbation of CHF (congestive heart failure) (HCC) Active Problems:   Type 2 diabetes mellitus with hyperlipidemia (HCC)   OSA on CPAP   Anticoagulated on Coumadin   HTN (hypertension)   A-fib (HCC)   Anemia   Atrial fibrillation with RVR (HCC)   Pressure injury of skin   Acute respiratory failure with hypoxia (HCC)  Acute on chronic systolic CHF exacerbation > Last EF in 2020 was 20-30%, with hypokinesis. > Also A. fib with RVR which is likely worsening his exacerbation and hemodynamics. > States he is compliant with his medications but has had a gradual weight gain edema and shortness of breath and now has had a total of  50 pound weight gain has had multiple multiple liters diuresed during previous admissions. > States he has had digoxin and spironolactone discontinued during recent admissions the spironolactone is possibly related to elevated potassium levels but unclear. - Cardiology consulted in ED and will see the patient in the morning, appreciate recommendations.  Audiology has evaluated him and patient continues to have marked volume overload in the setting of his recurrent atrial fibrillation.   They have increased his IV Lasix and added metolazone and also placed TED hose.  Cardiology is also starting Eliquis for anticoagulation and recommending a TEE/DCCV once he is diuresed adequately.  They will place a PICC line and follow CVP and coox daily. - Now on a Lasix gtt and Milrinone gtt (was increased from 0.125 -> 0.25 yesterday and will be increased to 0.375 today ) .  His coox showed low output 51.2% this AM and Repeat was 52.9% -Echocardiogram being repeated showed an LVEF of 20 to 25% with global hypokinesis -Daily weights -Strict I/O's; He is -12.6764 L since admission; Weight is down from 329 -> 321 -> 311 -> 306 - Is on Entresto is currently being held by cardiology as he is getting diuresis and holding his Digoxin; Now they will be restarting his Spironolactone to 12.5 mg po BID -CXR yesterday AM showed "Cardiomegaly and low lung volumes." -C/w Lasix gtt, Milrinone gtt  and Further Care per Cardiology and they are going to add Metolazone 5 mg po Daily now  -They are recommending Considering adding Diamox if needed  Atrial fibrillation with RVR > Presented with Elevated HR ranging from 115-140's despite Amiodarone gtt > Dilt drip was avoided due to his low output heart failure but he did respond well to single dose of metoprolol -Digoxin Level was <0.2  -Had Uncontrolled rates and cardiology is to start him on milrinone and Ehrmann checked his thyroid function tests for anemia induced hyperthyroidism - He is now on amiodarone drip per cardiology recommendations - Continued home amiodarone at 200 mg p.o. twice daily but now since his heart rate remains uncontrolled cardiology starting him on amiodarone drip and checking thyroid function tests; TFTS done and showed Elevated TSH at 6.902 but normal T4 of 0.98 -He has been given IV metoprolol tartrate 5 mg x 2 -Cardiology started anticoagulation with apixaban and they are recommending continuing and then recommending a TEE/DCCV this  coming week when his heart failure is improved and fully diuresed and improved from a Heart Failure Standpoint  -Cardiology recommending continuing IV Amio while on a Milrinone gtt  Acquired Thrombophilia -In the setting of his atrial fibrillation -Continue Apixaban 5 mg p.o. twice daily  AKI on CKD stage IIIa -In the setting of his volume overload and diuresis  -Patient's BUNs/creatinine went from 35/1.3 is now 40/1.75 -> 43/1.60 -> 43/1.51 -> 42/1.79 -> 39/1.60 -Baseline creatinine ranged from 1.5-1.7 and he presented with a baseline creatinine better than his baseline -Avoid further nephrotoxic medications, contrast dyes, hypotension and renally dose medications -His Delene Loll is currently being held but his Spironolactone is being added as well as Metolazone 5 mg -Repeat CMP in a.m. and continue to monitor renal function closely.  Diabetes Mellitus Type 2 -Home regimen is 60 units long-acting twice daily -Last hemoglobin A1c was 12.4 and his most recent hospitalization -We will give 30 units long-acting twice daily and SSI with resistant sliding scale AC -CBGs ranging from 141-194  Venous stasis wounds -Patient has stasis changes on lower extremities with wounds on his left lower  extremity -No signs of infection at this time, wounds are managed by home health -We will consult wound care  Nonobstructive CAD -Continue home atorvastatin 40 mg and aspirin 81 mg -Cardiology following   OSA Chronic Respiratory Failure with Hypoxia -SpO2: 96 % O2 Flow Rate (L/min): 5 L/min -Continue home CPAP with Home Setting of [place] with 5 Liters Bleed in -Continue to Monitor Volume Status Carefully  Hyperkalemia -> Hypokalemia  -K 5.3 on admission and is now trended down -We will monitor for now as he will be getting Lasix for CHF exacerbation -K+ now 3.7 and will replete  -Mag level was 2.2 -Continue to Monitor and Trend -Replete with po KCl 40 mEQ x1 -Entresto is currently being  held due to hyperkalemia and renal failure -Repeat CMP in the AM  Hyponatremia -Na+ Went from 137 -> 129 -> 136 -In the setting of Diuresis and Hypervolemic Hyponatremia -Cardiology recommending Fluid Restricting  -Continue to Monitor closely and Trend -Repeat CMP in the AM   Normocytic Anemia -Patient's Hgb/Hct went from 9.2/33.6 -> 9.4/34.6 -> 9.0/31.8 -> 9.3/33.4 ->  9.1/32.0 -> 8.9/31.2 -Check Anemia Panel in the AM -Continue to Monitor for S/Sx of Bleeding; Currently no overt bleeding noted -Repeat CBC in the AM   Pruritis -Likely medication related as T Bili is not elevated (1.1) -C/w Benadryl 25 mg po q6hprn -C/w Hydrocortisone Cream -Will add Hydroxyzine 25 mg po TID  Lung nodule  -Outpatient follow-up in 3 months given that he is 9 mm nodule found on CT scan  Pressure Injuries, poA Pressure Injury 04/04/20 Heel Left Stage 3 -  Full thickness tissue loss. Subcutaneous fat may be visible but bone, tendon or muscle are NOT exposed. (Active)  04/04/20   Location: Heel  Location Orientation: Left  Staging: Stage 3 -  Full thickness tissue loss. Subcutaneous fat may be visible but bone, tendon or muscle are NOT exposed.  Wound Description (Comments):   Present on Admission: Yes     Pressure Injury 04/04/20 Left Stage 3 -  Full thickness tissue loss. Subcutaneous fat may be visible but bone, tendon or muscle are NOT exposed. (Active)  04/04/20   Location:   Location Orientation: Left  Staging: Stage 3 -  Full thickness tissue loss. Subcutaneous fat may be visible but bone, tendon or muscle are NOT exposed.  Wound Description (Comments):   Present on Admission: Yes     Pressure Injury 04/04/20 Buttocks Right Stage 2 -  Partial thickness loss of dermis presenting as a shallow open injury with a red, pink wound bed without slough. (Active)  04/04/20   Location: Buttocks  Location Orientation: Right  Staging: Stage 2 -  Partial thickness loss of dermis presenting as a  shallow open injury with a red, pink wound bed without slough.  Wound Description (Comments):   Present on Admission: Yes  -WOC Nurse Consulted   Morbid Obesity -Complicates overall prognosis and care -Estimated body mass index is 48.03 kg/m as calculated from the following:   Height as of this encounter: '5\' 7"'$  (1.702 m).   Weight as of this encounter: 139.1 kg. -Weight Loss and Dietary Counseling given   DVT prophylaxis: Anticoagulated with Eliquis Code Status: FULL CODE  Family Communication: No family present at bedside Disposition Plan: Pending further cardiac clearance and diuresis back to dry weight; PT/OT recommending Home Health when stable to D/C   Status is: Inpatient  Remains inpatient appropriate because:Unsafe d/c plan, IV treatments appropriate due to intensity of illness or  inability to take PO and Inpatient level of care appropriate due to severity of illness   Dispo: The patient is from: Home              Anticipated d/c is to: TBD              Anticipated d/c date is: 2 days              Patient currently is not medically stable to d/c.   Difficult to place patient No   Consultants:   Cardiology   Procedures:  ECHOCARDIOGRAM IMPRESSIONS    1. Limited study, patient asked the tech to discontinue the test before  it was completed  2. Atrial fibrillation with rapid ventricular response was noted  throughout the study  3. Left ventricular ejection fraction, by estimation, is 20 to 25%. The  left ventricle has severely decreased function. The left ventricle  demonstrates global hypokinesis. There is mild left ventricular  hypertrophy. Left ventricular diastolic function  could not be evaluated.  4. Right ventricular systolic function was not well visualized. The right  ventricular size is not well visualized. There is moderately elevated  pulmonary artery systolic pressure.  5. Left atrial size was mildly dilated.  6. The mitral valve is  abnormal. Trivial mitral valve regurgitation.  7. Tricuspid valve regurgitation is mild to moderate.  8. The aortic valve is tricuspid. Aortic valve regurgitation is not  visualized. Mild to moderate aortic valve sclerosis/calcification is  present, without any evidence of aortic stenosis.   Comparison(s): Changes from prior study are noted. 12/06/2018: LVEF 25-30%,  global hypokinesis.   FINDINGS  Left Ventricle: Left ventricular ejection fraction, by estimation, is 20  to 25%. The left ventricle has severely decreased function. The left  ventricle demonstrates global hypokinesis. The left ventricular internal  cavity size was normal in size. There  is mild left ventricular hypertrophy. Left ventricular diastolic function  could not be evaluated due to atrial fibrillation. Left ventricular  diastolic function could not be evaluated.   Right Ventricle: The right ventricular size is not well visualized. Right  vetricular wall thickness was not well visualized. Right ventricular  systolic function was not well visualized. There is moderately elevated  pulmonary artery systolic pressure. The  tricuspid regurgitant velocity is 3.47 m/s, and with an assumed right  atrial pressure of 8 mmHg, the estimated right ventricular systolic  pressure is 99991111 mmHg.   Left Atrium: Left atrial size was mildly dilated.   Right Atrium: Right atrial size was normal in size.   Pericardium: There is no evidence of pericardial effusion.   Mitral Valve: The mitral valve is abnormal. There is mild thickening of  the mitral valve leaflet(s). Mild to moderate mitral annular  calcification. Trivial mitral valve regurgitation.   Tricuspid Valve: The tricuspid valve is grossly normal. Tricuspid valve  regurgitation is mild to moderate.   Aortic Valve: The aortic valve is tricuspid. Aortic valve regurgitation is  not visualized. Mild to moderate aortic valve sclerosis/calcification is  present, without  any evidence of aortic stenosis.   Pulmonic Valve: The pulmonic valve was grossly normal. Pulmonic valve  regurgitation is trivial.   Aorta: The aortic root and ascending aorta are structurally normal, with  no evidence of dilitation.   IAS/Shunts: No atrial level shunt detected by color flow Doppler.     LEFT VENTRICLE  PLAX 2D  LVIDd:     5.40 cm  LVIDs:     3.20 cm  LV  PW:     1.00 cm  LV IVS:    1.20 cm  LVOT diam:   1.90 cm  LV SV:     35  LV SV Index:  14  LVOT Area:   2.84 cm     LEFT ATRIUM      Index  LA diam:   5.70 cm 2.28 cm/m  LA Vol (A4C): 87.9 ml 35.18 ml/m  AORTIC VALVE  LVOT Vmax:  69.20 cm/s  LVOT Vmean: 52.550 cm/s  LVOT VTI:  0.124 m    AORTA  Ao Root diam: 3.10 cm  Ao Asc diam: 3.00 cm   TRICUSPID VALVE  TR Peak grad:  48.2 mmHg  TR Vmax:    347.00 cm/s    SHUNTS  Systemic VTI: 0.12 m  Systemic Diam: 1.90 cm   Antimicrobials:  Anti-infectives (From admission, onward)   None        Subjective: Seen and examined at bedside and he continues to have some itching and states that he continues to diuresis quite well.  Heart rate still elevated but states he just worked with therapy.  No nausea or vomiting.  Appears somewhat uncomfortable.  No other concerns or complaints at this time.  Objective: Vitals:   04/08/20 0454 04/08/20 0749 04/08/20 0855 04/08/20 1212  BP: 110/84 (!) 119/91  130/77  Pulse: (!) 111   (!) 123  Resp: 20   20  Temp: 98.3 F (36.8 C) 98.6 F (37 C)  97.7 F (36.5 C)  TempSrc: Oral Oral  Oral  SpO2: 94%   96%  Weight:   (!) 139.1 kg   Height:        Intake/Output Summary (Last 24 hours) at 04/08/2020 1621 Last data filed at 04/08/2020 1537 Gross per 24 hour  Intake 1721.95 ml  Output 5735 ml  Net -4013.05 ml   Filed Weights   04/07/20 0345 04/08/20 0332 04/08/20 0855  Weight: (!) 141.4 kg (!) 139.2 kg (!) 139.1 kg   Examination: Physical  Exam:  Constitutional: WN/WD super morbidly obese Caucasian male in Mild Disteress appears uncomfortable laying in the bed  Eyes: Lids and conjunctivae normal, sclerae anicteric  ENMT: External Ears, Nose appear normal. Grossly normal hearing. Mucous membranes are moist. Posterior pharynx clear of any exudate or lesions. Normal dentition.  Neck: Appears normal, supple, no cervical masses, normal ROM, no appreciable thyromegaly; JVD is difficult to assess given his body habitus.  Respiratory: Diminished to auscultation bilaterally with coarse breath sounds and crackles; no wheezing, rales, rhonchi. Normal respiratory effort and patient is not tachypenic. No accessory muscle use. Wearing Supplemental O2 via [place] Cardiovascular: Irregularly Irregular and tachycardic, no murmurs / rubs / gallops. S1 and S2 auscultated. 1-2+ LE Edema Abdomen: Soft, non-tender, Distended 2/2 to body habitus. Bowel sounds positive.  GU: Deferred. Musculoskeletal: No clubbing / cyanosis of digits/nails. No joint deformity upper and lower extremities.  Skin: Has lower extremity venous stasis changes. No induration; Warm and dry.  Neurologic: CN 2-12 grossly intact with no focal deficits. Romberg sign and cerebellar reflexes not assessed.  Psychiatric: Normal judgment and insight. Alert and oriented x 3. Normal mood and appropriate affect.   Data Reviewed: I have personally reviewed following labs and imaging studies  CBC: Recent Labs  Lab 03/30/2020 2019 04/04/20 1449 04/05/20 0441 04/06/20 0454 04/07/20 0819 04/08/20 0519  WBC 6.3 8.6 7.3 7.2 6.9 6.8  NEUTROABS 5.9  --  4.4 4.6 4.6 4.4  HGB 9.2* 9.4* 9.0* 9.3* 9.1*  8.9*  HCT 33.6* 34.6* 31.8* 33.4* 32.0* 31.2*  MCV 84.2 84.0 82.8 82.1 81.0 81.5  PLT 319 367 365 398 378 [id number]   Basic Metabolic Panel: Recent Labs  Lab 04/04/20 1449 04/05/20 0441 04/06/20 0454 04/07/20 0700 04/08/20 0519  NA 139 138 137 129* 136  K 4.6 4.4 3.7 3.4* 3.7  CL 103 102 95* 85*  92*  CO2 '28 28 30 29 '$ 33*  GLUCOSE 150* 69* 96 495* 161*  BUN 40* 43* 43* 42* 39*  CREATININE 1.75* 1.60* 1.51* 1.79* 1.60*  CALCIUM 8.7* 8.5* 9.0 8.0* 8.7*  MG 2.3 2.3 2.1 1.8 2.2  PHOS 4.1 4.5 4.0 3.3 3.4   GFR: Estimated Creatinine Clearance: 59.6 mL/min (A) (by C-G formula based on SCr of 1.6 mg/dL (H)). Liver Function Tests: Recent Labs  Lab 04/04/20 1449 04/05/20 0441 04/06/20 0454 04/07/20 0700 04/08/20 0519  AST '31 25 25 16 16  '$ ALT 48* 46* 44 32 28  ALKPHOS 81 82 82 78 73  BILITOT 0.7 0.5 1.0 0.8 1.1  PROT 6.7 6.4* 6.8 6.1* 6.5  ALBUMIN 3.1* 3.1* 3.3* 2.9* 3.1*   No results for input(s): LIPASE, AMYLASE in the last 168 hours. No results for input(s): AMMONIA in the last 168 hours. Coagulation Profile: Recent Labs  Lab 03/10/2020 2019  INR 1.2   Cardiac Enzymes: No results for input(s): CKTOTAL, CKMB, CKMBINDEX, TROPONINI in the last 168 hours. BNP (last 3 results) No results for input(s): PROBNP in the last 8760 hours. HbA1C: No results for input(s): HGBA1C in the last 72 hours. CBG: Recent Labs  Lab 04/07/20 0613 04/07/20 1205 04/07/20 1638 04/07/20 2051 04/08/20 1208  GLUCAP 119* 145* 179* 153* 141*   Lipid Profile: No results for input(s): CHOL, HDL, LDLCALC, TRIG, CHOLHDL, LDLDIRECT in the last 72 hours. Thyroid Function Tests: Recent Labs    04/06/20 0454  TSH 6.902*  FREET4 0.98  T3FREE 2.5   Anemia Panel: No results for input(s): VITAMINB12, FOLATE, FERRITIN, TIBC, IRON, RETICCTPCT in the last 72 hours. Sepsis Labs: No results for input(s): PROCALCITON, LATICACIDVEN in the last 168 hours.  Recent Results (from the past 240 hour(s))  SARS Coronavirus 2 by RT PCR (hospital order, performed in [facility] hospital lab) Nasopharyngeal Nasopharyngeal Swab     Status: None   Collection Time: 04/02/2020  9:51 PM   Specimen: Nasopharyngeal Swab  Result Value Ref Range Status   SARS Coronavirus 2 NEGATIVE NEGATIVE Final    Comment: Performed at  [facility], [place] [address] [place], [place] [address]     RN Pressure Injury Documentation: Pressure Injury 04/04/20 Heel Left Stage 3 -  Full thickness tissue loss. Subcutaneous fat may be visible but bone, tendon or muscle are NOT exposed. (Active)  04/04/20   Location: Heel  Location Orientation: Left  Staging: Stage 3 -  Full thickness tissue loss. Subcutaneous fat may be visible but bone, tendon or muscle are NOT exposed.  Wound Description (Comments):   Present on Admission: Yes     Pressure Injury 04/04/20 Left Stage 3 -  Full thickness tissue loss. Subcutaneous fat may be visible but bone, tendon or muscle are NOT exposed. (Active)  04/04/20   Location:   Location Orientation: Left  Staging: Stage 3 -  Full thickness tissue loss. Subcutaneous fat may be visible but bone, tendon or muscle are NOT exposed.  Wound Description (Comments):   Present on Admission: Yes     Pressure Injury 04/04/20 Buttocks Right Stage 2 -  Partial thickness loss of dermis presenting as a shallow open injury with a red, pink wound bed without slough. (Active)  04/04/20   Location: Buttocks  Location Orientation: Right  Staging: Stage 2 -  Partial thickness loss of dermis presenting as a shallow open injury with a red, pink wound bed without slough.  Wound Description (Comments):   Present on Admission: Yes     Estimated body mass index is 48.03 kg/m as calculated from the following:   Height as of this encounter: '5\' 7"'$  (1.702 m).   Weight as of this encounter: 139.1 kg.  Malnutrition Type:   Malnutrition Characteristics:   Nutrition Interventions:    Radiology Studies: DG CHEST PORT 1 VIEW  Result Date: 04/07/2020 CLINICAL DATA:  Shortness of breath EXAM: PORTABLE CHEST 1 VIEW COMPARISON:  Yesterday FINDINGS: Marked cardiomegaly. Unchanged vascular pedicle widening with congested appearance of central vessels. Mildly improved lung volumes. No edema or air bronchogram. Left PICC  with tip at the upper right atrium. IMPRESSION: 1. Improved lung volumes. 2. Cardiomegaly and vascular congestion. Electronically Signed   By: Monte Fantasia M.D.   On: 04/07/2020 07:27   Scheduled Meds: . acetaZOLAMIDE  250 mg Oral BID  . apixaban  5 mg Oral BID  . atorvastatin  40 mg Oral Daily  . Chlorhexidine Gluconate Cloth  6 each Topical Daily  . insulin aspart  0-20 Units Subcutaneous TID WC  . insulin glargine  22 Units Subcutaneous BID  . potassium chloride SA  40 mEq Oral BID  . potassium chloride  40 mEq Oral Once  . sodium chloride flush  3 mL Intravenous Q12H  . spironolactone  12.5 mg Oral Daily   Continuous Infusions: . amiodarone 30 mg/hr (04/08/20 1607)  . furosemide (LASIX) 200 mg in dextrose 5% 100 mL ('2mg'$ /mL) infusion 20 mg/hr (04/08/20 1018)  . milrinone 0.375 mcg/kg/min (04/08/20 1214)    LOS: 5 days   Kerney Elbe, DO [facility] PAGER is on [place]  If 7PM-7AM, please contact night-coverage [email or web address]

## 2020-04-08 DEATH — deceased

## 2020-04-09 ENCOUNTER — Inpatient Hospital Stay (HOSPITAL_COMMUNITY): Payer: Medicare HMO

## 2020-04-09 DIAGNOSIS — I4891 Unspecified atrial fibrillation: Secondary | ICD-10-CM | POA: Diagnosis not present

## 2020-04-09 DIAGNOSIS — I5023 Acute on chronic systolic (congestive) heart failure: Secondary | ICD-10-CM | POA: Diagnosis not present

## 2020-04-09 DIAGNOSIS — R0602 Shortness of breath: Secondary | ICD-10-CM | POA: Diagnosis not present

## 2020-04-09 DIAGNOSIS — E1169 Type 2 diabetes mellitus with other specified complication: Secondary | ICD-10-CM | POA: Diagnosis not present

## 2020-04-09 DIAGNOSIS — I10 Essential (primary) hypertension: Secondary | ICD-10-CM | POA: Diagnosis not present

## 2020-04-09 DIAGNOSIS — I4819 Other persistent atrial fibrillation: Secondary | ICD-10-CM | POA: Diagnosis not present

## 2020-04-09 DIAGNOSIS — I517 Cardiomegaly: Secondary | ICD-10-CM | POA: Diagnosis not present

## 2020-04-09 DIAGNOSIS — E785 Hyperlipidemia, unspecified: Secondary | ICD-10-CM | POA: Diagnosis not present

## 2020-04-09 DIAGNOSIS — J811 Chronic pulmonary edema: Secondary | ICD-10-CM | POA: Diagnosis not present

## 2020-04-09 DIAGNOSIS — I480 Paroxysmal atrial fibrillation: Secondary | ICD-10-CM | POA: Diagnosis not present

## 2020-04-09 DIAGNOSIS — J9601 Acute respiratory failure with hypoxia: Secondary | ICD-10-CM | POA: Diagnosis not present

## 2020-04-09 LAB — GLUCOSE, CAPILLARY
Glucose-Capillary: 184 mg/dL — ABNORMAL HIGH (ref 70–99)
Glucose-Capillary: 223 mg/dL — ABNORMAL HIGH (ref 70–99)
Glucose-Capillary: 171 mg/dL — ABNORMAL HIGH (ref 70–99)
Glucose-Capillary: 277 mg/dL — ABNORMAL HIGH (ref 70–99)

## 2020-04-09 LAB — RETICULOCYTES
Immature Retic Fract: 38.8 % — ABNORMAL HIGH (ref 2.3–15.9)
RBC.: 3.64 MIL/uL — ABNORMAL LOW (ref 4.22–5.81)
Retic Count, Absolute: 82.6 10*3/uL (ref 19.0–186.0)
Retic Ct Pct: 2.3 % (ref 0.4–3.1)

## 2020-04-09 LAB — PHOSPHORUS: Phosphorus: 3.7 mg/dL (ref 2.5–4.6)

## 2020-04-09 LAB — VITAMIN B12: Vitamin B-12: 1310 pg/mL — ABNORMAL HIGH (ref 180–914)

## 2020-04-09 LAB — CBC WITH DIFFERENTIAL/PLATELET
Abs Immature Granulocytes: 0.05 10*3/uL (ref 0.00–0.07)
Basophils Absolute: 0.1 10*3/uL (ref 0.0–0.1)
Basophils Relative: 1 %
Eosinophils Absolute: 0.3 10*3/uL (ref 0.0–0.5)
Eosinophils Relative: 5 %
HCT: 30 % — ABNORMAL LOW (ref 39.0–52.0)
Hemoglobin: 8.9 g/dL — ABNORMAL LOW (ref 13.0–17.0)
Immature Granulocytes: 1 %
Lymphocytes Relative: 16 %
Lymphs Abs: 1.1 10*3/uL (ref 0.7–4.0)
MCH: 23.7 pg — ABNORMAL LOW (ref 26.0–34.0)
MCHC: 29.7 g/dL — ABNORMAL LOW (ref 30.0–36.0)
MCV: 80 fL (ref 80.0–100.0)
Monocytes Absolute: 1 10*3/uL (ref 0.1–1.0)
Monocytes Relative: 15 %
Neutro Abs: 4.2 10*3/uL (ref 1.7–7.7)
Neutrophils Relative %: 62 %
Platelets: 437 10*3/uL — ABNORMAL HIGH (ref 150–400)
RBC: 3.75 MIL/uL — ABNORMAL LOW (ref 4.22–5.81)
RDW: 25.7 % — ABNORMAL HIGH (ref 11.5–15.5)
WBC: 6.6 10*3/uL (ref 4.0–10.5)
nRBC: 0 % (ref 0.0–0.2)

## 2020-04-09 LAB — COMPREHENSIVE METABOLIC PANEL
ALT: 25 U/L (ref 0–44)
AST: 14 U/L — ABNORMAL LOW (ref 15–41)
Albumin: 3.1 g/dL — ABNORMAL LOW (ref 3.5–5.0)
Alkaline Phosphatase: 72 U/L (ref 38–126)
Anion gap: 11 (ref 5–15)
BUN: 36 mg/dL — ABNORMAL HIGH (ref 8–23)
CO2: 34 mmol/L — ABNORMAL HIGH (ref 22–32)
Calcium: 8.8 mg/dL — ABNORMAL LOW (ref 8.9–10.3)
Chloride: 89 mmol/L — ABNORMAL LOW (ref 98–111)
Creatinine, Ser: 1.65 mg/dL — ABNORMAL HIGH (ref 0.61–1.24)
GFR, Estimated: 45 mL/min — ABNORMAL LOW (ref >60)
Glucose, Bld: 185 mg/dL — ABNORMAL HIGH (ref 70–99)
Potassium: 3.7 mmol/L (ref 3.5–5.1)
Sodium: 134 mmol/L — ABNORMAL LOW (ref 135–145)
Total Bilirubin: 1.2 mg/dL (ref 0.3–1.2)
Total Protein: 6.8 g/dL (ref 6.5–8.1)

## 2020-04-09 LAB — COOXEMETRY PANEL
Carboxyhemoglobin: 1.7 % — ABNORMAL HIGH (ref 0.5–1.5)
Methemoglobin: 0.9 % (ref 0.0–1.5)
O2 Saturation: 60.2 %
Total hemoglobin: 9 g/dL — ABNORMAL LOW (ref 12.0–16.0)

## 2020-04-09 LAB — IRON AND TIBC
Iron: 30 ug/dL — ABNORMAL LOW (ref 45–182)
Saturation Ratios: 8 % — ABNORMAL LOW (ref 17.9–39.5)
TIBC: 357 ug/dL (ref 250–450)
UIBC: 327 ug/dL

## 2020-04-09 LAB — FOLATE: Folate: 23.2 ng/mL (ref >5.9)

## 2020-04-09 LAB — MAGNESIUM: Magnesium: 2.4 mg/dL (ref 1.7–2.4)

## 2020-04-09 LAB — FERRITIN: Ferritin: 23 ng/mL — ABNORMAL LOW (ref 24–336)

## 2020-04-09 MED ORDER — POTASSIUM CHLORIDE CRYS ER 20 MEQ PO TBCR
60.0000 meq | EXTENDED_RELEASE_TABLET | Freq: Once | ORAL | Status: AC
  Administered 2020-04-09: 60 meq via ORAL
  Filled 2020-04-09: qty 3

## 2020-04-09 MED ORDER — SODIUM CHLORIDE 0.9 % IV SOLN
510.0000 mg | INTRAVENOUS | Status: DC
  Administered 2020-04-09: 510 mg via INTRAVENOUS
  Filled 2020-04-09: qty 17

## 2020-04-09 NOTE — Progress Notes (Signed)
Pt refuses to wear CPAP for the night.  

## 2020-04-09 NOTE — Progress Notes (Signed)
Occupational Therapy Treatment Patient Details Name: Raymond Pratt MRN: [id number] DOB: Mar 19, 1950 Today's Date: 04/09/2020    History of present illness Raymond Pratt is a 70 y.o. male with medical history significant of combined systolic and diastolic heart failure, atrial fibrillation, anemia, cellulitis, GI bleed, hypertension, OSA on CPAP, diabetes, nonobstructive CAD who presents with worsening shortness of breath. Found to have acute exacerbation of CHF.   OT comments  Patient stating he had just walked the halls with PT.  Patient wanting to use the restroom. OT increased his O2 cord length so he can make it to the bathroom.  OT stated he still needed to call for nursing, and that his input/output was being measured.  Patient verbalized understanding.  Patient able to use RW and walk to the bathroom with [facility] and assist with IV and tele monitor. Basic hygiene practiced post bathroom use.  HR up 115 and 2/4 dyspnea reported.  OT will continue to follow in the acute setting in order to maximize functional status for return home.  Deficits to independence listed below.    Follow Up Recommendations  Home health OT;Supervision - Intermittent    Equipment Recommendations  None recommended by OT    Recommendations for Other Services      Precautions / Restrictions Precautions Precautions: Fall Precaution Comments: watch HR, wound on left heel and front or ankle Restrictions Weight Bearing Restrictions: No       Mobility Bed Mobility Overal bed mobility: Needs Assistance Bed Mobility: Rolling;Supine to Sit;Sit to Supine Rolling: Independent   Supine to sit: Modified independent (Device/Increase time);HOB elevated Sit to supine: Modified independent (Device/Increase time);HOB elevated   General bed mobility comments: using side rails  Transfers Overall transfer level: Needs assistance Equipment used: Rolling walker (2 wheeled)                   Balance Overall balance assessment: Mild deficits observed, not formally tested                                         ADL either performed or assessed with clinical judgement   ADL       Grooming: Set up;Supervision/safety;Standing;Wash/dry hands;Wash/dry face Grooming Details (indicate cue type and reason): patient stating he had just been washed by nursing.                 Toilet Transfer: Min Administrator, sports Details (indicate cue type and reason): lines and leads Toileting- Clothing Manipulation and Hygiene: Supervision/safety;Sit to/from stand       Functional mobility during ADLs: Min guard;Rolling walker                         Cognition Arousal/Alertness: Awake/alert Behavior During Therapy: WFL for tasks assessed/performed Overall Cognitive Status: Within Functional Limits for tasks assessed                                                            Pertinent Vitals/ Pain       Faces Pain Scale: Hurts a little bit Pain Location: L foot/ankle Pain Descriptors / Indicators: Tender;Sore Pain Intervention(s): Monitored during session  Frequency  Min 2X/week        Progress Toward Goals  OT Goals(current goals can now be found in the care plan section)  Progress towards OT goals: Progressing toward goals  Acute Rehab OT Goals Patient Stated Goal: I'd like to be able to walk to the bathroom OT Goal Formulation: With patient Time For Goal Achievement: 04/15/2020 Potential to Achieve Goals: Good  Plan Discharge plan remains appropriate    Co-evaluation                 AM-PAC OT "6 Clicks" Daily Activity     Outcome Measure   Help from another person eating meals?: None Help from another person taking care of personal grooming?: A Little Help from another person  toileting, which includes using toliet, bedpan, or urinal?: A Little Help from another person bathing (including washing, rinsing, drying)?: A Lot Help from another person to put on and taking off regular upper body clothing?: A Little Help from another person to put on and taking off regular lower body clothing?: A Lot 6 Click Score: 17    End of Session Equipment Utilized During Treatment: Rolling walker;Oxygen  OT Visit Diagnosis: Unsteadiness on feet (R26.81);Muscle weakness (generalized) (M62.81);Pain Pain - Right/Left: Left Pain - part of body: Ankle and joints of foot   Activity Tolerance Patient tolerated treatment well   Patient Left in bed;with call bell/phone within reach   Nurse Communication Other (comment) (O2 cord length increased by one length and O2 bumped .5L to 3.5L)        Time: [id number] OT Time Calculation (min): 12 min  Charges: OT General Charges $OT Visit: 1 Visit OT Treatments $Self Care/Home Management : 8-22 mins  04/09/2020  Rich, OTR/L  Acute Rehabilitation Services  Office:  [place] 04/09/2020, 2:24 PM

## 2020-04-09 NOTE — Progress Notes (Addendum)
Advanced Heart Failure Rounding Note   Subjective:    Milrinone 0.125 started 1/29 for co-ox 48%.   Milrinone has been increased to 0.375 mcg. CO-OX 60%   Remains in AF with RVR depsite IV amio gtt.   Remains on lasix drip 20 mg per hour. Brisk diuresis negative 3.6    Having ongoing leg pain. Frustrated . Says he didn't sleep well.   Objective:   Weight Range:  Vital Signs:   Temp:  [97.5 F (36.4 C)-97.7 F (36.5 C)] 97.7 F (36.5 C) (02/02 0315) Pulse Rate:  [123-128] 124 (02/02 0315) Resp:  [18-22] 18 (02/02 0315) BP: (102-130)/(68-85) 123/85 (02/02 0315) SpO2:  [91 %-97 %] 91 % (02/02 0315) Weight:  [135.9 kg-139.1 kg] 135.9 kg (02/02 0014) Last BM Date: 04/08/20  Weight change: Filed Weights   04/08/20 0332 04/08/20 0855 04/09/20 0014  Weight: (!) 139.2 kg (!) 139.1 kg 135.9 kg    Intake/Output:   Intake/Output Summary (Last 24 hours) at 04/09/2020 0845 Last data filed at 04/09/2020 0743 Gross per 24 hour  Intake 2430.2 ml  Output 6150 ml  Net -3719.8 ml    CVP 11-12  PHYSICAL EXAM:  General:  No resp difficulty HEENT: normal Neck: supple. JVP 11-12 . Carotids 2+ bilat; no bruits. No lymphadenopathy or thryomegaly appreciated. Cor: PMI nondisplaced. Irregular rate & rhythm. No rubs, gallops or murmurs. Lungs: clear Abdomen: soft, nontender, nondistended. No hepatosplenomegaly. No bruits or masses. Good bowel sounds. Extremities: no cyanosis, clubbing, rash, R and LLE dressings 1-2+ edema Neuro: alert & orientedx3, cranial nerves grossly intact. moves all 4 extremities w/o difficulty. Affect pleasant  Telemetry:  Afib 110-120s personally reviewed.   Labs: Basic Metabolic Panel: Recent Labs  Lab 04/05/20 0441 04/06/20 0454 04/07/20 0700 04/08/20 0519 04/09/20 0509  NA 138 137 129* 136 134*  K 4.4 3.7 3.4* 3.7 3.7  CL 102 95* 85* 92* 89*  CO2 '28 30 29 '$ 33* 34*  GLUCOSE 69* 96 495* 161* 185*  BUN 43* 43* 42* 39* 36*  CREATININE 1.60* 1.51*  1.79* 1.60* 1.65*  CALCIUM 8.5* 9.0 8.0* 8.7* 8.8*  MG 2.3 2.1 1.8 2.2 2.4  PHOS 4.5 4.0 3.3 3.4 3.7    Liver Function Tests: Recent Labs  Lab 04/05/20 0441 04/06/20 0454 04/07/20 0700 04/08/20 0519 04/09/20 0509  AST '25 25 16 16 '$ 14*  ALT 46* 44 32 28 25  ALKPHOS 82 82 78 73 72  BILITOT 0.5 1.0 0.8 1.1 1.2  PROT 6.4* 6.8 6.1* 6.5 6.8  ALBUMIN 3.1* 3.3* 2.9* 3.1* 3.1*   No results for input(s): LIPASE, AMYLASE in the last 168 hours. No results for input(s): AMMONIA in the last 168 hours.  CBC: Recent Labs  Lab 04/05/20 0441 04/06/20 0454 04/07/20 0819 04/08/20 0519 04/09/20 0509  WBC 7.3 7.2 6.9 6.8 6.6  NEUTROABS 4.4 4.6 4.6 4.4 4.2  HGB 9.0* 9.3* 9.1* 8.9* 8.9*  HCT 31.8* 33.4* 32.0* 31.2* 30.0*  MCV 82.8 82.1 81.0 81.5 80.0  PLT 365 398 378 396 437*    Cardiac Enzymes: No results for input(s): CKTOTAL, CKMB, CKMBINDEX, TROPONINI in the last 168 hours.  BNP: BNP (last 3 results) Recent Labs    12/10/19 0057 02/13/20 0807 04/02/2020 2019  BNP 154.7* 111.6* 195.0*    ProBNP (last 3 results) No results for input(s): PROBNP in the last 8760 hours.    Other results:  Imaging: DG CHEST PORT 1 VIEW  Result Date: 04/09/2020 CLINICAL DATA:  Shortness of  breath. EXAM: PORTABLE CHEST 1 VIEW COMPARISON:  04/07/2020.  12/05/2018. FINDINGS: PICC line noted with tip over SVC. Cardiomegaly. Mild pulmonary venous congestion. No focal infiltrates. No pleural effusion or pneumothorax. Old left rib fractures again noted. IMPRESSION: 1. PICC line noted with tip over SVC. 2. Stable cardiomegaly. Mild pulmonary venous congestion. No focal infiltrate. Electronically Signed   By: Marcello Moores  Register   On: 04/09/2020 05:33     Medications:     Scheduled Medications: . apixaban  5 mg Oral BID  . atorvastatin  40 mg Oral Daily  . Chlorhexidine Gluconate Cloth  6 each Topical Daily  . insulin aspart  0-20 Units Subcutaneous TID WC  . insulin glargine  22 Units Subcutaneous  BID  . potassium chloride SA  40 mEq Oral BID  . sodium chloride flush  3 mL Intravenous Q12H  . spironolactone  12.5 mg Oral Daily    Infusions: . amiodarone 30 mg/hr (04/09/20 0426)  . furosemide (LASIX) 200 mg in dextrose 5% 100 mL ('2mg'$ /mL) infusion 20 mg/hr (04/09/20 0456)  . milrinone 0.375 mcg/kg/min (04/09/20 0614)    PRN Medications: acetaminophen **OR** acetaminophen, [COMPLETED] diphenhydrAMINE **FOLLOWED BY** diphenhydrAMINE, hydrocortisone cream, hydrOXYzine, ondansetron (ZOFRAN) IV, polyethylene glycol, sodium chloride flush   Assessment/Plan:   1.  Acute on chronic systolic heart failure - Echo (11/2018) EF 25-30%, trace MR, trivial TR, moderately reduced RSVF.   - Cath (12/2018) RA 10, RV 55/8, PA 54/15 (27), PCW = 16, Fick CO/CI = 7.0/2.8 - Echo (04/04/2020) EF 45-50%, mild LVH, hypokinesis LV, dilated LA, dilated RV/RA, mod MR, mod-severe TR, RVSP > 60 mmHg.  - Milrinone started on 1/29 for co-ox 48%. Increased to 0.375 on 1/31. CO-OX 60% CVP 11-12 . Continue lasix drip at 20 mg per hour.  -- Likely triggered by AF, monitor closely while on inotropes  -  Continue lasix gtt at 20 mg/hr and metolazone. Continue diamox 250 mg twice a day.  -  Holding Rennert and dig for now.  - Continue Spiro 12.5 mg   2. Persistent AF - has been on amio at home - suspect has been in AF for several weeks. ECG in 12/21 was NSR - continue IV amio while on milrinone - TFTs not too bad - continue Eliquis - Set up TEE DC-CV   3. Chronic respiratory failure/OSA - Continue with CPAP at night for OSA  4. CKD 3b - Baseline Cr 1.5-1.7 - SCr  1.65.  -  continue milrinone support - start Farxiga prior to d/c  5. Anemia/Hx of GIB - Hgb stable  8.9  - EGD 12/2019 nonbleeding duodenal ulcer with gastritis - Continue to monitor  6. Venous stasis wounds L tib/fib, L heel & Sacral wound - Wound care consulted. Primary team managing   7. T2DM - A1c 12.4% recent hospitalization -  Per primary team - Add SGLT2i prior to d/c  8. Morbidly obese Body mass index is 46.91 kg/m.  9. Lung nodule - Will need outpatient imaging in 3 months, 9 mm CT (03/2020)  10. Hypokalemia/ Hypomagnesemia - Supp K and mag as needed  K> 4.0 Mg > 2.0   11. Hypervolemic Hyponatremia - Na 134  - continue diuresis - fluid restrict   PT following.    Length of Stay: 6   Amy Clegg MD 04/09/2020, 8:45 AM  Advanced Heart Failure Team Pager [phone] (M-F; [place])  Please contact [facility] for night-coverage after hours (4p -7a ) and weekends on [email or web address]  Patient seen and examined with the above-signed Advanced Practice Provider and/or Housestaff. I personally reviewed laboratory data, imaging studies and relevant notes. I independently examined the patient and formulated the important aspects of the plan. I have edited the note to reflect any of my changes or salient points. I have personally discussed the plan with the patient and/or family.  Remains on milrinone 0.375 and lasix gtt at 20. Weight down 31 pounds. Still volume overloaded. Co-ox 60% Still in AF with RVR on amio gtt.   General:  Obese male sitting up in bed  No resp difficulty HEENT: normal Neck: supple. JVP to jaw Carotids 2+ bilat; no bruits. No lymphadenopathy or thryomegaly appreciated. Cor: PMI nondisplaced. Regular rate & rhythm. No rubs, gallops or murmurs. Lungs: clear Abdomen: obese soft, nontender, + distended. No hepatosplenomegaly. No bruits or masses. Good bowel sounds. Extremities: no cyanosis, clubbing, rash, 2+  edema Neuro: alert & orientedx3, cranial nerves grossly intact. moves all 4 extremities w/o difficulty. Affect pleasant  Diuresing well on milrinone. Remains volume overloaded. Continue IV diuresis and milrinone. Will need TEE and DCCV likely Friday or Monday once fluid status improved.   [name], MD  4:55 PM

## 2020-04-09 NOTE — Progress Notes (Signed)
Physical Therapy Treatment Patient Details Name: Raymond Pratt MRN: [id number] DOB: December 08, 1950 Today's Date: 04/09/2020    History of Present Illness Raymond Pratt is a 70 y.o. male with medical history significant of combined systolic and diastolic heart failure, atrial fibrillation, anemia, cellulitis, GI bleed, hypertension, OSA on CPAP, diabetes, nonobstructive CAD who presents with worsening shortness of breath. Found to have acute exacerbation of CHF.    PT Comments    Pt progressing well towards his physical therapy goals, demonstrating improved activity tolerance and ambulation distance. Pt able to participate in seated warm up exercises and ambulating x 160 feet with a walker at a min guard assist level. SpO2 93-95% on 2L O2, HR 123-138 afib. D/c plan remains appropriate.     Follow Up Recommendations  Home health PT     Equipment Recommendations  None recommended by PT    Recommendations for Other Services       Precautions / Restrictions Precautions Precautions: Fall Precaution Comments: watch HR, wound on left heel and front or ankle Restrictions Weight Bearing Restrictions: No    Mobility  Bed Mobility Overal bed mobility: Modified Independent Bed Mobility: Rolling;Supine to Sit;Sit to Supine Rolling: Independent   Supine to sit: Modified independent (Device/Increase time);HOB elevated Sit to supine: Modified independent (Device/Increase time);HOB elevated   General bed mobility comments: using side rails  Transfers Overall transfer level: Needs assistance Equipment used: Rolling walker (2 wheeled) Transfers: Sit to/from Stand Sit to Stand: Min guard         General transfer comment: Use of momentum to power up  Ambulation/Gait Ambulation/Gait assistance: Min guard Gait Distance (Feet): 160 Feet Assistive device: Rolling walker (2 wheeled) Gait Pattern/deviations: Step-through pattern;Decreased stride length Gait velocity:  decreased   General Gait Details: Cues for activity pacing, min guard for safety   Stairs             Wheelchair Mobility    Modified Rankin (Stroke Patients Only)       Balance Overall balance assessment: Mild deficits observed, not formally tested                                          Cognition Arousal/Alertness: Awake/alert Behavior During Therapy: WFL for tasks assessed/performed Overall Cognitive Status: Within Functional Limits for tasks assessed                                        Exercises General Exercises - Lower Extremity Long Arc Quad: Both;10 reps;Seated Toe Raises: Both;10 reps;Seated Heel Raises: Both;10 reps;Seated    General Comments        Pertinent Vitals/Pain Pain Assessment: Faces Faces Pain Scale: Hurts a little bit Pain Location: L foot/ankle Pain Descriptors / Indicators: Tender;Sore Pain Intervention(s): Limited activity within patient's tolerance;Monitored during session;Patient requesting pain meds-RN notified    Home Living                      Prior Function            PT Goals (current goals can now be found in the care plan section) Acute Rehab PT Goals Patient Stated Goal: I'd like to be able to walk to the bathroom Potential to Achieve Goals: Good Progress towards PT goals: Progressing toward goals  Frequency    Min 3X/week      PT Plan Current plan remains appropriate    Co-evaluation              AM-PAC PT "6 Clicks" Mobility   Outcome Measure  Help needed turning from your back to your side while in a flat bed without using bedrails?: None Help needed moving from lying on your back to sitting on the side of a flat bed without using bedrails?: None Help needed moving to and from a bed to a chair (including a wheelchair)?: None Help needed standing up from a chair using your arms (e.g., wheelchair or bedside chair)?: None Help needed to walk in  hospital room?: A Little Help needed climbing 3-5 steps with a railing? : A Little 6 Click Score: 22    End of Session Equipment Utilized During Treatment: Oxygen Activity Tolerance: Patient tolerated treatment well Patient left: in bed;with call bell/phone within reach Nurse Communication: Mobility status PT Visit Diagnosis: Difficulty in walking, not elsewhere classified (R26.2)     Time: [id number] PT Time Calculation (min) (ACUTE ONLY): 33 min  Charges:  $Therapeutic Exercise: 8-22 mins $Therapeutic Activity: 8-22 mins                     Wyona Almas, PT, DPT Acute Rehabilitation Services Pager [phone] Office [phone]    Deno Etienne 04/09/2020, 3:56 PM

## 2020-04-09 NOTE — Progress Notes (Addendum)
PROGRESS NOTE    Raymond Pratt  [id number] DOB: February 13, 1951 DOA: 03/08/2020 PCP: Shon Baton, MD   Brief Narrative:  Patient is 70 year old male with past medical history of combined systolic and diastolic CHF, A. fib, anemia, cellulitis, GI bleed, hypertension, OSA on CPAP, diabetes, coronary artery disease present to emergency department with shortness of breath.  ED course: Patient was noted to be hypoxic requiring 4 L of oxygen, tachycardic, tachypneic, potassium of 5.3, creatinine: 1.38, BNP: 195, troponin: 20, chest x-ray showed cardiomegaly, pulmonary vascular congestion and perihilar edema.  Patient started on Lasix and cardiology was consulted.  Assessment & Plan:   Acute on chronic systolic CHF: -Echo showed ejection fraction of 45 to 50%, mild LVH, hypokinesis, dilated RV/RA, moderate MR, moderate to severe TR -Continue milrinone, Lasix gtt., Aldactone -Continue to hold Entresto and digoxin for now -Strict INO's and daily weight and monitor signs of fluid overload -Appreciate cardiology's recommendation  Persistent A. Fib with RVR: -Continue with IV amiodarone, Eliquis -Cardiology is to plan TEE, DC-CV  AKI on CKD stage III A: Continue with diuresis.  Continue to monitor renal function closely.  Anemia of chronic disease/history of GI bleed: -H&H is currently stable.  Continue to monitor.  - transfuse if hemoglobin less than 7  Uncontrolled type 2 diabetes mellitus: A1c 12.4%.  Continue Lantus,  sliding scale insulin Monitor blood sugar closely  Chronic bilateral venous stasis: -Continue with wound care-no signs of infection.  Hypokalemia/hypomagnesemia: Replenished  Morbid obesity with BMI of 46: -Diet modification/exercise and weight loss recommended.  OSA: CPAP at nighttime  Lung nodule: Noted on CT scan.  Outpatient follow-up in 3 months.  Pressure injury: POA -Stage III left heel and right buttocks -Appreciate wound care  recommendations  DVT prophylaxis: Eliquis Code Status: Full code Family Communication:  None present at bedside.  Plan of care discussed with patient in length and he verbalized understanding and agreed with it. Disposition Plan: To be determined  Consultants:   Cardiology  Procedures:   Echo  Antimicrobials:   None  Status is: Inpatient   Dispo: The patient is from: Home              Anticipated d/c is to: Home              Anticipated d/c date is: 3 days              Patient currently is not medically stable to d/c.   Difficult to place patient no         Subjective: Patient seen and examined.  Tells me that he could not sleep last night and he is frustrated about it.  Continues to have bilateral leg pain however denies orthopnea or PND or worsening shortness of breath or leg swelling.  Remained afebrile.  No acute events overnight Objective: Vitals:   04/08/20 1929 04/09/20 0013 04/09/20 0014 04/09/20 0315  BP: 114/80 102/68  123/85  Pulse: (!) 128   (!) 124  Resp: 20  (!) 22 18  Temp: 97.6 F (36.4 C)  (!) 97.5 F (36.4 C) 97.7 F (36.5 C)  TempSrc: Oral  Oral Oral  SpO2: 97%  97% 91%  Weight:   135.9 kg   Height:        Intake/Output Summary (Last 24 hours) at 04/09/2020 1453 Last data filed at 04/09/2020 1300 Gross per 24 hour  Intake 2210.2 ml  Output 4975 ml  Net -2764.8 ml   Filed Weights   04/08/20 0332  04/08/20 0855 04/09/20 0014  Weight: (!) 139.2 kg (!) 139.1 kg 135.9 kg    Examination:  General exam: Appears calm and comfortable, morbidly obese, on nasal cannula, appears frustrated Respiratory system: Clear to auscultation. Respiratory effort normal. Cardiovascular system: S1 & S2 heard, RRR. No JVD, murmurs, rubs, gallops or clicks. No pedal edema. Gastrointestinal system: Abdomen is nondistended, soft and nontender. No organomegaly or masses felt. Normal bowel sounds heard. Central nervous system: Alert and oriented. No focal  neurological deficits. Extremities: Symmetric 5 x 5 power. Skin: No rashes, lesions or ulcers Psychiatry: Judgement and insight appear normal. Mood & affect appropriate.    Data Reviewed: I have personally reviewed following labs and imaging studies  CBC: Recent Labs  Lab 04/05/20 0441 04/06/20 0454 04/07/20 0819 04/08/20 0519 04/09/20 0509  WBC 7.3 7.2 6.9 6.8 6.6  NEUTROABS 4.4 4.6 4.6 4.4 4.2  HGB 9.0* 9.3* 9.1* 8.9* 8.9*  HCT 31.8* 33.4* 32.0* 31.2* 30.0*  MCV 82.8 82.1 81.0 81.5 80.0  PLT 365 398 378 396 [id number]*   Basic Metabolic Panel: Recent Labs  Lab 04/05/20 0441 04/06/20 0454 04/07/20 0700 04/08/20 0519 04/09/20 0509  NA 138 137 129* 136 134*  K 4.4 3.7 3.4* 3.7 3.7  CL 102 95* 85* 92* 89*  CO2 '28 30 29 '$ 33* 34*  GLUCOSE 69* 96 495* 161* 185*  BUN 43* 43* 42* 39* 36*  CREATININE 1.60* 1.51* 1.79* 1.60* 1.65*  CALCIUM 8.5* 9.0 8.0* 8.7* 8.8*  MG 2.3 2.1 1.8 2.2 2.4  PHOS 4.5 4.0 3.3 3.4 3.7   GFR: Estimated Creatinine Clearance: 57 mL/min (A) (by C-G formula based on SCr of 1.65 mg/dL (H)). Liver Function Tests: Recent Labs  Lab 04/05/20 0441 04/06/20 0454 04/07/20 0700 04/08/20 0519 04/09/20 0509  AST '25 25 16 16 '$ 14*  ALT 46* 44 32 28 25  ALKPHOS 82 82 78 73 72  BILITOT 0.5 1.0 0.8 1.1 1.2  PROT 6.4* 6.8 6.1* 6.5 6.8  ALBUMIN 3.1* 3.3* 2.9* 3.1* 3.1*   No results for input(s): LIPASE, AMYLASE in the last 168 hours. No results for input(s): AMMONIA in the last 168 hours. Coagulation Profile: Recent Labs  Lab 03/17/2020 2019  INR 1.2   Cardiac Enzymes: No results for input(s): CKTOTAL, CKMB, CKMBINDEX, TROPONINI in the last 168 hours. BNP (last 3 results) No results for input(s): PROBNP in the last 8760 hours. HbA1C: No results for input(s): HGBA1C in the last 72 hours. CBG: Recent Labs  Lab 04/08/20 1208 04/08/20 1619 04/08/20 2115 04/09/20 0605 04/09/20 1209  GLUCAP 141* 194* 289* 184* 223*   Lipid Profile: No results for  input(s): CHOL, HDL, LDLCALC, TRIG, CHOLHDL, LDLDIRECT in the last 72 hours. Thyroid Function Tests: No results for input(s): TSH, T4TOTAL, FREET4, T3FREE, THYROIDAB in the last 72 hours. Anemia Panel: Recent Labs    04/09/20 0509 04/09/20 0554 04/09/20 0555  VITAMINB12 1,310*  --   --   FOLATE  --  23.2  --   FERRITIN 23*  --   --   TIBC 357  --   --   IRON 30*  --   --   RETICCTPCT  --   --  2.3   Sepsis Labs: No results for input(s): PROCALCITON, LATICACIDVEN in the last 168 hours.  Recent Results (from the past 240 hour(s))  SARS Coronavirus 2 by RT PCR (hospital order, performed in [facility] hospital lab) Nasopharyngeal Nasopharyngeal Swab     Status: None   Collection Time: 03/09/2020  9:51 PM   Specimen: Nasopharyngeal Swab  Result Value Ref Range Status   SARS Coronavirus 2 NEGATIVE NEGATIVE Final    Comment: Performed at [facility], [place] [address] [place], [place] [address]      Radiology Studies: DG CHEST PORT 1 VIEW  Result Date: 04/09/2020 CLINICAL DATA:  Shortness of breath. EXAM: PORTABLE CHEST 1 VIEW COMPARISON:  04/07/2020.  12/05/2018. FINDINGS: PICC line noted with tip over SVC. Cardiomegaly. Mild pulmonary venous congestion. No focal infiltrates. No pleural effusion or pneumothorax. Old left rib fractures again noted. IMPRESSION: 1. PICC line noted with tip over SVC. 2. Stable cardiomegaly. Mild pulmonary venous congestion. No focal infiltrate. Electronically Signed   By: [place]   On: 04/09/2020 05:33    Scheduled Meds: . apixaban  5 mg Oral BID  . atorvastatin  40 mg Oral Daily  . Chlorhexidine Gluconate Cloth  6 each Topical Daily  . insulin aspart  0-20 Units Subcutaneous TID WC  . insulin glargine  22 Units Subcutaneous BID  . potassium chloride SA  40 mEq Oral BID  . potassium chloride  60 mEq Oral Once  . sodium chloride flush  3 mL Intravenous Q12H  . spironolactone  12.5 mg Oral Daily   Continuous Infusions: . amiodarone 30  mg/hr (04/09/20 0426)  . ferumoxytol    . furosemide (LASIX) 200 mg in dextrose 5% 100 mL ('2mg'$ /mL) infusion 20 mg/hr (04/09/20 0456)  . milrinone 0.375 mcg/kg/min (04/09/20 1246)     LOS: 6 days   Time spent: 35 minutes   Lamere Lightner Loann Quill, MD [facility]  If 7PM-7AM, please contact night-coverage [email or web address] 04/09/2020, 2:53 PM

## 2020-04-09 NOTE — Progress Notes (Signed)
Patient very irritated this morning, says no one tells him what's going on with him and the physicians are leaving him "in the dark". Patient actually told RN his plan of care and was correct, so I reassured him that his plan of care is still the same and he will be updated daily by his care team.

## 2020-04-09 NOTE — Progress Notes (Signed)
Patient constantly asking for water and ice, please remind patient of fluid restriction and encourage staying close to the restriction.

## 2020-04-10 DIAGNOSIS — I4891 Unspecified atrial fibrillation: Secondary | ICD-10-CM | POA: Diagnosis not present

## 2020-04-10 DIAGNOSIS — I10 Essential (primary) hypertension: Secondary | ICD-10-CM | POA: Diagnosis not present

## 2020-04-10 DIAGNOSIS — Z20822 Contact with and (suspected) exposure to covid-19: Secondary | ICD-10-CM | POA: Diagnosis not present

## 2020-04-10 DIAGNOSIS — E785 Hyperlipidemia, unspecified: Secondary | ICD-10-CM | POA: Diagnosis not present

## 2020-04-10 DIAGNOSIS — Z515 Encounter for palliative care: Secondary | ICD-10-CM | POA: Diagnosis not present

## 2020-04-10 DIAGNOSIS — I480 Paroxysmal atrial fibrillation: Secondary | ICD-10-CM | POA: Diagnosis not present

## 2020-04-10 DIAGNOSIS — I5043 Acute on chronic combined systolic (congestive) and diastolic (congestive) heart failure: Secondary | ICD-10-CM | POA: Diagnosis not present

## 2020-04-10 DIAGNOSIS — I132 Hypertensive heart and chronic kidney disease with heart failure and with stage 5 chronic kidney disease, or end stage renal disease: Secondary | ICD-10-CM | POA: Diagnosis not present

## 2020-04-10 DIAGNOSIS — E1169 Type 2 diabetes mellitus with other specified complication: Secondary | ICD-10-CM | POA: Diagnosis not present

## 2020-04-10 DIAGNOSIS — I5023 Acute on chronic systolic (congestive) heart failure: Secondary | ICD-10-CM | POA: Diagnosis not present

## 2020-04-10 DIAGNOSIS — J9601 Acute respiratory failure with hypoxia: Secondary | ICD-10-CM | POA: Diagnosis not present

## 2020-04-10 DIAGNOSIS — I4819 Other persistent atrial fibrillation: Secondary | ICD-10-CM | POA: Diagnosis not present

## 2020-04-10 LAB — BASIC METABOLIC PANEL
Anion gap: 12 (ref 5–15)
BUN: 34 mg/dL — ABNORMAL HIGH (ref 8–23)
CO2: 32 mmol/L (ref 22–32)
Calcium: 8.7 mg/dL — ABNORMAL LOW (ref 8.9–10.3)
Chloride: 90 mmol/L — ABNORMAL LOW (ref 98–111)
Creatinine, Ser: 1.98 mg/dL — ABNORMAL HIGH (ref 0.61–1.24)
GFR, Estimated: 36 mL/min — ABNORMAL LOW (ref >60)
Glucose, Bld: 354 mg/dL — ABNORMAL HIGH (ref 70–99)
Potassium: 4.1 mmol/L (ref 3.5–5.1)
Sodium: 134 mmol/L — ABNORMAL LOW (ref 135–145)

## 2020-04-10 LAB — GLUCOSE, CAPILLARY
Glucose-Capillary: 220 mg/dL — ABNORMAL HIGH (ref 70–99)
Glucose-Capillary: 275 mg/dL — ABNORMAL HIGH (ref 70–99)
Glucose-Capillary: 155 mg/dL — ABNORMAL HIGH (ref 70–99)
Glucose-Capillary: 253 mg/dL — ABNORMAL HIGH (ref 70–99)

## 2020-04-10 LAB — MAGNESIUM: Magnesium: 2.5 mg/dL — ABNORMAL HIGH (ref 1.7–2.4)

## 2020-04-10 LAB — COOXEMETRY PANEL
Carboxyhemoglobin: 1.5 % (ref 0.5–1.5)
Carboxyhemoglobin: 1.7 % — ABNORMAL HIGH (ref 0.5–1.5)
Methemoglobin: 1.2 % (ref 0.0–1.5)
Methemoglobin: 1 % (ref 0.0–1.5)
O2 Saturation: 40.8 %
O2 Saturation: 66.1 %
Total hemoglobin: 9.3 g/dL — ABNORMAL LOW (ref 12.0–16.0)
Total hemoglobin: 9.6 g/dL — ABNORMAL LOW (ref 12.0–16.0)

## 2020-04-10 MED ORDER — TORSEMIDE 20 MG PO TABS
80.0000 mg | ORAL_TABLET | Freq: Two times a day (BID) | ORAL | Status: DC
  Administered 2020-04-10 – 2020-04-13 (×7): 80 mg via ORAL
  Filled 2020-04-10 (×9): qty 4

## 2020-04-10 MED ORDER — INSULIN ASPART 100 UNIT/ML ~~LOC~~ SOLN
5.0000 [IU] | Freq: Three times a day (TID) | SUBCUTANEOUS | Status: DC
  Administered 2020-04-10 – 2020-04-16 (×19): 5 [IU] via SUBCUTANEOUS

## 2020-04-10 MED ORDER — MILRINONE LACTATE IN DEXTROSE 20-5 MG/100ML-% IV SOLN
0.2500 ug/kg/min | INTRAVENOUS | Status: DC
  Administered 2020-04-10 – 2020-04-12 (×6): 0.25 ug/kg/min via INTRAVENOUS
  Administered 2020-04-12: 0.125 ug/kg/min via INTRAVENOUS
  Administered 2020-04-13 – 2020-04-17 (×11): 0.25 ug/kg/min via INTRAVENOUS
  Filled 2020-04-10 (×16): qty 100

## 2020-04-10 NOTE — Progress Notes (Signed)
Now that patient is up, RN tried to get weight and patient refused again and began shoving items in room.

## 2020-04-10 NOTE — Progress Notes (Signed)
Inpatient Diabetes Program Recommendations  AACE/ADA: New Consensus Statement on Inpatient Glycemic Control   Target Ranges:  Prepandial:   less than 140 mg/dL      Peak postprandial:   less than 180 mg/dL (1-2 hours)      Critically ill patients:  140 - 180 mg/dL   Results for Raymond Pratt, HOMRICH (MRN [id number]) as of 04/10/2020 09:52  Ref. Range 04/09/2020 06:05 04/09/2020 12:09 04/09/2020 17:05 04/09/2020 21:19 04/10/2020 06:03  Glucose-Capillary Latest Ref Range: 70 - 99 mg/dL 184 (H) 223 (H) 171 (H) 277 (H) 220 (H)   Review of Glycemic Control  Diabetes history: DM2 Outpatient Diabetes medications: 70/30 60 units BID Current orders for Inpatient glycemic control: Lantus 22 units BID, Novolog 0-20 units TID with meals  Inpatient Diabetes Program Recommendations:    Insulin: Please consider ordering Novolog 5 units TID with meals for meal coverage if patient eats at least 50% of meals.  Thanks, Barnie Alderman, RN, MSN, CDE Diabetes Coordinator Inpatient Diabetes Program [phone] (Team Pager from 8am to 5pm)

## 2020-04-10 NOTE — Progress Notes (Signed)
Physical Therapy Treatment Patient Details Name: Raymond Pratt MRN: [id number] DOB: 1950/08/18 Today's Date: 04/10/2020    History of Present Illness Raymond Pratt is a 70 y.o. male with medical history significant of combined systolic and diastolic heart failure, atrial fibrillation, anemia, cellulitis, GI bleed, hypertension, OSA on CPAP, diabetes, nonobstructive CAD who presents with worsening shortness of breath. Found to have acute exacerbation of CHF.    PT Comments    Pt was seen for mobility with pt struggling to tolerate longer walk due to his posture and his edema.  Pt was able to maintain O2 at 97% during gait with a light drop to lower 90's with recovery.  Pt will continue on with PT to resume his previous gait and balance skills, and to return home with family support.  Pt is moving around the room with some level of independence, but still recommend HHPT for his posture and LE strength changes.   Follow Up Recommendations  Home health PT     Equipment Recommendations  None recommended by PT    Recommendations for Other Services       Precautions / Restrictions Precautions Precautions: Fall Precaution Comments: watch HR, wound on left heel and front or ankle Restrictions Weight Bearing Restrictions: No    Mobility  Bed Mobility Overal bed mobility: Modified Independent                Transfers Overall transfer level: Needs assistance Equipment used: Rolling walker (2 wheeled) Transfers: Sit to/from Stand Sit to Stand: Min guard         General transfer comment: pushed off to power up  Ambulation/Gait Ambulation/Gait assistance: Min guard Gait Distance (Feet): 150 Feet Assistive device: Rolling walker (2 wheeled) Gait Pattern/deviations: Step-through pattern;Decreased stride length;Wide base of support Gait velocity: reduced Gait velocity interpretation: <1.31 ft/sec, indicative of household ambulator General Gait Details: Cues  for activity pacing, min guard for safety   Stairs             Wheelchair Mobility    Modified Rankin (Stroke Patients Only)       Balance Overall balance assessment: Mild deficits observed, not formally tested                                          Cognition Arousal/Alertness: Awake/alert Behavior During Therapy: WFL for tasks assessed/performed Overall Cognitive Status: Within Functional Limits for tasks assessed                                        Exercises      General Comments General comments (skin integrity, edema, etc.): pt is up to walk with telemetry to supervise, walking 150' with O2 sats 97% during gait, recovered at low 90%      Pertinent Vitals/Pain Pain Assessment: Faces Faces Pain Scale: Hurts a little bit Pain Location: L foot/ankle Pain Descriptors / Indicators: Tender;Sore    Home Living                      Prior Function            PT Goals (current goals can now be found in the care plan section) Acute Rehab PT Goals PT Goal Formulation: With patient Progress towards PT goals: Progressing toward goals  Frequency    Min 3X/week      PT Plan Current plan remains appropriate    Co-evaluation              AM-PAC PT "6 Clicks" Mobility   Outcome Measure  Help needed turning from your back to your side while in a flat bed without using bedrails?: None Help needed moving from lying on your back to sitting on the side of a flat bed without using bedrails?: A Little Help needed moving to and from a bed to a chair (including a wheelchair)?: None Help needed standing up from a chair using your arms (e.g., wheelchair or bedside chair)?: A Little Help needed to walk in hospital room?: A Little Help needed climbing 3-5 steps with a railing? : A Little 6 Click Score: 20    End of Session Equipment Utilized During Treatment: Oxygen Activity Tolerance: Treatment limited secondary  to medical complications (Comment);Patient limited by fatigue Patient left: in bed;with call bell/phone within reach Nurse Communication: Mobility status PT Visit Diagnosis: Difficulty in walking, not elsewhere classified (R26.2)     Time: [id number] PT Time Calculation (min) (ACUTE ONLY): 39 min  Charges:  $Gait Training: 8-22 mins $Therapeutic Activity: 23-37 mins                 Ramond Dial 04/10/2020, 6:15 PM  Mee Hives, PT MS Acute Rehab Dept. Number: [facility] [place]

## 2020-04-10 NOTE — Plan of Care (Signed)
  Problem: Health Behavior/Discharge Planning: Goal: Ability to manage health-related needs will improve Outcome: Progressing   Problem: Clinical Measurements: Goal: Respiratory complications will improve Outcome: Progressing   

## 2020-04-10 NOTE — Progress Notes (Signed)
Patient refuses to get his morning weight, will try again later.

## 2020-04-10 NOTE — Progress Notes (Signed)
RN saw cigar in room and what looked like chewing tobacco. Threw in trash.

## 2020-04-10 NOTE — Progress Notes (Signed)
PROGRESS NOTE    Raymond Pratt  [id number] DOB: 1950/12/29 DOA: 03/24/2020 PCP: Shon Baton, MD   Brief Narrative:  Patient is 70 year old male with past medical history of combined systolic and diastolic CHF, A. fib, anemia, cellulitis, GI bleed, hypertension, OSA on CPAP, diabetes, coronary artery disease present to emergency department with shortness of breath.  ED course: Patient was noted to be hypoxic requiring 4 L of oxygen, tachycardic, tachypneic, potassium of 5.3, creatinine: 1.38, BNP: 195, troponin: 20, chest x-ray showed cardiomegaly, pulmonary vascular congestion and perihilar edema.  Patient started on Lasix and cardiology was consulted.  Assessment & Plan:   Acute on chronic systolic CHF: -Patient presented with signs of fluid overload.  BNP: 195.  On admission shows cardiac enlargement with pulmonary vascular congestion and perihilar edema. -Echo showed ejection fraction of 45 to 50%, mild LVH, hypokinesis, dilated RV/RA, moderate MR, moderate to severe TR -On milrinone, Lasix gtt., Aldactone-due to worsening renal function-milrinone dose decreased  to 0.25, switched IV Lasix to torsemide as per cardiology. -Continue to hold Entresto and digoxin for now -Strict INO's and daily weight and monitor signs of fluid overload -He is -17 L  and 38 pounds down since admission. -Appreciate cardiology's recommendation  Persistent A. Fib with RVR: -Continue with IV amiodarone, Eliquis -Cardiology is to plan TEE, DC-CV likely on Friday or Monday once patient fluid status improves-appreciate cardiology's recommendation  AKI on CKD stage III A: Renal function declined this morning with creatinine of 1.98. -Cardiology to decrease milrinone dose and switch to IV Lasix to torsemide.  Continue to monitor renal function closely  Anemia of chronic disease/history of GI bleed: -H&H is currently stable.  Continue to monitor.  - transfuse if hemoglobin less than  7  Uncontrolled type 2 diabetes mellitus: A1c 12.4%.  Continue Lantus,  sliding scale insulin -Blood sugar 354 this morning.  Added NovoLog 5 units 3 times daily with meals.  Monitor blood sugar closely  Chronic bilateral venous stasis: -Continue with wound care-no signs of infection.  Hypokalemia/hypomagnesemia: Replenished  Morbid obesity with BMI of 46: -Diet modification/exercise and weight loss recommended.  OSA: CPAP at nighttime  Lung nodule: Noted on CT scan.  Outpatient follow-up in 3 months.  Pressure injury: POA -Stage III left heel and right buttocks -Appreciate wound care recommendations  DVT prophylaxis: Eliquis Code Status: Full code Family Communication:  None present at bedside.  Plan of care discussed with patient in length and he verbalized understanding and agreed with it. Disposition Plan: To be determined  Consultants:   Cardiology  Procedures:   Echo  Antimicrobials:   None  Status is: Inpatient   Dispo: The patient is from: Home              Anticipated d/c is to: Home              Anticipated d/c date is: 3 days              Patient currently is not medically stable to d/c.   Difficult to place patient no         Subjective: Patient seen and examined.  Sitting comfortably on recliner.  On nasal cannula.  Tells me that overall he feels better but frustrated as he could not sleep at night.  Tells me that his legs swelling and breathing is improving.  Denies chest pain, fever, chills, nausea, vomiting, abdominal pain or diarrhea. Objective: Vitals:   04/09/20 2121 04/10/20 0030 04/10/20 [id number] 04/10/20 [id number]  BP: 108/71 116/82 105/76   Pulse: (!) 112     Resp: 20 (!) 22 (!) 22 20  Temp: 98.5 F (36.9 C) 97.9 F (36.6 C) 97.9 F (36.6 C)   TempSrc: Oral Oral Oral   SpO2: 95% 98% 95%   Weight:    132.5 kg  Height:        Intake/Output Summary (Last 24 hours) at 04/10/2020 1241 Last data filed at 04/10/2020 1054 Gross per 24 hour   Intake 2039.58 ml  Output 4902 ml  Net -2862.42 ml   Filed Weights   04/08/20 0855 04/09/20 0014 04/10/20 0826  Weight: (!) 139.1 kg 135.9 kg 132.5 kg    Examination:  General exam: Appears calm and comfortable, morbidly obese, on nasal cannula, appears frustrated Respiratory system: Clear to auscultation. Respiratory effort normal. Cardiovascular system: S1 & S2 heard, RRR. No JVD, murmurs, rubs, gallops or clicks. No pedal edema. Gastrointestinal system: Abdomen is nondistended, soft and nontender. No organomegaly or masses felt. Normal bowel sounds heard. Central nervous system: Alert and oriented. No focal neurological deficits. Extremities: Symmetric 5 x 5 power. Skin: No rashes, lesions or ulcers Psychiatry: Judgement and insight appear normal. Mood & affect appropriate.    Data Reviewed: I have personally reviewed following labs and imaging studies  CBC: Recent Labs  Lab 04/05/20 0441 04/06/20 0454 04/07/20 0819 04/08/20 0519 04/09/20 0509  WBC 7.3 7.2 6.9 6.8 6.6  NEUTROABS 4.4 4.6 4.6 4.4 4.2  HGB 9.0* 9.3* 9.1* 8.9* 8.9*  HCT 31.8* 33.4* 32.0* 31.2* 30.0*  MCV 82.8 82.1 81.0 81.5 80.0  PLT 365 398 378 396 [id number]*   Basic Metabolic Panel: Recent Labs  Lab 04/05/20 0441 04/06/20 0454 04/07/20 0700 04/08/20 0519 04/09/20 0509 04/10/20 0349  NA 138 137 129* 136 134* 134*  K 4.4 3.7 3.4* 3.7 3.7 4.1  CL 102 95* 85* 92* 89* 90*  CO2 '28 30 29 '$ 33* 34* 32  GLUCOSE 69* 96 495* 161* 185* 354*  BUN 43* 43* 42* 39* 36* 34*  CREATININE 1.60* 1.51* 1.79* 1.60* 1.65* 1.98*  CALCIUM 8.5* 9.0 8.0* 8.7* 8.8* 8.7*  MG 2.3 2.1 1.8 2.2 2.4 2.5*  PHOS 4.5 4.0 3.3 3.4 3.7  --    GFR: Estimated Creatinine Clearance: 46.8 mL/min (A) (by C-G formula based on SCr of 1.98 mg/dL (H)). Liver Function Tests: Recent Labs  Lab 04/05/20 0441 04/06/20 0454 04/07/20 0700 04/08/20 0519 04/09/20 0509  AST '25 25 16 16 '$ 14*  ALT 46* 44 32 28 25  ALKPHOS 82 82 78 73 72  BILITOT  0.5 1.0 0.8 1.1 1.2  PROT 6.4* 6.8 6.1* 6.5 6.8  ALBUMIN 3.1* 3.3* 2.9* 3.1* 3.1*   No results for input(s): LIPASE, AMYLASE in the last 168 hours. No results for input(s): AMMONIA in the last 168 hours. Coagulation Profile: Recent Labs  Lab 03/17/2020 2019  INR 1.2   Cardiac Enzymes: No results for input(s): CKTOTAL, CKMB, CKMBINDEX, TROPONINI in the last 168 hours. BNP (last 3 results) No results for input(s): PROBNP in the last 8760 hours. HbA1C: No results for input(s): HGBA1C in the last 72 hours. CBG: Recent Labs  Lab 04/09/20 0605 04/09/20 1209 04/09/20 1705 04/09/20 2119 04/10/20 0603  GLUCAP 184* 223* 171* 277* 220*   Lipid Profile: No results for input(s): CHOL, HDL, LDLCALC, TRIG, CHOLHDL, LDLDIRECT in the last 72 hours. Thyroid Function Tests: No results for input(s): TSH, T4TOTAL, FREET4, T3FREE, THYROIDAB in the last 72 hours. Anemia Panel: Recent Labs  04/09/20 0509 04/09/20 0554 04/09/20 0555  VITAMINB12 1,310*  --   --   FOLATE  --  23.2  --   FERRITIN 23*  --   --   TIBC 357  --   --   IRON 30*  --   --   RETICCTPCT  --   --  2.3   Sepsis Labs: No results for input(s): PROCALCITON, LATICACIDVEN in the last 168 hours.  Recent Results (from the past 240 hour(s))  SARS Coronavirus 2 by RT PCR (hospital order, performed in [facility] hospital lab) Nasopharyngeal Nasopharyngeal Swab     Status: None   Collection Time: 03/29/2020  9:51 PM   Specimen: Nasopharyngeal Swab  Result Value Ref Range Status   SARS Coronavirus 2 NEGATIVE NEGATIVE Final    Comment: Performed at [facility], [place] [address] [place], [place] [address]      Radiology Studies: DG CHEST PORT 1 VIEW  Result Date: 04/09/2020 CLINICAL DATA:  Shortness of breath. EXAM: PORTABLE CHEST 1 VIEW COMPARISON:  04/07/2020.  12/05/2018. FINDINGS: PICC line noted with tip over SVC. Cardiomegaly. Mild pulmonary venous congestion. No focal infiltrates. No pleural effusion or  pneumothorax. Old left rib fractures again noted. IMPRESSION: 1. PICC line noted with tip over SVC. 2. Stable cardiomegaly. Mild pulmonary venous congestion. No focal infiltrate. Electronically Signed   By: Linnell Camp   On: 04/09/2020 05:33    Scheduled Meds: . apixaban  5 mg Oral BID  . atorvastatin  40 mg Oral Daily  . Chlorhexidine Gluconate Cloth  6 each Topical Daily  . insulin aspart  0-20 Units Subcutaneous TID WC  . insulin glargine  22 Units Subcutaneous BID  . potassium chloride SA  40 mEq Oral BID  . sodium chloride flush  3 mL Intravenous Q12H  . spironolactone  12.5 mg Oral Daily  . torsemide  80 mg Oral BID   Continuous Infusions: . amiodarone 30 mg/hr (04/10/20 [id number])  . ferumoxytol 510 mg (04/09/20 1654)  . milrinone 0.25 mcg/kg/min (04/10/20 1223)     LOS: 7 days   Time spent: 35 minutes   Wendal Wilkie Loann Quill, MD [facility]  If 7PM-7AM, please contact night-coverage [email or web address] 04/10/2020, 12:41 PM

## 2020-04-10 NOTE — Progress Notes (Addendum)
Advanced Heart Failure Rounding Note   Subjective:    Milrinone 0.125 started 1/29 for co-ox 48%. Milrinone has been increased to 0.375 mcg. CO-OX 66%   Yesterday diuresed with lasix drip. Brisk diuresis noted. Overall weight down 38 pounds.   Today he went back in NSR. Continue amio drip.   Denies SOB. Feeling a little better.   Objective:   Weight Range:  Vital Signs:   Temp:  [97.9 F (36.6 C)-98.5 F (36.9 C)] 97.9 F (36.6 C) (02/03 0420) Pulse Rate:  [112] 112 (02/02 2121) Resp:  [20-22] 20 (02/03 0826) BP: (105-120)/(67-82) 105/76 (02/03 0420) SpO2:  [95 %-98 %] 95 % (02/03 0420) Weight:  [132.5 kg] 132.5 kg (02/03 0826) Last BM Date: 04/09/20  Weight change: Filed Weights   04/08/20 0855 04/09/20 0014 04/10/20 0826  Weight: (!) 139.1 kg 135.9 kg 132.5 kg    Intake/Output:   Intake/Output Summary (Last 24 hours) at 04/10/2020 1033 Last data filed at 04/10/2020 0855 Gross per 24 hour  Intake 2039.58 ml  Output 4252 ml  Net -2212.42 ml  CVP 4 PHYSICAL EXAM:  General: In bed. No resp difficulty HEENT: normal Neck: supple. JVP 5-6 . Carotids 2+ bilat; no bruits. No lymphadenopathy or thryomegaly appreciated. Cor: PMI nondisplaced. Irregular rate & rhythm. No rubs, gallops or murmurs. Lungs: clear Abdomen: soft, nontender, distended. No hepatosplenomegaly. No bruits or masses. Good bowel sounds. Extremities: no cyanosis, clubbing, rash, R and LLE dressings.  Neuro: alert & orientedx3, cranial nerves grossly intact. moves all 4 extremities w/o difficulty. Affect pleasant  Telemetry:  ST Was in A fib but converted to NSR around 10 today. Will get EKG  Labs: Basic Metabolic Panel: Recent Labs  Lab 04/05/20 0441 04/06/20 0454 04/07/20 0700 04/08/20 0519 04/09/20 0509 04/10/20 0349  NA 138 137 129* 136 134* 134*  K 4.4 3.7 3.4* 3.7 3.7 4.1  CL 102 95* 85* 92* 89* 90*  CO2 '28 30 29 '$ 33* 34* 32  GLUCOSE 69* 96 495* 161* 185* 354*  BUN 43* 43* 42* 39*  36* 34*  CREATININE 1.60* 1.51* 1.79* 1.60* 1.65* 1.98*  CALCIUM 8.5* 9.0 8.0* 8.7* 8.8* 8.7*  MG 2.3 2.1 1.8 2.2 2.4 2.5*  PHOS 4.5 4.0 3.3 3.4 3.7  --     Liver Function Tests: Recent Labs  Lab 04/05/20 0441 04/06/20 0454 04/07/20 0700 04/08/20 0519 04/09/20 0509  AST '25 25 16 16 '$ 14*  ALT 46* 44 32 28 25  ALKPHOS 82 82 78 73 72  BILITOT 0.5 1.0 0.8 1.1 1.2  PROT 6.4* 6.8 6.1* 6.5 6.8  ALBUMIN 3.1* 3.3* 2.9* 3.1* 3.1*   No results for input(s): LIPASE, AMYLASE in the last 168 hours. No results for input(s): AMMONIA in the last 168 hours.  CBC: Recent Labs  Lab 04/05/20 0441 04/06/20 0454 04/07/20 0819 04/08/20 0519 04/09/20 0509  WBC 7.3 7.2 6.9 6.8 6.6  NEUTROABS 4.4 4.6 4.6 4.4 4.2  HGB 9.0* 9.3* 9.1* 8.9* 8.9*  HCT 31.8* 33.4* 32.0* 31.2* 30.0*  MCV 82.8 82.1 81.0 81.5 80.0  PLT 365 398 378 396 437*    Cardiac Enzymes: No results for input(s): CKTOTAL, CKMB, CKMBINDEX, TROPONINI in the last 168 hours.  BNP: BNP (last 3 results) Recent Labs    12/10/19 0057 02/13/20 0807 03/10/2020 2019  BNP 154.7* 111.6* 195.0*    ProBNP (last 3 results) No results for input(s): PROBNP in the last 8760 hours.    Other results:  Imaging: DG CHEST  PORT 1 VIEW  Result Date: 04/09/2020 CLINICAL DATA:  Shortness of breath. EXAM: PORTABLE CHEST 1 VIEW COMPARISON:  04/07/2020.  12/05/2018. FINDINGS: PICC line noted with tip over SVC. Cardiomegaly. Mild pulmonary venous congestion. No focal infiltrates. No pleural effusion or pneumothorax. Old left rib fractures again noted. IMPRESSION: 1. PICC line noted with tip over SVC. 2. Stable cardiomegaly. Mild pulmonary venous congestion. No focal infiltrate. Electronically Signed   By: Marcello Moores  Register   On: 04/09/2020 05:33     Medications:     Scheduled Medications: . apixaban  5 mg Oral BID  . atorvastatin  40 mg Oral Daily  . Chlorhexidine Gluconate Cloth  6 each Topical Daily  . insulin aspart  0-20 Units Subcutaneous  TID WC  . insulin glargine  22 Units Subcutaneous BID  . potassium chloride SA  40 mEq Oral BID  . sodium chloride flush  3 mL Intravenous Q12H  . spironolactone  12.5 mg Oral Daily    Infusions: . amiodarone 30 mg/hr (04/10/20 [id number])  . ferumoxytol 510 mg (04/09/20 1654)  . furosemide (LASIX) 200 mg in dextrose 5% 100 mL ('2mg'$ /mL) infusion 20 mg/hr (04/10/20 0307)  . milrinone 0.375 mcg/kg/min (04/10/20 0923)    PRN Medications: acetaminophen **OR** acetaminophen, [COMPLETED] diphenhydrAMINE **FOLLOWED BY** diphenhydrAMINE, hydrocortisone cream, hydrOXYzine, ondansetron (ZOFRAN) IV, polyethylene glycol, sodium chloride flush   Assessment/Plan:   1.  Acute on chronic systolic heart failure - Echo (11/2018) EF 25-30%, trace MR, trivial TR, moderately reduced RSVF.   - Cath (12/2018) RA 10, RV 55/8, PA 54/15 (27), PCW = 16, Fick CO/CI = 7.0/2.8 - Echo (04/04/2020) EF 45-50%, mild LVH, hypokinesis LV, dilated LA, dilated RV/RA, mod MR, mod-severe TR, RVSP > 60 mmHg.  - Milrinone started on 1/29 for co-ox 48%. Increased to 0.375 on 1/31.  - CO-OX 60%. Cut back milrinone 0.25 mcg.   - CVP 3-4 . Volume status much improved. Overall weight weigh down 38 pounds. Stop lasix drip. Start torsemide 80 mg twice a day.  -- Likely triggered by AF, monitor closely while on inotropes  -   Holding Entresto and dig for now.  - Continue Spiro 12.5 mg   2. Persistent AF - has been on amio at home - suspect has been in AF for several weeks. ECG in 12/21 was NSR - Back in NSR this morning. EKG to confirm.  - Continue IV amio while on milrinone - TFTs not too bad - continue Eliquis  3. Chronic respiratory failure/OSA - Continue with CPAP at night for OSA  4. CKD 3b - Baseline Cr 1.5-1.7 - Creatinine trending up 1.6>2 -  continue milrinone support  5. Anemia/Hx of GIB - Hgb stable  9.3 on CO-OX   - EGD 12/2019 nonbleeding duodenal ulcer with gastritis - Received Feraheme 04/09/20   6. Venous  stasis wounds L tib/fib, L heel & Sacral wound - Wound care consulted. Primary team managing   7. T2DM - A1c 12.4% recent hospitalization - Per primary team  8. Morbidly obese Body mass index is 45.73 kg/m.  9. Lung nodule - Will need outpatient imaging in 3 months, 9 mm CT (03/2020)  10. Hypokalemia/ Hypomagnesemia - K /Mag stable.  - Keep K> 4.0 Mg > 2.0   11. Hypervolemic Hyponatremia - Na 134  - continue diuresis - fluid restrict   Would be concerned about SGT2i with mild erythema under pannus.    Length of Stay: [place] NP-C  04/10/2020, 10:33 AM  Advanced Heart Failure Team Pager [phone] (M-F; [place])  Please contact [facility] for night-coverage after hours (4p -7a ) and weekends on [email or web address]    Patient seen and examined with the above-signed Advanced Practice Provider and/or Housestaff. I personally reviewed laboratory data, imaging studies and relevant notes. I independently examined the patient and formulated the important aspects of the plan. I have edited the note to reflect any of my changes or salient points. I have personally discussed the plan with the patient and/or family.  He remains on milrinone, IV lasix and IV amio. Back in NSR as of 10a. Weight down 38 pounds. CVP 3. Co-ox 60%   General: Sitting in chair. No resp difficulty HEENT: normal Neck: supple. no JVD. Carotids 2+ bilat; no bruits. No lymphadenopathy or thryomegaly appreciated. Cor: PMI nondisplaced. Regular rate & rhythm. No rubs, gallops or murmurs. Lungs: clear decreased at bases.  Abdomen: obese soft, nontender, nondistended. No hepatosplenomegaly. No bruits or masses. Good bowel sounds. Extremities: no cyanosis, clubbing, rash, trace edema Neuro: alert & orientedx3, cranial nerves grossly intact. moves all 4 extremities w/o difficulty. Affect pleasant  Cut milrinone down to 0.25. Switch IV lasix to torsemide - start tomorrow given AKI. Follow renal function closely.  Continue Eliquis.   [name], MD  11:57 AM

## 2020-04-11 DIAGNOSIS — A4150 Gram-negative sepsis, unspecified: Secondary | ICD-10-CM | POA: Diagnosis not present

## 2020-04-11 DIAGNOSIS — E1169 Type 2 diabetes mellitus with other specified complication: Secondary | ICD-10-CM | POA: Diagnosis not present

## 2020-04-11 DIAGNOSIS — I4819 Other persistent atrial fibrillation: Secondary | ICD-10-CM | POA: Diagnosis not present

## 2020-04-11 DIAGNOSIS — I480 Paroxysmal atrial fibrillation: Secondary | ICD-10-CM | POA: Diagnosis not present

## 2020-04-11 DIAGNOSIS — E785 Hyperlipidemia, unspecified: Secondary | ICD-10-CM | POA: Diagnosis not present

## 2020-04-11 DIAGNOSIS — J9601 Acute respiratory failure with hypoxia: Secondary | ICD-10-CM | POA: Diagnosis not present

## 2020-04-11 DIAGNOSIS — I5023 Acute on chronic systolic (congestive) heart failure: Secondary | ICD-10-CM | POA: Diagnosis not present

## 2020-04-11 DIAGNOSIS — I10 Essential (primary) hypertension: Secondary | ICD-10-CM | POA: Diagnosis not present

## 2020-04-11 DIAGNOSIS — I4891 Unspecified atrial fibrillation: Secondary | ICD-10-CM | POA: Diagnosis not present

## 2020-04-11 LAB — GLUCOSE, CAPILLARY
Glucose-Capillary: 270 mg/dL — ABNORMAL HIGH (ref 70–99)
Glucose-Capillary: 201 mg/dL — ABNORMAL HIGH (ref 70–99)
Glucose-Capillary: 125 mg/dL — ABNORMAL HIGH (ref 70–99)
Glucose-Capillary: 118 mg/dL — ABNORMAL HIGH (ref 70–99)

## 2020-04-11 LAB — BASIC METABOLIC PANEL
Anion gap: 11 (ref 5–15)
BUN: 32 mg/dL — ABNORMAL HIGH (ref 8–23)
CO2: 32 mmol/L (ref 22–32)
Calcium: 8.8 mg/dL — ABNORMAL LOW (ref 8.9–10.3)
Chloride: 94 mmol/L — ABNORMAL LOW (ref 98–111)
Creatinine, Ser: 1.67 mg/dL — ABNORMAL HIGH (ref 0.61–1.24)
GFR, Estimated: 44 mL/min — ABNORMAL LOW (ref >60)
Glucose, Bld: 169 mg/dL — ABNORMAL HIGH (ref 70–99)
Potassium: 3.8 mmol/L (ref 3.5–5.1)
Sodium: 137 mmol/L (ref 135–145)

## 2020-04-11 LAB — COOXEMETRY PANEL
Carboxyhemoglobin: 1.7 % — ABNORMAL HIGH (ref 0.5–1.5)
Methemoglobin: 1.3 % (ref 0.0–1.5)
O2 Saturation: 56.3 %
Total hemoglobin: 9.8 g/dL — ABNORMAL LOW (ref 12.0–16.0)

## 2020-04-11 LAB — MAGNESIUM: Magnesium: 2.5 mg/dL — ABNORMAL HIGH (ref 1.7–2.4)

## 2020-04-11 MED ORDER — TRAMADOL HCL 50 MG PO TABS
50.0000 mg | ORAL_TABLET | Freq: Once | ORAL | Status: AC | PRN
  Administered 2020-04-11: 50 mg via ORAL
  Filled 2020-04-11: qty 1

## 2020-04-11 NOTE — Progress Notes (Signed)
Patient refused the use of CPAP for the evening.  

## 2020-04-11 NOTE — Care Management Important Message (Signed)
Important Message  Patient Details  Name: Raymond Pratt MRN: [id number] Date of Birth: Mar 19, 1950   Medicare Important Message Given:  Yes     [name] 04/11/2020, 10:04 AM

## 2020-04-11 NOTE — Progress Notes (Signed)
Physical Therapy Treatment Patient Details Name: Raymond Pratt MRN: [id number] DOB: Oct 16, 1950 Today's Date: 04/11/2020    History of Present Illness Raymond Pratt is a 70 y.o. male with medical history significant of combined systolic and diastolic heart failure, atrial fibrillation, anemia, cellulitis, GI bleed, hypertension, OSA on CPAP, diabetes, nonobstructive CAD who presents with worsening shortness of breath. Found to have acute exacerbation of CHF.    PT Comments    Patient progressing slowly towards PT goals. Reports breathing has slightly improved but continues to get SOB worsened with activity. Tolerated gait training with Min guard assist and use of RW for support. HR up to 154 bpm a-fib but 02 stable in 90s. Required 1 standing rest break. Encouraged walking over the weekend with nursing. Will continue to follow and progress as tolerated.    Follow Up Recommendations  Home health PT     Equipment Recommendations  None recommended by PT    Recommendations for Other Services       Precautions / Restrictions Precautions Precautions: Fall;Other (comment) Precaution Comments: watch HR, wound on left heel and front of ankle Restrictions Weight Bearing Restrictions: No    Mobility  Bed Mobility Overal bed mobility: Modified Independent Bed Mobility: Supine to Sit     Supine to sit: Modified independent (Device/Increase time);HOB elevated     General bed mobility comments: using side rails and momentum to get to EOB.  Transfers Overall transfer level: Needs assistance Equipment used: Rolling walker (2 wheeled) Transfers: Sit to/from Stand Sit to Stand: Supervision         General transfer comment: Supervision for safety. Stood from [employer].  Ambulation/Gait Ambulation/Gait assistance: Min guard Gait Distance (Feet): 85 Feet Assistive device: Rolling walker (2 wheeled) Gait Pattern/deviations: Step-through pattern;Decreased stride  length;Wide base of support   Gait velocity interpretation: 1.31 - 2.62 ft/sec, indicative of limited community ambulator General Gait Details: Steady gait with use of RW, cues for upright. 1 standing rest break. 2-3/4 DOE. HR up to 154 bpm A-fib.   Stairs             Wheelchair Mobility    Modified Rankin (Stroke Patients Only)       Balance Overall balance assessment: Needs assistance Sitting-balance support: Feet supported;No upper extremity supported Sitting balance-Leahy Scale: Good     Standing balance support: During functional activity Standing balance-Leahy Scale: Poor Standing balance comment: Requires UE support for balance/energy conservation                            Cognition Arousal/Alertness: Awake/alert Behavior During Therapy: WFL for tasks assessed/performed Overall Cognitive Status: Within Functional Limits for tasks assessed                                 General Comments: HOH      Exercises      General Comments        Pertinent Vitals/Pain Pain Assessment: Faces Faces Pain Scale: Hurts little more Pain Location: chronic back and left neck Pain Descriptors / Indicators: Tingling;Aching Pain Intervention(s): Monitored during session;Repositioned    Home Living                      Prior Function            PT Goals (current goals can now be found in the care plan section)  Progress towards PT goals: Progressing toward goals    Frequency    Min 3X/week      PT Plan Current plan remains appropriate    Co-evaluation              AM-PAC PT "6 Clicks" Mobility   Outcome Measure  Help needed turning from your back to your side while in a flat bed without using bedrails?: None Help needed moving from lying on your back to sitting on the side of a flat bed without using bedrails?: None Help needed moving to and from a bed to a chair (including a wheelchair)?: None Help needed  standing up from a chair using your arms (e.g., wheelchair or bedside chair)?: A Little Help needed to walk in hospital room?: A Little Help needed climbing 3-5 steps with a railing? : A Little 6 Click Score: 21    End of Session   Activity Tolerance: Patient tolerated treatment well;Patient limited by fatigue Patient left: in bed;with call bell/phone within reach (sitting EOB eating lunch) Nurse Communication: Mobility status PT Visit Diagnosis: Difficulty in walking, not elsewhere classified (R26.2)     Time: [id number] PT Time Calculation (min) (ACUTE ONLY): 25 min  Charges:  $Gait Training: 8-22 mins $Therapeutic Exercise: 8-22 mins                     Marisa Severin, PT, DPT Acute Rehabilitation Services Pager [phone] Office [place] 04/11/2020, 2:12 PM

## 2020-04-11 NOTE — Progress Notes (Signed)
PROGRESS NOTE    Raymond Pratt  [id number] DOB: [birth year] DOA: 03/10/2020 PCP: Shon Baton, MD   Brief Narrative:  Patient is 70 year old male with past medical history of combined systolic and diastolic CHF, A. fib, anemia, cellulitis, GI bleed, hypertension, OSA on CPAP, diabetes, coronary artery disease present to emergency department with shortness of breath.  ED course: Patient was noted to be hypoxic requiring 4 L of oxygen, tachycardic, tachypneic, potassium of 5.3, creatinine: 1.38, BNP: 195, troponin: 20, chest x-ray showed cardiomegaly, pulmonary vascular congestion and perihilar edema.  Patient started on Lasix and cardiology was consulted.  Assessment & Plan:   Acute hypoxemic respiratory failure in the setting of acute on chronic systolic CHF: -Patient presented with signs of fluid overload.  BNP: 195.  On admission shows cardiac enlargement with pulmonary vascular congestion and perihilar edema.  Requiring 3 L of oxygen via nasal cannula -Echo showed ejection fraction of 45 to 50%, mild LVH, hypokinesis, dilated RV/RA, moderate MR, moderate to severe TR -due to worsening renal function-milrinone dose decreased  to 0.25, switched IV Lasix to torsemide 80 mg twice daily as per cardiology on 2/3.  Continue Aldactone. -Continue to hold Entresto and digoxin for now -Strict INO's and daily weight and monitor signs of fluid overload -He is -17 L  and 42 pounds down since admission. -Appreciate cardiology's recommendation  Persistent A. Fib with RVR: -Continue with IV amiodarone, Eliquis -Cardiology is to plan TEE, DC-CV likely on Friday or Monday once patient fluid status improves-appreciate cardiology's recommendation  AKI on CKD stage III A:  -Renal function improved this morning from creatinine 1.  98-1.67 and GFR 36-44 -Monitor renal function closely  Anemia of chronic disease/history of GI bleed: -H&H is currently stable.  Continue to monitor.  - transfuse  if hemoglobin less than 7  Uncontrolled type 2 diabetes mellitus: A1c 12.4%.  Continue Lantus,  sliding scale insulin and NovoLog 5 units 3 times daily with meals.  Monitor blood sugar closely  Chronic bilateral venous stasis: -Continue with wound care-no signs of infection.  Hypokalemia/hypomagnesemia: Replenished  Morbid obesity with BMI of 46: -Diet modification/exercise and weight loss recommended.  OSA: CPAP at nighttime  Lung nodule: Noted on CT scan.  Outpatient follow-up in 3 months.  Pressure injury: POA -Stage III left heel and right buttocks -Appreciate wound care recommendations  DVT prophylaxis: Eliquis Code Status: Full code Family Communication:  None present at bedside.  Plan of care discussed with patient in length and he verbalized understanding and agreed with it. Disposition Plan: To be determined  Consultants:   Cardiology  Procedures:   Echo  Antimicrobials:   None  Status is: Inpatient   Dispo: The patient is from: Home              Anticipated d/c is to: Home              Anticipated d/c date is: 3 days              Patient currently is not medically stable to d/c.   Difficult to place patient no         Subjective: Patient seen and examined.  Resting comfortably on the bed.  Tells me that overall his breathing and leg swelling is improving.  Denies chest pain, fever, chills, nausea or vomiting.    Objective: Vitals:   04/10/20 2039 04/11/20 0410 04/11/20 0751 04/11/20 0800  BP: 108/61 115/68    Pulse: 99 95    Resp: Marland Kitchen)  $'21 20  18  'P$ Temp: 98.5 F (36.9 C) 98.7 F (37.1 C) 98.4 F (36.9 C)   TempSrc: Oral Oral Oral   SpO2: 97% 100%    Weight:  130.9 kg    Height:        Intake/Output Summary (Last 24 hours) at 04/11/2020 1311 Last data filed at 04/11/2020 1128 Gross per 24 hour  Intake 1898.57 ml  Output 1900 ml  Net -1.43 ml   Filed Weights   04/09/20 0014 04/10/20 0826 04/11/20 0410  Weight: 135.9 kg 132.5 kg 130.9  kg    Examination:  General exam: Appears calm and comfortable, morbidly obese, on nasal cannula, Respiratory system: Clear to auscultation. Respiratory effort normal. Cardiovascular system: S1 & S2 heard, RRR. No JVD, murmurs, rubs, gallops or clicks.  Trace pedal edema noted bilaterally. Gastrointestinal system: Abdomen is obese, soft and nontender. No organomegaly or masses felt. Normal bowel sounds heard. Central nervous system: Alert and oriented. No focal neurological deficits. Extremities: Symmetric 5 x 5 power. Skin: Bilateral chronic venous stasis noted in both lower extremities. Psychiatry: Judgement and insight appear normal. Mood & affect appropriate.    Data Reviewed: I have personally reviewed following labs and imaging studies  CBC: Recent Labs  Lab 04/05/20 0441 04/06/20 0454 04/07/20 0819 04/08/20 0519 04/09/20 0509  WBC 7.3 7.2 6.9 6.8 6.6  NEUTROABS 4.4 4.6 4.6 4.4 4.2  HGB 9.0* 9.3* 9.1* 8.9* 8.9*  HCT 31.8* 33.4* 32.0* 31.2* 30.0*  MCV 82.8 82.1 81.0 81.5 80.0  PLT 365 398 378 396 [id number]*   Basic Metabolic Panel: Recent Labs  Lab 04/05/20 0441 04/06/20 0454 04/07/20 0700 04/08/20 0519 04/09/20 0509 04/10/20 0349 04/11/20 0500  NA 138 137 129* 136 134* 134* 137  K 4.4 3.7 3.4* 3.7 3.7 4.1 3.8  CL 102 95* 85* 92* 89* 90* 94*  CO2 '28 30 29 '$ 33* 34* 32 32  GLUCOSE 69* 96 495* 161* 185* 354* 169*  BUN 43* 43* 42* 39* 36* 34* 32*  CREATININE 1.60* 1.51* 1.79* 1.60* 1.65* 1.98* 1.67*  CALCIUM 8.5* 9.0 8.0* 8.7* 8.8* 8.7* 8.8*  MG 2.3 2.1 1.8 2.2 2.4 2.5* 2.5*  PHOS 4.5 4.0 3.3 3.4 3.7  --   --    GFR: Estimated Creatinine Clearance: 55.1 mL/min (A) (by C-G formula based on SCr of 1.67 mg/dL (H)). Liver Function Tests: Recent Labs  Lab 04/05/20 0441 04/06/20 0454 04/07/20 0700 04/08/20 0519 04/09/20 0509  AST '25 25 16 16 '$ 14*  ALT 46* 44 32 28 25  ALKPHOS 82 82 78 73 72  BILITOT 0.5 1.0 0.8 1.1 1.2  PROT 6.4* 6.8 6.1* 6.5 6.8  ALBUMIN 3.1*  3.3* 2.9* 3.1* 3.1*   No results for input(s): LIPASE, AMYLASE in the last 168 hours. No results for input(s): AMMONIA in the last 168 hours. Coagulation Profile: No results for input(s): INR, PROTIME in the last 168 hours. Cardiac Enzymes: No results for input(s): CKTOTAL, CKMB, CKMBINDEX, TROPONINI in the last 168 hours. BNP (last 3 results) No results for input(s): PROBNP in the last 8760 hours. HbA1C: No results for input(s): HGBA1C in the last 72 hours. CBG: Recent Labs  Lab 04/10/20 1311 04/10/20 1643 04/10/20 2147 04/11/20 0620 04/11/20 1126  GLUCAP 275* 155* 253* 270* 201*   Lipid Profile: No results for input(s): CHOL, HDL, LDLCALC, TRIG, CHOLHDL, LDLDIRECT in the last 72 hours. Thyroid Function Tests: No results for input(s): TSH, T4TOTAL, FREET4, T3FREE, THYROIDAB in the last 72 hours. Anemia Panel: Recent  Labs    04/09/20 0509 04/09/20 0554 04/09/20 0555  VITAMINB12 1,310*  --   --   FOLATE  --  23.2  --   FERRITIN 23*  --   --   TIBC 357  --   --   IRON 30*  --   --   RETICCTPCT  --   --  2.3   Sepsis Labs: No results for input(s): PROCALCITON, LATICACIDVEN in the last 168 hours.  Recent Results (from the past 240 hour(s))  SARS Coronavirus 2 by RT PCR (hospital order, performed in [facility] hospital lab) Nasopharyngeal Nasopharyngeal Swab     Status: None   Collection Time: 03/16/2020  9:51 PM   Specimen: Nasopharyngeal Swab  Result Value Ref Range Status   SARS Coronavirus 2 NEGATIVE NEGATIVE Final    Comment: Performed at [facility], [place] [address] [place],  [address]      Radiology Studies: No results found.  Scheduled Meds: . apixaban  5 mg Oral BID  . atorvastatin  40 mg Oral Daily  . Chlorhexidine Gluconate Cloth  6 each Topical Daily  . insulin aspart  0-20 Units Subcutaneous TID WC  . insulin aspart  5 Units Subcutaneous TID WC  . insulin glargine  22 Units Subcutaneous BID  . potassium chloride SA  40 mEq Oral BID   . sodium chloride flush  3 mL Intravenous Q12H  . spironolactone  12.5 mg Oral Daily  . torsemide  80 mg Oral BID   Continuous Infusions: . amiodarone 30 mg/hr (04/11/20 1206)  . ferumoxytol 510 mg (04/09/20 1654)  . milrinone 0.25 mcg/kg/min (04/11/20 1205)     LOS: 8 days   Time spent: 35 minutes   Eduin Friedel Loann Quill, MD [facility]  If 7PM-7AM, please contact night-coverage [email or web address] 04/11/2020, 1:11 PM

## 2020-04-11 NOTE — Progress Notes (Addendum)
Advanced Heart Failure Rounding Note   Subjective:    On milrinone 0.25. Co-ox 56%.  Wt down another 4 lb. Lasix gtt discontinued yesterday. CVP 4 today   SCr trending down, 1.98>>1.67    Overall feels better. No dyspnea.     Objective:   Weight Range:  Vital Signs:   Temp:  [98.4 F (36.9 C)-98.7 F (37.1 C)] 98.4 F (36.9 C) (02/04 0751) Pulse Rate:  [95-99] 95 (02/04 0410) Resp:  [18-21] 18 (02/04 0800) BP: (108-115)/(61-68) 115/68 (02/04 0410) SpO2:  [97 %-100 %] 100 % (02/04 0410) Weight:  [130.9 kg] 130.9 kg (02/04 0410) Last BM Date: 04/10/20  Weight change: Filed Weights   04/09/20 0014 04/10/20 0826 04/11/20 0410  Weight: 135.9 kg 132.5 kg 130.9 kg    Intake/Output:   Intake/Output Summary (Last 24 hours) at 04/11/2020 1026 Last data filed at 04/11/2020 0752 Gross per 24 hour  Intake 1898.57 ml  Output 2300 ml  Net -401.43 ml   PHYSICAL EXAM: CVP 4 General:  Morbidly obese male. No respiratory difficulty HEENT: normal Neck: supple. Thick neck JVD no well visualized. Carotids 2+ bilat; no bruits. No lymphadenopathy or thyromegaly appreciated. Cor: PMI nondisplaced. Regular rhythm, mildly tachy rate. No rubs, gallops or murmurs. Lungs: clear Abdomen: soft, nontender, nondistended. No hepatosplenomegaly. No bruits or masses. Good bowel sounds. Extremities: no cyanosis, clubbing, rash, trace bilateral LE edema Neuro: alert & oriented x 3, cranial nerves grossly intact. moves all 4 extremities w/o difficulty. Affect pleasant.   Telemetry:  Sinus tach low 100s.   Labs: Basic Metabolic Panel: Recent Labs  Lab 04/05/20 0441 04/06/20 0454 04/07/20 0700 04/08/20 0519 04/09/20 0509 04/10/20 0349 04/11/20 0500  NA 138 137 129* 136 134* 134* 137  K 4.4 3.7 3.4* 3.7 3.7 4.1 3.8  CL 102 95* 85* 92* 89* 90* 94*  CO2 '28 30 29 '$ 33* 34* 32 32  GLUCOSE 69* 96 495* 161* 185* 354* 169*  BUN 43* 43* 42* 39* 36* 34* 32*  CREATININE 1.60* 1.51* 1.79* 1.60*  1.65* 1.98* 1.67*  CALCIUM 8.5* 9.0 8.0* 8.7* 8.8* 8.7* 8.8*  MG 2.3 2.1 1.8 2.2 2.4 2.5* 2.5*  PHOS 4.5 4.0 3.3 3.4 3.7  --   --     Liver Function Tests: Recent Labs  Lab 04/05/20 0441 04/06/20 0454 04/07/20 0700 04/08/20 0519 04/09/20 0509  AST '25 25 16 16 '$ 14*  ALT 46* 44 32 28 25  ALKPHOS 82 82 78 73 72  BILITOT 0.5 1.0 0.8 1.1 1.2  PROT 6.4* 6.8 6.1* 6.5 6.8  ALBUMIN 3.1* 3.3* 2.9* 3.1* 3.1*   No results for input(s): LIPASE, AMYLASE in the last 168 hours. No results for input(s): AMMONIA in the last 168 hours.  CBC: Recent Labs  Lab 04/05/20 0441 04/06/20 0454 04/07/20 0819 04/08/20 0519 04/09/20 0509  WBC 7.3 7.2 6.9 6.8 6.6  NEUTROABS 4.4 4.6 4.6 4.4 4.2  HGB 9.0* 9.3* 9.1* 8.9* 8.9*  HCT 31.8* 33.4* 32.0* 31.2* 30.0*  MCV 82.8 82.1 81.0 81.5 80.0  PLT 365 398 378 396 437*    Cardiac Enzymes: No results for input(s): CKTOTAL, CKMB, CKMBINDEX, TROPONINI in the last 168 hours.  BNP: BNP (last 3 results) Recent Labs    12/10/19 0057 02/13/20 0807 03/08/2020 2019  BNP 154.7* 111.6* 195.0*    ProBNP (last 3 results) No results for input(s): PROBNP in the last 8760 hours.    Other results:  Imaging: No results found.   Medications:  Scheduled Medications: . apixaban  5 mg Oral BID  . atorvastatin  40 mg Oral Daily  . Chlorhexidine Gluconate Cloth  6 each Topical Daily  . insulin aspart  0-20 Units Subcutaneous TID WC  . insulin aspart  5 Units Subcutaneous TID WC  . insulin glargine  22 Units Subcutaneous BID  . potassium chloride SA  40 mEq Oral BID  . sodium chloride flush  3 mL Intravenous Q12H  . spironolactone  12.5 mg Oral Daily  . torsemide  80 mg Oral BID    Infusions: . amiodarone 30 mg/hr (04/11/20 0159)  . ferumoxytol 510 mg (04/09/20 1654)  . milrinone 0.25 mcg/kg/min (04/11/20 0420)    PRN Medications: acetaminophen **OR** acetaminophen, [COMPLETED] diphenhydrAMINE **FOLLOWED BY** diphenhydrAMINE, hydrocortisone  cream, hydrOXYzine, ondansetron (ZOFRAN) IV, polyethylene glycol, sodium chloride flush   Assessment/Plan:   1.  Acute on chronic systolic heart failure - Echo (11/2018) EF 25-30%, trace MR, trivial TR, moderately reduced RSVF.   - Cath (12/2018) RA 10, RV 55/8, PA 54/15 (27), PCW = 16, Fick CO/CI = 7.0/2.8 - Echo (04/04/2020) EF 45-50%, mild LVH, hypokinesis LV, dilated LA, dilated RV/RA, mod MR, mod-severe TR, RVSP > 60 mmHg.  - Milrinone started on 1/29 for co-ox 48%.  - CO-OX 56% on milrinone 0.25 mcg.  - Volume status much improved. Overall weight weigh down 42 pounds. CVP 4 - Restart PO diuretics, torsemide 80 mg bid.  - Likely triggered by AF, monitor closely while on inotropes  - Holding Entresto and dig for now.  - Continue Spiro 12.5 mg  - Will try to wean off milrinone over the weekend.   2. Persistent AF - has been on amio at home - suspect has been in AF for several weeks. ECG in 12/21 was NSR - Back in NSR this morning.  - Continue IV amio while on milrinone - TFTs not too bad - continue Eliquis  3. Chronic respiratory failure/OSA - Continue with CPAP at night for OSA  4. CKD 3b - Baseline Cr 1.5-1.7 - SCr 1.67 today  - Continue milrinone support  5. Anemia/Hx of GIB - Hgb stable  9.8 on CO-OX   - EGD 12/2019 nonbleeding duodenal ulcer with gastritis - Received Feraheme 04/09/20   6. Venous stasis wounds L tib/fib, L heel & Sacral wound - Wound care consulted. Primary team managing   7. T2DM - A1c 12.4% recent hospitalization - SSI Per primary team - Would be concerned about SGT2i with mild erythema under pannus  8. Morbidly obese Body mass index is 45.19 kg/m.  9. Lung nodule - Will need outpatient imaging in 3 months, 9 mm CT (03/2020)  10. Hypokalemia/ Hypomagnesemia - K /Mag stable.  - Keep K> 4.0 Mg > 2.0   11. Hypervolemic Hyponatremia - improved, Na 137    Length of Stay: [place] PA-C  04/11/2020, 10:26 AM  Advanced  Heart Failure Team Pager [phone] (M-F; 7a - 4p)  Please contact [facility] for night-coverage after hours (4p -7a ) and weekends on [email or web address]  Patient seen and examined with the above-signed Advanced Practice Provider and/or Housestaff. I personally reviewed laboratory data, imaging studies and relevant notes. I independently examined the patient and formulated the important aspects of the plan. I have edited the note to reflect any of my changes or salient points. I have personally discussed the plan with the patient and/or family.   Remains on milrinone 0.25. Co-ox 56% CVP 4-5. Back in NSR. Back  on po torsemide. Creatinine improved  General:  Sitting on side of bed  No resp difficulty HEENT: normal Neck: supple. no JVD. Carotids 2+ bilat; no bruits. No lymphadenopathy or thryomegaly appreciated. Cor: PMI nondisplaced. Regular rate & rhythm. No rubs, gallops or murmurs. Lungs: decreased throguhout Abdomen: obese  soft, nontender, nondistended. No hepatosplenomegaly. No bruits or masses. Good bowel sounds. Extremities: no cyanosis, clubbing, rash, tr edema Neuro: alert & orientedx3, cranial nerves grossly intact. moves all 4 extremities w/o difficulty. Affect pleasant  Making slow progress. Weigh down > 40 pounds. Rhythm appears to be sinus tach with PACs. Wean milrinone to 0.125 and follow.   [name], MD  5:34 PM

## 2020-04-12 DIAGNOSIS — G4733 Obstructive sleep apnea (adult) (pediatric): Secondary | ICD-10-CM | POA: Diagnosis not present

## 2020-04-12 DIAGNOSIS — I4819 Other persistent atrial fibrillation: Secondary | ICD-10-CM | POA: Diagnosis not present

## 2020-04-12 DIAGNOSIS — J9601 Acute respiratory failure with hypoxia: Secondary | ICD-10-CM | POA: Diagnosis not present

## 2020-04-12 DIAGNOSIS — I4891 Unspecified atrial fibrillation: Secondary | ICD-10-CM | POA: Diagnosis not present

## 2020-04-12 DIAGNOSIS — Z9989 Dependence on other enabling machines and devices: Secondary | ICD-10-CM | POA: Diagnosis not present

## 2020-04-12 DIAGNOSIS — E785 Hyperlipidemia, unspecified: Secondary | ICD-10-CM | POA: Diagnosis not present

## 2020-04-12 DIAGNOSIS — I480 Paroxysmal atrial fibrillation: Secondary | ICD-10-CM | POA: Diagnosis not present

## 2020-04-12 DIAGNOSIS — E1169 Type 2 diabetes mellitus with other specified complication: Secondary | ICD-10-CM | POA: Diagnosis not present

## 2020-04-12 DIAGNOSIS — I10 Essential (primary) hypertension: Secondary | ICD-10-CM | POA: Diagnosis not present

## 2020-04-12 DIAGNOSIS — I5023 Acute on chronic systolic (congestive) heart failure: Secondary | ICD-10-CM | POA: Diagnosis not present

## 2020-04-12 LAB — BASIC METABOLIC PANEL
Anion gap: 11 (ref 5–15)
BUN: 31 mg/dL — ABNORMAL HIGH (ref 8–23)
CO2: 32 mmol/L (ref 22–32)
Calcium: 9 mg/dL (ref 8.9–10.3)
Chloride: 93 mmol/L — ABNORMAL LOW (ref 98–111)
Creatinine, Ser: 1.87 mg/dL — ABNORMAL HIGH (ref 0.61–1.24)
GFR, Estimated: 39 mL/min — ABNORMAL LOW (ref >60)
Glucose, Bld: 148 mg/dL — ABNORMAL HIGH (ref 70–99)
Potassium: 4.6 mmol/L (ref 3.5–5.1)
Sodium: 136 mmol/L (ref 135–145)

## 2020-04-12 LAB — COOXEMETRY PANEL
Carboxyhemoglobin: 1.8 % — ABNORMAL HIGH (ref 0.5–1.5)
Methemoglobin: 1.1 % (ref 0.0–1.5)
O2 Saturation: 35.4 %
Total hemoglobin: 10.6 g/dL — ABNORMAL LOW (ref 12.0–16.0)

## 2020-04-12 LAB — GLUCOSE, CAPILLARY
Glucose-Capillary: 163 mg/dL — ABNORMAL HIGH (ref 70–99)
Glucose-Capillary: 142 mg/dL — ABNORMAL HIGH (ref 70–99)
Glucose-Capillary: 136 mg/dL — ABNORMAL HIGH (ref 70–99)
Glucose-Capillary: 155 mg/dL — ABNORMAL HIGH (ref 70–99)

## 2020-04-12 MED ORDER — ALUM & MAG HYDROXIDE-SIMETH 200-200-20 MG/5ML PO SUSP
30.0000 mL | Freq: Four times a day (QID) | ORAL | Status: DC | PRN
  Administered 2020-04-12 – 2020-04-16 (×11): 30 mL via ORAL
  Filled 2020-04-12 (×12): qty 30

## 2020-04-12 NOTE — Progress Notes (Signed)
   04/11/20 2032  Assess: MEWS Score  Temp (!) 94.8 F (34.9 C)  BP (!) 131/116  Pulse Rate (!) 133  ECG Heart Rate (!) 133  Resp 17  SpO2 94 %  Assess: MEWS Score  MEWS Temp 2  MEWS Systolic 0  MEWS Pulse 3  MEWS RR 0  MEWS LOC 0  MEWS Score 5  MEWS Score Color Red  Assess: if the MEWS score is Yellow or Red  Were vital signs taken at a resting state? Yes  Focused Assessment No change from prior assessment  Early Detection of Sepsis Score *See Row Information* High  MEWS guidelines implemented *See Row Information* No, vital signs rechecked  Document  Patient Outcome Other (Comment) (no stabilization required)  Progress note created (see row info) Yes

## 2020-04-12 NOTE — Progress Notes (Signed)
PROGRESS NOTE    Raymond Pratt  [id number] DOB: 1951-01-20 DOA: 03/15/2020 PCP: Shon Baton, MD   Brief Narrative:  Patient is 70 year old male with past medical history of combined systolic and diastolic CHF, A. fib, anemia, cellulitis, GI bleed, hypertension, OSA on CPAP, diabetes, coronary artery disease present to emergency department with shortness of breath.  ED course: Patient was noted to be hypoxic requiring 4 L of oxygen, tachycardic, tachypneic, potassium of 5.3, creatinine: 1.38, BNP: 195, troponin: 20, chest x-ray showed cardiomegaly, pulmonary vascular congestion and perihilar edema.  Patient started on Lasix and cardiology was consulted.  Assessment & Plan:   Acute hypoxemic respiratory failure in the setting of acute on chronic systolic CHF: -Patient presented with signs of fluid overload.  BNP: 195.  On admission shows cardiac enlargement with pulmonary vascular congestion and perihilar edema.  Requiring 3 L of oxygen via nasal cannula -Echo showed ejection fraction of 45 to 50%, mild LVH, hypokinesis, dilated RV/RA, moderate MR, moderate to severe TR -due to worsening renal function-milrinone dose decreased  to 0.25-->0.125, switched IV Lasix to torsemide 80 mg twice daily as per cardiology on 2/3.  Continue Aldactone. -Continue to hold Entresto and digoxin for now -Strict INO's and daily weight and monitor signs of fluid overload -He is -18 L  and >40 pounds down since admission. -Appreciate cardiology's recommendation  Persistent A. Fib with RVR: -Continue with IV amiodarone, Eliquis -Cardiology is to plan TEE, DC-CV likely on  Monday once patient fluid status improves-appreciate cardiology's recommendation  AKI on CKD stage III A:  -Renal function declined this morning from creatinine 1.67-1.87, GFR 44-39. -Monitor renal function closely  Anemia of chronic disease/history of GI bleed: -H&H is currently stable.  Continue to monitor.  - transfuse if  hemoglobin less than 7  Uncontrolled type 2 diabetes mellitus: A1c 12.4%.  Continue Lantus,  sliding scale insulin and NovoLog 5 units 3 times daily with meals.  Monitor blood sugar closely  Chronic bilateral venous stasis: -Continue with wound care-no signs of infection.  Hypokalemia/hypomagnesemia: Replenished  Morbid obesity with BMI of 46: -Diet modification/exercise and weight loss recommended.  OSA: CPAP at nighttime  Lung nodule: Noted on CT scan.  Outpatient follow-up in 3 months.  Pressure injury: POA -Stage III left heel and right buttocks -Appreciate wound care recommendations  DVT prophylaxis: Eliquis Code Status: Full code Family Communication:  None present at bedside.  Plan of care discussed with patient in length and he verbalized understanding and agreed with it. Disposition Plan: To be determined  Consultants:   Cardiology  Procedures:   Echo  Antimicrobials:   None  Status is: Inpatient   Dispo: The patient is from: Home              Anticipated d/c is to: Home              Anticipated d/c date is: 3 days              Patient currently is not medically stable to d/c.   Difficult to place patient no         Subjective: Patient seen and examined.  Tells me that he feels thirsty and nauseated.  His shortness of breath and leg swelling is improving.  Denies chest pain, vomiting, lightheadedness, dizziness, orthopnea, PND, fever or chills or cough or congestion.  Objective: Vitals:   04/11/20 1630 04/11/20 2032 04/11/20 2051 04/12/20 0350  BP:  (!) 131/116 109/81 (!) 141/88  Pulse:  Marland Kitchen)  133 (!) 110 (!) 131  Resp:  '17 19 15  '$ Temp: 99.5 F (37.5 C) (!) 94.8 F (34.9 C) 99.5 F (37.5 C) 97.8 F (36.6 C)  TempSrc: Oral Oral Oral Oral  SpO2:  94% 92%   Weight:    128.9 kg  Height:        Intake/Output Summary (Last 24 hours) at 04/12/2020 0957 Last data filed at 04/11/2020 1831 Gross per 24 hour  Intake 120 ml  Output 1150 ml  Net  -1030 ml   Filed Weights   04/10/20 0826 04/11/20 0410 04/12/20 0350  Weight: 132.5 kg 130.9 kg 128.9 kg    Examination:  General exam: Appears calm and comfortable, morbidly obese, on nasal cannula, Respiratory system: No wheezing, rhonchi or crackles.. Cardiovascular system: S1 & S2 heard, RRR. No JVD, murmurs, rubs, gallops or clicks.  Trace pedal edema noted bilaterally. Gastrointestinal system: Abdomen is obese, soft and nontender. No organomegaly or masses felt. Normal bowel sounds heard. Central nervous system: Alert and oriented. No focal neurological deficits. Extremities: Symmetric 5 x 5 power. Skin: Bilateral chronic venous stasis ulcer with intact dressing noted in both lower extremities. Psychiatry: Judgement and insight appear normal. Mood & affect appropriate.    Data Reviewed: I have personally reviewed following labs and imaging studies  CBC: Recent Labs  Lab 04/06/20 0454 04/07/20 0819 04/08/20 0519 04/09/20 0509  WBC 7.2 6.9 6.8 6.6  NEUTROABS 4.6 4.6 4.4 4.2  HGB 9.3* 9.1* 8.9* 8.9*  HCT 33.4* 32.0* 31.2* 30.0*  MCV 82.1 81.0 81.5 80.0  PLT 398 378 396 [id number]*   Basic Metabolic Panel: Recent Labs  Lab 04/06/20 0454 04/07/20 0700 04/08/20 0519 04/09/20 0509 04/10/20 0349 04/11/20 0500 04/12/20 0550  NA 137 129* 136 134* 134* 137 136  K 3.7 3.4* 3.7 3.7 4.1 3.8 4.6  CL 95* 85* 92* 89* 90* 94* 93*  CO2 30 29 33* 34* 32 32 32  GLUCOSE 96 495* 161* 185* 354* 169* 148*  BUN 43* 42* 39* 36* 34* 32* 31*  CREATININE 1.51* 1.79* 1.60* 1.65* 1.98* 1.67* 1.87*  CALCIUM 9.0 8.0* 8.7* 8.8* 8.7* 8.8* 9.0  MG 2.1 1.8 2.2 2.4 2.5* 2.5*  --   PHOS 4.0 3.3 3.4 3.7  --   --   --    GFR: Estimated Creatinine Clearance: 48.8 mL/min (A) (by C-G formula based on SCr of 1.87 mg/dL (H)). Liver Function Tests: Recent Labs  Lab 04/06/20 0454 04/07/20 0700 04/08/20 0519 04/09/20 0509  AST '25 16 16 '$ 14*  ALT 44 32 28 25  ALKPHOS 82 78 73 72  BILITOT 1.0 0.8 1.1  1.2  PROT 6.8 6.1* 6.5 6.8  ALBUMIN 3.3* 2.9* 3.1* 3.1*   No results for input(s): LIPASE, AMYLASE in the last 168 hours. No results for input(s): AMMONIA in the last 168 hours. Coagulation Profile: No results for input(s): INR, PROTIME in the last 168 hours. Cardiac Enzymes: No results for input(s): CKTOTAL, CKMB, CKMBINDEX, TROPONINI in the last 168 hours. BNP (last 3 results) No results for input(s): PROBNP in the last 8760 hours. HbA1C: No results for input(s): HGBA1C in the last 72 hours. CBG: Recent Labs  Lab 04/11/20 0620 04/11/20 1126 04/11/20 1625 04/11/20 2053 04/12/20 0612  GLUCAP 270* 201* 125* 118* 163*   Lipid Profile: No results for input(s): CHOL, HDL, LDLCALC, TRIG, CHOLHDL, LDLDIRECT in the last 72 hours. Thyroid Function Tests: No results for input(s): TSH, T4TOTAL, FREET4, T3FREE, THYROIDAB in the last 72 hours. Anemia  Panel: No results for input(s): VITAMINB12, FOLATE, FERRITIN, TIBC, IRON, RETICCTPCT in the last 72 hours. Sepsis Labs: No results for input(s): PROCALCITON, LATICACIDVEN in the last 168 hours.  Recent Results (from the past 240 hour(s))  SARS Coronavirus 2 by RT PCR (hospital order, performed in [facility] hospital lab) Nasopharyngeal Nasopharyngeal Swab     Status: None   Collection Time: 04/06/2020  9:51 PM   Specimen: Nasopharyngeal Swab  Result Value Ref Range Status   SARS Coronavirus 2 NEGATIVE NEGATIVE Final    Comment: Performed at [facility], [place] [address], [place], [place] [address]      Radiology Studies: No results found.  Scheduled Meds: . apixaban  5 mg Oral BID  . atorvastatin  40 mg Oral Daily  . Chlorhexidine Gluconate Cloth  6 each Topical Daily  . insulin aspart  0-20 Units Subcutaneous TID WC  . insulin aspart  5 Units Subcutaneous TID WC  . insulin glargine  22 Units Subcutaneous BID  . potassium chloride SA  40 mEq Oral BID  . sodium chloride flush  3 mL Intravenous Q12H  . spironolactone  12.5 mg  Oral Daily  . torsemide  80 mg Oral BID   Continuous Infusions: . amiodarone 30 mg/hr (04/12/20 0031)  . ferumoxytol 510 mg (04/09/20 1654)  . milrinone 0.125 mcg/kg/min (04/12/20 0552)     LOS: 9 days   Time spent: 35 minutes   Tahnee Cifuentes Loann Quill, MD [facility]  If 7PM-7AM, please contact night-coverage [email or web address] 04/12/2020, 9:57 AM

## 2020-04-12 NOTE — TOC Progression Note (Addendum)
Transition of Care [facility]) - Progression Note    Patient Details  Name: Raymond Pratt MRN: [id number] Date of Birth: 06/25/1950  [facility]) CM/SW Contact  Zenon Mayo, RN Phone Number: 04/12/2020, 3:40 PM  Clinical Narrative:    NCM spoke with patient at bedside, he lives with his grandkids . He is active with [facility] for [facility], would like to continue with [facility], this was confirmed with [facility]  Patient has PCP, [facility] [phone].  They are working on orders thur [employer] for [employer] and wheelchair, this is confirmed with [facility] with [employer], but she states they called today and put the orders on hold.  Patient has a scale at home and weighs himself daily.  conts on iv lasix , was on entresto pta , now starting on [email or web address] pay for refills is 45.00 , this NCM informed patient. He states he does not have any issues with transportation or getting food.   Expected Discharge Plan: [place] Barriers to Discharge: Continued Medical Work up  Expected Discharge Plan and Services Expected Discharge Plan: [place]   Discharge Planning Services: CM Consult Post Acute Care Choice: [place] arrangements for the past 2 months: Single Family Home                   DME Agency: NA       HH Arranged: RN,PT [place] Agency: [place] Date [facility] Agency Contacted: 04/07/20 Time [place]: 1626 Representative spoke with at [place]: [place] (SDOH) Interventions Food Insecurity Interventions: Intervention Not Indicated Transportation Interventions: Intervention Not Indicated  Readmission Risk Interventions Readmission Risk Prevention Plan 04/07/2020 12/12/2019  Transportation Screening Complete Complete  PCP or Specialist Appt within 3-5 Days Complete Complete  HRI or Home Care Consult Complete -   Social Work Consult for [place] Planning/Counseling Complete -  Palliative Care Screening Not Applicable Not Applicable  Medication Review Press photographer) Complete -  Some recent data might be hidden

## 2020-04-12 NOTE — Progress Notes (Signed)
   04/12/20 1300  Assess: MEWS Score  Temp 100.2 F (37.9 C)  BP 100/73  Pulse Rate (!) 104  Resp (!) 22  SpO2 90 %  O2 Device Nasal Cannula  O2 Flow Rate (L/min) 3 L/min  Assess: MEWS Score  MEWS Temp 0  MEWS Systolic 1  MEWS Pulse 1  MEWS RR 1  MEWS LOC 0  MEWS Score 3  MEWS Score Color Yellow  Assess: if the MEWS score is Yellow or Red  Were vital signs taken at a resting state? Yes  Focused Assessment No change from prior assessment  Early Detection of Sepsis Score *See Row Information* Medium  MEWS guidelines implemented *See Row Information* No, previously yellow, continue vital signs every 4 hours  Document  Progress note created (see row info) Yes   Patient continues to have elevated HR and increased RR after ambulation. Amiodarone drip continued and patient helped to reposition in bed.

## 2020-04-12 NOTE — Progress Notes (Signed)
DAILY PROGRESS NOTE   Patient Name: Raymond Pratt Date of Encounter: 04/12/2020 Cardiologist: [name], MD  Chief Complaint   Somnolent  Patient Profile   Raymond Pratt is 70 y.o. M PMH: HF (EF 45-50%), afib (on coumadin), anemia, hx of GIB, HTN, OSA on CPA, DM, CAD presented from home for shortness of breath, weight gain and edema  Subjective   Somnolent today -Diuresed 880 ml negative overnight. Creatinine increased to 1.87 (from 1.67). Milrinone was weaned to 0.125 mcg/kg/min yesterday. On torsemide 80 mg BID. Weight down 2 kg overnight. Co-ox dropped to 35.4 (from 56.3).  Seems to be somnolent, more than described before.  Objective   Vitals:   04/11/20 1630 04/11/20 2032 04/11/20 2051 04/12/20 0350  BP:  (!) 131/116 109/81 (!) 141/88  Pulse:  (!) 133 (!) 110 (!) 131  Resp:  _0 Temp: 99.5 F (37.5 C) (!) 94.8 F (34.9 C) 99.5 F (37.5 C) 97.8 F (36.6 C)  TempSrc: Oral Oral Oral Oral  SpO2:  94% 92%   Weight:    128.9 kg  Height:        Intake/Output Summary (Last 24 hours) at 04/12/2020 1112 Last data filed at 04/11/2020 1831 Gross per 24 hour  Intake 120 ml  Output 1150 ml  Net -1030 ml   Filed Weights   04/10/20 0826 04/11/20 0410 04/12/20 0350  Weight: 132.5 kg 130.9 kg 128.9 kg    Physical Exam   General appearance: somnolent, arouses to stimuli Neck: JVD - a few cm above sternal notch, no carotid bruit and thyroid not enlarged, symmetric, no tenderness/mass/nodules Lungs: diminished breath sounds bibasilar Heart: regular tachycardia Abdomen: protuberant Extremities: edema 1+ bilateral LE edema Pulses: 2+ and symmetric Skin: Skin color, texture, turgor normal. No rashes or lesions Neurologic: Mental status: arouses to stimuli, somnolent Psych: Cannot asses  Inpatient Medications    Scheduled Meds: . apixaban  5 mg Oral BID  . atorvastatin  40 mg Oral Daily  . Chlorhexidine Gluconate Cloth  6 each Topical Daily   . insulin aspart  0-20 Units Subcutaneous TID WC  . insulin aspart  5 Units Subcutaneous TID WC  . insulin glargine  22 Units Subcutaneous BID  . potassium chloride SA  40 mEq Oral BID  . sodium chloride flush  3 mL Intravenous Q12H  . spironolactone  12.5 mg Oral Daily  . torsemide  80 mg Oral BID    Continuous Infusions: . amiodarone 30 mg/hr (04/12/20 0031)  . ferumoxytol 510 mg (04/09/20 1654)  . milrinone 0.125 mcg/kg/min (04/12/20 0552)    PRN Meds: acetaminophen **OR** acetaminophen, alum & mag hydroxide-simeth, [COMPLETED] diphenhydrAMINE **FOLLOWED BY** diphenhydrAMINE, hydrocortisone cream, hydrOXYzine, ondansetron (ZOFRAN) IV, polyethylene glycol, sodium chloride flush   Labs   Results for orders placed or performed during the hospital encounter of 03/31/2020 (from the past 48 hour(s))  Glucose, capillary     Status: Abnormal   Collection Time: 04/10/20  1:11 PM  Result Value Ref Range   Glucose-Capillary 275 (H) 70 - 99 mg/dL    Comment: Glucose reference range applies only to samples taken after fasting for at least 8 hours.  Glucose, capillary     Status: Abnormal   Collection Time: 04/10/20  4:43 PM  Result Value Ref Range   Glucose-Capillary 155 (H) 70 - 99 mg/dL    Comment: Glucose reference range applies only to samples taken after fasting for at least 8 hours.  Glucose, capillary  Status: Abnormal   Collection Time: 04/10/20  9:47 PM  Result Value Ref Range   Glucose-Capillary 253 (H) 70 - 99 mg/dL    Comment: Glucose reference range applies only to samples taken after fasting for at least 8 hours.   Comment 1 Notify RN    Comment 2 Document in Chart   Basic metabolic panel     Status: Abnormal   Collection Time: 04/11/20  5:00 AM  Result Value Ref Range   Sodium 137 135 - 145 mmol/L   Potassium 3.8 3.5 - 5.1 mmol/L   Chloride 94 (L) 98 - 111 mmol/L   CO2 32 22 - 32 mmol/L   Glucose, Bld 169 (H) 70 - 99 mg/dL    Comment: Glucose reference range  applies only to samples taken after fasting for at least 8 hours.   BUN 32 (H) 8 - 23 mg/dL   Creatinine, Ser 4.95 (H) 0.61 - 1.24 mg/dL   Calcium 8.8 (L) 8.9 - 10.3 mg/dL   GFR, Estimated 44 (L) >60 mL/min    Comment: (NOTE) Calculated using the CKD-EPI Creatinine Equation (2021)    Anion gap 11 5 - 15    Comment: Performed at [facility], [address], [place], [place] [address]  Magnesium     Status: Abnormal   Collection Time: 04/11/20  5:00 AM  Result Value Ref Range   Magnesium 2.5 (H) 1.7 - 2.4 mg/dL    Comment: Performed at [facility], [address], [place], [place] [address]  Glucose, capillary     Status: Abnormal   Collection Time: 04/11/20  6:20 AM  Result Value Ref Range   Glucose-Capillary 270 (H) 70 - 99 mg/dL    Comment: Glucose reference range applies only to samples taken after fasting for at least 8 hours.   Comment 1 Notify RN    Comment 2 Document in Chart   Cooxemetry Panel (carboxy, met, total hgb, O2 sat)     Status: Abnormal   Collection Time: 04/11/20  6:35 AM  Result Value Ref Range   Total hemoglobin 9.8 (L) 12.0 - 16.0 g/dL   O2 Saturation 05.1 %   Carboxyhemoglobin 1.7 (H) 0.5 - 1.5 %   Methemoglobin 1.3 0.0 - 1.5 %    Comment: Performed at [facility], [address] [place], [place] [address]  Glucose, capillary     Status: Abnormal   Collection Time: 04/11/20 11:26 AM  Result Value Ref Range   Glucose-Capillary 201 (H) 70 - 99 mg/dL    Comment: Glucose reference range applies only to samples taken after fasting for at least 8 hours.  Glucose, capillary     Status: Abnormal   Collection Time: 04/11/20  4:25 PM  Result Value Ref Range   Glucose-Capillary 125 (H) 70 - 99 mg/dL    Comment: Glucose reference range applies only to samples taken after fasting for at least 8 hours.  Glucose, capillary     Status: Abnormal   Collection Time: 04/11/20  8:53 PM  Result Value Ref Range   Glucose-Capillary 118 (H) 70 - 99 mg/dL     Comment: Glucose reference range applies only to samples taken after fasting for at least 8 hours.  Cooxemetry Panel (carboxy, met, total hgb, O2 sat)     Status: Abnormal   Collection Time: 04/12/20  5:50 AM  Result Value Ref Range   Total hemoglobin 10.6 (L) 12.0 - 16.0 g/dL   O2 Saturation  35.4 %   Carboxyhemoglobin 1.8 (H) 0.5 - 1.5 %   Methemoglobin 1.1 0.0 - 1.5 %    Comment: Performed at [facility] [address], [place], [place] [address]  Basic metabolic panel     Status: Abnormal   Collection Time: 04/12/20  5:50 AM  Result Value Ref Range   Sodium 136 135 - 145 mmol/L   Potassium 4.6 3.5 - 5.1 mmol/L   Chloride 93 (L) 98 - 111 mmol/L   CO2 32 22 - 32 mmol/L   Glucose, Bld 148 (H) 70 - 99 mg/dL    Comment: Glucose reference range applies only to samples taken after fasting for at least 8 hours.   BUN 31 (H) 8 - 23 mg/dL   Creatinine, Ser 1.87 (H) 0.61 - 1.24 mg/dL   Calcium 9.0 8.9 - 10.3 mg/dL   GFR, Estimated 39 (L) >60 mL/min    Comment: (NOTE) Calculated using the CKD-EPI Creatinine Equation (2021)    Anion gap 11 5 - 15    Comment: Performed at [facility] [address] [place], [place] [address]  Glucose, capillary     Status: Abnormal   Collection Time: 04/12/20  6:12 AM  Result Value Ref Range   Glucose-Capillary 163 (H) 70 - 99 mg/dL    Comment: Glucose reference range applies only to samples taken after fasting for at least 8 hours.  Glucose, capillary     Status: Abnormal   Collection Time: 04/12/20 11:10 AM  Result Value Ref Range   Glucose-Capillary 142 (H) 70 - 99 mg/dL    Comment: Glucose reference range applies only to samples taken after fasting for at least 8 hours.   Comment 1 Notify RN    Comment 2 Document in Chart     ECG   N/A - Personally Reviewed  Telemetry   Sinus rhythm - Personally Reviewed  Radiology    No results found.  Cardiac Studies   N/A  Assessment   1. Principal Problem: 2.   Acute  exacerbation of CHF (congestive heart failure) ([place]) 3. Active Problems: 4.   Type 2 diabetes mellitus with hyperlipidemia ([place]) 5.   OSA on CPAP 6.   Anticoagulated on Coumadin 7.   HTN (hypertension) 8.   A-fib ([place]) 9.   Anemia 10.   Atrial fibrillation with RVR ([place]) 11.   Pressure injury of skin 12.   Acute respiratory failure with hypoxia ([facility]) 13.   Plan   1. Creatinine worse overnight - his Co-ox is down significantly after decreasing his milrinone. Would increase back to 0.25 mcg/kg/min. Bp may allow further afterload reduction. Continue torsemide - monitor co-ox and daily weights. Eliquis and amiodarone for afib - maintaining sinus tachycardia.  Time Spent Directly with Patient:  I have spent a total of 35 minutes with the patient reviewing hospital notes, telemetry, EKGs, labs and examining the patient as well as establishing an assessment and plan that was discussed personally with the patient.  > 50% of time was spent in direct patient care.  Length of Stay:  LOS: 9 days   Pixie Casino, MD, [facility], [place] Director of the Advanced Lipid Disorders &  Cardiovascular Risk Reduction Clinic Diplomate of the American Board of Clinical Lipidology Attending Cardiologist  Direct Dial: [phone]  Fax: [phone]  Website:  [email or web address] 04/12/2020, 11:12 AM

## 2020-04-12 NOTE — Progress Notes (Signed)
   04/12/20 2309  Assess: MEWS Score  Temp 99.1 F (37.3 C)  BP 121/76  Pulse Rate (!) 117  Resp (!) 22  SpO2 93 %  O2 Device Nasal Cannula  O2 Flow Rate (L/min) 3 L/min  Assess: MEWS Score  MEWS Temp 0  MEWS Systolic 0  MEWS Pulse 2  MEWS RR 1  MEWS LOC 0  MEWS Score 3  MEWS Score Color Yellow  Assess: if the MEWS score is Yellow or Red  Were vital signs taken at a resting state? Yes  Focused Assessment No change from prior assessment  Early Detection of Sepsis Score *See Row Information* Low  MEWS guidelines implemented *See Row Information* No, previously yellow, continue vital signs every 4 hours  Document  Patient Outcome Stabilized after interventions  Progress note created (see row info) Yes

## 2020-04-12 NOTE — Progress Notes (Signed)
Patient refused linen change. Explained danger of moisture damage to already compromised skin. Patient stated "I'm not feeling up to it right now".

## 2020-04-12 NOTE — Progress Notes (Signed)
   04/12/20 1945  Assess: MEWS Score  Temp 99.6 F (37.6 C)  BP (!) 142/82  Pulse Rate (!) 123  Resp 20  SpO2 94 %  O2 Device Nasal Cannula  O2 Flow Rate (L/min) 3 L/min  Assess: MEWS Score  MEWS Temp 0  MEWS Systolic 0  MEWS Pulse 2  MEWS RR 0  MEWS LOC 0  MEWS Score 2  MEWS Score Color Yellow  Assess: if the MEWS score is Yellow or Red  Were vital signs taken at a resting state? Yes  Focused Assessment No change from prior assessment  Early Detection of Sepsis Score *See Row Information* Low  MEWS guidelines implemented *See Row Information* No, previously yellow, continue vital signs every 4 hours  Document  Patient Outcome Stabilized after interventions  Progress note created (see row info) Yes

## 2020-04-13 DIAGNOSIS — I4891 Unspecified atrial fibrillation: Secondary | ICD-10-CM | POA: Diagnosis not present

## 2020-04-13 DIAGNOSIS — Z9989 Dependence on other enabling machines and devices: Secondary | ICD-10-CM | POA: Diagnosis not present

## 2020-04-13 DIAGNOSIS — I10 Essential (primary) hypertension: Secondary | ICD-10-CM | POA: Diagnosis not present

## 2020-04-13 DIAGNOSIS — J9601 Acute respiratory failure with hypoxia: Secondary | ICD-10-CM | POA: Diagnosis not present

## 2020-04-13 DIAGNOSIS — E785 Hyperlipidemia, unspecified: Secondary | ICD-10-CM | POA: Diagnosis not present

## 2020-04-13 DIAGNOSIS — G4733 Obstructive sleep apnea (adult) (pediatric): Secondary | ICD-10-CM | POA: Diagnosis not present

## 2020-04-13 DIAGNOSIS — I5023 Acute on chronic systolic (congestive) heart failure: Secondary | ICD-10-CM | POA: Diagnosis not present

## 2020-04-13 DIAGNOSIS — E1169 Type 2 diabetes mellitus with other specified complication: Secondary | ICD-10-CM | POA: Diagnosis not present

## 2020-04-13 DIAGNOSIS — I4819 Other persistent atrial fibrillation: Secondary | ICD-10-CM | POA: Diagnosis not present

## 2020-04-13 DIAGNOSIS — I480 Paroxysmal atrial fibrillation: Secondary | ICD-10-CM | POA: Diagnosis not present

## 2020-04-13 LAB — GLUCOSE, CAPILLARY
Glucose-Capillary: 176 mg/dL — ABNORMAL HIGH (ref 70–99)
Glucose-Capillary: 282 mg/dL — ABNORMAL HIGH (ref 70–99)
Glucose-Capillary: 180 mg/dL — ABNORMAL HIGH (ref 70–99)
Glucose-Capillary: 274 mg/dL — ABNORMAL HIGH (ref 70–99)

## 2020-04-13 LAB — BASIC METABOLIC PANEL
Anion gap: 13 (ref 5–15)
BUN: 37 mg/dL — ABNORMAL HIGH (ref 8–23)
CO2: 31 mmol/L (ref 22–32)
Calcium: 8.4 mg/dL — ABNORMAL LOW (ref 8.9–10.3)
Chloride: 88 mmol/L — ABNORMAL LOW (ref 98–111)
Creatinine, Ser: 1.86 mg/dL — ABNORMAL HIGH (ref 0.61–1.24)
GFR, Estimated: 39 mL/min — ABNORMAL LOW (ref >60)
Glucose, Bld: 294 mg/dL — ABNORMAL HIGH (ref 70–99)
Potassium: 4.2 mmol/L (ref 3.5–5.1)
Sodium: 132 mmol/L — ABNORMAL LOW (ref 135–145)

## 2020-04-13 LAB — CBC
HCT: 33.4 % — ABNORMAL LOW (ref 39.0–52.0)
Hemoglobin: 9.5 g/dL — ABNORMAL LOW (ref 13.0–17.0)
MCH: 23.3 pg — ABNORMAL LOW (ref 26.0–34.0)
MCHC: 28.4 g/dL — ABNORMAL LOW (ref 30.0–36.0)
MCV: 82.1 fL (ref 80.0–100.0)
Platelets: 389 10*3/uL (ref 150–400)
RBC: 4.07 MIL/uL — ABNORMAL LOW (ref 4.22–5.81)
RDW: 25.1 % — ABNORMAL HIGH (ref 11.5–15.5)
WBC: 7.9 10*3/uL (ref 4.0–10.5)
nRBC: 0 % (ref 0.0–0.2)

## 2020-04-13 LAB — COOXEMETRY PANEL
Carboxyhemoglobin: 1.9 % — ABNORMAL HIGH (ref 0.5–1.5)
Methemoglobin: 1 % (ref 0.0–1.5)
O2 Saturation: 49.1 %
Total hemoglobin: 9.8 g/dL — ABNORMAL LOW (ref 12.0–16.0)

## 2020-04-13 LAB — MAGNESIUM: Magnesium: 2.3 mg/dL (ref 1.7–2.4)

## 2020-04-13 MED ORDER — MUSCLE RUB 10-15 % EX CREA
TOPICAL_CREAM | CUTANEOUS | Status: DC | PRN
  Filled 2020-04-13: qty 85

## 2020-04-13 MED ORDER — FAMOTIDINE IN NACL 20-0.9 MG/50ML-% IV SOLN
20.0000 mg | Freq: Every day | INTRAVENOUS | Status: DC
  Administered 2020-04-13 – 2020-04-15 (×3): 20 mg via INTRAVENOUS
  Filled 2020-04-13 (×4): qty 50

## 2020-04-13 NOTE — Progress Notes (Signed)
   04/13/20 0748  Assess: MEWS Score  Temp 98.1 F (36.7 C)  BP 124/86  Pulse Rate (!) 116  Resp 18  Level of Consciousness Alert  SpO2 95 %  O2 Device Nasal Cannula  Patient Activity (if Appropriate) In bed  O2 Flow Rate (L/min) 3 L/min  Assess: MEWS Score  MEWS Temp 0  MEWS Systolic 0  MEWS Pulse 2  MEWS RR 0  MEWS LOC 0  MEWS Score 2  MEWS Score Color Yellow  Assess: if the MEWS score is Yellow or Red  Were vital signs taken at a resting state? Yes  Focused Assessment No change from prior assessment  Early Detection of Sepsis Score *See Row Information* Medium  MEWS guidelines implemented *See Row Information* No, previously yellow, continue vital signs every 4 hours  Treat  MEWS Interventions Administered scheduled meds/treatments  Document  Progress note created (see row info) Yes   Patient continues to have elevated HR despite continuous IV Medications and oral medications. Will continue current medications unless new orders received.

## 2020-04-13 NOTE — Progress Notes (Signed)
DAILY PROGRESS NOTE   Patient Name: Raymond Pratt Date of Encounter: 04/13/2020 Cardiologist: [name], MD  Chief Complaint   Stomach hurts  Patient Profile   Raymond Pratt is 70 y.o. M PMH: HF (EF 45-50%), afib (on coumadin), anemia, hx of GIB, HTN, OSA on CPA, DM, CAD presented from home for shortness of breath, weight gain and edema  Subjective   Co-ox is better today at 66.1. creatinine stable at 1.86. He is net positive yesterday. On torsemide and lasix. C/o stomach ache - ?CHF related - apparently he has had incontinence and the I's/O's are not accurate. Appears to be in afib with RVR.  Objective   Vitals:   04/12/20 2309 04/13/20 0528 04/13/20 0600 04/13/20 0748  BP: 121/76 (!) 129/38  124/86  Pulse: (!) 117 (!) 108  (!) 116  Resp: (!) $RemoveB'22 18  18  'pgIIfSuw$ Temp: 99.1 F (37.3 C) 98.5 F (36.9 C)  98.1 F (36.7 C)  TempSrc: Oral Oral  Oral  SpO2: 93% 98%  95%  Weight:   130.4 kg   Height:        Intake/Output Summary (Last 24 hours) at 04/13/2020 1202 Last data filed at 04/13/2020 1023 Gross per 24 hour  Intake 2160.67 ml  Output 1625 ml  Net 535.67 ml   Filed Weights   04/11/20 0410 04/12/20 0350 04/13/20 0600  Weight: 130.9 kg 128.9 kg 130.4 kg    Physical Exam   General appearance: alert, mild distress and pleasant Neck: JVD - a few cm above sternal notch, no carotid bruit and thyroid not enlarged, symmetric, no tenderness/mass/nodules Lungs: diminished breath sounds bibasilar Heart: irregularly irregular rhythm and tachycardic Abdomen: protuberant, mild TTP Extremities: edema 1+ bilateral LE edema Pulses: 2+ and symmetric Skin: Skin color, texture, turgor normal. No rashes or lesions Neurologic: Grossly normal Psych: Pleasant  Inpatient Medications    Scheduled Meds: . apixaban  5 mg Oral BID  . atorvastatin  40 mg Oral Daily  . Chlorhexidine Gluconate Cloth  6 each Topical Daily  . insulin aspart  0-20 Units Subcutaneous TID  WC  . insulin aspart  5 Units Subcutaneous TID WC  . insulin glargine  22 Units Subcutaneous BID  . potassium chloride SA  40 mEq Oral BID  . sodium chloride flush  3 mL Intravenous Q12H  . spironolactone  12.5 mg Oral Daily  . torsemide  80 mg Oral BID    Continuous Infusions: . amiodarone 30 mg/hr (04/13/20 1045)  . ferumoxytol 510 mg (04/09/20 1654)  . milrinone 0.25 mcg/kg/min (04/13/20 0329)    PRN Meds: acetaminophen **OR** acetaminophen, alum & mag hydroxide-simeth, [COMPLETED] diphenhydrAMINE **FOLLOWED BY** diphenhydrAMINE, hydrocortisone cream, hydrOXYzine, ondansetron (ZOFRAN) IV, polyethylene glycol, sodium chloride flush   Labs   Results for orders placed or performed during the hospital encounter of 03/31/2020 (from the past 48 hour(s))  Glucose, capillary     Status: Abnormal   Collection Time: 04/11/20  4:25 PM  Result Value Ref Range   Glucose-Capillary 125 (H) 70 - 99 mg/dL    Comment: Glucose reference range applies only to samples taken after fasting for at least 8 hours.  Glucose, capillary     Status: Abnormal   Collection Time: 04/11/20  8:53 PM  Result Value Ref Range   Glucose-Capillary 118 (H) 70 - 99 mg/dL    Comment: Glucose reference range applies only to samples taken after fasting for at least 8 hours.  Cooxemetry Panel (carboxy, met, total hgb, O2  sat)     Status: Abnormal   Collection Time: 04/12/20  5:50 AM  Result Value Ref Range   Total hemoglobin 10.6 (L) 12.0 - 16.0 g/dL   O2 Saturation 35.4 %   Carboxyhemoglobin 1.8 (H) 0.5 - 1.5 %   Methemoglobin 1.1 0.0 - 1.5 %    Comment: Performed at [facility] [address], [place], [place] [address]  Basic metabolic panel     Status: Abnormal   Collection Time: 04/12/20  5:50 AM  Result Value Ref Range   Sodium 136 135 - 145 mmol/L   Potassium 4.6 3.5 - 5.1 mmol/L   Chloride 93 (L) 98 - 111 mmol/L   CO2 32 22 - 32 mmol/L   Glucose, Bld 148 (H) 70 - 99 mg/dL    Comment: Glucose  reference range applies only to samples taken after fasting for at least 8 hours.   BUN 31 (H) 8 - 23 mg/dL   Creatinine, Ser 1.87 (H) 0.61 - 1.24 mg/dL   Calcium 9.0 8.9 - 10.3 mg/dL   GFR, Estimated 39 (L) >60 mL/min    Comment: (NOTE) Calculated using the CKD-EPI Creatinine Equation (2021)    Anion gap 11 5 - 15    Comment: Performed at [facility] [address] [place], [place] [address]  Glucose, capillary     Status: Abnormal   Collection Time: 04/12/20  6:12 AM  Result Value Ref Range   Glucose-Capillary 163 (H) 70 - 99 mg/dL    Comment: Glucose reference range applies only to samples taken after fasting for at least 8 hours.  Glucose, capillary     Status: Abnormal   Collection Time: 04/12/20 11:10 AM  Result Value Ref Range   Glucose-Capillary 142 (H) 70 - 99 mg/dL    Comment: Glucose reference range applies only to samples taken after fasting for at least 8 hours.   Comment 1 Notify RN    Comment 2 Document in Chart   Glucose, capillary     Status: Abnormal   Collection Time: 04/12/20  3:50 PM  Result Value Ref Range   Glucose-Capillary 136 (H) 70 - 99 mg/dL    Comment: Glucose reference range applies only to samples taken after fasting for at least 8 hours.   Comment 1 Notify RN    Comment 2 Document in Chart   Glucose, capillary     Status: Abnormal   Collection Time: 04/12/20 10:53 PM  Result Value Ref Range   Glucose-Capillary 155 (H) 70 - 99 mg/dL    Comment: Glucose reference range applies only to samples taken after fasting for at least 8 hours.   Comment 1 Notify RN    Comment 2 Document in Chart   Glucose, capillary     Status: Abnormal   Collection Time: 04/13/20  5:50 AM  Result Value Ref Range   Glucose-Capillary 176 (H) 70 - 99 mg/dL    Comment: Glucose reference range applies only to samples taken after fasting for at least 8 hours.  CBC     Status: Abnormal   Collection Time: 04/13/20  6:23 AM  Result Value Ref Range   WBC 7.9 4.0 - 10.5  K/uL   RBC 4.07 (L) 4.22 - 5.81 MIL/uL   Hemoglobin 9.5 (L) 13.0 - 17.0 g/dL   HCT 33.4 (L) 39.0 - 52.0 %   MCV 82.1 80.0 - 100.0 fL   MCH 23.3 (L) 26.0 - 34.0 pg   MCHC 28.4 (  L) 30.0 - 36.0 g/dL   RDW 25.1 (H) 11.5 - 15.5 %   Platelets 389 150 - 400 K/uL   nRBC 0.0 0.0 - 0.2 %    Comment: Performed at [facility], [place] [address] [place], [place] [address]  Basic metabolic panel     Status: Abnormal   Collection Time: 04/13/20  6:23 AM  Result Value Ref Range   Sodium 132 (L) 135 - 145 mmol/L   Potassium 4.2 3.5 - 5.1 mmol/L   Chloride 88 (L) 98 - 111 mmol/L   CO2 31 22 - 32 mmol/L   Glucose, Bld 294 (H) 70 - 99 mg/dL    Comment: Glucose reference range applies only to samples taken after fasting for at least 8 hours.   BUN 37 (H) 8 - 23 mg/dL   Creatinine, Ser 1.86 (H) 0.61 - 1.24 mg/dL   Calcium 8.4 (L) 8.9 - 10.3 mg/dL   GFR, Estimated 39 (L) >60 mL/min    Comment: (NOTE) Calculated using the CKD-EPI Creatinine Equation (2021)    Anion gap 13 5 - 15    Comment: Performed at [facility] [address] [place], [place] [address]  Magnesium     Status: None   Collection Time: 04/13/20  6:23 AM  Result Value Ref Range   Magnesium 2.3 1.7 - 2.4 mg/dL    Comment: Performed at [facility] [address] [place], [place] [address]  Cooxemetry Panel (carboxy, met, total hgb, O2 sat)     Status: Abnormal   Collection Time: 04/13/20  6:32 AM  Result Value Ref Range   Total hemoglobin 9.8 (L) 12.0 - 16.0 g/dL   O2 Saturation 49.1 %   Carboxyhemoglobin 1.9 (H) 0.5 - 1.5 %   Methemoglobin 1.0 0.0 - 1.5 %    Comment: Performed at [facility] [address] [place], [place] [address]    ECG   N/A - Personally Reviewed  Telemetry   AFib with RVR- Personally Reviewed  Radiology    No results found.  Cardiac Studies   N/A  Assessment   Principal Problem:   Acute exacerbation of CHF (congestive heart failure) ([facility]) Active Problems:    Type 2 diabetes mellitus with hyperlipidemia ([facility])   OSA on CPAP   Anticoagulated on Coumadin   HTN (hypertension)   A-fib ([facility])   Anemia   Atrial fibrillation with RVR ([facility])   Pressure injury of skin   Acute respiratory failure with hypoxia ([place])   Plan   1. Creatinine stable - co-ox improved with increasing milrinone. Continue torsemide and aldactone - was up yesterday, buy may improve today on increased dose milrinone. Has been incontinent - main complaint is belly pain today - decreased appetite. Hospitalist aware - he is receiving maalox. In afib with RVR - still on IV amiodarone - rhythm was more regular yesterday, ?if it was flutter- may be a role for DCCV next week.   Time Spent Directly with Patient:  I have spent a total of 35 minutes with the patient reviewing hospital notes, telemetry, EKGs, labs and examining the patient as well as establishing an assessment and plan that was discussed personally with the patient.  > 50% of time was spent in direct patient care.  Length of Stay:  LOS: 10 days   Pixie Casino, MD, [facility], [place] Director of the Advanced Lipid Disorders &  Cardiovascular Risk  Reduction Clinic Diplomate of the American Board of Clinical Lipidology Attending Cardiologist  Direct Dial: [phone]  Fax: [phone]  Website:  [email or web address] 04/13/2020, 12:02 PM

## 2020-04-13 NOTE — Progress Notes (Signed)
Patient refused to wear CPAP tonight. Pt on [place] with stable VS.

## 2020-04-13 NOTE — Progress Notes (Signed)
PROGRESS NOTE    Raymond Pratt  [id number] DOB: 11-21-1950 DOA: 03/14/2020 PCP: Shon Baton, MD   Brief Narrative:  Patient is 70 year old male with past medical history of combined systolic and diastolic CHF, A. fib, anemia, cellulitis, GI bleed, hypertension, OSA on CPAP, diabetes, coronary artery disease present to emergency department with shortness of breath.  ED course: Patient was noted to be hypoxic requiring 4 L of oxygen, tachycardic, tachypneic, potassium of 5.3, creatinine: 1.38, BNP: 195, troponin: 20, chest x-ray showed cardiomegaly, pulmonary vascular congestion and perihilar edema.  Patient started on Lasix and cardiology was consulted.  Assessment & Plan:   Acute hypoxemic respiratory failure in the setting of acute on chronic systolic CHF: -Patient presented with signs of fluid overload.  BNP: 195.  On admission shows cardiac enlargement with pulmonary vascular congestion and perihilar edema.  Requiring 3 L of oxygen via nasal cannula -Echo showed ejection fraction of 45 to 50%, mild LVH, hypokinesis, dilated RV/RA, moderate MR, moderate to severe TR -Continue milrinone, torsemide 80 mg twice daily and Aldactone -Continue to hold Entresto and digoxin for now -Strict INO's and daily weight.  Keep potassium more than 4, magnesium: More than 2 -He is -17 L  and >40 pounds down since admission. -Appreciate cardiology's recommendation  Persistent A. Fib with RVR: -Continue with IV amiodarone, Eliquis -Cardiology is to plan TEE, DC-CV likely on  Monday once patient fluid status improves-appreciate cardiology's recommendation  AKI on CKD stage III A:  -Renal function: same  creatinine1.87, GFR 39. -Monitor renal function closely in the setting of aggressive diuresis.  Anemia of chronic disease/history of GI bleed: -H&H is currently stable.  Continue to monitor.  - transfuse if hemoglobin less than 7  Uncontrolled type 2 diabetes mellitus: A1c 12.4%.   Continue Lantus,  sliding scale insulin and NovoLog 5 units 3 times daily with meals.  Monitor blood sugar closely  Chronic bilateral venous stasis: -Continue with wound care-no signs of infection.  Hypokalemia/hypomagnesemia: Replenished  Morbid obesity with BMI of 46: -Diet modification/exercise and weight loss recommended.  OSA: CPAP at nighttime  Lung nodule: Noted on CT scan.  Outpatient follow-up in 3 months.  Pressure injury: POA -Stage III left heel and right buttocks -Appreciate wound care recommendations  DVT prophylaxis: Eliquis Code Status: Full code Family Communication:  None present at bedside.  Plan of care discussed with patient in length and he verbalized understanding and agreed with it. Disposition Plan: To be determined  Consultants:   Cardiology  Procedures:   Echo  Antimicrobials:   None  Status is: Inpatient   Dispo: The patient is from: Home              Anticipated d/c is to: Home              Anticipated d/c date is: 3 days              Patient currently is not medically stable to d/c.   Difficult to place patient no         Subjective: Patient seen and examined.  Resting comfortably on the bed.  Tells me that he still feels sick out of his stomach however his breathing and leg swelling has improved.  Remained afebrile, no acute events overnight, denies chest pain, vomiting, headache or blurry vision.  Objective: Vitals:   04/12/20 2309 04/13/20 0528 04/13/20 0600 04/13/20 0748  BP: 121/76 (!) 129/38  124/86  Pulse: (!) 117 (!) 108  (!) 116  Resp: (!) '22 18  18  '$ Temp: 99.1 F (37.3 C) 98.5 F (36.9 C)  98.1 F (36.7 C)  TempSrc: Oral Oral  Oral  SpO2: 93% 98%  95%  Weight:   130.4 kg   Height:        Intake/Output Summary (Last 24 hours) at 04/13/2020 0919 Last data filed at 04/13/2020 0900 Gross per 24 hour  Intake 2126.44 ml  Output 1625 ml  Net 501.44 ml   Filed Weights   04/11/20 0410 04/12/20 0350 04/13/20 0600   Weight: 130.9 kg 128.9 kg 130.4 kg    Examination:  General exam: Appears calm and comfortable, morbidly obese, on nasal cannula, Respiratory system: No wheezing, rhonchi or crackles.. Cardiovascular system: Tachycardic, RRR. No JVD, murmurs, rubs, gallops or clicks.  Trace pedal edema noted bilaterally. Gastrointestinal system: Abdomen is obese, soft and nontender. No organomegaly or masses felt. Normal bowel sounds heard. Central nervous system: Alert and oriented. No focal neurological deficits. Extremities: Symmetric 5 x 5 power. Skin: Bilateral chronic venous stasis ulcer with intact dressing noted in both lower extremities. Psychiatry: Judgement and insight appear normal. Mood & affect appropriate.    Data Reviewed: I have personally reviewed following labs and imaging studies  CBC: Recent Labs  Lab 04/07/20 0819 04/08/20 0519 04/09/20 0509 04/13/20 0623  WBC 6.9 6.8 6.6 7.9  NEUTROABS 4.6 4.4 4.2  --   HGB 9.1* 8.9* 8.9* 9.5*  HCT 32.0* 31.2* 30.0* 33.4*  MCV 81.0 81.5 80.0 82.1  PLT 378 396 437* [id number]   Basic Metabolic Panel: Recent Labs  Lab 04/07/20 0700 04/08/20 0519 04/09/20 0509 04/10/20 0349 04/11/20 0500 04/12/20 0550 04/13/20 0623  NA 129* 136 134* 134* 137 136 132*  K 3.4* 3.7 3.7 4.1 3.8 4.6 4.2  CL 85* 92* 89* 90* 94* 93* 88*  CO2 29 33* 34* 32 32 32 31  GLUCOSE 495* 161* 185* 354* 169* 148* 294*  BUN 42* 39* 36* 34* 32* 31* 37*  CREATININE 1.79* 1.60* 1.65* 1.98* 1.67* 1.87* 1.86*  CALCIUM 8.0* 8.7* 8.8* 8.7* 8.8* 9.0 8.4*  MG 1.8 2.2 2.4 2.5* 2.5*  --  2.3  PHOS 3.3 3.4 3.7  --   --   --   --    GFR: Estimated Creatinine Clearance: 49.4 mL/min (A) (by C-G formula based on SCr of 1.86 mg/dL (H)). Liver Function Tests: Recent Labs  Lab 04/07/20 0700 04/08/20 0519 04/09/20 0509  AST 16 16 14*  ALT 32 28 25  ALKPHOS 78 73 72  BILITOT 0.8 1.1 1.2  PROT 6.1* 6.5 6.8  ALBUMIN 2.9* 3.1* 3.1*   No results for input(s): LIPASE, AMYLASE in  the last 168 hours. No results for input(s): AMMONIA in the last 168 hours. Coagulation Profile: No results for input(s): INR, PROTIME in the last 168 hours. Cardiac Enzymes: No results for input(s): CKTOTAL, CKMB, CKMBINDEX, TROPONINI in the last 168 hours. BNP (last 3 results) No results for input(s): PROBNP in the last 8760 hours. HbA1C: No results for input(s): HGBA1C in the last 72 hours. CBG: Recent Labs  Lab 04/12/20 0612 04/12/20 1110 04/12/20 1550 04/12/20 2253 04/13/20 0550  GLUCAP 163* 142* 136* 155* 176*   Lipid Profile: No results for input(s): CHOL, HDL, LDLCALC, TRIG, CHOLHDL, LDLDIRECT in the last 72 hours. Thyroid Function Tests: No results for input(s): TSH, T4TOTAL, FREET4, T3FREE, THYROIDAB in the last 72 hours. Anemia Panel: No results for input(s): VITAMINB12, FOLATE, FERRITIN, TIBC, IRON, RETICCTPCT in the last 72 hours.  Sepsis Labs: No results for input(s): PROCALCITON, LATICACIDVEN in the last 168 hours.  Recent Results (from the past 240 hour(s))  SARS Coronavirus 2 by RT PCR (hospital order, performed in [facility] hospital lab) Nasopharyngeal Nasopharyngeal Swab     Status: None   Collection Time: 03/08/2020  9:51 PM   Specimen: Nasopharyngeal Swab  Result Value Ref Range Status   SARS Coronavirus 2 NEGATIVE NEGATIVE Final    Comment: Performed at [facility], [place] [address], [place], [place] [address]      Radiology Studies: No results found.  Scheduled Meds: . apixaban  5 mg Oral BID  . atorvastatin  40 mg Oral Daily  . Chlorhexidine Gluconate Cloth  6 each Topical Daily  . insulin aspart  0-20 Units Subcutaneous TID WC  . insulin aspart  5 Units Subcutaneous TID WC  . insulin glargine  22 Units Subcutaneous BID  . potassium chloride SA  40 mEq Oral BID  . sodium chloride flush  3 mL Intravenous Q12H  . spironolactone  12.5 mg Oral Daily  . torsemide  80 mg Oral BID   Continuous Infusions: . amiodarone 30 mg/hr (04/12/20 2302)   . ferumoxytol 510 mg (04/09/20 1654)  . milrinone 0.25 mcg/kg/min (04/13/20 0329)     LOS: 10 days   Time spent: 35 minutes   Cavan Bearden Loann Quill, MD [facility]  If 7PM-7AM, please contact night-coverage [email or web address] 04/13/2020, 9:19 AM

## 2020-04-13 NOTE — Progress Notes (Signed)
Patient making attempts to use the urinal but is having frequent issues with it spilling or not working appropriately. Will continue to document urine output as accurately as possible.

## 2020-04-14 ENCOUNTER — Inpatient Hospital Stay (HOSPITAL_COMMUNITY): Payer: Medicare HMO

## 2020-04-14 DIAGNOSIS — J189 Pneumonia, unspecified organism: Secondary | ICD-10-CM | POA: Diagnosis not present

## 2020-04-14 DIAGNOSIS — I5043 Acute on chronic combined systolic (congestive) and diastolic (congestive) heart failure: Secondary | ICD-10-CM | POA: Diagnosis not present

## 2020-04-14 DIAGNOSIS — J811 Chronic pulmonary edema: Secondary | ICD-10-CM | POA: Diagnosis not present

## 2020-04-14 DIAGNOSIS — A4150 Gram-negative sepsis, unspecified: Secondary | ICD-10-CM | POA: Diagnosis not present

## 2020-04-14 DIAGNOSIS — I4891 Unspecified atrial fibrillation: Secondary | ICD-10-CM | POA: Diagnosis not present

## 2020-04-14 DIAGNOSIS — I10 Essential (primary) hypertension: Secondary | ICD-10-CM | POA: Diagnosis not present

## 2020-04-14 DIAGNOSIS — I5023 Acute on chronic systolic (congestive) heart failure: Secondary | ICD-10-CM | POA: Diagnosis not present

## 2020-04-14 DIAGNOSIS — J9601 Acute respiratory failure with hypoxia: Secondary | ICD-10-CM | POA: Diagnosis not present

## 2020-04-14 DIAGNOSIS — I517 Cardiomegaly: Secondary | ICD-10-CM | POA: Diagnosis not present

## 2020-04-14 DIAGNOSIS — I132 Hypertensive heart and chronic kidney disease with heart failure and with stage 5 chronic kidney disease, or end stage renal disease: Secondary | ICD-10-CM | POA: Diagnosis not present

## 2020-04-14 DIAGNOSIS — Z515 Encounter for palliative care: Secondary | ICD-10-CM | POA: Diagnosis not present

## 2020-04-14 DIAGNOSIS — E1169 Type 2 diabetes mellitus with other specified complication: Secondary | ICD-10-CM | POA: Diagnosis not present

## 2020-04-14 DIAGNOSIS — I4819 Other persistent atrial fibrillation: Secondary | ICD-10-CM | POA: Diagnosis not present

## 2020-04-14 DIAGNOSIS — I480 Paroxysmal atrial fibrillation: Secondary | ICD-10-CM | POA: Diagnosis not present

## 2020-04-14 DIAGNOSIS — E785 Hyperlipidemia, unspecified: Secondary | ICD-10-CM | POA: Diagnosis not present

## 2020-04-14 DIAGNOSIS — Z20822 Contact with and (suspected) exposure to covid-19: Secondary | ICD-10-CM | POA: Diagnosis not present

## 2020-04-14 LAB — URINALYSIS, ROUTINE W REFLEX MICROSCOPIC
Bilirubin Urine: NEGATIVE
Glucose, UA: NEGATIVE mg/dL
Ketones, ur: NEGATIVE mg/dL
Leukocytes,Ua: NEGATIVE
Nitrite: NEGATIVE
Protein, ur: 30 mg/dL — AB
Specific Gravity, Urine: 1.01 (ref 1.005–1.030)
pH: 5.5 (ref 5.0–8.0)

## 2020-04-14 LAB — CBC WITH DIFFERENTIAL/PLATELET
Abs Immature Granulocytes: 0.06 10*3/uL (ref 0.00–0.07)
Basophils Absolute: 0 10*3/uL (ref 0.0–0.1)
Basophils Relative: 0 %
Eosinophils Absolute: 0 10*3/uL (ref 0.0–0.5)
Eosinophils Relative: 0 %
HCT: 34.7 % — ABNORMAL LOW (ref 39.0–52.0)
Hemoglobin: 10 g/dL — ABNORMAL LOW (ref 13.0–17.0)
Immature Granulocytes: 1 %
Lymphocytes Relative: 8 %
Lymphs Abs: 0.7 10*3/uL (ref 0.7–4.0)
MCH: 23.8 pg — ABNORMAL LOW (ref 26.0–34.0)
MCHC: 28.8 g/dL — ABNORMAL LOW (ref 30.0–36.0)
MCV: 82.4 fL (ref 80.0–100.0)
Monocytes Absolute: 1 10*3/uL (ref 0.1–1.0)
Monocytes Relative: 12 %
Neutro Abs: 6.5 10*3/uL (ref 1.7–7.7)
Neutrophils Relative %: 79 %
Platelets: 399 10*3/uL (ref 150–400)
RBC: 4.21 MIL/uL — ABNORMAL LOW (ref 4.22–5.81)
RDW: 24.8 % — ABNORMAL HIGH (ref 11.5–15.5)
WBC: 8.3 10*3/uL (ref 4.0–10.5)
nRBC: 0 % (ref 0.0–0.2)

## 2020-04-14 LAB — URINALYSIS, MICROSCOPIC (REFLEX): Squamous Epithelial / HPF: NONE SEEN (ref 0–5)

## 2020-04-14 LAB — GLUCOSE, CAPILLARY
Glucose-Capillary: 218 mg/dL — ABNORMAL HIGH (ref 70–99)
Glucose-Capillary: 228 mg/dL — ABNORMAL HIGH (ref 70–99)
Glucose-Capillary: 261 mg/dL — ABNORMAL HIGH (ref 70–99)
Glucose-Capillary: 183 mg/dL — ABNORMAL HIGH (ref 70–99)

## 2020-04-14 LAB — COOXEMETRY PANEL
Carboxyhemoglobin: 1.5 % (ref 0.5–1.5)
Methemoglobin: 0.9 % (ref 0.0–1.5)
O2 Saturation: 53 %
Total hemoglobin: 9.6 g/dL — ABNORMAL LOW (ref 12.0–16.0)

## 2020-04-14 LAB — COMPREHENSIVE METABOLIC PANEL
ALT: 18 U/L (ref 0–44)
AST: 22 U/L (ref 15–41)
Albumin: 2.8 g/dL — ABNORMAL LOW (ref 3.5–5.0)
Alkaline Phosphatase: 61 U/L (ref 38–126)
Anion gap: 13 (ref 5–15)
BUN: 41 mg/dL — ABNORMAL HIGH (ref 8–23)
CO2: 31 mmol/L (ref 22–32)
Calcium: 8.5 mg/dL — ABNORMAL LOW (ref 8.9–10.3)
Chloride: 86 mmol/L — ABNORMAL LOW (ref 98–111)
Creatinine, Ser: 1.68 mg/dL — ABNORMAL HIGH (ref 0.61–1.24)
GFR, Estimated: 44 mL/min — ABNORMAL LOW (ref >60)
Glucose, Bld: 333 mg/dL — ABNORMAL HIGH (ref 70–99)
Potassium: 4.4 mmol/L (ref 3.5–5.1)
Sodium: 130 mmol/L — ABNORMAL LOW (ref 135–145)
Total Bilirubin: 0.8 mg/dL (ref 0.3–1.2)
Total Protein: 6.9 g/dL (ref 6.5–8.1)

## 2020-04-14 LAB — LACTIC ACID, PLASMA
Lactic Acid, Venous: 2.2 mmol/L (ref 0.5–1.9)
Lactic Acid, Venous: 1.2 mmol/L (ref 0.5–1.9)

## 2020-04-14 LAB — OSMOLALITY: Osmolality: 294 mOsm/kg (ref 275–295)

## 2020-04-14 LAB — PROCALCITONIN: Procalcitonin: 1.38 ng/mL

## 2020-04-14 LAB — SODIUM, URINE, RANDOM: Sodium, Ur: <10 mmol/L

## 2020-04-14 MED ORDER — VANCOMYCIN HCL 10 G IV SOLR
2500.0000 mg | Freq: Once | INTRAVENOUS | Status: DC
  Administered 2020-04-14: 2500 mg via INTRAVENOUS
  Filled 2020-04-14: qty 2500

## 2020-04-14 MED ORDER — INSULIN GLARGINE 100 UNIT/ML ~~LOC~~ SOLN
25.0000 [IU] | Freq: Two times a day (BID) | SUBCUTANEOUS | Status: DC
  Administered 2020-04-14 – 2020-04-15 (×2): 25 [IU] via SUBCUTANEOUS
  Filled 2020-04-14 (×3): qty 0.25

## 2020-04-14 MED ORDER — VANCOMYCIN HCL 10 G IV SOLR
2500.0000 mg | Freq: Once | INTRAVENOUS | Status: AC
  Filled 2020-04-14: qty 2500

## 2020-04-14 MED ORDER — VANCOMYCIN HCL 1250 MG/250ML IV SOLN: 1250.0000 mg | INTRAVENOUS | Status: DC

## 2020-04-14 MED ORDER — METOCLOPRAMIDE HCL 5 MG/5ML PO SOLN
10.0000 mg | Freq: Three times a day (TID) | ORAL | Status: DC
  Filled 2020-04-14: qty 10

## 2020-04-14 MED ORDER — AMIODARONE HCL IN DEXTROSE 360-4.14 MG/200ML-% IV SOLN
30.0000 mg/h | INTRAVENOUS | Status: DC
  Administered 2020-04-14 – 2020-04-19 (×19): 60 mg/h via INTRAVENOUS
  Administered 2020-04-19 – 2020-04-21 (×5): 30 mg/h via INTRAVENOUS
  Filled 2020-04-14 (×24): qty 200

## 2020-04-14 MED ORDER — AMIODARONE IV BOLUS ONLY 150 MG/100ML
150.0000 mg | Freq: Once | INTRAVENOUS | Status: AC
  Administered 2020-04-14: 150 mg via INTRAVENOUS
  Filled 2020-04-14: qty 100

## 2020-04-14 MED ORDER — METOCLOPRAMIDE HCL 5 MG PO TABS
10.0000 mg | ORAL_TABLET | Freq: Three times a day (TID) | ORAL | Status: DC
  Administered 2020-04-14 – 2020-04-17 (×13): 10 mg via ORAL
  Filled 2020-04-14 (×13): qty 2

## 2020-04-14 MED ORDER — FUROSEMIDE 10 MG/ML IJ SOLN
80.0000 mg | Freq: Two times a day (BID) | INTRAMUSCULAR | Status: DC
  Administered 2020-04-14: 80 mg via INTRAVENOUS
  Filled 2020-04-14 (×2): qty 8

## 2020-04-14 MED ORDER — PIPERACILLIN-TAZOBACTAM 3.375 G IVPB
3.3750 g | Freq: Three times a day (TID) | INTRAVENOUS | Status: DC
  Administered 2020-04-14 (×2): 3.375 g via INTRAVENOUS
  Filled 2020-04-14: qty 50

## 2020-04-14 NOTE — Progress Notes (Signed)
   04/13/20 1900  Assess: MEWS Score  Temp 98.6 F (37 C)  BP 100/73  Pulse Rate (!) 121  SpO2 96 %  O2 Device Nasal Cannula  O2 Flow Rate (L/min) 2.5 L/min  Assess: MEWS Score  MEWS Temp 0  MEWS Systolic 1  MEWS Pulse 2  MEWS RR 0  MEWS LOC 0  MEWS Score 3  MEWS Score Color Yellow  Assess: if the MEWS score is Yellow or Red  Were vital signs taken at a resting state? Yes  Focused Assessment No change from prior assessment  Early Detection of Sepsis Score *See Row Information* Low  MEWS guidelines implemented *See Row Information* No, previously yellow, continue vital signs every 4 hours  Document  Patient Outcome Stabilized after interventions  Progress note created (see row info) Yes

## 2020-04-14 NOTE — Progress Notes (Signed)
Pharmacy Antibiotic Note  Raymond Pratt is a 70 y.o. male admitted on 03/27/2020 with shortness of breath.  Pharmacy has been consulted for vancomycin and zosyn dosing for suspected bacteremia.   Patient febrile to 102, wbc normal at 8.3, scr elevated at 1.68. Will send blood and urine cultures. Patient does have a picc 92 days old and multiple leg wounds.   Vancomycin 2.5g now then 1250 mg IV Q 24 hrs. Goal AUC 400-550. Expected AUC: 515 SCr used: 1.68   Plan: Vancomycin 2.5g now then 1250 mg IV Q 24 hrs. Goal AUC 400-550. Check levels depending on culture data and renal function Zosyn 3.375g IV q8 hours  Height: '5\' 7"'$  (170.2 cm) Weight: 131 kg (288 lb 11.2 oz) IBW/kg (Calculated) : 66.1  Temp (24hrs), Avg:99.4 F (37.4 C), Min:98.1 F (36.7 C), Max:102 F (38.9 C)  Recent Labs  Lab 04/08/20 0519 04/09/20 0509 04/10/20 0349 04/11/20 0500 04/12/20 0550 04/13/20 0623 04/14/20 0443 04/14/20 1433  WBC 6.8 6.6  --   --   --  7.9  --  8.3  CREATININE 1.60* 1.65* 1.98* 1.67* 1.87* 1.86* 1.68*  --   LATICACIDVEN  --   --   --   --   --   --   --  2.2*    Estimated Creatinine Clearance: 54.8 mL/min (A) (by C-G formula based on SCr of 1.68 mg/dL (H)).    Allergies  Allergen Reactions  . Pioglitazone Other (See Comments)    Made the patient retain fluid    Thank you for allowing pharmacy to be a part of this patient's care.  Erin Hearing PharmD., BCPS Clinical Pharmacist 04/14/2020 5:41 PM

## 2020-04-14 NOTE — Progress Notes (Signed)
   04/14/20 1000  Therapy Vitals  BP 126/75  Mobility  Activity Contraindicated/medical hold (Hold per RN due to elevated HR 135+)

## 2020-04-14 NOTE — Progress Notes (Signed)
   04/14/20 0400  Assess: MEWS Score  Temp 99.9 F (37.7 C)  BP 127/83  Pulse Rate (!) 121  SpO2 96 %  O2 Device Nasal Cannula  O2 Flow Rate (L/min) 3 L/min  Assess: MEWS Score  MEWS Temp 0  MEWS Systolic 0  MEWS Pulse 2  MEWS RR 0  MEWS LOC 0  MEWS Score 2  MEWS Score Color Yellow  Assess: if the MEWS score is Yellow or Red  Were vital signs taken at a resting state? Yes  Focused Assessment No change from prior assessment  Early Detection of Sepsis Score *See Row Information* Low  MEWS guidelines implemented *See Row Information* No, previously yellow, continue vital signs every 4 hours  Document  Patient Outcome Stabilized after interventions  Progress note created (see row info) Yes

## 2020-04-14 NOTE — Progress Notes (Addendum)
Pt request MD come to bedside, specifically, Amy, [facility] team; expressing concerns of not feeling well. Pt remains A/Ox4. HR sustaining 130-140's in A  Fib; Amio bolus given this morning as well as rate adjustment made per [facility]; Pt also febrile with temp of 102.0; Tylenol given per order as well.  Paged Heart Failure pager; Brittney returned paged; stated would come check on pt shortly.   1557: Lactic acid of 2.2. MD notified

## 2020-04-14 NOTE — Progress Notes (Signed)
PT Cancellation Note  Patient Details Name: Jerrick Colglazier MRN: [id number] DOB: 1951-02-15   Cancelled Treatment:    Reason Eval/Treat Not Completed: Medical issues which prohibited therapy (discussed with RN; holding today due to elevated HR up to 146 bpm at rest).  Wyona Almas, PT, DPT Acute Rehabilitation Services Pager [phone] Office [phone]    Deno Etienne 04/14/2020, 1:04 PM

## 2020-04-14 NOTE — Progress Notes (Addendum)
PROGRESS NOTE    Raymond Pratt  [id number] DOB: 1950-05-12 DOA: 04/02/2020 PCP: Shon Baton, MD   Brief Narrative:  Patient is 70 year old male with past medical history of combined systolic and diastolic CHF, A. fib, anemia, cellulitis, GI bleed, hypertension, OSA on CPAP, diabetes, coronary artery disease present to emergency department with shortness of breath.  ED course: Patient was noted to be hypoxic requiring 4 L of oxygen, tachycardic, tachypneic, potassium of 5.3, creatinine: 1.38, BNP: 195, troponin: 20, chest x-ray showed cardiomegaly, pulmonary vascular congestion and perihilar edema.  Patient started on Lasix and cardiology was consulted.  Assessment & Plan:   Acute hypoxemic respiratory failure in the setting of acute on chronic systolic CHF: -Patient presented with signs of fluid overload.  BNP: 195.  On admission shows cardiac enlargement with pulmonary vascular congestion and perihilar edema.  Requiring 3 L of oxygen via nasal cannula -Echo showed ejection fraction of 45 to 50%, mild LVH, hypokinesis, dilated RV/RA, moderate MR, moderate to severe TR -Continue milrinone and Aldactone, switched torsemide to IV Lasix milligram twice daily -Continue to hold Entresto and digoxin for now -Strict INO's and daily weight.  Keep potassium more than 4, magnesium: More than 2 -He is -17 L  and >40 pounds down since admission. -Appreciate cardiology's recommendation  Persistent A. Fib with RVR: -Patient continues to be in RVR-amiodarone 50 mg bolus given and increase amiodarone to 60 mg/h-per cardiology.  Appreciate help -Continue Eliquis  Left lower lobe pneumonia: -Patient developed high-grade fever of 102.  Chest x-ray obtained which showed patchy left lung opacity superimposed on interstitial prominence and pulmonary vascular congestion.  Asymmetric pulmonary edema and pneumonias are considerations -Blood culture ordered and is pending.  Check procalcitonin  level -No increased oxygen requirement at this time.  We will start patient on Zosyn empirically.  Persistent nausea: -Likely diabetic gastropathy -We will add Reglan scheduled and continue Zofran as needed -Continue IV Pepcid and Maalox  Hyponatremia: -Sodium 130 this morning.  Likely secondary to diuretics. -Monitor closely.  Will check urine sodium and serum osmolality.  AKI on CKD stage III A:  -Renal function: Improving creatinine: 1.68, GFR: 44 -Monitor renal function closely in the setting of aggressive diuresis.  Anemia of chronic disease/history of GI bleed: -H&H is currently stable.  Continue to monitor.  - transfuse if hemoglobin less than 7  Uncontrolled type 2 diabetes mellitus: A1c 12.4%.  Continue Lantus,  sliding scale insulin and NovoLog 5 units 3 times daily with meals.  Monitor blood sugar closely  Chronic bilateral venous stasis: -Continue with wound care-no signs of infection.  Hypokalemia/hypomagnesemia: Replenished  Morbid obesity with BMI of 46: -Diet modification/exercise and weight loss recommended.  OSA: CPAP at nighttime  Lung nodule: Noted on CT scan.  Outpatient follow-up in 3 months.  Pressure injury: POA -Stage III left heel and right buttocks -Appreciate wound care recommendations  DVT prophylaxis: Eliquis Code Status: Full code Family Communication:  None present at bedside.  Plan of care discussed with patient in length and he verbalized understanding and agreed with it. Disposition Plan: To be determined  Consultants:   Cardiology  Procedures:   Echo  Antimicrobials:   None  Status is: Inpatient   Dispo: The patient is from: Home              Anticipated d/c is to: Home              Anticipated d/c date is: 3 days  Patient currently is not medically stable to d/c.   Difficult to place patient no         Subjective: Patient seen and examined.  Tells me that he continues to have nausea and not  feeling good overall.  Denies chest pain, worsening shortness of breath or leg swelling.  No acute events overnight.  Objective: Vitals:   04/13/20 2300 04/14/20 0400 04/14/20 0500 04/14/20 0700  BP: 131/86 127/83    Pulse: (!) 121 (!) 121    Resp: 20 (!) 24    Temp: 99.8 F (37.7 C) 99.9 F (37.7 C) 99.9 F (37.7 C) 98.1 F (36.7 C)  TempSrc: Oral Oral Oral Oral  SpO2: 94% 96%    Weight:  131 kg    Height:        Intake/Output Summary (Last 24 hours) at 04/14/2020 0914 Last data filed at 04/14/2020 0700 Gross per 24 hour  Intake 1417.17 ml  Output 1876 ml  Net -458.83 ml   Filed Weights   04/12/20 0350 04/13/20 0600 04/14/20 0400  Weight: 128.9 kg 130.4 kg 131 kg    Examination:  General exam: Appears calm and comfortable, morbidly obese, on nasal cannula, appears weak and sick Respiratory system: No wheezing, rhonchi or crackles.. Cardiovascular system: Tachycardic, RRR. No JVD, murmurs, rubs, gallops or clicks.  Trace pedal edema noted bilaterally. Gastrointestinal system: Abdomen is obese, soft and nontender. No organomegaly or masses felt. Normal bowel sounds heard. Central nervous system: Alert and oriented. No focal neurological deficits. Extremities: Symmetric 5 x 5 power. Skin: Bilateral chronic venous stasis ulcer with intact dressing noted in both lower extremities. Psychiatry: Judgement and insight appear normal. Mood & affect appropriate.    Data Reviewed: I have personally reviewed following labs and imaging studies  CBC: Recent Labs  Lab 04/08/20 0519 04/09/20 0509 04/13/20 0623  WBC 6.8 6.6 7.9  NEUTROABS 4.4 4.2  --   HGB 8.9* 8.9* 9.5*  HCT 31.2* 30.0* 33.4*  MCV 81.5 80.0 82.1  PLT 396 437* [id number]   Basic Metabolic Panel: Recent Labs  Lab 04/08/20 0519 04/09/20 0509 04/10/20 0349 04/11/20 0500 04/12/20 0550 04/13/20 0623 04/14/20 0443  NA 136 134* 134* 137 136 132* 130*  K 3.7 3.7 4.1 3.8 4.6 4.2 4.4  CL 92* 89* 90* 94* 93* 88* 86*   CO2 33* 34* 32 32 32 31 31  GLUCOSE 161* 185* 354* 169* 148* 294* 333*  BUN 39* 36* 34* 32* 31* 37* 41*  CREATININE 1.60* 1.65* 1.98* 1.67* 1.87* 1.86* 1.68*  CALCIUM 8.7* 8.8* 8.7* 8.8* 9.0 8.4* 8.5*  MG 2.2 2.4 2.5* 2.5*  --  2.3  --   PHOS 3.4 3.7  --   --   --   --   --    GFR: Estimated Creatinine Clearance: 54.8 mL/min (A) (by C-G formula based on SCr of 1.68 mg/dL (H)). Liver Function Tests: Recent Labs  Lab 04/08/20 0519 04/09/20 0509 04/14/20 0443  AST 16 14* 22  ALT '28 25 18  '$ ALKPHOS 73 72 61  BILITOT 1.1 1.2 0.8  PROT 6.5 6.8 6.9  ALBUMIN 3.1* 3.1* 2.8*   No results for input(s): LIPASE, AMYLASE in the last 168 hours. No results for input(s): AMMONIA in the last 168 hours. Coagulation Profile: No results for input(s): INR, PROTIME in the last 168 hours. Cardiac Enzymes: No results for input(s): CKTOTAL, CKMB, CKMBINDEX, TROPONINI in the last 168 hours. BNP (last 3 results) No results for input(s): PROBNP in the  last 8760 hours. HbA1C: No results for input(s): HGBA1C in the last 72 hours. CBG: Recent Labs  Lab 04/13/20 0550 04/13/20 1103 04/13/20 1556 04/13/20 2222 04/14/20 0530  GLUCAP 176* 282* 180* 274* 218*   Lipid Profile: No results for input(s): CHOL, HDL, LDLCALC, TRIG, CHOLHDL, LDLDIRECT in the last 72 hours. Thyroid Function Tests: No results for input(s): TSH, T4TOTAL, FREET4, T3FREE, THYROIDAB in the last 72 hours. Anemia Panel: No results for input(s): VITAMINB12, FOLATE, FERRITIN, TIBC, IRON, RETICCTPCT in the last 72 hours. Sepsis Labs: No results for input(s): PROCALCITON, LATICACIDVEN in the last 168 hours.  No results found for this or any previous visit (from the past 240 hour(s)).    Radiology Studies: No results found.  Scheduled Meds: . apixaban  5 mg Oral BID  . atorvastatin  40 mg Oral Daily  . Chlorhexidine Gluconate Cloth  6 each Topical Daily  . furosemide  80 mg Intravenous BID  . insulin aspart  0-20 Units  Subcutaneous TID WC  . insulin aspart  5 Units Subcutaneous TID WC  . insulin glargine  22 Units Subcutaneous BID  . metoCLOPramide  10 mg Oral TID AC & HS  . potassium chloride SA  40 mEq Oral BID  . sodium chloride flush  3 mL Intravenous Q12H  . spironolactone  12.5 mg Oral Daily   Continuous Infusions: . amiodarone 60 mg/hr (04/14/20 0829)  . amiodarone    . famotidine (PEPCID) IV 20 mg (04/14/20 0901)  . ferumoxytol 510 mg (04/09/20 1654)  . milrinone 0.25 mcg/kg/min (04/13/20 2241)     LOS: 11 days   Time spent: 35 minutes   Shaddai Shapley Loann Quill, MD [facility]  If 7PM-7AM, please contact night-coverage [email or web address] 04/14/2020, 9:14 AM

## 2020-04-14 NOTE — Progress Notes (Signed)
Pt states he is not going to wear cpap d/t not being able to breathe while wearing it.  RT removed device at this time but left his mask and circuit and explained he could request again at anytime.  RT will continue to monitor.

## 2020-04-14 NOTE — Progress Notes (Addendum)
Advanced Heart Failure Rounding Note   Subjective:    Over the weekend milrinone increased to 0.25 mcg due to low CO-OX. Todays CO-OX  53%.  Went back in A fib. Continues on amio drip.   Low grade temp. Had chills overnight.   Feels terrible. Complaining of nausea.        Objective:   Weight Range:  Vital Signs:   Temp:  [98.1 F (36.7 C)-99.9 F (37.7 C)] 98.1 F (36.7 C) (02/07 0700) Pulse Rate:  [121] 121 (02/07 0400) Resp:  [20-24] 24 (02/07 0400) BP: (100-131)/(73-88) 127/83 (02/07 0400) SpO2:  [94 %-96 %] 96 % (02/07 0400) Weight:  [131 kg] 131 kg (02/07 0400) Last BM Date: 04/10/20  Weight change: Filed Weights   04/12/20 0350 04/13/20 0600 04/14/20 0400  Weight: 128.9 kg 130.4 kg 131 kg    Intake/Output:   Intake/Output Summary (Last 24 hours) at 04/14/2020 0759 Last data filed at 04/14/2020 0700 Gross per 24 hour  Intake 1777.17 ml  Output 1876 ml  Net -98.83 ml  CVP  17-18  PHYSICAL EXAM: General:  Appears chronically ill.  No resp difficulty HEENT: normal Neck: supple. JVP to jaw . Carotids 2+ bilat; no bruits. No lymphadenopathy or thryomegaly appreciated. Cor: PMI nondisplaced. Irregular rate & rhythm. No rubs, gallops or murmurs. Lungs: clear Abdomen: soft, nontender, distended. No hepatosplenomegaly. No bruits or masses. Good bowel sounds. Extremities: no cyanosis, clubbing, rash, R and LLE 1+  edema. RUE PICC  Neuro: alert & orientedx3, cranial nerves grossly intact. moves all 4 extremities w/o difficulty. Affect pleasant  Telemetry:  A fib 120-130s  Labs: Basic Metabolic Panel: Recent Labs  Lab 04/08/20 0519 04/09/20 0509 04/10/20 0349 04/11/20 0500 04/12/20 0550 04/13/20 0623 04/14/20 0443  NA 136 134* 134* 137 136 132* 130*  K 3.7 3.7 4.1 3.8 4.6 4.2 4.4  CL 92* 89* 90* 94* 93* 88* 86*  CO2 33* 34* 32 32 32 31 31  GLUCOSE 161* 185* 354* 169* 148* 294* 333*  BUN 39* 36* 34* 32* 31* 37* 41*  CREATININE 1.60* 1.65* 1.98*  1.67* 1.87* 1.86* 1.68*  CALCIUM 8.7* 8.8* 8.7* 8.8* 9.0 8.4* 8.5*  MG 2.2 2.4 2.5* 2.5*  --  2.3  --   PHOS 3.4 3.7  --   --   --   --   --     Liver Function Tests: Recent Labs  Lab 04/08/20 0519 04/09/20 0509 04/14/20 0443  AST 16 14* 22  ALT '28 25 18  '$ ALKPHOS 73 72 61  BILITOT 1.1 1.2 0.8  PROT 6.5 6.8 6.9  ALBUMIN 3.1* 3.1* 2.8*   No results for input(s): LIPASE, AMYLASE in the last 168 hours. No results for input(s): AMMONIA in the last 168 hours.  CBC: Recent Labs  Lab 04/07/20 0819 04/08/20 0519 04/09/20 0509 04/13/20 0623  WBC 6.9 6.8 6.6 7.9  NEUTROABS 4.6 4.4 4.2  --   HGB 9.1* 8.9* 8.9* 9.5*  HCT 32.0* 31.2* 30.0* 33.4*  MCV 81.0 81.5 80.0 82.1  PLT 378 396 437* 389    Cardiac Enzymes: No results for input(s): CKTOTAL, CKMB, CKMBINDEX, TROPONINI in the last 168 hours.  BNP: BNP (last 3 results) Recent Labs    12/10/19 0057 02/13/20 0807 04/01/2020 2019  BNP 154.7* 111.6* 195.0*    ProBNP (last 3 results) No results for input(s): PROBNP in the last 8760 hours.    Other results:  Imaging: No results found.   Medications:  Scheduled Medications: . apixaban  5 mg Oral BID  . atorvastatin  40 mg Oral Daily  . Chlorhexidine Gluconate Cloth  6 each Topical Daily  . insulin aspart  0-20 Units Subcutaneous TID WC  . insulin aspart  5 Units Subcutaneous TID WC  . insulin glargine  22 Units Subcutaneous BID  . potassium chloride SA  40 mEq Oral BID  . sodium chloride flush  3 mL Intravenous Q12H  . spironolactone  12.5 mg Oral Daily  . torsemide  80 mg Oral BID    Infusions: . amiodarone 30 mg/hr (04/13/20 2241)  . famotidine (PEPCID) IV 20 mg (04/13/20 1404)  . ferumoxytol 510 mg (04/09/20 1654)  . milrinone 0.25 mcg/kg/min (04/13/20 2241)    PRN Medications: acetaminophen **OR** acetaminophen, alum & mag hydroxide-simeth, [COMPLETED] diphenhydrAMINE **FOLLOWED BY** diphenhydrAMINE, hydrocortisone cream, hydrOXYzine, Muscle Rub,  ondansetron (ZOFRAN) IV, polyethylene glycol, sodium chloride flush   Assessment/Plan:   1.  Acute on chronic systolic heart failure - Echo (11/2018) EF 25-30%, trace MR, trivial TR, moderately reduced RSVF.   - Cath (12/2018) RA 10, RV 55/8, PA 54/15 (27), PCW = 16, Fick CO/CI = 7.0/2.8 - Echo (04/04/2020) EF 45-50%, mild LVH, hypokinesis LV, dilated LA, dilated RV/RA, mod MR, mod-severe TR, RVSP > 60 mmHg.  - Milrinone started on 1/29 for low CO-OX.  - Over the weekend milrinone was increased 0.25 mcg. CO-OX 53%.  - CVP back up to 17 suspect due to A fib.  -  Stop torsemide and restart IV lasix 80 mg twice a day.   - Likely triggered by AF, monitor closely while on inotropes  - Holding Entresto and dig for now.  - Continue Spiro 12.5 mg    2. Persistent AF - has been on amio at home - suspect has been in AF for several weeks. ECG in 12/21 was NSR - Last week he went back in NSR but over the weekend back in A fib RVR.  - Give amio  150 mg bolus and increase amio to 60 mg per hour.   - TFTs not too bad - continue Eliquis  3. Chronic respiratory failure/OSA - Continue with CPAP at night for OSA - He has been refusing CPAP.   4. CKD 3b - Baseline Cr 1.5-1.7 - SCr 1.7 today  - Continue milrinone support  5. Anemia/Hx of GIB - Hgb stable  9.8 on CO-OX   - EGD 12/2019 nonbleeding duodenal ulcer with gastritis - Received Feraheme 04/09/20   6. Venous stasis wounds L tib/fib, L heel & Sacral wound - Wound care consulted. Primary team managing   7. T2DM - A1c 12.4% recent hospitalization - SSI Per primary team - Would be concerned about SGT2i with mild erythema under pannus  8. Morbidly obese Body mass index is 45.22 kg/m.  9. Lung nodule - Will need outpatient imaging in 3 months, 9 mm CT (03/2020)  10. Hypokalemia/ Hypomagnesemia -K /Mag stable.  - Keep K> 4.0 Mg > 2.0   11. Hyponatremia - Sodium 130 today.   12. ID -Low grade temp -Check CBC - check UA -  check blood cultures.    Length of Stay: [place] NP-C  04/14/2020, 7:59 AM  Advanced Heart Failure Team Pager [phone] (M-F; 7a - 4p)  Please contact [facility] for night-coverage after hours (4p -7a ) and weekends on [email or web address]  Patient seen and examined with the above-signed Advanced Practice Provider and/or Housestaff. I personally reviewed laboratory data, imaging  studies and relevant notes. I independently examined the patient and formulated the important aspects of the plan. I have edited the note to reflect any of my changes or salient points. I have personally discussed the plan with the patient and/or family.  He is back on milrinone at 0.25 due to recurrent low output over the weekend. Also back in AF.   Appears to have gotten septic over the last 24 hours with fever, elevated PCT and lactic acidosis.   Cultures drawn and Zosyn ordered.   General:  Lying in bed with chills HEENT: normal Neck: supple. JVP to jaw Carotids 2+ bilat; no bruits. No lymphadenopathy or thryomegaly appreciated. Cor: PMI nondisplaced. Irregular tachy . No rubs, gallops or murmurs. Lungs: clear Abdomen: obese soft, nontender, nondistended. No hepatosplenomegaly. No bruits or masses. Good bowel sounds. Extremities: no cyanosis, clubbing, rash, tr edema Neuro: alert & orientedx3, cranial nerves grossly intact. moves all 4 extremities w/o difficulty. Affect pleasant  Appears to have early sepsis. CXR suspicious for left-sided PNA. Cx drawn. Hold lasix. Continue zosyn. Add vanc for now. BP currently 130. Watch closely.   [name], MD  7:10 PM

## 2020-04-14 NOTE — Progress Notes (Signed)
   04/13/20 2300  Assess: MEWS Score  Temp 99.8 F (37.7 C)  BP 131/86  Pulse Rate (!) 121  Resp 20  SpO2 94 %  O2 Device Nasal Cannula  O2 Flow Rate (L/min) 2.5 L/min  Assess: MEWS Score  MEWS Temp 0  MEWS Systolic 0  MEWS Pulse 2  MEWS RR 0  MEWS LOC 0  MEWS Score 2  MEWS Score Color Yellow  Assess: if the MEWS score is Yellow or Red  Were vital signs taken at a resting state? Yes  Focused Assessment No change from prior assessment  Early Detection of Sepsis Score *See Row Information* Low  MEWS guidelines implemented *See Row Information* No, previously yellow, continue vital signs every 4 hours  Document  Patient Outcome Stabilized after interventions  Progress note created (see row info) Yes

## 2020-04-14 NOTE — Progress Notes (Signed)
Attempted to see pt for OT today. Pt with HR sustained in 130s.  Nursing asked for pt not to be seen today for treatment. Will attempt back as schedule allows. Jinger Neighbors, [place] [phone]

## 2020-04-14 NOTE — Plan of Care (Signed)
  Problem: Pain Managment: Goal: General experience of comfort will improve Outcome: Completed/Met

## 2020-04-15 DIAGNOSIS — J189 Pneumonia, unspecified organism: Secondary | ICD-10-CM

## 2020-04-15 DIAGNOSIS — I480 Paroxysmal atrial fibrillation: Secondary | ICD-10-CM | POA: Diagnosis not present

## 2020-04-15 DIAGNOSIS — J9601 Acute respiratory failure with hypoxia: Secondary | ICD-10-CM | POA: Diagnosis not present

## 2020-04-15 DIAGNOSIS — B9689 Other specified bacterial agents as the cause of diseases classified elsewhere: Secondary | ICD-10-CM

## 2020-04-15 DIAGNOSIS — I5023 Acute on chronic systolic (congestive) heart failure: Secondary | ICD-10-CM | POA: Diagnosis not present

## 2020-04-15 DIAGNOSIS — I10 Essential (primary) hypertension: Secondary | ICD-10-CM | POA: Diagnosis not present

## 2020-04-15 DIAGNOSIS — A4150 Gram-negative sepsis, unspecified: Secondary | ICD-10-CM

## 2020-04-15 DIAGNOSIS — D6489 Other specified anemias: Secondary | ICD-10-CM | POA: Diagnosis not present

## 2020-04-15 DIAGNOSIS — R7881 Bacteremia: Secondary | ICD-10-CM

## 2020-04-15 LAB — BLOOD CULTURE ID PANEL (REFLEXED) - BCID2

## 2020-04-15 LAB — COMPREHENSIVE METABOLIC PANEL
ALT: 22 U/L (ref 0–44)
AST: 28 U/L (ref 15–41)
Albumin: 2.7 g/dL — ABNORMAL LOW (ref 3.5–5.0)
Alkaline Phosphatase: 71 U/L (ref 38–126)
Anion gap: 11 (ref 5–15)
BUN: 46 mg/dL — ABNORMAL HIGH (ref 8–23)
CO2: 29 mmol/L (ref 22–32)
Calcium: 8.4 mg/dL — ABNORMAL LOW (ref 8.9–10.3)
Chloride: 89 mmol/L — ABNORMAL LOW (ref 98–111)
Creatinine, Ser: 2.07 mg/dL — ABNORMAL HIGH (ref 0.61–1.24)
GFR, Estimated: 34 mL/min — ABNORMAL LOW (ref >60)
Glucose, Bld: 215 mg/dL — ABNORMAL HIGH (ref 70–99)
Potassium: 4.8 mmol/L (ref 3.5–5.1)
Sodium: 129 mmol/L — ABNORMAL LOW (ref 135–145)
Total Bilirubin: 1 mg/dL (ref 0.3–1.2)
Total Protein: 6.4 g/dL — ABNORMAL LOW (ref 6.5–8.1)

## 2020-04-15 LAB — CBC
HCT: 28.1 % — ABNORMAL LOW (ref 39.0–52.0)
Hemoglobin: 8.5 g/dL — ABNORMAL LOW (ref 13.0–17.0)
MCH: 24.2 pg — ABNORMAL LOW (ref 26.0–34.0)
MCHC: 30.2 g/dL (ref 30.0–36.0)
MCV: 80.1 fL (ref 80.0–100.0)
Platelets: 341 10*3/uL (ref 150–400)
RBC: 3.51 MIL/uL — ABNORMAL LOW (ref 4.22–5.81)
RDW: 24.8 % — ABNORMAL HIGH (ref 11.5–15.5)
WBC: 8.4 10*3/uL (ref 4.0–10.5)
nRBC: 0.2 % (ref 0.0–0.2)

## 2020-04-15 LAB — GLUCOSE, CAPILLARY
Glucose-Capillary: 200 mg/dL — ABNORMAL HIGH (ref 70–99)
Glucose-Capillary: 299 mg/dL — ABNORMAL HIGH (ref 70–99)
Glucose-Capillary: 241 mg/dL — ABNORMAL HIGH (ref 70–99)
Glucose-Capillary: 208 mg/dL — ABNORMAL HIGH (ref 70–99)

## 2020-04-15 LAB — MAGNESIUM: Magnesium: 2.4 mg/dL (ref 1.7–2.4)

## 2020-04-15 LAB — BASIC METABOLIC PANEL
Anion gap: 13 (ref 5–15)
BUN: 47 mg/dL — ABNORMAL HIGH (ref 8–23)
CO2: 28 mmol/L (ref 22–32)
Calcium: 7.9 mg/dL — ABNORMAL LOW (ref 8.9–10.3)
Chloride: 86 mmol/L — ABNORMAL LOW (ref 98–111)
Creatinine, Ser: 2.09 mg/dL — ABNORMAL HIGH (ref 0.61–1.24)
GFR, Estimated: 34 mL/min — ABNORMAL LOW (ref >60)
Glucose, Bld: 412 mg/dL — ABNORMAL HIGH (ref 70–99)
Potassium: 4.7 mmol/L (ref 3.5–5.1)
Sodium: 127 mmol/L — ABNORMAL LOW (ref 135–145)

## 2020-04-15 LAB — COOXEMETRY PANEL
Carboxyhemoglobin: 1.7 % — ABNORMAL HIGH (ref 0.5–1.5)
Methemoglobin: 0.9 % (ref 0.0–1.5)
O2 Saturation: 49.8 %
Total hemoglobin: 8.6 g/dL — ABNORMAL LOW (ref 12.0–16.0)

## 2020-04-15 LAB — PROCALCITONIN: Procalcitonin: 1.39 ng/mL

## 2020-04-15 MED ORDER — SODIUM CHLORIDE 0.9 % IV SOLN: 510.0000 mg | INTRAVENOUS | Status: DC

## 2020-04-15 MED ORDER — INSULIN GLARGINE 100 UNIT/ML ~~LOC~~ SOLN
30.0000 [IU] | Freq: Two times a day (BID) | SUBCUTANEOUS | Status: DC
  Administered 2020-04-15 – 2020-04-16 (×2): 30 [IU] via SUBCUTANEOUS
  Filled 2020-04-15 (×3): qty 0.3

## 2020-04-15 MED ORDER — ADULT MULTIVITAMIN W/MINERALS CH
1.0000 | ORAL_TABLET | Freq: Every day | ORAL | Status: DC
  Administered 2020-04-16 – 2020-04-20 (×5): 1 via ORAL
  Filled 2020-04-15 (×5): qty 1

## 2020-04-15 MED ORDER — SODIUM CHLORIDE 0.9 % IV SOLN
2.0000 g | Freq: Two times a day (BID) | INTRAVENOUS | Status: DC
  Administered 2020-04-15 – 2020-04-17 (×6): 2 g via INTRAVENOUS
  Filled 2020-04-15 (×6): qty 2

## 2020-04-15 MED ORDER — PROSOURCE PLUS PO LIQD
30.0000 mL | Freq: Three times a day (TID) | ORAL | Status: DC
  Administered 2020-04-16 – 2020-04-17 (×4): 30 mL via ORAL
  Filled 2020-04-15 (×4): qty 30
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
## 2020-04-15 NOTE — Progress Notes (Signed)
Inpatient Diabetes Program Recommendations  AACE/ADA: New Consensus Statement on Inpatient Glycemic Control (2015)  Target Ranges:  Prepandial:   less than 140 mg/dL      Peak postprandial:   less than 180 mg/dL (1-2 hours)      Critically ill patients:  140 - 180 mg/dL   Lab Results  Component Value Date   GLUCAP 299 (H) 04/15/2020   HGBA1C 8.4 (H) 12/10/2019    Review of Glycemic Control Results for SCHAFER, STUDER (MRN [id number]) as of 04/15/2020 12:30  Ref. Range 04/14/2020 21:13 04/15/2020 06:09 04/15/2020 11:59  Glucose-Capillary Latest Ref Range: 70 - 99 mg/dL 183 (H) 200 (H) 299 (H)   Diabetes history: Type 2 DM Outpatient Diabetes medications: Novolog 70/30 60 units BID Current orders for Inpatient glycemic control: Lantus 25 units BID, Novolog 5 units TID, Novolog 0-20 units TID  Inpatient Diabetes Program Recommendations:    Consider further increasing Lantus to 30 units BID.  Thanks, Bronson Curb, MSN, RNC-OB Diabetes Coordinator [phone] (8a-5p)

## 2020-04-15 NOTE — Progress Notes (Signed)
   04/15/20 1639  Assess: MEWS Score  Temp (!) 100.8 F (38.2 C)  BP 112/83  Pulse Rate (!) 130  Resp 18  SpO2 96 %  O2 Device HFNC  Assess: MEWS Score  MEWS Temp 1  MEWS Systolic 0  MEWS Pulse 3  MEWS RR 0  MEWS LOC 0  MEWS Score 4  MEWS Score Color Red  Assess: if the MEWS score is Yellow or Red  Were vital signs taken at a resting state? Yes  Focused Assessment No change from prior assessment  Early Detection of Sepsis Score *See Row Information* Medium  MEWS guidelines implemented *See Row Information* No, previously yellow, continue vital signs every 4 hours  Treat  MEWS Interventions Escalated (See documentation below)  Pain Scale 0-10  Pain Score 0  Faces Pain Scale 0  Notify: Provider  Provider Name/Title Early Osmond  Date Provider Notified 04/15/20  Time Provider Notified 1735  Notification Type Page  Notification Reason Change in status  Response No new orders  Date of Provider Response 04/15/20  Time of Provider Response 1736  Notify: Rapid Response  Name of Rapid Response RN Notified n/a  Document  Patient Outcome Stabilized after interventions  Progress note created (see row info) Yes  Dr. Valentina Gu: to give Tylenol

## 2020-04-15 NOTE — Progress Notes (Signed)
Occupational Therapy Treatment Patient Details Name: Raymond Pratt MRN: [id number] DOB: Jul 22, 1950 Today's Date: 04/15/2020    History of present illness Raymond Pratt is a 70 y.o. male with medical history significant of combined systolic and diastolic heart failure, atrial fibrillation, anemia, cellulitis, GI bleed, hypertension, OSA on CPAP, diabetes, nonobstructive CAD who presents with worsening shortness of breath. Found to have acute exacerbation of CHF.   OT comments  Pt making steady progress towards OT goals this session. Session focus on BADL reeducation and functional mobility. Pt continues to present with decreased activity tolerance, generalized deconditioning and impaired balance. Pt currently requires min guard assist for functional mobility with RW, MAX A for LB ADLs and minguard assist for UB grooming tasks at sink with pt heavily leaning on sink. Pt on 3L [place] upon arrival sats >92% during mobility tasks. Pt HR maintaining 120-130s during mobility. Pt would continue to benefit from skilled occupational therapy while admitted and after d/c to address the below listed limitations in order to improve overall functional mobility and facilitate independence with BADL participation. DC plan remains appropriate, will follow acutely per POC.     Follow Up Recommendations  Home health OT;Supervision - Intermittent    Equipment Recommendations  None recommended by OT    Recommendations for Other Services      Precautions / Restrictions Precautions Precautions: Fall;Other (comment) Precaution Comments: watch HR, wound on left heel and front of ankle Restrictions Weight Bearing Restrictions: No       Mobility Bed Mobility Overal bed mobility: Modified Independent Bed Mobility: Supine to Sit     Supine to sit: Modified independent (Device/Increase time);HOB elevated     General bed mobility comments: no physical assist needed, increased time with use of  rail, pt reports wanting to get hospital bed for home  Transfers Overall transfer level: Needs assistance Equipment used: Rolling walker (2 wheeled) Transfers: Sit to/from Stand Sit to Stand: Min guard         General transfer comment: minguard for safety, mostly to manage lines. cues for hand placement each trial    Balance Overall balance assessment: Needs assistance Sitting-balance support: Feet supported;No upper extremity supported Sitting balance-Leahy Scale: Good Sitting balance - Comments: sitting EOB no UE support and no LOB   Standing balance support: Bilateral upper extremity supported;During functional activity Standing balance-Leahy Scale: Poor Standing balance comment: leaning heavily on BUEs when standing at sink                           ADL either performed or assessed with clinical judgement   ADL Overall ADL's : Needs assistance/impaired     Grooming: Oral care;Standing;Min guard Grooming Details (indicate cue type and reason): minguard for balance with pt heavily leaning on sink, pt reports " this is comfortable"             Lower Body Dressing: Maximal assistance;Sitting/lateral leans Lower Body Dressing Details (indicate cue type and reason): to adjsut socks from EOB Toilet Transfer: Min Marine scientist Details (indicate cue type and reason): simulated via functional mobility, most assist needed for equipment mgmt         Functional mobility during ADLs: Min guard;Rolling walker General ADL Comments: pt continues to present with decreased activity tolerance, generalized deconditioning and impaired balance     Vision       Perception     Praxis      Cognition Arousal/Alertness: Awake/alert Behavior During  Therapy: WFL for tasks assessed/performed Overall Cognitive Status: Within Functional Limits for tasks assessed                                          Exercises     Shoulder  Instructions       General Comments pt on 3L upon arrival sats >92% during mobility tasks. pt HR maintaining 120-130s during mobility    Pertinent Vitals/ Pain       Pain Assessment: No/denies pain  Home Living                                          Prior Functioning/Environment              Frequency  Min 2X/week        Progress Toward Goals  OT Goals(current goals can now be found in the care plan section)  Progress towards OT goals: Progressing toward goals  Acute Rehab OT Goals Patient Stated Goal: I want to go home OT Goal Formulation: With patient Time For Goal Achievement: 05/05/2020 Potential to Achieve Goals: Good  Plan Discharge plan remains appropriate;Frequency remains appropriate    Co-evaluation                 AM-PAC OT "6 Clicks" Daily Activity     Outcome Measure   Help from another person eating meals?: None Help from another person taking care of personal grooming?: A Little Help from another person toileting, which includes using toliet, bedpan, or urinal?: A Little Help from another person bathing (including washing, rinsing, drying)?: A Lot Help from another person to put on and taking off regular upper body clothing?: A Little Help from another person to put on and taking off regular lower body clothing?: A Lot 6 Click Score: 17    End of Session Equipment Utilized During Treatment: Rolling walker;Oxygen;Other (comment) (3L)  OT Visit Diagnosis: Unsteadiness on feet (R26.81);Muscle weakness (generalized) (M62.81);Pain   Activity Tolerance Patient tolerated treatment well   Patient Left in bed;with call bell/phone within reach;Other (comment) (sitting EOB)   Nurse Communication Mobility status        Time: [id number] OT Time Calculation (min): 42 min  Charges: OT General Charges $OT Visit: 1 Visit OT Treatments $Self Care/Home Management : 38-52 mins Harley Alto., COTA/L Acute Rehabilitation  Services [phone]    Precious Haws 04/15/2020, 9:19 AM

## 2020-04-15 NOTE — Progress Notes (Addendum)
Advanced Heart Failure Rounding Note   Subjective:   2/7 febrile. Blood Cx obtained. Started antibiotics.   Bld Cx 1/2 Serratia Marcescens + enterobacterales  CO-OX 50% on milrinone 0.25 mcg.   Remains in A fib RVR on amio drip.   Feeling better today.   Objective:   Weight Range:  Vital Signs:   Temp:  [98.2 F (36.8 C)-102 F (38.9 C)] 99.2 F (37.3 C) (02/08 0353) Pulse Rate:  [94-140] 140 (02/08 0353) Resp:  [18-30] 19 (02/08 0353) BP: (94-126)/(51-79) 119/65 (02/08 0353) SpO2:  [90 %-96 %] 92 % (02/08 0353) Weight:  [131.2 kg] 131.2 kg (02/08 0129) Last BM Date: 04/14/20  Weight change: Filed Weights   04/13/20 0600 04/14/20 0400 04/15/20 0129  Weight: 130.4 kg 131 kg 131.2 kg    Intake/Output:   Intake/Output Summary (Last 24 hours) at 04/15/2020 0847 Last data filed at 04/15/2020 0640 Gross per 24 hour  Intake 1903.65 ml  Output 100 ml  Net 1803.65 ml  CVP  7-8  PHYSICAL EXAM: General:  No resp difficulty HEENT: normal Neck: supple. JVP 7-8 . Carotids 2+ bilat; no bruits. No lymphadenopathy or thryomegaly appreciated. Cor: PMI nondisplaced. Irregular rate & rhythm. No rubs, gallops or murmurs. Lungs: clear Abdomen: soft, nontender, nondistended. No hepatosplenomegaly. No bruits or masses. Good bowel sounds. Extremities: no cyanosis, clubbing, rash, edema Neuro: alert & orientedx3, cranial nerves grossly intact. moves all 4 extremities w/o difficulty. Affect pleasant   Telemetry:  A fib 120-130s  Labs: Basic Metabolic Panel: Recent Labs  Lab 04/09/20 0509 04/10/20 0349 04/11/20 0500 04/12/20 0550 04/13/20 0623 04/14/20 0443 04/15/20 0429  NA 134* 134* 137 136 132* 130* 129*  K 3.7 4.1 3.8 4.6 4.2 4.4 4.8  CL 89* 90* 94* 93* 88* 86* 89*  CO2 34* 32 32 32 '31 31 29  '$ GLUCOSE 185* 354* 169* 148* 294* 333* 215*  BUN 36* 34* 32* 31* 37* 41* 46*  CREATININE 1.65* 1.98* 1.67* 1.87* 1.86* 1.68* 2.07*  CALCIUM 8.8* 8.7* 8.8* 9.0 8.4* 8.5* 8.4*   MG 2.4 2.5* 2.5*  --  2.3  --  2.4  PHOS 3.7  --   --   --   --   --   --     Liver Function Tests: Recent Labs  Lab 04/09/20 0509 04/14/20 0443 04/15/20 0429  AST 14* 22 28  ALT '25 18 22  '$ ALKPHOS 72 61 71  BILITOT 1.2 0.8 1.0  PROT 6.8 6.9 6.4*  ALBUMIN 3.1* 2.8* 2.7*   No results for input(s): LIPASE, AMYLASE in the last 168 hours. No results for input(s): AMMONIA in the last 168 hours.  CBC: Recent Labs  Lab 04/09/20 0509 04/13/20 0623 04/14/20 1433 04/15/20 0429  WBC 6.6 7.9 8.3 8.4  NEUTROABS 4.2  --  6.5  --   HGB 8.9* 9.5* 10.0* 8.5*  HCT 30.0* 33.4* 34.7* 28.1*  MCV 80.0 82.1 82.4 80.1  PLT 437* 389 399 341    Cardiac Enzymes: No results for input(s): CKTOTAL, CKMB, CKMBINDEX, TROPONINI in the last 168 hours.  BNP: BNP (last 3 results) Recent Labs    12/10/19 0057 02/13/20 0807 04/01/2020 2019  BNP 154.7* 111.6* 195.0*    ProBNP (last 3 results) No results for input(s): PROBNP in the last 8760 hours.    Other results:  Imaging: DG CHEST PORT 1 VIEW  Result Date: 04/14/2020 CLINICAL DATA:  Pneumonia EXAM: PORTABLE CHEST 1 VIEW COMPARISON:  04/09/2020 FINDINGS: Pulmonary vascular  congestion. Interstitial prominence. Increased patchy left lung opacities. No significant pleural effusion. No pneumothorax. Stable cardiomegaly. PICC line remains present. IMPRESSION: Increased patchy left lung opacities superimposed on interstitial prominence and pulmonary vascular congestion. Asymmetric pulmonary edema and pneumonia are considerations. Electronically Signed   By: Macy Mis M.D.   On: 04/14/2020 14:39     Medications:     Scheduled Medications: . apixaban  5 mg Oral BID  . atorvastatin  40 mg Oral Daily  . Chlorhexidine Gluconate Cloth  6 each Topical Daily  . insulin aspart  0-20 Units Subcutaneous TID WC  . insulin aspart  5 Units Subcutaneous TID WC  . insulin glargine  25 Units Subcutaneous BID  . metoCLOPramide  10 mg Oral TID AC & HS   . potassium chloride SA  40 mEq Oral BID  . sodium chloride flush  3 mL Intravenous Q12H  . spironolactone  12.5 mg Oral Daily    Infusions: . amiodarone 60 mg/hr (04/15/20 0527)  . ceFEPime (MAXIPIME) IV 2 g (04/15/20 0526)  . famotidine (PEPCID) IV Stopped (04/14/20 1720)  . ferumoxytol 510 mg (04/09/20 1654)  . milrinone 0.25 mcg/kg/min (04/14/20 2055)    PRN Medications: acetaminophen **OR** acetaminophen, alum & mag hydroxide-simeth, [COMPLETED] diphenhydrAMINE **FOLLOWED BY** diphenhydrAMINE, hydrocortisone cream, hydrOXYzine, Muscle Rub, ondansetron (ZOFRAN) IV, polyethylene glycol, sodium chloride flush   Assessment/Plan:   1.  Acute on chronic systolic heart failure - Echo (11/2018) EF 25-30%, trace MR, trivial TR, moderately reduced RSVF.   - Cath (12/2018) RA 10, RV 55/8, PA 54/15 (27), PCW = 16, Fick CO/CI = 7.0/2.8 - Echo (04/04/2020) EF 45-50%, mild LVH, hypokinesis LV, dilated LA, dilated RV/RA, mod MR, mod-severe TR, RVSP > 60 mmHg.  - Milrinone started on 1/29 for low CO-OX.  - Over the weekend milrinone was increased 0.25 mcg due to low  CO-OX .  - Todays CO-OX 50% on milrinone 0.25 mcg.  - CVP 7-8 today. Hold diuretics today.  - Likely triggered by AF, monitor closely while on inotropes  - Holding Entresto and dig for now.  - Continue Spiro 12.5 mg  - Renal function suspect this may be from hypotension/sepsis . Hold diuretics.   2. Persistent AF - has been on amio at home - suspect has been in AF for several weeks. ECG in 12/21 was NSR - Last week he went back in NSR but over the weekend back in A fib RVR.  - Will need cardioversion soon.  - Continue amio to 60 mg per hour.   - TFTs not too bad - continue Eliquis  3. Gram-negative sepsis -2/7 Fever. Lactic Acid 2.2>1.2  - WBC not elevated.  -2/7Bld Cx 1/2-Serratia Marcescens + enterobacterales - Antibiotics switched to cefepime  - UA negative    4. Acute on chronic hypoxic respiratory failure/OSA -  Continue with CPAP at night for OSA - He has been refusing CPAP.   5. CKD 3b - Baseline Cr 1.5-1.7 - SCr 1.7 >2.0  - Continue milrinone support  6. Venous stasis wounds L tib/fib, L heel & Sacral wound - Wound care consulted. Primary team managing   7. T2DM - A1c 12.4% recent hospitalization - SSI Per primary team - Would be concerned about SGT2i with mild erythema under pannus  8. Morbidly obese Body mass index is 45.3 kg/m.  9. Lung nodule - Will need outpatient imaging in 3 months, 9 mm CT (03/2020)  10. Hypokalemia/ Hypomagnesemia -K /Mag stable.  - Keep K> 4.0 Mg >  2.0   11. Hyponatremia - Sodium 129. Restrict free water.    12. Anemia/Hx of GIB - Hgb stable  9.8 on CO-OX   - EGD 12/2019 nonbleeding duodenal ulcer with gastritis - Received Feraheme 04/09/20  - Hgb 8.5 today. No obvious source.      Length of Stay: [place] NP-C  04/15/2020, 8:47 AM  Advanced Heart Failure Team Pager [phone] (M-F; 7a - 4p)  Please contact [facility] for night-coverage after hours (4p -7a ) and weekends on [email or web address]  Patient seen and examined with the above-signed Advanced Practice Provider and/or Housestaff. I personally reviewed laboratory data, imaging studies and relevant notes. I independently examined the patient and formulated the important aspects of the plan. I have edited the note to reflect any of my changes or salient points. I have personally discussed the plan with the patient and/or family.  Developed gram negative sepsis yesterday. Bcx as above. Started on vanc/zosyn and changed to cefepime. Tm 102. Chills have resolved. Co-ox 50% on milrinone. Creatinine elevated but plateauing. Remains in AF with RVR.   General:  Ill appearing. No resp difficulty HEENT: normal Neck: supple. JVP elavated. Carotids 2+ bilat; no bruits. No lymphadenopathy or thryomegaly appreciated. Cor: PMI nondisplaced. Tachy iregular rate & rhythm. No rubs, gallops or  murmurs. Lungs: coarse Abdomen: obese soft, nontender, nondistended. No hepatosplenomegaly. No bruits or masses. Good bowel sounds. Extremities: no cyanosis, clubbing, rash, trace edema Neuro: alert & orientedx3, cranial nerves grossly intact. moves all 4 extremities w/o difficulty. Affect pleasant  Sepsis stabilizing on broad spectrum abx. Suspect probable HCAP with bacteremia. Remains on milrinone and IV amio.   Would continue milrinone. Hold lasix. Plan TEE/DC-CV when respiratory and hemodynamic status more stable.   [name], MD  10:10 AM

## 2020-04-15 NOTE — Progress Notes (Addendum)
PROGRESS NOTE    Raymond Pratt  [id number] DOB: 1950-08-24 DOA: 03/13/2020 PCP: Shon Baton, MD   Brief Narrative:  Patient is 70 year old male with past medical history of combined systolic and diastolic CHF, A. fib, anemia, cellulitis, GI bleed, hypertension, OSA on CPAP, diabetes, coronary artery disease present to emergency department with shortness of breath.  ED course: Patient was noted to be hypoxic requiring 4 L of oxygen, tachycardic, tachypneic, potassium of 5.3, creatinine: 1.38, BNP: 195, troponin: 20, chest x-ray showed cardiomegaly, pulmonary vascular congestion and perihilar edema.  Patient started on Lasix and cardiology was consulted.  Assessment & Plan:   Acute hypoxemic respiratory failure in the setting of acute on chronic systolic CHF: -Patient presented with signs of fluid overload.  BNP: 195.  On admission shows cardiac enlargement with pulmonary vascular congestion and perihilar edema.  Requiring 3 L of oxygen via nasal cannula -Echo showed ejection fraction of 45 to 50%, mild LVH, hypokinesis, dilated RV/RA, moderate MR, moderate to severe TR -Continue milrinone and Aldactone, Lasix-held by cardiology due to worsening renal function -Continue to hold Entresto and digoxin for now -Strict INO's and daily weight.  Keep potassium more than 4, magnesium: More than 2 -He is -17 L  and >40 pounds down since admission. -Appreciate cardiology's recommendation  Persistent A. Fib with RVR: -Patient continues to be in RVR-continue amiodarone and Eliquis.  Monitor closely on telemetry. -Plan is for TEE/DCCV when respiratory and hemodynamic status stable-appreciate cardiology help.  Sepsis in the setting of  Serratia marcescens  Bacteremia: -On 2/7: Patient developed high-grade fever of 102.  Lactic acid: 2.2 trended down to 1.2.  -Reviewed chest x-ray. -Blood culture ordered and is pending.  Procalcitonin: 1.38--> 1.39.  UA negative for infection. -Antibiotics  changed from Zosyn to cefepime based on culture results.  -ID consulted-likely line related.  No need to remove line at this time.  Continue cefepime for 7 days-appreciate IDs recommendation.  Persistent nausea: -Improving -Likely diabetic gastropathy -Continue scheduled Reglan and Zofran as needed-Continue IV Pepcid and Maalox  Hyponatremia: -Sodium 129 this morning.  Urine sodium less than 10, serum osmolality: 294.  Likely secondary to diuretics. -Monitor closely.    AKI on CKD stage III A:  -Renal function: Declined this morning with creatinine of 2.07, GFR of 34.  Hold diuretics as per cardiology. -Monitor renal function closely in the setting of aggressive diuresis.  Anemia of chronic disease/history of GI bleed: -H&H is currently stable.  Continue to monitor.  - transfuse if hemoglobin less than 7  Uncontrolled type 2 diabetes mellitus with hyperglycemia: A1c 12.4%.   sliding scale insulin and NovoLog 5 units 3 times daily with meals.  Increase Lantus 25 units to 30 units twice daily.  Monitor blood sugar closely  Chronic bilateral venous stasis: -Continue with wound care-no signs of infection.  Hypokalemia/hypomagnesemia: Replenished  Morbid obesity with BMI of 46: -Diet modification/exercise and weight loss recommended.  OSA: CPAP at nighttime  Lung nodule: Noted on CT scan.  Outpatient follow-up in 3 months.  Pressure injury: POA -Stage III left heel and right buttocks -Appreciate wound care recommendations  DVT prophylaxis: Eliquis Code Status: Full code Family Communication:  None present at bedside.  Plan of care discussed with patient in length and he verbalized understanding and agreed with it. Disposition Plan: To be determined  Consultants:   Cardiology  Procedures:   Echo  Antimicrobials:  Zosyn Vancomycin Cefepime  Status is: Inpatient   Dispo: The patient is from: Home  Anticipated d/c is to: Home              Anticipated  d/c date is: 3 days              Patient currently is not medically stable to d/c.   Difficult to place patient no     Subjective: Patient seen and examined this morning.  Sitting comfortably on the bed.  PT at the bedside.  Tells me that he feels better this morning and his nausea has improved.  Denies chest pain, shortness of breath, leg swelling, orthopnea or PND.  Objective: Vitals:   04/14/20 1700 04/14/20 1931 04/15/20 0129 04/15/20 0353  BP: 117/79 105/65  119/65  Pulse:  (!) 131  (!) 140  Resp:  20  19  Temp:  99.5 F (37.5 C)  99.2 F (37.3 C)  TempSrc:  Oral  Oral  SpO2:  90%  92%  Weight:   131.2 kg   Height:        Intake/Output Summary (Last 24 hours) at 04/15/2020 0915 Last data filed at 04/15/2020 0640 Gross per 24 hour  Intake 1903.65 ml  Output 100 ml  Net 1803.65 ml   Filed Weights   04/13/20 0600 04/14/20 0400 04/15/20 0129  Weight: 130.4 kg 131 kg 131.2 kg    Examination:  General exam: Appears calm and comfortable, morbidly obese, on nasal cannula, appears chronically ill Respiratory system: No wheezing, rhonchi or crackles.. Cardiovascular system: Tachycardic, irregular rhythm. No JVD, murmurs, rubs, gallops or clicks.  Trace pedal edema noted bilaterally. Gastrointestinal system: Abdomen is obese, soft and nontender. No organomegaly or masses felt. Normal bowel sounds heard. Central nervous system: Alert and oriented. No focal neurological deficits. Extremities: Symmetric 5 x 5 power. Skin: Bilateral chronic venous stasis ulcer with intact dressing noted in both lower extremities. Psychiatry: Judgement and insight appear normal. Mood & affect appropriate.    Data Reviewed: I have personally reviewed following labs and imaging studies  CBC: Recent Labs  Lab 04/09/20 0509 04/13/20 0623 04/14/20 1433 04/15/20 0429  WBC 6.6 7.9 8.3 8.4  NEUTROABS 4.2  --  6.5  --   HGB 8.9* 9.5* 10.0* 8.5*  HCT 30.0* 33.4* 34.7* 28.1*  MCV 80.0 82.1 82.4  80.1  PLT 437* 389 399 [id number]   Basic Metabolic Panel: Recent Labs  Lab 04/09/20 0509 04/10/20 0349 04/11/20 0500 04/12/20 0550 04/13/20 0623 04/14/20 0443 04/15/20 0429  NA 134* 134* 137 136 132* 130* 129*  K 3.7 4.1 3.8 4.6 4.2 4.4 4.8  CL 89* 90* 94* 93* 88* 86* 89*  CO2 34* 32 32 32 '31 31 29  '$ GLUCOSE 185* 354* 169* 148* 294* 333* 215*  BUN 36* 34* 32* 31* 37* 41* 46*  CREATININE 1.65* 1.98* 1.67* 1.87* 1.86* 1.68* 2.07*  CALCIUM 8.8* 8.7* 8.8* 9.0 8.4* 8.5* 8.4*  MG 2.4 2.5* 2.5*  --  2.3  --  2.4  PHOS 3.7  --   --   --   --   --   --    GFR: Estimated Creatinine Clearance: 44.5 mL/min (A) (by C-G formula based on SCr of 2.07 mg/dL (H)). Liver Function Tests: Recent Labs  Lab 04/09/20 0509 04/14/20 0443 04/15/20 0429  AST 14* 22 28  ALT '25 18 22  '$ ALKPHOS 72 61 71  BILITOT 1.2 0.8 1.0  PROT 6.8 6.9 6.4*  ALBUMIN 3.1* 2.8* 2.7*   No results for input(s): LIPASE, AMYLASE in the last 168 hours. No results  for input(s): AMMONIA in the last 168 hours. Coagulation Profile: No results for input(s): INR, PROTIME in the last 168 hours. Cardiac Enzymes: No results for input(s): CKTOTAL, CKMB, CKMBINDEX, TROPONINI in the last 168 hours. BNP (last 3 results) No results for input(s): PROBNP in the last 8760 hours. HbA1C: No results for input(s): HGBA1C in the last 72 hours. CBG: Recent Labs  Lab 04/14/20 0530 04/14/20 1145 04/14/20 1707 04/14/20 2113 04/15/20 0609  GLUCAP 218* 228* 261* 183* 200*   Lipid Profile: No results for input(s): CHOL, HDL, LDLCALC, TRIG, CHOLHDL, LDLDIRECT in the last 72 hours. Thyroid Function Tests: No results for input(s): TSH, T4TOTAL, FREET4, T3FREE, THYROIDAB in the last 72 hours. Anemia Panel: No results for input(s): VITAMINB12, FOLATE, FERRITIN, TIBC, IRON, RETICCTPCT in the last 72 hours. Sepsis Labs: Recent Labs  Lab 04/14/20 1433 04/14/20 2002 04/15/20 0429  PROCALCITON 1.38  --  1.39  LATICACIDVEN 2.2* 1.2  --      Recent Results (from the past 240 hour(s))  Culture, blood (routine x 2)     Status: None (Preliminary result)   Collection Time: 04/14/20  9:37 AM   Specimen: BLOOD  Result Value Ref Range Status   Specimen Description BLOOD RIGHT ANTECUBITAL  Final   Special Requests   Final    BOTTLES DRAWN AEROBIC AND ANAEROBIC Blood Culture results may not be optimal due to an inadequate volume of blood received in culture bottles   Culture  Setup Time   Final    CORRECTED RESULTS GRAM NEGATIVE RODS PREVIOUSLY REPORTED AS: GRAM NEGATIVE COCCI CORRECTED RESULTS CALLED TOPamala Hurry [id number] FCP Performed at [facility], [place] [address] [place], [place] [address]    Culture GRAM NEGATIVE RODS  Final   Report Status PENDING  Incomplete  Blood Culture ID Panel (Reflexed)     Status: Abnormal   Collection Time: 04/14/20  9:37 AM  Result Value Ref Range Status   Enterococcus faecalis NOT DETECTED NOT DETECTED Final   Enterococcus Faecium NOT DETECTED NOT DETECTED Final   Listeria monocytogenes NOT DETECTED NOT DETECTED Final   Staphylococcus species NOT DETECTED NOT DETECTED Final   Staphylococcus aureus (BCID) NOT DETECTED NOT DETECTED Final   Staphylococcus epidermidis NOT DETECTED NOT DETECTED Final   Staphylococcus lugdunensis NOT DETECTED NOT DETECTED Final   Streptococcus species NOT DETECTED NOT DETECTED Final   Streptococcus agalactiae NOT DETECTED NOT DETECTED Final   Streptococcus pneumoniae NOT DETECTED NOT DETECTED Final   Streptococcus pyogenes NOT DETECTED NOT DETECTED Final   A.calcoaceticus-baumannii NOT DETECTED NOT DETECTED Final   Bacteroides fragilis NOT DETECTED NOT DETECTED Final   Enterobacterales DETECTED (A) NOT DETECTED Final    Comment: Enterobacterales represent a large order of gram negative bacteria, not a single organism. CRITICAL RESULT CALLED TO, READ BACK BY AND VERIFIED WITH: [place] ON  04/15/2020     Enterobacter cloacae complex NOT DETECTED NOT DETECTED Final   Escherichia coli NOT DETECTED NOT DETECTED Final   Klebsiella aerogenes NOT DETECTED NOT DETECTED Final   Klebsiella oxytoca NOT DETECTED NOT DETECTED Final   Klebsiella pneumoniae NOT DETECTED NOT DETECTED Final   Proteus species NOT DETECTED NOT DETECTED Final   Salmonella species NOT DETECTED NOT DETECTED Final   Serratia marcescens DETECTED (A) NOT DETECTED Final    Comment: CRITICAL RESULT CALLED TO, READ BACK BY AND VERIFIED WITH: PHARMD AMEND C. BY MESSAN H. AT 0320 ON  04/15/2020  Haemophilus influenzae NOT DETECTED NOT DETECTED Final   Neisseria meningitidis NOT DETECTED NOT DETECTED Final   Pseudomonas aeruginosa NOT DETECTED NOT DETECTED Final   Stenotrophomonas maltophilia NOT DETECTED NOT DETECTED Final   Candida albicans NOT DETECTED NOT DETECTED Final   Candida auris NOT DETECTED NOT DETECTED Final   Candida glabrata NOT DETECTED NOT DETECTED Final   Candida krusei NOT DETECTED NOT DETECTED Final   Candida parapsilosis NOT DETECTED NOT DETECTED Final   Candida tropicalis NOT DETECTED NOT DETECTED Final   Cryptococcus neoformans/gattii NOT DETECTED NOT DETECTED Final   CTX-M ESBL NOT DETECTED NOT DETECTED Final   Carbapenem resistance IMP NOT DETECTED NOT DETECTED Final   Carbapenem resistance KPC NOT DETECTED NOT DETECTED Final   Carbapenem resistance NDM NOT DETECTED NOT DETECTED Final   Carbapenem resist OXA 48 LIKE NOT DETECTED NOT DETECTED Final   Carbapenem resistance VIM NOT DETECTED NOT DETECTED Final    Comment: Performed at [facility], [address], [place], [place] [address]  Culture, blood (routine x 2)     Status: None (Preliminary result)   Collection Time: 04/14/20  9:42 AM   Specimen: BLOOD RIGHT HAND  Result Value Ref Range Status   Specimen Description BLOOD RIGHT HAND  Final   Special Requests   Final    BOTTLES DRAWN AEROBIC AND ANAEROBIC Blood Culture results may not be  optimal due to an inadequate volume of blood received in culture bottles   Culture  Setup Time   Final    GRAM NEGATIVE COCCI IN BOTH AEROBIC AND ANAEROBIC BOTTLES CRITICAL VALUE NOTED.  VALUE IS CONSISTENT WITH PREVIOUSLY REPORTED AND CALLED VALUE. Performed at [facility], [place] [address] [place], [place] [address]    Culture PENDING  Incomplete   Report Status PENDING  Incomplete      Radiology Studies: DG CHEST PORT 1 VIEW  Result Date: 04/14/2020 CLINICAL DATA:  Pneumonia EXAM: PORTABLE CHEST 1 VIEW COMPARISON:  04/09/2020 FINDINGS: Pulmonary vascular congestion. Interstitial prominence. Increased patchy left lung opacities. No significant pleural effusion. No pneumothorax. Stable cardiomegaly. PICC line remains present. IMPRESSION: Increased patchy left lung opacities superimposed on interstitial prominence and pulmonary vascular congestion. Asymmetric pulmonary edema and pneumonia are considerations. Electronically Signed   By: Macy Mis M.D.   On: 04/14/2020 14:39    Scheduled Meds: . apixaban  5 mg Oral BID  . atorvastatin  40 mg Oral Daily  . Chlorhexidine Gluconate Cloth  6 each Topical Daily  . insulin aspart  0-20 Units Subcutaneous TID WC  . insulin aspart  5 Units Subcutaneous TID WC  . insulin glargine  25 Units Subcutaneous BID  . metoCLOPramide  10 mg Oral TID AC & HS  . potassium chloride SA  40 mEq Oral BID  . sodium chloride flush  3 mL Intravenous Q12H  . spironolactone  12.5 mg Oral Daily   Continuous Infusions: . amiodarone 60 mg/hr (04/15/20 0527)  . ceFEPime (MAXIPIME) IV 2 g (04/15/20 0526)  . famotidine (PEPCID) IV Stopped (04/14/20 1720)  . ferumoxytol 510 mg (04/09/20 1654)  . milrinone 0.25 mcg/kg/min (04/15/20 0901)     LOS: 12 days   Time spent: 35 minutes   Abhishek Levesque Loann Quill, MD [facility]  If 7PM-7AM, please contact night-coverage [email or web address] 04/15/2020, 9:15 AM

## 2020-04-15 NOTE — Progress Notes (Signed)
Pt states he is not going to wear cpap d/t not being able to breathe while wearing it.  RT removed device at this time but left his mask and circuit and explained he could request again at anytime.  RT will continue to monitor.

## 2020-04-15 NOTE — Progress Notes (Signed)
Pharmacy Antibiotic Note  Raymond Pratt is a 70 y.o. male admitted on 03/09/2020 with shortness of breath.  Pharmacy was consulted for vancomycin and zosyn dosing for suspected bacteremia. BCID shows serratia marcescens bacteremia. Change antibiotics to Cefepime per pharmacy (okay with Dr. Marlowe Sax).  Plan: Cefepime 2gm IV q12h Will f/u renal function, micro data, and pt's clinical condition  Height: '5\' 7"'$  (170.2 cm) Weight: 131.2 kg (289 lb 3.2 oz) (scale a) IBW/kg (Calculated) : 66.1  Temp (24hrs), Avg:99.5 F (37.5 C), Min:98.1 F (36.7 C), Max:102 F (38.9 C)  Recent Labs  Lab 04/08/20 0519 04/09/20 0509 04/10/20 0349 04/11/20 0500 04/12/20 0550 04/13/20 0623 04/14/20 0443 04/14/20 1433 04/14/20 2002  WBC 6.8 6.6  --   --   --  7.9  --  8.3  --   CREATININE 1.60* 1.65* 1.98* 1.67* 1.87* 1.86* 1.68*  --   --   LATICACIDVEN  --   --   --   --   --   --   --  2.2* 1.2    Estimated Creatinine Clearance: 54.8 mL/min (A) (by C-G formula based on SCr of 1.68 mg/dL (H)).    Allergies  Allergen Reactions  . Pioglitazone Other (See Comments)    Made the patient retain fluid   Antibiotics this admission: Zosyn 2/7>2/8 Vanc 2/7>2/8 Cefepime 2/8>   Microbiology: 2/7 BCs: serratia marcescens UCx:   Thank you for allowing pharmacy to be a part of this patient's care.  Sherlon Handing, PharmD, BCPS Please see amion for complete clinical pharmacist phone list 04/15/2020 3:39 AM   PHARMACY - PHYSICIAN COMMUNICATION CRITICAL VALUE ALERT - BLOOD CULTURE IDENTIFICATION (BCID)  Assessment: Serratia marcescens bacteremia (likely source PNA)  Name of physician (or Provider) Contacted: [place]  Current antibiotics: Vancomycin and Zosyn  Changes to prescribed antibiotics recommended:  Change abx to Cefepime  Results for orders placed or performed during the hospital encounter of 03/11/2020  Blood Culture ID Panel (Reflexed) (Collected: 04/14/2020  9:37 AM)  Result Value  Ref Range   Enterococcus faecalis NOT DETECTED NOT DETECTED   Enterococcus Faecium NOT DETECTED NOT DETECTED   Listeria monocytogenes NOT DETECTED NOT DETECTED   Staphylococcus species NOT DETECTED NOT DETECTED   Staphylococcus aureus (BCID) NOT DETECTED NOT DETECTED   Staphylococcus epidermidis NOT DETECTED NOT DETECTED   Staphylococcus lugdunensis NOT DETECTED NOT DETECTED   Streptococcus species NOT DETECTED NOT DETECTED   Streptococcus agalactiae NOT DETECTED NOT DETECTED   Streptococcus pneumoniae NOT DETECTED NOT DETECTED   Streptococcus pyogenes NOT DETECTED NOT DETECTED   A.calcoaceticus-baumannii NOT DETECTED NOT DETECTED   Bacteroides fragilis NOT DETECTED NOT DETECTED   Enterobacterales DETECTED (A) NOT DETECTED   Enterobacter cloacae complex NOT DETECTED NOT DETECTED   Escherichia coli NOT DETECTED NOT DETECTED   Klebsiella aerogenes NOT DETECTED NOT DETECTED   Klebsiella oxytoca NOT DETECTED NOT DETECTED   Klebsiella pneumoniae NOT DETECTED NOT DETECTED   Proteus species NOT DETECTED NOT DETECTED   Salmonella species NOT DETECTED NOT DETECTED   Serratia marcescens DETECTED (A) NOT DETECTED   Haemophilus influenzae NOT DETECTED NOT DETECTED   Neisseria meningitidis NOT DETECTED NOT DETECTED   Pseudomonas aeruginosa NOT DETECTED NOT DETECTED   Stenotrophomonas maltophilia NOT DETECTED NOT DETECTED   Candida albicans NOT DETECTED NOT DETECTED   Candida auris NOT DETECTED NOT DETECTED   Candida glabrata NOT DETECTED NOT DETECTED   Candida krusei NOT DETECTED NOT DETECTED   Candida parapsilosis NOT DETECTED NOT DETECTED   Candida tropicalis  NOT DETECTED NOT DETECTED   Cryptococcus neoformans/gattii NOT DETECTED NOT DETECTED   CTX-M ESBL NOT DETECTED NOT DETECTED   Carbapenem resistance IMP NOT DETECTED NOT DETECTED   Carbapenem resistance KPC NOT DETECTED NOT DETECTED   Carbapenem resistance NDM NOT DETECTED NOT DETECTED   Carbapenem resist OXA 48 LIKE NOT DETECTED NOT  DETECTED   Carbapenem resistance VIM NOT DETECTED NOT DETECTED    Sherlon Handing, PharmD, BCPS Please see amion for complete clinical pharmacist phone list 04/15/2020  3:45 AM

## 2020-04-15 NOTE — Care Management Important Message (Signed)
Important Message  Patient Details  Name: Raymond Pratt MRN: [id number] Date of Birth: 11-12-1950   Medicare Important Message Given:  Yes     [name] 04/15/2020, 10:46 AM

## 2020-04-15 NOTE — Progress Notes (Signed)
Initial Nutrition Assessment  DOCUMENTATION CODES:   Morbid obesity  INTERVENTION:   -MVI with minerals daily -30 ml Prosource Plus TID, each supplement provides 100 kcals and 15 grams protein -Magic cup TID with meals, each supplement provides 290 kcal and 9 grams of protein  NUTRITION DIAGNOSIS:   Increased nutrient needs related to wound healing as evidenced by estimated needs.  GOAL:   Patient will meet greater than or equal to 90% of their needs  MONITOR:   PO intake,Supplement acceptance,Labs,Weight trends,Skin,I & O's  REASON FOR ASSESSMENT:   LOS    ASSESSMENT:   Raymond Pratt is a 70 y.o. male with medical history significant of combined systolic and diastolic heart failure, atrial fibrillation, anemia, cellulitis, GI bleed, hypertension, OSA on CPAP, diabetes, nonobstructive CAD who presents with worsening shortness of breath.  Pt admitted with hypertension and CHF exacerbation.  Reviewed I/O's: +1.8 L x 24 hours and -15.9 L since admission  UOP: 100 ml x 24 hours  Pt unavailable at time of visit.   Pt with variable oral intake. Noted meal completions 25-100%.  Reviewed wt hx; wt has been stable over the past 6 months.   Pt with increased nutritional needs due to wound healing and would greatly benefit from addition of oral nutrition supplements.   Plan to discharge to home with home health once medically stable.   Medications reviewed and include reglan and amiodarone.  Lab Results  Component Value Date   HGBA1C 8.4 (H) 12/10/2019   PTA DM medications are 60 units insulin isphane and regular BID.   Labs reviewed: Na: 127, CBGS: 200-299 (inpatient orders for glycemic control are 0-20 units insulin aspart TID with meals, 5 units insulin aspart TID, and 30 units insulin glargine BID).   Diet Order:   Diet Order            Diet heart healthy/carb modified Room service appropriate? Yes; Fluid consistency: Thin; Fluid restriction: 1500 mL  Fluid  Diet effective now                 EDUCATION NEEDS:   No education needs have been identified at this time  Skin:  Skin Assessment: Skin Integrity Issues: Skin Integrity Issues:: Stage II,Other (Comment),Stage III Stage II: rt buttocks Stage III: lt heel Other: non pressure wound lt leg  Last BM:  04/14/20  Height:   Ht Readings from Last 1 Encounters:  04/04/20 '5\' 7"'$  (1.702 m)    Weight:   Wt Readings from Last 1 Encounters:  04/15/20 131.2 kg    Ideal Body Weight:  67.3 kg  BMI:  Body mass index is 45.3 kg/m.  Estimated Nutritional Needs:   Kcal:  2150-2350  Protein:  130-145 grams  Fluid:  1.5 L    Loistine Chance, RD, LDN, [place] Registered Dietitian II Certified Diabetes Care and Education Specialist Please refer to [facility] for RD and/or RD on-call/weekend/after hours pager

## 2020-04-15 NOTE — Progress Notes (Signed)
Physical Therapy Treatment Patient Details Name: Raymond Pratt MRN: [id number] DOB: 1950/10/04 Today's Date: 04/15/2020    History of Present Illness Zeth Sodergren is a 70 y.o. male with medical history significant of combined systolic and diastolic heart failure, atrial fibrillation, anemia, cellulitis, GI bleed, hypertension, OSA on CPAP, diabetes, nonobstructive CAD who presents with worsening shortness of breath. Found to have acute exacerbation of CHF.    PT Comments    Pt agreeable to participate. Session focused on seated exercises and functional mobility. Pt ambulating 10 ft, then an additional 50 ft with a walker and min guard assist. HR 120-136 bpm, SpO2 > 90% on 3L O2. Pt displays decreased cardiopulmonary endurance. D/c plan remains appropriate.    Follow Up Recommendations  Home health PT     Equipment Recommendations  None recommended by PT    Recommendations for Other Services       Precautions / Restrictions Precautions Precautions: Fall;Other (comment) Precaution Comments: watch HR, wound on left heel and front of ankle Restrictions Weight Bearing Restrictions: No    Mobility  Bed Mobility               General bed mobility comments: Sitting EOB upon arrival  Transfers Overall transfer level: Needs assistance Equipment used: Rolling walker (2 wheeled) Transfers: Sit to/from Stand Sit to Stand: Min guard         General transfer comment: Min guard for safety, use of momentum to power up  Ambulation/Gait Ambulation/Gait assistance: Min guard Gait Distance (Feet): 60 Feet (10", 50") Assistive device: Rolling walker (2 wheeled) Gait Pattern/deviations: Step-through pattern;Decreased stride length;Wide base of support Gait velocity: decreased Gait velocity interpretation: <1.8 ft/sec, indicate of risk for recurrent falls General Gait Details: Pt ambulating 10 ft, then 50 ft with a seated rest break in between. Min guard for  safety, DOE 3/4   Stairs             Wheelchair Mobility    Modified Rankin (Stroke Patients Only)       Balance Overall balance assessment: Needs assistance Sitting-balance support: Feet supported;No upper extremity supported Sitting balance-Leahy Scale: Good     Standing balance support: Bilateral upper extremity supported;During functional activity Standing balance-Leahy Scale: Poor Standing balance comment: reliant on external support                            Cognition Arousal/Alertness: Awake/alert Behavior During Therapy: WFL for tasks assessed/performed Overall Cognitive Status: Within Functional Limits for tasks assessed                                        Exercises General Exercises - Lower Extremity Long Arc Quad: Both;10 reps;Seated Hip Flexion/Marching: Both;10 reps;Seated    General Comments        Pertinent Vitals/Pain Pain Assessment: Faces Faces Pain Scale: No hurt    Home Living                      Prior Function            PT Goals (current goals can now be found in the care plan section) Acute Rehab PT Goals Patient Stated Goal: feel better Potential to Achieve Goals: Good Progress towards PT goals: Progressing toward goals    Frequency    Min 3X/week  PT Plan Current plan remains appropriate    Co-evaluation              AM-PAC PT "6 Clicks" Mobility   Outcome Measure  Help needed turning from your back to your side while in a flat bed without using bedrails?: None Help needed moving from lying on your back to sitting on the side of a flat bed without using bedrails?: None Help needed moving to and from a bed to a chair (including a wheelchair)?: A Little Help needed standing up from a chair using your arms (e.g., wheelchair or bedside chair)?: A Little Help needed to walk in hospital room?: A Little Help needed climbing 3-5 steps with a railing? : A Lot 6 Click  Score: 19    End of Session Equipment Utilized During Treatment: Oxygen Activity Tolerance: Patient tolerated treatment well Patient left: in chair;with call bell/phone within reach Nurse Communication: Mobility status PT Visit Diagnosis: Difficulty in walking, not elsewhere classified (R26.2)     Time: 1240-1306 PT Time Calculation (min) (ACUTE ONLY): 26 min  Charges:  $Therapeutic Activity: 23-37 mins                     Wyona Almas, PT, DPT Acute Rehabilitation Services Pager [phone] Office [phone]    Deno Etienne 04/15/2020, 2:15 PM

## 2020-04-16 DIAGNOSIS — I5023 Acute on chronic systolic (congestive) heart failure: Secondary | ICD-10-CM | POA: Diagnosis not present

## 2020-04-16 DIAGNOSIS — A4150 Gram-negative sepsis, unspecified: Secondary | ICD-10-CM | POA: Diagnosis not present

## 2020-04-16 DIAGNOSIS — R509 Fever, unspecified: Secondary | ICD-10-CM

## 2020-04-16 DIAGNOSIS — B9689 Other specified bacterial agents as the cause of diseases classified elsewhere: Secondary | ICD-10-CM | POA: Diagnosis not present

## 2020-04-16 DIAGNOSIS — J9601 Acute respiratory failure with hypoxia: Secondary | ICD-10-CM | POA: Diagnosis not present

## 2020-04-16 DIAGNOSIS — R7881 Bacteremia: Secondary | ICD-10-CM | POA: Diagnosis not present

## 2020-04-16 DIAGNOSIS — D6489 Other specified anemias: Secondary | ICD-10-CM | POA: Diagnosis not present

## 2020-04-16 DIAGNOSIS — I4819 Other persistent atrial fibrillation: Secondary | ICD-10-CM | POA: Diagnosis not present

## 2020-04-16 LAB — BASIC METABOLIC PANEL
Anion gap: 13 (ref 5–15)
BUN: 65 mg/dL — ABNORMAL HIGH (ref 8–23)
CO2: 27 mmol/L (ref 22–32)
Calcium: 8.1 mg/dL — ABNORMAL LOW (ref 8.9–10.3)
Chloride: 89 mmol/L — ABNORMAL LOW (ref 98–111)
Creatinine, Ser: 2.63 mg/dL — ABNORMAL HIGH (ref 0.61–1.24)
GFR, Estimated: 26 mL/min — ABNORMAL LOW (ref >60)
Glucose, Bld: 326 mg/dL — ABNORMAL HIGH (ref 70–99)
Potassium: 4.6 mmol/L (ref 3.5–5.1)
Sodium: 129 mmol/L — ABNORMAL LOW (ref 135–145)

## 2020-04-16 LAB — CBC
HCT: 28.9 % — ABNORMAL LOW (ref 39.0–52.0)
Hemoglobin: 8.3 g/dL — ABNORMAL LOW (ref 13.0–17.0)
MCH: 23.2 pg — ABNORMAL LOW (ref 26.0–34.0)
MCHC: 28.7 g/dL — ABNORMAL LOW (ref 30.0–36.0)
MCV: 81 fL (ref 80.0–100.0)
Platelets: 331 10*3/uL (ref 150–400)
RBC: 3.57 MIL/uL — ABNORMAL LOW (ref 4.22–5.81)
RDW: 25.2 % — ABNORMAL HIGH (ref 11.5–15.5)
WBC: 8.3 10*3/uL (ref 4.0–10.5)
nRBC: 0.2 % (ref 0.0–0.2)

## 2020-04-16 LAB — PROCALCITONIN: Procalcitonin: 1.63 ng/mL

## 2020-04-16 LAB — GLUCOSE, CAPILLARY
Glucose-Capillary: 254 mg/dL — ABNORMAL HIGH (ref 70–99)
Glucose-Capillary: 272 mg/dL — ABNORMAL HIGH (ref 70–99)
Glucose-Capillary: 238 mg/dL — ABNORMAL HIGH (ref 70–99)
Glucose-Capillary: 222 mg/dL — ABNORMAL HIGH (ref 70–99)

## 2020-04-16 LAB — COOXEMETRY PANEL
Carboxyhemoglobin: 1.4 % (ref 0.5–1.5)
Carboxyhemoglobin: 1.6 % — ABNORMAL HIGH (ref 0.5–1.5)
Methemoglobin: 0.8 % (ref 0.0–1.5)
Methemoglobin: 1.1 % (ref 0.0–1.5)
O2 Saturation: 39.9 %
O2 Saturation: 46.2 %
Total hemoglobin: 8.8 g/dL — ABNORMAL LOW (ref 12.0–16.0)
Total hemoglobin: 8.1 g/dL — ABNORMAL LOW (ref 12.0–16.0)

## 2020-04-16 LAB — MAGNESIUM: Magnesium: 2.6 mg/dL — ABNORMAL HIGH (ref 1.7–2.4)

## 2020-04-16 LAB — GLUCOSE, RANDOM: Glucose, Bld: 323 mg/dL — ABNORMAL HIGH (ref 70–99)

## 2020-04-16 MED ORDER — FAMOTIDINE 20 MG PO TABS
20.0000 mg | ORAL_TABLET | Freq: Every day | ORAL | Status: DC
  Administered 2020-04-17: 20 mg via ORAL
  Filled 2020-04-16: qty 1

## 2020-04-16 MED ORDER — SODIUM CHLORIDE 0.9 % IV SOLN: 510.0000 mg | INTRAVENOUS | Status: DC

## 2020-04-16 MED ORDER — INSULIN ASPART 100 UNIT/ML ~~LOC~~ SOLN: 8.0000 [IU] | Freq: Three times a day (TID) | SUBCUTANEOUS | Status: DC

## 2020-04-16 MED ORDER — INSULIN GLARGINE 100 UNIT/ML ~~LOC~~ SOLN
38.0000 [IU] | Freq: Two times a day (BID) | SUBCUTANEOUS | Status: DC
  Administered 2020-04-16 – 2020-04-17 (×2): 38 [IU] via SUBCUTANEOUS
  Filled 2020-04-16 (×3): qty 0.38

## 2020-04-16 MED ORDER — INSULIN ASPART 100 UNIT/ML ~~LOC~~ SOLN
0.0000 [IU] | Freq: Three times a day (TID) | SUBCUTANEOUS | Status: DC
  Administered 2020-04-17 (×2): 4 [IU] via SUBCUTANEOUS
  Administered 2020-04-17: 11:00:00 7 [IU] via SUBCUTANEOUS
  Administered 2020-04-18: 15 [IU] via SUBCUTANEOUS
  Administered 2020-04-18 (×2): 7 [IU] via SUBCUTANEOUS
  Administered 2020-04-19 (×2): 4 [IU] via SUBCUTANEOUS
  Administered 2020-04-19: 11 [IU] via SUBCUTANEOUS
  Administered 2020-04-20: 7 [IU] via SUBCUTANEOUS

## 2020-04-16 MED ORDER — INSULIN ASPART 100 UNIT/ML ~~LOC~~ SOLN
8.0000 [IU] | Freq: Three times a day (TID) | SUBCUTANEOUS | Status: DC
  Administered 2020-04-17 – 2020-04-18 (×5): 8 [IU] via SUBCUTANEOUS

## 2020-04-16 NOTE — Progress Notes (Signed)
Inpatient Diabetes Program Recommendations  AACE/ADA: New Consensus Statement on Inpatient Glycemic Control (2015)  Target Ranges:  Prepandial:   less than 140 mg/dL      Peak postprandial:   less than 180 mg/dL (1-2 hours)      Critically ill patients:  140 - 180 mg/dL   Lab Results  Component Value Date   GLUCAP 272 (H) 04/16/2020   HGBA1C 8.4 (H) 12/10/2019    Review of Glycemic Control Results for Raymond Pratt, Raymond Pratt (MRN [id number]) as of 04/16/2020 14:56  Ref. Range 04/15/2020 21:25 04/16/2020 06:05 04/16/2020 11:31 04/16/2020 14:13  Glucose-Capillary Latest Ref Range: 70 - 99 mg/dL 208 (H) 254 (H) >600 (HH) 272 (H)   Diabetes history: Type 2 DM Outpatient Diabetes medications: Novolog 70/30 60 units BID Current orders for Inpatient glycemic control: Lantus 30 units BID, Novolog 5 units TID, Novolog 0-20 units TID  Inpatient Diabetes Program Recommendations:    Consider further increasing Lantus to 40 units BID and increasing Novolog to 10 units TID (assuming pt is consuming >50% of meal).   Thanks, Bronson Curb, MSN, RNC-OB Diabetes Coordinator [phone] (8a-5p)

## 2020-04-16 NOTE — Progress Notes (Signed)
[facility] for Infectious Disease   Reason for visit: Follow up on Serratia bacteremia  Interval History: second blood culture now positive as well with GNR.  Afebrile about 24 hours.  WBC wnl.  Cefepime day 3   Physical Exam: Constitutional:  Vitals:   04/16/20 0212 04/16/20 0426  BP: 109/77 114/74  Pulse: (!) 120 (!) 115  Resp: (!) 23 17  Temp: 98.2 F (36.8 C) 98 F (36.7 C)  SpO2: 90% 94%   patient appears in NAD Respiratory: Normal respiratory effort; CTA B Cardiovascular: RRR Lines: picc line with no surrounding erythema  Review of Systems: Constitutional: negative for fevers and chills Gastrointestinal: negative for diarrhea  Lab Results  Component Value Date   WBC 8.3 04/16/2020   HGB 8.3 (L) 04/16/2020   HCT 28.9 (L) 04/16/2020   MCV 81.0 04/16/2020   PLT 331 04/16/2020    Lab Results  Component Value Date   CREATININE 2.09 (H) 04/15/2020   BUN 47 (H) 04/15/2020   NA 127 (L) 04/15/2020   K 4.7 04/15/2020   CL 86 (L) 04/15/2020   CO2 28 04/15/2020    Lab Results  Component Value Date   ALT 22 04/15/2020   AST 28 04/15/2020   ALKPHOS 71 04/15/2020     Microbiology: Recent Results (from the past 240 hour(s))  Culture, blood (routine x 2)     Status: Abnormal (Preliminary result)   Collection Time: 04/14/20  9:37 AM   Specimen: BLOOD  Result Value Ref Range Status   Specimen Description BLOOD RIGHT ANTECUBITAL  Final   Special Requests   Final    BOTTLES DRAWN AEROBIC AND ANAEROBIC Blood Culture results may not be optimal due to an inadequate volume of blood received in culture bottles   Culture  Setup Time   Final    CORRECTED RESULTS GRAM NEGATIVE RODS PREVIOUSLY REPORTED AS: GRAM NEGATIVE COCCI CORRECTED RESULTS CALLED TO: PHARMD LYDIA CHEN [id number] FCP    Culture (A)  Final    SERRATIA MARCESCENS SUSCEPTIBILITIES TO FOLLOW Performed at [facility], [address], [place], [place] [address]    Report Status PENDING   Incomplete  Blood Culture ID Panel (Reflexed)     Status: Abnormal   Collection Time: 04/14/20  9:37 AM  Result Value Ref Range Status   Enterococcus faecalis NOT DETECTED NOT DETECTED Final   Enterococcus Faecium NOT DETECTED NOT DETECTED Final   Listeria monocytogenes NOT DETECTED NOT DETECTED Final   Staphylococcus species NOT DETECTED NOT DETECTED Final   Staphylococcus aureus (BCID) NOT DETECTED NOT DETECTED Final   Staphylococcus epidermidis NOT DETECTED NOT DETECTED Final   Staphylococcus lugdunensis NOT DETECTED NOT DETECTED Final   Streptococcus species NOT DETECTED NOT DETECTED Final   Streptococcus agalactiae NOT DETECTED NOT DETECTED Final   Streptococcus pneumoniae NOT DETECTED NOT DETECTED Final   Streptococcus pyogenes NOT DETECTED NOT DETECTED Final   A.calcoaceticus-baumannii NOT DETECTED NOT DETECTED Final   Bacteroides fragilis NOT DETECTED NOT DETECTED Final   Enterobacterales DETECTED (A) NOT DETECTED Final    Comment: Enterobacterales represent a large order of gram negative bacteria, not a single organism. CRITICAL RESULT CALLED TO, READ BACK BY AND VERIFIED WITH: PHARMD AMEND C. BY MESSAN H. AT 0320 ON  04/15/2020    Enterobacter cloacae complex NOT DETECTED NOT DETECTED Final   Escherichia coli NOT DETECTED NOT DETECTED Final   Klebsiella aerogenes NOT DETECTED NOT DETECTED Final   Klebsiella oxytoca NOT DETECTED NOT  DETECTED Final   Klebsiella pneumoniae NOT DETECTED NOT DETECTED Final   Proteus species NOT DETECTED NOT DETECTED Final   Salmonella species NOT DETECTED NOT DETECTED Final   Serratia marcescens DETECTED (A) NOT DETECTED Final    Comment: CRITICAL RESULT CALLED TO, READ BACK BY AND VERIFIED WITH: PHARMD AMEND C. BY MESSAN H. AT 0320 ON  04/15/2020    Haemophilus influenzae NOT DETECTED NOT DETECTED Final   Neisseria meningitidis NOT DETECTED NOT DETECTED Final   Pseudomonas aeruginosa NOT DETECTED NOT DETECTED Final   Stenotrophomonas maltophilia  NOT DETECTED NOT DETECTED Final   Candida albicans NOT DETECTED NOT DETECTED Final   Candida auris NOT DETECTED NOT DETECTED Final   Candida glabrata NOT DETECTED NOT DETECTED Final   Candida krusei NOT DETECTED NOT DETECTED Final   Candida parapsilosis NOT DETECTED NOT DETECTED Final   Candida tropicalis NOT DETECTED NOT DETECTED Final   Cryptococcus neoformans/gattii NOT DETECTED NOT DETECTED Final   CTX-M ESBL NOT DETECTED NOT DETECTED Final   Carbapenem resistance IMP NOT DETECTED NOT DETECTED Final   Carbapenem resistance KPC NOT DETECTED NOT DETECTED Final   Carbapenem resistance NDM NOT DETECTED NOT DETECTED Final   Carbapenem resist OXA 48 LIKE NOT DETECTED NOT DETECTED Final   Carbapenem resistance VIM NOT DETECTED NOT DETECTED Final    Comment: Performed at [facility], [address], [place], [place] [address]  Culture, blood (routine x 2)     Status: None (Preliminary result)   Collection Time: 04/14/20  9:42 AM   Specimen: BLOOD RIGHT HAND  Result Value Ref Range Status   Specimen Description BLOOD RIGHT HAND  Final   Special Requests   Final    BOTTLES DRAWN AEROBIC AND ANAEROBIC Blood Culture results may not be optimal due to an inadequate volume of blood received in culture bottles   Culture  Setup Time   Final    CORRECTED RESULTS GRAM NEGATIVE RODS PREVIOUSLY REPORTED AS: GRAM NEGATIVE COCCI CORRECTED RESULTS CALLED TO: Justice Britain CHEN 0749 [id number] FCP    Culture   Final    NO GROWTH 2 DAYS Performed at [facility], [address] [place], [place] [address]    Report Status PENDING  Incomplete    Impression/Plan:  1. Serratia bacteremia - on cefepime and will continue for likely 7 day course.  On cefepime and no indication to remove the picc line.  Concern for Amp c resistance so will continue with cefepime and not narrow.    2.  Fever - trending down.    3.  Anemia - on iron infusions chronically.  Holding for this week due to bacteremia.

## 2020-04-16 NOTE — Progress Notes (Addendum)
Advanced Heart Failure Rounding Note   Subjective:    2/7 febrile. Blood Cx obtained. Started antibiotics.   Bld Cx 1/2 Serratia Marcescens + enterobacterales. ID following. On cefepime. mTemp past 24 hrs 100.8. WBC 8.3. PCT 1.63   Milrinone 0.25 mcg. Co-ox low at 40%. Repeat Co-ox 46%.   Only 600 cc in UOP charted yesterday but ? If accurate. Has had issues w/ incontinence. Wt down another 4 lb. Unable to get CVP reading. 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   Objective:   Weight Range:  Vital Signs:   Temp:  [97.5 F (36.4 C)-100.8 F (38.2 C)] 98 F (36.7 C) (02/09 0426) Pulse Rate:  [115-130] 115 (02/09 0426) Resp:  [17-25] 17 (02/09 0426) BP: (98-131)/(65-90) 114/74 (02/09 0426) SpO2:  [90 %-96 %] 94 % (02/09 0426) Weight:  [129.6 kg] 129.6 kg (02/09 0227) Last BM Date: 04/15/20  Weight change: Filed Weights   04/14/20 0400 04/15/20 0129 04/16/20 0227  Weight: 131 kg 131.2 kg 129.6 kg    Intake/Output:   Intake/Output Summary (Last 24 hours) at 04/16/2020 1016 Last data filed at 04/16/2020 1000 Gross per 24 hour  Intake 1629.5 ml  Output 600 ml  Net 1029.5 ml    PHYSICAL EXAM: General:  Chronically ill appearing, obese WM, fatigue appearing but no distress HEENT: normal Neck: supple. Thick neck, JVD not well visualized . Carotids 2+ bilat; no bruits. No lymphadenopathy or thryomegaly appreciated. Cor: PMI nondisplaced. Irregular rate & rhythm, tachy. No rubs, gallops or murmurs. Lungs: decreased BS at the bases bilaterally  Abdomen: obese soft, nontender, nondistended. No hepatosplenomegaly. No bruits or masses. Good bowel sounds. Extremities: no cyanosis, clubbing, rash, trace bilateral LEE edema bilateral chronic venous stasis dermatitis  Neuro: alert & orientedx3, cranial nerves grossly intact. moves all 4 extremities w/o difficulty. Affect pleasant   Telemetry:  A fib  120-130s   Labs: Basic Metabolic Panel: Recent Labs  Lab 04/10/20 0349 04/11/20 0500 04/12/20 0550 04/13/20 0623 04/14/20 0443 04/15/20 0429 04/15/20 0929 04/16/20 0500  NA 134* 137 136 132* 130* 129* 127*  --   K 4.1 3.8 4.6 4.2 4.4 4.8 4.7  --   CL 90* 94* 93* 88* 86* 89* 86*  --   CO2 32 32 32 '31 31 29 28  '$ --   GLUCOSE 354* 169* 148* 294* 333* 215* 412*  --   BUN 34* 32* 31* 37* 41* 46* 47*  --   CREATININE 1.98* 1.67* 1.87* 1.86* 1.68* 2.07* 2.09*  --   CALCIUM 8.7* 8.8* 9.0 8.4* 8.5* 8.4* 7.9*  --   MG 2.5* 2.5*  --  2.3  --  2.4  --  2.6*    Liver Function Tests: Recent Labs  Lab 04/14/20 0443 04/15/20 0429  AST 22 28  ALT 18 22  ALKPHOS 61 71  BILITOT 0.8 1.0  PROT 6.9 6.4*  ALBUMIN 2.8* 2.7*   No results for input(s): LIPASE, AMYLASE in the last 168 hours. No results for input(s): AMMONIA in the last 168 hours.  CBC: Recent Labs  Lab 04/13/20 0623 04/14/20 1433 04/15/20 0429 04/16/20 0500  WBC 7.9 8.3 8.4 8.3  NEUTROABS  --  6.5  --   --   HGB 9.5* 10.0* 8.5* 8.3*  HCT 33.4* 34.7* 28.1* 28.9*  MCV 82.1 82.4 80.1 81.0  PLT 389 399 341 331    Cardiac  Enzymes: No results for input(s): CKTOTAL, CKMB, CKMBINDEX, TROPONINI in the last 168 hours.  BNP: BNP (last 3 results) Recent Labs    12/10/19 0057 02/13/20 0807 03/26/2020 2019  BNP 154.7* 111.6* 195.0*    ProBNP (last 3 results) No results for input(s): PROBNP in the last 8760 hours.    Other results:  Imaging: DG CHEST PORT 1 VIEW  Result Date: 04/14/2020 CLINICAL DATA:  Pneumonia EXAM: PORTABLE CHEST 1 VIEW COMPARISON:  04/09/2020 FINDINGS: Pulmonary vascular congestion. Interstitial prominence. Increased patchy left lung opacities. No significant pleural effusion. No pneumothorax. Stable cardiomegaly. PICC line remains present. IMPRESSION: Increased patchy left lung opacities superimposed on interstitial prominence and pulmonary vascular congestion. Asymmetric pulmonary edema and  pneumonia are considerations. Electronically Signed   By: Macy Mis M.D.   On: 04/14/2020 14:39     Medications:     Scheduled Medications: . (feeding supplement) PROSource Plus  30 mL Oral TID BM  . apixaban  5 mg Oral BID  . atorvastatin  40 mg Oral Daily  . Chlorhexidine Gluconate Cloth  6 each Topical Daily  . insulin aspart  0-20 Units Subcutaneous TID WC  . insulin aspart  5 Units Subcutaneous TID WC  . insulin glargine  30 Units Subcutaneous BID  . metoCLOPramide  10 mg Oral TID AC & HS  . multivitamin with minerals  1 tablet Oral Daily  . sodium chloride flush  3 mL Intravenous Q12H  . spironolactone  12.5 mg Oral Daily    Infusions: . amiodarone 60 mg/hr (04/16/20 0653)  . ceFEPime (MAXIPIME) IV 2 g (04/16/20 0655)  . famotidine (PEPCID) IV 20 mg (04/15/20 1108)  . [START ON 04/23/2020] ferumoxytol    . milrinone 0.25 mcg/kg/min (04/16/20 0225)    PRN Medications: acetaminophen **OR** acetaminophen, alum & mag hydroxide-simeth, [COMPLETED] diphenhydrAMINE **FOLLOWED BY** diphenhydrAMINE, hydrocortisone cream, hydrOXYzine, Muscle Rub, ondansetron (ZOFRAN) IV, polyethylene glycol, sodium chloride flush   Assessment/Plan:   1.  Acute on chronic systolic heart failure - Echo (11/2018) EF 25-30%, trace MR, trivial TR, moderately reduced RSVF.   - Cath (12/2018) RA 10, RV 55/8, PA 54/15 (27), PCW = 16, Fick CO/CI = 7.0/2.8 - Echo (04/04/2020) EF 45-50%, mild LVH, hypokinesis LV, dilated LA, dilated RV/RA, mod MR, mod-severe TR, RVSP > 60 mmHg.  - Milrinone started on 1/29 for low CO-OX.  - Over the weekend milrinone was increased 0.25 mcg due to low  CO-OX .  - Todays CO-OX 46% on milrinone 0.25 mcg. - Hold diuretics for now w/ AKI but volume status trending up on exam. Will try to re-calibrate CVP. Likely resume diuretics tomorrow   - Likely triggered by AF, monitor closely while on inotropes  - Holding Entresto and dig for now.  - Hold spiro for now given AKI. K  4.6   2. Persistent AF - has been on amio at home - suspect has been in AF for several weeks. ECG in 12/21 was NSR - Last week he went back in NSR but over the weekend back in A fib RVR.  - Will need cardioversion, once infection clears.  - Continue amio to 60 mg per hour.   - TFTs not too bad - continue Eliquis  3. Gram-negative sepsis -2/7 Fever. Lactic Acid 2.2>1.2  - WBC not elevated.  - PCT 1.63 -2/7Bld Cx 1/2-Serratia Marcescens + enterobacterales - Antibiotics switched to cefepime . ID following  - UA negative   4. Acute on chronic hypoxic respiratory failure/OSA - Continue with  CPAP at night for OSA - He has been refusing CPAP.   5. AKI on CKD, Stage 3b - Baseline Cr 1.5-1.7 - SCr 1.7 >2.1>>2.6  - Continue milrinone support.  - Hold diuretics today. Likely resume tomorrow as fluid status is trending up - Stop Arlyce Harman - Follow BMP    6. Venous stasis wounds L tib/fib, L heel & Sacral wound - Wound care consulted. Primary team managing   7. T2DM - A1c 12.4% recent hospitalization - SSI Per primary team - Would be concerned about SGT2i with mild erythema under pannus  8. Morbidly obese Body mass index is 44.75 kg/m.  9. Lung nodule - Will need outpatient imaging in 3 months, 9 mm CT (03/2020)  10. Hypokalemia/ Hypomagnesemia -K /Mag stable.  - Keep K> 4.0 Mg > 2.0   11. Hyponatremia - Sodium 127. Restrict free water.    12. Anemia/Hx of GIB - Hgb stable  9.8 on CO-OX   - EGD 12/2019 nonbleeding duodenal ulcer with gastritis - Received Feraheme 04/09/20  - Hgb 8.3 today. No obvious source.     Length of Stay: [address] Ladoris Gene  04/16/2020, 10:16 AM  Advanced Heart Failure Team Pager [phone] (M-F; 7a - 4p)  Please contact [facility] for night-coverage after hours (4p -7a ) and weekends on [email or web address]   Patient seen and examined with the above-signed Advanced Practice Provider and/or Housestaff. I personally reviewed laboratory  data, imaging studies and relevant notes. I independently examined the patient and formulated the important aspects of the plan. I have edited the note to reflect any of my changes or salient points. I have personally discussed the plan with the patient and/or family.  Lying in bed. Says he feels better. On milrinone co-ox in 40s.  Remains in AF with RVR Remains on abx for GN sepsis. Creatinine still going up   General:  Lying flat in bed. Chronically ill appearing No resp difficulty HEENT: normal Neck: supple. JVP up  Carotids 2+ bilat; no bruits. No lymphadenopathy or thryomegaly appreciated. Cor: PMI nondisplaced. IRRegular rate & rhythm. No rubs, gallops or murmurs. Lungs: clear Abdomen: obsese soft, nontender, nondistended. No hepatosplenomegaly. No bruits or masses. Good bowel sounds. Extremities: no cyanosis, clubbing, rash, 2+ edema Neuro: alert & orientedx3, cranial nerves grossly intact. moves all 4 extremities w/o difficulty. Affect pleasant   Remains extremely tenuous with low output HF, PAF and recent GN sepsis and AKI. Symptomatically now improving. Volume status going back up but would wait one more day to restart diuretics given AKI/ATN from sepsis. Will eventually need TEE/DC-CV if we can get him stable enough.   [name], MD  5:04 PM

## 2020-04-16 NOTE — Progress Notes (Signed)
Serum glucose and co0x collected and sent to lab.

## 2020-04-16 NOTE — Progress Notes (Signed)
Patient states that he did urinate tonight but urine was lost in bedside commode during bowel movements. Also states that there was a partially full urinal gesturing as to how full but this remains unaccounted for.

## 2020-04-16 NOTE — Progress Notes (Signed)
Pt refused cpap for tonight 

## 2020-04-16 NOTE — Progress Notes (Signed)
   04/15/20 2150  Assess: MEWS Score  Temp 98.5 F (36.9 C)  BP 111/73  Pulse Rate (!) 122  ECG Heart Rate (!) 124  Resp (!) 21  SpO2 96 %  Assess: MEWS Score  MEWS Temp 0  MEWS Systolic 0  MEWS Pulse 2  MEWS RR 1  MEWS LOC 0  MEWS Score 3  MEWS Score Color Yellow  Assess: if the MEWS score is Yellow or Red  Were vital signs taken at a resting state? Yes  Focused Assessment No change from prior assessment  Early Detection of Sepsis Score *See Row Information* Medium  MEWS guidelines implemented *See Row Information* No, previously yellow, continue vital signs every 4 hours  Document  Patient Outcome Other (Comment)  Progress note created (see row info) Yes

## 2020-04-16 NOTE — Progress Notes (Signed)
Physical Therapy Treatment Patient Details Name: Raymond Pratt MRN: [id number] DOB: September 30, 1950 Today's Date: 04/16/2020    History of Present Illness Raymond Pratt is a 70 y.o. male with medical history significant of combined systolic and diastolic heart failure, atrial fibrillation, anemia, cellulitis, GI bleed, hypertension, OSA on CPAP, diabetes, nonobstructive CAD who presents with worsening shortness of breath. Found to have acute exacerbation of CHF.    PT Comments    Pt with improved activity tolerance today. Ambulating 50 ft, then 20 ft with a walker and seated rest break in between bouts. HR 120-130's afib, SpO2 92% on 3L O2. Will continue to progress mobility as tolerated.    Follow Up Recommendations  Home health PT     Equipment Recommendations  None recommended by PT    Recommendations for Other Services       Precautions / Restrictions Precautions Precautions: Fall;Other (comment) Precaution Comments: watch HR, wound on left heel and front of ankle Restrictions Weight Bearing Restrictions: No    Mobility  Bed Mobility               General bed mobility comments: Sitting EOB upon arrival  Transfers Overall transfer level: Needs assistance Equipment used: Rolling walker (2 wheeled) Transfers: Sit to/from Stand Sit to Stand: Min guard         General transfer comment: Min guard for safety, use of momentum to power up  Ambulation/Gait Ambulation/Gait assistance: Min guard Gait Distance (Feet): 70 Feet (50", 20") Assistive device: Rolling walker (2 wheeled) Gait Pattern/deviations: Step-through pattern;Decreased stride length;Wide base of support Gait velocity: decreased Gait velocity interpretation: <1.8 ft/sec, indicate of risk for recurrent falls General Gait Details: Pt ambulating 50 ft, then 20 ft with a seated rest break in between, min guard for safety.   Stairs             Wheelchair Mobility    Modified  Rankin (Stroke Patients Only)       Balance Overall balance assessment: Needs assistance Sitting-balance support: Feet supported;No upper extremity supported Sitting balance-Leahy Scale: Good     Standing balance support: Bilateral upper extremity supported;During functional activity Standing balance-Leahy Scale: Poor Standing balance comment: reliant on external support                            Cognition Arousal/Alertness: Awake/alert Behavior During Therapy: WFL for tasks assessed/performed Overall Cognitive Status: Within Functional Limits for tasks assessed                                        Exercises      General Comments        Pertinent Vitals/Pain Pain Assessment: Faces Faces Pain Scale: No hurt    Home Living                      Prior Function            PT Goals (current goals can now be found in the care plan section) Acute Rehab PT Goals Patient Stated Goal: feel better Potential to Achieve Goals: Good Progress towards PT goals: Progressing toward goals    Frequency    Min 3X/week      PT Plan Current plan remains appropriate    Co-evaluation  AM-PAC PT "6 Clicks" Mobility   Outcome Measure  Help needed turning from your back to your side while in a flat bed without using bedrails?: None Help needed moving from lying on your back to sitting on the side of a flat bed without using bedrails?: None Help needed moving to and from a bed to a chair (including a wheelchair)?: A Little Help needed standing up from a chair using your arms (e.g., wheelchair or bedside chair)?: A Little Help needed to walk in hospital room?: A Little Help needed climbing 3-5 steps with a railing? : A Lot 6 Click Score: 19    End of Session Equipment Utilized During Treatment: Oxygen Activity Tolerance: Patient tolerated treatment well Patient left: with call bell/phone within reach;in bed Nurse  Communication: Mobility status PT Visit Diagnosis: Difficulty in walking, not elsewhere classified (R26.2)     Time: [id number] PT Time Calculation (min) (ACUTE ONLY): 28 min  Charges:  $Therapeutic Activity: 23-37 mins                     Wyona Almas, PT, DPT Acute Rehabilitation Services Pager [phone] Office [phone]    Deno Etienne 04/16/2020, 12:34 PM

## 2020-04-16 NOTE — Progress Notes (Addendum)
PROGRESS NOTE    Raymond Pratt  [id number] DOB: [birth year] DOA: 03/18/2020 PCP: Shon Baton, MD    Chief Complaint  Patient presents with  . Shortness of Breath    CHF    Brief Narrative:  70 year old gentleman with prior history of chronic systolic and diastolic heart failure, atrial fibrillation on anticoagulation, hypertension, obstructive sleep apnea on CPAP, diabetes mellitus, coronary artery disease presents to ED with worsening shortness of breath.  On arrival to ED was found to be hypoxic requiring up to 4 L of nasal cannula oxygen.  Chest x-ray showed pulmonary vascular congestion. He was admitted for acute hypoxic respiratory failure secondary to acute on chronic combined CHF.  Assessment & Plan:   Principal Problem:   Acute exacerbation of CHF (congestive heart failure) (HCC) Active Problems:   Type 2 diabetes mellitus with hyperlipidemia (HCC)   OSA on CPAP   Anticoagulated on Coumadin   HTN (hypertension)   A-fib (HCC)   Anemia   Atrial fibrillation with RVR (HCC)   Pressure injury of skin   Acute respiratory failure with hypoxia (HCC)   Gram-negative sepsis (HCC)   Acute hypoxic respiratory failure secondary to acute on chronic systolic and diastolic heart failure requiring up to 2 L of nasal cannula oxygen to keep sats greater than 90%. Chest x-ray on admission shows pulmonary vascular congestion and perihilar edema. Echocardiogram shows left ventricular ejection fraction of 45 to 50% with hypokinesis, dilated right ventricle and right atrium, moderate MR moderate to severe TR.  Cardiology on board recommend to continue milrinone.   Strict intake and output and daily weights. Fluid status net negative of 14 L since admission. Holding diuretics due to AKI. Holding Entresto and digoxin for AKI.    Persistent atrial fibrillation Rate not well controlled and currently on IV amiodarone.  Patient will need TEE DCCV once bacteremia/infection  clears. On Eliquis for anticoagulation.    Sepsis in the setting of Serratia bacteremia Repeat blood cultures positive for GNR. Patient currently on cefepime. Febrile overnight with a fever of 100.8. ID on board and appreciate recommendations to continue with cefepime for 7-day course.    Essential hypertension Blood pressure parameters appear to be optimal.   Anemia of chronic disease Hemo-globin stable around 8.3 Transfuse to keep hemoglobin greater than 7.    Hyponatremia probably from overdiuresis Repeat BMP this morning.   Controlled diabetes mellitus with hyperglycemia Hemoglobin A1c around 12.4. CBG (last 3)  Recent Labs    04/15/20 2125 04/16/20 0605 04/16/20 1131  GLUCAP 208* 254* >600*   Get stat BMP, on Lantus 30 units twice daily, 5 units of 3 times daily before meals and resistance sliding scale insulin.   Body mass index is 44.75 kg/m. Morbid obesity Poor prognostic factor   Pressure injury stage II pressure injury present on right buttocks,  present on admission:  Pressure Injury 04/04/20 Heel Left Stage 3 -  Full thickness tissue loss. Subcutaneous fat may be visible but bone, tendon or muscle are NOT exposed. (Active)  04/04/20   Location: Heel  Location Orientation: Left  Staging: Stage 3 -  Full thickness tissue loss. Subcutaneous fat may be visible but bone, tendon or muscle are NOT exposed.  Wound Description (Comments):   Present on Admission: Yes     Pressure Injury 04/04/20 Left Stage 3 -  Full thickness tissue loss. Subcutaneous fat may be visible but bone, tendon or muscle are NOT exposed. (Active)  04/04/20   Location:   Location Orientation:  Left  Staging: Stage 3 -  Full thickness tissue loss. Subcutaneous fat may be visible but bone, tendon or muscle are NOT exposed.  Wound Description (Comments):   Present on Admission: Yes     Pressure Injury 04/04/20 Buttocks Right Stage 2 -  Partial thickness loss of dermis presenting  as a shallow open injury with a red, pink wound bed without slough. (Active)  04/04/20   Location: Buttocks  Location Orientation: Right  Staging: Stage 2 -  Partial thickness loss of dermis presenting as a shallow open injury with a red, pink wound bed without slough.  Wound Description (Comments):   Present on Admission: Yes   Wound care consulted and recommendations given.   Acute on chronic stage IIIb CKD Baseline creatinine around 1.7, creatinine increased to greater than 2 with diuresis. Holding Lasix, Aldactone, S Entresto at this time.  Repeat BMP today    Chronic bilateral venous stasis dermatitis Continue with wound care.    Lung nodule noted on the CT scan Recommend outpatient follow-up with PCP    Hypokalemia and hypomagnesemia Replaced   History of coronary artery disease Patient currently denies any chest pain.    DVT prophylaxis: eliquis.  Code Status: Full code.  Family Communication:  None at bedside.  Disposition:   Status is: Inpatient  Remains inpatient appropriate because:Ongoing diagnostic testing needed not appropriate for outpatient work up, Unsafe d/c plan, IV treatments appropriate due to intensity of illness or inability to take PO and Inpatient level of care appropriate due to severity of illness   Dispo: The patient is from: Home              Anticipated d/c is to: pending.               Anticipated d/c date is: > 3 days              Patient currently is not medically stable to d/c.   Difficult to place patient No       Level of care: Telemetry Cardiac Consultants:   Cardiology.   Procedures:  Antimicrobials: Antibiotics Given (last 72 hours)    Date/Time Action Medication Dose Rate   04/14/20 1724 New Bag/Given   piperacillin-tazobactam (ZOSYN) IVPB 3.375 g 3.375 g 12.5 mL/hr   04/14/20 1853 New Bag/Given   vancomycin (VANCOCIN) 2,500 mg in sodium chloride 0.9 % 500 mL IVPB 2,500 mg 250 mL/hr   04/14/20 2116 New  Bag/Given   piperacillin-tazobactam (ZOSYN) IVPB 3.375 g 3.375 g 12.5 mL/hr   04/15/20 0526 New Bag/Given   ceFEPIme (MAXIPIME) 2 g in sodium chloride 0.9 % 100 mL IVPB 2 g 200 mL/hr   04/15/20 1712 New Bag/Given   ceFEPIme (MAXIPIME) 2 g in sodium chloride 0.9 % 100 mL IVPB 2 g 200 mL/hr   04/16/20 0655 New Bag/Given   ceFEPIme (MAXIPIME) 2 g in sodium chloride 0.9 % 100 mL IVPB 2 g 200 mL/hr       Subjective: Sleepy. No new complaints.   Objective: Vitals:   04/15/20 2150 04/16/20 0212 04/16/20 0227 04/16/20 0426  BP: 111/73 109/77  114/74  Pulse: (!) 122 (!) 120  (!) 115  Resp: (!) 21 (!) 23  17  Temp: 98.5 F (36.9 C) 98.2 F (36.8 C)  98 F (36.7 C)  TempSrc: Oral Oral  Oral  SpO2: 96% 90%  94%  Weight:   129.6 kg   Height:        Intake/Output Summary (Last 24  hours) at 04/16/2020 1032 Last data filed at 04/16/2020 1000 Gross per 24 hour  Intake 1629.5 ml  Output 600 ml  Net 1029.5 ml   Filed Weights   04/14/20 0400 04/15/20 0129 04/16/20 0227  Weight: 131 kg 131.2 kg 129.6 kg    Examination:  General exam:Ill appearing gentleman, not in distress.  Respiratory system: Diminished air entry at bases on nasal cannula oxygen Cardiovascular system: S1 & S2 heard, irregularly irregular, pedal edema present  gastrointestinal system: Abdomen is nondistended, soft and nontender. Normal bowel sounds heard. Central nervous system: Alert and oriented. No focal neurological deficits. Extremities: Symmetric 5 x 5 power. Skin: Pressure injury on the right buttocks Psychiatry: Mood & affect appropriate.     Data Reviewed: I have personally reviewed following labs and imaging studies  CBC: Recent Labs  Lab 04/13/20 0623 04/14/20 1433 04/15/20 0429 04/16/20 0500  WBC 7.9 8.3 8.4 8.3  NEUTROABS  --  6.5  --   --   HGB 9.5* 10.0* 8.5* 8.3*  HCT 33.4* 34.7* 28.1* 28.9*  MCV 82.1 82.4 80.1 81.0  PLT 389 399 341 [id number]    Basic Metabolic Panel: Recent Labs  Lab  04/10/20 0349 04/11/20 0500 04/12/20 0550 04/13/20 0623 04/14/20 0443 04/15/20 0429 04/15/20 0929 04/16/20 0500  NA 134* 137 136 132* 130* 129* 127*  --   K 4.1 3.8 4.6 4.2 4.4 4.8 4.7  --   CL 90* 94* 93* 88* 86* 89* 86*  --   CO2 32 32 32 '31 31 29 28  '$ --   GLUCOSE 354* 169* 148* 294* 333* 215* 412*  --   BUN 34* 32* 31* 37* 41* 46* 47*  --   CREATININE 1.98* 1.67* 1.87* 1.86* 1.68* 2.07* 2.09*  --   CALCIUM 8.7* 8.8* 9.0 8.4* 8.5* 8.4* 7.9*  --   MG 2.5* 2.5*  --  2.3  --  2.4  --  2.6*    GFR: Estimated Creatinine Clearance: 43.8 mL/min (A) (by C-G formula based on SCr of 2.09 mg/dL (H)).  Liver Function Tests: Recent Labs  Lab 04/14/20 0443 04/15/20 0429  AST 22 28  ALT 18 22  ALKPHOS 61 71  BILITOT 0.8 1.0  PROT 6.9 6.4*  ALBUMIN 2.8* 2.7*    CBG: Recent Labs  Lab 04/15/20 0609 04/15/20 1159 04/15/20 1634 04/15/20 2125 04/16/20 0605  GLUCAP 200* 299* 241* 208* 254*     Recent Results (from the past 240 hour(s))  Culture, blood (routine x 2)     Status: Abnormal (Preliminary result)   Collection Time: 04/14/20  9:37 AM   Specimen: BLOOD  Result Value Ref Range Status   Specimen Description BLOOD RIGHT ANTECUBITAL  Final   Special Requests   Final    BOTTLES DRAWN AEROBIC AND ANAEROBIC Blood Culture results may not be optimal due to an inadequate volume of blood received in culture bottles   Culture  Setup Time   Final    CORRECTED RESULTS GRAM NEGATIVE RODS PREVIOUSLY REPORTED AS: GRAM NEGATIVE COCCI CORRECTED RESULTS CALLED TO: PHARMD LYDIA CHEN 0749 [id number] FCP    Culture (A)  Final    SERRATIA MARCESCENS SUSCEPTIBILITIES TO FOLLOW Performed at [facility], [place] [address], [place], [place] [address]    Report Status PENDING  Incomplete  Blood Culture ID Panel (Reflexed)     Status: Abnormal   Collection Time: 04/14/20  9:37 AM  Result Value Ref Range Status   Enterococcus faecalis NOT DETECTED NOT  DETECTED Final   Enterococcus Faecium  NOT DETECTED NOT DETECTED Final   Listeria monocytogenes NOT DETECTED NOT DETECTED Final   Staphylococcus species NOT DETECTED NOT DETECTED Final   Staphylococcus aureus (BCID) NOT DETECTED NOT DETECTED Final   Staphylococcus epidermidis NOT DETECTED NOT DETECTED Final   Staphylococcus lugdunensis NOT DETECTED NOT DETECTED Final   Streptococcus species NOT DETECTED NOT DETECTED Final   Streptococcus agalactiae NOT DETECTED NOT DETECTED Final   Streptococcus pneumoniae NOT DETECTED NOT DETECTED Final   Streptococcus pyogenes NOT DETECTED NOT DETECTED Final   A.calcoaceticus-baumannii NOT DETECTED NOT DETECTED Final   Bacteroides fragilis NOT DETECTED NOT DETECTED Final   Enterobacterales DETECTED (A) NOT DETECTED Final    Comment: Enterobacterales represent a large order of gram negative bacteria, not a single organism. CRITICAL RESULT CALLED TO, READ BACK BY AND VERIFIED WITH: [place] ON  04/15/2020    Enterobacter cloacae complex NOT DETECTED NOT DETECTED Final   Escherichia coli NOT DETECTED NOT DETECTED Final   Klebsiella aerogenes NOT DETECTED NOT DETECTED Final   Klebsiella oxytoca NOT DETECTED NOT DETECTED Final   Klebsiella pneumoniae NOT DETECTED NOT DETECTED Final   Proteus species NOT DETECTED NOT DETECTED Final   Salmonella species NOT DETECTED NOT DETECTED Final   Serratia marcescens DETECTED (A) NOT DETECTED Final    Comment: CRITICAL RESULT CALLED TO, READ BACK BY AND VERIFIED WITH: PHARMD AMEND C. BY MESSAN H. AT 0320 ON  04/15/2020    Haemophilus influenzae NOT DETECTED NOT DETECTED Final   Neisseria meningitidis NOT DETECTED NOT DETECTED Final   Pseudomonas aeruginosa NOT DETECTED NOT DETECTED Final   Stenotrophomonas maltophilia NOT DETECTED NOT DETECTED Final   Candida albicans NOT DETECTED NOT DETECTED Final   Candida auris NOT DETECTED NOT DETECTED Final   Candida glabrata NOT DETECTED NOT DETECTED Final   Candida krusei NOT DETECTED  NOT DETECTED Final   Candida parapsilosis NOT DETECTED NOT DETECTED Final   Candida tropicalis NOT DETECTED NOT DETECTED Final   Cryptococcus neoformans/gattii NOT DETECTED NOT DETECTED Final   CTX-M ESBL NOT DETECTED NOT DETECTED Final   Carbapenem resistance IMP NOT DETECTED NOT DETECTED Final   Carbapenem resistance KPC NOT DETECTED NOT DETECTED Final   Carbapenem resistance NDM NOT DETECTED NOT DETECTED Final   Carbapenem resist OXA 48 LIKE NOT DETECTED NOT DETECTED Final   Carbapenem resistance VIM NOT DETECTED NOT DETECTED Final    Comment: Performed at [facility], [address], [place], [place] [address]  Culture, blood (routine x 2)     Status: None (Preliminary result)   Collection Time: 04/14/20  9:42 AM   Specimen: BLOOD RIGHT HAND  Result Value Ref Range Status   Specimen Description BLOOD RIGHT HAND  Final   Special Requests   Final    BOTTLES DRAWN AEROBIC AND ANAEROBIC Blood Culture results may not be optimal due to an inadequate volume of blood received in culture bottles   Culture  Setup Time   Final    CORRECTED RESULTS GRAM NEGATIVE RODS PREVIOUSLY REPORTED AS: GRAM NEGATIVE COCCI CORRECTED RESULTS CALLED TO: Justice Britain CHEN 0749 [id number] FCP    Culture   Final    NO GROWTH 2 DAYS Performed at [facility], [place] [address], [place], [place] [address]    Report Status PENDING  Incomplete         Radiology Studies: DG CHEST PORT 1 VIEW  Result Date: 04/14/2020 CLINICAL DATA:  Pneumonia EXAM: PORTABLE CHEST 1 VIEW COMPARISON:  04/09/2020 FINDINGS: Pulmonary vascular congestion. Interstitial prominence. Increased patchy left lung opacities. No significant pleural effusion. No pneumothorax. Stable cardiomegaly. PICC line remains present. IMPRESSION: Increased patchy left lung opacities superimposed on interstitial prominence and pulmonary vascular congestion. Asymmetric pulmonary edema and pneumonia are considerations. Electronically Signed   By:  Macy Mis M.D.   On: 04/14/2020 14:39        Scheduled Meds: . (feeding supplement) PROSource Plus  30 mL Oral TID BM  . apixaban  5 mg Oral BID  . atorvastatin  40 mg Oral Daily  . Chlorhexidine Gluconate Cloth  6 each Topical Daily  . insulin aspart  0-20 Units Subcutaneous TID WC  . insulin aspart  5 Units Subcutaneous TID WC  . insulin glargine  30 Units Subcutaneous BID  . metoCLOPramide  10 mg Oral TID AC & HS  . multivitamin with minerals  1 tablet Oral Daily  . sodium chloride flush  3 mL Intravenous Q12H  . spironolactone  12.5 mg Oral Daily   Continuous Infusions: . amiodarone 60 mg/hr (04/16/20 0653)  . ceFEPime (MAXIPIME) IV 2 g (04/16/20 0655)  . famotidine (PEPCID) IV 20 mg (04/15/20 1108)  . [START ON 04/23/2020] ferumoxytol    . milrinone 0.25 mcg/kg/min (04/16/20 0225)     LOS: 13 days       Hosie Poisson, MD [facility]   To contact the attending provider between 7A-7P or the covering provider during after hours 7P-7A, please log into the web site [email or web address] and access using universal [place] password for that web site. If you do not have the password, please call the hospital operator.  04/16/2020, 10:32 AM

## 2020-04-17 ENCOUNTER — Inpatient Hospital Stay (HOSPITAL_COMMUNITY): Payer: Medicare HMO

## 2020-04-17 DIAGNOSIS — R509 Fever, unspecified: Secondary | ICD-10-CM | POA: Diagnosis not present

## 2020-04-17 DIAGNOSIS — A4153 Sepsis due to Serratia: Secondary | ICD-10-CM | POA: Diagnosis not present

## 2020-04-17 DIAGNOSIS — I132 Hypertensive heart and chronic kidney disease with heart failure and with stage 5 chronic kidney disease, or end stage renal disease: Secondary | ICD-10-CM | POA: Diagnosis not present

## 2020-04-17 DIAGNOSIS — I5023 Acute on chronic systolic (congestive) heart failure: Secondary | ICD-10-CM | POA: Diagnosis not present

## 2020-04-17 DIAGNOSIS — J9622 Acute and chronic respiratory failure with hypercapnia: Secondary | ICD-10-CM | POA: Diagnosis not present

## 2020-04-17 DIAGNOSIS — Z515 Encounter for palliative care: Secondary | ICD-10-CM | POA: Diagnosis not present

## 2020-04-17 DIAGNOSIS — R0600 Dyspnea, unspecified: Secondary | ICD-10-CM | POA: Diagnosis not present

## 2020-04-17 DIAGNOSIS — J9601 Acute respiratory failure with hypoxia: Secondary | ICD-10-CM | POA: Diagnosis not present

## 2020-04-17 DIAGNOSIS — I5020 Unspecified systolic (congestive) heart failure: Secondary | ICD-10-CM | POA: Diagnosis not present

## 2020-04-17 DIAGNOSIS — E8779 Other fluid overload: Secondary | ICD-10-CM | POA: Diagnosis not present

## 2020-04-17 DIAGNOSIS — A4150 Gram-negative sepsis, unspecified: Secondary | ICD-10-CM | POA: Diagnosis not present

## 2020-04-17 DIAGNOSIS — N179 Acute kidney failure, unspecified: Secondary | ICD-10-CM | POA: Diagnosis not present

## 2020-04-17 DIAGNOSIS — Z20822 Contact with and (suspected) exposure to covid-19: Secondary | ICD-10-CM | POA: Diagnosis not present

## 2020-04-17 DIAGNOSIS — I10 Essential (primary) hypertension: Secondary | ICD-10-CM | POA: Diagnosis not present

## 2020-04-17 DIAGNOSIS — I5043 Acute on chronic combined systolic (congestive) and diastolic (congestive) heart failure: Secondary | ICD-10-CM | POA: Diagnosis not present

## 2020-04-17 DIAGNOSIS — R7881 Bacteremia: Secondary | ICD-10-CM | POA: Diagnosis not present

## 2020-04-17 DIAGNOSIS — E871 Hypo-osmolality and hyponatremia: Secondary | ICD-10-CM | POA: Diagnosis not present

## 2020-04-17 DIAGNOSIS — I4819 Other persistent atrial fibrillation: Secondary | ICD-10-CM | POA: Diagnosis not present

## 2020-04-17 DIAGNOSIS — B9689 Other specified bacterial agents as the cause of diseases classified elsewhere: Secondary | ICD-10-CM | POA: Diagnosis not present

## 2020-04-17 LAB — URINALYSIS, ROUTINE W REFLEX MICROSCOPIC
Bilirubin Urine: NEGATIVE
Glucose, UA: NEGATIVE mg/dL
Ketones, ur: 5 mg/dL — AB
Nitrite: NEGATIVE
Protein, ur: NEGATIVE mg/dL
Specific Gravity, Urine: 1.02 (ref 1.005–1.030)
pH: 5 (ref 5.0–8.0)

## 2020-04-17 LAB — CBC
HCT: 25.3 % — ABNORMAL LOW (ref 39.0–52.0)
HCT: 26.6 % — ABNORMAL LOW (ref 39.0–52.0)
Hemoglobin: 7.7 g/dL — ABNORMAL LOW (ref 13.0–17.0)
Hemoglobin: 8.2 g/dL — ABNORMAL LOW (ref 13.0–17.0)
MCH: 24.2 pg — ABNORMAL LOW (ref 26.0–34.0)
MCH: 24 pg — ABNORMAL LOW (ref 26.0–34.0)
MCHC: 30.4 g/dL (ref 30.0–36.0)
MCHC: 30.8 g/dL (ref 30.0–36.0)
MCV: 79.6 fL — ABNORMAL LOW (ref 80.0–100.0)
MCV: 78 fL — ABNORMAL LOW (ref 80.0–100.0)
Platelets: 330 10*3/uL (ref 150–400)
Platelets: 419 10*3/uL — ABNORMAL HIGH (ref 150–400)
RBC: 3.18 MIL/uL — ABNORMAL LOW (ref 4.22–5.81)
RBC: 3.41 MIL/uL — ABNORMAL LOW (ref 4.22–5.81)
RDW: 25.7 % — ABNORMAL HIGH (ref 11.5–15.5)
RDW: 25.9 % — ABNORMAL HIGH (ref 11.5–15.5)
WBC: 11 10*3/uL — ABNORMAL HIGH (ref 4.0–10.5)
WBC: 13.3 10*3/uL — ABNORMAL HIGH (ref 4.0–10.5)
nRBC: 0.4 % — ABNORMAL HIGH (ref 0.0–0.2)
nRBC: 0.3 % — ABNORMAL HIGH (ref 0.0–0.2)

## 2020-04-17 LAB — BASIC METABOLIC PANEL
Anion gap: 12 (ref 5–15)
Anion gap: 14 (ref 5–15)
BUN: 74 mg/dL — ABNORMAL HIGH (ref 8–23)
BUN: 82 mg/dL — ABNORMAL HIGH (ref 8–23)
CO2: 27 mmol/L (ref 22–32)
CO2: 26 mmol/L (ref 22–32)
Calcium: 8.1 mg/dL — ABNORMAL LOW (ref 8.9–10.3)
Calcium: 8.4 mg/dL — ABNORMAL LOW (ref 8.9–10.3)
Chloride: 87 mmol/L — ABNORMAL LOW (ref 98–111)
Chloride: 88 mmol/L — ABNORMAL LOW (ref 98–111)
Creatinine, Ser: 3.22 mg/dL — ABNORMAL HIGH (ref 0.61–1.24)
Creatinine, Ser: 4.02 mg/dL — ABNORMAL HIGH (ref 0.61–1.24)
GFR, Estimated: 20 mL/min — ABNORMAL LOW (ref >60)
GFR, Estimated: 15 mL/min — ABNORMAL LOW (ref >60)
Glucose, Bld: 327 mg/dL — ABNORMAL HIGH (ref 70–99)
Glucose, Bld: 188 mg/dL — ABNORMAL HIGH (ref 70–99)
Potassium: 4.3 mmol/L (ref 3.5–5.1)
Potassium: 4.5 mmol/L (ref 3.5–5.1)
Sodium: 126 mmol/L — ABNORMAL LOW (ref 135–145)
Sodium: 128 mmol/L — ABNORMAL LOW (ref 135–145)

## 2020-04-17 LAB — COOXEMETRY PANEL
Carboxyhemoglobin: 1.3 % (ref 0.5–1.5)
Carboxyhemoglobin: 1.4 % (ref 0.5–1.5)
Methemoglobin: 1 % (ref 0.0–1.5)
Methemoglobin: 0.9 % (ref 0.0–1.5)
O2 Saturation: 41.4 %
O2 Saturation: 40.8 %
Total hemoglobin: 9.1 g/dL — ABNORMAL LOW (ref 12.0–16.0)
Total hemoglobin: 7.2 g/dL — ABNORMAL LOW (ref 12.0–16.0)

## 2020-04-17 LAB — CULTURE, BLOOD (ROUTINE X 2)

## 2020-04-17 LAB — GLUCOSE, CAPILLARY
Glucose-Capillary: 189 mg/dL — ABNORMAL HIGH (ref 70–99)
Glucose-Capillary: 262 mg/dL — ABNORMAL HIGH (ref 70–99)
Glucose-Capillary: 175 mg/dL — ABNORMAL HIGH (ref 70–99)
Glucose-Capillary: 246 mg/dL — ABNORMAL HIGH (ref 70–99)

## 2020-04-17 MED ORDER — FUROSEMIDE 10 MG/ML IJ SOLN
80.0000 mg | Freq: Two times a day (BID) | INTRAMUSCULAR | Status: DC
  Administered 2020-04-17 – 2020-04-18 (×3): 80 mg via INTRAVENOUS
  Filled 2020-04-17 (×3): qty 8

## 2020-04-17 MED ORDER — INSULIN GLARGINE 100 UNIT/ML ~~LOC~~ SOLN
42.0000 [IU] | Freq: Two times a day (BID) | SUBCUTANEOUS | Status: DC
  Administered 2020-04-17 – 2020-04-18 (×2): 42 [IU] via SUBCUTANEOUS
  Filled 2020-04-17 (×3): qty 0.42

## 2020-04-17 MED ORDER — CEFEPIME HCL 2 G IJ SOLR
2.0000 g | INTRAMUSCULAR | Status: DC
  Administered 2020-04-18: 2 g via INTRAVENOUS
  Filled 2020-04-17: qty 2

## 2020-04-17 MED ORDER — AMIODARONE LOAD VIA INFUSION
150.0000 mg | Freq: Once | INTRAVENOUS | Status: AC
  Administered 2020-04-17: 150 mg via INTRAVENOUS
  Filled 2020-04-17: qty 83.34

## 2020-04-17 MED ORDER — SODIUM CHLORIDE 0.9 % IV SOLN
INTRAVENOUS | Status: DC | PRN
  Administered 2020-04-17: 06:00:00 250 mL via INTRAVENOUS

## 2020-04-17 MED ORDER — LOPERAMIDE HCL 2 MG PO CAPS
2.0000 mg | ORAL_CAPSULE | ORAL | Status: DC | PRN
  Administered 2020-04-17 (×2): 2 mg via ORAL
  Filled 2020-04-17 (×2): qty 1

## 2020-04-17 MED ORDER — MILRINONE LACTATE IN DEXTROSE 20-5 MG/100ML-% IV SOLN
0.3750 ug/kg/min | INTRAVENOUS | Status: DC
  Administered 2020-04-17 – 2020-04-18 (×4): 0.375 ug/kg/min via INTRAVENOUS
  Filled 2020-04-17 (×3): qty 100

## 2020-04-17 NOTE — Progress Notes (Signed)
   04/17/20 0902  Height and Weight  Weight (!) 137.1 kg  Type of Scale Used Standing  Type of Weight Actual  BMI (Calculated) 47.32   Standing weight retaken.

## 2020-04-17 NOTE — Progress Notes (Signed)
Pt had a bowel movement without urine present. Nurse witness.   Pt stated not need to use the urinal at this time.

## 2020-04-17 NOTE — Progress Notes (Signed)
   04/17/20 1110  Urine Characteristics  Urinary Interventions Bladder scan  Bladder Scan Volume (mL) 122 mL    No urine output at this time. Will continue to monitor.

## 2020-04-17 NOTE — Progress Notes (Signed)
   04/17/20 1723  Unmeasured Output  Stool Occurrence 3  Stool Characteristics  Bowel Incontinence Yes  Stool Type Type 6 (Mushy consistency with ragged edges)  Has the patient had three Type 7 stools in the last 24 hours? No  Stool Descriptors Green;Brown (dark green)  Stool Source Rectum

## 2020-04-17 NOTE — Progress Notes (Signed)
Nutrition Follow-up  DOCUMENTATION CODES:   Morbid obesity  INTERVENTION:   -Continue MVI with minerals daily -Continue 30 ml Prosource Plus TID, each supplement provides 100 kcals and 15 grams protein -Continue Magic cup TID with meals, each supplement provides 290 kcal and 9 grams of protein  NUTRITION DIAGNOSIS:   Increased nutrient needs related to wound healing as evidenced by estimated needs.  Ongoing  GOAL:   Patient will meet greater than or equal to 90% of their needs  Progressing  MONITOR:   PO intake,Supplement acceptance,Labs,Weight trends,Skin,I & O's  REASON FOR ASSESSMENT:   LOS    ASSESSMENT:   Raymond Pratt is a 70 y.o. male with medical history significant of combined systolic and diastolic heart failure, atrial fibrillation, anemia, cellulitis, GI bleed, hypertension, OSA on CPAP, diabetes, nonobstructive CAD who presents with worsening shortness of breath.  Reviewed I/O's: +1.7 L x 24 hours and -13.1 l since admission  UOP: 500 ml x 24 hours  Case discussed with RN, who reports pt with good appetite and is taking Prosource. She confirms presence of wounds and reports daily dressing changes with night shift. Pt has been given lasix, but unable to void yet. A bladder scan has also been done  Pt with very flat affect at time of visit. He express frustration with being in the hospital for so long and continued fluid overload. Pt shares that his UBW is around 280# and was about 80# over his body weight at admission secondary to fluid overload.   He reports improving appetite. He shares that it had been difficulty to eat for 3-4 days during admission secondary to possible pneumonia. He also reports that he damaged his rt shoulder and hand when he fell PTA and has some difficulty feeding himself, but this has improved. Noted meal completion 100%. Pt shares that PTA he would either snack during the day or consume about 2 meals per day (meat, starch,  and vegetable).  Discussed importance of good meal and supplement intake to promote healing. Visit cut short due to chaplain visit.  Medications reviewed and include lasix and reglan.   Labs reviewed: Na: 126, CBGS: 189-262 (inpatient orders for glycemic control are 0-20 units insulin aspart TID with meals, 8 units insulin aspart TID with meals, and 42 units insulin glargine BID).   NUTRITION - FOCUSED PHYSICAL EXAM:  Flowsheet Row Most Recent Value  Orbital Region No depletion  Upper Arm Region No depletion  Thoracic and Lumbar Region No depletion  Buccal Region No depletion  Temple Region No depletion  Clavicle Bone Region No depletion  Clavicle and Acromion Bone Region No depletion  Scapular Bone Region No depletion  Dorsal Hand No depletion  Patellar Region No depletion  Anterior Thigh Region No depletion  Posterior Calf Region No depletion  Edema (RD Assessment) Mild  Hair Reviewed  Eyes Reviewed  Mouth Reviewed  Skin Reviewed  Nails Reviewed       Diet Order:   Diet Order            Diet heart healthy/carb modified Room service appropriate? Yes; Fluid consistency: Thin; Fluid restriction: 1500 mL Fluid  Diet effective now                 EDUCATION NEEDS:   No education needs have been identified at this time  Skin:  Skin Assessment: Skin Integrity Issues: Skin Integrity Issues:: Stage II,Other (Comment),Stage III Stage II: rt buttocks Stage III: lt heel Other: non pressure wound lt  leg  Last BM:  04/17/20  Height:   Ht Readings from Last 1 Encounters:  04/04/20 '5\' 7"'$  (1.702 m)    Weight:   Wt Readings from Last 1 Encounters:  04/17/20 (!) 137.1 kg    Ideal Body Weight:  67.3 kg  BMI:  Body mass index is 47.33 kg/m.  Estimated Nutritional Needs:   Kcal:  2150-2350  Protein:  130-145 grams  Fluid:  1.5 L    Loistine Chance, RD, LDN, [place] Registered Dietitian II Certified Diabetes Care and Education Specialist Please refer to [facility] for  RD and/or RD on-call/weekend/after hours pager

## 2020-04-17 NOTE — Progress Notes (Signed)
Pt has not used the urinal at this time, this morning pt had a large watery stool.  Pt remains in a 1500 ml fluid restriction. Education given to pt regarding strict intake and output. Pt stated he knows since he is been in the hospital for a long time. Pt stated "I will go to the bathroom when I need to go to the bathroom"  CVP taken at 0742 :16 ;while pt in bed.   Emotional support given to pt.  Pt refused.   Will continue to monitor.

## 2020-04-17 NOTE — Progress Notes (Signed)
Spiritual consult orders placed by RN.

## 2020-04-17 NOTE — Progress Notes (Signed)
[facility] for Infectious [facility]   Reason for visit: Follow up on Serratia bacteremia  Interval History: second blood culture with Serratia as well.  WBC stable at 11.  No fever.  Cefepime day 4   Physical Exam: Constitutional:  Vitals:   04/17/20 1118 04/17/20 1232  BP: 105/82   Pulse: (!) 115 (!) 108  Resp: 20 18  Temp: 97.6 F (36.4 C)   SpO2:    he is in nad Cardiovascular: RRR SKin: no rash  Review of Systems: Constitutional: negative for fever or chills Gastrointestinal: negative fordiarrhea  Lab Results  Component Value Date   WBC 11.0 (H) 04/17/2020   HGB 7.7 (L) 04/17/2020   HCT 25.3 (L) 04/17/2020   MCV 79.6 (L) 04/17/2020   PLT 330 04/17/2020    Lab Results  Component Value Date   CREATININE 3.22 (H) 04/17/2020   BUN 74 (H) 04/17/2020   NA 126 (L) 04/17/2020   K 4.3 04/17/2020   CL 87 (L) 04/17/2020   CO2 27 04/17/2020    Lab Results  Component Value Date   ALT 22 04/15/2020   AST 28 04/15/2020   ALKPHOS 71 04/15/2020     Microbiology: Recent Results (from the past 240 hour(s))  Culture, blood (routine x 2)     Status: Abnormal   Collection Time: 04/14/20  9:37 AM   Specimen: BLOOD  Result Value Ref Range Status   Specimen Description BLOOD RIGHT ANTECUBITAL  Final   Special Requests   Final    BOTTLES DRAWN AEROBIC AND ANAEROBIC Blood Culture results may not be optimal due to an inadequate volume of blood received in culture bottles Performed at [facility], [address] [place], [place] [address]    Culture  Setup Time   Final    CORRECTED RESULTS GRAM NEGATIVE RODS PREVIOUSLY REPORTED AS: GRAM NEGATIVE COCCI CORRECTED RESULTS CALLED TO: [place] [id number] FCP    Culture SERRATIA MARCESCENS (A)  Final   Report Status 04/17/2020 FINAL  Final   Organism ID, Bacteria SERRATIA MARCESCENS  Final      Susceptibility   Serratia marcescens - MIC*    CEFAZOLIN >=64 RESISTANT Resistant     CEFEPIME <=0.12  SENSITIVE Sensitive     CEFTAZIDIME <=1 SENSITIVE Sensitive     CEFTRIAXONE <=0.25 SENSITIVE Sensitive     CIPROFLOXACIN <=0.25 SENSITIVE Sensitive     GENTAMICIN <=1 SENSITIVE Sensitive     TRIMETH/SULFA <=20 SENSITIVE Sensitive     * SERRATIA MARCESCENS  Blood Culture ID Panel (Reflexed)     Status: Abnormal   Collection Time: 04/14/20  9:37 AM  Result Value Ref Range Status   Enterococcus faecalis NOT DETECTED NOT DETECTED Final   Enterococcus Faecium NOT DETECTED NOT DETECTED Final   Listeria monocytogenes NOT DETECTED NOT DETECTED Final   Staphylococcus species NOT DETECTED NOT DETECTED Final   Staphylococcus aureus (BCID) NOT DETECTED NOT DETECTED Final   Staphylococcus epidermidis NOT DETECTED NOT DETECTED Final   Staphylococcus lugdunensis NOT DETECTED NOT DETECTED Final   Streptococcus species NOT DETECTED NOT DETECTED Final   Streptococcus agalactiae NOT DETECTED NOT DETECTED Final   Streptococcus pneumoniae NOT DETECTED NOT DETECTED Final   Streptococcus pyogenes NOT DETECTED NOT DETECTED Final   A.calcoaceticus-baumannii NOT DETECTED NOT DETECTED Final   Bacteroides fragilis NOT DETECTED NOT DETECTED Final   Enterobacterales DETECTED (A) NOT DETECTED Final    Comment: Enterobacterales represent a large order of gram negative bacteria, not  a single organism. CRITICAL RESULT CALLED TO, READ BACK BY AND VERIFIED WITH: [place] ON  04/15/2020    Enterobacter cloacae complex NOT DETECTED NOT DETECTED Final   Escherichia coli NOT DETECTED NOT DETECTED Final   Klebsiella aerogenes NOT DETECTED NOT DETECTED Final   Klebsiella oxytoca NOT DETECTED NOT DETECTED Final   Klebsiella pneumoniae NOT DETECTED NOT DETECTED Final   Proteus species NOT DETECTED NOT DETECTED Final   Salmonella species NOT DETECTED NOT DETECTED Final   Serratia marcescens DETECTED (A) NOT DETECTED Final    Comment: CRITICAL RESULT CALLED TO, READ BACK BY AND VERIFIED WITH: PHARMD  AMEND C. BY MESSAN H. AT 0320 ON  04/15/2020    Haemophilus influenzae NOT DETECTED NOT DETECTED Final   Neisseria meningitidis NOT DETECTED NOT DETECTED Final   Pseudomonas aeruginosa NOT DETECTED NOT DETECTED Final   Stenotrophomonas maltophilia NOT DETECTED NOT DETECTED Final   Candida albicans NOT DETECTED NOT DETECTED Final   Candida auris NOT DETECTED NOT DETECTED Final   Candida glabrata NOT DETECTED NOT DETECTED Final   Candida krusei NOT DETECTED NOT DETECTED Final   Candida parapsilosis NOT DETECTED NOT DETECTED Final   Candida tropicalis NOT DETECTED NOT DETECTED Final   Cryptococcus neoformans/gattii NOT DETECTED NOT DETECTED Final   CTX-M ESBL NOT DETECTED NOT DETECTED Final   Carbapenem resistance IMP NOT DETECTED NOT DETECTED Final   Carbapenem resistance KPC NOT DETECTED NOT DETECTED Final   Carbapenem resistance NDM NOT DETECTED NOT DETECTED Final   Carbapenem resist OXA 48 LIKE NOT DETECTED NOT DETECTED Final   Carbapenem resistance VIM NOT DETECTED NOT DETECTED Final    Comment: Performed at [facility], [address], [place], [place] [address]  Culture, blood (routine x 2)     Status: Abnormal   Collection Time: 04/14/20  9:42 AM   Specimen: BLOOD RIGHT HAND  Result Value Ref Range Status   Specimen Description BLOOD RIGHT HAND  Final   Special Requests   Final    BOTTLES DRAWN AEROBIC AND ANAEROBIC Blood Culture results may not be optimal due to an inadequate volume of blood received in culture bottles   Culture  Setup Time   Final    CORRECTED RESULTS GRAM NEGATIVE RODS PREVIOUSLY REPORTED AS: GRAM NEGATIVE COCCI CORRECTED RESULTS CALLED TO: PHARMD LYDIA CHEN [id number] FCP    Culture (A)  Final    SERRATIA MARCESCENS SUSCEPTIBILITIES PERFORMED ON PREVIOUS CULTURE WITHIN THE LAST 5 DAYS. Performed at [facility], [place] [address] [place], [place] [address]    Report Status 04/17/2020 FINAL  Final    Impression/Plan:  1. Serratia bacteremia  - doing well and tolerating cefepime.  On day 4 of 7 and will continue.    2.  Fever - resolving, now has been afebrile 48 hours.

## 2020-04-17 NOTE — Progress Notes (Signed)
PHARMACY NOTE:  ANTIMICROBIAL RENAL DOSAGE ADJUSTMENT  Current antimicrobial regimen includes a mismatch between antimicrobial dosage and estimated renal function.  As per policy approved by the Pharmacy & Therapeutics and Medical Executive Committees, the antimicrobial dosage will be adjusted accordingly.  Current antimicrobial dosage:  Cefepime 2 gm IV Q 12 hrs  Indication:  Bacteremia  Renal Function:  Estimated Creatinine Clearance: 23.5 mL/min (A) (by C-G formula based on SCr of 4.02 mg/dL (H)). '[]'$      On intermittent HD, scheduled: '[]'$      On CRRT    Antimicrobial dosage has been changed to:  Cefepime 2 gm IV Q 24 hrs  Thank you for allowing pharmacy to be a part of this patient's care.  Gillermina Hu, PharmD, BCPS, [facility] Clinical Pharmacist 04/17/2020 8:35 PM
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
## 2020-04-17 NOTE — Progress Notes (Signed)
   04/17/20 1455  Urethral Catheter Gwendloyn Forsee RN 16 Fr.  Placement Date/Time: 04/17/20 1443   Perineal care performed prior to insertion?: Yes  Person Inserting Catheter: [place] with Catheter Insertion: Beverlee Nims RN  Patient Location at Time of Insertion [facility], Department, Room Number): 3E...  Output (mL) 100 mL

## 2020-04-17 NOTE — Progress Notes (Signed)
Occupational Therapy Treatment Patient Details Name: Raymond Pratt MRN: [id number] DOB: [birth year] Today's Date: 04/17/2020    History of present illness Raymond Pratt is a 70 y.o. male with medical history significant of combined systolic and diastolic heart failure, atrial fibrillation, anemia, cellulitis, GI bleed, hypertension, OSA on CPAP, diabetes, nonobstructive CAD who presents with worsening shortness of breath. Found to have acute exacerbation of CHF.   OT comments  Pt progressing towards acute OT goals. Completed functional transfers and household distance functional mobility with 2 standing rest breaks needed. Fatigues quickly and at times seems to utilize momentum during mobility. DOE 3/4, recovered in a timely manner once sitting EOB. On 3L O2 throughout session. D/c plan remains appropriate.    Follow Up Recommendations  Home health OT;Supervision - Intermittent    Equipment Recommendations  None recommended by OT    Recommendations for Other Services      Precautions / Restrictions Precautions Precautions: Fall;Other (comment) Precaution Comments: watch HR, wound on left heel and front of ankle Restrictions Weight Bearing Restrictions: No       Mobility Bed Mobility               General bed mobility comments: on BSC at start of session, EOB at end of session  Transfers Overall transfer level: Needs assistance Equipment used: Rolling walker (2 wheeled) Transfers: Sit to/from Stand Sit to Stand: Min guard         General transfer comment: min guard for safety. uses momentum. to/from BSC and EOB, elevated seat surfaces    Balance Overall balance assessment: Needs assistance Sitting-balance support: Feet supported;No upper extremity supported Sitting balance-Leahy Scale: Good Sitting balance - Comments: sitting EOB no UE support and no LOB   Standing balance support: Bilateral upper extremity supported;During functional  activity Standing balance-Leahy Scale: Poor Standing balance comment: reliant on external support                           ADL either performed or assessed with clinical judgement   ADL Overall ADL's : Needs assistance/impaired                         Toilet Transfer: Min guard;RW;Ambulation           Functional mobility during ADLs: Min guard;Rolling walker General ADL Comments: Completed household distance functional mobility with 2 standing rest breaks leaning onto rw. Fatigues quickly.     Vision       Perception     Praxis      Cognition Arousal/Alertness: Awake/alert Behavior During Therapy: WFL for tasks assessed/performed Overall Cognitive Status: Within Functional Limits for tasks assessed                                 General Comments: [facility]        Exercises     Shoulder Instructions       General Comments on 3L O2 throughout session. DOE 3/4.    Pertinent Vitals/ Pain       Pain Assessment: Faces Faces Pain Scale: Hurts little more Pain Location: chronic back and left neck Pain Descriptors / Indicators: Aching Pain Intervention(s): Monitored during session;Repositioned  Home Living  Prior Functioning/Environment              Frequency  Min 2X/week        Progress Toward Goals  OT Goals(current goals can now be found in the care plan section)  Progress towards OT goals: Progressing toward goals  Acute Rehab OT Goals Patient Stated Goal: feel better OT Goal Formulation: With patient Time For Goal Achievement: 04/24/2020 Potential to Achieve Goals: Good ADL Goals Pt Will Perform Grooming: with modified independence;sitting;standing Pt Will Perform Lower Body Bathing: with modified independence;sit to/from stand;with adaptive equipment Pt Will Perform Lower Body Dressing: with modified independence;with adaptive equipment;sit to/from  stand Pt Will Transfer to Toilet: with modified independence;ambulating;regular height toilet;grab bars Pt Will Perform Toileting - Clothing Manipulation and hygiene: with modified independence;sit to/from stand  Plan Discharge plan remains appropriate;Frequency remains appropriate    Co-evaluation                 AM-PAC OT "6 Clicks" Daily Activity     Outcome Measure   Help from another person eating meals?: None Help from another person taking care of personal grooming?: A Little Help from another person toileting, which includes using toliet, bedpan, or urinal?: A Little Help from another person bathing (including washing, rinsing, drying)?: A Lot Help from another person to put on and taking off regular upper body clothing?: A Little Help from another person to put on and taking off regular lower body clothing?: A Lot 6 Click Score: 17    End of Session Equipment Utilized During Treatment: Rolling walker;Oxygen;Other (comment) (3L)  OT Visit Diagnosis: Unsteadiness on feet (R26.81);Muscle weakness (generalized) (M62.81);Pain   Activity Tolerance Patient limited by fatigue;Patient tolerated treatment well (DOE 3/4)   Patient Left in bed;with call bell/phone within reach (sitting EOB)   Nurse Communication          [id number] OT Time Calculation (min): 45 min  Charges: OT General Charges $OT Visit: 1 Visit OT Treatments $Self Care/Home Management : 38-52 mins  Tyrone Schimke, OT Acute Rehabilitation Services Pager: [phone] Office: [place], [place] 04/17/2020, 1:56 PM

## 2020-04-17 NOTE — Progress Notes (Signed)
   04/17/20 1200  Clinical Encounter Type  Visited With Patient  Visit Type Spiritual support  Referral From Nurse  Consult/Referral To Nurse  Spiritual Encounters  Spiritual Needs Prayer  The chaplain visited with the patient. The patient talked about his desire to get well physically and go home. The patient has strong faith and is a [employer]. 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 The patient asked for prayers of strength. The chaplain prayed for physical, emotional, and mental strength.

## 2020-04-17 NOTE — Progress Notes (Addendum)
Advanced Heart Failure Rounding Note   Subjective:    2/7 febrile. Blood Cx obtained. Started antibiotics.   Bld Cx 1/2 Serratia Marcescens + enterobacterales. ID following. On cefepime. PCT 1.63 AF. WBC 8.3>>11.     On milrinone 0.25 mcg. Co-ox low at 41%. SCr continues to trend up, 2.09>>2.63>>3.22. I/Os not accurate, incomplete urine collection. Multiple urine occurences w/ frequent diarrhea.  Thinks related to abx.   Volume status trending up off diuretics. Wt back up ?15lb. CVP ~20  K 4.3  Na 126 Hgb 8.3>> 7.7 ? Dilutional   Remains in Afib, 120s.   Objective:   Weight Range:  Vital Signs:   Temp:  [97.5 F (36.4 C)-98.7 F (37.1 C)] 97.5 F (36.4 C) (02/10 0743) Pulse Rate:  [53-126] 120 (02/10 0743) Resp:  [20-24] 20 (02/10 0743) BP: (113-125)/(68-93) 124/73 (02/10 0743) SpO2:  [90 %-99 %] 99 % (02/10 0743) Weight:  [135.7 kg-136.2 kg] 135.7 kg (02/10 0358) Last BM Date: 04/17/20  Weight change: Filed Weights   04/16/20 0227 04/17/20 0132 04/17/20 0358  Weight: 129.6 kg (!) 136.2 kg 135.7 kg    Intake/Output:   Intake/Output Summary (Last 24 hours) at 04/17/2020 0836 Last data filed at 04/17/2020 0745 Gross per 24 hour  Intake 2303.93 ml  Output 501 ml  Net 1802.93 ml    PHYSICAL EXAM: CVP 20  General:  Chronically ill appearing. Fatigued looking. No distress  HEENT: normal Neck: supple. JVD elevated to ear Carotids 2+ bilat; no bruits. No lymphadenopathy or thryomegaly appreciated. Cor: PMI nondisplaced. Irregular rate & rhythm, tachy. No rubs, gallops or murmurs. Lungs: decreased BS at the bases bilaterally. No wheezing  Abdomen: obese soft, nontender, nondistended. No hepatosplenomegaly. No bruits or masses. Good bowel sounds. Extremities: no cyanosis, clubbing, rash, 1+ bilateral LEE edema bilateral chronic venous stasis dermatitis  Neuro: alert & orientedx3, cranial nerves grossly intact. moves all 4 extremities w/o difficulty. Affect  pleasant   Telemetry:  A fib 120-130s   Labs: Basic Metabolic Panel: Recent Labs  Lab 04/11/20 0500 04/12/20 0550 04/13/20 0623 04/14/20 0443 04/15/20 0429 04/15/20 0929 04/16/20 0500 04/16/20 1136 04/17/20 0413  NA 137   < > 132* 130* 129* 127*  --  129* 126*  K 3.8   < > 4.2 4.4 4.8 4.7  --  4.6 4.3  CL 94*   < > 88* 86* 89* 86*  --  89* 87*  CO2 32   < > '31 31 29 28  '$ --  27 27  GLUCOSE 169*   < > 294* 333* 215* 412*  --  326*  323* 327*  BUN 32*   < > 37* 41* 46* 47*  --  65* 74*  CREATININE 1.67*   < > 1.86* 1.68* 2.07* 2.09*  --  2.63* 3.22*  CALCIUM 8.8*   < > 8.4* 8.5* 8.4* 7.9*  --  8.1* 8.1*  MG 2.5*  --  2.3  --  2.4  --  2.6*  --   --    < > = values in this interval not displayed.    Liver Function Tests: Recent Labs  Lab 04/14/20 0443 04/15/20 0429  AST 22 28  ALT 18 22  ALKPHOS 61 71  BILITOT 0.8 1.0  PROT 6.9 6.4*  ALBUMIN 2.8* 2.7*   No results for input(s): LIPASE, AMYLASE in the last 168 hours. No results for input(s): AMMONIA in the last 168 hours.  CBC: Recent Labs  Lab 04/13/20  [id number] 04/14/20 1433 04/15/20 0429 04/16/20 0500 04/17/20 0413  WBC 7.9 8.3 8.4 8.3 11.0*  NEUTROABS  --  6.5  --   --   --   HGB 9.5* 10.0* 8.5* 8.3* 7.7*  HCT 33.4* 34.7* 28.1* 28.9* 25.3*  MCV 82.1 82.4 80.1 81.0 79.6*  PLT 389 399 341 331 330    Cardiac Enzymes: No results for input(s): CKTOTAL, CKMB, CKMBINDEX, TROPONINI in the last 168 hours.  BNP: BNP (last 3 results) Recent Labs    12/10/19 0057 02/13/20 0807 04/02/2020 2019  BNP 154.7* 111.6* 195.0*    ProBNP (last 3 results) No results for input(s): PROBNP in the last 8760 hours.    Other results:  Imaging: No results found.   Medications:     Scheduled Medications: . (feeding supplement) PROSource Plus  30 mL Oral TID BM  . apixaban  5 mg Oral BID  . atorvastatin  40 mg Oral Daily  . Chlorhexidine Gluconate Cloth  6 each Topical Daily  . famotidine  20 mg Oral Daily  .  insulin aspart  0-20 Units Subcutaneous TID WC  . insulin aspart  8 Units Subcutaneous TID WC  . insulin glargine  38 Units Subcutaneous BID  . metoCLOPramide  10 mg Oral TID AC & HS  . multivitamin with minerals  1 tablet Oral Daily  . sodium chloride flush  3 mL Intravenous Q12H    Infusions: . sodium chloride 250 mL (04/17/20 0628)  . amiodarone 60 mg/hr (04/17/20 0450)  . ceFEPime (MAXIPIME) IV 2 g (04/17/20 0630)  . [START ON 04/23/2020] ferumoxytol    . milrinone 0.25 mcg/kg/min (04/17/20 0450)    PRN Medications: sodium chloride, acetaminophen **OR** acetaminophen, alum & mag hydroxide-simeth, [COMPLETED] diphenhydrAMINE **FOLLOWED BY** diphenhydrAMINE, hydrocortisone cream, hydrOXYzine, Muscle Rub, ondansetron (ZOFRAN) IV, polyethylene glycol, sodium chloride flush   Assessment/Plan:   1.  Acute on chronic systolic heart failure - Echo (11/2018) EF 25-30%, trace MR, trivial TR, moderately reduced RSVF.   - Cath (12/2018) RA 10, RV 55/8, PA 54/15 (27), PCW = 16, Fick CO/CI = 7.0/2.8 - Echo (04/04/2020) EF 45-50%, mild LVH, hypokinesis LV, dilated LA, dilated RV/RA, mod MR, mod-severe TR, RVSP > 60 mmHg.  - Milrinone started on 1/29 for low CO-OX.  - Todays CO-OX 41% on milrinone 0.25 mcg.  SCr rising, 3.22 today  - Increase milrinone to 0.375 and repeat Co-ox this afternoon.  - Wt up 15 lb and CVP ~20. Restart IV Lasix 80 mg bid. Repeat BMP this PM  - Likely triggered by AF, monitor closely while on inotropes. May need to rebolus  amio after milrinone increase - Holding [place] and dig for now.  - Hold spiro for now given AKI.    2. Persistent AF - has been on amio at home - suspect has been in AF for several weeks. ECG in 12/21 was NSR - Last week he went back in NSR but over the weekend back in A fib RVR.  - Will need cardioversion, once infection clears.  - Continue amio to 60 mg per hour.  Monitor on tele and rebolus if needed  - TFTs not too bad - continue  Eliquis  3. Gram-negative sepsis -2/7 Fever. Lactic Acid 2.2>1.2  - WBC 8.3>>11  - PCT 1.63 -2/7Bld Cx 1/2-Serratia Marcescens + enterobacterales - Antibiotics switched to cefepime . ID following  - UA negative   4. Acute on chronic hypoxic respiratory failure/OSA - Continue with CPAP at night for OSA -  He has been refusing CPAP.   5. AKI on CKD, Stage 3b - Baseline Cr 1.5-1.7 - SCr 1.7 >2.1>>2.6>>3.22 - Continue milrinone support. Increase to 0.375 given low co-ox  - Restart IV Lasix today, 80 mg bid - Off Spiro - Follow BMP. Repeat this afternoon      6. Venous stasis wounds L tib/fib, L heel & Sacral wound - Wound care consulted. Primary team managing   7. T2DM - A1c 12.4% recent hospitalization - SSI Per primary team - Would be concerned about SGT2i with mild erythema under pannus  8. Morbidly obese Body mass index is 46.85 kg/m.  9. Lung nodule - Will need outpatient imaging in 3 months, 9 mm CT (03/2020)  10. Hypokalemia/ Hypomagnesemia -K /Mag stable.  - Keep K> 4.0 Mg > 2.0   11. Hypervolemic Hyponatremia - Sodium 126. Restrict free water.   - restart diuretics   12. Anemia/Hx of GIB - Hgb down 8.3>>7.7 but ? Dilutional. No obvious source of bleeding  - EGD 12/2019 nonbleeding duodenal ulcer with gastritis - Received Feraheme 04/09/20  - monitor closely, defer transfusion to IM     Length of Stay: [place] PA-C  04/17/2020, 8:36 AM  Advanced Heart Failure Team Pager [phone] (M-F; 7a - 4p)  Please contact [facility] for night-coverage after hours (4p -7a ) and weekends on [email or web address]  Patient seen and examined with the above-signed Advanced Practice Provider and/or Housestaff. I personally reviewed laboratory data, imaging studies and relevant notes. I independently examined the patient and formulated the important aspects of the plan. I have edited the note to reflect any of my changes or salient points. I have personally  discussed the plan with the patient and/or family.  He continues to deteriorate. Co-ox 42% on milrinone 0.25. Volume status worsening. On cefepime for serratia bacteremia. Creatinine up to 3.2  General:  Chronically ill appearing. No resp difficulty HEENT: normal Neck: supple. JVP to earCarotids 2+ bilat; no bruits. No lymphadenopathy or thryomegaly appreciated. Cor: PMI nondisplaced. Irregular tachy No rubs, gallops or murmurs. Lungs: clear Abdomen: obese  soft, nontender, nondistended. No hepatosplenomegaly. No bruits or masses. Good bowel sounds. Extremities: no cyanosis, clubbing, rash, 2+ edema Neuro: alert & orientedx3, cranial nerves grossly intact. moves all 4 extremities w/o difficulty. Affect pleasant  Very tenuous now with worsening renal function in setting of ATN from Serratia bacteremia. Co-ox low on milrinone. CVP 20.   Agree with increasing milrinone and restarting lasix. Hopefully renal function will turn around soon otherwise will need to consider CVVHD vs Palliative Care. Not candidate for long-term HD.  CRITICAL CARE Performed by: [name]  Total critical care time: 35 minutes  Critical care time was exclusive of separately billable procedures and treating other patients.  Critical care was necessary to treat or prevent imminent or life-threatening deterioration.  Critical care was time spent personally by me (independent of midlevel providers or residents) on the following activities: development of treatment plan with patient and/or surrogate as well as nursing, discussions with consultants, evaluation of patient's response to treatment, examination of patient, obtaining history from patient or surrogate, ordering and performing treatments and interventions, ordering and review of laboratory studies, ordering and review of radiographic studies, pulse oximetry and re-evaluation of patient's condition.  [name], MD  4:41 PM

## 2020-04-17 NOTE — Progress Notes (Addendum)
Pt's granddaughter at bedside and requested for a bag to take pt's clothes home to wash them. Patient belongings bag given.

## 2020-04-17 NOTE — Progress Notes (Addendum)
PROGRESS NOTE    Ray[place]  [id number] DOB: Feb 08, 1951 DOA: 03/30/2020 PCP: Shon Baton, MD    Chief Complaint  Patient presents with  . Shortness of Breath    CHF    Brief Narrative:  70 year old gentleman with prior history of chronic systolic and diastolic heart failure, atrial fibrillation on anticoagulation, hypertension, obstructive sleep apnea on CPAP, diabetes mellitus, coronary artery disease presents to ED with worsening shortness of breath.  On arrival to ED was found to be hypoxic requiring up to 4 L of nasal cannula oxygen.  Chest x-ray showed pulmonary vascular congestion. He was admitted for acute hypoxic respiratory failure secondary to acute on chronic combined CHF. He is currently on 4 lit of [place] oxygen to keep sats greater than 90%. Cardiology on board and started him on IV milrinone.  Pt reports not feeling good, did not get enough sleep overnight. He was in pain, requesting stronger pain medication.   Assessment & Plan:   Principal Problem:   Acute exacerbation of CHF (congestive heart failure) (HCC) Active Problems:   Type 2 diabetes mellitus with hyperlipidemia (HCC)   OSA on CPAP   Anticoagulated on Coumadin   HTN (hypertension)   A-fib (HCC)   Anemia   Atrial fibrillation with RVR (HCC)   Pressure injury of skin   Acute respiratory failure with hypoxia (HCC)   Gram-negative sepsis (HCC)   Acute hypoxic respiratory failure secondary to acute on chronic systolic and diastolic heart failure requiring up to 4 L of nasal cannula oxygen to keep sats greater than 90%. Chest x-ray on admission shows pulmonary vascular congestion and perihilar edema. Echocardiogram shows left ventricular ejection fraction of 45 to 50% with hypokinesis, dilated right ventricle and right atrium, moderate MR moderate to severe TR.   Cardiology on board , started him on IV Milrinone, initially started him on IV lasix and diuresed about 13 lit since admission, but  held the last as his creatinine worsened to 2.6 to 3.22 today.    Strict intake and output and daily weights. He was restarted back on IV lasix 80 mg BID as his urine output in the last 24 hours is about 500 ml.    Persistent atrial fibrillation Rate not well controlled and currently on IV amiodarone.  Patient will need TEE DCCV once bacteremia/infection clears. On Eliquis for anticoagulation.    Sepsis in the setting of Serratia bacteremia Blood cultures positive for GNR. Patient currently on cefepime. ID on board and appreciate recommendations to continue with cefepime for 7-day course. Repeat blood cultures today. Worsening leukocytosis today from 8.3 to 11.     Essential hypertension Blood pressure parameters appear to be optimal. No changes in meds.    Anemia of chronic disease from CKD  Hemoglobin around 7.7 today.  Transfuse to keep hemoglobin greater than 7.    Hyponatremia  From CKD.    UnControlled diabetes mellitus with hyperglycemia Hemoglobin A1c around 12.4. CBG (last 3)  Recent Labs    04/16/20 2133 04/17/20 0635 04/17/20 1119  GLUCAP 222* 189* 262*   Increased lantus from 38  Units to 42 units  and 8 units tidac and SSI.   Body mass index is 47.33 kg/m. Morbid obesity Poor prognostic factor   Pressure injury stage II pressure injury present on right buttocks,  present on admission:  Pressure Injury 04/04/20 Heel Left Stage 3 -  Full thickness tissue loss. Subcutaneous fat may be visible but bone, tendon or muscle are NOT exposed. (Active)  04/04/20   Location: Heel  Location Orientation: Left  Staging: Stage 3 -  Full thickness tissue loss. Subcutaneous fat may be visible but bone, tendon or muscle are NOT exposed.  Wound Description (Comments):   Present on Admission: Yes     Pressure Injury 04/04/20 Left Stage 3 -  Full thickness tissue loss. Subcutaneous fat may be visible but bone, tendon or muscle are NOT exposed. (Active)  04/04/20    Location:   Location Orientation: Left  Staging: Stage 3 -  Full thickness tissue loss. Subcutaneous fat may be visible but bone, tendon or muscle are NOT exposed.  Wound Description (Comments):   Present on Admission: Yes     Pressure Injury 04/04/20 Buttocks Right Stage 2 -  Partial thickness loss of dermis presenting as a shallow open injury with a red, pink wound bed without slough. (Active)  04/04/20   Location: Buttocks  Location Orientation: Right  Staging: Stage 2 -  Partial thickness loss of dermis presenting as a shallow open injury with a red, pink wound bed without slough.  Wound Description (Comments):   Present on Admission: Yes   Wound care consulted and recommendations given.   Acute on chronic stage IIIb CKD Baseline creatinine around 1.7, creatinine increased to greater than 2.6 to 3.22. Holding Aldactone,  Entresto at this time.  Nephrology consulted for recommendations.  US renal ordered to evaluate for obstruction.    Chronic bilateral venous stasis dermatitis Continue with wound care.    Lung nodule noted on the CT scan Recommend outpatient follow-up with PCP    Hypokalemia and hypomagnesemia Replaced   History of coronary artery disease Patient currently denies any chest pain.    DVT prophylaxis: eliquis.  Code Status: Full code.  Family Communication:  None at bedside.  Disposition:   Status is: Inpatient  Remains inpatient appropriate because:Ongoing diagnostic testing needed not appropriate for outpatient work up, Unsafe d/c plan, IV treatments appropriate due to intensity of illness or inability to take PO and Inpatient level of care appropriate due to severity of illness   Dispo: The patient is from: Home              Anticipated d/c is to: pending.               Anticipated d/c date is: > 3 days              Patient currently is not medically stable to d/c.   Difficult to place patient No       Level of care: Telemetry  Cardiac Consultants:   Cardiology.   Procedures:  Antimicrobials: Antibiotics Given (last 72 hours)    Date/Time Action Medication Dose Rate   04/14/20 1724 New Bag/Given   piperacillin-tazobactam (ZOSYN) IVPB 3.375 g 3.375 g 12.5 mL/hr   04/14/20 1853 New Bag/Given   vancomycin (VANCOCIN) 2,500 mg in sodium chloride 0.9 % 500 mL IVPB 2,500 mg 250 mL/hr   04/14/20 2116 New Bag/Given   piperacillin-tazobactam (ZOSYN) IVPB 3.375 g 3.375 g 12.5 mL/hr   04/15/20 0526 New Bag/Given   ceFEPIme (MAXIPIME) 2 g in sodium chloride 0.9 % 100 mL IVPB 2 g 200 mL/hr   04/15/20 1712 New Bag/Given   ceFEPIme (MAXIPIME) 2 g in sodium chloride 0.9 % 100 mL IVPB 2 g 200 mL/hr   04/16/20 0655 New Bag/Given   ceFEPIme (MAXIPIME) 2 g in sodium chloride 0.9 % 100 mL IVPB 2 g 200 mL/hr   04/16/20 1738 New Bag/Given  ceFEPIme (MAXIPIME) 2 g in sodium chloride 0.9 % 100 mL IVPB 2 g 200 mL/hr   04/17/20 0630 New Bag/Given   ceFEPIme (MAXIPIME) 2 g in sodium chloride 0.9 % 100 mL IVPB 2 g 200 mL/hr       Subjective: REPORTS pain int he back. No change in breathing in the last 2 days. Appears uncomfortable.   Objective: Vitals:   04/17/20 0743 04/17/20 0902 04/17/20 1118 04/17/20 1232  BP: 124/73  105/82   Pulse: (!) 120  (!) 115 (!) 108  Resp: '20  20 18  '$ Temp: (!) 97.5 F (36.4 C)  97.6 F (36.4 C)   TempSrc: Oral  Oral   SpO2: 99%     Weight:  (!) 137.1 kg    Height:        Intake/Output Summary (Last 24 hours) at 04/17/2020 1238 Last data filed at 04/17/2020 0745 Gross per 24 hour  Intake 2142.6 ml  Output 201 ml  Net 1941.6 ml   Filed Weights   04/17/20 0132 04/17/20 0358 04/17/20 0902  Weight: (!) 136.2 kg 135.7 kg (!) 137.1 kg    Examination:  General exam: Ill-appearing gentleman on 4 L of nasal cannula oxygen, not in any kind of distress Respiratory system: Diminished air entry at bases, no wheezing or rhonchi Cardiovascular system: S1-S2 heard, tachycardic, irregularly  irregular Gastrointestinal system: Abdomen is soft, nontender bowel sounds normal Central nervous system: Alert and oriented, grossly nonfocal. Extremities: Lower extremity edema present chronic venous stasis. Skin: Pressure injury on the right buttocks Psychiatry: mood is appropriate   Data Reviewed: I have personally reviewed following labs and imaging studies  CBC: Recent Labs  Lab 04/13/20 0623 04/14/20 1433 04/15/20 0429 04/16/20 0500 04/17/20 0413  WBC 7.9 8.3 8.4 8.3 11.0*  NEUTROABS  --  6.5  --   --   --   HGB 9.5* 10.0* 8.5* 8.3* 7.7*  HCT 33.4* 34.7* 28.1* 28.9* 25.3*  MCV 82.1 82.4 80.1 81.0 79.6*  PLT 389 399 341 331 [id number]    Basic Metabolic Panel: Recent Labs  Lab 04/11/20 0500 04/12/20 0550 04/13/20 0623 04/14/20 0443 04/15/20 0429 04/15/20 0929 04/16/20 0500 04/16/20 1136 04/17/20 0413  NA 137   < > 132* 130* 129* 127*  --  129* 126*  K 3.8   < > 4.2 4.4 4.8 4.7  --  4.6 4.3  CL 94*   < > 88* 86* 89* 86*  --  89* 87*  CO2 32   < > '31 31 29 28  '$ --  27 27  GLUCOSE 169*   < > 294* 333* 215* 412*  --  326*  323* 327*  BUN 32*   < > 37* 41* 46* 47*  --  65* 74*  CREATININE 1.67*   < > 1.86* 1.68* 2.07* 2.09*  --  2.63* 3.22*  CALCIUM 8.8*   < > 8.4* 8.5* 8.4* 7.9*  --  8.1* 8.1*  MG 2.5*  --  2.3  --  2.4  --  2.6*  --   --    < > = values in this interval not displayed.    GFR: Estimated Creatinine Clearance: 29.3 mL/min (A) (by C-G formula based on SCr of 3.22 mg/dL (H)).  Liver Function Tests: Recent Labs  Lab 04/14/20 0443 04/15/20 0429  AST 22 28  ALT 18 22  ALKPHOS 61 71  BILITOT 0.8 1.0  PROT 6.9 6.4*  ALBUMIN 2.8* 2.7*    CBG: Recent Labs  Lab 04/16/20 1413 04/16/20 1615 04/16/20 2133 04/17/20 0635 04/17/20 1119  GLUCAP 272* 238* 222* 189* 262*     Recent Results (from the past 240 hour(s))  Culture, blood (routine x 2)     Status: Abnormal   Collection Time: 04/14/20  9:37 AM   Specimen: BLOOD  Result Value Ref Range  Status   Specimen Description BLOOD RIGHT ANTECUBITAL  Final   Special Requests   Final    BOTTLES DRAWN AEROBIC AND ANAEROBIC Blood Culture results may not be optimal due to an inadequate volume of blood received in culture bottles Performed at [facility] [address], [place], [place] [address]    Culture  Setup Time   Final    CORRECTED RESULTS GRAM NEGATIVE RODS PREVIOUSLY REPORTED AS: GRAM NEGATIVE COCCI CORRECTED RESULTS CALLED TO: PHARMD LYDIA CHEN 0749 [id number] FCP    Culture SERRATIA MARCESCENS (A)  Final   Report Status 04/17/2020 FINAL  Final   Organism ID, Bacteria SERRATIA MARCESCENS  Final      Susceptibility   Serratia marcescens - MIC*    CEFAZOLIN >=64 RESISTANT Resistant     CEFEPIME <=0.12 SENSITIVE Sensitive     CEFTAZIDIME <=1 SENSITIVE Sensitive     CEFTRIAXONE <=0.25 SENSITIVE Sensitive     CIPROFLOXACIN <=0.25 SENSITIVE Sensitive     GENTAMICIN <=1 SENSITIVE Sensitive     TRIMETH/SULFA <=20 SENSITIVE Sensitive     * SERRATIA MARCESCENS  Blood Culture ID Panel (Reflexed)     Status: Abnormal   Collection Time: 04/14/20  9:37 AM  Result Value Ref Range Status   Enterococcus faecalis NOT DETECTED NOT DETECTED Final   Enterococcus Faecium NOT DETECTED NOT DETECTED Final   Listeria monocytogenes NOT DETECTED NOT DETECTED Final   Staphylococcus species NOT DETECTED NOT DETECTED Final   Staphylococcus aureus (BCID) NOT DETECTED NOT DETECTED Final   Staphylococcus epidermidis NOT DETECTED NOT DETECTED Final   Staphylococcus lugdunensis NOT DETECTED NOT DETECTED Final   Streptococcus species NOT DETECTED NOT DETECTED Final   Streptococcus agalactiae NOT DETECTED NOT DETECTED Final   Streptococcus pneumoniae NOT DETECTED NOT DETECTED Final   Streptococcus pyogenes NOT DETECTED NOT DETECTED Final   A.calcoaceticus-baumannii NOT DETECTED NOT DETECTED Final   Bacteroides fragilis NOT DETECTED NOT DETECTED Final   Enterobacterales DETECTED (A) NOT DETECTED  Final    Comment: Enterobacterales represent a large order of gram negative bacteria, not a single organism. CRITICAL RESULT CALLED TO, READ BACK BY AND VERIFIED WITH: [place] ON  04/15/2020    Enterobacter cloacae complex NOT DETECTED NOT DETECTED Final   Escherichia coli NOT DETECTED NOT DETECTED Final   Klebsiella aerogenes NOT DETECTED NOT DETECTED Final   Klebsiella oxytoca NOT DETECTED NOT DETECTED Final   Klebsiella pneumoniae NOT DETECTED NOT DETECTED Final   Proteus species NOT DETECTED NOT DETECTED Final   Salmonella species NOT DETECTED NOT DETECTED Final   Serratia marcescens DETECTED (A) NOT DETECTED Final    Comment: CRITICAL RESULT CALLED TO, READ BACK BY AND VERIFIED WITH: PHARMD AMEND C. BY MESSAN H. AT 0320 ON  04/15/2020    Haemophilus influenzae NOT DETECTED NOT DETECTED Final   Neisseria meningitidis NOT DETECTED NOT DETECTED Final   Pseudomonas aeruginosa NOT DETECTED NOT DETECTED Final   Stenotrophomonas maltophilia NOT DETECTED NOT DETECTED Final   Candida albicans NOT DETECTED NOT DETECTED Final   Candida auris NOT DETECTED NOT DETECTED Final   Candida glabrata NOT DETECTED NOT  DETECTED Final   Candida krusei NOT DETECTED NOT DETECTED Final   Candida parapsilosis NOT DETECTED NOT DETECTED Final   Candida tropicalis NOT DETECTED NOT DETECTED Final   Cryptococcus neoformans/gattii NOT DETECTED NOT DETECTED Final   CTX-M ESBL NOT DETECTED NOT DETECTED Final   Carbapenem resistance IMP NOT DETECTED NOT DETECTED Final   Carbapenem resistance KPC NOT DETECTED NOT DETECTED Final   Carbapenem resistance NDM NOT DETECTED NOT DETECTED Final   Carbapenem resist OXA 48 LIKE NOT DETECTED NOT DETECTED Final   Carbapenem resistance VIM NOT DETECTED NOT DETECTED Final    Comment: Performed at [facility], [place] [address], [place], [place] [address]  Culture, blood (routine x 2)     Status: Abnormal   Collection Time: 04/14/20  9:42 AM    Specimen: BLOOD RIGHT HAND  Result Value Ref Range Status   Specimen Description BLOOD RIGHT HAND  Final   Special Requests   Final    BOTTLES DRAWN AEROBIC AND ANAEROBIC Blood Culture results may not be optimal due to an inadequate volume of blood received in culture bottles   Culture  Setup Time   Final    CORRECTED RESULTS GRAM NEGATIVE RODS PREVIOUSLY REPORTED AS: GRAM NEGATIVE COCCI CORRECTED RESULTS CALLED TO: PHARMD LYDIA CHEN 0749 [id number] FCP    Culture (A)  Final    SERRATIA MARCESCENS SUSCEPTIBILITIES PERFORMED ON PREVIOUS CULTURE WITHIN THE LAST 5 DAYS. Performed at [facility], [place] [address] [place], [place] [address]    Report Status 04/17/2020 FINAL  Final         Radiology Studies: No results found.      Scheduled Meds: . (feeding supplement) PROSource Plus  30 mL Oral TID BM  . apixaban  5 mg Oral BID  . atorvastatin  40 mg Oral Daily  . Chlorhexidine Gluconate Cloth  6 each Topical Daily  . famotidine  20 mg Oral Daily  . furosemide  80 mg Intravenous BID  . insulin aspart  0-20 Units Subcutaneous TID WC  . insulin aspart  8 Units Subcutaneous TID WC  . insulin glargine  38 Units Subcutaneous BID  . metoCLOPramide  10 mg Oral TID AC & HS  . multivitamin with minerals  1 tablet Oral Daily  . sodium chloride flush  3 mL Intravenous Q12H   Continuous Infusions: . sodium chloride 250 mL (04/17/20 0628)  . amiodarone 60 mg/hr (04/17/20 0854)  . ceFEPime (MAXIPIME) IV 2 g (04/17/20 0630)  . [START ON 04/23/2020] ferumoxytol    . milrinone 0.375 mcg/kg/min (04/17/20 1013)     LOS: 14 days       Hosie Poisson, MD [facility]   To contact the attending provider between 7A-7P or the covering provider during after hours 7P-7A, please log into the web site [email or web address] and access using universal [place] password for that web site. If you do not have the password, please call the hospital operator.  04/17/2020, 12:38 PM

## 2020-04-18 ENCOUNTER — Encounter (HOSPITAL_COMMUNITY): Payer: Medicare HMO

## 2020-04-18 ENCOUNTER — Inpatient Hospital Stay (HOSPITAL_COMMUNITY): Payer: Medicare HMO

## 2020-04-18 DIAGNOSIS — I5023 Acute on chronic systolic (congestive) heart failure: Secondary | ICD-10-CM | POA: Diagnosis not present

## 2020-04-18 DIAGNOSIS — A4150 Gram-negative sepsis, unspecified: Secondary | ICD-10-CM | POA: Diagnosis not present

## 2020-04-18 DIAGNOSIS — R7881 Bacteremia: Secondary | ICD-10-CM | POA: Diagnosis not present

## 2020-04-18 DIAGNOSIS — N179 Acute kidney failure, unspecified: Secondary | ICD-10-CM | POA: Diagnosis not present

## 2020-04-18 DIAGNOSIS — J9601 Acute respiratory failure with hypoxia: Secondary | ICD-10-CM | POA: Diagnosis not present

## 2020-04-18 DIAGNOSIS — E8779 Other fluid overload: Secondary | ICD-10-CM | POA: Diagnosis not present

## 2020-04-18 DIAGNOSIS — R0600 Dyspnea, unspecified: Secondary | ICD-10-CM | POA: Diagnosis not present

## 2020-04-18 DIAGNOSIS — A4153 Sepsis due to Serratia: Secondary | ICD-10-CM | POA: Diagnosis not present

## 2020-04-18 DIAGNOSIS — B9689 Other specified bacterial agents as the cause of diseases classified elsewhere: Secondary | ICD-10-CM | POA: Diagnosis not present

## 2020-04-18 DIAGNOSIS — E871 Hypo-osmolality and hyponatremia: Secondary | ICD-10-CM | POA: Diagnosis not present

## 2020-04-18 DIAGNOSIS — I4819 Other persistent atrial fibrillation: Secondary | ICD-10-CM | POA: Diagnosis not present

## 2020-04-18 DIAGNOSIS — I10 Essential (primary) hypertension: Secondary | ICD-10-CM | POA: Diagnosis not present

## 2020-04-18 DIAGNOSIS — I5020 Unspecified systolic (congestive) heart failure: Secondary | ICD-10-CM | POA: Diagnosis not present

## 2020-04-18 LAB — URINE CULTURE: Culture: <10000 — AB

## 2020-04-18 LAB — CBC
HCT: 23.9 % — ABNORMAL LOW (ref 39.0–52.0)
Hemoglobin: 7.2 g/dL — ABNORMAL LOW (ref 13.0–17.0)
MCH: 23.6 pg — ABNORMAL LOW (ref 26.0–34.0)
MCHC: 30.1 g/dL (ref 30.0–36.0)
MCV: 78.4 fL — ABNORMAL LOW (ref 80.0–100.0)
Platelets: 382 10*3/uL (ref 150–400)
RBC: 3.05 MIL/uL — ABNORMAL LOW (ref 4.22–5.81)
RDW: 26.2 % — ABNORMAL HIGH (ref 11.5–15.5)
WBC: 11.6 10*3/uL — ABNORMAL HIGH (ref 4.0–10.5)
nRBC: 0.2 % (ref 0.0–0.2)

## 2020-04-18 LAB — RENAL FUNCTION PANEL
Albumin: 2.2 g/dL — ABNORMAL LOW (ref 3.5–5.0)
Anion gap: 15 (ref 5–15)
BUN: 87 mg/dL — ABNORMAL HIGH (ref 8–23)
CO2: 23 mmol/L (ref 22–32)
Calcium: 7.8 mg/dL — ABNORMAL LOW (ref 8.9–10.3)
Chloride: 83 mmol/L — ABNORMAL LOW (ref 98–111)
Creatinine, Ser: 5.01 mg/dL — ABNORMAL HIGH (ref 0.61–1.24)
GFR, Estimated: 12 mL/min — ABNORMAL LOW (ref >60)
Glucose, Bld: 488 mg/dL — ABNORMAL HIGH (ref 70–99)
Phosphorus: 4.1 mg/dL (ref 2.5–4.6)
Potassium: 4.2 mmol/L (ref 3.5–5.1)
Sodium: 121 mmol/L — ABNORMAL LOW (ref 135–145)

## 2020-04-18 LAB — BASIC METABOLIC PANEL
Anion gap: 17 — ABNORMAL HIGH (ref 5–15)
BUN: 87 mg/dL — ABNORMAL HIGH (ref 8–23)
CO2: 24 mmol/L (ref 22–32)
Calcium: 8.3 mg/dL — ABNORMAL LOW (ref 8.9–10.3)
Chloride: 83 mmol/L — ABNORMAL LOW (ref 98–111)
Creatinine, Ser: 4.4 mg/dL — ABNORMAL HIGH (ref 0.61–1.24)
GFR, Estimated: 14 mL/min — ABNORMAL LOW (ref >60)
Glucose, Bld: 435 mg/dL — ABNORMAL HIGH (ref 70–99)
Potassium: 4.3 mmol/L (ref 3.5–5.1)
Sodium: 124 mmol/L — ABNORMAL LOW (ref 135–145)

## 2020-04-18 LAB — COOXEMETRY PANEL
Carboxyhemoglobin: 1.2 % (ref 0.5–1.5)
Methemoglobin: 0.7 % (ref 0.0–1.5)
O2 Saturation: 47.9 %
Total hemoglobin: 9.2 g/dL — ABNORMAL LOW (ref 12.0–16.0)

## 2020-04-18 LAB — GLUCOSE, CAPILLARY
Glucose-Capillary: 248 mg/dL — ABNORMAL HIGH (ref 70–99)
Glucose-Capillary: 313 mg/dL — ABNORMAL HIGH (ref 70–99)
Glucose-Capillary: 239 mg/dL — ABNORMAL HIGH (ref 70–99)
Glucose-Capillary: 180 mg/dL — ABNORMAL HIGH (ref 70–99)
Glucose-Capillary: 171 mg/dL — ABNORMAL HIGH (ref 70–99)

## 2020-04-18 LAB — PREPARE RBC (CROSSMATCH)

## 2020-04-18 LAB — MRSA PCR SCREENING: MRSA by PCR: NEGATIVE

## 2020-04-18 MED ORDER — INSULIN ASPART 100 UNIT/ML ~~LOC~~ SOLN
10.0000 [IU] | Freq: Three times a day (TID) | SUBCUTANEOUS | Status: DC
  Administered 2020-04-18 – 2020-04-19 (×4): 10 [IU] via SUBCUTANEOUS

## 2020-04-18 MED ORDER — "THROMBI-PAD 3""X3"" EX PADS"
1.0000 | MEDICATED_PAD | Freq: Once | CUTANEOUS | Status: AC
  Administered 2020-04-18: 1 via TOPICAL
  Filled 2020-04-18: qty 1

## 2020-04-18 MED ORDER — DOBUTAMINE IN D5W 4-5 MG/ML-% IV SOLN
2.5000 ug/kg/min | INTRAVENOUS | Status: DC
  Filled 2020-04-18: qty 250

## 2020-04-18 MED ORDER — METOLAZONE 5 MG PO TABS
5.0000 mg | ORAL_TABLET | Freq: Once | ORAL | Status: AC
  Administered 2020-04-18: 5 mg via ORAL
  Filled 2020-04-18: qty 1

## 2020-04-18 MED ORDER — CLONAZEPAM 0.25 MG PO TBDP
0.2500 mg | ORAL_TABLET | Freq: Two times a day (BID) | ORAL | Status: DC
  Administered 2020-04-18 – 2020-04-19 (×2): 0.25 mg via ORAL
  Filled 2020-04-18 (×2): qty 1

## 2020-04-18 MED ORDER — DOBUTAMINE IN D5W 4-5 MG/ML-% IV SOLN
2.5000 ug/kg/min | INTRAVENOUS | Status: DC
  Administered 2020-04-18 – 2020-04-20 (×2): 2.5 ug/kg/min via INTRAVENOUS
  Filled 2020-04-18 (×2): qty 250

## 2020-04-18 MED ORDER — PRISMASOL BGK 4/2.5 32-4-2.5 MEQ/L REPLACEMENT SOLN: Status: DC

## 2020-04-18 MED ORDER — MIDAZOLAM HCL 2 MG/2ML IJ SOLN
INTRAMUSCULAR | Status: AC
  Administered 2020-04-18: 1 mg
  Filled 2020-04-18: qty 2

## 2020-04-18 MED ORDER — FUROSEMIDE 10 MG/ML IJ SOLN
160.0000 mg | Freq: Four times a day (QID) | INTRAVENOUS | Status: DC
  Administered 2020-04-18 – 2020-04-19 (×3): 160 mg via INTRAVENOUS
  Filled 2020-04-18 (×2): qty 16
  Filled 2020-04-18: qty 10
  Filled 2020-04-18 (×3): qty 16
  Filled 2020-04-18: qty 10
  Filled 2020-04-18 (×2): qty 16

## 2020-04-18 MED ORDER — SODIUM CHLORIDE 0.9 % IV SOLN
250.0000 [IU]/h | INTRAVENOUS | Status: DC
  Administered 2020-04-18: 250 [IU]/h via INTRAVENOUS_CENTRAL
  Administered 2020-04-18: 400 [IU]/h via INTRAVENOUS_CENTRAL
  Administered 2020-04-19: 1100 [IU]/h via INTRAVENOUS_CENTRAL
  Filled 2020-04-18 (×3): qty 2

## 2020-04-18 MED ORDER — LIDOCAINE HCL (PF) 1 % IJ SOLN
5.0000 mL | Freq: Once | INTRAMUSCULAR | Status: AC
  Administered 2020-04-18: 5 mL via INTRADERMAL

## 2020-04-18 MED ORDER — HEPARIN SODIUM (PORCINE) 1000 UNIT/ML DIALYSIS
1000.0000 [IU] | INTRAMUSCULAR | Status: DC | PRN
  Administered 2020-04-18: 3200 [IU] via INTRAVENOUS_CENTRAL
  Filled 2020-04-18: qty 1
  Filled 2020-04-18 (×3): qty 6

## 2020-04-18 MED ORDER — INSULIN GLARGINE 100 UNIT/ML ~~LOC~~ SOLN
45.0000 [IU] | Freq: Two times a day (BID) | SUBCUTANEOUS | Status: DC
  Administered 2020-04-18 – 2020-04-20 (×5): 45 [IU] via SUBCUTANEOUS
  Filled 2020-04-18 (×7): qty 0.45

## 2020-04-18 MED ORDER — HEPARIN BOLUS VIA INFUSION (CRRT)
1000.0000 [IU] | INTRAVENOUS | Status: DC | PRN
  Filled 2020-04-18: qty 1000

## 2020-04-18 MED ORDER — ATROPINE SULFATE 1 MG/10ML IJ SOSY
PREFILLED_SYRINGE | INTRAMUSCULAR | Status: AC
  Filled 2020-04-18: qty 10

## 2020-04-18 MED ORDER — SODIUM CHLORIDE 0.9% IV SOLUTION: Freq: Once | INTRAVENOUS | Status: DC

## 2020-04-18 MED ORDER — PRISMASOL BGK 4/2.5 32-4-2.5 MEQ/L EC SOLN: Status: DC

## 2020-04-18 MED ORDER — MIDAZOLAM HCL 2 MG/2ML IJ SOLN
1.0000 mg | Freq: Four times a day (QID) | INTRAMUSCULAR | Status: DC | PRN
  Administered 2020-04-18 – 2020-04-20 (×4): 1 mg via INTRAVENOUS
  Filled 2020-04-18 (×5): qty 2

## 2020-04-18 NOTE — Progress Notes (Addendum)
[facility] NEPHROLOGY PROGRESS NOTE  Assessment/ Plan: Pt is a 70 y.o. yo male  with HFrEF (9/20 25-30%, 03/2020 45-50%), atrial fibrillation,  OSA, DM type 2, obesity BMI > 45, HTN who is admitted since 1/27 with dyspnea/volume overload who seen for evaluation and management of AKI.    #Acute on chronic systolic CHF: Decompensated heart failure.  Currently on inotrope support with dobutamine.  Minimal response with diuretics therefore increasing Lasix to 160 mg IV every 6 hour and adding metolazone to augment diuresis.  Strict ins and out, daily weight.  Cardiology is following.  #Acute kidney injury on CKD 3 due to cardiorenal syndrome/bacteremia.  Diuretics as above.  If no improvement with high-dose diuretics then he may be heading towards requiring RRT.  Discussed with the patient that he may not be a candidate for long-term outpatient dialysis.  Patient understands and agrees for short-term dialysis/CRRT when needed.  #Serratia bacteremia: Currently on cefepime.  ID following.  #Hyponatremia, hypervolemic: Diuretics as ordered.  Fluid restriction.  Monitor lab.  #A. fib with RVR: Tachycardic.  Currently on amiodarone IV and Eliquis.  #Anemia of critical illness/renal failure: Received iron.  Order dose of ESA today.  Addendum 2:30 PM: Discussed with Dr. Haroldine Laws for heart failure team.  He is respiratory status worsening further without much response from high-dose Lasix.  Decided to transfer to to heart and do trial of CRRT for 48 hours.  Apparently patient does not want chest compression or intubation.  If no improvement after CRRT for 48 hours then I recommend comfort care.  He is not a candidate for outpatient dialysis. Start CVVHD, all 4K bath, IV heparin, UF 150 cc an hour net negative  Subjective: Seen and examined at bedside.  Patient was eating lunch.  Urine output only 325 cc with IV Lasix.  Changed medication dose today.  He reports short of breath but denies  nausea vomiting or chest pain. Objective Vital signs in last 24 hours: Vitals:   04/18/20 0355 04/18/20 0400 04/18/20 0500 04/18/20 1114  BP: 128/76   125/70  Pulse:    (!) 108  Resp: (!) 22   (!) 21  Temp: 97.7 F (36.5 C)   98.3 F (36.8 C)  TempSrc: Oral   Oral  SpO2: 94% 94% 93% 97%  Weight:      Height:       Weight change: 0.862 kg  Intake/Output Summary (Last 24 hours) at 04/18/2020 1300 Last data filed at 04/18/2020 1028 Gross per 24 hour  Intake 2412.52 ml  Output 425 ml  Net 1987.52 ml       Labs: Basic Metabolic Panel: Recent Labs  Lab 04/17/20 0413 04/17/20 1600 04/18/20 0411  NA 126* 128* 124*  K 4.3 4.5 4.3  CL 87* 88* 83*  CO2 '27 26 24  '$ GLUCOSE 327* 188* 435*  BUN 74* 82* 87*  CREATININE 3.22* 4.02* 4.40*  CALCIUM 8.1* 8.4* 8.3*   Liver Function Tests: Recent Labs  Lab 04/14/20 0443 04/15/20 0429  AST 22 28  ALT 18 22  ALKPHOS 61 71  BILITOT 0.8 1.0  PROT 6.9 6.4*  ALBUMIN 2.8* 2.7*   No results for input(s): LIPASE, AMYLASE in the last 168 hours. No results for input(s): AMMONIA in the last 168 hours. CBC: Recent Labs  Lab 04/14/20 1433 04/15/20 0429 04/16/20 0500 04/17/20 0413 04/17/20 1700 04/18/20 0411  WBC 8.3 8.4 8.3 11.0* 13.3* 11.6*  NEUTROABS 6.5  --   --   --   --   --  HGB 10.0* 8.5* 8.3* 7.7* 8.2* 7.2*  HCT 34.7* 28.1* 28.9* 25.3* 26.6* 23.9*  MCV 82.4 80.1 81.0 79.6* 78.0* 78.4*  PLT 399 341 331 330 419* 382   Cardiac Enzymes: No results for input(s): CKTOTAL, CKMB, CKMBINDEX, TROPONINI in the last 168 hours. CBG: Recent Labs  Lab 04/17/20 1119 04/17/20 1558 04/17/20 2218 04/18/20 0638 04/18/20 1115  GLUCAP 262* 175* 246* 248* 313*    Iron Studies: No results for input(s): IRON, TIBC, TRANSFERRIN, FERRITIN in the last 72 hours. Studies/Results: US RENAL  Result Date: 04/17/2020 CLINICAL DATA:  AK I EXAM: RENAL / URINARY TRACT ULTRASOUND COMPLETE COMPARISON:  None. FINDINGS: Right Kidney: Renal  measurements: 12.4 x 6.5 x 5.2 cm = volume: 217 mL. Echogenicity within normal limits. No mass or hydronephrosis visualized. Left Kidney: Renal measurements: 12.4 x 6.4 x 6.3 cm = volume: 260 mL. Echogenicity within normal limits. No mass or hydronephrosis visualized. Bladder: Bladder is decompressed. Other: None. IMPRESSION: Normal renal ultrasound.  No hydronephrosis. Electronically Signed   By: Dahlia Bailiff MD   On: 04/17/2020 23:40    Medications: Infusions: . sodium chloride Stopped (04/17/20 1931)  . amiodarone 60 mg/hr (04/18/20 1028)  . ceFEPime (MAXIPIME) IV    . DOBUTamine 2.5 mcg/kg/min (04/18/20 1222)  . [START ON 04/23/2020] ferumoxytol    . furosemide      Scheduled Medications: . sodium chloride   Intravenous Once  . apixaban  5 mg Oral BID  . atorvastatin  40 mg Oral Daily  . Chlorhexidine Gluconate Cloth  6 each Topical Daily  . insulin aspart  0-20 Units Subcutaneous TID WC  . insulin aspart  10 Units Subcutaneous TID WC  . insulin glargine  42 Units Subcutaneous BID  . multivitamin with minerals  1 tablet Oral Daily  . sodium chloride flush  3 mL Intravenous Q12H    have reviewed scheduled and prn medications.  Physical Exam: General:NAD, comfortable Heart: Tachycardic, s1s2 nl Lungs: Coarse crackles bilateral, no increased work of breathing Abdomen:soft, Non-tender, non-distended Extremities: Lower extremities edema present. Neurology: Alert awake and following commands, no asterixis  Shawne Eskelson Tanna Furry 04/18/2020,1:00 PM  LOS: 15 days

## 2020-04-18 NOTE — Progress Notes (Addendum)
Advanced Heart Failure Rounding Note   Subjective:    2/7 febrile. Bld Cx 1/2 Serratia Marcescens + enterobacterales. ID following. On cefepime. PCT 1.63. WBC 11.6.   On milrinone 0.375 mcg. Co-ox low at 51%. SCr continues to trend up. Wt increasing. CVP 22 sitting up.    Raymond Pratt sitting up in chair, denies CP/SOB.  Reports joint pain, pain with urination.   K 4.3  Na 124 --> corrected to glucose, Na 129.  Hgb 7.2 ? Dilutional previous Hgb 8.2 then 7.7 no PRBCs  Remains in Afib, 110s.   Objective:   Weight Range:  Vital Signs:   Temp:  [97.6 F (36.4 C)-98.3 F (36.8 C)] 97.7 F (36.5 C) (02/11 0355) Pulse Rate:  [102-115] 109 (02/11 0022) Resp:  [18-22] 22 (02/11 0355) BP: (90-148)/(56-82) 128/76 (02/11 0355) SpO2:  [84 %-100 %] 93 % (02/11 0500) Weight:  [137.1 kg-139.1 kg] 139.1 kg (02/11 0349) Last BM Date: 04/17/20  Weight change: Filed Weights   04/17/20 0358 04/17/20 0902 04/18/20 0349  Weight: 135.7 kg (!) 137.1 kg (!) 139.1 kg    Intake/Output:   Intake/Output Summary (Last 24 hours) at 04/18/2020 0859 Last data filed at 04/18/2020 0845 Gross per 24 hour  Intake 2432.52 ml  Output 325 ml  Net 2107.52 ml    PHYSICAL EXAM: CVP 22 General:  Chronic ill appearing HEENT: normal Neck: supple. + JVD.  Cor: Iregular rate & rhythm. No rubs, gallops or murmurs. Lungs: Clear b/l.  Abdomen: soft, nontender, nondistended. Good bowel sounds. Extremities: no cyanosis, clubbing, rash. + edema Neuro: alert & oriented x 3. Affect pleasant  Telemetry:  Afib with rates 110s.  Personally reviewed.   Labs: Basic Metabolic Panel: Recent Labs  Lab 04/13/20 0623 04/14/20 0443 04/15/20 0429 04/15/20 0929 04/16/20 0500 04/16/20 1136 04/17/20 0413 04/17/20 1600 04/18/20 0411  NA 132*   < > 129* 127*  --  129* 126* 128* 124*  K 4.2   < > 4.8 4.7  --  4.6 4.3 4.5 4.3  CL 88*   < > 89* 86*  --  89* 87* 88* 83*  CO2 31   < > 29 28  --  '27 27 26 24   '$ GLUCOSE 294*   < > 215* 412*  --  326*  323* 327* 188* 435*  BUN 37*   < > 46* 47*  --  65* 74* 82* 87*  CREATININE 1.86*   < > 2.07* 2.09*  --  2.63* 3.22* 4.02* 4.40*  CALCIUM 8.4*   < > 8.4* 7.9*  --  8.1* 8.1* 8.4* 8.3*  MG 2.3  --  2.4  --  2.6*  --   --   --   --    < > = values in this interval not displayed.    Liver Function Tests: Recent Labs  Lab 04/14/20 0443 04/15/20 0429  AST 22 28  ALT 18 22  ALKPHOS 61 71  BILITOT 0.8 1.0  PROT 6.9 6.4*  ALBUMIN 2.8* 2.7*   No results for input(s): LIPASE, AMYLASE in the last 168 hours. No results for input(s): AMMONIA in the last 168 hours.  CBC: Recent Labs  Lab 04/14/20 1433 04/15/20 0429 04/16/20 0500 04/17/20 0413 04/17/20 1700 04/18/20 0411  WBC 8.3 8.4 8.3 11.0* 13.3* 11.6*  NEUTROABS 6.5  --   --   --   --   --   HGB 10.0* 8.5* 8.3* 7.7* 8.2* 7.2*  HCT 34.7*  28.1* 28.9* 25.3* 26.6* 23.9*  MCV 82.4 80.1 81.0 79.6* 78.0* 78.4*  PLT 399 341 331 330 419* 382    Cardiac Enzymes: No results for input(s): CKTOTAL, CKMB, CKMBINDEX, TROPONINI in the last 168 hours.  BNP: BNP (last 3 results) Recent Labs    12/10/19 0057 02/13/20 0807 03/13/2020 2019  BNP 154.7* 111.6* 195.0*    ProBNP (last 3 results) No results for input(s): PROBNP in the last 8760 hours.    Other results:  Imaging: US RENAL  Result Date: 04/17/2020 CLINICAL DATA:  AK I EXAM: RENAL / URINARY TRACT ULTRASOUND COMPLETE COMPARISON:  None. FINDINGS: Right Kidney: Renal measurements: 12.4 x 6.5 x 5.2 cm = volume: 217 mL. Echogenicity within normal limits. No mass or hydronephrosis visualized. Left Kidney: Renal measurements: 12.4 x 6.4 x 6.3 cm = volume: 260 mL. Echogenicity within normal limits. No mass or hydronephrosis visualized. Bladder: Bladder is decompressed. Other: None. IMPRESSION: Normal renal ultrasound.  No hydronephrosis. Electronically Signed   By: Dahlia Bailiff MD   On: 04/17/2020 23:40     Medications:     Scheduled  Medications: . apixaban  5 mg Oral BID  . atorvastatin  40 mg Oral Daily  . Chlorhexidine Gluconate Cloth  6 each Topical Daily  . furosemide  80 mg Intravenous BID  . insulin aspart  0-20 Units Subcutaneous TID WC  . insulin aspart  8 Units Subcutaneous TID WC  . insulin glargine  42 Units Subcutaneous BID  . multivitamin with minerals  1 tablet Oral Daily  . sodium chloride flush  3 mL Intravenous Q12H    Infusions: . sodium chloride Stopped (04/17/20 1931)  . amiodarone 60 mg/hr (04/18/20 0252)  . ceFEPime (MAXIPIME) IV    . [START ON 04/23/2020] ferumoxytol    . milrinone 0.375 mcg/kg/min (04/18/20 0656)    PRN Medications: sodium chloride, acetaminophen **OR** acetaminophen, alum & mag hydroxide-simeth, [COMPLETED] diphenhydrAMINE **FOLLOWED BY** diphenhydrAMINE, hydrocortisone cream, hydrOXYzine, loperamide, Muscle Rub, ondansetron (ZOFRAN) IV, polyethylene glycol, sodium chloride flush   Assessment/Plan:   1.  Acute on chronic systolic heart failure - Echo (11/2018) EF 25-30%, trace MR, trivial TR, moderately reduced RSVF.   - Cath (12/2018) RA 10, RV 55/8, PA 54/15 (27), PCW = 16, Fick CO/CI = 7.0/2.8 - Echo (04/04/2020) EF 45-50%, mild LVH, hypokinesis LV, dilated LA, dilated RV/RA, mod MR, mod-severe TR, RVSP > 60 mmHg.  - Milrinone started on 1/29 for low CO-OX.  - Todays CO-OX 51% on milrinone 0.375 mcg.  SCr rising, 4.4 today  - Wt trending up and CVP ~22.  - Change milrinone for DBA. - Discussed starting HD/CVVD, he was disagreeable on starting now. Will try to increase lasix for now.  - Likely triggered by AF, monitor closely while on inotropes.  - Holding Yaak and dig for now.  - Hold spiro for now given AKI.    2. Persistent AF - has been on amio at home - suspect has been in AF for several weeks. ECG in 12/21 was NSR - Last week he went back in NSR but over the weekend back in A fib RVR.  - Will need cardioversion, once infection clears.  - Continue  amio to 60 mg per hour.  Monitor on tele and rebolus if needed  - TFTs not too bad - continue Eliquis  3. Gram-negative sepsis -2/7 Fever. Lactic Acid 2.2>1.2  - WBC 8.3>>11  - PCT 1.63 -2/7Bld Cx 1/2-Serratia Marcescens + enterobacterales - Antibiotics switched to cefepime .  ID following  - UA negative  4. Acute on chronic hypoxic respiratory failure/OSA - Continue with CPAP at night for OSA - He has been refusing CPAP.   5. AKI on CKD, Stage 3b - Baseline Cr 1.5-1.7 - SCr 1.7 >2.1>>2.6>>3.22>>4.4 - Renal u/s no acute findings.  - Change milrinone for DBA and increase lasix 160 Q6H; patient does not want HD/CVVD at this point will continue to evaluated.  - Off Arlyce Harman - Appreciate nephro's recommendations.      6. Venous stasis wounds L tib/fib, L heel & Sacral wound - Wound care consulted. Primary team managing   7. T2DM - A1c 12.4% recent hospitalization - SSI Per primary team - Would be concerned about SGT2i with mild erythema under pannus  8. Morbidly obese Body mass index is 48.04 kg/m.  9. Lung nodule - Will need outpatient imaging in 3 months, 9 mm CT (03/2020)  10. Hypokalemia/ Hypomagnesemia -K /Mag stable.  - Keep K> 4.0 Mg > 2.0   11. Hypervolemic Hyponatremia - Corrected Sodium 129. Restrict free water.    12. Anemia/Hx of GIB - Hgb down 7.2 but ? Dilutional. No obvious source of bleeding  - EGD 12/2019 nonbleeding duodenal ulcer with gastritis - Received Feraheme 04/09/20  - monitor closely, defer transfusion to IM   Length of Stay: [place] NP  04/18/2020, 8:59 AM  Advanced Heart Failure Team Pager [phone] (M-F; 7a - 4p)  Please contact [facility] for night-coverage after hours (4p -7a ) and weekends on [email or web address]  Agree with above.   He is much worse today. Renal function worsening. Increasing volume overload and SOB with audible wheezing. Not responding to high-dose lasix and metolazone.   General:  Ill appearing.  Audible wheezing HEENT: normal Neck: supple. JVP to ear . Carotids 2+ bilat; no bruits. No lymphadenopathy or thryomegaly appreciated. Cor: PMI nondisplaced. irregular tachy Lungs: + wheezing Abdomen: obese soft, nontender, ++ distended. No hepatosplenomegaly. No bruits or masses. Good bowel sounds. Extremities: no cyanosis, clubbing, rash, 3+ edema Neuro: alert & orientedx3, cranial nerves grossly intact. moves all 4 extremities w/o difficulty. Affect pleasant  Long talk about his situation. He has ATN in setting of sepsis. He is not a candidate for long-term HD with low EF. We discussed short-term CVVHD to see if renal function will recover vs shift to comfort care. He would like to go with CVVHD. He understands that there is at least a 50% chance his kidneys won't recover. He is clear that he would not want to be intubated or undergo CPR or defibrillation.   I called his 2 family members from his phone but they did not answer. I left VMs.   Will move to ICU and start CVVHD. D/w Dr. Carolin Sicks. Will stop Eliquis.   CRITICAL CARE Performed by: [name]  Total critical care time: 45 minutes  Critical care time was exclusive of separately billable procedures and treating other patients.  Critical care was necessary to treat or prevent imminent or life-threatening deterioration.  Critical care was time spent personally by me (independent of midlevel providers or residents) on the following activities: development of treatment plan with patient and/or surrogate as well as nursing, discussions with consultants, evaluation of patient's response to treatment, examination of patient, obtaining history from patient or surrogate, ordering and performing treatments and interventions, ordering and review of laboratory studies, ordering and review of radiographic studies, pulse oximetry and re-evaluation of patient's condition.   [name], MD  2:13 PM

## 2020-04-18 NOTE — Progress Notes (Signed)
Pt desat to 84% on 3L [place] while asleep. Hx OSA, but pt refusing CPAP. O2 increased to 4L, then to 5L. Sat > 90% achieved. Pt weaned back down to 4L [place].

## 2020-04-18 NOTE — Progress Notes (Signed)
PT Cancellation Note  Patient Details Name: Raymond Pratt MRN: [id number] DOB: [birth year]   Cancelled Treatment:    Reason Eval/Treat Not Completed: Medical issues which prohibited therapy Pt transferred to ICU and started on CRRT. PT will follow back for treatment next week.   Darrik Richman B. Migdalia Dk PT, DPT Acute Rehabilitation Services Pager [phone] Office [phone]   [place] 04/18/2020, 2:26 PM

## 2020-04-18 NOTE — Progress Notes (Signed)
Raymond Pratt for Infectious Disease   Reason for visit: Follow up on Serratia bacteremia  Interval History: WBC stable, remains afebrile.  No new complaints.   Cefepime day 5   Physical Exam: Constitutional:  Vitals:   04/18/20 0400 04/18/20 0500  BP:    Pulse:    Resp:    Temp:    SpO2: 94% 93%  he is in nad Cardiovascular: RRR SKin: no rash  Review of Systems: Constitutional: no fever or chills Gastrointestinal: he states he has been having diarrhea but improved on imodium.   Lab Results  Component Value Date   WBC 11.6 (H) 04/18/2020   HGB 7.2 (L) 04/18/2020   HCT 23.9 (L) 04/18/2020   MCV 78.4 (L) 04/18/2020   PLT 382 04/18/2020    Lab Results  Component Value Date   CREATININE 4.40 (H) 04/18/2020   BUN 87 (H) 04/18/2020   NA 124 (L) 04/18/2020   K 4.3 04/18/2020   CL 83 (L) 04/18/2020   CO2 24 04/18/2020    Lab Results  Component Value Date   ALT 22 04/15/2020   AST 28 04/15/2020   ALKPHOS 71 04/15/2020     Microbiology: Recent Results (from the past 240 hour(s))  Culture, blood (routine x 2)     Status: Abnormal   Collection Time: 04/14/20  9:37 AM   Specimen: BLOOD  Result Value Ref Range Status   Specimen Description BLOOD RIGHT ANTECUBITAL  Final   Special Requests   Final    BOTTLES DRAWN AEROBIC AND ANAEROBIC Blood Culture results may not be optimal due to an inadequate volume of blood received in culture bottles Performed at [facility] [address], [place], [place] [address]    Culture  Setup Time   Final    CORRECTED RESULTS GRAM NEGATIVE RODS PREVIOUSLY REPORTED AS: GRAM NEGATIVE COCCI CORRECTED RESULTS CALLED TO: [place]ron [id number] FCP    Culture SERRATIA MARCESCENS (A)  Final   Report Status 04/17/2020 FINAL  Final   Organism ID, Bacteria SERRATIA MARCESCENS  Final      Susceptibility   Serratia marcescens - MIC*    CEFAZOLIN >=64 RESISTANT Resistant     CEFEPIME <=0.12 SENSITIVE Sensitive      CEFTAZIDIME <=1 SENSITIVE Sensitive     CEFTRIAXONE <=0.25 SENSITIVE Sensitive     CIPROFLOXACIN <=0.25 SENSITIVE Sensitive     GENTAMICIN <=1 SENSITIVE Sensitive     TRIMETH/SULFA <=20 SENSITIVE Sensitive     * SERRATIA MARCESCENS  Blood Culture ID Panel (Reflexed)     Status: Abnormal   Collection Time: 04/14/20  9:37 AM  Result Value Ref Range Status   Enterococcus faecalis NOT DETECTED NOT DETECTED Final   Enterococcus Faecium NOT DETECTED NOT DETECTED Final   Listeria monocytogenes NOT DETECTED NOT DETECTED Final   Staphylococcus species NOT DETECTED NOT DETECTED Final   Staphylococcus aureus (BCID) NOT DETECTED NOT DETECTED Final   Staphylococcus epidermidis NOT DETECTED NOT DETECTED Final   Staphylococcus lugdunensis NOT DETECTED NOT DETECTED Final   Streptococcus species NOT DETECTED NOT DETECTED Final   Streptococcus agalactiae NOT DETECTED NOT DETECTED Final   Streptococcus pneumoniae NOT DETECTED NOT DETECTED Final   Streptococcus pyogenes NOT DETECTED NOT DETECTED Final   A.calcoaceticus-baumannii NOT DETECTED NOT DETECTED Final   Bacteroides fragilis NOT DETECTED NOT DETECTED Final   Enterobacterales DETECTED (A) NOT DETECTED Final    Comment: Enterobacterales represent a large order of gram negative bacteria, not a single  organism. CRITICAL RESULT CALLED TO, READ BACK BY AND VERIFIED WITH: [place] ON  04/15/2020    Enterobacter cloacae complex NOT DETECTED NOT DETECTED Final   Escherichia coli NOT DETECTED NOT DETECTED Final   Klebsiella aerogenes NOT DETECTED NOT DETECTED Final   Klebsiella oxytoca NOT DETECTED NOT DETECTED Final   Klebsiella pneumoniae NOT DETECTED NOT DETECTED Final   Proteus species NOT DETECTED NOT DETECTED Final   Salmonella species NOT DETECTED NOT DETECTED Final   Serratia marcescens DETECTED (A) NOT DETECTED Final    Comment: CRITICAL RESULT CALLED TO, READ BACK BY AND VERIFIED WITH: PHARMD AMEND C. BY MESSAN H. AT  0320 ON  04/15/2020    Haemophilus influenzae NOT DETECTED NOT DETECTED Final   Neisseria meningitidis NOT DETECTED NOT DETECTED Final   Pseudomonas aeruginosa NOT DETECTED NOT DETECTED Final   Stenotrophomonas maltophilia NOT DETECTED NOT DETECTED Final   Candida albicans NOT DETECTED NOT DETECTED Final   Candida auris NOT DETECTED NOT DETECTED Final   Candida glabrata NOT DETECTED NOT DETECTED Final   Candida krusei NOT DETECTED NOT DETECTED Final   Candida parapsilosis NOT DETECTED NOT DETECTED Final   Candida tropicalis NOT DETECTED NOT DETECTED Final   Cryptococcus neoformans/gattii NOT DETECTED NOT DETECTED Final   CTX-M ESBL NOT DETECTED NOT DETECTED Final   Carbapenem resistance IMP NOT DETECTED NOT DETECTED Final   Carbapenem resistance KPC NOT DETECTED NOT DETECTED Final   Carbapenem resistance NDM NOT DETECTED NOT DETECTED Final   Carbapenem resist OXA 48 LIKE NOT DETECTED NOT DETECTED Final   Carbapenem resistance VIM NOT DETECTED NOT DETECTED Final    Comment: Performed at [facility], [address] [place], [place] [address]  Culture, blood (routine x 2)     Status: Abnormal   Collection Time: 04/14/20  9:42 AM   Specimen: BLOOD RIGHT HAND  Result Value Ref Range Status   Specimen Description BLOOD RIGHT HAND  Final   Special Requests   Final    BOTTLES DRAWN AEROBIC AND ANAEROBIC Blood Culture results may not be optimal due to an inadequate volume of blood received in culture bottles   Culture  Setup Time   Final    CORRECTED RESULTS GRAM NEGATIVE RODS PREVIOUSLY REPORTED AS: GRAM NEGATIVE COCCI CORRECTED RESULTS CALLED TO: PHARMD LYDIA CHEN [id number] FCP    Culture (A)  Final    SERRATIA MARCESCENS SUSCEPTIBILITIES PERFORMED ON PREVIOUS CULTURE WITHIN THE LAST 5 DAYS. Performed at [facility], [place] [address], [place], [place] [address]    Report Status 04/17/2020 FINAL  Final  Culture, blood (Routine X 2) w Reflex to ID Panel     Status: None  (Preliminary result)   Collection Time: 04/17/20  2:16 PM   Specimen: BLOOD RIGHT HAND  Result Value Ref Range Status   Specimen Description BLOOD RIGHT HAND  Final   Special Requests   Final    BOTTLES DRAWN AEROBIC AND ANAEROBIC Blood Culture adequate volume   Culture   Final    NO GROWTH < 24 HOURS Performed at [facility], [place] [address] [place], [place] [address]    Report Status PENDING  Incomplete  Culture, blood (Routine X 2) w Reflex to ID Panel     Status: None (Preliminary result)   Collection Time: 04/17/20  2:17 PM   Specimen: BLOOD  Result Value Ref Range Status   Specimen Description BLOOD RIGHT ANTECUBITAL  Final  Special Requests   Final    BOTTLES DRAWN AEROBIC AND ANAEROBIC Blood Culture results may not be optimal due to an inadequate volume of blood received in culture bottles   Culture   Final    NO GROWTH < 24 HOURS Performed at [facility] [address] [place], [place] [address]    Report Status PENDING  Incomplete    Impression/Plan:  1. Serratia bacteremia - doing well on cefpime and will continue for 7 days, no currently day 5.    2. Fever - he remains afebrile.    I will sign off, call with questions.

## 2020-04-18 NOTE — CV Procedure (Signed)
Central Venous Catheter Insertion Procedure Note Samnang Cayson [id number] [birth year]    Procedure: Insertion of Central Venous Catheter Indications: CVVHD   Procedure Details Consent: Risks of procedure as well as the alternatives and risks of each were explained to the (patient/caregiver).  Consent for procedure obtained. Time Out: Verified patient identification, verified procedure, site/side was marked, verified correct patient position, special equipment/implants available, medications/allergies/relevent history reviewed, required imaging and test results available.  Performed   Maximum sterile technique was used including antiseptics, cap, gloves, gown, hand hygiene, mask and sheet. Skin prep: Chlorhexidine; local anesthetic administered A antimicrobial bonded/coated 14FR double lumen catheter was placed in the left internal jugular vein using the Seldinger technique and u/s guidance.   Evaluation Blood flow good Complications: No apparent complications Patient did tolerate procedure well. Chest X-ray ordered to verify placement.  CXR: with good palcement.   [name], MD  4:59 PM

## 2020-04-18 NOTE — Progress Notes (Signed)
PROGRESS NOTE    Raymond Pratt  [id number] DOB: November 26, 1950 DOA: 03/21/2020 PCP: Shon Baton, MD    Chief Complaint  Patient presents with  . Shortness of Breath    CHF    Brief Narrative:  70 year old gentleman with prior history of chronic systolic and diastolic heart failure, atrial fibrillation on anticoagulation, hypertension, obstructive sleep apnea on CPAP, diabetes mellitus, coronary artery disease presents to ED with worsening shortness of breath.  On arrival to ED was found to be hypoxic requiring up to 4 L of nasal cannula oxygen.  Chest x-ray showed pulmonary vascular congestion. He was admitted for acute hypoxic respiratory failure secondary to acute on chronic combined CHF. He is currently on 4 lit of [place] oxygen to keep sats greater than 90%. Cardiology on board and started him on IV milrinone.  Hospital course complicated by worsening renal parameters, nephrology consulted, unfortunately he is not a candidate for outpatient dialysis. Pt understands and agreed for short term HD/ CRRT for 48 hours . If no improvement  In renal parameters after CRRT for 48 hours, nephrology recommends comfort measures as he is not a candidate for outpatient dialysis. Patient reports his breathing is the same as yesterday or this may be slightly worse.  Assessment & Plan:   Principal Problem:   Acute exacerbation of CHF (congestive heart failure) (HCC) Active Problems:   Type 2 diabetes mellitus with hyperlipidemia (HCC)   OSA on CPAP   Anticoagulated on Coumadin   HTN (hypertension)   A-fib (HCC)   Anemia   Atrial fibrillation with RVR (HCC)   Pressure injury of skin   Acute respiratory failure with hypoxia (HCC)   Gram-negative sepsis (HCC)   Acute hypoxic respiratory failure secondary to acute on chronic systolic and diastolic heart failure requiring up to 4 L of nasal cannula oxygen to keep sats greater than 90%. Chest x-ray on admission shows pulmonary vascular  congestion and perihilar edema. Echocardiogram shows left ventricular ejection fraction of 45 to 50% with hypokinesis, dilated right ventricle and right atrium, moderate MR moderate to severe TR.  Cardiology on board , started him on IV Milrinone, transition to IV dobutamine along with high-dose IV Lasix  Strict intake and output and daily weights.      Persistent atrial fibrillation Rate not well controlled and currently on IV amiodarone.  Patient will need TEE DCCV once bacteremia/infection clears. Eliquis on hold. Transitioned to heparin.     Sepsis in the setting of Serratia bacteremia Blood cultures positive for GNR. Patient currently on cefepime. ID on board and appreciate recommendations to continue with cefepime for 7-day course.  DC antibiotics after the last dose on 04/20/2020 Repeat blood cultures today. Worsening leukocytosis today from 8.3 to 11.     Essential hypertension Blood pressure parameters appear to be optimal. No changes in meds.    Anemia of chronic disease from CKD  Hemoglobin around 7.2 today.  Transfuse 1 unit of prbc transfusion. Recheck H&H in am Transfuse to keep hemoglobin greater than 7.    Hyponatremia  From CKD and fluid overload. .    UnControlled diabetes mellitus with hyperglycemia Hemoglobin A1c around 12.4. CBG (last 3)  Recent Labs    04/17/20 2218 04/18/20 0638 04/18/20 1115  GLUCAP 246* 248* 313*   Increased lantus from 45 units BID, novolog 10 units TIDAC and SSI.     Body mass index is 48.04 kg/m. Morbid obesity Poor prognostic factor   Pressure injury stage II pressure injury present on  right buttocks,  present on admission:  Pressure Injury 04/04/20 Heel Left Stage 3 -  Full thickness tissue loss. Subcutaneous fat may be visible but bone, tendon or muscle are NOT exposed. (Active)  04/04/20   Location: Heel  Location Orientation: Left  Staging: Stage 3 -  Full thickness tissue loss. Subcutaneous fat may be  visible but bone, tendon or muscle are NOT exposed.  Wound Description (Comments):   Present on Admission: Yes     Pressure Injury 04/04/20 Left Stage 3 -  Full thickness tissue loss. Subcutaneous fat may be visible but bone, tendon or muscle are NOT exposed. (Active)  04/04/20   Location:   Location Orientation: Left  Staging: Stage 3 -  Full thickness tissue loss. Subcutaneous fat may be visible but bone, tendon or muscle are NOT exposed.  Wound Description (Comments):   Present on Admission: Yes     Pressure Injury 04/04/20 Buttocks Right Stage 2 -  Partial thickness loss of dermis presenting as a shallow open injury with a red, pink wound bed without slough. (Active)  04/04/20   Location: Buttocks  Location Orientation: Right  Staging: Stage 2 -  Partial thickness loss of dermis presenting as a shallow open injury with a red, pink wound bed without slough.  Wound Description (Comments):   Present on Admission: Yes   Wound care consulted and recommendations given.   Acute on chronic stage IIIb CKD Baseline creatinine around 1.7, creatinine increased to greater than 2.6 to 3.22 to 4.02 to 4.4. Holding Aldactone, Entresto at this time.  Nephrology consulted unfortunately he is not a candidate for outpatient dialysis. Pt understands and agreed for short term HD/ CRRT for 48 hours . If no improvement  In renal parameters after CRRT for 48 hours, nephrology recommends comfort measures as he is not a candidate for outpatient dialysis. US renal  Shows Normal renal ultrasound.   Chronic bilateral venous stasis dermatitis Continue with wound care.    Lung nodule noted on the CT scan Recommend outpatient follow-up with PCP    Hypokalemia and hypomagnesemia Replaced   History of coronary artery disease Patient currently denies any chest pain.    DVT prophylaxis: Heparin Code Status: Full code.  Family Communication:  None at bedside.  Disposition:   Status is:  Inpatient  Remains inpatient appropriate because:Ongoing diagnostic testing needed not appropriate for outpatient work up, Unsafe d/c plan, IV treatments appropriate due to intensity of illness or inability to take PO and Inpatient level of care appropriate due to severity of illness   Dispo: The patient is from: Home              Anticipated d/c is to: pending.               Anticipated d/c date is: > 3 days              Patient currently is not medically stable to d/c.   Difficult to place patient No       Level of care: ICU Consultants:   Cardiology.   Nephrology.   Procedures:  Antimicrobials: Antibiotics Given (last 72 hours)    Date/Time Action Medication Dose Rate   04/15/20 1712 New Bag/Given   ceFEPIme (MAXIPIME) 2 g in sodium chloride 0.9 % 100 mL IVPB 2 g 200 mL/hr   04/16/20 0655 New Bag/Given   ceFEPIme (MAXIPIME) 2 g in sodium chloride 0.9 % 100 mL IVPB 2 g 200 mL/hr   04/16/20 1738 New Bag/Given  ceFEPIme (MAXIPIME) 2 g in sodium chloride 0.9 % 100 mL IVPB 2 g 200 mL/hr   04/17/20 0630 New Bag/Given   ceFEPIme (MAXIPIME) 2 g in sodium chloride 0.9 % 100 mL IVPB 2 g 200 mL/hr   04/17/20 1712 New Bag/Given   ceFEPIme (MAXIPIME) 2 g in sodium chloride 0.9 % 100 mL IVPB 2 g 200 mL/hr       Subjective: Loose stools yesterday,   Objective: Vitals:   04/18/20 0355 04/18/20 0400 04/18/20 0500 04/18/20 1114  BP: 128/76   125/70  Pulse:    (!) 108  Resp: (!) 22   (!) 21  Temp: 97.7 F (36.5 C)   98.3 F (36.8 C)  TempSrc: Oral   Oral  SpO2: 94% 94% 93% 97%  Weight:      Height:        Intake/Output Summary (Last 24 hours) at 04/18/2020 1516 Last data filed at 04/18/2020 1311 Gross per 24 hour  Intake 2649.52 ml  Output 325 ml  Net 2324.52 ml   Filed Weights   04/17/20 0358 04/17/20 0902 04/18/20 0349  Weight: 135.7 kg (!) 137.1 kg (!) 139.1 kg    Examination:  General exam: Ill-appearing gentleman on 4 L of nasal cannula oxygen, mild  distress Respiratory system:  diminished air entry at bases,  cardiovascular system: S1-S2 heard, tachycardic, irregularly irregular Gastrointestinal system: Abdomen is soft, nontender, bowel sounds normal  Central nervous system: Alert and oriented, grossly nonfocal Extremities: Lower extremity edema present chronic venous stasis. Skin: Pressure injury on the right buttocks Psychiatry: Mood is appropriate   Data Reviewed: I have personally reviewed following labs and imaging studies  CBC: Recent Labs  Lab 04/14/20 1433 04/15/20 0429 04/16/20 0500 04/17/20 0413 04/17/20 1700 04/18/20 0411  WBC 8.3 8.4 8.3 11.0* 13.3* 11.6*  NEUTROABS 6.5  --   --   --   --   --   HGB 10.0* 8.5* 8.3* 7.7* 8.2* 7.2*  HCT 34.7* 28.1* 28.9* 25.3* 26.6* 23.9*  MCV 82.4 80.1 81.0 79.6* 78.0* 78.4*  PLT 399 341 331 330 419* [id number]    Basic Metabolic Panel: Recent Labs  Lab 04/13/20 0623 04/14/20 0443 04/15/20 0429 04/15/20 0929 04/16/20 0500 04/16/20 1136 04/17/20 0413 04/17/20 1600 04/18/20 0411  NA 132*   < > 129* 127*  --  129* 126* 128* 124*  K 4.2   < > 4.8 4.7  --  4.6 4.3 4.5 4.3  CL 88*   < > 89* 86*  --  89* 87* 88* 83*  CO2 31   < > 29 28  --  '27 27 26 24  '$ GLUCOSE 294*   < > 215* 412*  --  326*  323* 327* 188* 435*  BUN 37*   < > 46* 47*  --  65* 74* 82* 87*  CREATININE 1.86*   < > 2.07* 2.09*  --  2.63* 3.22* 4.02* 4.40*  CALCIUM 8.4*   < > 8.4* 7.9*  --  8.1* 8.1* 8.4* 8.3*  MG 2.3  --  2.4  --  2.6*  --   --   --   --    < > = values in this interval not displayed.    GFR: Estimated Creatinine Clearance: 21.7 mL/min (A) (by C-G formula based on SCr of 4.4 mg/dL (H)).  Liver Function Tests: Recent Labs  Lab 04/14/20 0443 04/15/20 0429  AST 22 28  ALT 18 22  ALKPHOS 61 71  BILITOT 0.8  1.0  PROT 6.9 6.4*  ALBUMIN 2.8* 2.7*    CBG: Recent Labs  Lab 04/17/20 1119 04/17/20 1558 04/17/20 2218 04/18/20 0638 04/18/20 1115  GLUCAP 262* 175* 246* 248* 313*      Recent Results (from the past 240 hour(s))  Culture, blood (routine x 2)     Status: Abnormal   Collection Time: 04/14/20  9:37 AM   Specimen: BLOOD  Result Value Ref Range Status   Specimen Description BLOOD RIGHT ANTECUBITAL  Final   Special Requests   Final    BOTTLES DRAWN AEROBIC AND ANAEROBIC Blood Culture results may not be optimal due to an inadequate volume of blood received in culture bottles Performed at [facility] [address], [place], [place] [address]    Culture  Setup Time   Final    CORRECTED RESULTS GRAM NEGATIVE RODS PREVIOUSLY REPORTED AS: GRAM NEGATIVE COCCI CORRECTED RESULTS CALLED TO: PHARMD LYDIA CHEN 0749 [id number] FCP    Culture SERRATIA MARCESCENS (A)  Final   Report Status 04/17/2020 FINAL  Final   Organism ID, Bacteria SERRATIA MARCESCENS  Final      Susceptibility   Serratia marcescens - MIC*    CEFAZOLIN >=64 RESISTANT Resistant     CEFEPIME <=0.12 SENSITIVE Sensitive     CEFTAZIDIME <=1 SENSITIVE Sensitive     CEFTRIAXONE <=0.25 SENSITIVE Sensitive     CIPROFLOXACIN <=0.25 SENSITIVE Sensitive     GENTAMICIN <=1 SENSITIVE Sensitive     TRIMETH/SULFA <=20 SENSITIVE Sensitive     * SERRATIA MARCESCENS  Blood Culture ID Panel (Reflexed)     Status: Abnormal   Collection Time: 04/14/20  9:37 AM  Result Value Ref Range Status   Enterococcus faecalis NOT DETECTED NOT DETECTED Final   Enterococcus Faecium NOT DETECTED NOT DETECTED Final   Listeria monocytogenes NOT DETECTED NOT DETECTED Final   Staphylococcus species NOT DETECTED NOT DETECTED Final   Staphylococcus aureus (BCID) NOT DETECTED NOT DETECTED Final   Staphylococcus epidermidis NOT DETECTED NOT DETECTED Final   Staphylococcus lugdunensis NOT DETECTED NOT DETECTED Final   Streptococcus species NOT DETECTED NOT DETECTED Final   Streptococcus agalactiae NOT DETECTED NOT DETECTED Final   Streptococcus pneumoniae NOT DETECTED NOT DETECTED Final   Streptococcus pyogenes NOT  DETECTED NOT DETECTED Final   A.calcoaceticus-baumannii NOT DETECTED NOT DETECTED Final   Bacteroides fragilis NOT DETECTED NOT DETECTED Final   Enterobacterales DETECTED (A) NOT DETECTED Final    Comment: Enterobacterales represent a large order of gram negative bacteria, not a single organism. CRITICAL RESULT CALLED TO, READ BACK BY AND VERIFIED WITH: [place] ON  04/15/2020    Enterobacter cloacae complex NOT DETECTED NOT DETECTED Final   Escherichia coli NOT DETECTED NOT DETECTED Final   Klebsiella aerogenes NOT DETECTED NOT DETECTED Final   Klebsiella oxytoca NOT DETECTED NOT DETECTED Final   Klebsiella pneumoniae NOT DETECTED NOT DETECTED Final   Proteus species NOT DETECTED NOT DETECTED Final   Salmonella species NOT DETECTED NOT DETECTED Final   Serratia marcescens DETECTED (A) NOT DETECTED Final    Comment: CRITICAL RESULT CALLED TO, READ BACK BY AND VERIFIED WITH: PHARMD AMEND C. BY MESSAN H. AT 0320 ON  04/15/2020    Haemophilus influenzae NOT DETECTED NOT DETECTED Final   Neisseria meningitidis NOT DETECTED NOT DETECTED Final   Pseudomonas aeruginosa NOT DETECTED NOT DETECTED Final   Stenotrophomonas maltophilia NOT DETECTED NOT DETECTED Final   Candida albicans NOT DETECTED NOT DETECTED Final  Candida auris NOT DETECTED NOT DETECTED Final   Candida glabrata NOT DETECTED NOT DETECTED Final   Candida krusei NOT DETECTED NOT DETECTED Final   Candida parapsilosis NOT DETECTED NOT DETECTED Final   Candida tropicalis NOT DETECTED NOT DETECTED Final   Cryptococcus neoformans/gattii NOT DETECTED NOT DETECTED Final   CTX-M ESBL NOT DETECTED NOT DETECTED Final   Carbapenem resistance IMP NOT DETECTED NOT DETECTED Final   Carbapenem resistance KPC NOT DETECTED NOT DETECTED Final   Carbapenem resistance NDM NOT DETECTED NOT DETECTED Final   Carbapenem resist OXA 48 LIKE NOT DETECTED NOT DETECTED Final   Carbapenem resistance VIM NOT DETECTED NOT DETECTED  Final    Comment: Performed at [facility] [address] [place], [place] [address]  Culture, blood (routine x 2)     Status: Abnormal   Collection Time: 04/14/20  9:42 AM   Specimen: BLOOD RIGHT HAND  Result Value Ref Range Status   Specimen Description BLOOD RIGHT HAND  Final   Special Requests   Final    BOTTLES DRAWN AEROBIC AND ANAEROBIC Blood Culture results may not be optimal due to an inadequate volume of blood received in culture bottles   Culture  Setup Time   Final    CORRECTED RESULTS GRAM NEGATIVE RODS PREVIOUSLY REPORTED AS: GRAM NEGATIVE COCCI CORRECTED RESULTS CALLED TO: PHARMD LYDIA CHEN 0749 [id number] FCP    Culture (A)  Final    SERRATIA MARCESCENS SUSCEPTIBILITIES PERFORMED ON PREVIOUS CULTURE WITHIN THE LAST 5 DAYS. Performed at [facility], [place] [address], [place], [place] [address]    Report Status 04/17/2020 FINAL  Final  Culture, Urine     Status: Abnormal   Collection Time: 04/17/20  1:05 PM   Specimen: Urine, Random  Result Value Ref Range Status   Specimen Description URINE, RANDOM  Final   Special Requests NONE  Final   Culture (A)  Final    <10,000 COLONIES/mL INSIGNIFICANT GROWTH Performed at [facility], [place] [address] [place], [place] [address]    Report Status 04/18/2020 FINAL  Final  Culture, blood (Routine X 2) w Reflex to ID Panel     Status: None (Preliminary result)   Collection Time: 04/17/20  2:16 PM   Specimen: BLOOD RIGHT HAND  Result Value Ref Range Status   Specimen Description BLOOD RIGHT HAND  Final   Special Requests   Final    BOTTLES DRAWN AEROBIC AND ANAEROBIC Blood Culture adequate volume   Culture   Final    NO GROWTH < 24 HOURS Performed at [facility], [place] [address], [place], [place] [address]    Report Status PENDING  Incomplete  Culture, blood (Routine X 2) w Reflex to ID Panel     Status: None (Preliminary result)   Collection Time: 04/17/20  2:17 PM   Specimen: BLOOD  Result Value  Ref Range Status   Specimen Description BLOOD RIGHT ANTECUBITAL  Final   Special Requests   Final    BOTTLES DRAWN AEROBIC AND ANAEROBIC Blood Culture results may not be optimal due to an inadequate volume of blood received in culture bottles   Culture   Final    NO GROWTH < 24 HOURS Performed at [facility], [place] [address] [place], [place] [address]    Report Status PENDING  Incomplete         Radiology Studies: DG Abd 1 View  Result Date: 04/18/2020 CLINICAL DATA:  Abdominal distension.  EXAM: ABDOMEN - 1 VIEW COMPARISON:  None. FINDINGS: The bowel gas pattern is normal. No radio-opaque calculi or other significant radiographic abnormality are seen. IMPRESSION: Negative. Electronically Signed   By: Marijo Conception M.D.   On: 04/18/2020 14:22   US RENAL  Result Date: 04/17/2020 CLINICAL DATA:  AK I EXAM: RENAL / URINARY TRACT ULTRASOUND COMPLETE COMPARISON:  None. FINDINGS: Right Kidney: Renal measurements: 12.4 x 6.5 x 5.2 cm = volume: 217 mL. Echogenicity within normal limits. No mass or hydronephrosis visualized. Left Kidney: Renal measurements: 12.4 x 6.4 x 6.3 cm = volume: 260 mL. Echogenicity within normal limits. No mass or hydronephrosis visualized. Bladder: Bladder is decompressed. Other: None. IMPRESSION: Normal renal ultrasound.  No hydronephrosis. Electronically Signed   By: Dahlia Bailiff MD   On: 04/17/2020 23:40        Scheduled Meds: . sodium chloride   Intravenous Once  . atorvastatin  40 mg Oral Daily  . Chlorhexidine Gluconate Cloth  6 each Topical Daily  . insulin aspart  0-20 Units Subcutaneous TID WC  . insulin aspart  10 Units Subcutaneous TID WC  . insulin glargine  45 Units Subcutaneous BID  . lidocaine (PF)  5 mL Intradermal Once  . metolazone  5 mg Oral Once  . multivitamin with minerals  1 tablet Oral Daily  . sodium chloride flush  3 mL Intravenous Q12H   Continuous Infusions: .  prismasol BGK 4/2.5    .  prismasol BGK 4/2.5    . sodium  chloride Stopped (04/17/20 1931)  . amiodarone 60 mg/hr (04/18/20 1028)  . ceFEPime (MAXIPIME) IV    . DOBUTamine 2.5 mcg/kg/min (04/18/20 1222)  . [START ON 04/23/2020] ferumoxytol    . furosemide    . heparin 10,000 units/ 20 mL infusion syringe    . prismasol BGK 4/2.5       LOS: 15 days       Hosie Poisson, MD [facility]   To contact the attending provider between 7A-7P or the covering provider during after hours 7P-7A, please log into the web site [email or web address] and access using universal [place] password for that web site. If you do not have the password, please call the hospital operator.  04/18/2020, 3:16 PM

## 2020-04-18 NOTE — Progress Notes (Addendum)
Inpatient Diabetes Program Recommendations  AACE/ADA: New Consensus Statement on Inpatient Glycemic Control (2015)  Target Ranges:  Prepandial:   less than 140 mg/dL      Peak postprandial:   less than 180 mg/dL (1-2 hours)      Critically ill patients:  140 - 180 mg/dL   Lab Results  Component Value Date   GLUCAP 248 (H) 04/18/2020   HGBA1C 8.4 (H) 12/10/2019    Review of Glycemic Control Results for DARROLD, TRIPPLETT (MRN [id number]) as of 04/18/2020 08:16  Ref. Range 04/17/2020 11:19 04/17/2020 15:58 04/17/2020 22:18 04/18/2020 06:38  Glucose-Capillary Latest Ref Range: 70 - 99 mg/dL 262 (H) 175 (H) 246 (H) 248 (H)   Diabetes history:Type 2 DM Outpatient Diabetes medications:Novolog 70/30 60 units BID Current orders for Inpatient glycemic control:Lantus 42 units BID, Novolog 8 units TID, Novolog 0-20 units TID  Inpatient Diabetes Program Recommendations:  Consider further increasing Novolog to 10 units TID (assuming pt is consuming >50% of meal) and could add Novolog 0-5 units QHS for bedtime coverage. Thanks, Bronson Curb, MSN, RNC-OB Diabetes Coordinator [phone] (8a-5p)

## 2020-04-18 NOTE — Progress Notes (Addendum)
Bladder irragation completed 100 ML in and 100 ML out. Patient complaint of discomfort during the irration process. Urine color now clear, prior to irrigation urine was tea color.   Bladder scan show 0 ml.  White discharge noted at the insertion area of the catherer. Foley and peri care done.

## 2020-04-18 NOTE — TOC Progression Note (Signed)
Transition of Care [facility]) - Progression Note    Patient Details  Name: Raymond Pratt MRN: [id number] Date of Birth: 1950-11-12  Transition of Care [facility]) CM/SW Contact  Zenon Mayo, RN Phone Number: 04/18/2020, 1:53 PM  Clinical Narrative:    Patient conts on high dose lasix, dobutamine.  Per MD note he is not a candidate for long term HD.  But possibly may require CRRT, patient is agreeable to short term CRRT if needed. Patient is active with Alvis Lemmings for [facility] services.  TOC team will continue to follow for dc needs.    Expected Discharge Plan: [place] Barriers to Discharge: Continued Medical Work up  Expected Discharge Plan and Services Expected Discharge Plan: [place]   Discharge Planning Services: CM Consult Post Acute Care Choice: [place] arrangements for the past 2 months: Single Family Home                   DME Agency: NA       HH Arranged: RN,PT [place] Agency: [place] Date [facility] Agency Contacted: 04/07/20 Time [place]: [phone] Representative spoke with at [place]: [place] (SDOH) Interventions Food Insecurity Interventions: Intervention Not Indicated Transportation Interventions: Intervention Not Indicated  Readmission Risk Interventions Readmission Risk Prevention Plan 04/07/2020 12/12/2019  Transportation Screening Complete Complete  PCP or Specialist Appt within 3-5 Days Complete Complete  HRI or Home Care Consult Complete -  Social Work Consult for [place] Planning/Counseling Complete -  Palliative Care Screening Not Applicable Not Applicable  Medication Review Press photographer) Complete -  Some recent data might be hidden

## 2020-04-18 NOTE — Progress Notes (Signed)
Pt refuses CPAP for the night.  

## 2020-04-19 DIAGNOSIS — N186 End stage renal disease: Secondary | ICD-10-CM | POA: Diagnosis not present

## 2020-04-19 DIAGNOSIS — I4891 Unspecified atrial fibrillation: Secondary | ICD-10-CM | POA: Diagnosis not present

## 2020-04-19 DIAGNOSIS — A4150 Gram-negative sepsis, unspecified: Secondary | ICD-10-CM | POA: Diagnosis not present

## 2020-04-19 DIAGNOSIS — J9601 Acute respiratory failure with hypoxia: Secondary | ICD-10-CM | POA: Diagnosis not present

## 2020-04-19 DIAGNOSIS — I4819 Other persistent atrial fibrillation: Secondary | ICD-10-CM | POA: Diagnosis not present

## 2020-04-19 DIAGNOSIS — I5023 Acute on chronic systolic (congestive) heart failure: Secondary | ICD-10-CM | POA: Diagnosis not present

## 2020-04-19 LAB — CBC
HCT: 26.4 % — ABNORMAL LOW (ref 39.0–52.0)
Hemoglobin: 8 g/dL — ABNORMAL LOW (ref 13.0–17.0)
MCH: 23.8 pg — ABNORMAL LOW (ref 26.0–34.0)
MCHC: 30.3 g/dL (ref 30.0–36.0)
MCV: 78.6 fL — ABNORMAL LOW (ref 80.0–100.0)
Platelets: 377 10*3/uL (ref 150–400)
RBC: 3.36 MIL/uL — ABNORMAL LOW (ref 4.22–5.81)
RDW: 25.2 % — ABNORMAL HIGH (ref 11.5–15.5)
WBC: 11 10*3/uL — ABNORMAL HIGH (ref 4.0–10.5)
nRBC: 0.5 % — ABNORMAL HIGH (ref 0.0–0.2)

## 2020-04-19 LAB — TYPE AND SCREEN
ABO/RH(D): O POS
Antibody Screen: NEGATIVE
Unit division: 0

## 2020-04-19 LAB — BPAM RBC
Blood Product Expiration Date: 202203092359
ISSUE DATE / TIME: 202202111651
Unit Type and Rh: 5100

## 2020-04-19 LAB — RENAL FUNCTION PANEL
Albumin: 2.4 g/dL — ABNORMAL LOW (ref 3.5–5.0)
Albumin: 2.5 g/dL — ABNORMAL LOW (ref 3.5–5.0)
Anion gap: 12 (ref 5–15)
Anion gap: 12 (ref 5–15)
BUN: 63 mg/dL — ABNORMAL HIGH (ref 8–23)
BUN: 49 mg/dL — ABNORMAL HIGH (ref 8–23)
CO2: 24 mmol/L (ref 22–32)
CO2: 25 mmol/L (ref 22–32)
Calcium: 8.2 mg/dL — ABNORMAL LOW (ref 8.9–10.3)
Calcium: 8.2 mg/dL — ABNORMAL LOW (ref 8.9–10.3)
Chloride: 90 mmol/L — ABNORMAL LOW (ref 98–111)
Chloride: 92 mmol/L — ABNORMAL LOW (ref 98–111)
Creatinine, Ser: 3.76 mg/dL — ABNORMAL HIGH (ref 0.61–1.24)
Creatinine, Ser: 3.16 mg/dL — ABNORMAL HIGH (ref 0.61–1.24)
GFR, Estimated: 17 mL/min — ABNORMAL LOW (ref >60)
GFR, Estimated: 21 mL/min — ABNORMAL LOW (ref >60)
Glucose, Bld: 349 mg/dL — ABNORMAL HIGH (ref 70–99)
Glucose, Bld: 300 mg/dL — ABNORMAL HIGH (ref 70–99)
Phosphorus: 3.6 mg/dL (ref 2.5–4.6)
Phosphorus: 3.6 mg/dL (ref 2.5–4.6)
Potassium: 4.5 mmol/L (ref 3.5–5.1)
Potassium: 4.7 mmol/L (ref 3.5–5.1)
Sodium: 126 mmol/L — ABNORMAL LOW (ref 135–145)
Sodium: 129 mmol/L — ABNORMAL LOW (ref 135–145)

## 2020-04-19 LAB — MAGNESIUM: Magnesium: 2.7 mg/dL — ABNORMAL HIGH (ref 1.7–2.4)

## 2020-04-19 LAB — POCT ACTIVATED CLOTTING TIME
Activated Clotting Time: 166 seconds
Activated Clotting Time: 166 seconds
Activated Clotting Time: 166 seconds
Activated Clotting Time: 166 seconds
Activated Clotting Time: 166 seconds
Activated Clotting Time: 172 seconds
Activated Clotting Time: 178 seconds
Activated Clotting Time: 178 seconds
Activated Clotting Time: 166 seconds

## 2020-04-19 LAB — GLUCOSE, CAPILLARY
Glucose-Capillary: 161 mg/dL — ABNORMAL HIGH (ref 70–99)
Glucose-Capillary: 197 mg/dL — ABNORMAL HIGH (ref 70–99)
Glucose-Capillary: 164 mg/dL — ABNORMAL HIGH (ref 70–99)
Glucose-Capillary: 431 mg/dL — ABNORMAL HIGH (ref 70–99)
Glucose-Capillary: 290 mg/dL — ABNORMAL HIGH (ref 70–99)

## 2020-04-19 LAB — APTT
aPTT: 66 seconds — ABNORMAL HIGH (ref 24–36)
aPTT: 61 seconds — ABNORMAL HIGH (ref 24–36)

## 2020-04-19 LAB — HEMOGLOBIN AND HEMATOCRIT, BLOOD
HCT: 25.6 % — ABNORMAL LOW (ref 39.0–52.0)
Hemoglobin: 7.8 g/dL — ABNORMAL LOW (ref 13.0–17.0)

## 2020-04-19 LAB — COOXEMETRY PANEL
Carboxyhemoglobin: 1.4 % (ref 0.5–1.5)
Methemoglobin: 1.1 % (ref 0.0–1.5)
O2 Saturation: 56.1 %
Total hemoglobin: 8.1 g/dL — ABNORMAL LOW (ref 12.0–16.0)

## 2020-04-19 MED ORDER — CLONAZEPAM 0.5 MG PO TBDP
0.5000 mg | ORAL_TABLET | Freq: Two times a day (BID) | ORAL | Status: DC
  Administered 2020-04-19 – 2020-04-20 (×2): 0.5 mg via ORAL
  Filled 2020-04-19 (×2): qty 1

## 2020-04-19 MED ORDER — TRAMADOL HCL 50 MG PO TABS
50.0000 mg | ORAL_TABLET | Freq: Two times a day (BID) | ORAL | Status: DC | PRN
  Administered 2020-04-19 – 2020-04-20 (×2): 50 mg via ORAL
  Filled 2020-04-19 (×2): qty 1

## 2020-04-19 MED ORDER — CLONAZEPAM 0.25 MG PO TBDP
0.2500 mg | ORAL_TABLET | Freq: Once | ORAL | Status: AC
  Administered 2020-04-19: 0.25 mg via ORAL
  Filled 2020-04-19: qty 1

## 2020-04-19 MED ORDER — LIDOCAINE-EPINEPHRINE 1 %-1:100000 IJ SOLN
50.0000 mL | Freq: Once | INTRAMUSCULAR | Status: AC
  Administered 2020-04-19: 2 mL
  Filled 2020-04-19: qty 1

## 2020-04-19 MED ORDER — HEPARIN (PORCINE) 25000 UT/250ML-% IV SOLN
1750.0000 [IU]/h | INTRAVENOUS | Status: DC
  Administered 2020-04-19: 1200 [IU]/h via INTRAVENOUS
  Administered 2020-04-20: 19:00:00 1600 [IU]/h via INTRAVENOUS
  Administered 2020-04-20: 1400 [IU]/h via INTRAVENOUS
  Filled 2020-04-19 (×4): qty 250

## 2020-04-19 MED ORDER — SODIUM CHLORIDE 0.9 % IV SOLN
2.0000 g | Freq: Two times a day (BID) | INTRAVENOUS | Status: DC
  Administered 2020-04-19 – 2020-04-21 (×5): 2 g via INTRAVENOUS
  Filled 2020-04-19 (×5): qty 2

## 2020-04-19 MED ORDER — NOREPINEPHRINE 4 MG/250ML-% IV SOLN: 5.0000 ug/min | INTRAVENOUS | Status: DC

## 2020-04-19 MED ORDER — HYDRALAZINE HCL 20 MG/ML IJ SOLN
10.0000 mg | INTRAMUSCULAR | Status: DC | PRN
  Administered 2020-04-19 – 2020-04-20 (×2): 10 mg via INTRAVENOUS
  Filled 2020-04-19 (×2): qty 1

## 2020-04-19 MED ORDER — MELATONIN 3 MG PO TABS
3.0000 mg | ORAL_TABLET | Freq: Every day | ORAL | Status: DC
  Administered 2020-04-19 – 2020-04-20 (×2): 3 mg via ORAL
  Filled 2020-04-19 (×2): qty 1

## 2020-04-19 NOTE — Progress Notes (Signed)
Pharmacy Antibiotic Note  Raymond Pratt is a 70 y.o. male admitted on 04/06/2020 with shortness of breath.  Pharmacy was consulted for vancomycin and zosyn dosing for suspected bacteremia. BCID shows serratia marcescens bacteremia so ABX changed to cefepime. Renal function worsened and CRRT started overnight. Repeat blood cultures negative.  Plan: Adjust cefepime 2gm IV q12h Will f/u renal function, micro data, and pt's clinical condition  Height: '5\' 7"'$  (170.2 cm) Weight: (!) 140.9 kg (310 lb 10.1 oz) IBW/kg (Calculated) : 66.1  Temp (24hrs), Avg:97.8 F (36.6 C), Min:97.5 F (36.4 C), Max:98.3 F (36.8 C)  Recent Labs  Lab 04/14/20 1433 04/14/20 2002 04/15/20 0429 04/16/20 0500 04/16/20 1136 04/17/20 0413 04/17/20 1600 04/17/20 1700 04/18/20 0411 04/18/20 1621 04/19/20 0422  WBC 8.3  --    < > 8.3  --  11.0*  --  13.3* 11.6*  --  11.0*  CREATININE  --   --    < >  --    < > 3.22* 4.02*  --  4.40* 5.01* 3.76*  LATICACIDVEN 2.2* 1.2  --   --   --   --   --   --   --   --   --    < > = values in this interval not displayed.    Estimated Creatinine Clearance: 25.5 mL/min (A) (by C-G formula based on SCr of 3.76 mg/dL (H)).    Allergies  Allergen Reactions  . Pioglitazone Other (See Comments)    Made the patient retain fluid   Antibiotics this admission: Zosyn 2/7>2/8 Vanc 2/7>2/8 Cefepime 2/8>   Microbiology: 2/7 BCx: serratia marcescens 2/10 BCx: NGTD  Arrie Senate, PharmD, BCPS, [facility] Clinical Pharmacist [phone] Please check AMION for all [place] numbers 04/19/2020
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
## 2020-04-19 NOTE — Progress Notes (Signed)
PROGRESS NOTE    Raymond Pratt  [id number] DOB: 11/06/1950 DOA: 03/30/2020 PCP: Shon Baton, MD    Chief Complaint  Patient presents with  . Shortness of Breath    CHF    Brief Narrative:  70 year old gentleman with prior history of chronic systolic and diastolic heart failure, atrial fibrillation on anticoagulation, hypertension, obstructive sleep apnea on CPAP, diabetes mellitus, coronary artery disease presents to ED with worsening shortness of breath.  On arrival to ED was found to be hypoxic requiring up to 4 L of nasal cannula oxygen.  Chest x-ray showed pulmonary vascular congestion. He was admitted for acute hypoxic respiratory failure secondary to acute on chronic combined CHF. He is currently on 4 lit of [place] oxygen to keep sats greater than 90%. Cardiology on board and started him on IV milrinone.  Hospital course complicated by worsening renal parameters, nephrology consulted, unfortunately he is not a candidate for outpatient dialysis. Pt understands and agreed for short term HD/ CRRT for 48 hours . If no improvement  In renal parameters after CRRT for 48 hours, nephrology recommends comfort measures as he is not a candidate for outpatient dialysis. Pt seen , he appears more alert and comfortable.   Assessment & Plan:   Principal Problem:   Acute exacerbation of CHF (congestive heart failure) (HCC) Active Problems:   Type 2 diabetes mellitus with hyperlipidemia (HCC)   OSA on CPAP   Anticoagulated on Coumadin   HTN (hypertension)   A-fib (HCC)   Anemia   Atrial fibrillation with RVR (HCC)   Pressure injury of skin   Acute respiratory failure with hypoxia (HCC)   Gram-negative sepsis (HCC)   Acute hypoxic respiratory failure secondary to acute on chronic systolic and diastolic heart failure requiring up to 4 L of nasal cannula oxygen to keep sats greater than 90%. Chest x-ray on admission shows pulmonary vascular congestion and perihilar  edema. Echocardiogram shows left ventricular ejection fraction of 45 to 50% with hypokinesis, dilated right ventricle and right atrium, moderate MR moderate to severe TR.  Cardiology on board , started him on IV Milrinone, transition to IV dobutamine along with high-dose IV Lasix, transferred to ICU on to their service for CRRT.   Strict intake and output and daily weights.      Persistent atrial fibrillation Rate not well controlled and currently on IV amiodarone.  Patient will need TEE DCCV once bacteremia/infection clears. Eliquis on hold. Transitioned to heparin.     Sepsis in the setting of Serratia bacteremia Blood cultures positive for GNR. Patient currently on cefepime. ID on board and appreciate recommendations to continue with cefepime for 7-day course.  DC antibiotics after the last dose on 04/20/2020 Repeat BP cultures have been negative so far. Afebrile , persistent leukocytosis.    Essential hypertension BP parameters are optimal.    Anemia of chronic disease from CKD  Hemoglobin around 7.2 today.  Transfused 1 unit of prbc transfusion. Repeat hemoglobin around 8.  Transfuse to keep hemoglobin greater than 7. IV Ferraheme infusion ordered by nephrology.     Hyponatremia  From CKD and fluid overload, improving.     Diabetes mellitus with hyperglycemia Hemoglobin A1c around 12.4. CBG (last 3)  Recent Labs    04/19/20 0415 04/19/20 0750 04/19/20 1128  GLUCAP 161* 197* 164*   Increased lantus from 45 units BID, novolog 10 units TIDAC and SSI.  CBG'S are well controlled today.     Body mass index is 48.65 kg/m. Morbid obesity Poor  prognostic factor   Pressure injury stage II pressure injury present on right buttocks,  present on admission:  Pressure Injury 04/04/20 Heel Left Stage 3 -  Full thickness tissue loss. Subcutaneous fat may be visible but bone, tendon or muscle are NOT exposed. (Active)  04/04/20   Location: Heel  Location Orientation:  Left  Staging: Stage 3 -  Full thickness tissue loss. Subcutaneous fat may be visible but bone, tendon or muscle are NOT exposed.  Wound Description (Comments):   Present on Admission: Yes     Pressure Injury 04/04/20 Left Stage 3 -  Full thickness tissue loss. Subcutaneous fat may be visible but bone, tendon or muscle are NOT exposed. (Active)  04/04/20   Location:   Location Orientation: Left  Staging: Stage 3 -  Full thickness tissue loss. Subcutaneous fat may be visible but bone, tendon or muscle are NOT exposed.  Wound Description (Comments):   Present on Admission: Yes     Pressure Injury 04/04/20 Buttocks Right Stage 2 -  Partial thickness loss of dermis presenting as a shallow open injury with a red, pink wound bed without slough. (Active)  04/04/20   Location: Buttocks  Location Orientation: Right  Staging: Stage 2 -  Partial thickness loss of dermis presenting as a shallow open injury with a red, pink wound bed without slough.  Wound Description (Comments):   Present on Admission: Yes   Wound care consulted and recommendations given.   Acute on chronic stage IIIb CKD Baseline creatinine around 1.7, creatinine increased to greater than 2.6 to 3.22 to 4.02 to 4.4. Holding Aldactone, Entresto at this time.  Nephrology consulted unfortunately he is not a candidate for outpatient dialysis. Pt understands and agreed for short term HD/ CRRT for 48 hours . If no improvement  In renal parameters after CRRT for 48 hours, nephrology recommends comfort measures as he is not a candidate for outpatient dialysis. US renal  Shows Normal renal ultrasound. With CRRT, IV lasix and metolazone creatinine around 3.76. Out put of 3106 ml in the last 24 hours.    Chronic bilateral venous stasis dermatitis Continue with wound care.    Lung nodule noted on the CT scan Recommend outpatient follow-up with PCP    Hypokalemia and hypomagnesemia Replaced   History of coronary artery  disease Patient currently denies any chest pain.    DVT prophylaxis: Heparin Code Status: Full code.  Family Communication:  None at bedside.  Disposition:   Status is: Inpatient  Remains inpatient appropriate because:Ongoing diagnostic testing needed not appropriate for outpatient work up, Unsafe d/c plan, IV treatments appropriate due to intensity of illness or inability to take PO and Inpatient level of care appropriate due to severity of illness   Dispo: The patient is from: Home              Anticipated d/c is to: pending.               Anticipated d/c date is: > 3 days              Patient currently is not medically stable to d/c.   Difficult to place patient No       Level of care: ICU Consultants:   Cardiology.   Nephrology.   Procedures:  Antimicrobials: Antibiotics Given (last 72 hours)    Date/Time Action Medication Dose Rate   04/16/20 1738 New Bag/Given   ceFEPIme (MAXIPIME) 2 g in sodium chloride 0.9 % 100 mL IVPB 2 g  200 mL/hr   04/17/20 0630 New Bag/Given   ceFEPIme (MAXIPIME) 2 g in sodium chloride 0.9 % 100 mL IVPB 2 g 200 mL/hr   04/17/20 1712 New Bag/Given   ceFEPIme (MAXIPIME) 2 g in sodium chloride 0.9 % 100 mL IVPB 2 g 200 mL/hr   04/18/20 1719 New Bag/Given   ceFEPIme (MAXIPIME) 2 g in sodium chloride 0.9 % 100 mL IVPB 2 g 200 mL/hr   04/19/20 0951 New Bag/Given   ceFEPIme (MAXIPIME) 2 g in sodium chloride 0.9 % 100 mL IVPB 2 g 200 mL/hr       Subjective: No BM today. No chest pain or sob. No nausea, or vomiting.   Objective: Vitals:   04/19/20 0900 04/19/20 1000 04/19/20 1107 04/19/20 1201  BP: (!) 129/104 (!) 160/130 (!) 145/37 134/79  Pulse: (!) 102 (!) 113 (!) 112 (!) 101  Resp: (!) 22 (!) 23 (!) 24 (!) 22  Temp:      TempSrc:      SpO2: 96% (!) 88%  97%  Weight:      Height:        Intake/Output Summary (Last 24 hours) at 04/19/2020 1451 Last data filed at 04/19/2020 1400 Gross per 24 hour  Intake 2981.03 ml  Output 5253  ml  Net -2271.97 ml   Filed Weights   04/17/20 0902 04/18/20 0349 04/19/20 0405  Weight: (!) 137.1 kg (!) 139.1 kg (!) 140.9 kg    Examination:  General exam: Ill-appearing gentleman on a syncopal oxygen on nasal cannula oxygen Respiratory system: Tachypnea, diminished air entry at bases cardiovascular system: S1-S2 heard, tachycardic, irregularly irregular Gastrointestinal system: Abdomen is soft, nontender, distended bowel sounds normal Central nervous system: Alert and oriented, grossly nonfocal Extremities: Lower extremity edema present Skin: Pressure injury on the right buttocks Psychiatry: Mood is appropriate   Data Reviewed: I have personally reviewed following labs and imaging studies  CBC: Recent Labs  Lab 04/14/20 1433 04/15/20 0429 04/16/20 0500 04/17/20 0413 04/17/20 1700 04/18/20 0411 04/19/20 0030 04/19/20 0422  WBC 8.3   < > 8.3 11.0* 13.3* 11.6*  --  11.0*  NEUTROABS 6.5  --   --   --   --   --   --   --   HGB 10.0*   < > 8.3* 7.7* 8.2* 7.2* 7.8* 8.0*  HCT 34.7*   < > 28.9* 25.3* 26.6* 23.9* 25.6* 26.4*  MCV 82.4   < > 81.0 79.6* 78.0* 78.4*  --  78.6*  PLT 399   < > 331 330 419* 382  --  377   < > = values in this interval not displayed.    Basic Metabolic Panel: Recent Labs  Lab 04/13/20 0623 04/14/20 0443 04/15/20 0429 04/15/20 0929 04/16/20 0500 04/16/20 1136 04/17/20 0413 04/17/20 1600 04/18/20 0411 04/18/20 1621 04/19/20 0422  NA 132*   < > 129*   < >  --    < > 126* 128* 124* 121* 126*  K 4.2   < > 4.8   < >  --    < > 4.3 4.5 4.3 4.2 4.5  CL 88*   < > 89*   < >  --    < > 87* 88* 83* 83* 90*  CO2 31   < > 29   < >  --    < > '27 26 24 23 24  '$ GLUCOSE 294*   < > 215*   < >  --    < >  327* 188* 435* 488* 349*  BUN 37*   < > 46*   < >  --    < > 74* 82* 87* 87* 63*  CREATININE 1.86*   < > 2.07*   < >  --    < > 3.22* 4.02* 4.40* 5.01* 3.76*  CALCIUM 8.4*   < > 8.4*   < >  --    < > 8.1* 8.4* 8.3* 7.8* 8.2*  MG 2.3  --  2.4  --  2.6*   --   --   --   --   --  2.7*  PHOS  --   --   --   --   --   --   --   --   --  4.1 3.6   < > = values in this interval not displayed.    GFR: Estimated Creatinine Clearance: 25.5 mL/min (A) (by C-G formula based on SCr of 3.76 mg/dL (H)).  Liver Function Tests: Recent Labs  Lab 04/14/20 0443 04/15/20 0429 04/18/20 1621 04/19/20 0422  AST 22 28  --   --   ALT 18 22  --   --   ALKPHOS 61 71  --   --   BILITOT 0.8 1.0  --   --   PROT 6.9 6.4*  --   --   ALBUMIN 2.8* 2.7* 2.2* 2.4*    CBG: Recent Labs  Lab 04/18/20 2030 04/18/20 2208 04/19/20 0415 04/19/20 0750 04/19/20 1128  GLUCAP 180* 171* 161* 197* 164*     Recent Results (from the past 240 hour(s))  Culture, blood (routine x 2)     Status: Abnormal   Collection Time: 04/14/20  9:37 AM   Specimen: BLOOD  Result Value Ref Range Status   Specimen Description BLOOD RIGHT ANTECUBITAL  Final   Special Requests   Final    BOTTLES DRAWN AEROBIC AND ANAEROBIC Blood Culture results may not be optimal due to an inadequate volume of blood received in culture bottles Performed at [facility] [address], [place], [place] [address]    Culture  Setup Time   Final    CORRECTED RESULTS GRAM NEGATIVE RODS PREVIOUSLY REPORTED AS: GRAM NEGATIVE COCCI CORRECTED RESULTS CALLED TO: Justice Britain CHEN [id number] FCP    Culture SERRATIA MARCESCENS (A)  Final   Report Status 04/17/2020 FINAL  Final   Organism ID, Bacteria SERRATIA MARCESCENS  Final      Susceptibility   Serratia marcescens - MIC*    CEFAZOLIN >=64 RESISTANT Resistant     CEFEPIME <=0.12 SENSITIVE Sensitive     CEFTAZIDIME <=1 SENSITIVE Sensitive     CEFTRIAXONE <=0.25 SENSITIVE Sensitive     CIPROFLOXACIN <=0.25 SENSITIVE Sensitive     GENTAMICIN <=1 SENSITIVE Sensitive     TRIMETH/SULFA <=20 SENSITIVE Sensitive     * SERRATIA MARCESCENS  Blood Culture ID Panel (Reflexed)     Status: Abnormal   Collection Time: 04/14/20  9:37 AM  Result Value Ref  Range Status   Enterococcus faecalis NOT DETECTED NOT DETECTED Final   Enterococcus Faecium NOT DETECTED NOT DETECTED Final   Listeria monocytogenes NOT DETECTED NOT DETECTED Final   Staphylococcus species NOT DETECTED NOT DETECTED Final   Staphylococcus aureus (BCID) NOT DETECTED NOT DETECTED Final   Staphylococcus epidermidis NOT DETECTED NOT DETECTED Final   Staphylococcus lugdunensis NOT DETECTED NOT DETECTED Final   Streptococcus species NOT DETECTED NOT DETECTED Final   Streptococcus agalactiae NOT DETECTED  NOT DETECTED Final   Streptococcus pneumoniae NOT DETECTED NOT DETECTED Final   Streptococcus pyogenes NOT DETECTED NOT DETECTED Final   A.calcoaceticus-baumannii NOT DETECTED NOT DETECTED Final   Bacteroides fragilis NOT DETECTED NOT DETECTED Final   Enterobacterales DETECTED (A) NOT DETECTED Final    Comment: Enterobacterales represent a large order of gram negative bacteria, not a single organism. CRITICAL RESULT CALLED TO, READ BACK BY AND VERIFIED WITH: [place] ON  04/15/2020    Enterobacter cloacae complex NOT DETECTED NOT DETECTED Final   Escherichia coli NOT DETECTED NOT DETECTED Final   Klebsiella aerogenes NOT DETECTED NOT DETECTED Final   Klebsiella oxytoca NOT DETECTED NOT DETECTED Final   Klebsiella pneumoniae NOT DETECTED NOT DETECTED Final   Proteus species NOT DETECTED NOT DETECTED Final   Salmonella species NOT DETECTED NOT DETECTED Final   Serratia marcescens DETECTED (A) NOT DETECTED Final    Comment: CRITICAL RESULT CALLED TO, READ BACK BY AND VERIFIED WITH: PHARMD AMEND C. BY MESSAN H. AT 0320 ON  04/15/2020    Haemophilus influenzae NOT DETECTED NOT DETECTED Final   Neisseria meningitidis NOT DETECTED NOT DETECTED Final   Pseudomonas aeruginosa NOT DETECTED NOT DETECTED Final   Stenotrophomonas maltophilia NOT DETECTED NOT DETECTED Final   Candida albicans NOT DETECTED NOT DETECTED Final   Candida auris NOT DETECTED NOT  DETECTED Final   Candida glabrata NOT DETECTED NOT DETECTED Final   Candida krusei NOT DETECTED NOT DETECTED Final   Candida parapsilosis NOT DETECTED NOT DETECTED Final   Candida tropicalis NOT DETECTED NOT DETECTED Final   Cryptococcus neoformans/gattii NOT DETECTED NOT DETECTED Final   CTX-M ESBL NOT DETECTED NOT DETECTED Final   Carbapenem resistance IMP NOT DETECTED NOT DETECTED Final   Carbapenem resistance KPC NOT DETECTED NOT DETECTED Final   Carbapenem resistance NDM NOT DETECTED NOT DETECTED Final   Carbapenem resist OXA 48 LIKE NOT DETECTED NOT DETECTED Final   Carbapenem resistance VIM NOT DETECTED NOT DETECTED Final    Comment: Performed at [facility], [address], [place], [place] [address]  Culture, blood (routine x 2)     Status: Abnormal   Collection Time: 04/14/20  9:42 AM   Specimen: BLOOD RIGHT HAND  Result Value Ref Range Status   Specimen Description BLOOD RIGHT HAND  Final   Special Requests   Final    BOTTLES DRAWN AEROBIC AND ANAEROBIC Blood Culture results may not be optimal due to an inadequate volume of blood received in culture bottles   Culture  Setup Time   Final    CORRECTED RESULTS GRAM NEGATIVE RODS PREVIOUSLY REPORTED AS: GRAM NEGATIVE COCCI CORRECTED RESULTS CALLED TO: PHARMD LYDIA CHEN [id number] FCP    Culture (A)  Final    SERRATIA MARCESCENS SUSCEPTIBILITIES PERFORMED ON PREVIOUS CULTURE WITHIN THE LAST 5 DAYS. Performed at [facility], [place] [address], [place], [place] [address]    Report Status 04/17/2020 FINAL  Final  Culture, Urine     Status: Abnormal   Collection Time: 04/17/20  1:05 PM   Specimen: Urine, Random  Result Value Ref Range Status   Specimen Description URINE, RANDOM  Final   Special Requests NONE  Final   Culture (A)  Final    <10,000 COLONIES/mL INSIGNIFICANT GROWTH Performed at [facility], [place] [address] [place],  [address]    Report Status 04/18/2020 FINAL  Final  Culture, blood  (Routine X 2) w Reflex  to ID Panel     Status: None (Preliminary result)   Collection Time: 04/17/20  2:16 PM   Specimen: BLOOD RIGHT HAND  Result Value Ref Range Status   Specimen Description BLOOD RIGHT HAND  Final   Special Requests   Final    BOTTLES DRAWN AEROBIC AND ANAEROBIC Blood Culture adequate volume   Culture   Final    NO GROWTH < 24 HOURS Performed at [facility], [place] [address] [place], [place] [address]    Report Status PENDING  Incomplete  Culture, blood (Routine X 2) w Reflex to ID Panel     Status: None (Preliminary result)   Collection Time: 04/17/20  2:17 PM   Specimen: BLOOD  Result Value Ref Range Status   Specimen Description BLOOD RIGHT ANTECUBITAL  Final   Special Requests   Final    BOTTLES DRAWN AEROBIC AND ANAEROBIC Blood Culture results may not be optimal due to an inadequate volume of blood received in culture bottles   Culture   Final    NO GROWTH < 24 HOURS Performed at [facility], [place] [address] [place], [place] [address]    Report Status PENDING  Incomplete  MRSA PCR Screening     Status: None   Collection Time: 04/18/20  2:39 PM   Specimen: Nasopharyngeal  Result Value Ref Range Status   MRSA by PCR NEGATIVE NEGATIVE Final    Comment:        The GeneXpert MRSA Assay (FDA approved for NASAL specimens only), is one component of a comprehensive MRSA colonization surveillance program. It is not intended to diagnose MRSA infection nor to guide or monitor treatment for MRSA infections. Performed at [facility], [place] [address], [place], [place] [address]          Radiology Studies: DG Abd 1 View  Result Date: 04/18/2020 CLINICAL DATA:  Abdominal distension. EXAM: ABDOMEN - 1 VIEW COMPARISON:  None. FINDINGS: The bowel gas pattern is normal. No radio-opaque calculi or other significant radiographic abnormality are seen. IMPRESSION: Negative. Electronically Signed   By: Marijo Conception M.D.   On: 04/18/2020 14:22    US RENAL  Result Date: 04/17/2020 CLINICAL DATA:  AK I EXAM: RENAL / URINARY TRACT ULTRASOUND COMPLETE COMPARISON:  None. FINDINGS: Right Kidney: Renal measurements: 12.4 x 6.5 x 5.2 cm = volume: 217 mL. Echogenicity within normal limits. No mass or hydronephrosis visualized. Left Kidney: Renal measurements: 12.4 x 6.4 x 6.3 cm = volume: 260 mL. Echogenicity within normal limits. No mass or hydronephrosis visualized. Bladder: Bladder is decompressed. Other: None. IMPRESSION: Normal renal ultrasound.  No hydronephrosis. Electronically Signed   By: Dahlia Bailiff MD   On: 04/17/2020 23:40   DG CHEST PORT 1 VIEW  Result Date: 04/18/2020 CLINICAL DATA:  Central line placement EXAM: PORTABLE CHEST 1 VIEW COMPARISON:  04/14/2020 FINDINGS: Bilateral interstitial and alveolar airspace opacities. No pleural effusion or pneumothorax. Stable cardiomegaly. Left-sided PICC line with the tip projecting over the SVC. Left jugular central venous catheter with the tip projecting over the SVC. No acute osseous abnormality. IMPRESSION: Bilateral interstitial and alveolar airspace opacities concerning for multilobar pneumonia versus pulmonary edema. Left-sided PICC line with the tip projecting over the SVC. Left jugular central venous catheter with the tip projecting over the SVC. Electronically Signed   By: Kathreen Devoid   On: 04/18/2020 15:53        Scheduled Meds: . sodium chloride   Intravenous Once  .  atorvastatin  40 mg Oral Daily  . Chlorhexidine Gluconate Cloth  6 each Topical Daily  . clonazePAM  0.5 mg Oral BID  . insulin aspart  0-20 Units Subcutaneous TID WC  . insulin aspart  10 Units Subcutaneous TID WC  . insulin glargine  45 Units Subcutaneous BID  . multivitamin with minerals  1 tablet Oral Daily  . sodium chloride flush  3 mL Intravenous Q12H   Continuous Infusions: .  prismasol BGK 4/2.5 500 mL/hr at 04/19/20 1408  .  prismasol BGK 4/2.5 300 mL/hr at 04/19/20 1103  . sodium chloride  Stopped (04/17/20 1931)  . amiodarone 30 mg/hr (04/19/20 1400)  . ceFEPime (MAXIPIME) IV Stopped (04/19/20 1021)  . DOBUTamine 2.5 mcg/kg/min (04/19/20 1400)  . [START ON 04/23/2020] ferumoxytol    . heparin 1,200 Units/hr (04/19/20 1400)  . prismasol BGK 4/2.5 1,500 mL/hr at 04/19/20 1404     LOS: 16 days       Hosie Poisson, MD [facility]   To contact the attending provider between 7A-7P or the covering provider during after hours 7P-7A, please log into the web site [email or web address] and access using universal [place] password for that web site. If you do not have the password, please call the hospital operator.  04/19/2020, 2:51 PM

## 2020-04-19 NOTE — Progress Notes (Signed)
Pt refusing to wear CPAP.

## 2020-04-19 NOTE — Progress Notes (Addendum)
Advanced Heart Failure Rounding Note   Subjective:    2/7 febrile. Bld Cx 2/2 Serratia Marcescens 2/11 moved to ICU for CVVHD   Moved to ICU yesterday for CVVHD. Remains on dobutamine by fluid removal limited by low BP. Weight up another 4 pounds. Co-ox 56%  In AF with RVR despite IV amio. Now on heparin. (was on Eliquis)   Feels itchy. SOB. Anxious.   BP elevated  Objective:   Weight Range:  Vital Signs:   Temp:  [97.5 F (36.4 C)-98.3 F (36.8 C)] 97.5 F (36.4 C) (02/12 0400) Pulse Rate:  [97-127] 100 (02/12 0700) Resp:  [19-36] 23 (02/12 0700) BP: (84-139)/(35-123) 112/55 (02/12 0700) SpO2:  [86 %-100 %] 95 % (02/12 0700) Weight:  [140.9 kg] 140.9 kg (02/12 0405) Last BM Date: 04/17/20  Weight change: Filed Weights   04/17/20 0902 04/18/20 0349 04/19/20 0405  Weight: (!) 137.1 kg (!) 139.1 kg (!) 140.9 kg    Intake/Output:   Intake/Output Summary (Last 24 hours) at 04/19/2020 0850 Last data filed at 04/19/2020 0800 Gross per 24 hour  Intake 2765.99 ml  Output 3592 ml  Net -826.01 ml    PHYSICAL EXAM: CVP 20 General:  IIl appearing. Sitting up in bed HEENT: normal Neck: supple. LIJ trialysis . Carotids 2+ bilat; no bruits. No lymphadenopathy or thryomegaly appreciated. Cor: PMI nondisplaced. Irregular tachy Lungs: + crackles Abdomen: obese soft, nontender, + distended.  Extremities: no cyanosis, clubbing, rash, 3+ edema Neuro: alert & orientedx3, cranial nerves grossly intact. moves all 4 extremities w/o difficulty. Affect anxious  Telemetry:  Afib 100-110s. Personally reviewed   Labs: Basic Metabolic Panel: Recent Labs  Lab 04/13/20 0623 04/14/20 0443 04/15/20 0429 04/15/20 0929 04/16/20 0500 04/16/20 1136 04/17/20 0413 04/17/20 1600 04/18/20 0411 04/18/20 1621 04/19/20 0422  NA 132*   < > 129*   < >  --    < > 126* 128* 124* 121* 126*  K 4.2   < > 4.8   < >  --    < > 4.3 4.5 4.3 4.2 4.5  CL 88*   < > 89*   < >  --    < > 87*  88* 83* 83* 90*  CO2 31   < > 29   < >  --    < > '27 26 24 23 24  '$ GLUCOSE 294*   < > 215*   < >  --    < > 327* 188* 435* 488* 349*  BUN 37*   < > 46*   < >  --    < > 74* 82* 87* 87* 63*  CREATININE 1.86*   < > 2.07*   < >  --    < > 3.22* 4.02* 4.40* 5.01* 3.76*  CALCIUM 8.4*   < > 8.4*   < >  --    < > 8.1* 8.4* 8.3* 7.8* 8.2*  MG 2.3  --  2.4  --  2.6*  --   --   --   --   --  2.7*  PHOS  --   --   --   --   --   --   --   --   --  4.1 3.6   < > = values in this interval not displayed.    Liver Function Tests: Recent Labs  Lab 04/14/20 0443 04/15/20 0429 04/18/20 1621 04/19/20 0422  AST 22 28  --   --  ALT 18 22  --   --   ALKPHOS 61 71  --   --   BILITOT 0.8 1.0  --   --   PROT 6.9 6.4*  --   --   ALBUMIN 2.8* 2.7* 2.2* 2.4*   No results for input(s): LIPASE, AMYLASE in the last 168 hours. No results for input(s): AMMONIA in the last 168 hours.  CBC: Recent Labs  Lab 04/14/20 1433 04/15/20 0429 04/16/20 0500 04/17/20 0413 04/17/20 1700 04/18/20 0411 04/19/20 0030 04/19/20 0422  WBC 8.3   < > 8.3 11.0* 13.3* 11.6*  --  11.0*  NEUTROABS 6.5  --   --   --   --   --   --   --   HGB 10.0*   < > 8.3* 7.7* 8.2* 7.2* 7.8* 8.0*  HCT 34.7*   < > 28.9* 25.3* 26.6* 23.9* 25.6* 26.4*  MCV 82.4   < > 81.0 79.6* 78.0* 78.4*  --  78.6*  PLT 399   < > 331 330 419* 382  --  377   < > = values in this interval not displayed.    Cardiac Enzymes: No results for input(s): CKTOTAL, CKMB, CKMBINDEX, TROPONINI in the last 168 hours.  BNP: BNP (last 3 results) Recent Labs    12/10/19 0057 02/13/20 0807 03/21/2020 2019  BNP 154.7* 111.6* 195.0*    ProBNP (last 3 results) No results for input(s): PROBNP in the last 8760 hours.    Other results:  Imaging: DG Abd 1 View  Result Date: 04/18/2020 CLINICAL DATA:  Abdominal distension. EXAM: ABDOMEN - 1 VIEW COMPARISON:  None. FINDINGS: The bowel gas pattern is normal. No radio-opaque calculi or other significant radiographic  abnormality are seen. IMPRESSION: Negative. Electronically Signed   By: Marijo Conception M.D.   On: 04/18/2020 14:22   US RENAL  Result Date: 04/17/2020 CLINICAL DATA:  AK I EXAM: RENAL / URINARY TRACT ULTRASOUND COMPLETE COMPARISON:  None. FINDINGS: Right Kidney: Renal measurements: 12.4 x 6.5 x 5.2 cm = volume: 217 mL. Echogenicity within normal limits. No mass or hydronephrosis visualized. Left Kidney: Renal measurements: 12.4 x 6.4 x 6.3 cm = volume: 260 mL. Echogenicity within normal limits. No mass or hydronephrosis visualized. Bladder: Bladder is decompressed. Other: None. IMPRESSION: Normal renal ultrasound.  No hydronephrosis. Electronically Signed   By: Dahlia Bailiff MD   On: 04/17/2020 23:40   DG CHEST PORT 1 VIEW  Result Date: 04/18/2020 CLINICAL DATA:  Central line placement EXAM: PORTABLE CHEST 1 VIEW COMPARISON:  04/14/2020 FINDINGS: Bilateral interstitial and alveolar airspace opacities. No pleural effusion or pneumothorax. Stable cardiomegaly. Left-sided PICC line with the tip projecting over the SVC. Left jugular central venous catheter with the tip projecting over the SVC. No acute osseous abnormality. IMPRESSION: Bilateral interstitial and alveolar airspace opacities concerning for multilobar pneumonia versus pulmonary edema. Left-sided PICC line with the tip projecting over the SVC. Left jugular central venous catheter with the tip projecting over the SVC. Electronically Signed   By: Kathreen Devoid   On: 04/18/2020 15:53     Medications:     Scheduled Medications: . sodium chloride   Intravenous Once  . atorvastatin  40 mg Oral Daily  . Chlorhexidine Gluconate Cloth  6 each Topical Daily  . clonazePAM  0.25 mg Oral BID  . insulin aspart  0-20 Units Subcutaneous TID WC  . insulin aspart  10 Units Subcutaneous TID WC  . insulin glargine  45 Units Subcutaneous BID  .  multivitamin with minerals  1 tablet Oral Daily  . sodium chloride flush  3 mL Intravenous Q12H     Infusions: .  prismasol BGK 4/2.5 500 mL/hr at 04/18/20 2100  .  prismasol BGK 4/2.5 300 mL/hr at 04/18/20 1728  . sodium chloride Stopped (04/17/20 1931)  . amiodarone 60 mg/hr (04/19/20 0800)  . ceFEPime (MAXIPIME) IV Stopped (04/18/20 1749)  . DOBUTamine 2.5 mcg/kg/min (04/19/20 0800)  . [START ON 04/23/2020] ferumoxytol    . heparin 10,000 units/ 20 mL infusion syringe 1,200 Units/hr (04/19/20 0808)  . prismasol BGK 4/2.5 1,500 mL/hr at 04/18/20 1728    PRN Medications: sodium chloride, acetaminophen **OR** acetaminophen, alum & mag hydroxide-simeth, [COMPLETED] diphenhydrAMINE **FOLLOWED BY** diphenhydrAMINE, heparin, heparin, hydrocortisone cream, hydrOXYzine, loperamide, midazolam, Muscle Rub, ondansetron (ZOFRAN) IV, polyethylene glycol, sodium chloride flush   Assessment/Plan:   1.  Acute on chronic systolic heart failure - Echo (11/2018) EF 25-30%, trace MR, trivial TR, moderately reduced RSVF.   - Cath (12/2018) RA 10, RV 55/8, PA 54/15 (27), PCW = 16, Fick CO/CI = 7.0/2.8 - Echo (04/04/2020) EF 45-50%, mild LVH, hypokinesis LV, dilated LA, dilated RV/RA, mod MR, mod-severe TR, RVSP > 60 mmHg.  - Milrinone started on 1/29 for low CO-OX. Switched to Terminous on 2/11 - Todays CO-OX 56% on dobutamine 2.5 - Massively volume overloaded. CVVHD started 2/11 - Increase fluid removal at 200/hr. D/w Renal  - Holding Entresto and dig for now.  - Hold spiro for now given AKI.  - PRN hydral for HTN   2. Persistent AF - has been on amio at home - suspect has been in AF for several weeks. ECG in 12/21 was NSR - Last week he went back in NSR but over the weekend back in A fib RVR.  - Will need cardioversion, once infection clears.  - Continue IV amio  - Eliquis switched to heparin 2/11  3. Gram-negative (serratia marcescens) sepsis - 2/7 Fever. Lactic Acid 2.2>1.2  - 2/7Bld Cx 2/2-Serratia Marcescens - On cefepime. ID following  4. Acute on chronic hypoxic respiratory failure/OSA   - needs volume removal - can use CPAP as needed - He is DNR  5. AKI on CKD, Stage 3b -> ESRD - Baseline Cr 1.5-1.7 - SCr 1.7 >2.1>>2.6>>3.22>>4.4> CVVHD > 3.7 - Renal u/s no acute findings.  - Change milrinone for DBA and increase lasix 160 Q6H; patient does not want HD/CVVD at this point will continue to evaluated.  - CVVHD started 2/11 - not candidate for outpatient HD  6. Venous stasis wounds L tib/fib, L heel & Sacral wound - Wound care consulted. Primary team managing   7. T2DM - A1c 12.4% recent hospitalization - SSI Per primary team - Would be concerned about SGT2i with mild erythema under pannus  8. Morbidly obese Body mass index is 48.65 kg/m.  9. Lung nodule - Will need outpatient imaging in 3 months, 9 mm CT (03/2020)  10. Hypokalemia/ Hypomagnesemia -K /Mag stable.  - Keep K> 4.0 Mg > 2.0   11. Hypervolemic Hyponatremia - Corrected Sodium 129. Restrict free water.   - Will correct with CVVHD  12. Anemia/Hx of GIB - Hgb down 7.2 but ? Dilutional. No obvious source of bleeding  - EGD 12/2019 nonbleeding duodenal ulcer with gastritis - Received Feraheme 04/09/20  - monitor closely, defer transfusion to IM   CRITICAL CARE Performed by: [name]  Total critical care time: 35 minutes  Critical care time was exclusive of separately billable procedures  and treating other patients.  Critical care was necessary to treat or prevent imminent or life-threatening deterioration.  Critical care was time spent personally by me (independent of midlevel providers or residents) on the following activities: development of treatment plan with patient and/or surrogate as well as nursing, discussions with consultants, evaluation of patient's response to treatment, examination of patient, obtaining history from patient or surrogate, ordering and performing treatments and interventions, ordering and review of laboratory studies, ordering and review of radiographic  studies, pulse oximetry and re-evaluation of patient's condition.    Length of Stay: 16   [name] MD 04/19/2020, 8:50 AM  Advanced Heart Failure Team Pager [phone] (M-F; 7a - 4p)  Please contact [facility] for night-coverage after hours (4p -7a ) and weekends on [email or web address]

## 2020-04-19 NOTE — Progress Notes (Signed)
   04/18/20 1843  Clinical Encounter Type  Visited With Patient  Visit Type Spiritual support;Initial  Referral From Nurse  Consult/Referral To Chaplain  Spiritual Encounters  Spiritual Needs Emotional;Prayer;Sacred text  Stress Factors  Patient Stress Factors Health changes;Major life changes   Chaplain responded to page for prayer. Pt was emotional given news about his health that he had just received. Pt requested chaplain read him passages from the Bible as he is unable to read them himself. Chaplain read him Scripture and prayed with Pt. Chaplain engaged in active listening and provided emotional and spiritual support. Pt's faith is a source of comfort. Pt mentioned he had left voicemails for his family and pastor but they had yet to respond. Pt requested follow-up when able. This chaplain will inform the next on-call chaplain if unable to follow-up tonight. Chaplain remains available.   This note was prepared by Chaplain Resident, Dante Gang, MDiv. Chaplain remains available as needed through the on-call pager: [phone].

## 2020-04-19 NOTE — Plan of Care (Signed)
  Problem: Clinical Measurements: Goal: Cardiovascular complication will be avoided Outcome: Progressing   Problem: Nutrition: Goal: Adequate nutrition will be maintained Outcome: Progressing   Problem: Cardiac: Goal: Ability to achieve and maintain adequate cardiopulmonary perfusion will improve Outcome: Progressing   Problem: Coping: Goal: Level of anxiety will decrease Outcome: Not Progressing Note: Despite increase in klonopin, use of music, and use of PRN versed, pt remains with high level of anxiety and extremely restless   Problem: Education: Goal: Ability to verbalize understanding of medication therapies will improve Outcome: Not Progressing

## 2020-04-19 NOTE — Progress Notes (Addendum)
[facility] NEPHROLOGY PROGRESS NOTE  Assessment/ Plan: Pt is a 70 y.o. yo male  with HFrEF (9/20 25-30%, 03/2020 45-50%), atrial fibrillation,  OSA, DM type 2, obesity BMI > 45, HTN who is admitted since 1/27 with dyspnea/volume overload who seen for evaluation and management of AKI.    #Acute on chronic systolic CHF: Decompensated heart failure. Volume management via CRRT.  On dobutamine for inotropic support.  Discussed with cardiologist.  #Acute kidney injury on CKD 3 due to cardiorenal syndrome/bacteremia.  Refractory to diuretics with inotropic support.  He is massively fluid overload.  Initiated CRRT on 2/11 for ultrafiltration.  May need pressors to help with ultrafiltration.  Currently 150 cc an hour.  Trying CRRT for short-term.  If no improvement then he is not a candidate for outpatient dialysis.  Patient understands this.  Continue all 4K bath, IV heparin.  Discontinue Lasix as it is not helping.   #Serratia bacteremia: Currently on cefepime.  ID following.  #Hyponatremia, hypervolemic: Now managed with CRRT.  Monitor lab.  #A. fib with RVR: Tachycardic.  Currently on amiodarone IV and Eliquis.  #Anemia of critical illness/renal failure: Received iron.  ESA on 2/11.  Monitor hemoglobin and transfuse as needed.  Subjective: Seen and examined at bedside.  Transferred to ICU for CVVH.  Left IJ HD catheter placed by cardiologist.  Tolerating CRRT well.  Still having shortness of breath and volume up.  Urine output only 275 cc.  Discussed with ICU team and cardiology. Objective Vital signs in last 24 hours: Vitals:   04/19/20 0405 04/19/20 0500 04/19/20 0600 04/19/20 0700  BP:  110/77 (!) 139/123 (!) 112/55  Pulse:  (!) 106 (!) 103 100  Resp:  (!) 24 (!) 22 (!) 23  Temp:      TempSrc:      SpO2:  93% 98% 95%  Weight: (!) 140.9 kg     Height:       Weight change: 3.823 kg  Intake/Output Summary (Last 24 hours) at 04/19/2020 0808 Last data filed at 04/19/2020  0800 Gross per 24 hour  Intake 3005.99 ml  Output 3592 ml  Net -586.01 ml       Labs: Basic Metabolic Panel: Recent Labs  Lab 04/18/20 0411 04/18/20 1621 04/19/20 0422  NA 124* 121* 126*  K 4.3 4.2 4.5  CL 83* 83* 90*  CO2 '24 23 24  '$ GLUCOSE 435* 488* 349*  BUN 87* 87* 63*  CREATININE 4.40* 5.01* 3.76*  CALCIUM 8.3* 7.8* 8.2*  PHOS  --  4.1 3.6   Liver Function Tests: Recent Labs  Lab 04/14/20 0443 04/15/20 0429 04/18/20 1621 04/19/20 0422  AST 22 28  --   --   ALT 18 22  --   --   ALKPHOS 61 71  --   --   BILITOT 0.8 1.0  --   --   PROT 6.9 6.4*  --   --   ALBUMIN 2.8* 2.7* 2.2* 2.4*   No results for input(s): LIPASE, AMYLASE in the last 168 hours. No results for input(s): AMMONIA in the last 168 hours. CBC: Recent Labs  Lab 04/14/20 1433 04/15/20 0429 04/16/20 0500 04/17/20 0413 04/17/20 1700 04/18/20 0411 04/19/20 0030 04/19/20 0422  WBC 8.3   < > 8.3 11.0* 13.3* 11.6*  --  11.0*  NEUTROABS 6.5  --   --   --   --   --   --   --   HGB 10.0*   < >  8.3* 7.7* 8.2* 7.2* 7.8* 8.0*  HCT 34.7*   < > 28.9* 25.3* 26.6* 23.9* 25.6* 26.4*  MCV 82.4   < > 81.0 79.6* 78.0* 78.4*  --  78.6*  PLT 399   < > 331 330 419* 382  --  377   < > = values in this interval not displayed.   Cardiac Enzymes: No results for input(s): CKTOTAL, CKMB, CKMBINDEX, TROPONINI in the last 168 hours. CBG: Recent Labs  Lab 04/18/20 1558 04/18/20 2030 04/18/20 2208 04/19/20 0415 04/19/20 0750  GLUCAP 239* 180* 171* 161* 197*    Iron Studies: No results for input(s): IRON, TIBC, TRANSFERRIN, FERRITIN in the last 72 hours. Studies/Results: DG Abd 1 View  Result Date: 04/18/2020 CLINICAL DATA:  Abdominal distension. EXAM: ABDOMEN - 1 VIEW COMPARISON:  None. FINDINGS: The bowel gas pattern is normal. No radio-opaque calculi or other significant radiographic abnormality are seen. IMPRESSION: Negative. Electronically Signed   By: Marijo Conception M.D.   On: 04/18/2020 14:22   US  RENAL  Result Date: 04/17/2020 CLINICAL DATA:  AK I EXAM: RENAL / URINARY TRACT ULTRASOUND COMPLETE COMPARISON:  None. FINDINGS: Right Kidney: Renal measurements: 12.4 x 6.5 x 5.2 cm = volume: 217 mL. Echogenicity within normal limits. No mass or hydronephrosis visualized. Left Kidney: Renal measurements: 12.4 x 6.4 x 6.3 cm = volume: 260 mL. Echogenicity within normal limits. No mass or hydronephrosis visualized. Bladder: Bladder is decompressed. Other: None. IMPRESSION: Normal renal ultrasound.  No hydronephrosis. Electronically Signed   By: Dahlia Bailiff MD   On: 04/17/2020 23:40   DG CHEST PORT 1 VIEW  Result Date: 04/18/2020 CLINICAL DATA:  Central line placement EXAM: PORTABLE CHEST 1 VIEW COMPARISON:  04/14/2020 FINDINGS: Bilateral interstitial and alveolar airspace opacities. No pleural effusion or pneumothorax. Stable cardiomegaly. Left-sided PICC line with the tip projecting over the SVC. Left jugular central venous catheter with the tip projecting over the SVC. No acute osseous abnormality. IMPRESSION: Bilateral interstitial and alveolar airspace opacities concerning for multilobar pneumonia versus pulmonary edema. Left-sided PICC line with the tip projecting over the SVC. Left jugular central venous catheter with the tip projecting over the SVC. Electronically Signed   By: Kathreen Devoid   On: 04/18/2020 15:53    Medications: Infusions: .  prismasol BGK 4/2.5 500 mL/hr at 04/18/20 2100  .  prismasol BGK 4/2.5 300 mL/hr at 04/18/20 1728  . sodium chloride Stopped (04/17/20 1931)  . amiodarone 60 mg/hr (04/19/20 0800)  . ceFEPime (MAXIPIME) IV Stopped (04/18/20 1749)  . DOBUTamine 2.5 mcg/kg/min (04/19/20 0800)  . [START ON 04/23/2020] ferumoxytol    . furosemide Stopped (04/19/20 0336)  . heparin 10,000 units/ 20 mL infusion syringe 1,150 Units/hr (04/19/20 0701)  . prismasol BGK 4/2.5 1,500 mL/hr at 04/18/20 1728    Scheduled Medications: . sodium chloride   Intravenous Once  .  atorvastatin  40 mg Oral Daily  . Chlorhexidine Gluconate Cloth  6 each Topical Daily  . clonazePAM  0.25 mg Oral BID  . insulin aspart  0-20 Units Subcutaneous TID WC  . insulin aspart  10 Units Subcutaneous TID WC  . insulin glargine  45 Units Subcutaneous BID  . multivitamin with minerals  1 tablet Oral Daily  . sodium chloride flush  3 mL Intravenous Q12H    have reviewed scheduled and prn medications.  Physical Exam: General: Sitting on bed, not in distress Heart: Tachycardic, s1s2 nl Lungs: Coarse crackles bilateral, no increased work of breathing Abdomen: Distended abdomen  and abdomen wall edema present Extremities: Lower extremities edema present. Neurology: Alert awake and following commands, no asterixis  Jakaylee Sasaki Tanna Furry 04/19/2020,8:08 AM  LOS: 16 days

## 2020-04-19 NOTE — Progress Notes (Signed)
[place] for heparin Indication: atrial fibrillation  Allergies  Allergen Reactions  . Pioglitazone Other (See Comments)    Made the patient retain fluid    Patient Measurements: Height: '5\' 7"'$  (170.2 cm) Weight: (!) 140.9 kg (310 lb 10.1 oz) IBW/kg (Calculated) : 66.1 Heparin Dosing Weight: 102kg  Vital Signs: BP: 106/79 (02/12 1900) Pulse Rate: 101 (02/12 1900)  Labs: Recent Labs    04/17/20 1700 04/18/20 0411 04/18/20 1621 04/19/20 0030 04/19/20 0422 04/19/20 1535 04/19/20 1818  HGB 8.2* 7.2*  --  7.8* 8.0*  --   --   HCT 26.6* 23.9*  --  25.6* 26.4*  --   --   PLT 419* 382  --   --  377  --   --   APTT  --   --   --   --  66*  --  61*  CREATININE  --  4.40* 5.01*  --  3.76* 3.16*  --     Estimated Creatinine Clearance: 30.4 mL/min (A) (by C-G formula based on SCr of 3.16 mg/dL (H)).   Assessment: 16 yoM with acute CHF and AFib previously on warfarin but changed to apixaban this admission. Pt now on CRRT and in ICU, changing anticoagulation to heparin in case further procedures are needed.   APTT is slightly sub-therapeutic at 61 sec.  No bleeding reported.  Goal of Therapy:  Heparin level 0.3-0.7 units/ml aPTT 66-102 seconds Monitor platelets by anticoagulation protocol: Yes   Plan:  Increase heparin gtt to 1400 units/hr F/U AM labs  Raymond Pratt, PharmD, BCPS, [place] 04/19/2020, 7:14 PM

## 2020-04-20 DIAGNOSIS — I5023 Acute on chronic systolic (congestive) heart failure: Secondary | ICD-10-CM | POA: Diagnosis not present

## 2020-04-20 DIAGNOSIS — A4150 Gram-negative sepsis, unspecified: Secondary | ICD-10-CM | POA: Diagnosis not present

## 2020-04-20 DIAGNOSIS — I4891 Unspecified atrial fibrillation: Secondary | ICD-10-CM | POA: Diagnosis not present

## 2020-04-20 DIAGNOSIS — R570 Cardiogenic shock: Secondary | ICD-10-CM

## 2020-04-20 DIAGNOSIS — N179 Acute kidney failure, unspecified: Secondary | ICD-10-CM | POA: Diagnosis not present

## 2020-04-20 DIAGNOSIS — Z515 Encounter for palliative care: Secondary | ICD-10-CM

## 2020-04-20 DIAGNOSIS — J9601 Acute respiratory failure with hypoxia: Secondary | ICD-10-CM | POA: Diagnosis not present

## 2020-04-20 DIAGNOSIS — N183 Chronic kidney disease, stage 3 unspecified: Secondary | ICD-10-CM

## 2020-04-20 DIAGNOSIS — I4819 Other persistent atrial fibrillation: Secondary | ICD-10-CM | POA: Diagnosis not present

## 2020-04-20 LAB — POCT I-STAT 7, (LYTES, BLD GAS, ICA,H+H)
Acid-base deficit: 1 mmol/L (ref 0.0–2.0)
Acid-base deficit: 1 mmol/L (ref 0.0–2.0)
Bicarbonate: 27.5 mmol/L (ref 20.0–28.0)
Bicarbonate: 27.9 mmol/L (ref 20.0–28.0)
Calcium, Ion: 1.22 mmol/L (ref 1.15–1.40)
Calcium, Ion: 1.24 mmol/L (ref 1.15–1.40)
HCT: 31 % — ABNORMAL LOW (ref 39.0–52.0)
HCT: 32 % — ABNORMAL LOW (ref 39.0–52.0)
Hemoglobin: 10.5 g/dL — ABNORMAL LOW (ref 13.0–17.0)
Hemoglobin: 10.9 g/dL — ABNORMAL LOW (ref 13.0–17.0)
O2 Saturation: 90 %
O2 Saturation: 98 %
Patient temperature: 97.5
Patient temperature: 97.5
Potassium: 4.9 mmol/L (ref 3.5–5.1)
Potassium: 5.3 mmol/L — ABNORMAL HIGH (ref 3.5–5.1)
Sodium: 133 mmol/L — ABNORMAL LOW (ref 135–145)
Sodium: 133 mmol/L — ABNORMAL LOW (ref 135–145)
TCO2: 30 mmol/L (ref 22–32)
TCO2: 30 mmol/L (ref 22–32)
pCO2 arterial: 66.5 mmHg (ref 32.0–48.0)
pCO2 arterial: 68.8 mmHg (ref 32.0–48.0)
pH, Arterial: 7.22 — ABNORMAL LOW (ref 7.350–7.450)
pH, Arterial: 7.212 — ABNORMAL LOW (ref 7.350–7.450)
pO2, Arterial: 69 mmHg — ABNORMAL LOW (ref 83.0–108.0)
pO2, Arterial: 130 mmHg — ABNORMAL HIGH (ref 83.0–108.0)

## 2020-04-20 LAB — HEMOGLOBIN A1C
Hgb A1c MFr Bld: 8.1 % — ABNORMAL HIGH (ref 4.8–5.6)
Mean Plasma Glucose: 185.77 mg/dL

## 2020-04-20 LAB — RENAL FUNCTION PANEL
Albumin: 2.6 g/dL — ABNORMAL LOW (ref 3.5–5.0)
Albumin: 2.6 g/dL — ABNORMAL LOW (ref 3.5–5.0)
Anion gap: 11 (ref 5–15)
Anion gap: 10 (ref 5–15)
BUN: 37 mg/dL — ABNORMAL HIGH (ref 8–23)
BUN: 29 mg/dL — ABNORMAL HIGH (ref 8–23)
CO2: 26 mmol/L (ref 22–32)
CO2: 24 mmol/L (ref 22–32)
Calcium: 8.7 mg/dL — ABNORMAL LOW (ref 8.9–10.3)
Calcium: 8.3 mg/dL — ABNORMAL LOW (ref 8.9–10.3)
Chloride: 91 mmol/L — ABNORMAL LOW (ref 98–111)
Chloride: 96 mmol/L — ABNORMAL LOW (ref 98–111)
Creatinine, Ser: 2.63 mg/dL — ABNORMAL HIGH (ref 0.61–1.24)
Creatinine, Ser: 2.3 mg/dL — ABNORMAL HIGH (ref 0.61–1.24)
GFR, Estimated: 26 mL/min — ABNORMAL LOW (ref >60)
GFR, Estimated: 30 mL/min — ABNORMAL LOW (ref >60)
Glucose, Bld: 271 mg/dL — ABNORMAL HIGH (ref 70–99)
Glucose, Bld: 252 mg/dL — ABNORMAL HIGH (ref 70–99)
Phosphorus: 3.9 mg/dL (ref 2.5–4.6)
Phosphorus: 3.9 mg/dL (ref 2.5–4.6)
Potassium: 5 mmol/L (ref 3.5–5.1)
Potassium: 5 mmol/L (ref 3.5–5.1)
Sodium: 128 mmol/L — ABNORMAL LOW (ref 135–145)
Sodium: 130 mmol/L — ABNORMAL LOW (ref 135–145)

## 2020-04-20 LAB — CBC
HCT: 28.5 % — ABNORMAL LOW (ref 39.0–52.0)
Hemoglobin: 8.5 g/dL — ABNORMAL LOW (ref 13.0–17.0)
MCH: 24.2 pg — ABNORMAL LOW (ref 26.0–34.0)
MCHC: 29.8 g/dL — ABNORMAL LOW (ref 30.0–36.0)
MCV: 81.2 fL (ref 80.0–100.0)
Platelets: 393 10*3/uL (ref 150–400)
RBC: 3.51 MIL/uL — ABNORMAL LOW (ref 4.22–5.81)
RDW: 26.5 % — ABNORMAL HIGH (ref 11.5–15.5)
WBC: 12 10*3/uL — ABNORMAL HIGH (ref 4.0–10.5)
nRBC: 0.5 % — ABNORMAL HIGH (ref 0.0–0.2)

## 2020-04-20 LAB — COOXEMETRY PANEL
Carboxyhemoglobin: 1.6 % — ABNORMAL HIGH (ref 0.5–1.5)
Carboxyhemoglobin: 1.1 % (ref 0.5–1.5)
Methemoglobin: 1.1 % (ref 0.0–1.5)
Methemoglobin: 0.9 % (ref 0.0–1.5)
O2 Saturation: 51.3 %
O2 Saturation: 63.2 %
Total hemoglobin: 6.6 g/dL — CL (ref 12.0–16.0)
Total hemoglobin: 8.6 g/dL — ABNORMAL LOW (ref 12.0–16.0)

## 2020-04-20 LAB — APTT: aPTT: 62 seconds — ABNORMAL HIGH (ref 24–36)

## 2020-04-20 LAB — GLUCOSE, CAPILLARY
Glucose-Capillary: 104 mg/dL — ABNORMAL HIGH (ref 70–99)
Glucose-Capillary: 217 mg/dL — ABNORMAL HIGH (ref 70–99)
Glucose-Capillary: 105 mg/dL — ABNORMAL HIGH (ref 70–99)
Glucose-Capillary: 100 mg/dL — ABNORMAL HIGH (ref 70–99)
Glucose-Capillary: 201 mg/dL — ABNORMAL HIGH (ref 70–99)
Glucose-Capillary: 210 mg/dL — ABNORMAL HIGH (ref 70–99)

## 2020-04-20 LAB — POCT ACTIVATED CLOTTING TIME
Activated Clotting Time: 178 seconds
Activated Clotting Time: 166 seconds

## 2020-04-20 LAB — HEPARIN LEVEL (UNFRACTIONATED): Heparin Unfractionated: 1.48 IU/mL — ABNORMAL HIGH (ref 0.30–0.70)

## 2020-04-20 LAB — MAGNESIUM: Magnesium: 2.8 mg/dL — ABNORMAL HIGH (ref 1.7–2.4)

## 2020-04-20 MED ORDER — PRISMASOL BGK 0/2.5 32-2.5 MEQ/L EC SOLN
Status: DC
  Filled 2020-04-20 (×4): qty 5000

## 2020-04-20 MED ORDER — CLONAZEPAM 0.5 MG PO TBDP
1.0000 mg | ORAL_TABLET | Freq: Two times a day (BID) | ORAL | Status: DC
  Administered 2020-04-20 – 2020-04-21 (×2): 1 mg via ORAL
  Filled 2020-04-20 (×2): qty 2

## 2020-04-20 MED ORDER — BUSPIRONE HCL 15 MG PO TABS
7.5000 mg | ORAL_TABLET | Freq: Two times a day (BID) | ORAL | Status: DC
  Administered 2020-04-20 (×2): 7.5 mg via ORAL
  Filled 2020-04-20 (×5): qty 1

## 2020-04-20 MED ORDER — PRISMASOL BGK 0/2.5 32-2.5 MEQ/L EC SOLN
Status: DC
  Filled 2020-04-20 (×6): qty 5000

## 2020-04-20 MED ORDER — NOREPINEPHRINE 16 MG/250ML-% IV SOLN
0.0000 ug/min | INTRAVENOUS | Status: DC
  Administered 2020-04-20: 2 ug/min via INTRAVENOUS
  Filled 2020-04-20: qty 250

## 2020-04-20 MED ORDER — INSULIN ASPART 100 UNIT/ML ~~LOC~~ SOLN: 0.0000 [IU] | Freq: Three times a day (TID) | SUBCUTANEOUS | Status: DC

## 2020-04-20 MED ORDER — LORAZEPAM 2 MG/ML IJ SOLN
0.5000 mg | Freq: Four times a day (QID) | INTRAMUSCULAR | Status: DC | PRN
  Administered 2020-04-21: 0.5 mg via INTRAVENOUS
  Filled 2020-04-20: qty 1
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
## 2020-04-20 NOTE — Progress Notes (Addendum)
Advanced Heart Failure Rounding Note   Subjective:    2/7 febrile. Bld Cx 2/2 Serratia Marcescens 2/11 moved to ICU for CVVHD  2/13 overnight AMS, ABG 7.22/67/69/30, placed on bipap  Remains on dobutamine 2.5 and CVVHD pulling 200/H.  Very restless today that that did not improving with bipap worn for about1 hour.  In AF with RVR despite IV amio and heparin.   Called and updated granddaughter, Lilia Pro on events overnight.  Lilia Pro states similar events occurred of [facility], where he became extremely agitated and hallucinating because CO2 climbed up.  He had to be sedated to remain on the bipap per granddaughter.   Objective:   Weight Range:  Vital Signs:   Temp:  [94.6 F (34.8 C)-97.7 F (36.5 C)] 97.7 F (36.5 C) (02/13 0349) Pulse Rate:  [84-113] 89 (02/13 0405) Resp:  [15-30] 18 (02/13 0405) BP: (99-171)/(37-151) 99/52 (02/13 0405) SpO2:  [82 %-100 %] 100 % (02/13 0405) Last BM Date: 04/17/20  Weight change: Filed Weights   04/17/20 0902 04/18/20 0349 04/19/20 0405  Weight: (!) 137.1 kg (!) 139.1 kg (!) 140.9 kg    Intake/Output:   Intake/Output Summary (Last 24 hours) at 04/20/2020 0502 Last data filed at 04/20/2020 0400 Gross per 24 hour  Intake 1537.86 ml  Output 6397 ml  Net -4859.14 ml    PHYSICAL EXAM: CVP 26 General:  Chronic ill apeparing HEENT: Dry MM.  Neck: supple.  Cor: Iregular rate & rhythm. No rubs, gallops or murmurs. Lungs: Coarse Abdomen: soft, nontender, nondistended. Good bowel sounds. Extremities: no cyanosis, clubbing, rash, + edema Neuro: alert to tactile and verbal stimuli, moans about to adjust self in bed.   Telemetry:  Afib with run of NSVT x 11 beats. Personally reviewed.    Labs: Basic Metabolic Panel: Recent Labs  Lab 04/13/20 0623 04/14/20 0443 04/15/20 0429 04/15/20 0929 04/16/20 0500 04/16/20 1136 04/18/20 0411 04/18/20 1621 04/19/20 0422 04/19/20 1535 04/20/20 0227 04/20/20 0351 04/20/20 0403  04/20/20 0404  NA 132*   < > 129*   < >  --    < > 124* 121* 126* 129* 133* 133*  --  128*  K 4.2   < > 4.8   < >  --    < > 4.3 4.2 4.5 4.7 4.9 5.3*  --  5.0  CL 88*   < > 89*   < >  --    < > 83* 83* 90* 92*  --   --   --  91*  CO2 31   < > 29   < >  --    < > '24 23 24 25  '$ --   --   --  26  GLUCOSE 294*   < > 215*   < >  --    < > 435* 488* 349* 300*  --   --   --  271*  BUN 37*   < > 46*   < >  --    < > 87* 87* 63* 49*  --   --   --  37*  CREATININE 1.86*   < > 2.07*   < >  --    < > 4.40* 5.01* 3.76* 3.16*  --   --   --  2.63*  CALCIUM 8.4*   < > 8.4*   < >  --    < > 8.3* 7.8* 8.2* 8.2*  --   --   --  8.7*  MG 2.3  --  2.4  --  2.6*  --   --   --  2.7*  --   --   --  2.8*  --   PHOS  --   --   --   --   --   --   --  4.1 3.6 3.6  --   --   --  3.9   < > = values in this interval not displayed.    Liver Function Tests: Recent Labs  Lab 04/14/20 0443 04/15/20 0429 04/18/20 1621 04/19/20 0422 04/19/20 1535 04/20/20 0404  AST 22 28  --   --   --   --   ALT 18 22  --   --   --   --   ALKPHOS 61 71  --   --   --   --   BILITOT 0.8 1.0  --   --   --   --   PROT 6.9 6.4*  --   --   --   --   ALBUMIN 2.8* 2.7* 2.2* 2.4* 2.5* 2.6*   No results for input(s): LIPASE, AMYLASE in the last 168 hours. No results for input(s): AMMONIA in the last 168 hours.  CBC: Recent Labs  Lab 04/14/20 1433 04/15/20 0429 04/17/20 0413 04/17/20 1700 04/18/20 0411 04/19/20 0030 04/19/20 0422 04/20/20 0227 04/20/20 0351 04/20/20 0403  WBC 8.3   < > 11.0* 13.3* 11.6*  --  11.0*  --   --  12.0*  NEUTROABS 6.5  --   --   --   --   --   --   --   --   --   HGB 10.0*   < > 7.7* 8.2* 7.2* 7.8* 8.0* 10.5* 10.9* 8.5*  HCT 34.7*   < > 25.3* 26.6* 23.9* 25.6* 26.4* 31.0* 32.0* 28.5*  MCV 82.4   < > 79.6* 78.0* 78.4*  --  78.6*  --   --  81.2  PLT 399   < > 330 419* 382  --  377  --   --  393   < > = values in this interval not displayed.    Cardiac Enzymes: No results for input(s): CKTOTAL, CKMB,  CKMBINDEX, TROPONINI in the last 168 hours.  BNP: BNP (last 3 results) Recent Labs    12/10/19 0057 02/13/20 0807 03/20/2020 2019  BNP 154.7* 111.6* 195.0*    ProBNP (last 3 results) No results for input(s): PROBNP in the last 8760 hours.    Other results:  Imaging: DG Abd 1 View  Result Date: 04/18/2020 CLINICAL DATA:  Abdominal distension. EXAM: ABDOMEN - 1 VIEW COMPARISON:  None. FINDINGS: The bowel gas pattern is normal. No radio-opaque calculi or other significant radiographic abnormality are seen. IMPRESSION: Negative. Electronically Signed   By: Marijo Conception M.D.   On: 04/18/2020 14:22   DG CHEST PORT 1 VIEW  Result Date: 04/18/2020 CLINICAL DATA:  Central line placement EXAM: PORTABLE CHEST 1 VIEW COMPARISON:  04/14/2020 FINDINGS: Bilateral interstitial and alveolar airspace opacities. No pleural effusion or pneumothorax. Stable cardiomegaly. Left-sided PICC line with the tip projecting over the SVC. Left jugular central venous catheter with the tip projecting over the SVC. No acute osseous abnormality. IMPRESSION: Bilateral interstitial and alveolar airspace opacities concerning for multilobar pneumonia versus pulmonary edema. Left-sided PICC line with the tip projecting over the SVC. Left jugular central venous catheter with the tip projecting over the SVC. Electronically Signed   By: Elbert Ewings  Patel   On: 04/18/2020 15:53     Medications:     Scheduled Medications: . sodium chloride   Intravenous Once  . atorvastatin  40 mg Oral Daily  . Chlorhexidine Gluconate Cloth  6 each Topical Daily  . clonazePAM  0.5 mg Oral BID  . insulin aspart  0-20 Units Subcutaneous TID WC  . insulin aspart  10 Units Subcutaneous TID WC  . insulin glargine  45 Units Subcutaneous BID  . melatonin  3 mg Oral QHS  . multivitamin with minerals  1 tablet Oral Daily  . sodium chloride flush  3 mL Intravenous Q12H    Infusions: .  prismasol BGK 4/2.5 500 mL/hr at 04/20/20 0349  .   prismasol BGK 4/2.5 300 mL/hr at 04/19/20 1103  . sodium chloride Stopped (04/17/20 1931)  . amiodarone 30 mg/hr (04/20/20 0400)  . ceFEPime (MAXIPIME) IV Stopped (04/19/20 2203)  . DOBUTamine 2.5 mcg/kg/min (04/20/20 0400)  . [START ON 04/23/2020] ferumoxytol    . heparin 1,400 Units/hr (04/20/20 0425)  . prismasol BGK 4/2.5 1,500 mL/hr at 04/20/20 0251    PRN Medications: sodium chloride, acetaminophen **OR** acetaminophen, alum & mag hydroxide-simeth, [COMPLETED] diphenhydrAMINE **FOLLOWED BY** diphenhydrAMINE, heparin, hydrALAZINE, hydrocortisone cream, hydrOXYzine, loperamide, midazolam, Muscle Rub, ondansetron (ZOFRAN) IV, polyethylene glycol, sodium chloride flush, traMADol   Assessment/Plan:   1.  Acute on chronic systolic heart failure - Echo (11/2018) EF 25-30%, trace MR, trivial TR, moderately reduced RSVF.   - Cath (12/2018) RA 10, RV 55/8, PA 54/15 (27), PCW = 16, Fick CO/CI = 7.0/2.8 - Echo (04/04/2020) EF 45-50%, mild LVH, hypokinesis LV, dilated LA, dilated RV/RA, mod MR, mod-severe TR, RVSP > 60 mmHg.  - Milrinone started on 1/29 for low CO-OX. Switched to [place] on 2/11 - Todays CO-OX 63% on dobutamine 2.5 - CVVHD started 2/11 w/ fluid removal at 200/hr.  - Renal recommendations appreciated.  - Holding [place] and dig for now.  - Hold spiro for now given AKI.  - PRN hydral for HTN   2. Persistent AF - has been on amio at home - suspect has been in AF for several weeks. ECG in 12/21 was NSR - Last week he went back in NSR but over the weekend back in A fib RVR.  - Will need cardioversion, once infection clears.  - Continue IV amio and heparin  3. Gram-negative (serratia marcescens) sepsis - 2/7 Fever. Lactic Acid 2.2>1.2  - 2/7Bld Cx 2/2-Serratia Marcescens - On cefepime. ID following  4. Acute on chronic hypoxic respiratory failure/OSA  - needs volume removal - can use CPAP as needed - Became AMS overnight on 2/13, ABG showed elevated PaCO2, placed on bipap.   - He is DNR  5. AKI on CKD, Stage 3b -> ESRD - Baseline Cr 1.5-1.7 -  CVVHD > 2.6 - Renal u/s no acute findings.  - Change milrinone for DBA.  - CVVHD started 2/11 - not candidate for outpatient HD  6. Venous stasis wounds L tib/fib, L heel & Sacral wound - Wound care consulted. Primary team managing   7. T2DM - A1c 12.4% recent hospitalization - SSI and lantus Per primary team - Would be concerned about SGT2i with mild erythema under pannus  8. Morbidly obese Body mass index is 48.65 kg/m.  9. Lung nodule - Will need outpatient imaging in 3 months, 9 mm CT (03/2020)  10. Hypokalemia/ Hypomagnesemia -K /Mag stable.  - Keep K> 4.0 Mg > 2.0   11. Hypervolemic Hyponatremia - Corrected  Sodium 131. Restrict free water.   - Will correct with CVVHD  12. Anemia/Hx of GIB - Hgb 8.5. No obvious source of bleeding  - EGD 12/2019 nonbleeding duodenal ulcer with gastritis - Received Feraheme 04/09/20  - monitor closely, defer transfusion to IM   13. Altered mental status ABG showed hypercapnia, placed on bipap x 1 hour no change in hypercapnia -? Secondary to medications vs. Delirium vs. Infectious source -continue to monitor for now  14. DNR/DNI  Length of Stay: [place] NP 04/20/2020, 5:02 AM  Advanced Heart Failure Team Pager [phone] (M-F; 7a - 4p)  Please contact [facility] for night-coverage after hours (4p -7a ) and weekends on [email or web address]  Agree with above.   Remains on CVVHD pulling ~200/hr. Weight down 11 pounds. On DBA 2.5. Co-ox 63%. Very agitated and anxious. Unable to tolerate Bipap. + SOB  General: Sitting up in bed. Ill appearing HEENT: normal Neck: supple. LIJ dialysis cath. Carotids 2+ bilat; no bruits. No lymphadenopathy or thryomegaly appreciated. Cor: PMI nondisplaced. Irregular rate & rhythm. No rubs, gallops or murmurs. Lungs: + crackles Abdomen: obese soft, nontender, + distended. No hepatosplenomegaly. No bruits or masses.  Good bowel sounds. Extremities: no cyanosis, clubbing, rash, 2-3+ edema Neuro: alert & orientedx3, cranial nerves grossly intact. moves all 4 extremities w/o difficulty. Affect pleasant  Essentially anuric. Remains markedly volume overloaded but pulling well with CVVHD. Will continue. Continue DBA for cardiac support. Co-ox improved. ABG shows respiratory acidosis but unable to tolerate Bipap due to anxiety/agitation. Continue klonopin.   Palliative Care seeing today. Will await recs regarding further anxiety meds.   He is DNR/DNI and not candidate for long-term HD. If deteriorates or renal function does not improve will transition to comfort care.   CRITICAL CARE Performed by: [name]  Total critical care time: 45 minutes  Critical care time was exclusive of separately billable procedures and treating other patients.  Critical care was necessary to treat or prevent imminent or life-threatening deterioration.  Critical care was time spent personally by me (independent of midlevel providers or residents) on the following activities: development of treatment plan with patient and/or surrogate as well as nursing, discussions with consultants, evaluation of patient's response to treatment, examination of patient, obtaining history from patient or surrogate, ordering and performing treatments and interventions, ordering and review of laboratory studies, ordering and review of radiographic studies, pulse oximetry and re-evaluation of patient's condition.  [name], MD  10:15 AM

## 2020-04-20 NOTE — Progress Notes (Addendum)
Patient refusing to continue wearing bipap Only tolerated bipap for less than an hour  Placed back on 10L HFNC

## 2020-04-20 NOTE — Progress Notes (Signed)
During shift report, patient ripping BiPAP mask off. Patient placed on HFNC. O2 sats 80s-90s. Patient not able to answer orientation questions but will follow commands.
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
## 2020-04-20 NOTE — Progress Notes (Addendum)
Spoke with Bensimhon MD in regards to patient's low BP and inability to pull fluid on CRRT. Orders received for levophed with goal SBP > 100 and to pull more fluid.

## 2020-04-20 NOTE — Progress Notes (Signed)
Called to pt's room to draw ABG. Pt placed on Bipap after ABG results. Pt tolerating well.

## 2020-04-20 NOTE — Progress Notes (Signed)
PROGRESS NOTE    Raymond Pratt  [id number] DOB: [birth year] DOA: 03/27/2020 PCP: Shon Baton, MD    Chief Complaint  Patient presents with  . Shortness of Breath    CHF    Brief Narrative:  70 year old gentleman with prior history of chronic systolic and diastolic heart failure, atrial fibrillation on anticoagulation, hypertension, obstructive sleep apnea on CPAP, diabetes mellitus, coronary artery disease presents to ED with worsening shortness of breath.  On arrival to ED was found to be hypoxic requiring up to 4 L of nasal cannula oxygen.  Chest x-ray showed pulmonary vascular congestion. He was admitted for acute hypoxic respiratory failure secondary to acute on chronic combined CHF. He is currently on 4 lit of [place] oxygen to keep sats greater than 90%. Cardiology on board and started him on IV milrinone.  Hospital course complicated by worsening renal parameters, nephrology consulted, unfortunately he is not a candidate for outpatient dialysis. Pt understands and agreed for short term HD/ CRRT for 48 hours . If no improvement  In renal parameters after CRRT for 48 hours, nephrology recommends comfort measures as he is not a candidate for outpatient dialysis. Pt seen and examined, he is sleepy, has temp of 97 and is on warmer. His BP has been low 88/50 mmhg. Levo phed started by cardiology.  Poor prognosis , he is  Clinically declining despite aggressive measures with low cardiac output, hypotension, anuric, delirious, hypothermic,  Acute respiratory failure with respiratory acidosis with hypercapnia ( unable to tolerate IPAP), despite dobutamine gtt, levo gtt, CRRT     Assessment & Plan:   Principal Problem:   Acute exacerbation of CHF (congestive heart failure) (HCC) Active Problems:   Type 2 diabetes mellitus with hyperlipidemia (HCC)   OSA on CPAP   Anticoagulated on Coumadin   HTN (hypertension)   A-fib (HCC)   Anemia   Atrial fibrillation with RVR (HCC)    Pressure injury of skin   Acute respiratory failure with hypoxia (HCC)   Gram-negative sepsis (HCC)   Acute hypoxic and hypercapnic respiratory failure secondary to acute on chronic systolic and diastolic heart failure requiring up to 5 lit of [place] oxygen to keep sats greater than 90%.not able to tolerate BIPAP Chest x-ray on admission shows pulmonary vascular congestion and perihilar edema. Echocardiogram shows left ventricular ejection fraction of 45 to 50% with hypokinesis, dilated right ventricle and right atrium, moderate MR moderate to severe TR.  Cardiology on board , started him on IV Milrinone, transition to IV dobutamine along with high-dose IV Lasix, transferred to ICU on to their service for CRRT. Low BP this afternoon requiring levo gtt.   Strict intake and output and daily weights.      Persistent atrial fibrillation Rate not well controlled and currently on IV amiodarone.  Patient will need TEE DCCV once bacteremia/infection clears. Eliquis on hold. Transitioned to heparin.     Sepsis in the setting of Serratia bacteremia Blood cultures positive for GNR. Patient currently on cefepime. ID on board and appreciate recommendations to continue with cefepime for 7-day course.  DC antibiotics after the last dose on 04/20/2020 Repeat BP cultures have been negative so far. Afebrile , persistent leukocytosis.      Anemia of chronic disease from CKD  Hemoglobin around 7.2 today.  Transfused 1 unit of prbc transfusion. Repeat hemoglobin around 8.  Transfuse to keep hemoglobin greater than 7. IV Ferraheme infusion ordered by nephrology.     Hyponatremia  From CKD and fluid overload  Diabetes mellitus with hyperglycemia Hemoglobin A1c around 12.4. CBG (last 3)  Recent Labs    04/19/20 1539 04/19/20 2154 04/20/20 0607  GLUCAP 290* 104* 217*   D/c novolog TIDAC AS PT is not eating. Decrease to mod SSI.  Continue with lantus 45 units BID.     Body mass index is 46.92  kg/m. Morbid obesity Poor prognostic factor   Pressure injury stage II pressure injury present on right buttocks,  present on admission:  Pressure Injury 04/04/20 Heel Left Stage 3 -  Full thickness tissue loss. Subcutaneous fat may be visible but bone, tendon or muscle are NOT exposed. (Active)  04/04/20   Location: Heel  Location Orientation: Left  Staging: Stage 3 -  Full thickness tissue loss. Subcutaneous fat may be visible but bone, tendon or muscle are NOT exposed.  Wound Description (Comments):   Present on Admission: Yes     Pressure Injury 04/04/20 Left Stage 3 -  Full thickness tissue loss. Subcutaneous fat may be visible but bone, tendon or muscle are NOT exposed. (Active)  04/04/20   Location:   Location Orientation: Left  Staging: Stage 3 -  Full thickness tissue loss. Subcutaneous fat may be visible but bone, tendon or muscle are NOT exposed.  Wound Description (Comments):   Present on Admission: Yes     Pressure Injury 04/04/20 Buttocks Right Stage 2 -  Partial thickness loss of dermis presenting as a shallow open injury with a red, pink wound bed without slough. (Active)  04/04/20   Location: Buttocks  Location Orientation: Right  Staging: Stage 2 -  Partial thickness loss of dermis presenting as a shallow open injury with a red, pink wound bed without slough.  Wound Description (Comments):   Present on Admission: Yes   Wound care consulted and recommendations given.   Acute on chronic stage IIIb CKD Baseline creatinine around 1.7, creatinine increased to greater than 2.6 to 3.22 to 4.02 to 4.4. TO 2.63. ANURIC at this time.  Holding Aldactone, Entresto at this time.  Nephrology consulted unfortunately he is not a candidate for outpatient dialysis. Pt understands and agreed for short term HD/ CRRT for 48 hours . If no improvement  In renal parameters after CRRT for 48 hours, nephrology recommends comfort measures as he is not a candidate for outpatient  dialysis. US renal  Shows Normal renal ultrasound. With CRRT, IV lasix and metolazone creatinine around 2.63   Chronic bilateral venous stasis dermatitis Continue with wound care.    Lung nodule noted on the CT scan     Hypokalemia and hypomagnesemia Replaced   History of coronary artery disease     DVT prophylaxis: Heparin Code Status: Full code.  Family Communication:  None at bedside.  Disposition:   Status is: Inpatient  Remains inpatient appropriate because:Ongoing diagnostic testing needed not appropriate for outpatient work up, Unsafe d/c plan, IV treatments appropriate due to intensity of illness or inability to take PO and Inpatient level of care appropriate due to severity of illness   Dispo: The patient is from: Home              Anticipated d/c is to: pending.               Anticipated d/c date is: > 3 days              Patient currently is not medically stable to d/c.   Difficult to place patient No       Level of  care: ICU Consultants:   Cardiology.   Nephrology.   palliative care  Procedures:  Antimicrobials: Antibiotics Given (last 72 hours)    Date/Time Action Medication Dose Rate   04/17/20 1712 New Bag/Given   ceFEPIme (MAXIPIME) 2 g in sodium chloride 0.9 % 100 mL IVPB 2 g 200 mL/hr   04/18/20 1719 New Bag/Given   ceFEPIme (MAXIPIME) 2 g in sodium chloride 0.9 % 100 mL IVPB 2 g 200 mL/hr   04/19/20 0951 New Bag/Given   ceFEPIme (MAXIPIME) 2 g in sodium chloride 0.9 % 100 mL IVPB 2 g 200 mL/hr   04/19/20 2133 New Bag/Given   ceFEPIme (MAXIPIME) 2 g in sodium chloride 0.9 % 100 mL IVPB 2 g 200 mL/hr   04/20/20 0916 New Bag/Given   ceFEPIme (MAXIPIME) 2 g in sodium chloride 0.9 % 100 mL IVPB 2 g 200 mL/hr       Subjective: Drowsy, on warmer.  Objective: Vitals:   04/20/20 0700 04/20/20 0702 04/20/20 0800 04/20/20 0900  BP: 104/70 1'04/70 98/74 99/84 '$  Pulse: 84 97 86 90  Resp: '14 15 14 20  '$ Temp:  (!) 97 F (36.1 C)     TempSrc:  Axillary    SpO2: 100% 100% (!) 86% 100%  Weight:      Height:        Intake/Output Summary (Last 24 hours) at 04/20/2020 0919 Last data filed at 04/20/2020 0900 Gross per 24 hour  Intake 1401.34 ml  Output 6768 ml  Net -5366.66 ml   Filed Weights   04/18/20 0349 04/19/20 0405 04/20/20 0625  Weight: (!) 139.1 kg (!) 140.9 kg 135.9 kg    Examination:  General exam: Ill-appearing gentleman on nasal cannula oxygen, has warmer on Respiratory system: Diminished air entry at bases cardiovascular system: S1-S2 heard, tachycardic, irregularly irregular Gastrointestinal system: Abdomen is soft Central nervous system: Drowsy,delirous.  Extremities: Lower extremity edema Skin: Pressure injury on the right buttocks Psychiatry: cannot be assessed.    Data Reviewed: I have personally reviewed following labs and imaging studies  CBC: Recent Labs  Lab 04/14/20 1433 04/15/20 0429 04/17/20 0413 04/17/20 1700 04/18/20 0411 04/19/20 0030 04/19/20 0422 04/20/20 0227 04/20/20 0351 04/20/20 0403  WBC 8.3   < > 11.0* 13.3* 11.6*  --  11.0*  --   --  12.0*  NEUTROABS 6.5  --   --   --   --   --   --   --   --   --   HGB 10.0*   < > 7.7* 8.2* 7.2* 7.8* 8.0* 10.5* 10.9* 8.5*  HCT 34.7*   < > 25.3* 26.6* 23.9* 25.6* 26.4* 31.0* 32.0* 28.5*  MCV 82.4   < > 79.6* 78.0* 78.4*  --  78.6*  --   --  81.2  PLT 399   < > 330 419* 382  --  377  --   --  393   < > = values in this interval not displayed.    Basic Metabolic Panel: Recent Labs  Lab 04/15/20 0429 04/15/20 0929 04/16/20 0500 04/16/20 1136 04/18/20 0411 04/18/20 1621 04/19/20 0422 04/19/20 1535 04/20/20 0227 04/20/20 0351 04/20/20 0403 04/20/20 0404  NA 129*   < >  --    < > 124* 121* 126* 129* 133* 133*  --  128*  K 4.8   < >  --    < > 4.3 4.2 4.5 4.7 4.9 5.3*  --  5.0  CL 89*   < >  --    < >  83* 83* 90* 92*  --   --   --  91*  CO2 29   < >  --    < > '24 23 24 25  '$ --   --   --  26  GLUCOSE 215*   < >  --     < > 435* 488* 349* 300*  --   --   --  271*  BUN 46*   < >  --    < > 87* 87* 63* 49*  --   --   --  37*  CREATININE 2.07*   < >  --    < > 4.40* 5.01* 3.76* 3.16*  --   --   --  2.63*  CALCIUM 8.4*   < >  --    < > 8.3* 7.8* 8.2* 8.2*  --   --   --  8.7*  MG 2.4  --  2.6*  --   --   --  2.7*  --   --   --  2.8*  --   PHOS  --   --   --   --   --  4.1 3.6 3.6  --   --   --  3.9   < > = values in this interval not displayed.    GFR: Estimated Creatinine Clearance: 35.7 mL/min (A) (by C-G formula based on SCr of 2.63 mg/dL (H)).  Liver Function Tests: Recent Labs  Lab 04/14/20 0443 04/15/20 0429 04/18/20 1621 04/19/20 0422 04/19/20 1535 04/20/20 0404  AST 22 28  --   --   --   --   ALT 18 22  --   --   --   --   ALKPHOS 61 71  --   --   --   --   BILITOT 0.8 1.0  --   --   --   --   PROT 6.9 6.4*  --   --   --   --   ALBUMIN 2.8* 2.7* 2.2* 2.4* 2.5* 2.6*    CBG: Recent Labs  Lab 04/19/20 1128 04/19/20 1523 04/19/20 1539 04/19/20 2154 04/20/20 0607  GLUCAP 164* 431* 290* 104* 217*     Recent Results (from the past 240 hour(s))  Culture, blood (routine x 2)     Status: Abnormal   Collection Time: 04/14/20  9:37 AM   Specimen: BLOOD  Result Value Ref Range Status   Specimen Description BLOOD RIGHT ANTECUBITAL  Final   Special Requests   Final    BOTTLES DRAWN AEROBIC AND ANAEROBIC Blood Culture results may not be optimal due to an inadequate volume of blood received in culture bottles Performed at [facility] [address], [place], [place] [address]    Culture  Setup Time   Final    CORRECTED RESULTS GRAM NEGATIVE RODS PREVIOUSLY REPORTED AS: GRAM NEGATIVE COCCI CORRECTED RESULTS CALLED TO: Justice Britain CHEN [id number] FCP    Culture SERRATIA MARCESCENS (A)  Final   Report Status 04/17/2020 FINAL  Final   Organism ID, Bacteria SERRATIA MARCESCENS  Final      Susceptibility   Serratia marcescens - MIC*    CEFAZOLIN >=64 RESISTANT Resistant      CEFEPIME <=0.12 SENSITIVE Sensitive     CEFTAZIDIME <=1 SENSITIVE Sensitive     CEFTRIAXONE <=0.25 SENSITIVE Sensitive     CIPROFLOXACIN <=0.25 SENSITIVE Sensitive     GENTAMICIN <=1 SENSITIVE Sensitive     TRIMETH/SULFA <=  20 SENSITIVE Sensitive     * SERRATIA MARCESCENS  Blood Culture ID Panel (Reflexed)     Status: Abnormal   Collection Time: 04/14/20  9:37 AM  Result Value Ref Range Status   Enterococcus faecalis NOT DETECTED NOT DETECTED Final   Enterococcus Faecium NOT DETECTED NOT DETECTED Final   Listeria monocytogenes NOT DETECTED NOT DETECTED Final   Staphylococcus species NOT DETECTED NOT DETECTED Final   Staphylococcus aureus (BCID) NOT DETECTED NOT DETECTED Final   Staphylococcus epidermidis NOT DETECTED NOT DETECTED Final   Staphylococcus lugdunensis NOT DETECTED NOT DETECTED Final   Streptococcus species NOT DETECTED NOT DETECTED Final   Streptococcus agalactiae NOT DETECTED NOT DETECTED Final   Streptococcus pneumoniae NOT DETECTED NOT DETECTED Final   Streptococcus pyogenes NOT DETECTED NOT DETECTED Final   A.calcoaceticus-baumannii NOT DETECTED NOT DETECTED Final   Bacteroides fragilis NOT DETECTED NOT DETECTED Final   Enterobacterales DETECTED (A) NOT DETECTED Final    Comment: Enterobacterales represent a large order of gram negative bacteria, not a single organism. CRITICAL RESULT CALLED TO, READ BACK BY AND VERIFIED WITH: [place] ON  04/15/2020    Enterobacter cloacae complex NOT DETECTED NOT DETECTED Final   Escherichia coli NOT DETECTED NOT DETECTED Final   Klebsiella aerogenes NOT DETECTED NOT DETECTED Final   Klebsiella oxytoca NOT DETECTED NOT DETECTED Final   Klebsiella pneumoniae NOT DETECTED NOT DETECTED Final   Proteus species NOT DETECTED NOT DETECTED Final   Salmonella species NOT DETECTED NOT DETECTED Final   Serratia marcescens DETECTED (A) NOT DETECTED Final    Comment: CRITICAL RESULT CALLED TO, READ BACK BY AND  VERIFIED WITH: PHARMD AMEND C. BY MESSAN H. AT 0320 ON  04/15/2020    Haemophilus influenzae NOT DETECTED NOT DETECTED Final   Neisseria meningitidis NOT DETECTED NOT DETECTED Final   Pseudomonas aeruginosa NOT DETECTED NOT DETECTED Final   Stenotrophomonas maltophilia NOT DETECTED NOT DETECTED Final   Candida albicans NOT DETECTED NOT DETECTED Final   Candida auris NOT DETECTED NOT DETECTED Final   Candida glabrata NOT DETECTED NOT DETECTED Final   Candida krusei NOT DETECTED NOT DETECTED Final   Candida parapsilosis NOT DETECTED NOT DETECTED Final   Candida tropicalis NOT DETECTED NOT DETECTED Final   Cryptococcus neoformans/gattii NOT DETECTED NOT DETECTED Final   CTX-M ESBL NOT DETECTED NOT DETECTED Final   Carbapenem resistance IMP NOT DETECTED NOT DETECTED Final   Carbapenem resistance KPC NOT DETECTED NOT DETECTED Final   Carbapenem resistance NDM NOT DETECTED NOT DETECTED Final   Carbapenem resist OXA 48 LIKE NOT DETECTED NOT DETECTED Final   Carbapenem resistance VIM NOT DETECTED NOT DETECTED Final    Comment: Performed at [facility], [address], [place], [place] [address]  Culture, blood (routine x 2)     Status: Abnormal   Collection Time: 04/14/20  9:42 AM   Specimen: BLOOD RIGHT HAND  Result Value Ref Range Status   Specimen Description BLOOD RIGHT HAND  Final   Special Requests   Final    BOTTLES DRAWN AEROBIC AND ANAEROBIC Blood Culture results may not be optimal due to an inadequate volume of blood received in culture bottles   Culture  Setup Time   Final    CORRECTED RESULTS GRAM NEGATIVE RODS PREVIOUSLY REPORTED AS: GRAM NEGATIVE COCCI CORRECTED RESULTS CALLED TO: PHARMD LYDIA CHEN [id number] FCP    Culture (A)  Final    SERRATIA MARCESCENS SUSCEPTIBILITIES PERFORMED ON PREVIOUS  CULTURE WITHIN THE LAST 5 DAYS. Performed at [facility], [place] [address], [place], [place] [address]    Report Status 04/17/2020 FINAL  Final  Culture, Urine      Status: Abnormal   Collection Time: 04/17/20  1:05 PM   Specimen: Urine, Random  Result Value Ref Range Status   Specimen Description URINE, RANDOM  Final   Special Requests NONE  Final   Culture (A)  Final    <10,000 COLONIES/mL INSIGNIFICANT GROWTH Performed at [facility], [place] [address] [place], [place] [address]    Report Status 04/18/2020 FINAL  Final  Culture, blood (Routine X 2) w Reflex to ID Panel     Status: None (Preliminary result)   Collection Time: 04/17/20  2:16 PM   Specimen: BLOOD RIGHT HAND  Result Value Ref Range Status   Specimen Description BLOOD RIGHT HAND  Final   Special Requests   Final    BOTTLES DRAWN AEROBIC AND ANAEROBIC Blood Culture adequate volume   Culture   Final    NO GROWTH 2 DAYS Performed at [facility], [place] [address], [place], [place] [address]    Report Status PENDING  Incomplete  Culture, blood (Routine X 2) w Reflex to ID Panel     Status: None (Preliminary result)   Collection Time: 04/17/20  2:17 PM   Specimen: BLOOD  Result Value Ref Range Status   Specimen Description BLOOD RIGHT ANTECUBITAL  Final   Special Requests   Final    BOTTLES DRAWN AEROBIC AND ANAEROBIC Blood Culture results may not be optimal due to an inadequate volume of blood received in culture bottles   Culture   Final    NO GROWTH 2 DAYS Performed at [facility], [place] [address] [place], [place] [address]    Report Status PENDING  Incomplete  MRSA PCR Screening     Status: None   Collection Time: 04/18/20  2:39 PM   Specimen: Nasopharyngeal  Result Value Ref Range Status   MRSA by PCR NEGATIVE NEGATIVE Final    Comment:        The GeneXpert MRSA Assay (FDA approved for NASAL specimens only), is one component of a comprehensive MRSA colonization surveillance program. It is not intended to diagnose MRSA infection nor to guide or monitor treatment for MRSA infections. Performed at [facility], [place] [address], [place],  [place] [address]          Radiology Studies: DG Abd 1 View  Result Date: 04/18/2020 CLINICAL DATA:  Abdominal distension. EXAM: ABDOMEN - 1 VIEW COMPARISON:  None. FINDINGS: The bowel gas pattern is normal. No radio-opaque calculi or other significant radiographic abnormality are seen. IMPRESSION: Negative. Electronically Signed   By: Marijo Conception M.D.   On: 04/18/2020 14:22   DG CHEST PORT 1 VIEW  Result Date: 04/18/2020 CLINICAL DATA:  Central line placement EXAM: PORTABLE CHEST 1 VIEW COMPARISON:  04/14/2020 FINDINGS: Bilateral interstitial and alveolar airspace opacities. No pleural effusion or pneumothorax. Stable cardiomegaly. Left-sided PICC line with the tip projecting over the SVC. Left jugular central venous catheter with the tip projecting over the SVC. No acute osseous abnormality. IMPRESSION: Bilateral interstitial and alveolar airspace opacities concerning for multilobar pneumonia versus pulmonary edema. Left-sided PICC line with the tip projecting over the SVC. Left jugular central venous catheter with the tip projecting over the SVC. Electronically Signed   By: Kathreen Devoid   On: 04/18/2020 15:53  Scheduled Meds: . sodium chloride   Intravenous Once  . atorvastatin  40 mg Oral Daily  . Chlorhexidine Gluconate Cloth  6 each Topical Daily  . clonazePAM  0.5 mg Oral BID  . insulin aspart  0-20 Units Subcutaneous TID WC  . insulin aspart  10 Units Subcutaneous TID WC  . insulin glargine  45 Units Subcutaneous BID  . melatonin  3 mg Oral QHS  . multivitamin with minerals  1 tablet Oral Daily  . sodium chloride flush  3 mL Intravenous Q12H   Continuous Infusions: . sodium chloride Stopped (04/17/20 1931)  . amiodarone 30 mg/hr (04/20/20 0900)  . ceFEPime (MAXIPIME) IV 2 g (04/20/20 0916)  . DOBUTamine 2.5 mcg/kg/min (04/20/20 0900)  . [START ON 04/23/2020] ferumoxytol    . heparin 1,600 Units/hr (04/20/20 0900)  . prismasol BGK 2/2.5 replacement solution    .  prismasol BGK 2/2.5 replacement solution    . prismasol BGK 4/2.5 1,500 mL/hr at 04/20/20 0559     LOS: 18 days       Hosie Poisson, MD [facility]   To contact the attending provider between 7A-7P or the covering provider during after hours 7P-7A, please log into the web site [email or web address] and access using universal [place] password for that web site. If you do not have the password, please call the hospital operator.  04/20/2020, 9:19 AM

## 2020-04-20 NOTE — Progress Notes (Addendum)
[facility] NEPHROLOGY PROGRESS NOTE  Assessment/ Plan: Pt is a 70 y.o. yo male  with HFrEF (9/20 25-30%, 03/2020 45-50%), atrial fibrillation,  OSA, DM type 2, obesity BMI > 45, HTN who is admitted since 1/27 with dyspnea/volume overload who seen for evaluation and management of AKI.    #Acute on chronic systolic CHF: Decompensated heart failure.Volume management via CRRT.  On dobutamine for inotropic support.   #Acute kidney injury on CKD 3 due to cardiorenal syndrome/bacteremia.  Refractory to diuretics with inotropic support.  He is massively fluid overload.  Initiated CRRT on 2/11 for ultrafiltration.  May need pressors to help with ultrafiltration.  Currently 150 cc an hour.  Not much clinical improvement with CRRT so far and he has multiorgan failure.  We will continue CRRT till tomorrow morning and if no improvement then I recommend comfort measures.  Discussed with the palliative care team, patient is currently DNR.  The CRRT bath changed to 2K pre and post filter and continue 4K dialysate, IV heparin for systemic anticoagulation.  Discussed with ICU nurse as well.   #Serratia bacteremia: Currently on cefepime.  ID following.  #Hyponatremia, hypervolemic: Now managed with CRRT.  Monitor lab.  #A. fib with RVR: Tachycardic.  Currently on amiodarone IV and Eliquis.  #Anemia of critical illness/renal failure: Received iron.  ESA on 2/11.  Monitor hemoglobin and transfuse as needed.  #Acute encephalopathy: Multifactorial including metabolic, CO2 narcosis: Refusing BiPAP.  Patient is quite somnolent this morning.  Subjective: Seen and examined at bedside.  No urine output.  Somnolent and not following commands.  On CRRT with soft blood pressure.  Remains on dobutamine. Objective Vital signs in last 24 hours: Vitals:   04/20/20 0625 04/20/20 0640 04/20/20 0700 04/20/20 0702  BP:   104/70 104/70  Pulse:  86 84 97  Resp:  '16 14 15  '$ Temp:    (!) 97 F (36.1 C)  TempSrc:     Axillary  SpO2:  100% 100% 100%  Weight: 135.9 kg     Height:       Weight change: -5 kg  Intake/Output Summary (Last 24 hours) at 04/20/2020 0801 Last data filed at 04/20/2020 0700 Gross per 24 hour  Intake 1448.65 ml  Output 6511 ml  Net -5062.35 ml       Labs: Basic Metabolic Panel: Recent Labs  Lab 04/19/20 0422 04/19/20 1535 04/20/20 0227 04/20/20 0351 04/20/20 0404  NA 126* 129* 133* 133* 128*  K 4.5 4.7 4.9 5.3* 5.0  CL 90* 92*  --   --  91*  CO2 24 25  --   --  26  GLUCOSE 349* 300*  --   --  271*  BUN 63* 49*  --   --  37*  CREATININE 3.76* 3.16*  --   --  2.63*  CALCIUM 8.2* 8.2*  --   --  8.7*  PHOS 3.6 3.6  --   --  3.9   Liver Function Tests: Recent Labs  Lab 04/14/20 0443 04/15/20 0429 04/18/20 1621 04/19/20 0422 04/19/20 1535 04/20/20 0404  AST 22 28  --   --   --   --   ALT 18 22  --   --   --   --   ALKPHOS 61 71  --   --   --   --   BILITOT 0.8 1.0  --   --   --   --   PROT 6.9 6.4*  --   --   --   --  ALBUMIN 2.8* 2.7*   < > 2.4* 2.5* 2.6*   < > = values in this interval not displayed.   No results for input(s): LIPASE, AMYLASE in the last 168 hours. No results for input(s): AMMONIA in the last 168 hours. CBC: Recent Labs  Lab 04/14/20 1433 04/15/20 0429 04/17/20 0413 04/17/20 1700 04/18/20 0411 04/19/20 0030 04/19/20 0422 04/20/20 0227 04/20/20 0351 04/20/20 0403  WBC 8.3   < > 11.0* 13.3* 11.6*  --  11.0*  --   --  12.0*  NEUTROABS 6.5  --   --   --   --   --   --   --   --   --   HGB 10.0*   < > 7.7* 8.2* 7.2*   < > 8.0* 10.5* 10.9* 8.5*  HCT 34.7*   < > 25.3* 26.6* 23.9*   < > 26.4* 31.0* 32.0* 28.5*  MCV 82.4   < > 79.6* 78.0* 78.4*  --  78.6*  --   --  81.2  PLT 399   < > 330 419* 382  --  377  --   --  393   < > = values in this interval not displayed.   Cardiac Enzymes: No results for input(s): CKTOTAL, CKMB, CKMBINDEX, TROPONINI in the last 168 hours. CBG: Recent Labs  Lab 04/19/20 1128 04/19/20 1523  04/19/20 1539 04/19/20 2154 04/20/20 0607  GLUCAP 164* 431* 290* 104* 217*    Iron Studies: No results for input(s): IRON, TIBC, TRANSFERRIN, FERRITIN in the last 72 hours. Studies/Results: DG Abd 1 View  Result Date: 04/18/2020 CLINICAL DATA:  Abdominal distension. EXAM: ABDOMEN - 1 VIEW COMPARISON:  None. FINDINGS: The bowel gas pattern is normal. No radio-opaque calculi or other significant radiographic abnormality are seen. IMPRESSION: Negative. Electronically Signed   By: Marijo Conception M.D.   On: 04/18/2020 14:22   DG CHEST PORT 1 VIEW  Result Date: 04/18/2020 CLINICAL DATA:  Central line placement EXAM: PORTABLE CHEST 1 VIEW COMPARISON:  04/14/2020 FINDINGS: Bilateral interstitial and alveolar airspace opacities. No pleural effusion or pneumothorax. Stable cardiomegaly. Left-sided PICC line with the tip projecting over the SVC. Left jugular central venous catheter with the tip projecting over the SVC. No acute osseous abnormality. IMPRESSION: Bilateral interstitial and alveolar airspace opacities concerning for multilobar pneumonia versus pulmonary edema. Left-sided PICC line with the tip projecting over the SVC. Left jugular central venous catheter with the tip projecting over the SVC. Electronically Signed   By: Kathreen Devoid   On: 04/18/2020 15:53    Medications: Infusions: . sodium chloride Stopped (04/17/20 1931)  . amiodarone 30 mg/hr (04/20/20 0700)  . ceFEPime (MAXIPIME) IV Stopped (04/19/20 2203)  . DOBUTamine 2.5 mcg/kg/min (04/20/20 0700)  . [START ON 04/23/2020] ferumoxytol    . heparin 1,400 Units/hr (04/20/20 0700)  . prismasol BGK 2/2.5 replacement solution    . prismasol BGK 2/2.5 replacement solution    . prismasol BGK 4/2.5 1,500 mL/hr at 04/20/20 0559    Scheduled Medications: . sodium chloride   Intravenous Once  . atorvastatin  40 mg Oral Daily  . Chlorhexidine Gluconate Cloth  6 each Topical Daily  . clonazePAM  0.5 mg Oral BID  . insulin aspart  0-20  Units Subcutaneous TID WC  . insulin aspart  10 Units Subcutaneous TID WC  . insulin glargine  45 Units Subcutaneous BID  . melatonin  3 mg Oral QHS  . multivitamin with minerals  1 tablet Oral Daily  .  sodium chloride flush  3 mL Intravenous Q12H    have reviewed scheduled and prn medications.  Physical Exam: General: Somnolent/lethargic male lying on bed, critically ill. Heart: Tachycardic, s1s2 nl Lungs: Coarse crackles bilateral, no increased work of breathing Abdomen: Distended abdomen and abdomen wall edema present Extremities: Lower extremities edema present. Neurology: Ashley Jacobs 04/20/2020,8:01 AM  LOS: 17 days

## 2020-04-20 NOTE — Progress Notes (Signed)
Patient's temperature would not read for nurse tech. Rectal temperature obtained which showed 95 degrees. Warming blanket and CRRT warmer have been on all shift. Room temperature increased. Patient feels fine. Will continue to monitor.

## 2020-04-20 NOTE — Progress Notes (Signed)
Patient refused CPAP for tonight. RT instructed patient to have RT called if he changes his mind. RT will monitor as needed. 

## 2020-04-21 DIAGNOSIS — A4150 Gram-negative sepsis, unspecified: Secondary | ICD-10-CM | POA: Diagnosis not present

## 2020-04-21 DIAGNOSIS — N186 End stage renal disease: Secondary | ICD-10-CM | POA: Diagnosis not present

## 2020-04-21 DIAGNOSIS — I4891 Unspecified atrial fibrillation: Secondary | ICD-10-CM | POA: Diagnosis not present

## 2020-04-21 DIAGNOSIS — J9601 Acute respiratory failure with hypoxia: Secondary | ICD-10-CM | POA: Diagnosis not present

## 2020-04-21 DIAGNOSIS — I5023 Acute on chronic systolic (congestive) heart failure: Secondary | ICD-10-CM | POA: Diagnosis not present

## 2020-04-21 DIAGNOSIS — E785 Hyperlipidemia, unspecified: Secondary | ICD-10-CM | POA: Diagnosis not present

## 2020-04-21 DIAGNOSIS — Z7189 Other specified counseling: Secondary | ICD-10-CM

## 2020-04-21 DIAGNOSIS — Z66 Do not resuscitate: Secondary | ICD-10-CM

## 2020-04-21 DIAGNOSIS — E1169 Type 2 diabetes mellitus with other specified complication: Secondary | ICD-10-CM | POA: Diagnosis not present

## 2020-04-21 LAB — GLUCOSE, CAPILLARY
Glucose-Capillary: >600 mg/dL (ref 70–99)
Glucose-Capillary: 212 mg/dL — ABNORMAL HIGH (ref 70–99)
Glucose-Capillary: 135 mg/dL — ABNORMAL HIGH (ref 70–99)

## 2020-04-21 LAB — RENAL FUNCTION PANEL
Albumin: 2.7 g/dL — ABNORMAL LOW (ref 3.5–5.0)
Anion gap: 11 (ref 5–15)
BUN: 25 mg/dL — ABNORMAL HIGH (ref 8–23)
CO2: 25 mmol/L (ref 22–32)
Calcium: 8.6 mg/dL — ABNORMAL LOW (ref 8.9–10.3)
Chloride: 96 mmol/L — ABNORMAL LOW (ref 98–111)
Creatinine, Ser: 2.27 mg/dL — ABNORMAL HIGH (ref 0.61–1.24)
GFR, Estimated: 31 mL/min — ABNORMAL LOW (ref >60)
Glucose, Bld: 215 mg/dL — ABNORMAL HIGH (ref 70–99)
Phosphorus: 3.9 mg/dL (ref 2.5–4.6)
Potassium: 5.1 mmol/L (ref 3.5–5.1)
Sodium: 132 mmol/L — ABNORMAL LOW (ref 135–145)

## 2020-04-21 LAB — MAGNESIUM: Magnesium: 2.7 mg/dL — ABNORMAL HIGH (ref 1.7–2.4)

## 2020-04-21 LAB — POCT ACTIVATED CLOTTING TIME
Activated Clotting Time: 166 seconds
Activated Clotting Time: 166 seconds

## 2020-04-21 LAB — HEPARIN LEVEL (UNFRACTIONATED): Heparin Unfractionated: 1.04 IU/mL — ABNORMAL HIGH (ref 0.30–0.70)

## 2020-04-21 LAB — COOXEMETRY PANEL
Carboxyhemoglobin: 1.1 % (ref 0.5–1.5)
Methemoglobin: 0.8 % (ref 0.0–1.5)
O2 Saturation: 66.5 %
Total hemoglobin: 9.2 g/dL — ABNORMAL LOW (ref 12.0–16.0)

## 2020-04-21 LAB — APTT: aPTT: 53 seconds — ABNORMAL HIGH (ref 24–36)

## 2020-04-21 MED ORDER — GLYCOPYRROLATE 0.2 MG/ML IJ SOLN: 0.2000 mg | INTRAMUSCULAR | Status: DC

## 2020-04-21 MED ORDER — GLYCOPYRROLATE 0.2 MG/ML IJ SOLN: 0.2000 mg | INTRAMUSCULAR | Status: DC | PRN

## 2020-04-21 MED ORDER — HYDROMORPHONE HCL 1 MG/ML IJ SOLN: 0.5000 mg | INTRAMUSCULAR | Status: DC

## 2020-04-21 MED ORDER — DIPHENHYDRAMINE HCL 50 MG/ML IJ SOLN
12.5000 mg | Freq: Four times a day (QID) | INTRAMUSCULAR | Status: DC | PRN
  Administered 2020-04-21: 12.5 mg via INTRAVENOUS
  Filled 2020-04-21: qty 1

## 2020-04-21 MED ORDER — SCOPOLAMINE 1 MG/3DAYS TD PT72
1.0000 | MEDICATED_PATCH | TRANSDERMAL | Status: DC
  Filled 2020-04-21: qty 1

## 2020-04-21 MED ORDER — HYDROMORPHONE BOLUS VIA INFUSION
0.5000 mg | INTRAVENOUS | Status: DC | PRN
  Filled 2020-04-21: qty 1

## 2020-04-21 MED ORDER — SODIUM CHLORIDE 0.9 % IV SOLN
1.0000 mg/h | INTRAVENOUS | Status: DC
  Administered 2020-04-21: 1 mg/h via INTRAVENOUS
  Filled 2020-04-21: qty 2.5

## 2020-04-21 MED ORDER — HYDROMORPHONE HCL 1 MG/ML IJ SOLN
0.5000 mg | INTRAMUSCULAR | Status: DC | PRN
Start: 2020-04-21 — End: 2020-04-21

## 2020-04-21 MED ORDER — LORAZEPAM 2 MG/ML IJ SOLN: 1.0000 mg | INTRAMUSCULAR | Status: DC | PRN

## 2020-04-21 MED ORDER — HYDROMORPHONE HCL 1 MG/ML IJ SOLN
1.0000 mg | INTRAMUSCULAR | Status: DC | PRN
Start: 2020-04-21 — End: 2020-04-22
  Administered 2020-04-21: 1 mg via INTRAVENOUS
  Filled 2020-04-21: qty 1

## 2020-04-21 MED ORDER — LORAZEPAM 2 MG/ML IJ SOLN
0.5000 mg | INTRAMUSCULAR | Status: DC
  Administered 2020-04-21: 0.5 mg via INTRAVENOUS
  Filled 2020-04-21: qty 1

## 2020-04-21 NOTE — Progress Notes (Signed)
This chaplain responded to MD consult for spiritual care and Pt. prayer.  The Pt. is alert and communicating with a soft voice.  The Pt. spiritual friend-[facility] is bedside.  The Pt. remains focused and has communicated his desire to get out of the bed and sit in a chair.  [facility] is navigating the Pt. requests with the assistance of the RN.   The chaplain understands the Pt. spirituality provides a trusting, comfortable relationship with the Pt. Lord Development worker, community.  However, the Pt. finds unrest and is waiting on his grand daughter's salvation.  The Pt. looks forward to [place] and other family visiting in the near future.  The Pt. accepted the chaplain's invitation for F/U spiritual care.

## 2020-04-21 NOTE — Progress Notes (Signed)
OT Cancellation Note  Patient Details Name: [facility] MRN: [id number] DOB: 1951-01-19   Cancelled Treatment:    Reason Eval/Treat Not Completed:  (Plan is to transition to comfort care. Signing off.)  Malka So 04/08/2020, 10:55 AM  Nestor Lewandowsky, OTR/L Acute Rehabilitation Services Pager: [phone] Office: [phone]

## 2020-04-21 NOTE — Progress Notes (Signed)
PT Cancellation Note  Patient Details Name: [facility] MRN: [id number] DOB: 1950-03-27   Cancelled Treatment:    Reason Eval/Treat Not Completed: (P) Medical issues which prohibited therapy. Pt is being moved to comfort care this afternoon. PT is signing off.   Makalah Asberry B. Migdalia Dk PT, DPT Acute Rehabilitation Services Pager [phone] Office [phone]    [place] 05/05/2020, 10:34 AM

## 2020-04-21 NOTE — Progress Notes (Addendum)
[facility] telemetry noted bradycardia/junctional rates 20-30s with pulse ox 70s.  Heart activity progressed to asystole on telemetry.  I auscultated heart and breath sounds each for one full minute without activity. No radial pulse or response. Time of death 21:50. Significant other is present at bedside. Updated on status.

## 2020-04-21 NOTE — Progress Notes (Addendum)
Advanced Heart Failure Rounding Note   Subjective:    2/7 febrile. Bld Cx 2/2 Serratia Marcescens 2/11 moved to ICU for CVVHD  2/13 overnight AMS, ABG 7.22/67/69/30, placed on bipap  Remains on dobutamine 2.5, NE 8 and CVVHD pulling 200/H. CVP 18, co-ox 67%.  Alert and oriented.  Did receive a dose of ativan overnight d/t agitation.   Objective:   Weight Range:  Vital Signs:   Temp:  [95 F (35 C)-97.7 F (36.5 C)] 97.6 F (36.4 C) (02/14 0400) Pulse Rate:  [82-110] 97 (02/14 0600) Resp:  [13-29] 18 (02/14 0600) BP: (75-136)/(11-112) 105/74 (02/14 0600) SpO2:  [86 %-100 %] 100 % (02/14 0600) FiO2 (%):  [50 %] 50 % (02/13 0640) Weight:  [135.9 kg] 135.9 kg (02/13 0625) Last BM Date: 04/17/20  Weight change: Filed Weights   04/18/20 0349 04/19/20 0405 04/20/20 0625  Weight: (!) 139.1 kg (!) 140.9 kg 135.9 kg    Intake/Output:   Intake/Output Summary (Last 24 hours) at 04/27/2020 0612 Last data filed at 04/15/2020 0500 Gross per 24 hour  Intake 1280.38 ml  Output 6455 ml  Net -5174.62 ml    PHYSICAL EXAM: CVP 18 General:  Chronic ill appearing HEENT: normal Neck: large supple neck Cor: Iregular rate & rhythm. No rubs, gallops or murmurs. Lungs: Coarse Abdomen: soft, nontender, + distended. Good bowel sounds. Extremities: no cyanosis, clubbing, rash.  + BLE edema Neuro: alert, moves extremities w/o difficulty.   Telemetry:  afib with rates in the 90s.  Personally reviewed.    Labs: Basic Metabolic Panel: Recent Labs  Lab 04/15/20 0429 04/15/20 0929 04/16/20 0500 04/16/20 1136 04/19/20 0422 04/19/20 1535 04/20/20 0227 04/20/20 0351 04/20/20 0403 04/20/20 0404 04/20/20 1523 04/19/2020 0413  NA 129*   < >  --    < > 126* 129* 133* 133*  --  128* 130* 132*  K 4.8   < >  --    < > 4.5 4.7 4.9 5.3*  --  5.0 5.0 5.1  CL 89*   < >  --    < > 90* 92*  --   --   --  91* 96* 96*  CO2 29   < >  --    < > 24 25  --   --   --  '26 24 25  '$ GLUCOSE 215*   <  >  --    < > 349* 300*  --   --   --  271* 252* 215*  BUN 46*   < >  --    < > 63* 49*  --   --   --  37* 29* 25*  CREATININE 2.07*   < >  --    < > 3.76* 3.16*  --   --   --  2.63* 2.30* 2.27*  CALCIUM 8.4*   < >  --    < > 8.2* 8.2*  --   --   --  8.7* 8.3* 8.6*  MG 2.4  --  2.6*  --  2.7*  --   --   --  2.8*  --   --  2.7*  PHOS  --   --   --    < > 3.6 3.6  --   --   --  3.9 3.9 3.9   < > = values in this interval not displayed.    Liver Function Tests: Recent Labs  Lab 04/15/20 0429 04/18/20 1621 04/19/20  0422 04/19/20 1535 04/20/20 0404 04/20/20 1523 04/29/2020 0413  AST 28  --   --   --   --   --   --   ALT 22  --   --   --   --   --   --   ALKPHOS 71  --   --   --   --   --   --   BILITOT 1.0  --   --   --   --   --   --   PROT 6.4*  --   --   --   --   --   --   ALBUMIN 2.7*   < > 2.4* 2.5* 2.6* 2.6* 2.7*   < > = values in this interval not displayed.   No results for input(s): LIPASE, AMYLASE in the last 168 hours. No results for input(s): AMMONIA in the last 168 hours.  CBC: Recent Labs  Lab 04/14/20 1433 04/15/20 0429 04/17/20 0413 04/17/20 1700 04/18/20 0411 04/19/20 0030 04/19/20 0422 04/20/20 0227 04/20/20 0351 04/20/20 0403  WBC 8.3   < > 11.0* 13.3* 11.6*  --  11.0*  --   --  12.0*  NEUTROABS 6.5  --   --   --   --   --   --   --   --   --   HGB 10.0*   < > 7.7* 8.2* 7.2* 7.8* 8.0* 10.5* 10.9* 8.5*  HCT 34.7*   < > 25.3* 26.6* 23.9* 25.6* 26.4* 31.0* 32.0* 28.5*  MCV 82.4   < > 79.6* 78.0* 78.4*  --  78.6*  --   --  81.2  PLT 399   < > 330 419* 382  --  377  --   --  393   < > = values in this interval not displayed.    Cardiac Enzymes: No results for input(s): CKTOTAL, CKMB, CKMBINDEX, TROPONINI in the last 168 hours.  BNP: BNP (last 3 results) Recent Labs    12/10/19 0057 02/13/20 0807 03/11/2020 2019  BNP 154.7* 111.6* 195.0*    ProBNP (last 3 results) No results for input(s): PROBNP in the last 8760 hours.    Other  results:  Imaging: No results found.   Medications:     Scheduled Medications: . sodium chloride   Intravenous Once  . atorvastatin  40 mg Oral Daily  . busPIRone  7.5 mg Oral BID  . Chlorhexidine Gluconate Cloth  6 each Topical Daily  . clonazePAM  1 mg Oral BID  . insulin aspart  0-15 Units Subcutaneous TID WC  . insulin glargine  45 Units Subcutaneous BID  . melatonin  3 mg Oral QHS  . multivitamin with minerals  1 tablet Oral Daily  . sodium chloride flush  3 mL Intravenous Q12H    Infusions: . sodium chloride Stopped (04/17/20 1931)  . amiodarone 30 mg/hr (04/14/2020 0500)  . ceFEPime (MAXIPIME) IV Stopped (04/20/20 2149)  . DOBUTamine 2.5 mcg/kg/min (05/02/2020 0500)  . [START ON 04/23/2020] ferumoxytol    . heparin 1,600 Units/hr (04/12/2020 0500)  . norepinephrine (LEVOPHED) Adult infusion 8 mcg/min (04/26/2020 0500)  . prismasol BGK 2/2.5 replacement solution 500 mL/hr at 05/03/2020 0247  . prismasol BGK 2/2.5 replacement solution 300 mL/hr at 04/16/2020 0556  . prismasol BGK 4/2.5 1,500 mL/hr at 04/20/2020 0451    PRN Medications: sodium chloride, acetaminophen **OR** acetaminophen, alum & mag hydroxide-simeth, [COMPLETED] diphenhydrAMINE **FOLLOWED BY** diphenhydrAMINE, heparin, hydrALAZINE, hydrocortisone cream, hydrOXYzine,  LORazepam, Muscle Rub, ondansetron (ZOFRAN) IV, polyethylene glycol, sodium chloride flush, traMADol   Assessment/Plan:   1.  Acute on chronic systolic heart failure - Echo (11/2018) EF 25-30%, trace MR, trivial TR, moderately reduced RSVF.   - Cath (12/2018) RA 10, RV 55/8, PA 54/15 (27), PCW = 16, Fick CO/CI = 7.0/2.8 - Echo (04/04/2020) EF 45-50%, mild LVH, hypokinesis LV, dilated LA, dilated RV/RA, mod MR, mod-severe TR, RVSP > 60 mmHg.  - Milrinone started on 1/29 for low CO-OX. Switched to [place] on 2/11 - Todays CO-OX 67 % on dobutamine 2.5 and NE 8 with CVP 18.  - CVVHD started 2/11 w/ fluid removal ~ 200/hr.  - Palliative consulted,  recommendations appreciated.  - Renal recommendations appreciated.  - Holding [place] and dig for now.  - Hold spiro for now given AKI.  - PRN hydralazine for HTN   2. Persistent AF - has been on amio at home - suspect has been in AF for several weeks. ECG in 12/21 was NSR - Last week he went back in NSR but over the weekend back in A fib RVR.  - Will need cardioversion, once infection clears.  - Continue IV amio and heparin  3. Gram-negative (serratia marcescens) sepsis - 2/7 Fever. Lactic Acid 2.2>1.2  - 2/7Bld Cx 2/2-Serratia Marcescens - On cefepime. ID following  4. Acute on chronic hypoxic respiratory failure/OSA  - needs volume removal - can use CPAP as needed - Overnight on 2/13, ABG showed elevated PaCO2, placed on bipap.  - He is DNR  5. AKI on CKD, Stage 3b -> ESRD - Baseline Cr 1.5-1.7 - CVVHD > 2.6 - Renal u/s no acute findings.  - Change milrinone for DBA.  - CVVHD started 2/11 - not candidate for outpatient HD  6. Venous stasis wounds L tib/fib, L heel & Sacral wound - Wound care consulted. Primary team managing   7. T2DM - A1c 12.4% recent hospitalization - SSI and lantus Per primary team - Would be concerned about SGT2i with mild erythema under pannus  8. Morbidly obese Body mass index is 46.92 kg/m.  9. Lung nodule - Will need outpatient imaging in 3 months, 9 mm CT (03/2020)  10. Hypokalemia/ Hypomagnesemia -K /Mag stable.  -Keep K> 4.0 Mg > 2.0   11. Hypervolemic Hyponatremia -Corrected Sodium 134. Restrict free water.   -CVVHD  12. Anemia/Hx of GIB - Hgb 8.5. No obvious source of bleeding  - EGD 12/2019 nonbleeding duodenal ulcer with gastritis - Received Feraheme 04/09/20  - monitor closely, defer transfusion to IM   13. Altered mental status 2/13: ABG showed hypercapnia, placed on bipap x 1 hour no change in hypercapnia -Alert and oriented to place and events.   -continue to monitor for now  14. DNR/DNI  Length of Stay:  Noel NP 04/16/2020, 6:12 AM  Advanced Heart Failure Team Pager [phone] (M-F; 7a - 4p)  Please contact [facility] for night-coverage after hours (4p -7a ) and weekends on [email or web address]  Agree with above.   On dual pressors and CVVHD. Very lethargic. Anuric. CVP remains high.   General:  Lethargic. Will not arouse easily HEENT: normal Neck: supple. JVP up. Left IJ HD cath Carotids 2+ bilat; no bruits. No lymphadenopathy or thryomegaly appreciated. Cor: PMI nondisplaced. Irregular rate & rhythm. No rubs, gallops or murmurs. Lungs: clear Abdomen: obese soft, nontender, + distended. No hepatosplenomegaly. No bruits or masses. Good bowel sounds. Extremities: no cyanosis, clubbing, rash, 2-3+ edema  Neuro: lethargic minimally arousable  He is end-stage with multisystem organ failure. Remains on pressors.. No evidence of renal recovery.   Results of family meeting yesterday reviewed. Anticipate transition to comfort care today.   CRITICAL CARE Performed by: [name]  Total critical care time: 35 minutes  Critical care time was exclusive of separately billable procedures and treating other patients.  Critical care was necessary to treat or prevent imminent or life-threatening deterioration.  Critical care was time spent personally by me (independent of midlevel providers or residents) on the following activities: development of treatment plan with patient and/or surrogate as well as nursing, discussions with consultants, evaluation of patient's response to treatment, examination of patient, obtaining history from patient or surrogate, ordering and performing treatments and interventions, ordering and review of laboratory studies, ordering and review of radiographic studies, pulse oximetry and re-evaluation of patient's condition.  [name], MD  8:59 AM

## 2020-04-21 NOTE — Progress Notes (Signed)
[facility] ROUNDING NOTE   Subjective:   Interval History: This is a 70 year old gentleman with a history of congestive heart failure atrial fibrillation diabetes mellitus type 2, morbid obesity.  Was admitted with decompensated congestive heart failure 03/09/2020.  Being managed with CRRT since 04/18/2020.  Plans are for transition to comfort care  Blood pressure 105/28 pulse 94 temperature 97.5 O2 sats 98% 7 L high flow nasal cannula  Sodium 132 potassium 5.1 chloride 96 CO2 25 BUN 25 creatinine 2.25 glucose 215 calcium 8.6 phosphorus 3.9 magnesium 2.7 hemoglobin 8.5   Objective:  Vital signs in last 24 hours:  Temp:  [95 F (35 C)-97.7 F (36.5 C)] 97.5 F (36.4 C) (02/14 0700) Pulse Rate:  [82-110] 94 (02/14 0700) Resp:  [13-29] 18 (02/14 0700) BP: (75-136)/(11-112) 94/71 (02/14 0700) SpO2:  [88 %-100 %] 97 % (02/14 0700) Weight:  [129.5 kg] 129.5 kg (02/14 0615)  Weight change: -6.4 kg Filed Weights   04/19/20 0405 04/20/20 0625 05/04/2020 0615  Weight: (!) 140.9 kg 135.9 kg 129.5 kg    Intake/Output: I/O last 3 completed shifts: In: 1855.3 [P.O.:112; I.V.:1443.3; IV Piggyback:300] Out: [id number] [Urine:10; [id number]   Intake/Output this shift:  Total I/O In: 88.2 [I.V.:88.2] Out: 290 [Other:290]  Chronically ill-appearing gentleman CVS-regular rate and rhythm no murmurs rubs or gallops large neck with elevated JVP RS-coarse lung sounds throughout ABD- BS present soft non-distended EXT-bilateral lower extremity edema   Basic Metabolic Panel: Recent Labs  Lab 04/15/20 0429 04/15/20 0929 04/16/20 0500 04/16/20 1136 04/19/20 0422 04/19/20 1535 04/20/20 0227 04/20/20 0351 04/20/20 0403 04/20/20 0404 04/20/20 1523 04/11/2020 0413  NA 129*   < >  --    < > 126* 129* 133* 133*  --  128* 130* 132*  K 4.8   < >  --    < > 4.5 4.7 4.9 5.3*  --  5.0 5.0 5.1  CL 89*   < >  --    < > 90* 92*  --   --   --  91* 96* 96*  CO2 29   < >  --    < > 24 25   --   --   --  '26 24 25  '$ GLUCOSE 215*   < >  --    < > 349* 300*  --   --   --  271* 252* 215*  BUN 46*   < >  --    < > 63* 49*  --   --   --  37* 29* 25*  CREATININE 2.07*   < >  --    < > 3.76* 3.16*  --   --   --  2.63* 2.30* 2.27*  CALCIUM 8.4*   < >  --    < > 8.2* 8.2*  --   --   --  8.7* 8.3* 8.6*  MG 2.4  --  2.6*  --  2.7*  --   --   --  2.8*  --   --  2.7*  PHOS  --   --   --    < > 3.6 3.6  --   --   --  3.9 3.9 3.9   < > = values in this interval not displayed.    Liver Function Tests: Recent Labs  Lab 04/15/20 0429 04/18/20 1621 04/19/20 0422 04/19/20 1535 04/20/20 0404 04/20/20 1523 04/27/2020 0413  AST 28  --   --   --   --   --   --  ALT 22  --   --   --   --   --   --   ALKPHOS 71  --   --   --   --   --   --   BILITOT 1.0  --   --   --   --   --   --   PROT 6.4*  --   --   --   --   --   --   ALBUMIN 2.7*   < > 2.4* 2.5* 2.6* 2.6* 2.7*   < > = values in this interval not displayed.   No results for input(s): LIPASE, AMYLASE in the last 168 hours. No results for input(s): AMMONIA in the last 168 hours.  CBC: Recent Labs  Lab 04/14/20 1433 04/15/20 0429 04/17/20 0413 04/17/20 1700 04/18/20 0411 04/19/20 0030 04/19/20 0422 04/20/20 0227 04/20/20 0351 04/20/20 0403  WBC 8.3   < > 11.0* 13.3* 11.6*  --  11.0*  --   --  12.0*  NEUTROABS 6.5  --   --   --   --   --   --   --   --   --   HGB 10.0*   < > 7.7* 8.2* 7.2* 7.8* 8.0* 10.5* 10.9* 8.5*  HCT 34.7*   < > 25.3* 26.6* 23.9* 25.6* 26.4* 31.0* 32.0* 28.5*  MCV 82.4   < > 79.6* 78.0* 78.4*  --  78.6*  --   --  81.2  PLT 399   < > 330 419* 382  --  377  --   --  393   < > = values in this interval not displayed.    Cardiac Enzymes: No results for input(s): CKTOTAL, CKMB, CKMBINDEX, TROPONINI in the last 168 hours.  BNP: Invalid input(s): POCBNP  CBG: Recent Labs  Lab 04/20/20 1117 04/20/20 1545 04/20/20 2008 04/20/20 2156 04/20/2020 0655  GLUCAP 105* 100* 201* 210* 212*     Microbiology: Results for orders placed or performed during the hospital encounter of 04/02/2020  SARS Coronavirus 2 by RT PCR (hospital order, performed in [facility] hospital lab) Nasopharyngeal Nasopharyngeal Swab     Status: None   Collection Time: 03/16/2020  9:51 PM   Specimen: Nasopharyngeal Swab  Result Value Ref Range Status   SARS Coronavirus 2 NEGATIVE NEGATIVE Final    Comment: Performed at [facility], [place] [address] [place], [place] [address]  Culture, blood (routine x 2)     Status: Abnormal   Collection Time: 04/14/20  9:37 AM   Specimen: BLOOD  Result Value Ref Range Status   Specimen Description BLOOD RIGHT ANTECUBITAL  Final   Special Requests   Final    BOTTLES DRAWN AEROBIC AND ANAEROBIC Blood Culture results may not be optimal due to an inadequate volume of blood received in culture bottles Performed at [place] [address], [place],  [address]    Culture  Setup Time   Final    CORRECTED RESULTS GRAM NEGATIVE RODS PREVIOUSLY REPORTED AS: GRAM NEGATIVE COCCI CORRECTED RESULTS CALLED TO: Justice Britain CHEN [id number] FCP    Culture SERRATIA MARCESCENS (A)  Final   Report Status 04/17/2020 FINAL  Final   Organism ID, Bacteria SERRATIA MARCESCENS  Final      Susceptibility   Serratia marcescens - MIC*    CEFAZOLIN >=64 RESISTANT Resistant     CEFEPIME <=0.12 SENSITIVE Sensitive     CEFTAZIDIME <=1 SENSITIVE Sensitive  CEFTRIAXONE <=0.25 SENSITIVE Sensitive     CIPROFLOXACIN <=0.25 SENSITIVE Sensitive     GENTAMICIN <=1 SENSITIVE Sensitive     TRIMETH/SULFA <=20 SENSITIVE Sensitive     * SERRATIA MARCESCENS  Blood Culture ID Panel (Reflexed)     Status: Abnormal   Collection Time: 04/14/20  9:37 AM  Result Value Ref Range Status   Enterococcus faecalis NOT DETECTED NOT DETECTED Final   Enterococcus Faecium NOT DETECTED NOT DETECTED Final   Listeria monocytogenes NOT DETECTED NOT DETECTED Final   Staphylococcus species NOT  DETECTED NOT DETECTED Final   Staphylococcus aureus (BCID) NOT DETECTED NOT DETECTED Final   Staphylococcus epidermidis NOT DETECTED NOT DETECTED Final   Staphylococcus lugdunensis NOT DETECTED NOT DETECTED Final   Streptococcus species NOT DETECTED NOT DETECTED Final   Streptococcus agalactiae NOT DETECTED NOT DETECTED Final   Streptococcus pneumoniae NOT DETECTED NOT DETECTED Final   Streptococcus pyogenes NOT DETECTED NOT DETECTED Final   A.calcoaceticus-baumannii NOT DETECTED NOT DETECTED Final   Bacteroides fragilis NOT DETECTED NOT DETECTED Final   Enterobacterales DETECTED (A) NOT DETECTED Final    Comment: Enterobacterales represent a large order of gram negative bacteria, not a single organism. CRITICAL RESULT CALLED TO, READ BACK BY AND VERIFIED WITH: [place] ON  04/15/2020    Enterobacter cloacae complex NOT DETECTED NOT DETECTED Final   Escherichia coli NOT DETECTED NOT DETECTED Final   Klebsiella aerogenes NOT DETECTED NOT DETECTED Final   Klebsiella oxytoca NOT DETECTED NOT DETECTED Final   Klebsiella pneumoniae NOT DETECTED NOT DETECTED Final   Proteus species NOT DETECTED NOT DETECTED Final   Salmonella species NOT DETECTED NOT DETECTED Final   Serratia marcescens DETECTED (A) NOT DETECTED Final    Comment: CRITICAL RESULT CALLED TO, READ BACK BY AND VERIFIED WITH: PHARMD AMEND C. BY MESSAN H. AT 0320 ON  04/15/2020    Haemophilus influenzae NOT DETECTED NOT DETECTED Final   Neisseria meningitidis NOT DETECTED NOT DETECTED Final   Pseudomonas aeruginosa NOT DETECTED NOT DETECTED Final   Stenotrophomonas maltophilia NOT DETECTED NOT DETECTED Final   Candida albicans NOT DETECTED NOT DETECTED Final   Candida auris NOT DETECTED NOT DETECTED Final   Candida glabrata NOT DETECTED NOT DETECTED Final   Candida krusei NOT DETECTED NOT DETECTED Final   Candida parapsilosis NOT DETECTED NOT DETECTED Final   Candida tropicalis NOT DETECTED NOT DETECTED  Final   Cryptococcus neoformans/gattii NOT DETECTED NOT DETECTED Final   CTX-M ESBL NOT DETECTED NOT DETECTED Final   Carbapenem resistance IMP NOT DETECTED NOT DETECTED Final   Carbapenem resistance KPC NOT DETECTED NOT DETECTED Final   Carbapenem resistance NDM NOT DETECTED NOT DETECTED Final   Carbapenem resist OXA 48 LIKE NOT DETECTED NOT DETECTED Final   Carbapenem resistance VIM NOT DETECTED NOT DETECTED Final    Comment: Performed at [facility], [address] [place], [place] [address]  Culture, blood (routine x 2)     Status: Abnormal   Collection Time: 04/14/20  9:42 AM   Specimen: BLOOD RIGHT HAND  Result Value Ref Range Status   Specimen Description BLOOD RIGHT HAND  Final   Special Requests   Final    BOTTLES DRAWN AEROBIC AND ANAEROBIC Blood Culture results may not be optimal due to an inadequate volume of blood received in culture bottles   Culture  Setup Time   Final    CORRECTED RESULTS GRAM NEGATIVE RODS PREVIOUSLY REPORTED AS: GRAM NEGATIVE COCCI  CORRECTED RESULTS CALLED TO: PHARMD LYDIA CHEN [id number] FCP    Culture (A)  Final    SERRATIA MARCESCENS SUSCEPTIBILITIES PERFORMED ON PREVIOUS CULTURE WITHIN THE LAST 5 DAYS. Performed at [facility], [place] [address], [place], [place] [address]    Report Status 04/17/2020 FINAL  Final  Culture, Urine     Status: Abnormal   Collection Time: 04/17/20  1:05 PM   Specimen: Urine, Random  Result Value Ref Range Status   Specimen Description URINE, RANDOM  Final   Special Requests NONE  Final   Culture (A)  Final    <10,000 COLONIES/mL INSIGNIFICANT GROWTH Performed at [facility], [place] [address], [place], [place] [address]    Report Status 04/18/2020 FINAL  Final  Culture, blood (Routine X 2) w Reflex to ID Panel     Status: None (Preliminary result)   Collection Time: 04/17/20  2:16 PM   Specimen: BLOOD RIGHT HAND  Result Value Ref Range Status   Specimen Description BLOOD RIGHT HAND  Final    Special Requests   Final    BOTTLES DRAWN AEROBIC AND ANAEROBIC Blood Culture adequate volume   Culture   Final    NO GROWTH 3 DAYS Performed at [facility], [place] [address], [place], [place] [address]    Report Status PENDING  Incomplete  Culture, blood (Routine X 2) w Reflex to ID Panel     Status: None (Preliminary result)   Collection Time: 04/17/20  2:17 PM   Specimen: BLOOD  Result Value Ref Range Status   Specimen Description BLOOD RIGHT ANTECUBITAL  Final   Special Requests   Final    BOTTLES DRAWN AEROBIC AND ANAEROBIC Blood Culture results may not be optimal due to an inadequate volume of blood received in culture bottles   Culture   Final    NO GROWTH 3 DAYS Performed at [facility], [place] [address], [place], [place] [address]    Report Status PENDING  Incomplete  MRSA PCR Screening     Status: None   Collection Time: 04/18/20  2:39 PM   Specimen: Nasopharyngeal  Result Value Ref Range Status   MRSA by PCR NEGATIVE NEGATIVE Final    Comment:        The GeneXpert MRSA Assay (FDA approved for NASAL specimens only), is one component of a comprehensive MRSA colonization surveillance program. It is not intended to diagnose MRSA infection nor to guide or monitor treatment for MRSA infections. Performed at [facility], [place] [address] [place], [place] [address]     Coagulation Studies: No results for input(s): LABPROT, INR in the last 72 hours.  Urinalysis: No results for input(s): COLORURINE, LABSPEC, PHURINE, GLUCOSEU, HGBUR, BILIRUBINUR, KETONESUR, PROTEINUR, UROBILINOGEN, NITRITE, LEUKOCYTESUR in the last 72 hours.  Invalid input(s): APPERANCEUR    Imaging: No results found.   Medications:   . sodium chloride Stopped (04/17/20 1931)  . amiodarone 30 mg/hr (05/05/2020 0900)  . ceFEPime (MAXIPIME) IV Stopped (04/20/20 2149)  . DOBUTamine 2.5 mcg/kg/min (04/15/2020 0900)  . [START ON 04/23/2020] ferumoxytol    . heparin 1,750 Units/hr  (05/01/2020 0900)  . norepinephrine (LEVOPHED) Adult infusion 7 mcg/min (04/17/2020 0900)  . prismasol BGK 2/2.5 replacement solution 500 mL/hr at 04/16/2020 0247  . prismasol BGK 2/2.5 replacement solution 300 mL/hr at 04/20/2020 0556  . prismasol BGK 4/2.5 1,500 mL/hr at 05/03/2020 0759   . sodium chloride   Intravenous Once  . atorvastatin  40 mg Oral Daily  . busPIRone  7.5 mg Oral BID  . Chlorhexidine Gluconate Cloth  6 each Topical Daily  . clonazePAM  1 mg Oral BID  . insulin aspart  0-15 Units Subcutaneous TID WC  . insulin glargine  45 Units Subcutaneous BID  . melatonin  3 mg Oral QHS  . multivitamin with minerals  1 tablet Oral Daily  . sodium chloride flush  3 mL Intravenous Q12H   sodium chloride, acetaminophen **OR** acetaminophen, alum & mag hydroxide-simeth, [COMPLETED] diphenhydrAMINE **FOLLOWED BY** diphenhydrAMINE, heparin, hydrALAZINE, hydrocortisone cream, hydrOXYzine, LORazepam, Muscle Rub, ondansetron (ZOFRAN) IV, polyethylene glycol, sodium chloride flush, traMADol  Assessment/ Plan:  1.Acute on chronic systolic CHF: Decompensated heart failure.Volume management via CRRT.  On dobutamine for inotropic support.   2.Acute kidney injury on CKD 3 due to cardiorenal syndrome/bacteremia.  Refractory to diuretics with inotropic support.  He is massively fluid overload.  Initiated CRRT on 2/11 for ultrafiltration.  May need pressors to help with ultrafiltration.  Currently 150 cc an hour.  Not much clinical improvement with CRRT so far and he has multiorgan failure.  Appreciate input from palliative medicine The CRRT bath changed to 2K pre and post filter and continue 4K dialysate, IV heparin for systemic anticoagulation.    3.Serratia bacteremia: Currently on cefepime.  ID following.  4.Hyponatremia, hypervolemic: Now managed with CRRT.  Monitor lab.  5.A. fib with RVR: Tachycardic.  Currently on amiodarone IV and Eliquis.  6.Anemia of critical illness/renal failure:  Received iron.  ESA on 2/11.  Monitor hemoglobin and transfuse as needed.  7.Acute encephalopathy: Multifactorial including metabolic, CO2 narcosis: Refusing BiPAP.  Patient is quite somnolent this morning    LOS: [place] '@TODAY''@9'$ :24 AM
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
## 2020-04-21 NOTE — Progress Notes (Addendum)
Daily Progress Note   Patient Name: Raymond Pratt       Date: 04/20/2020 DOB: [birth year]  Age: 70 y.o. MRN#: [id number] Attending Physician: [name], MD Primary Care Physician: Shon Baton, MD Admit Date: 03/18/2020  Reason for Consultation/Follow-up: Establishing goals of care and Terminal Care  Subjective: Patient lethargic. Will open eyes to voice but falls right back to sleep. He does nod head no when asked if in pain. Vitals stable but currently remains on CRRT, NE, and dobutamine gtts. No s/s of pain or distress.   GOC:  [id number]: F/u with patient's documented HCPOA's at bedside Raymond Pratt, friend and Raymond Pratt, daughter). Patient is slightly more awake than this morning. Reviewed in detail course of hospitalization including diagnoses, interventions, plan of care, and unfortunate poor prognosis due to multiorgan failure and lack of improvement despite aggressive medical management. Discussed poor candidacy for intermittent/outpatient dialysis. Reviewed patient's documented living will and confirmed he would not wish for his life to be prolonged if terminal or incurable.   Patient drowsy throughout discussion but it does seem he has some sort of understanding that he is very ill. He states "50/50 chance" and Raymond Pratt shares this is what the doctors expressed to him a few days ago. She shares that earlier, Raymond Pratt made a comment about knowing this was coming but being surprised by how fast it has come. Raymond Pratt does share her belief that he understands he is dying.   Discussed medical recommendation from multiple specialists for shift to comfort focused pathway including discontinuation of life prolonging measures (CRRT/gtts). Emphasized focus on comfort care, symptom management, allowing  family to visit, and allowing nature to take course with his condition.   Raymond Pratt does not say much, but again he is very drowsy. Raymond Pratt and Raymond Pratt understand and agree with shift to comfort measures. There are other family/friends that wish to visit this afternoon. We discussed unrestricted visitor access this afternoon and if they are ready for removal of life-prolonging interventions, we can proceed with this later this afternoon. Discussed initiation of comfort meds now if he needs them. Raymond Pratt and Raymond Pratt agree. PMT provider will f/u with family at 71 to further discuss full transition to comfort.   Discussed with RN. She will allow family to visit. Comfort meds on MAR as needed.    ADDENDUM 1640-1710:  F/u with family at bedside including daughter/HCPOA Raymond Pratt) and friend/HCPOA Raymond Pratt) at bedside. Many family members and church friends have visited throughout the day. HCPOA's are ready for shift to comfort measures and understand that interventions not aimed at comfort will be discontinued. They are prepared that he may decline quickly once interventions are discontinued. Emphasized focus on comfort, symptom management, and peace/dignity as he nears EOL. Discussed unrestricted visitor access. Answered questions.   Updated RN, Raymond Pratt. Will place sign and held order for dilaudid infusion if patient uncomfortable despite scheduled ativan and dilaudid.    Length of Stay: 18  Current Medications: Scheduled Meds:  . sodium chloride   Intravenous Once  . atorvastatin  40 mg Oral Daily  . busPIRone  7.5 mg Oral BID  . Chlorhexidine Gluconate Cloth  6 each Topical Daily  . clonazePAM  1 mg Oral BID  . insulin aspart  0-15 Units Subcutaneous TID WC  . insulin glargine  45 Units Subcutaneous BID  . melatonin  3 mg Oral QHS  . multivitamin with minerals  1 tablet Oral Daily  . sodium chloride flush  3 mL Intravenous Q12H    Continuous Infusions: . sodium chloride Stopped (04/17/20 1931)   . amiodarone 30 mg/hr (04/19/2020 0900)  . ceFEPime (MAXIPIME) IV Stopped (04/20/20 2149)  . DOBUTamine 2.5 mcg/kg/min (04/08/2020 0900)  . [START ON 04/23/2020] ferumoxytol    . heparin 1,750 Units/hr (04/29/2020 0900)  . norepinephrine (LEVOPHED) Adult infusion 7 mcg/min (04/28/2020 0900)  . prismasol BGK 2/2.5 replacement solution 500 mL/hr at 04/30/2020 0247  . prismasol BGK 2/2.5 replacement solution 300 mL/hr at 04/12/2020 0556  . prismasol BGK 4/2.5 1,500 mL/hr at 05/03/2020 0759    PRN Meds: sodium chloride, acetaminophen **OR** acetaminophen, alum & mag hydroxide-simeth, [COMPLETED] diphenhydrAMINE **FOLLOWED BY** diphenhydrAMINE, heparin, hydrALAZINE, hydrocortisone cream, hydrOXYzine, LORazepam, Muscle Rub, ondansetron (ZOFRAN) IV, polyethylene glycol, sodium chloride flush, traMADol  Physical Exam Vitals and nursing note reviewed.  Constitutional:      Appearance: He is ill-appearing.  HENT:     Head: Normocephalic and atraumatic.  Cardiovascular:     Rate and Rhythm: Rhythm irregularly irregular.     Comments: amio gtt Pulmonary:     Effort: No tachypnea, accessory muscle usage or respiratory distress.     Comments: 7L HFNC Skin:    General: Skin is warm and dry.     Comments: anasarca  Neurological:     Mental Status: He is lethargic.     Comments: Not following commands  Psychiatric:        Attention and Perception: He is inattentive.        Speech: He is noncommunicative.            Vital Signs: BP 94/71   Pulse 94   Temp (!) 97.5 F (36.4 C) (Axillary)   Resp 18   Ht '5\' 7"'$  (1.702 m)   Wt 129.5 kg   SpO2 97%   BMI 44.71 kg/m  SpO2: SpO2: 97 % O2 Device: O2 Device: High Flow Nasal Cannula O2 Flow Rate: O2 Flow Rate (L/min): 7 L/min  Intake/output summary:   Intake/Output Summary (Last 24 hours) at 04/30/2020 1005 Last data filed at 04/28/2020 0900 Gross per 24 hour  Intake 1206.95 ml  Output 6310 ml  Net -5103.05 ml   LBM: Last BM Date: 04/17/20 Baseline  Weight: Weight: (!) 149.2 kg Most recent weight: Weight: 129.5 kg       Palliative Assessment/Data: PPS 20%  Patient Active Problem List   Diagnosis Date Noted  . Gram-negative sepsis ([place])   . Acute respiratory failure with hypoxia ([place])   . Pressure injury of skin 04/04/2020  . Acute exacerbation of CHF (congestive heart failure) ([place]) 03/23/2020  . Atrial fibrillation with RVR ([place]) 03/20/2020  . Duodenal ulcer   . AKI (acute kidney injury) ([place]) 12/10/2019  . Acute blood loss anemia 12/10/2019  . Gastrointestinal hemorrhage with melena 12/10/2019  . Elevated troponin   . Elevated INR   . CHF (congestive heart failure) ([place]) 12/05/2018  . Anemia   . Gram-negative bacteremia 02/12/2014  . Chills (without fever) 02/11/2014  . Acute liver failure 12/28/2013  . Acute on chronic heart failure ([place]) 12/28/2013  . A-fib ([place]) 03/07/2013  . HTN (hypertension) 02/16/2013  . Chronic combined systolic and diastolic heart failure ([place]) 02/01/2013  . Anticoagulated on Coumadin 01/15/2013  . Hyposmolality and/or hyponatremia 03/04/2012  . OSA on CPAP 03/04/2012  . Acute on chronic combined systolic and diastolic heart failure, NYHA class 4 ([place]) 02/28/2012  . Chest pressure 02/28/2012  . Type 2 diabetes mellitus with hyperlipidemia ([place]) 08/04/2008  . ATRIAL FIBRILLATION WITH RAPID VENTRICULAR RESPONSE 08/04/2008  . UTI 08/04/2008  . CELLULITIS, LEGS 08/04/2008  . OTHER ASCITES 08/04/2008    Palliative Care Assessment & Plan   Patient Profile: 70 y.o. male  with past medical history of morbid obesity, advanced OSA, NICM,Afib, CHF (recent echo EF 20-25% mod MR, severe TR), CKD3 and DM who was admitted on 03/13/2020 with an increased SOB and weight by 30 - 50 lbs.  He was found to have a heart failure exacerbation. He was in Afib with RVR.  He was admitted and diuresed.  His kidney function declined precipitously and he is receiving CRRT.  He is a chronic CO2 retainer and has  difficulty with elevated CO2 levels and the inability to tolerate BiPAP.  This has worsened as his kidneys can not longer compensate for the elevations in CO2.  2/14 Patient remains critically ill on CRRT, norepinephrine and dobutamine gtts. Anuria, lethargy. Episode of agitation on night shift requiring prn ativan. 7L HFNC.   Assessment: Acute on chronic systolic heart failure with EF 25-30% AKI on CKD stage IIIb now ESRD on CRRT Anuria Persistent afib Gram-negative (serratia marcescens) sepsis Acute on chronic hypoxic respiratory failure OSA Venous stasis wounds Type 2 DM Hypervolemic hyponatremia Anemia/Hx of GIB Altered mental status  Recommendations/Plan: F/u with patient and documented HCPOA's (friend Raymond Pratt and daughter Raymond Pratt). HCPOA's understanding severity of his condition and poor prognosis. Patient drowsy.  Reviewed documented living will. Patient does not desire life-prolonging measures if terminal or incurable. Allow family to visit this afternoon. Initiate comfort meds as needed.  PMT provider will f/u with HCPOA's at 1630. They will likely be ready for full shift to comfort and discontinuation of CRRT/continuous infusions. I have prepared Raymond Pratt and [place] of poor prognosis, especially once these interventions are discontinued.   ADDENDUM: F/u with HCPOA's. Family has visited. They are ready for shift to comfort measures. Discontinue CRRT, continuous infusions, and other interventions not aimed at comfort. Cardiac monitor to comfort screen. Allow unrestricted visitor access. Comfort meds on MAR. Scheduled low-dose IV dilaudid, ativan, and robinul. Will place sign and held order for dilaudid infusion as bedside RN is concerned he may become more symptomatic this evening. Nursing discretion on need to start dilaudid infusion. Family is prepared for poor prognosis and likely hospital death. Family is ok with transfer to [facility] if  necessary. Appreciate chaplain visit today.   Comfort feeds per patient/family request.   Code Status: DNR/DNI   Code Status Orders  (From admission, onward)         Start     Ordered   04/18/20 2100  Do not attempt resuscitation (DNR)  Continuous       Question Answer Comment  In the event of cardiac or respiratory ARREST Do not call a "code blue"   In the event of cardiac or respiratory ARREST Do not perform Intubation, CPR, defibrillation or ACLS   In the event of cardiac or respiratory ARREST Use medication by any route, position, wound care, and other measures to relive pain and suffering. May use oxygen, suction and manual treatment of airway obstruction as needed for comfort.      04/18/20 2100        Code Status History    Date Active Date Inactive Code Status Order ID Comments User Context   04/02/2020 2243 04/18/2020 2100 Full Code [id number]  Marcelyn Bruins, MD ED   12/10/2019 0322 12/13/2019 1915 Full Code [id number]  Etta Quill, DO ED   12/11/2018 1222 12/14/2018 1640 DNR [id number]  Kayleen Memos, DO Inpatient   12/05/2018 2053 12/11/2018 1222 Full Code [id number]  Orene Desanctis, DO ED   02/18/2014 1613 02/18/2014 2046 Full Code [id number]  Aileen Fass, MD Inpatient   12/28/2013 1150 01/07/2014 1727 Full Code [id number]  Precious Reel, MD Inpatient   01/15/2013 1116 01/20/2013 1635 Full Code [id number]  Lonn Georgia, PA-C ED   02/28/2012 1418 03/08/2012 1558 Full Code [id number]  Precious Reel, MD Inpatient   Advance Care Planning Activity    Advance Directive Documentation   Flowsheet Row Most Recent Value  Type of Advance Directive Healthcare Power of Attorney  Pre-existing out of facility DNR order (yellow form or pink MOST form) --  "MOST" Form in Place? --      Prognosis: Poor prognosis: likely days if not hours once full transition to comfort measures.  Discharge Planning: Anticipated Hospital Death once transition to comfort measures  Care plan was discussed with RN, Dr. Haroldine Laws, HCPOA's  Raymond Pratt and Raymond Pratt)  Thank you for allowing the Palliative Medicine Team to assist in the care of this patient.   Time In: 1210- 1640- Time Out: 1240 1710 Total Time 60 Prolonged Time Billed no      Greater than 50%  of this time was spent counseling and coordinating care related to the above assessment and plan.  Ihor Dow, DNP, FNP-C Palliative Medicine Team  Phone: [phone] Fax: [phone]  Please contact Palliative Medicine Team phone at [phone] for questions and concerns.

## 2020-04-21 NOTE — Progress Notes (Signed)
PROGRESS NOTE    Raymond Pratt  [id number] DOB: [birth year] DOA: 04/02/2020 PCP: Shon Baton, MD    Chief Complaint  Patient presents with   Shortness of Breath    CHF    Brief Narrative:  70 year old gentleman with prior history of chronic systolic and diastolic heart failure, atrial fibrillation on anticoagulation, hypertension, obstructive sleep apnea on CPAP, diabetes mellitus, coronary artery disease presents to ED with worsening shortness of breath.  On arrival to ED was found to be hypoxic requiring up to 4 L of nasal cannula oxygen.  Chest x-ray showed pulmonary vascular congestion. He was admitted for acute hypoxic respiratory failure secondary to acute on chronic combined CHF. He is currently on 4 lit of [place] oxygen to keep sats greater than 90%. Cardiology on board and started him on IV milrinone.  Hospital course complicated by worsening renal parameters, nephrology consulted, unfortunately he is not a candidate for outpatient dialysis. Pt understands and agreed for short term HD/ CRRT for 48 hours . If no improvement  In renal parameters after CRRT for 48 hours, nephrology recommends comfort measures as he is not a candidate for outpatient dialysis. Pt seen and examined, he is sleepy, has temp of 97 and is on warmer. His BP has been low 88/50 mmhg. Levo phed started by cardiology.  Poor prognosis , he is  Clinically declining despite aggressive measures with low cardiac output, hypotension, anuric, delirious, hypothermic,  Acute respiratory failure with respiratory acidosis with hypercapnia ( unable to tolerate biPAP), despite dobutamine gtt, levo gtt, CRRT . Palliative meeting today and family has transitioned to comfort measures. Multiple family members in the room.    Assessment & Plan:   Principal Problem:   Acute exacerbation of CHF (congestive heart failure) (HCC) Active Problems:   Type 2 diabetes mellitus with hyperlipidemia (HCC)   OSA on CPAP    Anticoagulated on Coumadin   HTN (hypertension)   A-fib (HCC)   Anemia   Atrial fibrillation with RVR (HCC)   Pressure injury of skin   Acute respiratory failure with hypoxia (HCC)   Gram-negative sepsis (HCC)   Acute hypoxic and hypercapnic respiratory failure secondary to acute on chronic systolic and diastolic heart failure requiring up to 5 lit of [place] oxygen to keep sats greater than 90%.not able to tolerate BIPAP Chest x-ray on admission shows pulmonary vascular congestion and perihilar edema. Echocardiogram shows left ventricular ejection fraction of 45 to 50% with hypokinesis, dilated right ventricle and right atrium, moderate MR moderate to severe TR.  Cardiology on board , started him on IV Milrinone, transition to IV dobutamine along with high-dose IV Lasix, transferred to ICU on to their service for CRRT. Despite aggressive measure pt continued to decline and he was transitioned to comfort measures.     Persistent atrial fibrillation Rate not well controlled and currently on IV amiodarone.  Patient will need TEE DCCV once bacteremia/infection clears.     Sepsis in the setting of Serratia bacteremia Blood cultures positive for GNR. Completed the course of antibiotics.  Repeat BP cultures have been negative so far.   Anemia of chronic disease from CKD   Hyponatremia  From CKD and fluid overload   Diabetes mellitus with hyperglycemia Hemoglobin A1c around 12.4. CBG (last 3)  Recent Labs    04/20/20 2156 04/17/2020 0655 04/15/2020 1116  GLUCAP 210* 212* 135*       Body mass index is 44.71 kg/m. Morbid obesity Poor prognostic factor   Pressure injury stage II  pressure injury present on right buttocks,  present on admission:  Pressure Injury 04/04/20 Heel Left Stage 3 -  Full thickness tissue loss. Subcutaneous fat may be visible but bone, tendon or muscle are NOT exposed. (Active)  04/04/20   Location: Heel  Location Orientation: Left  Staging: Stage 3 -   Full thickness tissue loss. Subcutaneous fat may be visible but bone, tendon or muscle are NOT exposed.  Wound Description (Comments):   Present on Admission: Yes     Pressure Injury 04/04/20 Left Stage 3 -  Full thickness tissue loss. Subcutaneous fat may be visible but bone, tendon or muscle are NOT exposed. (Active)  04/04/20   Location:   Location Orientation: Left  Staging: Stage 3 -  Full thickness tissue loss. Subcutaneous fat may be visible but bone, tendon or muscle are NOT exposed.  Wound Description (Comments):   Present on Admission: Yes     Pressure Injury 04/04/20 Buttocks Right Stage 2 -  Partial thickness loss of dermis presenting as a shallow open injury with a red, pink wound bed without slough. (Active)  04/04/20   Location: Buttocks  Location Orientation: Right  Staging: Stage 2 -  Partial thickness loss of dermis presenting as a shallow open injury with a red, pink wound bed without slough.  Wound Description (Comments):   Present on Admission: Yes   Wound care consulted and recommendations given.   Acute on chronic stage IIIb CKD Baseline creatinine around 1.7, creatinine increased to greater than 2.6 to 3.22 to 4.02 to 4.4. TO 2.63. ANURIC at this time.  Holding Aldactone, Entresto at this time.  Nephrology consulted unfortunately he is not a candidate for outpatient dialysis. Pt understands and agreed for short term HD/ CRRT for 48 hours . If no improvement  In renal parameters after CRRT for 48 hours, nephrology recommends comfort measures as he is not a candidate for outpatient dialysis. NO improvement with CRRT. And he was transitioned to comfort measures.  US renal  Shows Normal renal ultrasound.    Chronic bilateral venous stasis dermatitis     Lung nodule noted on the CT scan     Hypokalemia and hypomagnesemia Replaced   History of coronary artery disease     DVT prophylaxis: Heparin Code Status: Full code.  Family Communication:   Multiple family members at bedside.  Disposition:      Procedures:  Antimicrobials: Antibiotics Given (last 72 hours)    Date/Time Action Medication Dose Rate   04/18/20 1719 New Bag/Given   ceFEPIme (MAXIPIME) 2 g in sodium chloride 0.9 % 100 mL IVPB 2 g 200 mL/hr   04/19/20 0951 New Bag/Given   ceFEPIme (MAXIPIME) 2 g in sodium chloride 0.9 % 100 mL IVPB 2 g 200 mL/hr   04/19/20 2133 New Bag/Given   ceFEPIme (MAXIPIME) 2 g in sodium chloride 0.9 % 100 mL IVPB 2 g 200 mL/hr   04/20/20 0916 New Bag/Given   ceFEPIme (MAXIPIME) 2 g in sodium chloride 0.9 % 100 mL IVPB 2 g 200 mL/hr   04/20/20 2119 New Bag/Given   ceFEPIme (MAXIPIME) 2 g in sodium chloride 0.9 % 100 mL IVPB 2 g 200 mL/hr   04/29/2020 1015 New Bag/Given   ceFEPIme (MAXIPIME) 2 g in sodium chloride 0.9 % 100 mL IVPB 2 g 200 mL/hr       Subjective: Confused,.   Objective: Vitals:   05/05/2020 1113 04/26/2020 1200 04/30/2020 1215 04/08/2020 1230  BP:  109/83 127/90 123/69  Pulse:  Marland Kitchen)  102 99 100  Resp:  '18 20 20  '$ Temp: 97.7 F (36.5 C)     TempSrc: Axillary     SpO2:  100% 99% 97%  Weight:      Height:        Intake/Output Summary (Last 24 hours) at 04/09/2020 1314 Last data filed at 04/11/2020 1200 Gross per 24 hour  Intake 1329.31 ml  Output 5944 ml  Net -4614.69 ml   Filed Weights   04/19/20 0405 04/20/20 0625 04/09/2020 0615  Weight: (!) 140.9 kg 135.9 kg 129.5 kg    Examination:  General exam: Ill-appearing gentleman on [place] oxygen.  Respiratory system: diminished air entry throught out the lungs.  cardiovascular system: S1-S2 heard,tachycardic, irregularly irregular.  Gastrointestinal system: Abdomen is soft Central nervous system: delirious.  Extremities:2+ LE edmea.  Skin: Pressure injury on the right buttocks Psychiatry: cannot be assessed.    Data Reviewed: I have personally reviewed following labs and imaging studies  CBC: Recent Labs  Lab 04/14/20 1433 04/15/20 0429 04/17/20 0413  04/17/20 1700 04/18/20 0411 04/19/20 0030 04/19/20 0422 04/20/20 0227 04/20/20 0351 04/20/20 0403  WBC 8.3   < > 11.0* 13.3* 11.6*  --  11.0*  --   --  12.0*  NEUTROABS 6.5  --   --   --   --   --   --   --   --   --   HGB 10.0*   < > 7.7* 8.2* 7.2* 7.8* 8.0* 10.5* 10.9* 8.5*  HCT 34.7*   < > 25.3* 26.6* 23.9* 25.6* 26.4* 31.0* 32.0* 28.5*  MCV 82.4   < > 79.6* 78.0* 78.4*  --  78.6*  --   --  81.2  PLT 399   < > 330 419* 382  --  377  --   --  393   < > = values in this interval not displayed.    Basic Metabolic Panel: Recent Labs  Lab 04/15/20 0429 04/15/20 0929 04/16/20 0500 04/16/20 1136 04/19/20 0422 04/19/20 1535 04/20/20 0227 04/20/20 0351 04/20/20 0403 04/20/20 0404 04/20/20 1523 04/20/2020 0413  NA 129*   < >  --    < > 126* 129* 133* 133*  --  128* 130* 132*  K 4.8   < >  --    < > 4.5 4.7 4.9 5.3*  --  5.0 5.0 5.1  CL 89*   < >  --    < > 90* 92*  --   --   --  91* 96* 96*  CO2 29   < >  --    < > 24 25  --   --   --  '26 24 25  '$ GLUCOSE 215*   < >  --    < > 349* 300*  --   --   --  271* 252* 215*  BUN 46*   < >  --    < > 63* 49*  --   --   --  37* 29* 25*  CREATININE 2.07*   < >  --    < > 3.76* 3.16*  --   --   --  2.63* 2.30* 2.27*  CALCIUM 8.4*   < >  --    < > 8.2* 8.2*  --   --   --  8.7* 8.3* 8.6*  MG 2.4  --  2.6*  --  2.7*  --   --   --  2.8*  --   --  2.7*  PHOS  --   --   --    < > 3.6 3.6  --   --   --  3.9 3.9 3.9   < > = values in this interval not displayed.    GFR: Estimated Creatinine Clearance: 40.3 mL/min (A) (by C-G formula based on SCr of 2.27 mg/dL (H)).  Liver Function Tests: Recent Labs  Lab 04/15/20 0429 04/18/20 1621 04/19/20 0422 04/19/20 1535 04/20/20 0404 04/20/20 1523 04/16/2020 0413  AST 28  --   --   --   --   --   --   ALT 22  --   --   --   --   --   --   ALKPHOS 71  --   --   --   --   --   --   BILITOT 1.0  --   --   --   --   --   --   PROT 6.4*  --   --   --   --   --   --   ALBUMIN 2.7*   < > 2.4* 2.5* 2.6*  2.6* 2.7*   < > = values in this interval not displayed.    CBG: Recent Labs  Lab 04/20/20 1545 04/20/20 2008 04/20/20 2156 04/25/2020 0655 04/15/2020 1116  GLUCAP 100* 201* 210* 212* 135*     Recent Results (from the past 240 hour(s))  Culture, blood (routine x 2)     Status: Abnormal   Collection Time: 04/14/20  9:37 AM   Specimen: BLOOD  Result Value Ref Range Status   Specimen Description BLOOD RIGHT ANTECUBITAL  Final   Special Requests   Final    BOTTLES DRAWN AEROBIC AND ANAEROBIC Blood Culture results may not be optimal due to an inadequate volume of blood received in culture bottles Performed at [facility] [address] [place], [place] [address]    Culture  Setup Time   Final    CORRECTED RESULTS GRAM NEGATIVE RODS PREVIOUSLY REPORTED AS: GRAM NEGATIVE COCCI CORRECTED RESULTS CALLED TO: PHARMD LYDIA CHEN [id number] FCP    Culture SERRATIA MARCESCENS (A)  Final   Report Status 04/17/2020 FINAL  Final   Organism ID, Bacteria SERRATIA MARCESCENS  Final      Susceptibility   Serratia marcescens - MIC*    CEFAZOLIN >=64 RESISTANT Resistant     CEFEPIME <=0.12 SENSITIVE Sensitive     CEFTAZIDIME <=1 SENSITIVE Sensitive     CEFTRIAXONE <=0.25 SENSITIVE Sensitive     CIPROFLOXACIN <=0.25 SENSITIVE Sensitive     GENTAMICIN <=1 SENSITIVE Sensitive     TRIMETH/SULFA <=20 SENSITIVE Sensitive     * SERRATIA MARCESCENS  Blood Culture ID Panel (Reflexed)     Status: Abnormal   Collection Time: 04/14/20  9:37 AM  Result Value Ref Range Status   Enterococcus faecalis NOT DETECTED NOT DETECTED Final   Enterococcus Faecium NOT DETECTED NOT DETECTED Final   Listeria monocytogenes NOT DETECTED NOT DETECTED Final   Staphylococcus species NOT DETECTED NOT DETECTED Final   Staphylococcus aureus (BCID) NOT DETECTED NOT DETECTED Final   Staphylococcus epidermidis NOT DETECTED NOT DETECTED Final   Staphylococcus lugdunensis NOT DETECTED NOT DETECTED Final   Streptococcus  species NOT DETECTED NOT DETECTED Final   Streptococcus agalactiae NOT DETECTED NOT DETECTED Final   Streptococcus pneumoniae NOT DETECTED NOT DETECTED Final   Streptococcus pyogenes NOT DETECTED NOT DETECTED Final   A.calcoaceticus-baumannii NOT DETECTED NOT  DETECTED Final   Bacteroides fragilis NOT DETECTED NOT DETECTED Final   Enterobacterales DETECTED (A) NOT DETECTED Final    Comment: Enterobacterales represent a large order of gram negative bacteria, not a single organism. CRITICAL RESULT CALLED TO, READ BACK BY AND VERIFIED WITH: [place] ON  04/15/2020    Enterobacter cloacae complex NOT DETECTED NOT DETECTED Final   Escherichia coli NOT DETECTED NOT DETECTED Final   Klebsiella aerogenes NOT DETECTED NOT DETECTED Final   Klebsiella oxytoca NOT DETECTED NOT DETECTED Final   Klebsiella pneumoniae NOT DETECTED NOT DETECTED Final   Proteus species NOT DETECTED NOT DETECTED Final   Salmonella species NOT DETECTED NOT DETECTED Final   Serratia marcescens DETECTED (A) NOT DETECTED Final    Comment: CRITICAL RESULT CALLED TO, READ BACK BY AND VERIFIED WITH: PHARMD AMEND C. BY MESSAN H. AT 0320 ON  04/15/2020    Haemophilus influenzae NOT DETECTED NOT DETECTED Final   Neisseria meningitidis NOT DETECTED NOT DETECTED Final   Pseudomonas aeruginosa NOT DETECTED NOT DETECTED Final   Stenotrophomonas maltophilia NOT DETECTED NOT DETECTED Final   Candida albicans NOT DETECTED NOT DETECTED Final   Candida auris NOT DETECTED NOT DETECTED Final   Candida glabrata NOT DETECTED NOT DETECTED Final   Candida krusei NOT DETECTED NOT DETECTED Final   Candida parapsilosis NOT DETECTED NOT DETECTED Final   Candida tropicalis NOT DETECTED NOT DETECTED Final   Cryptococcus neoformans/gattii NOT DETECTED NOT DETECTED Final   CTX-M ESBL NOT DETECTED NOT DETECTED Final   Carbapenem resistance IMP NOT DETECTED NOT DETECTED Final   Carbapenem resistance KPC NOT DETECTED NOT  DETECTED Final   Carbapenem resistance NDM NOT DETECTED NOT DETECTED Final   Carbapenem resist OXA 48 LIKE NOT DETECTED NOT DETECTED Final   Carbapenem resistance VIM NOT DETECTED NOT DETECTED Final    Comment: Performed at [facility], [address], [place], [place] [address]  Culture, blood (routine x 2)     Status: Abnormal   Collection Time: 04/14/20  9:42 AM   Specimen: BLOOD RIGHT HAND  Result Value Ref Range Status   Specimen Description BLOOD RIGHT HAND  Final   Special Requests   Final    BOTTLES DRAWN AEROBIC AND ANAEROBIC Blood Culture results may not be optimal due to an inadequate volume of blood received in culture bottles   Culture  Setup Time   Final    CORRECTED RESULTS GRAM NEGATIVE RODS PREVIOUSLY REPORTED AS: GRAM NEGATIVE COCCI CORRECTED RESULTS CALLED TO: PHARMD LYDIA CHEN [id number] FCP    Culture (A)  Final    SERRATIA MARCESCENS SUSCEPTIBILITIES PERFORMED ON PREVIOUS CULTURE WITHIN THE LAST 5 DAYS. Performed at [facility], [place] [address] [place], [place] [address]    Report Status 04/17/2020 FINAL  Final  Culture, Urine     Status: Abnormal   Collection Time: 04/17/20  1:05 PM   Specimen: Urine, Random  Result Value Ref Range Status   Specimen Description URINE, RANDOM  Final   Special Requests NONE  Final   Culture (A)  Final    <10,000 COLONIES/mL INSIGNIFICANT GROWTH Performed at [facility], [address], [place], [place] [address]    Report Status 04/18/2020 FINAL  Final  Culture, blood (Routine X 2) w Reflex to ID Panel     Status: None (Preliminary result)   Collection Time: 04/17/20  2:16 PM   Specimen: BLOOD RIGHT HAND  Result Value  Ref Range Status   Specimen Description BLOOD RIGHT HAND  Final   Special Requests   Final    BOTTLES DRAWN AEROBIC AND ANAEROBIC Blood Culture adequate volume   Culture   Final    NO GROWTH 3 DAYS Performed at [facility], [address], [place], [place] [address]    Report  Status PENDING  Incomplete  Culture, blood (Routine X 2) w Reflex to ID Panel     Status: None (Preliminary result)   Collection Time: 04/17/20  2:17 PM   Specimen: BLOOD  Result Value Ref Range Status   Specimen Description BLOOD RIGHT ANTECUBITAL  Final   Special Requests   Final    BOTTLES DRAWN AEROBIC AND ANAEROBIC Blood Culture results may not be optimal due to an inadequate volume of blood received in culture bottles   Culture   Final    NO GROWTH 3 DAYS Performed at [facility], [place] [address], [place], [place] [address]    Report Status PENDING  Incomplete  MRSA PCR Screening     Status: None   Collection Time: 04/18/20  2:39 PM   Specimen: Nasopharyngeal  Result Value Ref Range Status   MRSA by PCR NEGATIVE NEGATIVE Final    Comment:        The GeneXpert MRSA Assay (FDA approved for NASAL specimens only), is one component of a comprehensive MRSA colonization surveillance program. It is not intended to diagnose MRSA infection nor to guide or monitor treatment for MRSA infections. Performed at [facility], [place] [address] [place], [place] [address]          Radiology Studies: No results found.      Scheduled Meds:  sodium chloride   Intravenous Once   busPIRone  7.5 mg Oral BID   Chlorhexidine Gluconate Cloth  6 each Topical Daily   clonazePAM  1 mg Oral BID   insulin aspart  0-15 Units Subcutaneous TID WC   insulin glargine  45 Units Subcutaneous BID   melatonin  3 mg Oral QHS   sodium chloride flush  3 mL Intravenous Q12H   Continuous Infusions:  sodium chloride Stopped (04/17/20 1931)   amiodarone 30 mg/hr (04/09/2020 1200)   ceFEPime (MAXIPIME) IV Stopped (04/18/2020 1047)   DOBUTamine 2.5 mcg/kg/min (04/24/2020 1200)   [START ON 04/23/2020] ferumoxytol     heparin 1,750 Units/hr (04/24/2020 1200)   norepinephrine (LEVOPHED) Adult infusion 6 mcg/min (04/20/2020 1200)   prismasol BGK 2/2.5 replacement solution 500 mL/hr at  04/28/2020 0247   prismasol BGK 2/2.5 replacement solution 300 mL/hr at 04/09/2020 0556   prismasol BGK 4/2.5 1,500 mL/hr at 04/23/2020 1133     LOS: 18 days       Hosie Poisson, MD [facility]   To contact the attending provider between 7A-7P or the covering provider during after hours 7P-7A, please log into the web site [email or web address] and access using universal [place] password for that web site. If you do not have the password, please call the hospital operator.  04/10/2020, 1:14 PM

## 2020-04-23 LAB — CULTURE, BLOOD (ROUTINE X 2)
Culture: NO GROWTH
Culture: NO GROWTH
Special Requests: ADEQUATE

## 2020-05-06 NOTE — Progress Notes (Signed)
45 mL dilaudid wasted and witnessed with Shelby Dubin, RN.

## 2020-05-06 NOTE — Progress Notes (Signed)
TOD declared by Arlyss Gandy, NP at 2150. Confirmed by auscultation. Heart rhythm is asystole- rhythm strip printed and placed on patient's hard chart.   Family at bedside and notified of patient's death. All belongings given to grand-daughter.   Post-mortem check list completed and notified patient placement. All invasive lines removed per protocol and patient transported down to morgue. Family given grief packet and number for patient placement department.

## 2020-05-06 DEATH — deceased

## 2020-06-06 NOTE — Discharge Summary (Addendum)
  Advanced Heart Failure Death Summary  Death Summary   Patient ID: Raymond Pratt MRN: [id number], DOB/AGE: 03-18-1950 70 y.o. Admit date: 04/30/20 Time of Death:  05/18/2020 @ 20-Jun-2148   Primary Discharge Diagnoses:  1. A/C Systolic Heart Failure 2. Persistent A fib, A fib RVR  3. Shock--Cardiogenic/Sepsis Shock Gram-Negative (Seratia Marcescens) 4. A/C Hypoxic Respiratory Failure/OSA 5. AKI on CKD Stage IIIB-->ESRD 6. Venous Stasis Wounds LLE 7. DMII 8. Morbid Obesity  9. Lung Nodule 10. Hypokalemia  11. Hypomagnesemia 12. Hyponatremia 13. Anemia  14. Acute Encephalopathy-->hypercapnia  15. DNR/DNI   Hospital Course:  [name] was a 70 y.o. with a history fo chronic systolic heart failure , afib (on coumadin), anemia, hx of GIB, HTN, OSA on CPA, DM, CAD,and obesity.   Admitted by the [facility] with  A/C systolic heart failure and recurrent a fib. Echo completed on admit and showed LVEF 20-25%. Advanced Heart Failure Team consulted to assist with heart failure management. Diuresed with intermittent IV lasix but had poor response. PICC line was placed to guide diuresis and for CO-OX. CVP was > 20 and CO-OX < 50% so milrinone was started and switched to lasix drip. Also placed on amiodarone drip for A fib RVR.  Developed fever and hypotension. Blood cultures positive for [employer]. ID consulted and placed on cefepime.   Milrinone was increased due persistently low CO-OX. After developing hypotension he was switched to dobutamine drip. Nephrology consulted for worsening renal function. Dialysis was discussed on 2/10 but he was adamant he did not want to pursue. He was given high dose IV lasix and dobutamine but continued to worsen with respiratory failrue and ESRD. On 2/11 he was agreed to try short term CVVHD.   On 2/11 he was transferred to ICU for 48 hour trial of CVVHD. Started on CVVHD on 2/11.  Over the next 48 hour he developed AMS and was  placed on Bipap but did not tolerate so he was switched to HFNC. Due to persistent hypotension, norepinephrine was added.   Due to worsening condition Palliative Care consulted for goals of care. [name] previously completed his advanced directives which were very specific to not be placed on artificial support if he was near end of life.  On 2022/05/18 the family  requested he transition to comfort care.   Hospital course complicated by low output heart failure, shock, sepsis, AKI -->ESRD requiring dialysis, and hypoxic respiratory failure.   Despite every effort to improve his clinical condition he continued to decline with evidence of multisystem organ failure.  He passed with family at that bedside  on 2020-05-18.   Significant Diagnostic Studies/Procedures  04/04/20 EF 20-25% RV not well visualized.  04/17/20 Renal US - no hydronephrosis 04/18/20 Insertion Central Vesous Catheter for CVVHD    Consultants   Advanced Heart Failure Nephrology ID Palliative Care   Duration of Discharge Encounter: Greater than 35 minutes   Signed, Darrick Grinder, NP 05/14/2020, 6:30 PM  Agree with above.   [name], MD  3:38 PM
